# Patient Record
Sex: Female | Born: 1935 | Race: White | Hispanic: No | Smoking: Former smoker
Health system: Southern US, Community
[De-identification: ages and names within clinical notes are randomized; demographics above are authoritative.]

## PROBLEM LIST (undated history)

## (undated) ENCOUNTER — Ambulatory Visit: Discharge status: Absent without leave | Payer: BLUE CROSS/BLUE SHIELD

## (undated) DIAGNOSIS — C439 Malignant melanoma of skin, unspecified: Secondary | ICD-10-CM

## (undated) DIAGNOSIS — G4700 Insomnia, unspecified: Secondary | ICD-10-CM

## (undated) DIAGNOSIS — E119 Type 2 diabetes mellitus without complications: Secondary | ICD-10-CM

## (undated) DIAGNOSIS — D649 Anemia, unspecified: Secondary | ICD-10-CM

## (undated) DIAGNOSIS — C801 Malignant (primary) neoplasm, unspecified: Secondary | ICD-10-CM

## (undated) DIAGNOSIS — I340 Nonrheumatic mitral (valve) insufficiency: Secondary | ICD-10-CM

## (undated) DIAGNOSIS — I5030 Unspecified diastolic (congestive) heart failure: Secondary | ICD-10-CM

## (undated) DIAGNOSIS — E785 Hyperlipidemia, unspecified: Secondary | ICD-10-CM

## (undated) DIAGNOSIS — I809 Phlebitis and thrombophlebitis of unspecified site: Secondary | ICD-10-CM

## (undated) DIAGNOSIS — I639 Cerebral infarction, unspecified: Secondary | ICD-10-CM

## (undated) DIAGNOSIS — R011 Cardiac murmur, unspecified: Secondary | ICD-10-CM

## (undated) DIAGNOSIS — R112 Nausea with vomiting, unspecified: Secondary | ICD-10-CM

## (undated) DIAGNOSIS — Z9841 Cataract extraction status, right eye: Secondary | ICD-10-CM

## (undated) DIAGNOSIS — I700 Atherosclerosis of aorta: Secondary | ICD-10-CM

## (undated) DIAGNOSIS — N3281 Overactive bladder: Secondary | ICD-10-CM

## (undated) DIAGNOSIS — C44319 Basal cell carcinoma of skin of other parts of face: Secondary | ICD-10-CM

## (undated) DIAGNOSIS — I5189 Other ill-defined heart diseases: Secondary | ICD-10-CM

## (undated) DIAGNOSIS — I10 Essential (primary) hypertension: Secondary | ICD-10-CM

## (undated) DIAGNOSIS — I350 Nonrheumatic aortic (valve) stenosis: Secondary | ICD-10-CM

## (undated) DIAGNOSIS — C189 Malignant neoplasm of colon, unspecified: Secondary | ICD-10-CM

## (undated) DIAGNOSIS — I5032 Chronic diastolic (congestive) heart failure: Secondary | ICD-10-CM

## (undated) DIAGNOSIS — Z9889 Other specified postprocedural states: Secondary | ICD-10-CM

## (undated) DIAGNOSIS — I6529 Occlusion and stenosis of unspecified carotid artery: Secondary | ICD-10-CM

## (undated) DIAGNOSIS — C3490 Malignant neoplasm of unspecified part of unspecified bronchus or lung: Secondary | ICD-10-CM

## (undated) DIAGNOSIS — K921 Melena: Secondary | ICD-10-CM

## (undated) DIAGNOSIS — I779 Disorder of arteries and arterioles, unspecified: Secondary | ICD-10-CM

## (undated) DIAGNOSIS — K589 Irritable bowel syndrome without diarrhea: Secondary | ICD-10-CM

## (undated) DIAGNOSIS — D126 Benign neoplasm of colon, unspecified: Secondary | ICD-10-CM

## (undated) HISTORY — DX: Irritable bowel syndrome without diarrhea: K58.9

## (undated) HISTORY — PX: BREAST BIOPSY: SHX20

## (undated) HISTORY — PX: MELANOMA EXCISION: SHX5266

## (undated) HISTORY — DX: Hyperlipidemia, unspecified: E78.5

## (undated) HISTORY — DX: Malignant neoplasm of unspecified part of unspecified bronchus or lung: C34.90

## (undated) HISTORY — PX: SKIN SURGERY: SHX2413

## (undated) HISTORY — PX: BACK SURGERY: SHX140

## (undated) HISTORY — DX: Essential (primary) hypertension: I10

## (undated) HISTORY — DX: Malignant melanoma of skin, unspecified: C43.9

## (undated) HISTORY — PX: APPENDECTOMY: SHX54

## (undated) HISTORY — DX: Type 2 diabetes mellitus without complications: E11.9

## (undated) HISTORY — PX: BREAST SURGERY: SHX581

---
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
## 2004-12-29 ENCOUNTER — Ambulatory Visit: Payer: Self-pay | Admitting: Unknown Physician Specialty

## 2005-03-24 ENCOUNTER — Ambulatory Visit: Payer: Self-pay | Admitting: Family Medicine

## 2006-03-30 ENCOUNTER — Ambulatory Visit: Payer: Self-pay | Admitting: Family Medicine

## 2007-04-02 ENCOUNTER — Other Ambulatory Visit: Payer: Self-pay

## 2007-04-02 ENCOUNTER — Inpatient Hospital Stay: Payer: Self-pay | Admitting: Internal Medicine

## 2007-04-02 DIAGNOSIS — I6350 Cerebral infarction due to unspecified occlusion or stenosis of unspecified cerebral artery: Secondary | ICD-10-CM

## 2007-04-02 HISTORY — DX: Cerebral infarction due to unspecified occlusion or stenosis of unspecified cerebral artery: I63.50

## 2007-05-05 ENCOUNTER — Ambulatory Visit: Payer: Self-pay | Admitting: Family Medicine

## 2007-09-06 ENCOUNTER — Ambulatory Visit: Payer: Self-pay | Admitting: Family Medicine

## 2007-09-17 ENCOUNTER — Ambulatory Visit: Payer: Self-pay | Admitting: Family Medicine

## 2007-10-17 ENCOUNTER — Ambulatory Visit: Payer: Self-pay | Admitting: Family Medicine

## 2007-11-17 ENCOUNTER — Ambulatory Visit: Payer: Self-pay | Admitting: Family Medicine

## 2008-05-08 ENCOUNTER — Ambulatory Visit: Payer: Self-pay | Admitting: Family Medicine

## 2008-05-16 ENCOUNTER — Ambulatory Visit: Payer: Self-pay | Admitting: Internal Medicine

## 2008-06-08 ENCOUNTER — Ambulatory Visit: Payer: Self-pay | Admitting: Internal Medicine

## 2008-06-16 ENCOUNTER — Ambulatory Visit: Payer: Self-pay | Admitting: Internal Medicine

## 2008-06-21 ENCOUNTER — Ambulatory Visit: Payer: Self-pay | Admitting: Otolaryngology

## 2008-06-21 ENCOUNTER — Other Ambulatory Visit: Payer: Self-pay

## 2008-06-27 ENCOUNTER — Ambulatory Visit: Payer: Self-pay | Admitting: Otolaryngology

## 2008-07-05 ENCOUNTER — Ambulatory Visit: Payer: Self-pay | Admitting: Internal Medicine

## 2008-07-17 ENCOUNTER — Ambulatory Visit: Payer: Self-pay | Admitting: Internal Medicine

## 2008-08-16 ENCOUNTER — Ambulatory Visit: Payer: Self-pay | Admitting: Internal Medicine

## 2008-08-30 ENCOUNTER — Ambulatory Visit: Payer: Self-pay | Admitting: Internal Medicine

## 2008-09-16 ENCOUNTER — Ambulatory Visit: Payer: Self-pay | Admitting: Internal Medicine

## 2008-11-16 ENCOUNTER — Ambulatory Visit: Payer: Self-pay | Admitting: Internal Medicine

## 2008-11-29 ENCOUNTER — Ambulatory Visit: Payer: Self-pay | Admitting: Internal Medicine

## 2008-12-17 ENCOUNTER — Ambulatory Visit: Payer: Self-pay | Admitting: Internal Medicine

## 2008-12-25 ENCOUNTER — Ambulatory Visit: Payer: Self-pay | Admitting: Family Medicine

## 2009-05-07 ENCOUNTER — Ambulatory Visit: Payer: Self-pay | Admitting: Unknown Physician Specialty

## 2009-05-09 ENCOUNTER — Ambulatory Visit: Payer: Self-pay | Admitting: Family Medicine

## 2009-09-26 ENCOUNTER — Emergency Department: Payer: Self-pay | Admitting: Emergency Medicine

## 2009-10-07 ENCOUNTER — Ambulatory Visit: Payer: Self-pay | Admitting: Family Medicine

## 2010-03-03 ENCOUNTER — Ambulatory Visit: Payer: Self-pay | Admitting: Family Medicine

## 2010-05-13 ENCOUNTER — Ambulatory Visit: Payer: Self-pay | Admitting: Family Medicine

## 2010-11-16 HISTORY — PX: EYE SURGERY: SHX253

## 2010-11-16 HISTORY — PX: CATARACT EXTRACTION: SUR2

## 2011-05-25 ENCOUNTER — Ambulatory Visit: Payer: Self-pay | Admitting: Ophthalmology

## 2011-06-15 ENCOUNTER — Ambulatory Visit: Payer: Self-pay | Admitting: Family Medicine

## 2011-06-16 ENCOUNTER — Ambulatory Visit: Payer: Self-pay | Admitting: Ophthalmology

## 2011-09-29 ENCOUNTER — Ambulatory Visit: Payer: Self-pay | Admitting: Family Medicine

## 2011-10-21 ENCOUNTER — Ambulatory Visit: Payer: Self-pay | Admitting: Family Medicine

## 2011-10-22 ENCOUNTER — Ambulatory Visit: Payer: Self-pay | Admitting: Family Medicine

## 2011-11-30 DIAGNOSIS — R1031 Right lower quadrant pain: Secondary | ICD-10-CM | POA: Diagnosis not present

## 2012-01-11 DIAGNOSIS — I10 Essential (primary) hypertension: Secondary | ICD-10-CM | POA: Diagnosis not present

## 2012-01-11 DIAGNOSIS — I6350 Cerebral infarction due to unspecified occlusion or stenosis of unspecified cerebral artery: Secondary | ICD-10-CM | POA: Diagnosis not present

## 2012-01-11 DIAGNOSIS — G4700 Insomnia, unspecified: Secondary | ICD-10-CM | POA: Diagnosis not present

## 2012-01-28 DIAGNOSIS — H903 Sensorineural hearing loss, bilateral: Secondary | ICD-10-CM | POA: Diagnosis not present

## 2012-01-28 DIAGNOSIS — H6120 Impacted cerumen, unspecified ear: Secondary | ICD-10-CM | POA: Diagnosis not present

## 2012-02-29 DIAGNOSIS — Z0000 Encounter for general adult medical examination without abnormal findings: Secondary | ICD-10-CM | POA: Diagnosis not present

## 2012-02-29 DIAGNOSIS — E785 Hyperlipidemia, unspecified: Secondary | ICD-10-CM | POA: Diagnosis not present

## 2012-02-29 DIAGNOSIS — G4700 Insomnia, unspecified: Secondary | ICD-10-CM | POA: Diagnosis not present

## 2012-02-29 DIAGNOSIS — I10 Essential (primary) hypertension: Secondary | ICD-10-CM | POA: Diagnosis not present

## 2012-02-29 DIAGNOSIS — E119 Type 2 diabetes mellitus without complications: Secondary | ICD-10-CM | POA: Diagnosis not present

## 2012-03-15 ENCOUNTER — Ambulatory Visit: Payer: Self-pay | Admitting: Family Medicine

## 2012-03-15 DIAGNOSIS — M810 Age-related osteoporosis without current pathological fracture: Secondary | ICD-10-CM | POA: Diagnosis not present

## 2012-03-25 DIAGNOSIS — R748 Abnormal levels of other serum enzymes: Secondary | ICD-10-CM | POA: Diagnosis not present

## 2012-04-27 DIAGNOSIS — R110 Nausea: Secondary | ICD-10-CM | POA: Diagnosis not present

## 2012-04-27 DIAGNOSIS — R12 Heartburn: Secondary | ICD-10-CM | POA: Diagnosis not present

## 2012-04-27 DIAGNOSIS — R634 Abnormal weight loss: Secondary | ICD-10-CM | POA: Diagnosis not present

## 2012-04-29 DIAGNOSIS — E782 Mixed hyperlipidemia: Secondary | ICD-10-CM | POA: Diagnosis not present

## 2012-04-29 DIAGNOSIS — I6330 Cerebral infarction due to thrombosis of unspecified cerebral artery: Secondary | ICD-10-CM | POA: Diagnosis not present

## 2012-04-29 DIAGNOSIS — I359 Nonrheumatic aortic valve disorder, unspecified: Secondary | ICD-10-CM | POA: Diagnosis not present

## 2012-04-29 DIAGNOSIS — I10 Essential (primary) hypertension: Secondary | ICD-10-CM | POA: Diagnosis not present

## 2012-05-10 DIAGNOSIS — I359 Nonrheumatic aortic valve disorder, unspecified: Secondary | ICD-10-CM | POA: Diagnosis not present

## 2012-05-10 DIAGNOSIS — I209 Angina pectoris, unspecified: Secondary | ICD-10-CM | POA: Diagnosis not present

## 2012-05-12 ENCOUNTER — Ambulatory Visit: Payer: Self-pay | Admitting: Unknown Physician Specialty

## 2012-05-12 DIAGNOSIS — R112 Nausea with vomiting, unspecified: Secondary | ICD-10-CM | POA: Diagnosis not present

## 2012-05-12 DIAGNOSIS — K7689 Other specified diseases of liver: Secondary | ICD-10-CM | POA: Diagnosis not present

## 2012-05-12 DIAGNOSIS — R141 Gas pain: Secondary | ICD-10-CM | POA: Diagnosis not present

## 2012-05-12 DIAGNOSIS — R1011 Right upper quadrant pain: Secondary | ICD-10-CM | POA: Diagnosis not present

## 2012-05-12 DIAGNOSIS — R142 Eructation: Secondary | ICD-10-CM | POA: Diagnosis not present

## 2012-05-24 DIAGNOSIS — E785 Hyperlipidemia, unspecified: Secondary | ICD-10-CM | POA: Diagnosis not present

## 2012-05-24 DIAGNOSIS — R011 Cardiac murmur, unspecified: Secondary | ICD-10-CM | POA: Diagnosis not present

## 2012-05-24 DIAGNOSIS — I359 Nonrheumatic aortic valve disorder, unspecified: Secondary | ICD-10-CM | POA: Diagnosis not present

## 2012-05-24 DIAGNOSIS — I10 Essential (primary) hypertension: Secondary | ICD-10-CM | POA: Diagnosis not present

## 2012-05-30 ENCOUNTER — Ambulatory Visit: Payer: Self-pay | Admitting: Unknown Physician Specialty

## 2012-05-30 DIAGNOSIS — K219 Gastro-esophageal reflux disease without esophagitis: Secondary | ICD-10-CM | POA: Diagnosis not present

## 2012-05-30 DIAGNOSIS — Z79899 Other long term (current) drug therapy: Secondary | ICD-10-CM | POA: Diagnosis not present

## 2012-05-30 DIAGNOSIS — M810 Age-related osteoporosis without current pathological fracture: Secondary | ICD-10-CM | POA: Diagnosis not present

## 2012-05-30 DIAGNOSIS — K2940 Chronic atrophic gastritis without bleeding: Secondary | ICD-10-CM | POA: Diagnosis not present

## 2012-05-30 DIAGNOSIS — I079 Rheumatic tricuspid valve disease, unspecified: Secondary | ICD-10-CM | POA: Diagnosis not present

## 2012-05-30 DIAGNOSIS — Z8673 Personal history of transient ischemic attack (TIA), and cerebral infarction without residual deficits: Secondary | ICD-10-CM | POA: Diagnosis not present

## 2012-05-30 DIAGNOSIS — I059 Rheumatic mitral valve disease, unspecified: Secondary | ICD-10-CM | POA: Diagnosis not present

## 2012-05-30 DIAGNOSIS — Z8041 Family history of malignant neoplasm of ovary: Secondary | ICD-10-CM | POA: Diagnosis not present

## 2012-05-30 DIAGNOSIS — Z7982 Long term (current) use of aspirin: Secondary | ICD-10-CM | POA: Diagnosis not present

## 2012-05-30 DIAGNOSIS — Z86010 Personal history of colonic polyps: Secondary | ICD-10-CM | POA: Diagnosis not present

## 2012-05-30 DIAGNOSIS — R1011 Right upper quadrant pain: Secondary | ICD-10-CM | POA: Diagnosis not present

## 2012-05-30 DIAGNOSIS — I700 Atherosclerosis of aorta: Secondary | ICD-10-CM | POA: Diagnosis not present

## 2012-05-30 DIAGNOSIS — R110 Nausea: Secondary | ICD-10-CM | POA: Diagnosis not present

## 2012-05-30 DIAGNOSIS — R112 Nausea with vomiting, unspecified: Secondary | ICD-10-CM | POA: Diagnosis not present

## 2012-05-30 DIAGNOSIS — Z9109 Other allergy status, other than to drugs and biological substances: Secondary | ICD-10-CM | POA: Diagnosis not present

## 2012-05-30 DIAGNOSIS — E785 Hyperlipidemia, unspecified: Secondary | ICD-10-CM | POA: Diagnosis not present

## 2012-05-30 DIAGNOSIS — IMO0002 Reserved for concepts with insufficient information to code with codable children: Secondary | ICD-10-CM | POA: Diagnosis not present

## 2012-05-30 DIAGNOSIS — Z87891 Personal history of nicotine dependence: Secondary | ICD-10-CM | POA: Diagnosis not present

## 2012-05-30 DIAGNOSIS — I10 Essential (primary) hypertension: Secondary | ICD-10-CM | POA: Diagnosis not present

## 2012-05-30 DIAGNOSIS — K2980 Duodenitis without bleeding: Secondary | ICD-10-CM | POA: Diagnosis not present

## 2012-05-30 DIAGNOSIS — E119 Type 2 diabetes mellitus without complications: Secondary | ICD-10-CM | POA: Diagnosis not present

## 2012-05-30 DIAGNOSIS — E559 Vitamin D deficiency, unspecified: Secondary | ICD-10-CM | POA: Diagnosis not present

## 2012-05-30 DIAGNOSIS — Z8249 Family history of ischemic heart disease and other diseases of the circulatory system: Secondary | ICD-10-CM | POA: Diagnosis not present

## 2012-05-30 DIAGNOSIS — K2990 Gastroduodenitis, unspecified, without bleeding: Secondary | ICD-10-CM | POA: Diagnosis not present

## 2012-06-10 DIAGNOSIS — K8010 Calculus of gallbladder with chronic cholecystitis without obstruction: Secondary | ICD-10-CM | POA: Diagnosis not present

## 2012-06-22 ENCOUNTER — Ambulatory Visit: Payer: Self-pay | Admitting: Surgery

## 2012-06-22 DIAGNOSIS — K219 Gastro-esophageal reflux disease without esophagitis: Secondary | ICD-10-CM | POA: Diagnosis not present

## 2012-06-22 DIAGNOSIS — Z885 Allergy status to narcotic agent status: Secondary | ICD-10-CM | POA: Diagnosis not present

## 2012-06-22 DIAGNOSIS — I079 Rheumatic tricuspid valve disease, unspecified: Secondary | ICD-10-CM | POA: Diagnosis not present

## 2012-06-22 DIAGNOSIS — Z85828 Personal history of other malignant neoplasm of skin: Secondary | ICD-10-CM | POA: Diagnosis not present

## 2012-06-22 DIAGNOSIS — R634 Abnormal weight loss: Secondary | ICD-10-CM | POA: Diagnosis not present

## 2012-06-22 DIAGNOSIS — M810 Age-related osteoporosis without current pathological fracture: Secondary | ICD-10-CM | POA: Diagnosis not present

## 2012-06-22 DIAGNOSIS — Z86010 Personal history of colonic polyps: Secondary | ICD-10-CM | POA: Diagnosis not present

## 2012-06-22 DIAGNOSIS — Z880 Allergy status to penicillin: Secondary | ICD-10-CM | POA: Diagnosis not present

## 2012-06-22 DIAGNOSIS — Z882 Allergy status to sulfonamides status: Secondary | ICD-10-CM | POA: Diagnosis not present

## 2012-06-22 DIAGNOSIS — K8010 Calculus of gallbladder with chronic cholecystitis without obstruction: Secondary | ICD-10-CM | POA: Diagnosis not present

## 2012-06-22 DIAGNOSIS — Z881 Allergy status to other antibiotic agents status: Secondary | ICD-10-CM | POA: Diagnosis not present

## 2012-06-22 DIAGNOSIS — E559 Vitamin D deficiency, unspecified: Secondary | ICD-10-CM | POA: Diagnosis not present

## 2012-06-22 DIAGNOSIS — K8020 Calculus of gallbladder without cholecystitis without obstruction: Secondary | ICD-10-CM | POA: Diagnosis not present

## 2012-06-22 DIAGNOSIS — K811 Chronic cholecystitis: Secondary | ICD-10-CM | POA: Diagnosis not present

## 2012-06-22 DIAGNOSIS — I10 Essential (primary) hypertension: Secondary | ICD-10-CM | POA: Diagnosis not present

## 2012-06-22 DIAGNOSIS — J309 Allergic rhinitis, unspecified: Secondary | ICD-10-CM | POA: Diagnosis not present

## 2012-06-22 DIAGNOSIS — Z8673 Personal history of transient ischemic attack (TIA), and cerebral infarction without residual deficits: Secondary | ICD-10-CM | POA: Diagnosis not present

## 2012-06-22 DIAGNOSIS — G8918 Other acute postprocedural pain: Secondary | ICD-10-CM | POA: Diagnosis not present

## 2012-06-22 DIAGNOSIS — I080 Rheumatic disorders of both mitral and aortic valves: Secondary | ICD-10-CM | POA: Diagnosis not present

## 2012-06-22 DIAGNOSIS — E785 Hyperlipidemia, unspecified: Secondary | ICD-10-CM | POA: Diagnosis not present

## 2012-06-22 DIAGNOSIS — Z9889 Other specified postprocedural states: Secondary | ICD-10-CM | POA: Diagnosis not present

## 2012-06-22 DIAGNOSIS — R0902 Hypoxemia: Secondary | ICD-10-CM | POA: Diagnosis not present

## 2012-06-22 DIAGNOSIS — R011 Cardiac murmur, unspecified: Secondary | ICD-10-CM | POA: Diagnosis not present

## 2012-06-22 DIAGNOSIS — I739 Peripheral vascular disease, unspecified: Secondary | ICD-10-CM | POA: Diagnosis not present

## 2012-06-22 DIAGNOSIS — Z79899 Other long term (current) drug therapy: Secondary | ICD-10-CM | POA: Diagnosis not present

## 2012-06-23 DIAGNOSIS — K8010 Calculus of gallbladder with chronic cholecystitis without obstruction: Secondary | ICD-10-CM | POA: Diagnosis not present

## 2012-06-23 DIAGNOSIS — I739 Peripheral vascular disease, unspecified: Secondary | ICD-10-CM | POA: Diagnosis not present

## 2012-06-23 DIAGNOSIS — R0902 Hypoxemia: Secondary | ICD-10-CM | POA: Diagnosis not present

## 2012-06-23 DIAGNOSIS — Z9889 Other specified postprocedural states: Secondary | ICD-10-CM | POA: Diagnosis not present

## 2012-06-23 DIAGNOSIS — G8918 Other acute postprocedural pain: Secondary | ICD-10-CM | POA: Diagnosis not present

## 2012-06-23 DIAGNOSIS — R011 Cardiac murmur, unspecified: Secondary | ICD-10-CM | POA: Diagnosis not present

## 2012-06-27 HISTORY — PX: CHOLECYSTECTOMY: SHX55

## 2012-08-02 ENCOUNTER — Ambulatory Visit: Payer: Self-pay | Admitting: Family Medicine

## 2012-08-02 DIAGNOSIS — Z1231 Encounter for screening mammogram for malignant neoplasm of breast: Secondary | ICD-10-CM | POA: Diagnosis not present

## 2012-08-04 DIAGNOSIS — Z23 Encounter for immunization: Secondary | ICD-10-CM | POA: Diagnosis not present

## 2012-12-16 DIAGNOSIS — B356 Tinea cruris: Secondary | ICD-10-CM | POA: Diagnosis not present

## 2012-12-16 DIAGNOSIS — E119 Type 2 diabetes mellitus without complications: Secondary | ICD-10-CM | POA: Diagnosis not present

## 2012-12-16 DIAGNOSIS — E7800 Pure hypercholesterolemia, unspecified: Secondary | ICD-10-CM | POA: Diagnosis not present

## 2012-12-19 DIAGNOSIS — E785 Hyperlipidemia, unspecified: Secondary | ICD-10-CM | POA: Diagnosis not present

## 2012-12-19 DIAGNOSIS — E119 Type 2 diabetes mellitus without complications: Secondary | ICD-10-CM | POA: Diagnosis not present

## 2012-12-19 DIAGNOSIS — I10 Essential (primary) hypertension: Secondary | ICD-10-CM | POA: Diagnosis not present

## 2012-12-19 DIAGNOSIS — M810 Age-related osteoporosis without current pathological fracture: Secondary | ICD-10-CM | POA: Diagnosis not present

## 2013-01-11 DIAGNOSIS — E119 Type 2 diabetes mellitus without complications: Secondary | ICD-10-CM | POA: Diagnosis not present

## 2013-01-11 DIAGNOSIS — R112 Nausea with vomiting, unspecified: Secondary | ICD-10-CM | POA: Diagnosis not present

## 2013-01-11 DIAGNOSIS — K219 Gastro-esophageal reflux disease without esophagitis: Secondary | ICD-10-CM | POA: Diagnosis not present

## 2013-01-18 DIAGNOSIS — K219 Gastro-esophageal reflux disease without esophagitis: Secondary | ICD-10-CM | POA: Diagnosis not present

## 2013-01-18 DIAGNOSIS — R112 Nausea with vomiting, unspecified: Secondary | ICD-10-CM | POA: Diagnosis not present

## 2013-02-01 DIAGNOSIS — K5289 Other specified noninfective gastroenteritis and colitis: Secondary | ICD-10-CM | POA: Diagnosis not present

## 2013-02-01 DIAGNOSIS — R112 Nausea with vomiting, unspecified: Secondary | ICD-10-CM | POA: Diagnosis not present

## 2013-03-20 DIAGNOSIS — Z0000 Encounter for general adult medical examination without abnormal findings: Secondary | ICD-10-CM | POA: Diagnosis not present

## 2013-03-20 DIAGNOSIS — I10 Essential (primary) hypertension: Secondary | ICD-10-CM | POA: Diagnosis not present

## 2013-03-20 DIAGNOSIS — E119 Type 2 diabetes mellitus without complications: Secondary | ICD-10-CM | POA: Diagnosis not present

## 2013-03-20 DIAGNOSIS — E785 Hyperlipidemia, unspecified: Secondary | ICD-10-CM | POA: Diagnosis not present

## 2013-03-22 DIAGNOSIS — R748 Abnormal levels of other serum enzymes: Secondary | ICD-10-CM | POA: Diagnosis not present

## 2013-03-22 DIAGNOSIS — E119 Type 2 diabetes mellitus without complications: Secondary | ICD-10-CM | POA: Diagnosis not present

## 2013-03-22 DIAGNOSIS — E785 Hyperlipidemia, unspecified: Secondary | ICD-10-CM | POA: Diagnosis not present

## 2013-03-22 DIAGNOSIS — I10 Essential (primary) hypertension: Secondary | ICD-10-CM | POA: Diagnosis not present

## 2013-05-02 DIAGNOSIS — I10 Essential (primary) hypertension: Secondary | ICD-10-CM | POA: Diagnosis not present

## 2013-05-02 DIAGNOSIS — E785 Hyperlipidemia, unspecified: Secondary | ICD-10-CM | POA: Diagnosis not present

## 2013-05-02 DIAGNOSIS — Z0000 Encounter for general adult medical examination without abnormal findings: Secondary | ICD-10-CM | POA: Diagnosis not present

## 2013-05-02 DIAGNOSIS — E119 Type 2 diabetes mellitus without complications: Secondary | ICD-10-CM | POA: Diagnosis not present

## 2013-05-22 DIAGNOSIS — H43819 Vitreous degeneration, unspecified eye: Secondary | ICD-10-CM | POA: Diagnosis not present

## 2013-05-22 DIAGNOSIS — H2510 Age-related nuclear cataract, unspecified eye: Secondary | ICD-10-CM | POA: Diagnosis not present

## 2013-05-22 DIAGNOSIS — H524 Presbyopia: Secondary | ICD-10-CM | POA: Diagnosis not present

## 2013-06-01 DIAGNOSIS — C44319 Basal cell carcinoma of skin of other parts of face: Secondary | ICD-10-CM | POA: Diagnosis not present

## 2013-06-01 DIAGNOSIS — L570 Actinic keratosis: Secondary | ICD-10-CM | POA: Diagnosis not present

## 2013-06-19 DIAGNOSIS — R1012 Left upper quadrant pain: Secondary | ICD-10-CM | POA: Diagnosis not present

## 2013-06-19 DIAGNOSIS — E119 Type 2 diabetes mellitus without complications: Secondary | ICD-10-CM | POA: Diagnosis not present

## 2013-06-19 DIAGNOSIS — I10 Essential (primary) hypertension: Secondary | ICD-10-CM | POA: Diagnosis not present

## 2013-06-19 DIAGNOSIS — G4700 Insomnia, unspecified: Secondary | ICD-10-CM | POA: Diagnosis not present

## 2013-08-14 DIAGNOSIS — R1012 Left upper quadrant pain: Secondary | ICD-10-CM | POA: Diagnosis not present

## 2013-08-14 DIAGNOSIS — E785 Hyperlipidemia, unspecified: Secondary | ICD-10-CM | POA: Diagnosis not present

## 2013-08-14 DIAGNOSIS — I10 Essential (primary) hypertension: Secondary | ICD-10-CM | POA: Diagnosis not present

## 2013-08-14 DIAGNOSIS — E119 Type 2 diabetes mellitus without complications: Secondary | ICD-10-CM | POA: Diagnosis not present

## 2013-08-21 DIAGNOSIS — L905 Scar conditions and fibrosis of skin: Secondary | ICD-10-CM | POA: Diagnosis not present

## 2013-08-21 DIAGNOSIS — L570 Actinic keratosis: Secondary | ICD-10-CM | POA: Diagnosis not present

## 2013-08-21 DIAGNOSIS — L738 Other specified follicular disorders: Secondary | ICD-10-CM | POA: Diagnosis not present

## 2013-08-21 DIAGNOSIS — C44319 Basal cell carcinoma of skin of other parts of face: Secondary | ICD-10-CM | POA: Diagnosis not present

## 2013-08-22 ENCOUNTER — Ambulatory Visit: Payer: Self-pay | Admitting: Family Medicine

## 2013-08-22 DIAGNOSIS — Z1231 Encounter for screening mammogram for malignant neoplasm of breast: Secondary | ICD-10-CM | POA: Diagnosis not present

## 2013-09-16 DIAGNOSIS — K37 Unspecified appendicitis: Secondary | ICD-10-CM | POA: Diagnosis not present

## 2013-09-16 DIAGNOSIS — K3580 Unspecified acute appendicitis: Secondary | ICD-10-CM | POA: Diagnosis not present

## 2013-09-16 DIAGNOSIS — R1011 Right upper quadrant pain: Secondary | ICD-10-CM | POA: Diagnosis not present

## 2013-09-16 LAB — COMPREHENSIVE METABOLIC PANEL
Albumin: 4.5 g/dL (ref 3.4–5.0)
BUN: 13 mg/dL (ref 7–18)
Bilirubin,Total: 0.7 mg/dL (ref 0.2–1.0)
Calcium, Total: 10.2 mg/dL — ABNORMAL HIGH (ref 8.5–10.1)
Chloride: 102 mmol/L (ref 98–107)
Co2: 28 mmol/L (ref 21–32)
Glucose: 108 mg/dL — ABNORMAL HIGH (ref 65–99)
SGOT(AST): 31 U/L (ref 15–37)
Sodium: 135 mmol/L — ABNORMAL LOW (ref 136–145)

## 2013-09-16 LAB — CBC
HGB: 16.3 g/dL — ABNORMAL HIGH (ref 12.0–16.0)
MCH: 30.1 pg (ref 26.0–34.0)
MCHC: 34.3 g/dL (ref 32.0–36.0)
RBC: 5.4 10*6/uL — ABNORMAL HIGH (ref 3.80–5.20)
RDW: 12.8 % (ref 11.5–14.5)

## 2013-09-16 LAB — URINALYSIS, COMPLETE
Bilirubin,UR: NEGATIVE
Glucose,UR: NEGATIVE mg/dL (ref 0–75)
Leukocyte Esterase: NEGATIVE
Protein: NEGATIVE
RBC,UR: <1 /HPF (ref 0–5)
Squamous Epithelial: NONE SEEN

## 2013-09-17 ENCOUNTER — Inpatient Hospital Stay: Payer: Self-pay | Admitting: Surgery

## 2013-09-17 DIAGNOSIS — R109 Unspecified abdominal pain: Secondary | ICD-10-CM | POA: Diagnosis not present

## 2013-09-17 DIAGNOSIS — K219 Gastro-esophageal reflux disease without esophagitis: Secondary | ICD-10-CM | POA: Diagnosis not present

## 2013-09-17 DIAGNOSIS — E119 Type 2 diabetes mellitus without complications: Secondary | ICD-10-CM | POA: Diagnosis not present

## 2013-09-17 DIAGNOSIS — I739 Peripheral vascular disease, unspecified: Secondary | ICD-10-CM | POA: Diagnosis not present

## 2013-09-17 DIAGNOSIS — Z8673 Personal history of transient ischemic attack (TIA), and cerebral infarction without residual deficits: Secondary | ICD-10-CM | POA: Diagnosis not present

## 2013-09-17 DIAGNOSIS — Z79899 Other long term (current) drug therapy: Secondary | ICD-10-CM | POA: Diagnosis not present

## 2013-09-17 DIAGNOSIS — K3580 Unspecified acute appendicitis: Secondary | ICD-10-CM | POA: Diagnosis present

## 2013-09-17 DIAGNOSIS — M810 Age-related osteoporosis without current pathological fracture: Secondary | ICD-10-CM | POA: Diagnosis not present

## 2014-03-12 DIAGNOSIS — L570 Actinic keratosis: Secondary | ICD-10-CM | POA: Diagnosis not present

## 2014-03-12 DIAGNOSIS — L821 Other seborrheic keratosis: Secondary | ICD-10-CM | POA: Diagnosis not present

## 2014-03-12 DIAGNOSIS — Z85828 Personal history of other malignant neoplasm of skin: Secondary | ICD-10-CM | POA: Diagnosis not present

## 2014-03-13 DIAGNOSIS — R1012 Left upper quadrant pain: Secondary | ICD-10-CM | POA: Diagnosis not present

## 2014-03-13 DIAGNOSIS — G4440 Drug-induced headache, not elsewhere classified, not intractable: Secondary | ICD-10-CM | POA: Diagnosis not present

## 2014-03-13 DIAGNOSIS — E119 Type 2 diabetes mellitus without complications: Secondary | ICD-10-CM | POA: Diagnosis not present

## 2014-03-13 DIAGNOSIS — E785 Hyperlipidemia, unspecified: Secondary | ICD-10-CM | POA: Diagnosis not present

## 2014-03-15 DIAGNOSIS — R1012 Left upper quadrant pain: Secondary | ICD-10-CM | POA: Diagnosis not present

## 2014-03-15 DIAGNOSIS — G4700 Insomnia, unspecified: Secondary | ICD-10-CM | POA: Diagnosis not present

## 2014-03-15 DIAGNOSIS — G4452 New daily persistent headache (NDPH): Secondary | ICD-10-CM | POA: Diagnosis not present

## 2014-03-15 DIAGNOSIS — E119 Type 2 diabetes mellitus without complications: Secondary | ICD-10-CM | POA: Diagnosis not present

## 2014-03-15 DIAGNOSIS — N3090 Cystitis, unspecified without hematuria: Secondary | ICD-10-CM | POA: Diagnosis not present

## 2014-03-23 ENCOUNTER — Ambulatory Visit: Payer: Self-pay | Admitting: Family Medicine

## 2014-03-23 DIAGNOSIS — R51 Headache: Secondary | ICD-10-CM | POA: Diagnosis not present

## 2014-04-02 DIAGNOSIS — I10 Essential (primary) hypertension: Secondary | ICD-10-CM | POA: Diagnosis not present

## 2014-04-02 DIAGNOSIS — E7800 Pure hypercholesterolemia, unspecified: Secondary | ICD-10-CM | POA: Diagnosis not present

## 2014-04-02 DIAGNOSIS — R1012 Left upper quadrant pain: Secondary | ICD-10-CM | POA: Diagnosis not present

## 2014-04-02 DIAGNOSIS — Z0000 Encounter for general adult medical examination without abnormal findings: Secondary | ICD-10-CM | POA: Diagnosis not present

## 2014-04-02 DIAGNOSIS — E119 Type 2 diabetes mellitus without complications: Secondary | ICD-10-CM | POA: Diagnosis not present

## 2014-04-02 DIAGNOSIS — Z23 Encounter for immunization: Secondary | ICD-10-CM | POA: Diagnosis not present

## 2014-04-06 DIAGNOSIS — E785 Hyperlipidemia, unspecified: Secondary | ICD-10-CM | POA: Diagnosis not present

## 2014-04-06 DIAGNOSIS — E119 Type 2 diabetes mellitus without complications: Secondary | ICD-10-CM | POA: Diagnosis not present

## 2014-04-12 DIAGNOSIS — M719 Bursopathy, unspecified: Secondary | ICD-10-CM | POA: Diagnosis not present

## 2014-04-12 DIAGNOSIS — M67919 Unspecified disorder of synovium and tendon, unspecified shoulder: Secondary | ICD-10-CM | POA: Diagnosis not present

## 2014-04-23 DIAGNOSIS — I6529 Occlusion and stenosis of unspecified carotid artery: Secondary | ICD-10-CM | POA: Diagnosis not present

## 2014-04-23 DIAGNOSIS — E785 Hyperlipidemia, unspecified: Secondary | ICD-10-CM | POA: Diagnosis not present

## 2014-04-23 DIAGNOSIS — E669 Obesity, unspecified: Secondary | ICD-10-CM | POA: Diagnosis not present

## 2014-04-23 DIAGNOSIS — I10 Essential (primary) hypertension: Secondary | ICD-10-CM | POA: Diagnosis not present

## 2014-07-30 DIAGNOSIS — D485 Neoplasm of uncertain behavior of skin: Secondary | ICD-10-CM | POA: Diagnosis not present

## 2014-07-30 DIAGNOSIS — L820 Inflamed seborrheic keratosis: Secondary | ICD-10-CM | POA: Diagnosis not present

## 2014-07-30 DIAGNOSIS — L570 Actinic keratosis: Secondary | ICD-10-CM | POA: Diagnosis not present

## 2014-07-30 DIAGNOSIS — Z85828 Personal history of other malignant neoplasm of skin: Secondary | ICD-10-CM | POA: Diagnosis not present

## 2014-08-08 DIAGNOSIS — L570 Actinic keratosis: Secondary | ICD-10-CM | POA: Diagnosis not present

## 2014-09-04 ENCOUNTER — Ambulatory Visit: Payer: Self-pay | Admitting: Family Medicine

## 2014-09-04 DIAGNOSIS — Z1231 Encounter for screening mammogram for malignant neoplasm of breast: Secondary | ICD-10-CM | POA: Diagnosis not present

## 2014-09-11 DIAGNOSIS — M7551 Bursitis of right shoulder: Secondary | ICD-10-CM | POA: Diagnosis not present

## 2014-09-11 DIAGNOSIS — M533 Sacrococcygeal disorders, not elsewhere classified: Secondary | ICD-10-CM | POA: Diagnosis not present

## 2014-10-25 DIAGNOSIS — J069 Acute upper respiratory infection, unspecified: Secondary | ICD-10-CM | POA: Diagnosis not present

## 2014-12-03 DIAGNOSIS — E785 Hyperlipidemia, unspecified: Secondary | ICD-10-CM | POA: Diagnosis not present

## 2014-12-03 DIAGNOSIS — I10 Essential (primary) hypertension: Secondary | ICD-10-CM | POA: Diagnosis not present

## 2014-12-03 DIAGNOSIS — E669 Obesity, unspecified: Secondary | ICD-10-CM | POA: Diagnosis not present

## 2014-12-03 DIAGNOSIS — I6529 Occlusion and stenosis of unspecified carotid artery: Secondary | ICD-10-CM | POA: Diagnosis not present

## 2014-12-03 DIAGNOSIS — I639 Cerebral infarction, unspecified: Secondary | ICD-10-CM | POA: Diagnosis not present

## 2014-12-04 ENCOUNTER — Ambulatory Visit: Payer: Self-pay | Admitting: Vascular Surgery

## 2014-12-04 DIAGNOSIS — I6501 Occlusion and stenosis of right vertebral artery: Secondary | ICD-10-CM | POA: Diagnosis not present

## 2014-12-04 DIAGNOSIS — Z01818 Encounter for other preprocedural examination: Secondary | ICD-10-CM | POA: Diagnosis not present

## 2014-12-04 DIAGNOSIS — I6523 Occlusion and stenosis of bilateral carotid arteries: Secondary | ICD-10-CM | POA: Diagnosis not present

## 2014-12-13 DIAGNOSIS — E785 Hyperlipidemia, unspecified: Secondary | ICD-10-CM | POA: Diagnosis not present

## 2014-12-13 DIAGNOSIS — I639 Cerebral infarction, unspecified: Secondary | ICD-10-CM | POA: Diagnosis not present

## 2014-12-13 DIAGNOSIS — I6529 Occlusion and stenosis of unspecified carotid artery: Secondary | ICD-10-CM | POA: Diagnosis not present

## 2014-12-13 DIAGNOSIS — I10 Essential (primary) hypertension: Secondary | ICD-10-CM | POA: Diagnosis not present

## 2014-12-13 DIAGNOSIS — E669 Obesity, unspecified: Secondary | ICD-10-CM | POA: Diagnosis not present

## 2014-12-15 DIAGNOSIS — R262 Difficulty in walking, not elsewhere classified: Secondary | ICD-10-CM | POA: Diagnosis not present

## 2014-12-15 DIAGNOSIS — R42 Dizziness and giddiness: Secondary | ICD-10-CM | POA: Diagnosis not present

## 2014-12-15 DIAGNOSIS — G459 Transient cerebral ischemic attack, unspecified: Secondary | ICD-10-CM | POA: Diagnosis not present

## 2014-12-15 DIAGNOSIS — I10 Essential (primary) hypertension: Secondary | ICD-10-CM | POA: Diagnosis not present

## 2014-12-15 DIAGNOSIS — I639 Cerebral infarction, unspecified: Secondary | ICD-10-CM | POA: Diagnosis not present

## 2014-12-15 DIAGNOSIS — R51 Headache: Secondary | ICD-10-CM | POA: Diagnosis not present

## 2014-12-15 DIAGNOSIS — I214 Non-ST elevation (NSTEMI) myocardial infarction: Secondary | ICD-10-CM | POA: Diagnosis not present

## 2014-12-15 DIAGNOSIS — I6782 Cerebral ischemia: Secondary | ICD-10-CM | POA: Diagnosis not present

## 2014-12-15 DIAGNOSIS — E785 Hyperlipidemia, unspecified: Secondary | ICD-10-CM | POA: Diagnosis not present

## 2014-12-15 DIAGNOSIS — R7989 Other specified abnormal findings of blood chemistry: Secondary | ICD-10-CM | POA: Diagnosis not present

## 2014-12-15 LAB — COMPREHENSIVE METABOLIC PANEL
ANION GAP: 7 (ref 7–16)
Albumin: 4 g/dL (ref 3.4–5.0)
Alkaline Phosphatase: 67 U/L (ref 46–116)
BILIRUBIN TOTAL: 0.4 mg/dL (ref 0.2–1.0)
BUN: 15 mg/dL (ref 7–18)
CHLORIDE: 104 mmol/L (ref 98–107)
CO2: 31 mmol/L (ref 21–32)
Calcium, Total: 9.9 mg/dL (ref 8.5–10.1)
Creatinine: 0.94 mg/dL (ref 0.60–1.30)
Glucose: 167 mg/dL — ABNORMAL HIGH (ref 65–99)
Osmolality: 288 (ref 275–301)
POTASSIUM: 3.4 mmol/L — AB (ref 3.5–5.1)
SGOT(AST): 34 U/L (ref 15–37)
SGPT (ALT): 40 U/L (ref 14–63)
SODIUM: 142 mmol/L (ref 136–145)
Total Protein: 7.6 g/dL (ref 6.4–8.2)

## 2014-12-15 LAB — PROTIME-INR
INR: 1
PROTHROMBIN TIME: 13.6 s

## 2014-12-15 LAB — URINALYSIS, COMPLETE
BLOOD: NEGATIVE
Bilirubin,UR: NEGATIVE
GLUCOSE, UR: NEGATIVE mg/dL (ref 0–75)
Ketone: NEGATIVE
Leukocyte Esterase: NEGATIVE
Nitrite: NEGATIVE
PH: 8 (ref 4.5–8.0)
Protein: NEGATIVE
RBC,UR: NONE SEEN /HPF (ref 0–5)
SPECIFIC GRAVITY: 1.01 (ref 1.003–1.030)
WBC UR: <1 /HPF (ref 0–5)

## 2014-12-15 LAB — CBC
HCT: 45.4 % (ref 35.0–47.0)
HGB: 15.7 g/dL (ref 12.0–16.0)
MCH: 30.4 pg (ref 26.0–34.0)
MCHC: 34.5 g/dL (ref 32.0–36.0)
MCV: 88 fL (ref 80–100)
Platelet: 149 10*3/uL — ABNORMAL LOW (ref 150–440)
RBC: 5.15 10*6/uL (ref 3.80–5.20)
RDW: 13 % (ref 11.5–14.5)
WBC: 9 10*3/uL (ref 3.6–11.0)

## 2014-12-15 LAB — TROPONIN I: TROPONIN-I: 0.06 ng/mL — AB

## 2014-12-15 LAB — CK TOTAL AND CKMB (NOT AT ARMC)
CK, TOTAL: 144 U/L (ref 26–192)
CK-MB: 3.8 ng/mL — ABNORMAL HIGH (ref 0.5–3.6)

## 2014-12-15 LAB — APTT: Activated PTT: 27.4 secs (ref 23.6–35.9)

## 2014-12-16 ENCOUNTER — Inpatient Hospital Stay: Payer: Self-pay | Admitting: Internal Medicine

## 2014-12-16 DIAGNOSIS — I6529 Occlusion and stenosis of unspecified carotid artery: Secondary | ICD-10-CM | POA: Diagnosis not present

## 2014-12-16 DIAGNOSIS — R262 Difficulty in walking, not elsewhere classified: Secondary | ICD-10-CM | POA: Diagnosis not present

## 2014-12-16 DIAGNOSIS — I739 Peripheral vascular disease, unspecified: Secondary | ICD-10-CM | POA: Diagnosis not present

## 2014-12-16 DIAGNOSIS — I63131 Cerebral infarction due to embolism of right carotid artery: Secondary | ICD-10-CM | POA: Diagnosis present

## 2014-12-16 DIAGNOSIS — I743 Embolism and thrombosis of arteries of the lower extremities: Secondary | ICD-10-CM | POA: Diagnosis not present

## 2014-12-16 DIAGNOSIS — Z882 Allergy status to sulfonamides status: Secondary | ICD-10-CM | POA: Diagnosis not present

## 2014-12-16 DIAGNOSIS — R51 Headache: Secondary | ICD-10-CM | POA: Diagnosis not present

## 2014-12-16 DIAGNOSIS — I69393 Ataxia following cerebral infarction: Secondary | ICD-10-CM | POA: Diagnosis not present

## 2014-12-16 DIAGNOSIS — M6281 Muscle weakness (generalized): Secondary | ICD-10-CM | POA: Diagnosis not present

## 2014-12-16 DIAGNOSIS — I69398 Other sequelae of cerebral infarction: Secondary | ICD-10-CM | POA: Diagnosis not present

## 2014-12-16 DIAGNOSIS — I6502 Occlusion and stenosis of left vertebral artery: Secondary | ICD-10-CM | POA: Diagnosis not present

## 2014-12-16 DIAGNOSIS — R7989 Other specified abnormal findings of blood chemistry: Secondary | ICD-10-CM | POA: Diagnosis not present

## 2014-12-16 DIAGNOSIS — I651 Occlusion and stenosis of basilar artery: Secondary | ICD-10-CM | POA: Diagnosis not present

## 2014-12-16 DIAGNOSIS — I10 Essential (primary) hypertension: Secondary | ICD-10-CM | POA: Diagnosis not present

## 2014-12-16 DIAGNOSIS — I6782 Cerebral ischemia: Secondary | ICD-10-CM | POA: Diagnosis not present

## 2014-12-16 DIAGNOSIS — E785 Hyperlipidemia, unspecified: Secondary | ICD-10-CM | POA: Diagnosis not present

## 2014-12-16 DIAGNOSIS — I639 Cerebral infarction, unspecified: Secondary | ICD-10-CM | POA: Diagnosis not present

## 2014-12-16 DIAGNOSIS — I6789 Other cerebrovascular disease: Secondary | ICD-10-CM | POA: Diagnosis not present

## 2014-12-16 DIAGNOSIS — I089 Rheumatic multiple valve disease, unspecified: Secondary | ICD-10-CM | POA: Diagnosis not present

## 2014-12-16 DIAGNOSIS — K219 Gastro-esophageal reflux disease without esophagitis: Secondary | ICD-10-CM | POA: Diagnosis not present

## 2014-12-16 DIAGNOSIS — Z880 Allergy status to penicillin: Secondary | ICD-10-CM | POA: Diagnosis not present

## 2014-12-16 DIAGNOSIS — R260 Ataxic gait: Secondary | ICD-10-CM | POA: Diagnosis present

## 2014-12-16 DIAGNOSIS — Z87891 Personal history of nicotine dependence: Secondary | ICD-10-CM | POA: Diagnosis not present

## 2014-12-16 DIAGNOSIS — I6523 Occlusion and stenosis of bilateral carotid arteries: Secondary | ICD-10-CM | POA: Diagnosis not present

## 2014-12-16 DIAGNOSIS — Z7982 Long term (current) use of aspirin: Secondary | ICD-10-CM | POA: Diagnosis not present

## 2014-12-16 DIAGNOSIS — Z8249 Family history of ischemic heart disease and other diseases of the circulatory system: Secondary | ICD-10-CM | POA: Diagnosis not present

## 2014-12-16 DIAGNOSIS — Z8673 Personal history of transient ischemic attack (TIA), and cerebral infarction without residual deficits: Secondary | ICD-10-CM | POA: Diagnosis not present

## 2014-12-16 DIAGNOSIS — E780 Pure hypercholesterolemia: Secondary | ICD-10-CM | POA: Diagnosis present

## 2014-12-16 DIAGNOSIS — R42 Dizziness and giddiness: Secondary | ICD-10-CM | POA: Diagnosis not present

## 2014-12-16 DIAGNOSIS — I248 Other forms of acute ischemic heart disease: Secondary | ICD-10-CM | POA: Diagnosis present

## 2014-12-16 DIAGNOSIS — I6340 Cerebral infarction due to embolism of unspecified cerebral artery: Secondary | ICD-10-CM | POA: Diagnosis not present

## 2014-12-16 DIAGNOSIS — I63132 Cerebral infarction due to embolism of left carotid artery: Secondary | ICD-10-CM | POA: Diagnosis present

## 2014-12-16 DIAGNOSIS — I214 Non-ST elevation (NSTEMI) myocardial infarction: Secondary | ICD-10-CM | POA: Diagnosis not present

## 2014-12-16 HISTORY — DX: Cerebral infarction, unspecified: I63.9

## 2014-12-16 LAB — CBC WITH DIFFERENTIAL/PLATELET
Basophil #: 0 10*3/uL (ref 0.0–0.1)
Basophil %: 0.6 %
EOS PCT: 1.6 %
Eosinophil #: 0.1 10*3/uL (ref 0.0–0.7)
HCT: 42.1 % (ref 35.0–47.0)
HGB: 14.2 g/dL (ref 12.0–16.0)
Lymphocyte #: 3.3 10*3/uL (ref 1.0–3.6)
Lymphocyte %: 40.1 %
MCH: 30.1 pg (ref 26.0–34.0)
MCHC: 33.8 g/dL (ref 32.0–36.0)
MCV: 89 fL (ref 80–100)
Monocyte #: 0.7 x10 3/mm (ref 0.2–0.9)
Monocyte %: 8.5 %
Neutrophil #: 4 10*3/uL (ref 1.4–6.5)
Neutrophil %: 49.2 %
PLATELETS: 138 10*3/uL — AB (ref 150–440)
RBC: 4.71 10*6/uL (ref 3.80–5.20)
RDW: 13 % (ref 11.5–14.5)
WBC: 8.1 10*3/uL (ref 3.6–11.0)

## 2014-12-16 LAB — BASIC METABOLIC PANEL
Anion Gap: 7 (ref 7–16)
BUN: 15 mg/dL (ref 7–18)
CREATININE: 0.94 mg/dL (ref 0.60–1.30)
Calcium, Total: 9.1 mg/dL (ref 8.5–10.1)
Chloride: 107 mmol/L (ref 98–107)
Co2: 28 mmol/L (ref 21–32)
GLUCOSE: 121 mg/dL — AB (ref 65–99)
Osmolality: 285 (ref 275–301)
POTASSIUM: 3.4 mmol/L — AB (ref 3.5–5.1)
Sodium: 142 mmol/L (ref 136–145)

## 2014-12-16 LAB — LIPID PANEL
CHOLESTEROL: 177 mg/dL (ref 0–200)
HDL: 38 mg/dL — AB (ref 40–60)
LDL CHOLESTEROL, CALC: 92 mg/dL (ref 0–100)
TRIGLYCERIDES: 233 mg/dL — AB (ref 0–200)
VLDL CHOLESTEROL, CALC: 47 mg/dL — AB (ref 5–40)

## 2014-12-16 LAB — TROPONIN I
Troponin-I: 0.07 ng/mL — ABNORMAL HIGH
Troponin-I: 0.07 ng/mL — ABNORMAL HIGH

## 2014-12-17 LAB — POTASSIUM: POTASSIUM: 3.3 mmol/L — AB (ref 3.5–5.1)

## 2014-12-17 LAB — MAGNESIUM: Magnesium: 2.2 mg/dL

## 2014-12-19 ENCOUNTER — Encounter: Payer: Self-pay | Admitting: Internal Medicine

## 2014-12-19 DIAGNOSIS — E785 Hyperlipidemia, unspecified: Secondary | ICD-10-CM | POA: Diagnosis not present

## 2014-12-19 DIAGNOSIS — M6281 Muscle weakness (generalized): Secondary | ICD-10-CM | POA: Diagnosis not present

## 2014-12-19 DIAGNOSIS — I6529 Occlusion and stenosis of unspecified carotid artery: Secondary | ICD-10-CM | POA: Diagnosis not present

## 2014-12-19 DIAGNOSIS — I6340 Cerebral infarction due to embolism of unspecified cerebral artery: Secondary | ICD-10-CM | POA: Diagnosis not present

## 2014-12-19 DIAGNOSIS — Z01812 Encounter for preprocedural laboratory examination: Secondary | ICD-10-CM | POA: Diagnosis not present

## 2014-12-19 DIAGNOSIS — Z8673 Personal history of transient ischemic attack (TIA), and cerebral infarction without residual deficits: Secondary | ICD-10-CM | POA: Diagnosis not present

## 2014-12-19 DIAGNOSIS — I69398 Other sequelae of cerebral infarction: Secondary | ICD-10-CM | POA: Diagnosis not present

## 2014-12-19 DIAGNOSIS — I639 Cerebral infarction, unspecified: Secondary | ICD-10-CM | POA: Diagnosis not present

## 2014-12-19 DIAGNOSIS — Z7982 Long term (current) use of aspirin: Secondary | ICD-10-CM | POA: Diagnosis not present

## 2014-12-19 DIAGNOSIS — M542 Cervicalgia: Secondary | ICD-10-CM | POA: Diagnosis not present

## 2014-12-19 DIAGNOSIS — I739 Peripheral vascular disease, unspecified: Secondary | ICD-10-CM | POA: Diagnosis not present

## 2014-12-19 DIAGNOSIS — Z79899 Other long term (current) drug therapy: Secondary | ICD-10-CM | POA: Diagnosis not present

## 2014-12-19 DIAGNOSIS — R7989 Other specified abnormal findings of blood chemistry: Secondary | ICD-10-CM | POA: Diagnosis not present

## 2014-12-19 DIAGNOSIS — K219 Gastro-esophageal reflux disease without esophagitis: Secondary | ICD-10-CM | POA: Diagnosis not present

## 2014-12-19 DIAGNOSIS — I69393 Ataxia following cerebral infarction: Secondary | ICD-10-CM | POA: Diagnosis not present

## 2014-12-19 DIAGNOSIS — I10 Essential (primary) hypertension: Secondary | ICD-10-CM | POA: Diagnosis not present

## 2014-12-20 DIAGNOSIS — I10 Essential (primary) hypertension: Secondary | ICD-10-CM | POA: Diagnosis not present

## 2014-12-20 DIAGNOSIS — I639 Cerebral infarction, unspecified: Secondary | ICD-10-CM | POA: Diagnosis not present

## 2015-01-02 ENCOUNTER — Ambulatory Visit: Payer: Self-pay | Admitting: Vascular Surgery

## 2015-01-02 DIAGNOSIS — Z79899 Other long term (current) drug therapy: Secondary | ICD-10-CM | POA: Diagnosis not present

## 2015-01-02 DIAGNOSIS — I6529 Occlusion and stenosis of unspecified carotid artery: Secondary | ICD-10-CM | POA: Diagnosis not present

## 2015-01-02 DIAGNOSIS — Z01812 Encounter for preprocedural laboratory examination: Secondary | ICD-10-CM | POA: Diagnosis not present

## 2015-01-02 DIAGNOSIS — Z8673 Personal history of transient ischemic attack (TIA), and cerebral infarction without residual deficits: Secondary | ICD-10-CM | POA: Diagnosis not present

## 2015-01-02 DIAGNOSIS — I10 Essential (primary) hypertension: Secondary | ICD-10-CM | POA: Diagnosis not present

## 2015-01-02 DIAGNOSIS — E785 Hyperlipidemia, unspecified: Secondary | ICD-10-CM | POA: Diagnosis not present

## 2015-01-14 DIAGNOSIS — I6529 Occlusion and stenosis of unspecified carotid artery: Secondary | ICD-10-CM | POA: Diagnosis not present

## 2015-01-14 DIAGNOSIS — I639 Cerebral infarction, unspecified: Secondary | ICD-10-CM | POA: Diagnosis not present

## 2015-01-15 ENCOUNTER — Encounter
Admit: 2015-01-15 | Disposition: A | Discharge status: Home / Self Care | Payer: Self-pay | Attending: Internal Medicine | Admitting: Internal Medicine

## 2015-01-17 DIAGNOSIS — I639 Cerebral infarction, unspecified: Secondary | ICD-10-CM | POA: Diagnosis not present

## 2015-01-17 DIAGNOSIS — Z823 Family history of stroke: Secondary | ICD-10-CM | POA: Diagnosis not present

## 2015-01-18 ENCOUNTER — Inpatient Hospital Stay: Payer: Self-pay | Admitting: Vascular Surgery

## 2015-01-18 DIAGNOSIS — Z7982 Long term (current) use of aspirin: Secondary | ICD-10-CM | POA: Diagnosis not present

## 2015-01-18 DIAGNOSIS — E119 Type 2 diabetes mellitus without complications: Secondary | ICD-10-CM | POA: Diagnosis present

## 2015-01-18 DIAGNOSIS — I7090 Unspecified atherosclerosis: Secondary | ICD-10-CM | POA: Diagnosis not present

## 2015-01-18 DIAGNOSIS — Z882 Allergy status to sulfonamides status: Secondary | ICD-10-CM | POA: Diagnosis not present

## 2015-01-18 DIAGNOSIS — Z880 Allergy status to penicillin: Secondary | ICD-10-CM | POA: Diagnosis not present

## 2015-01-18 DIAGNOSIS — I10 Essential (primary) hypertension: Secondary | ICD-10-CM | POA: Diagnosis present

## 2015-01-18 DIAGNOSIS — I6522 Occlusion and stenosis of left carotid artery: Secondary | ICD-10-CM | POA: Diagnosis not present

## 2015-01-18 DIAGNOSIS — Z8673 Personal history of transient ischemic attack (TIA), and cerebral infarction without residual deficits: Secondary | ICD-10-CM | POA: Diagnosis not present

## 2015-01-18 DIAGNOSIS — I2510 Atherosclerotic heart disease of native coronary artery without angina pectoris: Secondary | ICD-10-CM | POA: Diagnosis present

## 2015-01-18 DIAGNOSIS — I739 Peripheral vascular disease, unspecified: Secondary | ICD-10-CM | POA: Diagnosis present

## 2015-01-18 DIAGNOSIS — I6529 Occlusion and stenosis of unspecified carotid artery: Secondary | ICD-10-CM | POA: Diagnosis not present

## 2015-01-18 DIAGNOSIS — Z85828 Personal history of other malignant neoplasm of skin: Secondary | ICD-10-CM | POA: Diagnosis not present

## 2015-01-18 DIAGNOSIS — R51 Headache: Secondary | ICD-10-CM | POA: Diagnosis not present

## 2015-01-18 DIAGNOSIS — I959 Hypotension, unspecified: Secondary | ICD-10-CM | POA: Diagnosis not present

## 2015-01-18 HISTORY — PX: CAROTID ENDARTERECTOMY: SUR193

## 2015-01-18 HISTORY — PX: CAROTID ARTERY ANGIOPLASTY: SHX1300

## 2015-02-05 DIAGNOSIS — G4700 Insomnia, unspecified: Secondary | ICD-10-CM | POA: Diagnosis not present

## 2015-02-05 DIAGNOSIS — J209 Acute bronchitis, unspecified: Secondary | ICD-10-CM | POA: Diagnosis not present

## 2015-02-13 DIAGNOSIS — E782 Mixed hyperlipidemia: Secondary | ICD-10-CM | POA: Diagnosis not present

## 2015-02-13 DIAGNOSIS — I10 Essential (primary) hypertension: Secondary | ICD-10-CM | POA: Diagnosis not present

## 2015-02-13 DIAGNOSIS — I6340 Cerebral infarction due to embolism of unspecified cerebral artery: Secondary | ICD-10-CM | POA: Diagnosis not present

## 2015-02-13 DIAGNOSIS — I6529 Occlusion and stenosis of unspecified carotid artery: Secondary | ICD-10-CM | POA: Diagnosis not present

## 2015-02-15 ENCOUNTER — Encounter
Admit: 2015-02-15 | Disposition: A | Discharge status: Home / Self Care | Payer: Self-pay | Attending: Internal Medicine | Admitting: Internal Medicine

## 2015-02-18 DIAGNOSIS — I480 Paroxysmal atrial fibrillation: Secondary | ICD-10-CM | POA: Diagnosis not present

## 2015-02-22 DIAGNOSIS — I471 Supraventricular tachycardia, unspecified: Secondary | ICD-10-CM

## 2015-02-22 HISTORY — DX: Supraventricular tachycardia, unspecified: I47.10

## 2015-03-05 NOTE — Op Note (Signed)
PATIENT NAME:  [name], [name] MR#:  [id number] DATE OF BIRTH:  08/18/1929  DATE OF PROCEDURE:  06/22/2012  PREOPERATIVE DIAGNOSIS: Chronic cholecystitis, cholelithiasis.   POSTOPERATIVE DIAGNOSIS: Chronic cholecystitis, cholelithiasis.  PROCEDURE: Laparoscopic cholecystectomy, cholangiogram.   SURGEON: Loreli Dollar, MD  ANESTHESIA: General.   INDICATION: This 81 year old female has a recent history of right upper quadrant abdominal pain which is moderately severe and eventually resolved. She had another pain two days prior to exam. She had ultrasound findings of fine particulate matter within the gallbladder and surgery was recommended for definitive treatment.   DESCRIPTION OF PROCEDURE: The patient was placed on the operating table in the supine position under general endotracheal anesthesia. The abdomen was prepared with ChloraPrep, draped in a sterile manner.   A short incision was made just below the umbilicus and dissection was carried down to the deep fascia which was grasped with a laryngeal hook and elevated. A Veress needle was inserted, aspirated, and irrigated with a saline solution. Next, the peritoneal cavity was inflated with carbon dioxide. The Veress needle was removed. The 10 mm cannula was inserted. The 10 mm, 0 degree laparoscope was inserted to view the peritoneal cavity. The liver appeared smooth. There was no gross abnormality seen in the omentum and what small bit of colon was identified. Another incision was made in the epigastrium to the right of the midline to introduce an 11 mm cannula. Two incisions were made in the lateral aspect of the right upper quadrant to introduce two 5 mm cannulas.   The gallbladder was retracted towards the right shoulder. The infundibulum was retracted inferiorly and laterally. The porta hepatis was demonstrated. Dissection was carried out mobilizing the neck of the gallbladder by incision of the visceral peritoneum. The cystic duct was  dissected free from surrounding structures. The cystic artery was dissected free from surrounding structures. A critical view of safety was demonstrated. An endoclip was placed across the cystic duct adjacent to the neck of the gallbladder. An incision was made in the cystic duct to introduce a Reddick catheter. Half-strength Conray-60 dye was injected as the cholangiogram was done with fluoroscopy demonstrating the biliary tree and flow of dye into the duodenum. The second film showed just the tiniest lucency at the distal common duct but a second film showed no lucency in this area. Three images were saved. The Reddick catheter was removed. The cystic duct was doubly ligated with endoclips and divided. The cystic artery was controlled with double endoclips and divided. The gallbladder was dissected free from the liver with hook and cautery. There is very scant bleeding. Hemostasis subsequently intact. The gallbladder was delivered up through the infraumbilical incision, opened and suctioned, removed and noted fine particulate matter within the gallbladder. It was submitted in formalin for routine pathology. The right upper quadrant was further inspected. Hemostasis was intact. The cannulas were removed. Carbon dioxide allowed to escape from the peritoneal cavity. The fascial defect at the umbilicus was closed with a 0 Vicryl figure-of-eight suture. The skin incisions were closed with interrupted 5-0 chromic subcuticular sutures, benzoin, and Steri-Strips. Dressings were applied with paper tape.   Patient did have some hypotension which was treated and subsequently resolved and was stable at the end of the procedure and was prepared for transfer to the recovery room.  ____________________________ Lenna Sciara. Rochel Brome, MD jws:cms D: 06/22/2012 08:48:51 ET T: 06/22/2012 10:57:19 ET JOB#: [id number]  cc: Loreli Dollar, MD, <Dictator> Loreli Dollar MD ELECTRONICALLY SIGNED 06/24/2012 19:45

## 2015-03-08 NOTE — H&P (Signed)
Subjective/Chief Complaint RLQ abdominal pain   History of Present Illness 81 y/o female with acute onset of pain at 6 am this morning which has progressed over course of day.  No very severe.  No  nausea, emesis, diarrhea, no sick contacts.  w/u in ER c/w acute appendicitis.   Past History TIA Cholelithiasis   Past Med/Surgical Hx:  Osteoporosis:   PVD - Peripheral Vascular Disease:   GERD - Esophageal Reflux:   allergies:   osteopenia:   TIA:   hyperlipidemia:   HTN:   Diverticulosis:   Cataract Extraction:   ALLERGIES:  Sulfa drugs: N/V  Macrobid: N/V  Codeine: N/V  PCN: Swelling  Cephalosporins: Rash  HOME MEDICATIONS: Medication Instructions Status  Fish Oil oral capsule 1 cap(s) orally 2 times a day Active  aspirin 81 mg oral tablet 1 tab(s) orally once a day  Active  amlodipine 5 mg oral tablet 1 tab(s) orally once a day (at bedtime) Active  pantoprazole 40 mg oral delayed release tablet 1 tab(s) orally 2 times a day Active  hydrochlorothiazide 12.5 mg oral tablet 1 tab(s) orally once a day Active  oxybutynin 5 mg oral tablet 1 tab(s) orally once a day Active  temazepam 15 mg oral capsule 1 cap(s) orally once a day (at bedtime) Active  MiraLax oral powder for reconstitution  orally every other day Active  magnesium 250 mg oral tablet 1 tab(s) orally once a day Active  Tylenol as needed  Active   Family and Social History:  Family History Non-Contributory   Social History negative tobacco, negative ETOH   Place of Living Home   Review of Systems:  Subjective/Chief Complaint see above   Abdominal Pain Yes   Dysuria No   Tolerating Diet No   Medications/Allergies Reviewed Medications/Allergies reviewed   Physical Exam:  GEN no acute distress, ill appearing,temp 98.2, p88, 135/78   HEENT pale conjunctivae, PERRL, moist oral mucosa, good dentition   NECK supple   RESP normal resp effort  clear BS   CARD regular rate   ABD positive tenderness   distended  focal RLQ peritonitis.   EXTR negative cyanosis/clubbing, negative edema   SKIN normal to palpation, No rashes   NEURO cranial nerves intact   PSYCH A+O to time, place, person   Lab Results:  Hepatic:  01-Nov-14 19:02   Bilirubin, Total 0.7  Alkaline Phosphatase 87  SGPT (ALT) 35  SGOT (AST) 31  Total Protein, Serum  8.4  Albumin, Serum 4.5  Routine Chem:  01-Nov-14 19:02   Lipase 91 (Result(s) reported on 16 Sep 2013 at 07:41PM.)  Glucose, Serum  108  BUN 13  Creatinine (comp) 0.82  Sodium, Serum  135  Potassium, Serum  3.3  Chloride, Serum 102  CO2, Serum 28  Calcium (Total), Serum  10.2  Osmolality (calc) 271  eGFR (African American) >60  eGFR (Non-African American) >60 (eGFR values <33m/min/1.73 m2 may be an indication of chronic kidney disease (CKD). Calculated eGFR is useful in patients with stable renal function. The eGFR calculation will not be reliable in acutely ill patients when serum creatinine is changing rapidly. It is not useful in  patients on dialysis. The eGFR calculation may not be applicable to patients at the low and high extremes of body sizes, pregnant women, and vegetarians.)  Anion Gap  5  Routine Hem:  01-Nov-14 19:02   WBC (CBC)  13.3  RBC (CBC)  5.40  Hemoglobin (CBC)  16.3  Hematocrit (CBC)  47.4  Platelet Count (CBC) 165 (Result(s) reported on 16 Sep 2013 at 07:32PM.)  MCV 88  MCH 30.1  MCHC 34.3  RDW 12.8   Radiology Results: LabUnknown:    01-Nov-14 19:18, CT Abdomen and Pelvis Without Contrast  PACS Image  CT:  CT Abdomen and Pelvis Without Contrast  REASON FOR EXAM:    (1) severe RLQ pain; (2) severe RLQ pain  COMMENTS:   May transport without cardiac monitor    PROCEDURE: CT  - CT ABDOMEN AND PELVIS W0  - Sep 16 2013  7:18PM     RESULT: CT of the abdomen and pelvis is performed without contrastand   reconstructed at 3 mm slice thickness in the axial plane. There is no   previous study for  comparison.    Images through the base the lungs demonstrate normal linear fibrosis or   atelectasis and dependent atelectasis. Noncontrast images of theliver,   spleen, adrenal glands and pancreas are unremarkable. The kidneys show no   hydronephrosis or nephrolithiasis. There is some minimal perinephric   stranding present. There is no adenopathy. Prominent atherosclerotic   calcification is seen in the aorto iliac system. There is calcification     within the mitral annulus. Diffuse diverticulosis is seen. Urinary   bladder and uterus appear to be unremarkable. There is no significant   free fluid. No adnexal mass is evident.    There is abnormal distention of the appendix to approximately 8.2 mm on   image 94 with significant periappendiceal inflammatory stranding near the   distal portion of the appendix which appears to be on image #102 and 103.   There is no evidence of perforation or abscess formation. The abdominal   wall appears intact. There is degenerative disc narrowing at L4-L5 and   L5-S1 with degenerative endplate changes at S1 and inferiorly on L5.    IMPRESSION:   1. Findings of acute appendicitis. Surgical consultation is recommended.   There is no abscess formation or perforation. There does not appear to be   significant ascites.  2. Significant diffuse colonic diverticulosis without evidence of acute   diverticulitis.  3. Atherosclerotic calcification area  4. Status post cholecystectomy.    Dictation Site: 6        Verified By: Sundra Aland, M.D., MD    Assessment/Admission Diagnosis 81 y/o with acute appendicitis   Plan npo, lap appendectomy.   Electronic Signatures: Sherri Rad (MD)  (Signed [phone] 20:41)  Authored: CHIEF COMPLAINT and HISTORY, PAST MEDICAL/SURGIAL HISTORY, ALLERGIES, HOME MEDICATIONS, FAMILY AND SOCIAL HISTORY, REVIEW OF SYSTEMS, PHYSICAL EXAM, LABS, Radiology, ASSESSMENT AND PLAN   Last Updated: 01-Nov-14 20:41 by Sherri Rad (MD)

## 2015-03-08 NOTE — Op Note (Signed)
PATIENT NAME:  [name], [name] MR#:  [id number] DATE OF BIRTH:  02/24/1929  DATE OF PROCEDURE:  09/16/2013  PREOPERATIVE DIAGNOSIS:  Acute appendicitis.   POSTOPERATIVE DIAGNOSIS:  Acute appendicitis.  PROCEDURE PERFORMED:  Laparoscopic appendectomy.   SURGEON:  Sherri Rad, M.D.   ASSISTANT:  None.   ANESTHESIA:  General endotracheal, Dr[facility].   FINDINGS:  Early acute appendicitis, nonperforated.   SPECIMENS:  Appendix to pathology.   ESTIMATED BLOOD LOSS:  25 mL.   DESCRIPTION OF PROCEDURE:  With informed consent, supine position, general oral endotracheal anesthesia was induced.  Foley catheter was placed and SCDs were applied.  The patient's abdomen was widely prepped and draped utilizing ChloraPrep solution.  Both arms are tucked at her side.  Timeout was observed.  A 12 mm blunt Hassan trocar was placed through an open technique through an infraumbilical transverse oriented skin incision was fashioned through previous scar.  Stay sutures were passed through the abdominal wall fascia.  Pneumoperitoneum was established.  Laparoscopic evaluation of the abdomen demonstrated an inflamed appendix in the right lower quadrant.  A 5 mm Bladeless trocar was placed in the right upper quadrant.  An additional 5 mm Bladeless trocar was placed in the left suprapubic midline.  The appendix was grasped and elevated towards the anterior abdominal wall.  A window was fashioned at the base of the appendix.  Utilizing the articulating 45 mm endoscopic GIA stapler with bowel load the appendix was transected with a lip of cecum.  The mesoappendix was then sequentially divided between three fires of the white load of the stapler.  The specimen was captured in an Endo Catch device and retrieved.  The right lower quadrant was then copiously irrigated with 1 liter of normal saline and aspirated dry and hemostasis appeared to be adequate on the operative field.  Ports were then removed under  direct visualization and a total of 30 mL of 0.25% plain Marcaine was infiltrated along all skin and fascial incisions prior to closure.   The infraumbilical fascial defect was closed with an additional figure-of-eight #0 Vicryl suture in vertical orientation.  4-0 Vicryl subcuticular was applied to all skin edges followed by benzoin, Steri-Strips, Telfa and Tegaderm.  The patient was then subsequently extubated and taken to the recovery room in stable and satisfactory condition by anesthesia services.     ____________________________ Jeannette How Marina Gravel, MD Ricke Hey D: 09/16/2013 23:49:08 ET T: 09/17/2013 01:13:43 ET JOB#: [id number]  cc: Elta Guadeloupe A. Marina Gravel, MD, <Dictator> [name] Belfast, MD Kiano Terrien Bettina Gavia MD ELECTRONICALLY SIGNED 09/17/2013 8:55

## 2015-03-08 NOTE — Discharge Summary (Signed)
PATIENT NAME:  [name], [name] MR#:  [id number] DATE OF BIRTH:  Apr 05, 1929  DATE OF ADMISSION:  09/17/2013 DATE OF DISCHARGE:  09/18/2013  FINAL DIAGNOSIS: Acute appendicitis.   HOSPITAL COURSE SUMMARY: The patient was admitted with acute suppurative appendicitis. She underwent a laparoscopic appendectomy on the evening of November 1st. She tolerated this well. Suppurative findings were encountered. No perforation was evident. On postoperative day #1, the patient was doing very well with adequate pain control and was deemed suitable for discharge on November 3rd.    DISCHARGE MEDICATIONS: Can be included in the reconciliation form.   DISCHARGE FOLLOWUP:  She will follow up in my office in 1 to 2 weeks.   ____________________________ Jeannette How Marina Gravel, MD mab:cs D: 10/01/2013 16:26:42 ET T: 10/01/2013 19:47:44 ET JOB#: [id number]  cc: Elta Guadeloupe A. Marina Gravel, MD, <Dictator> Shirley Decamp A Loyce Klasen MD ELECTRONICALLY SIGNED 10/02/2013 21:26

## 2015-03-11 DIAGNOSIS — I10 Essential (primary) hypertension: Secondary | ICD-10-CM | POA: Diagnosis not present

## 2015-03-11 DIAGNOSIS — E782 Mixed hyperlipidemia: Secondary | ICD-10-CM | POA: Diagnosis not present

## 2015-03-11 DIAGNOSIS — I6529 Occlusion and stenosis of unspecified carotid artery: Secondary | ICD-10-CM | POA: Diagnosis not present

## 2015-03-11 LAB — SURGICAL PATHOLOGY

## 2015-03-17 NOTE — H&P (Signed)
PATIENT NAME:  [name], [name] MR#:  [id number] DATE OF BIRTH:  1929/06/25  DATE OF ADMISSION:  12/15/2014  REFERRING PHYSICIAN: Brunilda Payor A. Edd Fabian, MD  PRIMARY CARE PHYSICIAN: [name] Belfast, MD    CHIEF COMPLAINT: Weakness.   HISTORY OF PRESENT ILLNESS: An 81 year old Caucasian female with a history of TIA; hyperlipidemia, unspecified; hypertension, essential with known carotid stenosis with 80% bilateral, presenting with acute weakness. She describes symptoms mainly as sudden onset of weakness as a "wobbly" gait with associated headache. Denies any frank weakness, ataxia, or further symptomatology. All symptoms have resolved by the time she presented to the hospital. These originally occurred around about 10 hours or so prior to arrival to the Emergency Department.   REVIEW OF SYSTEMS:  CONSTITUTIONAL: Denies fevers, chills, fatigue. Positive for weakness.  EYES: Denies blurry vision, double vision, eye pain.  EARS, NOSE, THROAT: Denies tinnitus, ear pain, hearing loss.  RESPIRATORY: Denies cough, wheeze, shortness of breath.  CARDIOVASCULAR: Denies chest pain, palpitations, edema. GASTROINTESTINAL: Denies nausea, vomiting, diarrhea, or abdominal pain.  GENITOURINARY: Denies dysuria or hematuria.  ENDOCRINE: Denies nocturia or thyroid problems. HEMATOLOGIC: Denies easy bruising or bleeding.  SKIN: Denies rash or lesion.  MUSCULOSKELETAL: Denies pain in neck, back, shoulder, knees, hips or arthritic symptoms.  NEUROLOGIC: Positive for "wobbly" gait; however, denies any numbness, dysarthria, ataxia, or further symptomatology. PSYCHIATRIC: Denies anxiety or depressive symptoms.  Otherwise, full review of systems performed by me is negative.   PAST MEDICAL HISTORY: Peripheral vascular disease, known high-grade bilateral internal carotid artery stenosis about 80% bilaterally; gastroesophageal reflux disease without esophagitis; history of TIA; hyperlipidemia, unspecified; hypertension,  essential.   SOCIAL HISTORY: No alcohol, tobacco, or drug usage. Fully independent for activities of daily living.   FAMILY HISTORY: Positive for coronary artery disease.   ALLERGIES: CARBAPENEM, CEPHALOSPORIN, CODEINE, MACROBID, PENICILLIN, AND SULFA DRUGS.   HOME MEDICATIONS: Include aspirin 81 mg p.o. q. daily, pravastatin 20 mg p.o. q. daily, temazepam 15 mg p.o. q. daily, Norvasc 5 mg p.o. q. daily, hydrochlorothiazide 12.5 mg p.o. q. daily, MiraLax as needed for constipation, fish oil 1 capsule p.o. 3 times daily, oxybutynin 5 mg p.o. b.i.d., vitamin D3, 1000 international units p.o. q. daily.   PHYSICAL EXAMINATION:  VITAL SIGNS: Temperature 98.3, heart rate 88, respirations 14, blood pressure 161/81, saturating 100% on room air. Weight 64 kg, BMI 25.8.  GENERAL: A frail-appearing Caucasian female currently in no acute distress.  HEAD: Normocephalic, atraumatic.  EYES: Pupils equal, round, reactive to light. Extraocular muscles intact. No scleral icterus.  MOUTH: Moist mucosal membranes. Dentition intact. No abscess noted. EARS, NOSE, THROAT: Clear, without exudates. No external lesions.  NECK: Supple. No thyromegaly. No nodules. No JVD.  PULMONARY: Clear to auscultation bilaterally without wheezes, rales, rhonchi. No use of accessory muscles. Good respiratory effort.  CHEST: Nontender to palpation.  CARDIOVASCULAR: S1, S2, regular rate and rhythm. No murmurs, rubs, or gallops. No edema. Pedal pulses 2+.  GASTROINTESTINAL: Soft, nontender, nondistended. No masses. Positive bowel sounds. No hepatosplenomegaly.  MUSCULOSKELETAL: No swelling, clubbing, or edema. Range of motion full in all extremities.  NEUROLOGIC: Cranial nerves II through XII intact. No gross focal neurological deficits. Sensation intact. Reflexes intact. Pronator drift within normal limits. Dysdiadochokinesis within normal limits. Finger-to-nose within normal limits. Heel-to-shin within normal limits. Strength 5/5 in  all extremities including proximal and distal flexion and extension.  SKIN: No ulceration, lesions, rashes, or cyanosis. Skin warm, dry. Turgor intact.  PSYCHIATRIC: Mood and affect within normal limits. Patient awake, alert,  oriented x 3. Insight and judgment intact.   LABORATORY DATA: EKG performed reveals no evidence of ST or T wave abnormalities. Sodium 142, potassium 3.4, chloride 104, bicarbonate of 31, BUN 15, creatinine 0.94, glucose 167. LFTs within normal limits. Troponin 0.06. WBC of 9, hemoglobin of 15.7, platelets of 149,000. Urinalysis with no evidence of infection. Chest x-ray performed which reveals no acute cardiopulmonary process. CT, head, performed which reveals generalized atrophy; however, no acute intracranial process.   ASSESSMENT AND PLAN: An 81 year old Caucasian female with a history of transient ischemic attack; hyperlipidemia, unspecified; hypertension, essential; and known carotid stenosis, high-grade bilateral internal carotid artery stenosis about 80% bilaterally; presenting with sudden onset of weakness described as "wobbly" gait as well as associated headache. 1.  Transient ischemic attack: Admit to telemetry. Initiate aspirin and statin therapy. Will check an MRI. She has known carotid stenosis. check echocardiogram. Neurological checks q. 4 hours.  2.  Elevated troponin: Place on telemetry. Trend cardiac enzymes x 3. Initiate aspirin and statin therapy. Would avoid anticoagulation given potential for stroke.  3.  Hyperlipidemia, unspecified: Continue with statin therapy.  4.  Hypertension, essential: Permissive hypertension now. Hold Norvasc and hydrochlorothiazide the first 24 hours after transient ischemic attack symptoms.  5.  Venous thromboembolism prophylaxis with sequential compression devices.  CODE STATUS: The patient is full code.   TIME SPENT: 45 minutes.    ____________________________ Aaron Mose. Rosaleigh Brazzel, MD dkh:ST D: 12/15/2014 22:50:31  ET T: 12/15/2014 23:42:53 ET JOB#: [id number]  cc: Aaron Mose. Korene Dula, MD, <Dictator> Joany Khatib Woodfin Ganja MD ELECTRONICALLY SIGNED 12/16/2014 1:11

## 2015-03-17 NOTE — Consult Note (Signed)
   Present Illness 81 yo female with bilateral carotid artery disease with 80% stenosis on the left. She was being evaluated for posible carotid endarterectomy and was scheduled for outpatient risk stratification for surgery. She was admitted with instability and weakness. Echo revealed normal lv function with no high grade valvular disease. She underwent a lexiscan sestimibi which was negative for ischemia. Preserved lv funciton. Brain MRI revealed bilateral focal ischemic sites suggestive of embolic events from ascending aorta vs cardiac souirce. Transthoracic echo did not reveal any cardiac source of emboli. TEE scheduled to better evaluate for cardiac source of emboli. Pt had mild serum troponin elevation but does not appear to have acs given symptoms and funcitonal study and echo.   Physical Exam:  GEN well nourished   HEENT hearing intact to voice, moist oral mucosa   NECK No masses   RESP clear BS  no use of accessory muscles   CARD Regular rate and rhythm  Normal, S1, S2  Murmur   Murmur Systolic   Systolic Murmur axilla   ABD denies tenderness  no Adominal Mass   LYMPH negative neck, negative axillae   EXTR negative cyanosis/clubbing, negative edema   SKIN normal to palpation   NEURO cranial nerves intact, motor/sensory function intact   PSYCH A+O to time, place, person   Review of Systems:  Subjective/Chief Complaint bilateral weakness and fatigue   General: Fatigue  Weakness   Skin: No Complaints   ENT: No Complaints   Eyes: No Complaints   Neck: No Complaints   Respiratory: No Complaints   Cardiovascular: No Complaints   Gastrointestinal: No Complaints   Genitourinary: No Complaints   Vascular: No Complaints   Musculoskeletal: No Complaints   Neurologic: bilateral weakness   Hematologic: No Complaints   Endocrine: No Complaints   Psychiatric: No Complaints   Review of Systems: All other systems were reviewed and found to be negative    Medications/Allergies Reviewed Medications/Allergies reviewed   EKG:  EKG NSR   Abnormal NSSTTW changes    Sulfa drugs: N/V  Macrobid: N/V  Codeine: N/V  PCN: Swelling  Cephalosporins: Rash  Carbapenems: Hives   Impression 81 yo female with history hypertension and carotid artery disease being worked up for possible left cea. Admitted with bilateral weakness . She had no obvious cardiopulmonary disease on cxr. Echo revealed normal lv funciton with no cardiac source of emboli. She underwent sestimibi to risk stratifiy for possible cea which showed no ischemia. Brain mri revealed diffuse probable embolic lesions suggestive of possible cardiac or arotic souirce. TEE scheudlued to evaluate for cardiac source of emboli. This is planned for today. Mildly elevated serum troponin does not appear to represent acs.   Plan 1. Continue withpravastatin, asa 2. Defer chronic anticoagulation and/or dual antiplatelet therapy until after tee 3. Further recs after tee   Electronic Signatures: Teodoro Spray (MD)  (Signed 02-Feb-16 08:06)  Authored: General Aspect/Present Illness, History and Physical Exam, Review of System, EKG , Allergies, Impression/Plan   Last Updated: 02-Feb-16 08:06 by Teodoro Spray (MD)

## 2015-03-17 NOTE — Consult Note (Signed)
PATIENT NAME:  [name], [name] MR#:  [id number] DATE OF BIRTH:  Jun 10, 1929  DATE OF CONSULTATION:  12/16/2014  REFERRING PHYSICIAN:   CONSULTING PHYSICIAN:  Leotis Pain, MD  REASON FOR CONSULTATION: Strokes and wobbly gait.  HISTORY OF PRESENT ILLNESS: An 81 year old Caucasian female with past medical history of TIAs, hyperlipidemia, hypertension, carotid stenosis, was being worked up for carotid endarterectomy on the left side as near total occlusion on the left side, at which the patient presented with 1 day history of ataxic gait and difficulty ambulating without assistance. The patient is status post imaging and the patient has multiple bilateral infarcts. At baseline, the patient is on aspirin 81 mg and states that she is compliant with it.  REVIEW OF SYSTEMS: Denies any fever, chills. Positive for generalized fatigue. Positive for ataxic gait. No heat or cold intolerance. Denies any diarrhea or constipation. No new weakness on one side of the body compared to the other. Denies anxiety and depression.   ALLERGIES: INCLUDE CARBAPENEM, CEPHALOSPORIN, CODEINE, MACROBID, PENICILLIN, SULFA DRUGS.   HOME MEDICATIONS: Have been reviewed.  IMAGING: MRI shows multiple embolic looking like infarcts, bilateral hemispheres anterior and posterior circulation.   NEUROLOGIC EVALUATION: The patient is alert, awake, oriented to time, place, location, and the reason why she is in the hospital. Facial sensation intact. Facial motor is intact. Tongue is midline. Uvula elevates symmetrically. Shoulder shrug intact. Motor 5/5 bilateral upper and lower extremities. Coordination: Finger-to-nose slightly diminished bilaterally, but the patient is able to do it slowly bilaterally. Gait is ataxic and difficult to ambulate without assistance.   IMPRESSION: An 81 year old Caucasian female with bilateral embolic looking like strokes. The patient was on aspirin prior to admission. I am not convinced that all these  strokes are from the carotid arteries, specifically because some of them are in the posterior circulation.   PLAN: Physical therapy and occupational therapy. We will obtain CTA angiogram on the patient of head and neck. Status post 2-D echocardiogram, waiting for those results. If that is negative, we will strongly consider TEE to look for possible PFO, as this is suspected to be cardioembolic source. If above all workup negative, would consider outpatient Holter monitor. This case was discussed with the patient's family at bedside.   Thank you, it was a pleasure seeing this patient.   ____________________________ Leotis Pain, MD yz:TT D: 12/16/2014 13:32:24 ET T: 12/16/2014 15:31:16 ET JOB#: [id number]  cc: Leotis Pain, MD, <Dictator> Leotis Pain MD ELECTRONICALLY SIGNED 12/25/2014 12:08

## 2015-03-17 NOTE — Consult Note (Signed)
General Aspect abnormality of gait   Present Illness The patietn is an 81 year old female with a history of TIA; hyperlipidemia, unspecified; hypertension, essential with known carotid stenosis with 80% of the left ICA, presenting with acute bilateral weakness. She describes symptoms mainly as sudden onset of weakness as a ???wobbly??? gait with associated headache. Denies any frank weakness, ataxia, or further symptomatology. All symptoms have resolved by the time she presented to the hospital. These originally occurred around about 10 hours or so prior to arrival to the Emergency Department. Over the past 24 hours the weakness has improved but not resolved.  Family is present and both the patient and family deny any focal motor changes.  No slurred speach.   Home Medications: Medication Instructions Status  Fish Oil oral capsule 1 cap(s) orally 3 times a day Active  aspirin 81 mg oral tablet 1 tab(s) orally 2 times a day Active  temazepam 15 mg oral capsule 1 cap(s) orally once a day (at bedtime) Active  Vitamin D3 1000 intl units oral capsule 1 cap(s) orally once a day Active  hydrochlorothiazide 12.5 mg oral tablet 1 tab(s) orally once a day Active  oxybutynin 5 mg oral tablet 1 tab(s) orally 2 times a day Active  Norvasc 5 mg oral tablet 1 tab(s) orally once a day Active  pravastatin 20 mg oral tablet 1 tab(s) orally once a day (at bedtime) Active  MiraLax - oral powder for reconstitution 1 cap(s) orally 1 to 4 times a day, As Needed Active    Sulfa drugs: N/V  Macrobid: N/V  Codeine: N/V  PCN: Swelling  Cephalosporins: Rash  Carbapenems: Hives  Case History:  Family History Non-Contributory   Social History negative tobacco, negative ETOH, negative Illicit drugs   Review of Systems:  ROS No TIA/stroke/seizure No heat or cold intolerance No dysuria/hematuria No blurry or double vision No tinnitus or ear pain No rashes or ulcer No suicidal ideation or psychosis No signs  of bleeding or easy bruising No SOB/DOE, orthopnea, or sputum No palpitations or chest pain No N/V/D or abdominal pain No joint pain or joint swelling No fever or chills No unintentional weight loss or gain   Physical Exam:  GEN well developed, well nourished, no acute distress   HEENT hearing intact to voice, moist oral mucosa   NECK supple  trachea midline   RESP normal resp effort  no use of accessory muscles   CARD regular rate  no JVD   ABD denies tenderness  soft   EXTR negative cyanosis/clubbing, negative edema   SKIN No rashes, No ulcers   NEURO cranial nerves intact, follows commands   PSYCH alert, A+O to time, place, person   Nursing/Ancillary Notes: **Vital Signs.:   31-Jan-16 05:23  Vital Signs Type Routine  Temperature Temperature (F) 98.8  Celsius 37.1  Temperature Source oral  Respirations Respirations 18  Systolic BP Systolic BP 917  Diastolic BP (mmHg) Diastolic BP (mmHg) 65  Mean BP 84  Pulse Ox % Pulse Ox % 94  Pulse Ox Activity Level  At rest  Oxygen Delivery Room Air/ 21 %    11:48  Vital Signs Type Routine  Temperature Temperature (F) 97.9  Celsius 36.6  Temperature Source oral  Pulse Pulse 71  Respirations Respirations 18  Systolic BP Systolic BP 915  Diastolic BP (mmHg) Diastolic BP (mmHg) 77  Mean BP 91  Pulse Ox % Pulse Ox % 95  Pulse Ox Activity Level  At rest  Oxygen Delivery  Room Air/ 21 %   LabObservation:  31-Jan-16 09:46   OBSERVATION Reason for Test  Routine Chem:  31-Jan-16 03:15   Result Comment TROPONIN - RESULTS VERIFIED BY REPEAT TESTING.  - ELEVATED TROPONIN PREVIOUSLY CALLED AT  - 2004 12/15/14.PMH  Result(s) reported on 16 Dec 2014 at 04:23AM.  Cholesterol, Serum 177  Triglycerides, Serum  233  HDL (INHOUSE)  38  VLDL Cholesterol Calculated  47  LDL Cholesterol Calculated 92 (Result(s) reported on 16 Dec 2014 at 03:55AM.)  Glucose, Serum  121  BUN 15  Creatinine (comp) 0.94  Sodium, Serum 142   Potassium, Serum  3.4  Chloride, Serum 107  CO2, Serum 28  Calcium (Total), Serum 9.1  Anion Gap 7  Osmolality (calc) 285  eGFR (African American) >60  eGFR (Non-African American) >60 (eGFR values <36m/min/1.73 m2 may be an indication of chronic kidney disease (CKD). Calculated eGFR, using the MRDR Study equation, is useful in  patients with stable renal function. The eGFR calculation will not be reliable in acutely ill patients when serum creatinine is changing rapidly. It is not useful in patients on dialysis. The eGFR calculation may not be applicable to patients at the low and high extremes of body sizes, pregnant women, and vegetarians.)  Cardiac:  31-Jan-16 03:15   Troponin I  0.07 (0.00-0.05 0.05 ng/mL or less: NEGATIVE  Repeat testing in 3-6 hrs  if clinically indicated. >0.05 ng/mL: POTENTIAL  MYOCARDIAL INJURY. Repeat  testing in 3-6 hrs if  clinically indicated. NOTE: An increase or decrease  of 30% or more on serial  testing suggests a  clinically important change)  Routine Hem:  31-Jan-16 03:15   WBC (CBC) 8.1  RBC (CBC) 4.71  Hemoglobin (CBC) 14.2  Hematocrit (CBC) 42.1  Platelet Count (CBC)  138  MCV 89  MCH 30.1  MCHC 33.8  RDW 13.0  Neutrophil % 49.2  Lymphocyte % 40.1  Monocyte % 8.5  Eosinophil % 1.6  Basophil % 0.6  Neutrophil # 4.0  Lymphocyte # 3.3  Monocyte # 0.7  Eosinophil # 0.1  Basophil # 0.0 (Result(s) reported on 16 Dec 2014 at 03:44AM.)   MRI:    31-Jan-16 11:11, MRI Brain Without Contrast  MRI Brain Without Contrast   REASON FOR EXAM:    CVA  COMMENTS:       PROCEDURE: MR  - MR BRAIN WO CONTRAST  - Dec 16 2014 11:11AM     CLINICAL DATA:  Headache. Dizziness. Balance disturbance. Symptoms  began yesterday.    EXAM:  MRI HEAD WITHOUT CONTRAST    TECHNIQUE:  Multiplanar, multiecho pulse sequences of the brain and surrounding  structures were obtained without intravenous contrast.  COMPARISON:  CT 12/15/2014.  MRI  04/02/2007.    FINDINGS:  Diffusion imaging shows 5 punctate foci of restricted diffusion  consistentwith micro embolic infarctions. There is a tiny focus in  the right lateral medulla. There is a 3 mm focus in the left  temporal lobe. There is a 2 mm focus in the right lateral thalamus.  There is a 2 mm focus in the inferior left frontal lobe/inferior  basal ganglia. There is a 2 mm focus in the superior medial right  thalamus. These suggest micro embolic events from the heart or  ascending aorta.    There chronic ischemic changes within the pons. There are old small  vessel cerebellar infarctions. There are mild chronic small-vessel  changes affecting the cerebral hemispheric white matter. No cortical  or large vessel  territory infarction. No mass lesion, hemorrhage,  hydrocephalus or extra-axial collection. No pituitary mass. No  inflammatory sinus disease. No skull or skullbase lesion.     IMPRESSION:  Five punctate foci of restricted diffusion consistent with micro  embolic infarctions affecting the right lateral medulla, left  temporal lobe, 2 foci in the right thalamus, and left inferior  frontal lobe/basal ganglia. This distribution would suggest cardiac  or ascending aortic source.    Chronic small vessel ischemic changes affecting the pons in the  cerebral hemispheric white matter.    Electronically Signed    By: Nelson Chimes M.D.    On: 12/16/2014 11:44         Verified By: Jules Schick, M.D.,    Impression 1.  Critical stenosis of the left ICA, It should be noted that the right ICA is not critical Presently she is in the process of cardiac clearance for left CEA . Initially I discussed with teh family that we should move forward with the CEA this wednesday.  However, now I have had the chance to review the MRI which shows multiple infarcts and given this finding I would delay surgery for about 3 weeks. 2.  CVA  MRI reviewed and it shows multile infarcts in  multiple locations most consistent with embolic origin.  I will defer surgery for now given this new finding and agree with moving forward with evaluation of a cardiac source. 3.  Elevated troponin: Place on telemetry. Trend cardiac enzymes x 3. Initiate aspirin and statin therapy. Would avoid anticoagulation given potential for stroke. 4.  Hypertension, essential: Permissive hypertension now. Hold Norvasc and hydrochlorothiazide the first 24 hours after transient ischemic attack symptoms.  5.  Hyperlipidemia, unspecified: Continue with statin therapy.   Plan level 3 cnsult   Electronic Signatures: Hortencia Pilar (MD)  (Signed 31-Jan-16 20:02)  Authored: General Aspect/Present Illness, Home Medications, Allergies, History and Physical Exam, Vital Signs, Labs, Radiology, Impression/Plan   Last Updated: 31-Jan-16 20:02 by Hortencia Pilar (MD)

## 2015-03-17 NOTE — Discharge Summary (Signed)
PATIENT NAME:  [name], [name] MR#:  [id number] DATE OF BIRTH:  January 19, 1929  DATE OF ADMISSION:  12/16/2014 DATE OF DISCHARGE:  12/19/2014  DISCHARGE DIAGNOSES:  1.  New cerebrovascular accident.  2.  Elevated troponin, likely due to supply/demand ischemia from cerebrovascular accident.  3.  Hyperlipidemia.  4.  Carotid artery stenosis.   SECONDARY DIAGNOSES:   Peripheral vascular disease, known high-grade bilateral internal carotid artery stenosis about 80% bilaterally; gastroesophageal reflux disease without esophagitis; history of TIA; hyperlipidemia, unspecified; hypertension, essential.   CONSULTATIONS:  1.  Neurology, Leotis Pain, MD. 2.  Cardiology, Javier Docker. Ubaldo Glassing, MD. 3.  Vascular surgery, Katha Cabal, MD.  PROCEDURES AND RADIOLOGY: Chest x-ray on January 30 showed no acute cardiopulmonary disease.   CT scan of the head without contrast showed chronic microvascular ischemic disease. No acute findings.   CT angiogram of the head and neck with and without contrast on January 31 showed no proximal branch occlusion with intracranial circulation, irregular atheromatous irregularity with associated stenosis of up to 50% within the proximal basilar artery. Distal atheromatous branch irregularity within the MCA and PCA branches bilaterally. Multifocal atheromatous plaque throughout the cavernous carotid arteries bilaterally with associated stenosis of up to 70%.   MRI of the brain without contrast on January 31 showed 5 punctate foci of restricted diffusion consistent with microembolic infarction effecting the right lateral medulla, left temporal lobe, 2 foci in the right thalamus and left inferior frontal lobe/basal ganglia suggestive of possible cardiac or ascending aortic source. Chronic small vessel ischemic changes effecting pons in the cerebral hemispheric white matter.   Myoview on February 1, no significant wall motion abnormality. No significant ischemia. EF of 75%.    A 2-D echocardiogram on January 31 showed EF of 70% to 75%. Impaired relaxation of LV diastolic filling. Mild to moderate aortic valve sclerosis/calcification. Mild tricuspid regurgitation.   TEE on February 2 showed no cardiac source of emboli. No right to left shunt.   LABORATORY PANEL: UA on admission was negative.   HISTORY AND SHORT HOSPITAL COURSE: The patient is an 81 year old female with the above-mentioned medical problems who was admitted for weakness with wobbly gait worrisome for TIA/CVA. Please see Dr. Ardith Dark dictated history and physical for further details. Neurology consultation was obtained with Dr. Leotis Pain who recommended full neurological workup. MRI was worrisome for embolic stroke. Carotid Dopplers also showed 80% stenosis of the left ICA for which vascular surgery consultation was obtained with Dr. Hortencia Pilar who recommended CT angiogram which also confirmed left ICA stenosis, but considering embolic phenomenon stroke, carotid endarterectomy was postponed. Cardiology consultation was obtained with Dr. Bartholome Bill for preoperative cardiac clearance and possible carotid endarterectomy, but the procedure subsequently was canceled due to this embolic phenomena of stroke. TEE and 2-D echocardiogram were performed by Dr. Bartholome Bill, which did not show any cardiac source of emboli. The patient was slowly improving. Physical therapy consultation was obtained who recommended short-term rehabilitation where she is being discharged in stable condition.   PHYSICAL EXAMINATION:  VITAL SIGNS: On the date of discharge, her vital signs are as follows: Temperature 97.8, heart rate 61 per minute, respirations 18 per minute, blood pressure 127/77. She is saturating 96% on room air.   PERTINENT PHYSICAL EXAMINATION THE DATE OF DISCHARGE:  CARDIOVASCULAR: S1, S2 normal. No murmurs, rubs or gallop.  LUNGS: Clear to auscultation bilaterally. No wheezing, rales, rhonchi,  crepitation.  ABDOMEN: Soft, benign.  NEUROLOGIC: Nonfocal examination.   All other physical examination  remained at baseline.   DISCHARGE MEDICATIONS:   Medication Instructions  hydrochlorothiazide 12.5 mg oral tablet  1 tab(s) orally once a day   temazepam 15 mg oral capsule  1 cap(s) orally once a day (at bedtime)   vitamin d3 1000 intl units oral capsule  1 cap(s) orally once a day   fish oil oral capsule  1 cap(s) orally 3 times a day   oxybutynin 5 mg oral tablet  1 tab(s) orally 2 times a day   norvasc 5 mg oral tablet  1 tab(s) orally once a day   miralax - oral powder for reconstitution  1 cap(s) orally 1 to 4 times a day, As Needed   ecotrin 325 mg oral delayed release tablet  1 tab(s) orally once a day   atorvastatin 40 mg oral tablet  1 tab(s) orally once a day      DISCHARGE DIET: Low sodium, low fat, low cholesterol.   DISCHARGE ACTIVITY: As tolerated.   DISCHARGE INSTRUCTIONS AND FOLLOWUP: The patient was instructed to follow up with her primary care physician, Dr. Margarita Rana, in 1-2 weeks. She will need followup with Dr. Hortencia Pilar from vascular surgery in 2-4 weeks. She will need followup with [facility] in 4-6 weeks. She will get physical therapy evaluation and management while at the facility.   TOTAL TIME DISCHARGING THIS PATIENT: Fifty-five minutes.   ____________________________ Lucina Mellow. Manuella Ghazi, MD vss:TT D: 12/19/2014 14:28:30 ET T: 12/19/2014 14:55:50 ET JOB#: [id number]  cc: Joan Avetisyan S. Manuella Ghazi, MD, <Dictator> [name] Belfast, MD Katha Cabal, MD Leotis Pain, MD Javier Docker. Ubaldo Glassing, MD Remer Macho MD ELECTRONICALLY SIGNED 12/19/2014 15:35

## 2015-03-17 NOTE — Op Note (Signed)
PATIENT NAME:  [name], [name] MR#:  [id number] DATE OF BIRTH:  November 23, 1928  DATE OF PROCEDURE:  01/18/2015  PREOPERATIVE DIAGNOSIS:  Critical stenosis of the left internal carotid artery.   POSTOPERATIVE DIAGNOSIS:  Critical stenosis of the left internal carotid artery.   PROCEDURE PERFORMED:  Left carotid endarterectomy with CorMatrix patch angioplasties.   SURGEON: Hortencia Pilar, MD.    ANESTHESIA: General by endotracheal intubation.   FLUIDS: Per anesthesia record.   ESTIMATED BLOOD LOSS: 100 mL.   SPECIMEN: Plaque to pathology for permanent section.   INDICATIONS: [name] is an 81 year old woman who presented with critical stenosis of her left internal carotid artery. The risks, benefits, as well as alternative therapies were reviewed. All questions have been answered. The patient has agreed to proceed.   DESCRIPTION OF PROCEDURE: The patient is taken to the operating room and placed in the supine position. After adequate general anesthesia is induced and appropriate invasive monitors are placed she is positioned supine with her neck extended and rotated to the right. The left neck and chest wall are prepped and draped in a sterile fashion.   After appropriate timeout is called a curvilinear incision is created along the anterior margin of the sternocleidomastoid muscle and carried down through the soft tissues. External jugular vein is ligated with 3-0 silk ties. The platysma is transected with Bovie cautery. Sternocleidomastoid muscle was then reflected posterolaterally and the omohyoid muscle is identified. Common carotid artery as well as the vagus nerve which is located in an anterior medial position is identified. The vagus nerve is then meticulously dissected along its lateral margin so that it could be reflected median away from the carotid. The common carotid bulb, external, and internal carotid arteries are all dissected. Vessel loops are passed around the external carotid  artery as well as superior thyroid.  7000 units of heparin are given.   The common followed by the external, followed by the internal is clamped. Arteriotomy is made, extended with Potts scissors, and an indwelling Sundt shunt is placed without difficulty. Flow is then re-established to the brain.   The endarterectomy is then performed under direct visualization for the distal common bulb and internal carotid artery. External carotid artery is treated with eversion technique. Interrupted 7-0 Prolene sutures are used to tack the distal intimal edge within the internal carotid artery. CorMatrix patch is rehydrated on the back table, beveled, and then applied to repair the arterial defect using 6-0 Prolene in a running fashion with a 4 quadrant technique. Flushing maneuvers are performed throughout the application of the patch and the distal suture line is completed after backbleeding from the internal and further irrigation. Flow is then re-established first to the external carotid artery and then the internal carotid artery to prevent distal embolization. Wound is then inspected for hemostasis. Evicel is placed in the posterior margin of the artery. Surgicel is placed anteriorly along the patch and the platysma is reapproximated with running 3-0 Vicryl. The skin is closed with 4-0 Monocryl subcuticular and Dermabond is applied. The patient tolerated the procedure well. She was awakened in the operating room, moving all extremities, and obeying simple commands. She is taken to the recovery room in stable condition.    ____________________________ Katha Cabal, MD ggs:bu D: 01/18/2015 13:30:55 ET T: 01/18/2015 15:12:14 ET JOB#: [id number]  cc: Katha Cabal, MD, <Dictator> [name] Belfast, MD Katha Cabal MD ELECTRONICALLY SIGNED 01/22/2015 14:28

## 2015-04-08 DIAGNOSIS — E118 Type 2 diabetes mellitus with unspecified complications: Secondary | ICD-10-CM | POA: Diagnosis not present

## 2015-04-08 DIAGNOSIS — Z0000 Encounter for general adult medical examination without abnormal findings: Secondary | ICD-10-CM | POA: Diagnosis not present

## 2015-04-08 DIAGNOSIS — I10 Essential (primary) hypertension: Secondary | ICD-10-CM | POA: Diagnosis not present

## 2015-04-08 DIAGNOSIS — I639 Cerebral infarction, unspecified: Secondary | ICD-10-CM | POA: Diagnosis not present

## 2015-04-16 DIAGNOSIS — E119 Type 2 diabetes mellitus without complications: Secondary | ICD-10-CM | POA: Diagnosis not present

## 2015-04-16 DIAGNOSIS — I10 Essential (primary) hypertension: Secondary | ICD-10-CM | POA: Diagnosis not present

## 2015-04-16 DIAGNOSIS — E785 Hyperlipidemia, unspecified: Secondary | ICD-10-CM | POA: Diagnosis not present

## 2015-04-16 LAB — LIPID PANEL
CHOLESTEROL: 146 mg/dL (ref 0–200)
HDL: 42 mg/dL (ref 35–70)
LDL Cholesterol: 70 mg/dL
TRIGLYCERIDES: 171 mg/dL — AB (ref 40–160)

## 2015-04-16 LAB — BASIC METABOLIC PANEL
BUN: 12 mg/dL (ref 4–21)
CREATININE: 0.7 mg/dL (ref 0.5–1.1)
Glucose: 133 mg/dL
POTASSIUM: 4.2 mmol/L (ref 3.4–5.3)
Sodium: 142 mmol/L (ref 137–147)

## 2015-04-16 LAB — CBC AND DIFFERENTIAL
HCT: 42 % (ref 36–46)
HEMOGLOBIN: 14.6 g/dL (ref 12.0–16.0)
PLATELETS: 146 10*3/uL — AB (ref 150–399)
WBC: 7.1 10*3/mL

## 2015-04-16 LAB — HEPATIC FUNCTION PANEL
ALT: 26 U/L (ref 7–35)
AST: 23 U/L (ref 13–35)

## 2015-04-16 LAB — HEMOGLOBIN A1C: Hgb A1c MFr Bld: 6.6 % — AB (ref 4.0–6.0)

## 2015-04-16 LAB — TSH: TSH: 2.05 u[IU]/mL (ref 0.41–5.90)

## 2015-05-02 DIAGNOSIS — E785 Hyperlipidemia, unspecified: Secondary | ICD-10-CM | POA: Diagnosis not present

## 2015-05-02 DIAGNOSIS — I10 Essential (primary) hypertension: Secondary | ICD-10-CM | POA: Diagnosis not present

## 2015-05-02 DIAGNOSIS — I6529 Occlusion and stenosis of unspecified carotid artery: Secondary | ICD-10-CM | POA: Diagnosis not present

## 2015-05-02 DIAGNOSIS — M1990 Unspecified osteoarthritis, unspecified site: Secondary | ICD-10-CM | POA: Diagnosis not present

## 2015-05-29 DIAGNOSIS — M7551 Bursitis of right shoulder: Secondary | ICD-10-CM | POA: Diagnosis not present

## 2015-06-04 ENCOUNTER — Other Ambulatory Visit: Payer: Self-pay | Admitting: Family Medicine

## 2015-06-04 DIAGNOSIS — Z1231 Encounter for screening mammogram for malignant neoplasm of breast: Secondary | ICD-10-CM

## 2015-07-08 DIAGNOSIS — H01009 Unspecified blepharitis unspecified eye, unspecified eyelid: Secondary | ICD-10-CM | POA: Diagnosis not present

## 2015-07-25 DIAGNOSIS — H04123 Dry eye syndrome of bilateral lacrimal glands: Secondary | ICD-10-CM | POA: Diagnosis not present

## 2015-08-05 ENCOUNTER — Ambulatory Visit (INDEPENDENT_AMBULATORY_CARE_PROVIDER_SITE_OTHER): Payer: Medicare Other | Admitting: Family Medicine

## 2015-08-05 ENCOUNTER — Encounter: Payer: Self-pay | Admitting: Family Medicine

## 2015-08-05 VITALS — BP 124/68 | HR 104 | Temp 99.1°F | Resp 20 | Ht 61.0 in | Wt 142.0 lb

## 2015-08-05 DIAGNOSIS — G4700 Insomnia, unspecified: Secondary | ICD-10-CM | POA: Insufficient documentation

## 2015-08-05 DIAGNOSIS — K635 Polyp of colon: Secondary | ICD-10-CM | POA: Insufficient documentation

## 2015-08-05 DIAGNOSIS — I639 Cerebral infarction, unspecified: Secondary | ICD-10-CM

## 2015-08-05 DIAGNOSIS — R748 Abnormal levels of other serum enzymes: Secondary | ICD-10-CM | POA: Insufficient documentation

## 2015-08-05 DIAGNOSIS — M545 Low back pain, unspecified: Secondary | ICD-10-CM | POA: Insufficient documentation

## 2015-08-05 DIAGNOSIS — K219 Gastro-esophageal reflux disease without esophagitis: Secondary | ICD-10-CM | POA: Insufficient documentation

## 2015-08-05 DIAGNOSIS — I809 Phlebitis and thrombophlebitis of unspecified site: Secondary | ICD-10-CM | POA: Insufficient documentation

## 2015-08-05 DIAGNOSIS — I6529 Occlusion and stenosis of unspecified carotid artery: Secondary | ICD-10-CM | POA: Insufficient documentation

## 2015-08-05 DIAGNOSIS — R000 Tachycardia, unspecified: Secondary | ICD-10-CM

## 2015-08-05 DIAGNOSIS — K5790 Diverticulosis of intestine, part unspecified, without perforation or abscess without bleeding: Secondary | ICD-10-CM

## 2015-08-05 DIAGNOSIS — E119 Type 2 diabetes mellitus without complications: Secondary | ICD-10-CM | POA: Diagnosis not present

## 2015-08-05 DIAGNOSIS — R5383 Other fatigue: Secondary | ICD-10-CM | POA: Insufficient documentation

## 2015-08-05 DIAGNOSIS — M810 Age-related osteoporosis without current pathological fracture: Secondary | ICD-10-CM | POA: Insufficient documentation

## 2015-08-05 DIAGNOSIS — R519 Headache, unspecified: Secondary | ICD-10-CM | POA: Insufficient documentation

## 2015-08-05 DIAGNOSIS — R350 Frequency of micturition: Secondary | ICD-10-CM | POA: Insufficient documentation

## 2015-08-05 DIAGNOSIS — J309 Allergic rhinitis, unspecified: Secondary | ICD-10-CM | POA: Insufficient documentation

## 2015-08-05 DIAGNOSIS — I10 Essential (primary) hypertension: Secondary | ICD-10-CM | POA: Insufficient documentation

## 2015-08-05 DIAGNOSIS — R87629 Unspecified abnormal cytological findings in specimens from vagina: Secondary | ICD-10-CM | POA: Insufficient documentation

## 2015-08-05 DIAGNOSIS — R51 Headache: Secondary | ICD-10-CM

## 2015-08-05 DIAGNOSIS — Z8673 Personal history of transient ischemic attack (TIA), and cerebral infarction without residual deficits: Secondary | ICD-10-CM | POA: Insufficient documentation

## 2015-08-05 DIAGNOSIS — E669 Obesity, unspecified: Secondary | ICD-10-CM | POA: Insufficient documentation

## 2015-08-05 DIAGNOSIS — R531 Weakness: Secondary | ICD-10-CM | POA: Insufficient documentation

## 2015-08-05 DIAGNOSIS — N6009 Solitary cyst of unspecified breast: Secondary | ICD-10-CM | POA: Insufficient documentation

## 2015-08-05 DIAGNOSIS — E785 Hyperlipidemia, unspecified: Secondary | ICD-10-CM | POA: Insufficient documentation

## 2015-08-05 DIAGNOSIS — N950 Postmenopausal bleeding: Secondary | ICD-10-CM | POA: Insufficient documentation

## 2015-08-05 DIAGNOSIS — I350 Nonrheumatic aortic (valve) stenosis: Secondary | ICD-10-CM | POA: Insufficient documentation

## 2015-08-05 HISTORY — DX: Polyp of colon: K63.5

## 2015-08-05 HISTORY — DX: Diverticulosis of intestine, part unspecified, without perforation or abscess without bleeding: K57.90

## 2015-08-05 HISTORY — DX: Gastro-esophageal reflux disease without esophagitis: K21.9

## 2015-08-05 HISTORY — DX: Low back pain, unspecified: M54.50

## 2015-08-05 NOTE — Progress Notes (Signed)
Subjective:    Patient ID: [name], female    DOB: [birth year], 81 y.o.   MRN: [id number]  HPI  Fatigue: Patient complains of fatigue. Symptoms began 3 days ago. Sentinal symptom the patient feels fatigue began with: none. Symptoms of her fatigue have been change in appetite, general malaise and headaches. Patient describes the following psychologic symptoms: none.  Patient denies fever, significant change in weight, symptoms of arthritis, unusual rashes, cold intolerance, constipation and change in hair texture., GI blood loss and witnessed or suspected sleep apnea. Symptoms have gradually improved. Severity has been mild. Previous visits for this problem: none.  Pt had sudden onset of malaise/fatigue on Friday am, and compared this to the CVA she experienced in 2008. No stroke symptoms. Pt recently had surgery (12/2014) secondary to CVA on 12/16/2014. Pt reports the surgeon advised her that she might experience fatigue after the surgery, but had been feeling well before.  Better today, but still not right.  Has been resting.  Just still with not much energy or appetite.      Review of Systems  Constitutional: Positive for appetite change and fatigue. Negative for fever, chills, diaphoresis, activity change and unexpected weight change.  Respiratory: Negative for cough, shortness of breath and wheezing.   Cardiovascular: Negative for chest pain, palpitations and leg swelling.  Musculoskeletal: Positive for myalgias (leg cramps).  Neurological: Positive for weakness and headaches. Negative for dizziness, syncope, speech difficulty, light-headedness and numbness.   BP 124/68 mmHg  Pulse 104  Temp(Src) 99.1 F (37.3 C) (Oral)  Resp 20  Ht 5\' 1"  (1.549 m)  Wt 142 lb (64.411 kg)  BMI 26.84 kg/m2   Patient Active Problem List   Diagnosis Date Noted  . Allergic rhinitis 08/05/2015  . Aortic heart valve narrowing 08/05/2015  . Breast cyst 08/05/2015  . Carotid artery narrowing  08/05/2015  . Colon polyp 08/05/2015  . Cerebral vascular accident 08/05/2015  . Diabetes mellitus, type 2 08/05/2015  . DD (diverticular disease) 08/05/2015  . Elevated CK 08/05/2015  . Acid reflux 08/05/2015  . Headache disorder 08/05/2015  . Calcium blood increased 08/05/2015  . HLD (hyperlipidemia) 08/05/2015  . BP (high blood pressure) 08/05/2015  . Cannot sleep 08/05/2015  . LBP (low back pain) 08/05/2015  . OP (osteoporosis) 08/05/2015  . Adiposity 08/05/2015  . Abnormal Pap smear of vagina 08/05/2015  . Hemorrhage, postmenopausal 08/05/2015  . Superficial thrombophlebitis 08/05/2015  . FOM (frequency of micturition) 08/05/2015   No past medical history on file. No current outpatient prescriptions on file prior to visit.   No current facility-administered medications on file prior to visit.   Allergies  Allergen Reactions  . Cephalosporins   . Codeine   . Macrolides And Ketolides   . Penicillins   . Tramadol Nausea Only  . Sulfa Antibiotics Rash   Past Surgical History  Procedure Laterality Date  . Carotid artery angioplasty Left 01/18/2015    Dr. Delana Meyer [facility]  . Cholecystectomy  06/27/2012  . Breast surgery      1980's  . Decompressive lumbar laminectomy level 1  1950  . Eye surgery Right 2012    cataract extraction   Social History   Social History  . Marital Status: Widowed    Spouse Name: N/A  . Number of Children: N/A  . Years of Education: N/A   Occupational History  . Not on file.   Social History Main Topics  . Smoking status: Former Smoker -- 0.50 packs/day  Quit date: 11/15/1976  . Smokeless tobacco: Never Used  . Alcohol Use: No  . Drug Use: No  . Sexual Activity: Not on file   Other Topics Concern  . Not on file   Social History Narrative   Family History  Problem Relation Age of Onset  . Ovarian cancer Mother   . Heart disease Brother   . Heart attack Brother   . Gout Brother   . Alzheimer's disease Brother   . Dementia  Brother   . Heart attack Brother        Objective:   Physical Exam  Constitutional: She is oriented to person, place, and time. She appears well-developed and well-nourished.  Cardiovascular: Normal rate and regular rhythm.   Murmur heard. Pulmonary/Chest: Effort normal and breath sounds normal.  Neurological: She is alert and oriented to person, place, and time.   BP 124/68 mmHg  Pulse 104  Temp(Src) 99.1 F (37.3 C) (Oral)  Resp 20  Ht 5\' 1"  (1.549 m)  Wt 142 lb (64.411 kg)  BMI 26.84 kg/m2     Assessment & Plan:  1. Other fatigue Some better, but not at baseline. Will check labs.  Further plan pending these results. No focal symptoms.   - CBC with Differential/Platelet - Comprehensive metabolic panel - TSH - EKG 12-Lead  2. Type 2 diabetes mellitus without complication Stable. Check labs.  - Hemoglobin A1c  3. Tachycardia EKG with some changes from previous on that I have on record. Sent copy to her cardiologist, Dr. Ubaldo Glassing, who reported that he did not need to see her before her scheduled follow up.  Patient with no specific cardiac symptoms.   - EKG 12-Lead  Margarita Rana, MD

## 2015-08-06 ENCOUNTER — Telehealth: Payer: Self-pay

## 2015-08-06 LAB — COMPREHENSIVE METABOLIC PANEL
ALK PHOS: 98 IU/L (ref 39–117)
ALT: 19 IU/L (ref 0–32)
AST: 17 IU/L (ref 0–40)
Albumin/Globulin Ratio: 1.4 (ref 1.1–2.5)
Albumin: 4.3 g/dL (ref 3.5–4.7)
BILIRUBIN TOTAL: 0.5 mg/dL (ref 0.0–1.2)
BUN / CREAT RATIO: 14 (ref 11–26)
BUN: 13 mg/dL (ref 8–27)
CHLORIDE: 97 mmol/L (ref 97–108)
CO2: 28 mmol/L (ref 18–29)
Calcium: 10.2 mg/dL (ref 8.7–10.3)
Creatinine, Ser: 0.95 mg/dL (ref 0.57–1.00)
GFR calc Af Amer: 64 mL/min/{1.73_m2} (ref >59)
GFR calc non Af Amer: 55 mL/min/{1.73_m2} — ABNORMAL LOW (ref >59)
GLUCOSE: 282 mg/dL — AB (ref 65–99)
Globulin, Total: 3.1 g/dL (ref 1.5–4.5)
Potassium: 4.6 mmol/L (ref 3.5–5.2)
Sodium: 142 mmol/L (ref 134–144)
Total Protein: 7.4 g/dL (ref 6.0–8.5)

## 2015-08-06 LAB — CBC WITH DIFFERENTIAL/PLATELET
BASOS ABS: 0 10*3/uL (ref 0.0–0.2)
Basos: 0 %
EOS (ABSOLUTE): 0.1 10*3/uL (ref 0.0–0.4)
Eos: 1 %
Hematocrit: 40.9 % (ref 34.0–46.6)
Hemoglobin: 14.2 g/dL (ref 11.1–15.9)
Immature Grans (Abs): 0 10*3/uL (ref 0.0–0.1)
Immature Granulocytes: 0 %
LYMPHS ABS: 2.8 10*3/uL (ref 0.7–3.1)
LYMPHS: 30 %
MCH: 30 pg (ref 26.6–33.0)
MCHC: 34.7 g/dL (ref 31.5–35.7)
MCV: 86 fL (ref 79–97)
Monocytes Absolute: 0.6 10*3/uL (ref 0.1–0.9)
Monocytes: 7 %
NEUTROS ABS: 5.8 10*3/uL (ref 1.4–7.0)
Neutrophils: 62 %
PLATELETS: 230 10*3/uL (ref 150–379)
RBC: 4.74 x10E6/uL (ref 3.77–5.28)
RDW: 13.8 % (ref 12.3–15.4)
WBC: 9.3 10*3/uL (ref 3.4–10.8)

## 2015-08-06 LAB — HEMOGLOBIN A1C
ESTIMATED AVERAGE GLUCOSE: 151 mg/dL
HEMOGLOBIN A1C: 6.9 % — AB (ref 4.8–5.6)

## 2015-08-06 LAB — TSH: TSH: 1 u[IU]/mL (ref 0.450–4.500)

## 2015-08-06 NOTE — Telephone Encounter (Signed)
Tried calling; no answer 08/06/2015  Thanks,   -Mickel Baas

## 2015-08-06 NOTE — Telephone Encounter (Signed)
-----   Message from Margarita Rana, MD sent at 08/06/2015  7:37 AM EDT ----- Labs stable. Please see how patient is feeling. Thanks.

## 2015-08-08 NOTE — Telephone Encounter (Signed)
Pt advised; she reports feeling a little better today.   Thanks,   -Mickel Baas

## 2015-08-16 DIAGNOSIS — H02103 Unspecified ectropion of right eye, unspecified eyelid: Secondary | ICD-10-CM | POA: Diagnosis not present

## 2015-08-29 DIAGNOSIS — L821 Other seborrheic keratosis: Secondary | ICD-10-CM | POA: Diagnosis not present

## 2015-08-29 DIAGNOSIS — X32XXXA Exposure to sunlight, initial encounter: Secondary | ICD-10-CM | POA: Diagnosis not present

## 2015-08-29 DIAGNOSIS — D0439 Carcinoma in situ of skin of other parts of face: Secondary | ICD-10-CM | POA: Diagnosis not present

## 2015-08-29 DIAGNOSIS — L570 Actinic keratosis: Secondary | ICD-10-CM | POA: Diagnosis not present

## 2015-08-29 DIAGNOSIS — D485 Neoplasm of uncertain behavior of skin: Secondary | ICD-10-CM | POA: Diagnosis not present

## 2015-09-05 DIAGNOSIS — Z23 Encounter for immunization: Secondary | ICD-10-CM | POA: Diagnosis not present

## 2015-09-09 DIAGNOSIS — I350 Nonrheumatic aortic (valve) stenosis: Secondary | ICD-10-CM | POA: Diagnosis not present

## 2015-09-09 DIAGNOSIS — E782 Mixed hyperlipidemia: Secondary | ICD-10-CM | POA: Diagnosis not present

## 2015-09-09 DIAGNOSIS — I10 Essential (primary) hypertension: Secondary | ICD-10-CM | POA: Diagnosis not present

## 2015-09-09 DIAGNOSIS — I6529 Occlusion and stenosis of unspecified carotid artery: Secondary | ICD-10-CM | POA: Diagnosis not present

## 2015-09-10 ENCOUNTER — Other Ambulatory Visit: Payer: Self-pay | Admitting: Family Medicine

## 2015-09-10 ENCOUNTER — Ambulatory Visit
Admission: RE | Admit: 2015-09-10 | Discharge: 2015-09-10 | Disposition: A | Discharge status: Home / Self Care | Payer: Medicare Other | Source: Ambulatory Visit | Attending: Family Medicine | Admitting: Family Medicine

## 2015-09-10 DIAGNOSIS — Z1231 Encounter for screening mammogram for malignant neoplasm of breast: Secondary | ICD-10-CM | POA: Insufficient documentation

## 2015-09-10 HISTORY — DX: Malignant (primary) neoplasm, unspecified: C80.1

## 2015-09-23 ENCOUNTER — Other Ambulatory Visit: Payer: Self-pay | Admitting: Family Medicine

## 2015-09-23 MED ORDER — TEMAZEPAM 15 MG PO CAPS: 15.0000 mg | ORAL_CAPSULE | Freq: Every day | ORAL | Status: DC

## 2015-09-23 NOTE — Telephone Encounter (Signed)
Patient needs a refill on temazepam (RESTORIL) 15 MG capsule.  She uses [facility] on [address].

## 2015-09-23 NOTE — Telephone Encounter (Signed)
Last prescribed as temazepam 15mg  take 1-2 tablets at bedtime #180 and 1 refill on 02/05/15. Last OV was 08/05/15.

## 2015-09-23 NOTE — Telephone Encounter (Signed)
Printed, please fax or call in to pharmacy. Thank you.   

## 2015-10-17 DIAGNOSIS — H00019 Hordeolum externum unspecified eye, unspecified eyelid: Secondary | ICD-10-CM | POA: Diagnosis not present

## 2015-11-04 DIAGNOSIS — I639 Cerebral infarction, unspecified: Secondary | ICD-10-CM | POA: Diagnosis not present

## 2015-11-04 DIAGNOSIS — I10 Essential (primary) hypertension: Secondary | ICD-10-CM | POA: Diagnosis not present

## 2015-11-04 DIAGNOSIS — M1990 Unspecified osteoarthritis, unspecified site: Secondary | ICD-10-CM | POA: Diagnosis not present

## 2015-11-04 DIAGNOSIS — I6523 Occlusion and stenosis of bilateral carotid arteries: Secondary | ICD-10-CM | POA: Diagnosis not present

## 2015-11-04 DIAGNOSIS — E785 Hyperlipidemia, unspecified: Secondary | ICD-10-CM | POA: Diagnosis not present

## 2015-11-22 ENCOUNTER — Other Ambulatory Visit: Payer: Self-pay | Admitting: Family Medicine

## 2015-11-22 DIAGNOSIS — G4700 Insomnia, unspecified: Secondary | ICD-10-CM

## 2015-11-22 MED ORDER — TEMAZEPAM 15 MG PO CAPS: 15.0000 mg | ORAL_CAPSULE | Freq: Every day | ORAL | Status: DC

## 2015-11-22 NOTE — Telephone Encounter (Signed)
Printed, please fax or call in to pharmacy. Thank you.   

## 2015-11-22 NOTE — Telephone Encounter (Signed)
Patient would like a refill of temazepam (RESTORIL) 15 MG capsule called into [facility].  She only has two left and would like it called in this morning if possible because of the snow and she is already in town.

## 2015-12-16 DIAGNOSIS — Z85828 Personal history of other malignant neoplasm of skin: Secondary | ICD-10-CM | POA: Diagnosis not present

## 2015-12-16 DIAGNOSIS — D0439 Carcinoma in situ of skin of other parts of face: Secondary | ICD-10-CM | POA: Diagnosis not present

## 2015-12-16 DIAGNOSIS — X32XXXA Exposure to sunlight, initial encounter: Secondary | ICD-10-CM | POA: Diagnosis not present

## 2015-12-16 DIAGNOSIS — L570 Actinic keratosis: Secondary | ICD-10-CM | POA: Diagnosis not present

## 2015-12-16 DIAGNOSIS — C4441 Basal cell carcinoma of skin of scalp and neck: Secondary | ICD-10-CM | POA: Diagnosis not present

## 2015-12-16 DIAGNOSIS — D485 Neoplasm of uncertain behavior of skin: Secondary | ICD-10-CM | POA: Diagnosis not present

## 2016-01-20 ENCOUNTER — Other Ambulatory Visit: Payer: Self-pay | Admitting: Family Medicine

## 2016-01-20 DIAGNOSIS — G4700 Insomnia, unspecified: Secondary | ICD-10-CM

## 2016-01-20 MED ORDER — TEMAZEPAM 15 MG PO CAPS: 15.0000 mg | ORAL_CAPSULE | Freq: Every day | ORAL | Status: DC

## 2016-01-20 NOTE — Telephone Encounter (Signed)
Called in to pharmacy. Sefora Tietje Drozdowski, CMA  

## 2016-01-20 NOTE — Telephone Encounter (Signed)
Patient needs a refill for temazepam (RESTORIL) 15 MG capsule.  Patient uses [facility].

## 2016-01-20 NOTE — Telephone Encounter (Signed)
Printed, please fax or call in to pharmacy. Thank you.   

## 2016-01-27 ENCOUNTER — Other Ambulatory Visit: Payer: Self-pay | Admitting: Family Medicine

## 2016-02-02 IMAGING — MR MRI HEAD WITHOUT CONTRAST
10 series · 48 of 48 positions shown · non-contrast
Comparison: CT 12/15/2014.  MRI 04/02/2007.

CLINICAL DATA: Headache. Dizziness. Balance disturbance. Symptoms
began yesterday.

EXAM:
MRI HEAD WITHOUT CONTRAST
TECHNIQUE: Multiplanar, multiecho pulse sequences of the brain and surrounding
structures were obtained without intravenous contrast.

[Series 2: T1 · sagittal · 5.0mm · 0.45mm/px · 4 of 28 slices shown (1 of 2)]
[im 1/28]
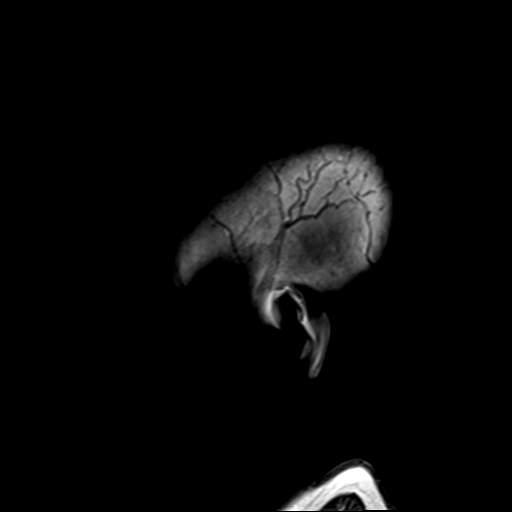
[im 10/28]
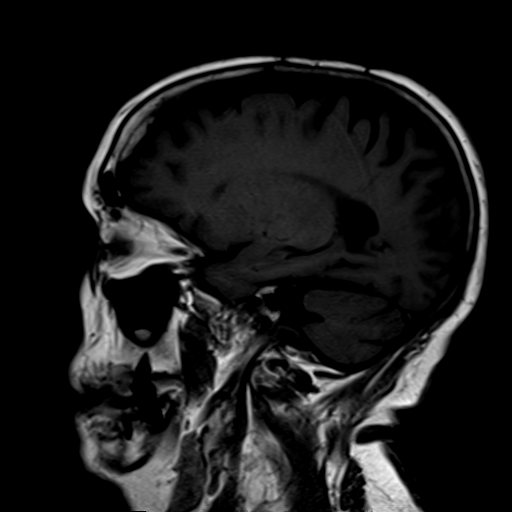
[im 19/28]
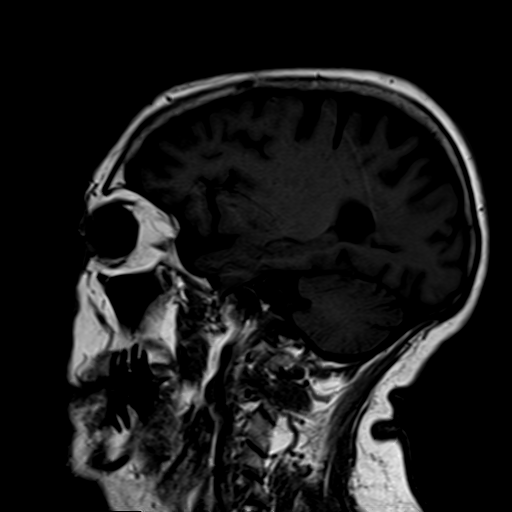
[im 28/28]
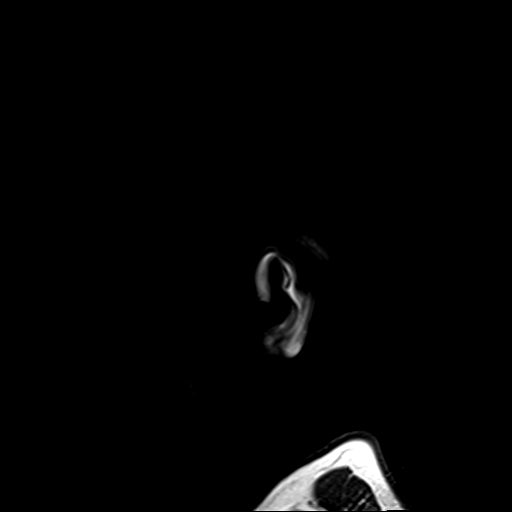

[Series 4: DWI · axial · 3.0mm · 1.80mm/px · z∈[-85,+73]mm · 6 of 50 slices shown (1 of 4)]
[im 1/50]
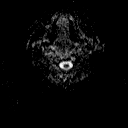
[im 10/50]
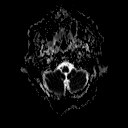
[im 20/50]
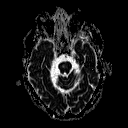
[im 30/50]
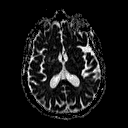
[im 40/50]
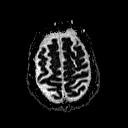
[im 50/50]
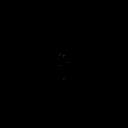

[Series 6: DWI · coronal · 3.0mm · 1.80mm/px · 6 of 49 slices shown (2 of 4)]
[im 1/49]
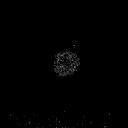
[im 10/49]
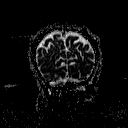
[im 20/49]
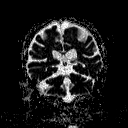
[im 29/49]
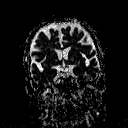
[im 39/49]
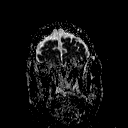
[im 49/49]
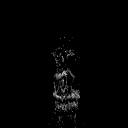

[Series 7: T2 · axial · 5.0mm · 0.60mm/px · z∈[-73,+82]mm · 3 of 25 slices shown (1 of 3)]
[im 1/25]
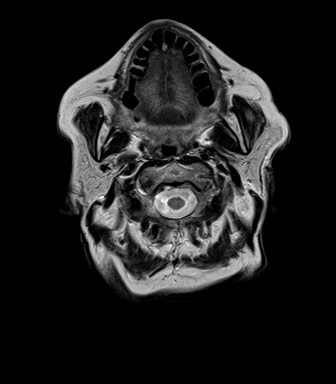
[im 13/25]
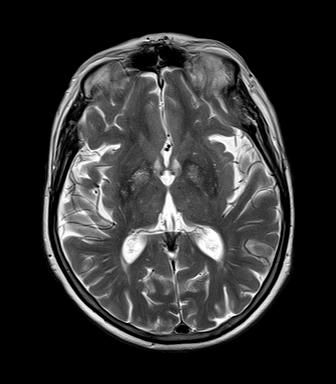
[im 25/25]
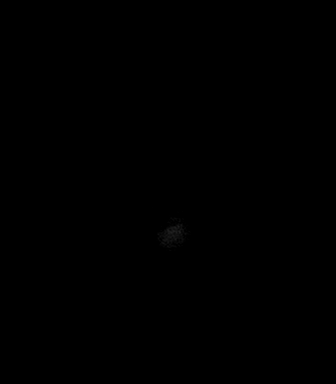

[Series 8: FLAIR · axial · 5.0mm · 0.45mm/px · z∈[-74,+81]mm · 3 of 25 slices shown]
[im 1/25]
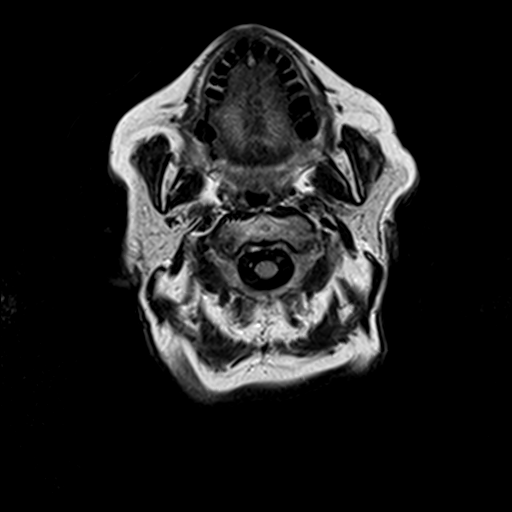
[im 13/25]
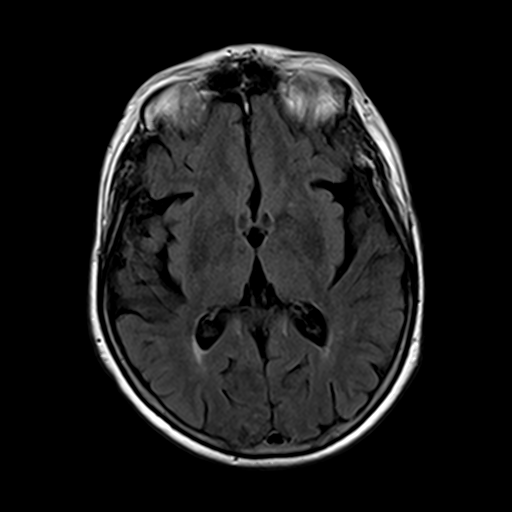
[im 25/25]
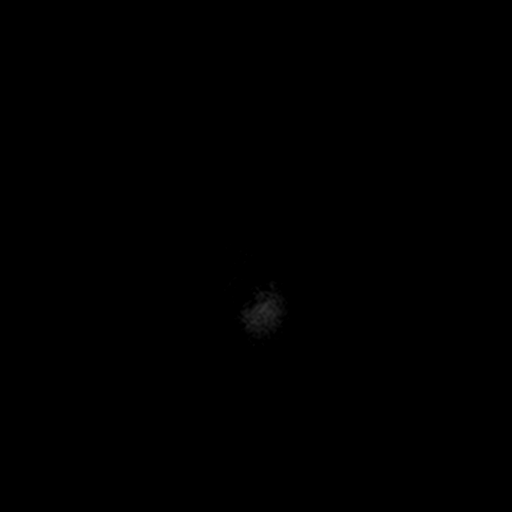

[Series 9: T2 · axial · 5.0mm · 0.45mm/px · z∈[-73,+82]mm · 3 of 25 slices shown (2 of 3)]
[im 1/25]
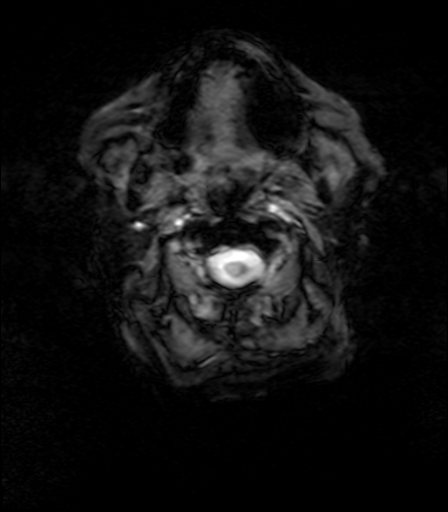
[im 13/25]
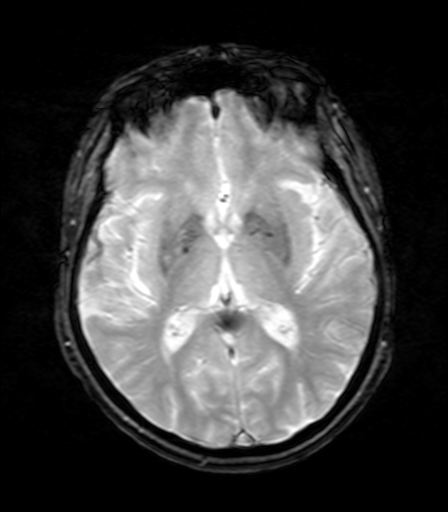
[im 25/25]
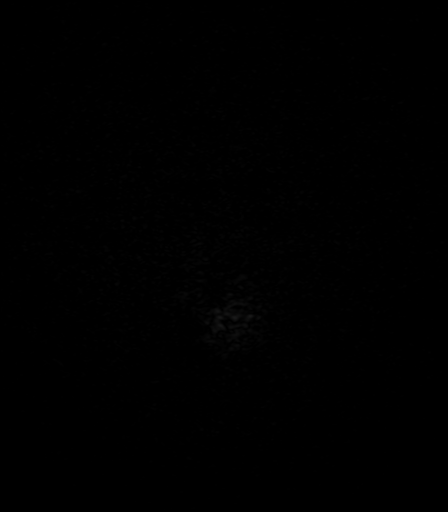

[Series 10: T1 · axial · 3.0mm · 1.00mm/px · z∈[-81,+94]mm · 7 of 60 slices shown (2 of 2)]
[im 1/60]
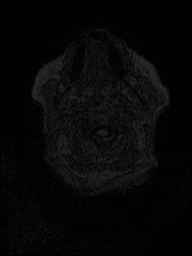
[im 10/60]
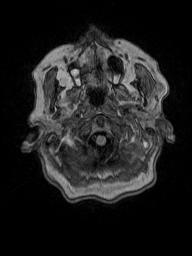
[im 20/60]
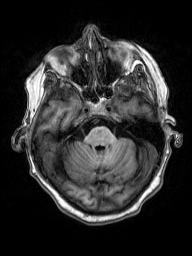
[im 30/60]
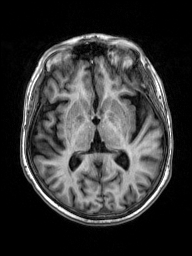
[im 40/60]
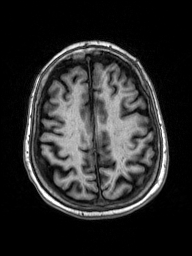
[im 50/60]
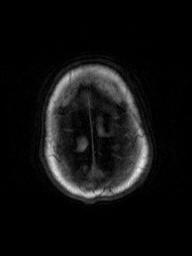
[im 60/60]
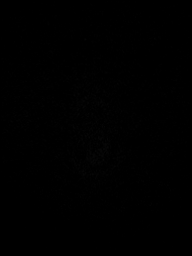

[Series 11: T2 · coronal · 5.0mm · 0.49mm/px · 4 of 30 slices shown (3 of 3)]
[im 1/30]
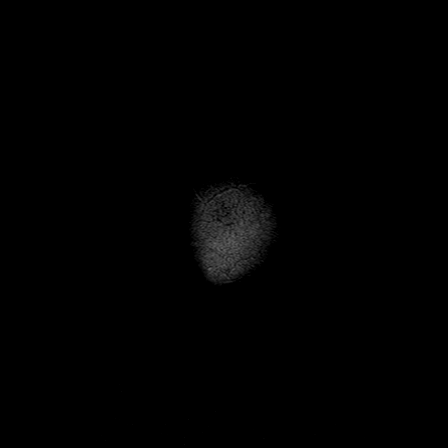
[im 10/30]
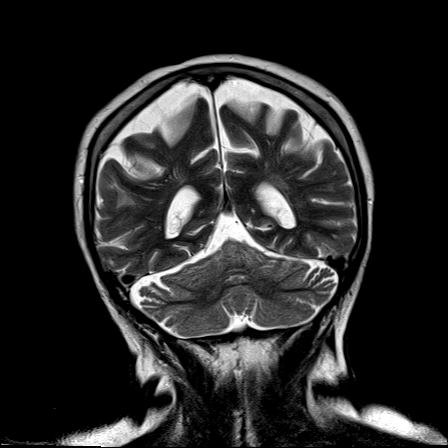
[im 20/30]
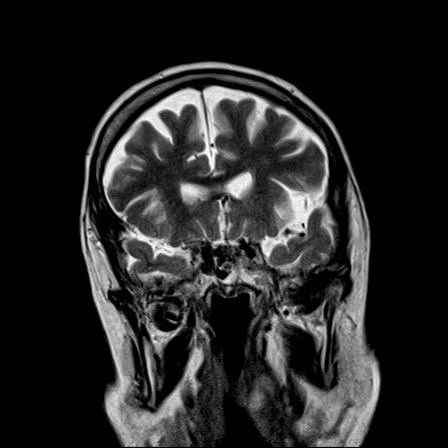
[im 30/30]
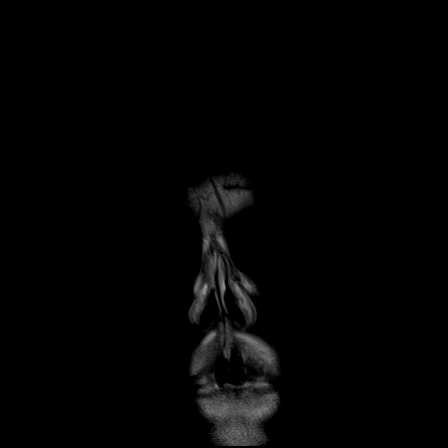

[Series 100: DWI · axial · 3.0mm · 1.80mm/px · z∈[-85,+73]mm · 6 of 51 slices shown (3 of 4)]
[im 1/51]
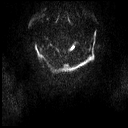
[im 11/51]
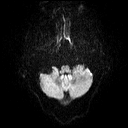
[im 21/51]
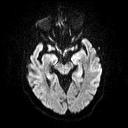
[im 31/51]
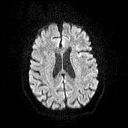
[im 41/51]
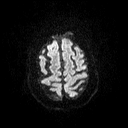
[im 51/51]
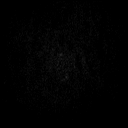

[Series 101: DWI · coronal · 3.0mm · 1.80mm/px · 6 of 49 slices shown (4 of 4)]
[im 1/49]
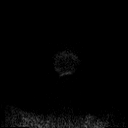
[im 10/49]
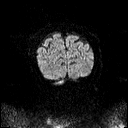
[im 20/49]
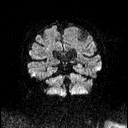
[im 29/49]
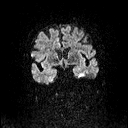
[im 39/49]
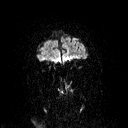
[im 49/49]
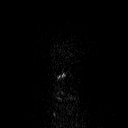

[48 of 48 positions shown; findings below may reference images not displayed]

FINDINGS: Diffusion imaging shows 5 punctate foci of restricted diffusion
consistent with micro embolic infarctions. There is a tiny focus in
the right lateral medulla. There is a 3 mm focus in the left
temporal lobe. There is a 2 mm focus in the right lateral thalamus.
There is a 2 mm focus in the inferior left frontal lobe/inferior
basal ganglia. There is a 2 mm focus in the superior medial right
thalamus. These suggest micro embolic events from the heart or
ascending aorta.

There chronic ischemic changes within the pons. There are old small
vessel cerebellar infarctions. There are mild chronic small-vessel
changes affecting the cerebral hemispheric white matter. No cortical
or large vessel territory infarction. No mass lesion, hemorrhage,
hydrocephalus or extra-axial collection. No pituitary mass. No
inflammatory sinus disease. No skull or skullbase lesion.
IMPRESSION: Five punctate foci of restricted diffusion consistent with micro
embolic infarctions affecting the right lateral medulla, left
temporal lobe, 2 foci in the right thalamus, and left inferior
frontal lobe/basal ganglia. This distribution would suggest cardiac
or ascending aortic source.

Chronic small vessel ischemic changes affecting the pons in the
cerebral hemispheric white matter.

## 2016-02-03 DIAGNOSIS — C4441 Basal cell carcinoma of skin of scalp and neck: Secondary | ICD-10-CM | POA: Diagnosis not present

## 2016-02-03 DIAGNOSIS — L905 Scar conditions and fibrosis of skin: Secondary | ICD-10-CM | POA: Diagnosis not present

## 2016-02-18 DIAGNOSIS — L905 Scar conditions and fibrosis of skin: Secondary | ICD-10-CM | POA: Diagnosis not present

## 2016-02-18 DIAGNOSIS — D0439 Carcinoma in situ of skin of other parts of face: Secondary | ICD-10-CM | POA: Diagnosis not present

## 2016-02-20 DIAGNOSIS — H00019 Hordeolum externum unspecified eye, unspecified eyelid: Secondary | ICD-10-CM | POA: Diagnosis not present

## 2016-03-23 ENCOUNTER — Other Ambulatory Visit: Payer: Self-pay | Admitting: Family Medicine

## 2016-03-23 DIAGNOSIS — G4700 Insomnia, unspecified: Secondary | ICD-10-CM

## 2016-03-23 MED ORDER — TEMAZEPAM 15 MG PO CAPS: 15.0000 mg | ORAL_CAPSULE | Freq: Every day | ORAL | Status: DC

## 2016-03-23 NOTE — Telephone Encounter (Signed)
Patient needs a refill on temazepam (RESTORIL) 15 MG capsule.  Please call patient when ready for pick up.

## 2016-03-25 ENCOUNTER — Ambulatory Visit
Admission: RE | Admit: 2016-03-25 | Discharge: 2016-03-25 | Disposition: A | Discharge status: Home / Self Care | Payer: Medicare Other | Source: Ambulatory Visit | Attending: Physician Assistant | Admitting: Physician Assistant

## 2016-03-25 ENCOUNTER — Ambulatory Visit (INDEPENDENT_AMBULATORY_CARE_PROVIDER_SITE_OTHER): Payer: Medicare Other | Admitting: Physician Assistant

## 2016-03-25 ENCOUNTER — Encounter: Payer: Self-pay | Admitting: Physician Assistant

## 2016-03-25 VITALS — BP 140/80 | HR 84 | Temp 97.2°F | Resp 16 | Wt 144.2 lb

## 2016-03-25 DIAGNOSIS — M7989 Other specified soft tissue disorders: Secondary | ICD-10-CM | POA: Diagnosis not present

## 2016-03-25 DIAGNOSIS — I739 Peripheral vascular disease, unspecified: Secondary | ICD-10-CM

## 2016-03-25 DIAGNOSIS — M79661 Pain in right lower leg: Secondary | ICD-10-CM

## 2016-03-25 NOTE — Progress Notes (Signed)
Patient: [name] Female    DOB: June 04, 1929   82 y.o.   MRN: [id number] Visit Date: 03/25/2016  Today's Provider: Mar Daring, PA-C   Chief Complaint  Patient presents with  . Joint Swelling   Subjective:    HPI Patient is here today with c/o of pain in the right calf for a week and notice swelling in right ankle last night. No injury. She states that the right calf pain only occurs when she walks, and has no pain at rest. It occurs instantly as soon as she stands up and starts to walk. She states she just walks through the pain. It does not make her stop. The right ankle swelling was new and she does not recall any injury and denies pain in it as well. She does have a strong vascular history with carotid stenosis, h/o CVA, HTN, diabetes, HLD and aortic stenosis. She is a former smoker.     Allergies  Allergen Reactions  . Cephalosporins   . Codeine   . Macrolides And Ketolides   . Penicillins   . Tramadol Nausea Only  . Sulfa Antibiotics Rash   Previous Medications   AMLODIPINE (NORVASC) 10 MG TABLET    Take by mouth.   ASPIRIN 325 MG EC TABLET    Take by mouth.   ATORVASTATIN (LIPITOR) 40 MG TABLET    Take by mouth.   BENZONATATE (TESSALON) 100 MG CAPSULE    Take by mouth. Reported on 03/25/2016   BIOTIN PO    Take by mouth.   CHOLECALCIFEROL (VITAMIN D) 1000 UNITS TABLET    Take by mouth.   ERYTHROMYCIN OPHTHALMIC OINTMENT    1 application at bedtime. Reported on 03/25/2016   HYDROCHLOROTHIAZIDE (HYDRODIURIL) 12.5 MG TABLET    Take by mouth.   OMEGA-3 FATTY ACIDS PO    Take by mouth.   OXYBUTYNIN (DITROPAN) 5 MG TABLET    Take by mouth.   POLYETHYLENE GLYCOL (MIRALAX / GLYCOLAX) PACKET    Take 17 g by mouth daily.   PREDNISOLONE ACETATE (PRED FORTE) 1 % OPHTHALMIC SUSPENSION    1 drop 4 (four) times daily. Reported on 03/25/2016   TEMAZEPAM (RESTORIL) 15 MG CAPSULE    Take 1 capsule (15 mg total) by mouth at bedtime.    Review of Systems  Constitutional:  Negative.   Respiratory: Negative for cough, chest tightness and shortness of breath.   Cardiovascular: Positive for leg swelling (swollen right ankle and pain in right calf). Negative for chest pain and palpitations.  Gastrointestinal: Negative for abdominal pain.  Musculoskeletal: Positive for joint swelling (right ankle) and gait problem (feels like charlie horse in R calf when walking). Negative for arthralgias.  Skin: Negative for color change and pallor.    Social History  Substance Use Topics  . Smoking status: Former Smoker -- 0.50 packs/day    Quit date: 11/15/1976  . Smokeless tobacco: Never Used  . Alcohol Use: No   Objective:   BP 140/80 mmHg  Pulse 84  Temp(Src) 97.2 F (36.2 C) (Oral)  Resp 16  Wt 144 lb 3.2 oz (65.409 kg)  Physical Exam  Constitutional: She appears well-developed and well-nourished. No distress.  Neck: Normal range of motion. Neck supple. No JVD present. No tracheal deviation present. No thyromegaly present.  Cardiovascular: Normal rate and regular rhythm.  Exam reveals no gallop and no friction rub.   Murmur heard.  Systolic murmur is present with a grade of 2/6  Pulses:      Dorsalis pedis pulses are 0 on the right side.       Posterior tibial pulses are 0 on the right side.  Could not palpate pedal pulses but foot was warm and cap refill was ~ 4 seconds in right foot   Pulmonary/Chest: Effort normal and breath sounds normal. No respiratory distress. She has no wheezes. She has no rales.  Musculoskeletal: She exhibits edema (trace edema R LE; mild redness at R ankle). She exhibits no tenderness.  Lymphadenopathy:    She has no cervical adenopathy.  Skin: She is not diaphoretic.  Vitals reviewed.       Assessment & Plan:     1. Claudication of calf muscles (HCC) Not classic claudication, but with history of vascular disease and unable to palpate pedal pulses will get LEAD of R LE to R/O and arterial disease as source. Will f/u pending  results.  - VAS [place] LOWER EXTREMITY ARTERIAL DUPLEX; Future  2. Right calf pain Ordered venous duplex to R/O DVT with acute ankle swelling and no pain and right calf pain.  *Venous duplex negative for DVT* Will proceed with arterial imaging. F/U pending results. She is to call if symptoms worsen. If not I will see her back in 1-2 weeks.  - US Venous Img Lower Unilateral Right; Future - VAS [place] LOWER EXTREMITY ARTERIAL DUPLEX; Future  3. Swelling of right lower extremity See above medical treatment plan. - US Venous Img Lower Unilateral Right; Future - VAS [place] LOWER EXTREMITY ARTERIAL DUPLEX; Future       Mar Daring, PA-C  [facility]

## 2016-03-25 NOTE — Patient Instructions (Signed)
Intermittent Claudication Intermittent claudication is pain in your leg that occurs when you walk or exercise and goes away when you rest. The pain can occur in one or both legs. CAUSES Intermittent claudication is caused by the buildup of plaque within the major arteries in the body (atherosclerosis). The plaque, which makes arteries stiff and narrow, prevents enough blood from reaching your leg muscles. The pain occurs when you walk or exercise because your muscles need more blood when you are moving and exercising. RISK FACTORS Risk factors include:  A family history of atherosclerosis.  A personal history of stroke or heart disease.  Older age.  Being inactive or overweight.  Smoking cigarettes.  Having another health condition such as:  Diabetes.  High blood pressure.  High cholesterol. SIGNS AND SYMPTOMS  Your hip or leg may:   Ache.  Cramp.  Feel tight.  Feel weak.  Feel heavy. Over time, you may feel pain in your calf, thigh, or hip. DIAGNOSIS  Your health care provider may diagnose intermittent claudication based on your symptoms and medical history. Your health care provider may also do tests to learn more about your condition. These may include:  Blood tests.  An ultrasound.  Imaging tests such as angiography, magnetic resonance angiography (MRA), and computed tomography angiography (CTA). TREATMENT You may be treated for problems such as:  High blood pressure.  High cholesterol.  Diabetes. Other treatments may include:  Lifestyle changes such as:  Starting an exercise program.  Losing weight.  Quitting smoking.  Medicines to help restore blood flow through your legs.  Blood vessel surgery (angioplasty) to restore blood flow if your intermittent claudication is caused by severe peripheral artery disease. HOME CARE INSTRUCTIONS  Manage any other health conditions you have.  Eat a diet low in saturated fats and calories to maintain a  healthy weight.  Quit smoking, if you smoke.  Take medicines only as directed by your health care provider.  If your health care provider recommended an exercise program for you, follow it as directed. Your exercise program may involve:  Walking three or more times a week.  Walking until you have certain symptoms of intermittent claudication.  Resting until symptoms go away.  Gradually increasing walking time to about 50 minutes a day. SEEK MEDICAL CARE IF: Your condition is not getting better or is getting worse. SEEK IMMEDIATE MEDICAL CARE IF:   You have chest pain.  You have difficulty breathing.  You develop arm weakness.  You have trouble speaking.  Your face begins to droop. MAKE SURE YOU:  Understand these instructions.  Will watch your condition.  Will get help if you are not doing well or get worse.   This information is not intended to replace advice given to you by your health care provider. Make sure you discuss any questions you have with your health care provider.   Document Released: 09/04/2004 Document Revised: 11/23/2014 Document Reviewed: 02/08/2014 Elsevier Interactive Patient Education 2016 [employer]  

## 2016-04-02 ENCOUNTER — Telehealth: Payer: Self-pay | Admitting: Physician Assistant

## 2016-04-02 DIAGNOSIS — E782 Mixed hyperlipidemia: Secondary | ICD-10-CM | POA: Diagnosis not present

## 2016-04-02 DIAGNOSIS — I350 Nonrheumatic aortic (valve) stenosis: Secondary | ICD-10-CM | POA: Diagnosis not present

## 2016-04-02 DIAGNOSIS — I10 Essential (primary) hypertension: Secondary | ICD-10-CM | POA: Diagnosis not present

## 2016-04-02 DIAGNOSIS — I6529 Occlusion and stenosis of unspecified carotid artery: Secondary | ICD-10-CM | POA: Diagnosis not present

## 2016-04-02 NOTE — Telephone Encounter (Signed)
I have already ordered a lower extremity arterial duplex with ABIs. I ordered that day.

## 2016-04-02 NOTE — Telephone Encounter (Signed)
Pt states that she still has pain in back of right calf,swelling in foot/ankle.She thought another test was supposed to be ordered

## 2016-04-03 ENCOUNTER — Telehealth: Payer: Self-pay | Admitting: Physician Assistant

## 2016-04-03 NOTE — Telephone Encounter (Signed)
Order faxed to [facility].She will contact pt with appointment

## 2016-04-03 NOTE — Telephone Encounter (Signed)
Can you wright order on prescription pad for me to send to [facility] states they do not do test that was ordered

## 2016-04-03 NOTE — Telephone Encounter (Signed)
Done

## 2016-04-03 NOTE — Addendum Note (Signed)
Addended by: Mar Daring on: 04/03/2016 09:58 AM   Modules accepted: Orders

## 2016-04-14 DIAGNOSIS — E785 Hyperlipidemia, unspecified: Secondary | ICD-10-CM | POA: Diagnosis not present

## 2016-04-14 DIAGNOSIS — M7989 Other specified soft tissue disorders: Secondary | ICD-10-CM | POA: Diagnosis not present

## 2016-04-14 DIAGNOSIS — M79661 Pain in right lower leg: Secondary | ICD-10-CM | POA: Diagnosis not present

## 2016-04-14 DIAGNOSIS — I639 Cerebral infarction, unspecified: Secondary | ICD-10-CM | POA: Diagnosis not present

## 2016-04-14 DIAGNOSIS — M1990 Unspecified osteoarthritis, unspecified site: Secondary | ICD-10-CM | POA: Diagnosis not present

## 2016-04-14 DIAGNOSIS — I6523 Occlusion and stenosis of bilateral carotid arteries: Secondary | ICD-10-CM | POA: Diagnosis not present

## 2016-04-14 DIAGNOSIS — I739 Peripheral vascular disease, unspecified: Secondary | ICD-10-CM | POA: Diagnosis not present

## 2016-04-14 DIAGNOSIS — I10 Essential (primary) hypertension: Secondary | ICD-10-CM | POA: Diagnosis not present

## 2016-04-15 ENCOUNTER — Encounter: Payer: Self-pay | Admitting: Family Medicine

## 2016-04-15 ENCOUNTER — Ambulatory Visit (INDEPENDENT_AMBULATORY_CARE_PROVIDER_SITE_OTHER): Payer: Medicare Other | Admitting: Family Medicine

## 2016-04-15 VITALS — BP 114/64 | HR 72 | Temp 98.2°F | Resp 20 | Ht 61.25 in | Wt 141.0 lb

## 2016-04-15 DIAGNOSIS — E119 Type 2 diabetes mellitus without complications: Secondary | ICD-10-CM | POA: Insufficient documentation

## 2016-04-15 DIAGNOSIS — R350 Frequency of micturition: Secondary | ICD-10-CM

## 2016-04-15 DIAGNOSIS — I10 Essential (primary) hypertension: Secondary | ICD-10-CM | POA: Diagnosis not present

## 2016-04-15 DIAGNOSIS — E785 Hyperlipidemia, unspecified: Secondary | ICD-10-CM

## 2016-04-15 DIAGNOSIS — Z0000 Encounter for general adult medical examination without abnormal findings: Secondary | ICD-10-CM | POA: Diagnosis not present

## 2016-04-15 DIAGNOSIS — G4700 Insomnia, unspecified: Secondary | ICD-10-CM

## 2016-04-15 MED ORDER — AMLODIPINE BESYLATE 10 MG PO TABS: 10.0000 mg | ORAL_TABLET | Freq: Every day | ORAL | Status: DC

## 2016-04-15 MED ORDER — TEMAZEPAM 15 MG PO CAPS: 15.0000 mg | ORAL_CAPSULE | Freq: Every day | ORAL | Status: DC

## 2016-04-15 MED ORDER — OXYBUTYNIN CHLORIDE 5 MG PO TABS: 5.0000 mg | ORAL_TABLET | Freq: Two times a day (BID) | ORAL | Status: DC

## 2016-04-15 MED ORDER — HYDROCHLOROTHIAZIDE 12.5 MG PO TABS: 12.5000 mg | ORAL_TABLET | Freq: Every day | ORAL | Status: DC

## 2016-04-15 MED ORDER — ATORVASTATIN CALCIUM 40 MG PO TABS: 40.0000 mg | ORAL_TABLET | Freq: Every day | ORAL | Status: DC

## 2016-04-15 NOTE — Progress Notes (Signed)
Patient ID: [name], female   DOB: 1928-12-12, 82 y.o.   MRN: [id number]       Patient: [name], Female    DOB: August 05, 1929, 82 y.o.   MRN: [id number] Visit Date: 04/15/2016  Today's Provider: Margarita Rana, MD   Chief Complaint  Patient presents with  . Medicare Wellness   Subjective:    Annual wellness visit [name] is a 82 y.o. female. She feels well. She reports exercising active with daily activites. She reports she is sleeping well.  04/08/15 AWE 12/23/06 Pap-neg 09/10/15 Mammogram-BI-RADS 1 03/15/12 BMD-osteopenia, recheck in 2-3 years 05/07/09 Colonoscopy-diverticulosis, polyps, Dr. Vira Agar -----------------------------------------------------------   Review of Systems  Constitutional: Negative.   HENT: Negative.   Eyes: Positive for discharge.  Respiratory: Negative.   Cardiovascular: Positive for leg swelling.  Gastrointestinal: Negative.   Endocrine: Positive for polyuria.  Genitourinary: Negative.   Musculoskeletal: Negative.   Skin: Negative.   Allergic/Immunologic: Negative.   Neurological: Negative.   Hematological: Negative.   Psychiatric/Behavioral: Negative.     Social History   Social History  . Marital Status: Widowed    Spouse Name: N/A  . Number of Children: N/A  . Years of Education: N/A   Occupational History  . Not on file.   Social History Main Topics  . Smoking status: Former Smoker -- 0.50 packs/day    Quit date: 11/15/1976  . Smokeless tobacco: Never Used  . Alcohol Use: No  . Drug Use: No  . Sexual Activity: Not on file   Other Topics Concern  . Not on file   Social History Narrative    Past Medical History  Diagnosis Date  . Cancer [facility])     skin     Patient Active Problem List   Diagnosis Date Noted  . Allergic rhinitis 08/05/2015  . Aortic heart valve narrowing 08/05/2015  . Breast cyst 08/05/2015  . Carotid artery narrowing 08/05/2015  . Colon polyp 08/05/2015  . Cerebral vascular  accident ([place]) 08/05/2015  . Diabetes mellitus, type 2 ([place]) 08/05/2015  . DD (diverticular disease) 08/05/2015  . Elevated CK 08/05/2015  . Acid reflux 08/05/2015  . Headache disorder 08/05/2015  . Calcium blood increased 08/05/2015  . HLD (hyperlipidemia) 08/05/2015  . BP (high blood pressure) 08/05/2015  . Cannot sleep 08/05/2015  . LBP (low back pain) 08/05/2015  . OP (osteoporosis) 08/05/2015  . Adiposity 08/05/2015  . Abnormal Pap smear of vagina 08/05/2015  . Hemorrhage, postmenopausal 08/05/2015  . Superficial thrombophlebitis 08/05/2015  . FOM (frequency of micturition) 08/05/2015  . Fatigue 08/05/2015    Past Surgical History  Procedure Laterality Date  . Carotid artery angioplasty Left 01/18/2015    Dr. Delana Meyer [facility]  . Cholecystectomy  06/27/2012  . Breast surgery      1980's  . Decompressive lumbar laminectomy level 1  1950  . Eye surgery Right 2012    cataract extraction  . Breast biopsy Right     neg    Her family history includes Alzheimer's disease in her brother; Dementia in her brother; Gout in her brother; Heart attack in her brother and brother; Heart disease in her brother; Ovarian cancer in her mother.    Previous Medications   AMLODIPINE (NORVASC) 10 MG TABLET    Take 10 mg by mouth daily.    ASPIRIN 325 MG EC TABLET    Take 325 mg by mouth daily.    ATORVASTATIN (LIPITOR) 40 MG TABLET    Take 40 mg by mouth  daily at 6 PM.    BIOTIN PO    Take 1 tablet by mouth daily.    CHOLECALCIFEROL (VITAMIN D) 1000 UNITS TABLET    Take 1,000 Units by mouth daily.    ERYTHROMYCIN OPHTHALMIC OINTMENT    1 application at bedtime. Reported on 03/25/2016   HYDROCHLOROTHIAZIDE (HYDRODIURIL) 12.5 MG TABLET    Take 12.5 mg by mouth daily.    OMEGA-3 FATTY ACIDS PO    Take 2 capsules by mouth daily.    OXYBUTYNIN (DITROPAN) 5 MG TABLET    Take 5 mg by mouth 2 (two) times daily.    POLYETHYLENE GLYCOL (MIRALAX / GLYCOLAX) PACKET    Take 17 g by mouth daily.   TEMAZEPAM  (RESTORIL) 15 MG CAPSULE    Take 1 capsule (15 mg total) by mouth at bedtime.    Patient Care Team: Margarita Rana, MD as PCP - General (Family Medicine)     Objective:   Vitals: BP 114/64 mmHg  Pulse 72  Temp(Src) 98.2 F (36.8 C) (Oral)  Resp 20  Ht 5' 1.25" (1.556 m)  Wt 141 lb (63.957 kg)  BMI 26.42 kg/m2  Physical Exam  Constitutional: She is oriented to person, place, and time. She appears well-developed and well-nourished.  HENT:  Head: Normocephalic and atraumatic.  Right Ear: Tympanic membrane, external ear and ear canal normal.  Left Ear: Tympanic membrane, external ear and ear canal normal.  Nose: Nose normal.  Mouth/Throat: Uvula is midline, oropharynx is clear and moist and mucous membranes are normal.  Eyes: Conjunctivae, EOM and lids are normal. Pupils are equal, round, and reactive to light.  Neck: Trachea normal and normal range of motion. Neck supple. Carotid bruit is not present. No thyroid mass and no thyromegaly present.  Cardiovascular: Normal rate and regular rhythm.   Murmur heard.  Systolic murmur is present with a grade of 3/6  Ejection   Pulmonary/Chest: Effort normal and breath sounds normal.  Abdominal: Soft. Normal appearance and bowel sounds are normal. There is no hepatosplenomegaly. There is no tenderness.  Genitourinary: No breast swelling, tenderness or discharge.  Musculoskeletal: Normal range of motion.  Lymphadenopathy:    She has no cervical adenopathy.    She has no axillary adenopathy.  Neurological: She is alert and oriented to person, place, and time. She has normal strength. No cranial nerve deficit.  Skin: Skin is warm, dry and intact.  Psychiatric: She has a normal mood and affect. Her speech is normal and behavior is normal. Judgment and thought content normal. Cognition and memory are normal.    Activities of Daily Living In your present state of health, do you have any difficulty performing the following activities:  04/15/2016  Hearing? N  Vision? N  Difficulty concentrating or making decisions? N  Walking or climbing stairs? N  Dressing or bathing? N  Doing errands, shopping? N    Fall Risk Assessment Fall Risk  04/15/2016  Falls in the past year? No     Depression Screen PHQ 2/9 Scores 04/15/2016  PHQ - 2 Score 0    Cognitive Testing - 6-CIT  Correct? Score   What year is it? yes 0 0 or 4  What month is it? yes 0 0 or 3  Memorize:    Pia Mau,  44,  [place],      What time is it? (within 1 hour) yes 0 0 or 3  Count backwards from 20 yes 0 0, 2, or 4  Name the months of the year yes 0 0, 2, or 4  Repeat name & address above no 4 0, 2, 4, 6, 8, or 10       TOTAL SCORE  4/28   Interpretation:  Normal  Normal (0-7) Abnormal (8-28)       Assessment & Plan:     Annual Wellness Visit  Reviewed patient's Family Medical History Reviewed and updated list of patient's medical providers Assessment of cognitive impairment was done Assessed patient's functional ability Established a written schedule for health screening [place] Completed and Reviewed  Exercise Activities and Dietary recommendations Goals    None      Immunization History  Administered Date(s) Administered  . Influenza, High Dose Seasonal PF 09/05/2015  . Pneumococcal Conjugate-13 04/02/2014  . Pneumococcal Polysaccharide-23 12/08/2004  . Zoster 03/03/2010      1. Medicare annual wellness visit, subsequent Stable. Patient advised to continue eating healthy and exercise daily.  2. Essential hypertension - CBC with Differential/Platelet - Comprehensive metabolic panel - hydrochlorothiazide (HYDRODIURIL) 12.5 MG tablet; Take 1 tablet (12.5 mg total) by mouth daily.  Dispense: 90 tablet; Refill: 3 - amLODipine (NORVASC) 10 MG tablet; Take 1 tablet (10 mg total) by mouth daily.  Dispense: 90 tablet; Refill: 3  3. Diabetes mellitus without complication ([place]) - Hemoglobin  A1c  4. HLD (hyperlipidemia) - Lipid Panel With LDL/HDL Ratio - TSH - atorvastatin (LIPITOR) 40 MG tablet; Take 1 tablet (40 mg total) by mouth daily at 6 PM.  Dispense: 90 tablet; Refill: 3  5. Cannot sleep - temazepam (RESTORIL) 15 MG capsule; Take 1 capsule (15 mg total) by mouth at bedtime.  Dispense: 90 capsule; Refill: 1  6. FOM (frequency of micturition) - oxybutynin (DITROPAN) 5 MG tablet; Take 1 tablet (5 mg total) by mouth 2 (two) times daily.  Dispense: 180 tablet; Refill: 3     Patient seen and examined by Dr. [name] Belfast, and note scribed by Philbert Riser. Dimas, CMA.  I have reviewed the document for accuracy and completeness and I agree with above. [name] Belfast, MD   Margarita Rana, MD   ------------------------------------------------------------------------------------------------------------

## 2016-04-16 ENCOUNTER — Telehealth: Payer: Self-pay

## 2016-04-16 LAB — CBC WITH DIFFERENTIAL/PLATELET
BASOS ABS: 0 10*3/uL (ref 0.0–0.2)
Basos: 0 %
EOS (ABSOLUTE): 0.1 10*3/uL (ref 0.0–0.4)
EOS: 1 %
HEMATOCRIT: 44 % (ref 34.0–46.6)
HEMOGLOBIN: 15.2 g/dL (ref 11.1–15.9)
IMMATURE GRANULOCYTES: 0 %
Immature Grans (Abs): 0 10*3/uL (ref 0.0–0.1)
Lymphocytes Absolute: 2.8 10*3/uL (ref 0.7–3.1)
Lymphs: 37 %
MCH: 29.8 pg (ref 26.6–33.0)
MCHC: 34.5 g/dL (ref 31.5–35.7)
MCV: 86 fL (ref 79–97)
MONOCYTES: 6 %
Monocytes Absolute: 0.5 10*3/uL (ref 0.1–0.9)
NEUTROS PCT: 56 %
Neutrophils Absolute: 4.2 10*3/uL (ref 1.4–7.0)
Platelets: 178 10*3/uL (ref 150–379)
RBC: 5.1 x10E6/uL (ref 3.77–5.28)
RDW: 13.3 % (ref 12.3–15.4)
WBC: 7.6 10*3/uL (ref 3.4–10.8)

## 2016-04-16 LAB — COMPREHENSIVE METABOLIC PANEL
ALT: 24 IU/L (ref 0–32)
AST: 20 IU/L (ref 0–40)
Albumin/Globulin Ratio: 1.7 (ref 1.2–2.2)
Albumin: 4.7 g/dL (ref 3.5–4.7)
Alkaline Phosphatase: 82 IU/L (ref 39–117)
BUN / CREAT RATIO: 14 (ref 12–28)
BUN: 11 mg/dL (ref 8–27)
Bilirubin Total: 0.7 mg/dL (ref 0.0–1.2)
CALCIUM: 10.2 mg/dL (ref 8.7–10.3)
CO2: 23 mmol/L (ref 18–29)
Chloride: 100 mmol/L (ref 96–106)
Creatinine, Ser: 0.76 mg/dL (ref 0.57–1.00)
GFR, EST AFRICAN AMERICAN: 83 mL/min/{1.73_m2} (ref >59)
GFR, EST NON AFRICAN AMERICAN: 72 mL/min/{1.73_m2} (ref >59)
GLOBULIN, TOTAL: 2.7 g/dL (ref 1.5–4.5)
GLUCOSE: 162 mg/dL — AB (ref 65–99)
POTASSIUM: 4.9 mmol/L (ref 3.5–5.2)
Sodium: 142 mmol/L (ref 134–144)
Total Protein: 7.4 g/dL (ref 6.0–8.5)

## 2016-04-16 LAB — LIPID PANEL WITH LDL/HDL RATIO
Cholesterol, Total: 167 mg/dL (ref 100–199)
HDL: 44 mg/dL (ref >39)
LDL Calculated: 82 mg/dL (ref 0–99)
LDL/HDL RATIO: 1.9 ratio (ref 0.0–3.2)
Triglycerides: 204 mg/dL — ABNORMAL HIGH (ref 0–149)
VLDL CHOLESTEROL CAL: 41 mg/dL — AB (ref 5–40)

## 2016-04-16 LAB — HEMOGLOBIN A1C
ESTIMATED AVERAGE GLUCOSE: 166 mg/dL
HEMOGLOBIN A1C: 7.4 % — AB (ref 4.8–5.6)

## 2016-04-16 LAB — TSH: TSH: 1.8 u[IU]/mL (ref 0.450–4.500)

## 2016-04-16 NOTE — Telephone Encounter (Signed)
-----   Message from Margarita Rana, MD sent at 04/16/2016  6:32 AM EDT ----- Labs stable. Blood sugar not as good as previous but not enough to change her medication. Recheck in 6 months. Thanks.

## 2016-04-16 NOTE — Telephone Encounter (Signed)
Left message to call back  

## 2016-04-20 ENCOUNTER — Encounter: Payer: Self-pay | Admitting: Family Medicine

## 2016-04-20 DIAGNOSIS — H01003 Unspecified blepharitis right eye, unspecified eyelid: Secondary | ICD-10-CM | POA: Diagnosis not present

## 2016-04-21 NOTE — Telephone Encounter (Signed)
Pt advised as directed below.   Thanks,   -Tambra Muller  

## 2016-04-21 NOTE — Telephone Encounter (Signed)
LMTCB 04/21/2016  Thanks,   -Mickel Baas

## 2016-04-22 ENCOUNTER — Telehealth: Payer: Self-pay | Admitting: Family Medicine

## 2016-04-22 NOTE — Telephone Encounter (Signed)
FYI

## 2016-04-22 NOTE — Telephone Encounter (Signed)
Scott with [facility] is calling in regards to a non-formulary exception request for Temazepam 15mg ,.  This has been approved for 1 year as of today.  [id number]

## 2016-05-22 DIAGNOSIS — H6121 Impacted cerumen, right ear: Secondary | ICD-10-CM | POA: Diagnosis not present

## 2016-05-22 DIAGNOSIS — H902 Conductive hearing loss, unspecified: Secondary | ICD-10-CM | POA: Diagnosis not present

## 2016-06-08 DIAGNOSIS — I872 Venous insufficiency (chronic) (peripheral): Secondary | ICD-10-CM | POA: Diagnosis not present

## 2016-06-08 DIAGNOSIS — Z85828 Personal history of other malignant neoplasm of skin: Secondary | ICD-10-CM | POA: Diagnosis not present

## 2016-06-08 DIAGNOSIS — Z08 Encounter for follow-up examination after completed treatment for malignant neoplasm: Secondary | ICD-10-CM | POA: Diagnosis not present

## 2016-06-08 DIAGNOSIS — L570 Actinic keratosis: Secondary | ICD-10-CM | POA: Diagnosis not present

## 2016-06-08 DIAGNOSIS — X32XXXA Exposure to sunlight, initial encounter: Secondary | ICD-10-CM | POA: Diagnosis not present

## 2016-06-08 DIAGNOSIS — R202 Paresthesia of skin: Secondary | ICD-10-CM | POA: Diagnosis not present

## 2016-08-17 ENCOUNTER — Telehealth: Payer: Self-pay | Admitting: Physician Assistant

## 2016-08-17 DIAGNOSIS — E119 Type 2 diabetes mellitus without complications: Secondary | ICD-10-CM

## 2016-08-17 MED ORDER — GLUCOSE BLOOD VI STRP: ORAL_STRIP | 12 refills | Status: DC

## 2016-08-17 NOTE — Telephone Encounter (Signed)
Order sent.

## 2016-08-17 NOTE — Telephone Encounter (Signed)
Patient requesting a Rx for One Touch UltraMini (Delica) blue test strips  [facility]

## 2016-08-21 DIAGNOSIS — Z23 Encounter for immunization: Secondary | ICD-10-CM | POA: Diagnosis not present

## 2016-09-07 ENCOUNTER — Encounter: Payer: Self-pay | Admitting: Physician Assistant

## 2016-09-07 ENCOUNTER — Ambulatory Visit (INDEPENDENT_AMBULATORY_CARE_PROVIDER_SITE_OTHER): Payer: Medicare Other | Admitting: Physician Assistant

## 2016-09-07 VITALS — BP 110/60 | HR 72 | Temp 98.1°F | Resp 16 | Ht 61.0 in | Wt 142.0 lb

## 2016-09-07 DIAGNOSIS — E119 Type 2 diabetes mellitus without complications: Secondary | ICD-10-CM | POA: Diagnosis not present

## 2016-09-07 DIAGNOSIS — E7800 Pure hypercholesterolemia, unspecified: Secondary | ICD-10-CM | POA: Diagnosis not present

## 2016-09-07 DIAGNOSIS — I10 Essential (primary) hypertension: Secondary | ICD-10-CM

## 2016-09-07 DIAGNOSIS — H8112 Benign paroxysmal vertigo, left ear: Secondary | ICD-10-CM

## 2016-09-07 LAB — POCT GLYCOSYLATED HEMOGLOBIN (HGB A1C)
ESTIMATED AVERAGE GLUCOSE: 151
HEMOGLOBIN A1C: 6.9

## 2016-09-07 NOTE — Progress Notes (Addendum)
Patient: [name] Female    DOB: 1929-09-19   82 y.o.   MRN: [id number] Visit Date: 09/07/2016  Today's Provider: Mar Daring, PA-C   Chief Complaint  Patient presents with  . Diabetes  . Hypertension  . Dizziness   Subjective:    HPI  Diabetes Mellitus Type II, Follow-up:   Lab Results  Component Value Date   HGBA1C 6.9 09/07/2016   HGBA1C 7.4 (H) 04/15/2016   HGBA1C 6.9 (H) 08/05/2015    Last seen for diabetes 5 months ago.  Management since then includes no changes. She reports excellent compliance with treatment. She is not having side effects.  Current symptoms include none and have been stable. Home blood sugar records: not being checked  Episodes of hypoglycemia? no   Current Insulin Regimen:  Most Recent Eye Exam:  Weight trend: stable Prior visit with dietician: no Current diet: in general, a "healthy" diet   Current exercise: housecleaning  Pertinent Labs:    Component Value Date/Time   CHOL 167 04/15/2016 1012   CHOL 177 12/16/2014 0315   TRIG 204 (H) 04/15/2016 1012   TRIG 233 (H) 12/16/2014 0315   HDL 44 04/15/2016 1012   HDL 38 (L) 12/16/2014 0315   LDLCALC 82 04/15/2016 1012   LDLCALC 92 12/16/2014 0315   CREATININE 0.76 04/15/2016 1012   CREATININE 0.94 12/16/2014 0315    Wt Readings from Last 3 Encounters:  09/07/16 142 lb (64.4 kg)  04/15/16 141 lb (64 kg)  03/25/16 144 lb 3.2 oz (65.4 kg)    ------------------------------------------------------------------------  Hypertension, follow-up:  BP Readings from Last 3 Encounters:  09/07/16 110/60  04/15/16 114/64  03/25/16 140/80    She was last seen for hypertension 5 months ago.  BP at that visit was 114/64. Management changes since that visit include no changes. She reports excellent compliance with treatment. She is not having side effects.  She is not exercising. She is adherent to low salt diet.   Outside blood pressures are stable. She is  experiencing dizziness.  Patient denies chest pain and lower extremity edema.   Cardiovascular risk factors include none.  Use of agents associated with hypertension: none.     Weight trend: stable Wt Readings from Last 3 Encounters:  09/07/16 142 lb (64.4 kg)  04/15/16 141 lb (64 kg)  03/25/16 144 lb 3.2 oz (65.4 kg)    Current diet: in general, a "healthy" diet    ------------------------------------------------------------------------ Vertigo - Dizziness: Patient presents with dizziness .  The dizziness has been present for 1 month. The patient describes the symptoms as disequalibirum and vertigo. Symptoms are exacerbated by rapid head movements, rolling over in bed and bending The patient also complains of none. Patient denies tinnitus.  She has been treated with nothing.  Previous work up has been none.     Allergies  Allergen Reactions  . Cephalosporins   . Codeine   . Macrolides And Ketolides   . Penicillins   . Tramadol Nausea Only  . Sulfa Antibiotics Rash     Current Outpatient Prescriptions:  .  amLODipine (NORVASC) 10 MG tablet, Take 1 tablet (10 mg total) by mouth daily., Disp: 90 tablet, Rfl: 3 .  aspirin 325 MG EC tablet, Take 325 mg by mouth daily. , Disp: , Rfl:  .  atorvastatin (LIPITOR) 40 MG tablet, Take 1 tablet (40 mg total) by mouth daily at 6 PM., Disp: 90 tablet, Rfl: 3 .  BIOTIN PO,  Take 1 tablet by mouth daily. , Disp: , Rfl:  .  cholecalciferol (VITAMIN D) 1000 UNITS tablet, Take 1,000 Units by mouth daily. , Disp: , Rfl:  .  erythromycin ophthalmic ointment, 1 application at bedtime. Reported on 03/25/2016, Disp: , Rfl:  .  glucose blood (ONE TOUCH ULTRA TEST) test strip, Check blood glucose once daily, Disp: 100 each, Rfl: 12 .  hydrochlorothiazide (HYDRODIURIL) 12.5 MG tablet, Take 1 tablet (12.5 mg total) by mouth daily., Disp: 90 tablet, Rfl: 3 .  OMEGA-3 FATTY ACIDS PO, Take 2 capsules by mouth daily. , Disp: , Rfl:  .  oxybutynin (DITROPAN) 5  MG tablet, Take 1 tablet (5 mg total) by mouth 2 (two) times daily., Disp: 180 tablet, Rfl: 3 .  polyethylene glycol (MIRALAX / GLYCOLAX) packet, Take 17 g by mouth daily., Disp: , Rfl:  .  temazepam (RESTORIL) 15 MG capsule, Take 1 capsule (15 mg total) by mouth at bedtime., Disp: 90 capsule, Rfl: 1  Review of Systems  Constitutional: Negative.   Respiratory: Negative.   Cardiovascular: Negative.   Gastrointestinal: Negative.   Neurological: Positive for dizziness. Negative for seizures, syncope, facial asymmetry, weakness, light-headedness, numbness and headaches.    Social History  Substance Use Topics  . Smoking status: Former Smoker    Packs/day: 0.50    Quit date: 11/15/1976  . Smokeless tobacco: Never Used  . Alcohol use No   Objective:   BP 110/60 (BP Location: Left Arm, Patient Position: Sitting, Cuff Size: Large)   Pulse 72   Temp 98.1 F (36.7 C) (Oral)   Resp 16   Ht 5\' 1"  (1.549 m)   Wt 142 lb (64.4 kg)   BMI 26.83 kg/m   Physical Exam  Constitutional: She is oriented to person, place, and time. She appears well-developed and well-nourished. No distress.  Neck: Normal range of motion. Neck supple.  Cardiovascular: Normal rate and regular rhythm.  Exam reveals no gallop and no friction rub.   Murmur heard.  Systolic murmur is present with a grade of 2/6  Pulmonary/Chest: Effort normal and breath sounds normal. No respiratory distress. She has no wheezes. She has no rales.  Abdominal: Soft. Bowel sounds are normal. She exhibits no distension and no mass. There is no tenderness.  Musculoskeletal: She exhibits no edema.  Neurological: She is alert and oriented to person, place, and time. No cranial nerve deficit. Coordination normal.  Skin: She is not diaphoretic.  Psychiatric: She has a normal mood and affect. Her behavior is normal. Judgment and thought content normal.  Vitals reviewed.      Assessment & Plan:     1. Diabetes mellitus without complication  (HCC) [id number] improved back to 6.9. Continue lifestyle modifications. Will see her back in May 2018 for AWV. She is to call if she needs anything in the meantime. - POCT glycosylated hemoglobin (Hb A1C)  2. Benign paroxysmal positional vertigo of left ear Occasional. Notices if she is on left side and rolls over to right side. Does not cause nausea. Advised to stay well hydrated and demonstrated to patient how to perform Brandt-Daroff exercises. She is to call if symptoms worsen.  3. Essential hypertension Stable. Will check labs in May.  4. Pure hypercholesterolemia Stable. Will check labs in May.       Mar Daring, PA-C  [facility]

## 2016-09-07 NOTE — Patient Instructions (Signed)
Benign Positional Vertigo Vertigo is the feeling that you or your surroundings are moving when they are not. Benign positional vertigo is the most common form of vertigo. The cause of this condition is not serious (is benign). This condition is triggered by certain movements and positions (is positional). This condition can be dangerous if it occurs while you are doing something that could endanger you or others, such as driving.  CAUSES In many cases, the cause of this condition is not known. It may be caused by a disturbance in an area of the inner ear that helps your brain to sense movement and balance. This disturbance can be caused by a viral infection (labyrinthitis), head injury, or repetitive motion. RISK FACTORS This condition is more likely to develop in:  Women.  People who are 50 years of age or older. SYMPTOMS Symptoms of this condition usually happen when you move your head or your eyes in different directions. Symptoms may start suddenly, and they usually last for less than a minute. Symptoms may include:  Loss of balance and falling.  Feeling like you are spinning or moving.  Feeling like your surroundings are spinning or moving.  Nausea and vomiting.  Blurred vision.  Dizziness.  Involuntary eye movement (nystagmus). Symptoms can be mild and cause only slight annoyance, or they can be severe and interfere with daily life. Episodes of benign positional vertigo may return (recur) over time, and they may be triggered by certain movements. Symptoms may improve over time. DIAGNOSIS This condition is usually diagnosed by medical history and a physical exam of the head, neck, and ears. You may be referred to a health care provider who specializes in ear, nose, and throat (ENT) problems (otolaryngologist) or a provider who specializes in disorders of the nervous system (neurologist). You may have additional testing, including:  MRI.  A CT scan.  Eye movement tests. Your  health care provider may ask you to change positions quickly while he or she watches you for symptoms of benign positional vertigo, such as nystagmus. Eye movement may be tested with an electronystagmogram (ENG), caloric stimulation, the Dix-Hallpike test, or the roll test.  An electroencephalogram (EEG). This records electrical activity in your brain.  Hearing tests. TREATMENT Usually, your health care provider will treat this by moving your head in specific positions to adjust your inner ear back to normal. Surgery may be needed in severe cases, but this is rare. In some cases, benign positional vertigo may resolve on its own in 2-4 weeks. HOME CARE INSTRUCTIONS Safety  Move slowly.Avoid sudden body or head movements.  Avoid driving.  Avoid operating heavy machinery.  Avoid doing any tasks that would be dangerous to you or others if a vertigo episode would occur.  If you have trouble walking or keeping your balance, try using a cane for stability. If you feel dizzy or unstable, sit down right away.  Return to your normal activities as told by your health care provider. Ask your health care provider what activities are safe for you. General Instructions  Take over-the-counter and prescription medicines only as told by your health care provider.  Avoid certain positions or movements as told by your health care provider.  Drink enough fluid to keep your urine clear or pale yellow.  Keep all follow-up visits as told by your health care provider. This is important. SEEK MEDICAL CARE IF:  You have a fever.  Your condition gets worse or you develop new symptoms.  Your family or friends   notice any behavioral changes.  Your nausea or vomiting gets worse.  You have numbness or a "pins and needles" sensation. SEEK IMMEDIATE MEDICAL CARE IF:  You have difficulty speaking or moving.  You are always dizzy.  You faint.  You develop severe headaches.  You have weakness in your  legs or arms.  You have changes in your hearing or vision.  You develop a stiff neck.  You develop sensitivity to light.   This information is not intended to replace advice given to you by your health care provider. Make sure you discuss any questions you have with your health care provider.   Document Released: 08/10/2006 Document Revised: 07/24/2015 Document Reviewed: 02/25/2015 Elsevier Interactive Patient Education 2016 [employer]  

## 2016-09-14 ENCOUNTER — Ambulatory Visit: Payer: Self-pay | Admitting: Physician Assistant

## 2016-09-29 ENCOUNTER — Other Ambulatory Visit: Payer: Self-pay | Admitting: Physician Assistant

## 2016-09-29 DIAGNOSIS — Z1231 Encounter for screening mammogram for malignant neoplasm of breast: Secondary | ICD-10-CM

## 2016-10-05 DIAGNOSIS — E782 Mixed hyperlipidemia: Secondary | ICD-10-CM | POA: Diagnosis not present

## 2016-10-05 DIAGNOSIS — I6529 Occlusion and stenosis of unspecified carotid artery: Secondary | ICD-10-CM | POA: Diagnosis not present

## 2016-10-05 DIAGNOSIS — I350 Nonrheumatic aortic (valve) stenosis: Secondary | ICD-10-CM | POA: Diagnosis not present

## 2016-10-19 ENCOUNTER — Other Ambulatory Visit: Payer: Self-pay | Admitting: Family Medicine

## 2016-10-19 DIAGNOSIS — G4700 Insomnia, unspecified: Secondary | ICD-10-CM

## 2016-10-19 DIAGNOSIS — H2512 Age-related nuclear cataract, left eye: Secondary | ICD-10-CM | POA: Diagnosis not present

## 2016-10-20 ENCOUNTER — Other Ambulatory Visit: Payer: Self-pay

## 2016-10-20 ENCOUNTER — Other Ambulatory Visit: Payer: Self-pay | Admitting: Family Medicine

## 2016-10-20 DIAGNOSIS — G4700 Insomnia, unspecified: Secondary | ICD-10-CM

## 2016-10-20 MED ORDER — TEMAZEPAM 15 MG PO CAPS: 15.0000 mg | ORAL_CAPSULE | Freq: Every day | ORAL | 1 refills | Status: DC

## 2016-10-26 DIAGNOSIS — H01003 Unspecified blepharitis right eye, unspecified eyelid: Secondary | ICD-10-CM | POA: Diagnosis not present

## 2016-11-02 ENCOUNTER — Ambulatory Visit (INDEPENDENT_AMBULATORY_CARE_PROVIDER_SITE_OTHER): Payer: Medicare Other | Admitting: Vascular Surgery

## 2016-11-02 ENCOUNTER — Encounter (INDEPENDENT_AMBULATORY_CARE_PROVIDER_SITE_OTHER): Payer: Self-pay | Admitting: Vascular Surgery

## 2016-11-02 ENCOUNTER — Other Ambulatory Visit (INDEPENDENT_AMBULATORY_CARE_PROVIDER_SITE_OTHER): Payer: Self-pay | Admitting: Vascular Surgery

## 2016-11-02 ENCOUNTER — Ambulatory Visit (INDEPENDENT_AMBULATORY_CARE_PROVIDER_SITE_OTHER): Payer: Medicare Other

## 2016-11-02 VITALS — BP 147/68 | HR 60 | Resp 16 | Wt 140.6 lb

## 2016-11-02 DIAGNOSIS — I63412 Cerebral infarction due to embolism of left middle cerebral artery: Secondary | ICD-10-CM

## 2016-11-02 DIAGNOSIS — I6523 Occlusion and stenosis of bilateral carotid arteries: Secondary | ICD-10-CM

## 2016-11-02 DIAGNOSIS — E119 Type 2 diabetes mellitus without complications: Secondary | ICD-10-CM

## 2016-11-02 DIAGNOSIS — I10 Essential (primary) hypertension: Secondary | ICD-10-CM

## 2016-11-02 DIAGNOSIS — E7800 Pure hypercholesterolemia, unspecified: Secondary | ICD-10-CM

## 2016-11-02 NOTE — Progress Notes (Signed)
MRN : [id number]  [name] is a 82 y.o. (29-Jun-1929) female who presents with chief complaint of  Chief Complaint  Patient presents with  . Follow-up  .  History of Present Illness: The patient is seen for follow up evaluation of carotid stenosis. The carotid stenosis followed by ultrasound.   The patient denies amaurosis fugax. There is no recent history of TIA symptoms or focal motor deficits. There is no prior documented CVA.  The patient is taking enteric-coated aspirin 81 mg daily.  There is no history of migraine headaches. There is no history of seizures.  The patient has a history of coronary artery disease, no recent episodes of angina or shortness of breath. The patient denies PAD or claudication symptoms. There is a history of hyperlipidemia which is being treated with a statin.    Carotid Duplex done today shows stable 50-69% RICA and widely patent left CEA.  No change compared to last study in 11/04/2015  Current Meds  Medication Sig  . amLODipine (NORVASC) 10 MG tablet Take 1 tablet (10 mg total) by mouth daily.  Marland Kitchen aspirin 325 MG EC tablet Take 325 mg by mouth daily.   Marland Kitchen atorvastatin (LIPITOR) 40 MG tablet Take 1 tablet (40 mg total) by mouth daily at 6 PM.  . BIOTIN PO Take 1 tablet by mouth daily.   . cholecalciferol (VITAMIN D) 1000 UNITS tablet Take 1,000 Units by mouth daily.   Marland Kitchen erythromycin ophthalmic ointment 1 application at bedtime. Reported on 03/25/2016  . glucose blood (ONE TOUCH ULTRA TEST) test strip Check blood glucose once daily  . hydrochlorothiazide (HYDRODIURIL) 12.5 MG tablet Take 1 tablet (12.5 mg total) by mouth daily.  . OMEGA-3 FATTY ACIDS PO Take 2 capsules by mouth daily.   Marland Kitchen oxybutynin (DITROPAN) 5 MG tablet Take 1 tablet (5 mg total) by mouth 2 (two) times daily.  . polyethylene glycol (MIRALAX / GLYCOLAX) packet Take 17 g by mouth daily.  . temazepam (RESTORIL) 15 MG capsule Take 1 capsule (15 mg total) by mouth at bedtime.     Past Medical History:  Diagnosis Date  . Cancer [facility])    skin    Past Surgical History:  Procedure Laterality Date  . BREAST BIOPSY Right    neg  . BREAST SURGERY     1980's  . CAROTID ARTERY ANGIOPLASTY Left 01/18/2015   Dr. Delana Meyer [facility]  . CHOLECYSTECTOMY  06/27/2012  . DECOMPRESSIVE LUMBAR LAMINECTOMY LEVEL 1  1950  . EYE SURGERY Right 2012   cataract extraction    Social History Social History  Substance Use Topics  . Smoking status: Former Smoker    Packs/day: 0.50    Quit date: 11/15/1976  . Smokeless tobacco: Never Used  . Alcohol use No    Family History Family History  Problem Relation Age of Onset  . Ovarian cancer Mother   . Heart disease Brother   . Heart attack Brother   . Gout Brother   . Alzheimer's disease Brother   . Dementia Brother   . Heart attack Brother   No family history of bleeding/clotting disorders, porphyria or autoimmune disease   Allergies  Allergen Reactions  . Cephalosporins   . Codeine   . Macrolides And Ketolides   . Penicillins   . Tramadol Nausea Only  . Sulfa Antibiotics Rash     REVIEW OF SYSTEMS (Negative unless checked)  Constitutional: [] Weight loss  [] Fever  [] Chills Cardiac: [] Chest pain   [] Chest pressure   []   Palpitations   [] Shortness of breath when laying flat   [] Shortness of breath with exertion. Vascular:  [] Pain in legs with walking   [] Pain in legs at rest  [] History of DVT   [] Phlebitis   [] Swelling in legs   [] Varicose veins   [] Non-healing ulcers Pulmonary:   [] Uses home oxygen   [] Productive cough   [] Hemoptysis   [] Wheeze  [] COPD   [] Asthma Neurologic:  [] Dizziness   [] Seizures   [] History of stroke   [] History of TIA  [] Aphasia   [] Vissual changes   [] Weakness or numbness in arm   [] Weakness or numbness in leg Musculoskeletal:   [] Joint swelling   [] Joint pain   [] Low back pain Hematologic:  [] Easy bruising  [] Easy bleeding   [] Hypercoagulable state   [] Anemic Gastrointestinal:  [] Diarrhea    [] Vomiting  [] Gastroesophageal reflux/heartburn   [] Difficulty swallowing. Genitourinary:  [] Chronic kidney disease   [] Difficult urination  [] Frequent urination   [] Blood in urine Skin:  [] Rashes   [] Ulcers  Psychological:  [] History of anxiety   []  History of major depression.  Physical Examination  Vitals:   11/02/16 1035 11/02/16 1036  BP: (!) 147/79 (!) 147/68  Pulse: 60   Resp: 16   Weight: 140 lb 9.6 oz (63.8 kg)    Body mass index is 26.57 kg/m. Gen: WD/WN, NAD Head: [place]/AT, No temporalis wasting.  Ear/Nose/Throat: Hearing grossly intact, nares w/o erythema or drainage, poor dentition Eyes: PER, EOMI, sclera nonicteric.  Neck: Supple, no masses.  No bruit or JVD.  Pulmonary:  Good air movement, clear to auscultation bilaterally, no use of accessory muscles.  Cardiac: RRR, normal S1, S2, no Murmurs. Vascular: right carotid bruit no left bruit, well healed left CEA scar Vessel Right Left  Radial Palpable Palpable  Ulnar Palpable Palpable  Brachial Palpable Palpable  Carotid Palpable Palpable  Femoral Palpable Palpable  Popliteal Palpable Palpable  PT Palpable Palpable  DP Palpable Palpable   Gastrointestinal: soft, non-distended. No guarding/no peritoneal signs.  Musculoskeletal: M/S 5/5 throughout.  No deformity or atrophy.  Neurologic: CN 2-12 intact. Pain and light touch intact in extremities.  Symmetrical.  Speech is fluent. Motor exam as listed above. Psychiatric: Judgment intact, Mood & affect appropriate for pt's clinical situation. Dermatologic: No rashes or ulcers noted.  No changes consistent with cellulitis. Lymph : No Cervical lymphadenopathy, no lichenification or skin changes of chronic lymphedema.  CBC Lab Results  Component Value Date   WBC 7.6 04/15/2016   HGB 14.6 04/16/2015   HCT 44.0 04/15/2016   MCV 86 04/15/2016   PLT 178 04/15/2016    BMET    Component Value Date/Time   NA 142 04/15/2016 1012   NA 142 12/16/2014 0315   K 4.9  04/15/2016 1012   K 3.3 (L) 12/17/2014 1700   CL 100 04/15/2016 1012   CL 107 12/16/2014 0315   CO2 23 04/15/2016 1012   CO2 28 12/16/2014 0315   GLUCOSE 162 (H) 04/15/2016 1012   GLUCOSE 121 (H) 12/16/2014 0315   BUN 11 04/15/2016 1012   BUN 15 12/16/2014 0315   CREATININE 0.76 04/15/2016 1012   CREATININE 0.94 12/16/2014 0315   CALCIUM 10.2 04/15/2016 1012   CALCIUM 9.1 12/16/2014 0315   GFRNONAA 72 04/15/2016 1012   GFRNONAA >60 12/16/2014 0315   GFRNONAA >60 09/16/2013 1902   GFRAA 83 04/15/2016 1012   GFRAA >60 12/16/2014 0315   GFRAA >60 09/16/2013 1902   CrCl cannot be calculated (Patient's most recent lab result  is older than the maximum 21 days allowed.).  COAG Lab Results  Component Value Date   INR 1.0 12/15/2014    Radiology No results found.   Assessment/Plan 1. Bilateral carotid artery stenosis Recommend:  Given the patient's asymptomatic subcritical stenosis no further invasive testing or surgery at this time.  Duplex ultrasound shows about 50% RICA and widely patent LICA.  Continue antiplatelet therapy as prescribed Continue management of CAD, HTN and Hyperlipidemia Healthy heart diet,  encouraged exercise at least 4 times per week Follow up in 74months with duplex ultrasound and physical exam based on the stable approximate 50% stenosis of the RICA carotid artery   - VAS US CAROTID; Future  2. Cerebrovascular accident (CVA) due to embolism of left middle cerebral artery (HCC) Continue antihypertensive medications as already ordered and reviewed, no changes at this time.  Continue statin as ordered and reviewed, no changes at this time  3. Essential hypertension Continue antihypertensive medications as already ordered and reviewed, no changes at this time.  4. Diabetes mellitus without complication ([place]) Continue hypoglycemic medications as already ordered and reviewed, no changes at this time.  5. Pure hypercholesterolemia Continue statin  as ordered and reviewed, no changes at this time    Hortencia Pilar, MD  11/02/2016 10:48 AM

## 2016-11-10 ENCOUNTER — Ambulatory Visit
Admission: RE | Admit: 2016-11-10 | Discharge: 2016-11-10 | Disposition: A | Discharge status: Home / Self Care | Payer: Medicare Other | Source: Ambulatory Visit | Attending: Physician Assistant | Admitting: Physician Assistant

## 2016-11-10 ENCOUNTER — Telehealth: Payer: Self-pay

## 2016-11-10 DIAGNOSIS — Z1231 Encounter for screening mammogram for malignant neoplasm of breast: Secondary | ICD-10-CM | POA: Diagnosis not present

## 2016-11-10 NOTE — Telephone Encounter (Signed)
-----   Message from Mar Daring, [place] sent at 11/10/2016 11:46 AM EST ----- Normal mammogram. Repeat screening in one year.

## 2016-11-10 NOTE — Telephone Encounter (Signed)
lmtcb Cullen Vanallen Drozdowski, CMA  

## 2016-11-10 NOTE — Telephone Encounter (Signed)
Pt advised. Tayven Renteria Drozdowski, CMA  

## 2016-11-17 DIAGNOSIS — H01003 Unspecified blepharitis right eye, unspecified eyelid: Secondary | ICD-10-CM | POA: Diagnosis not present

## 2016-12-05 ENCOUNTER — Encounter: Payer: Self-pay | Admitting: Emergency Medicine

## 2016-12-05 ENCOUNTER — Emergency Department
Admission: EM | Admit: 2016-12-05 | Discharge: 2016-12-05 | Disposition: A | Discharge status: Home / Self Care | Payer: Medicare Other | Attending: Student in an Organized Health Care Education/Training Program | Admitting: Student in an Organized Health Care Education/Training Program

## 2016-12-05 ENCOUNTER — Emergency Department: Payer: Medicare Other

## 2016-12-05 DIAGNOSIS — Z85828 Personal history of other malignant neoplasm of skin: Secondary | ICD-10-CM | POA: Insufficient documentation

## 2016-12-05 DIAGNOSIS — A09 Infectious gastroenteritis and colitis, unspecified: Secondary | ICD-10-CM | POA: Diagnosis not present

## 2016-12-05 DIAGNOSIS — R197 Diarrhea, unspecified: Secondary | ICD-10-CM | POA: Diagnosis not present

## 2016-12-05 DIAGNOSIS — E119 Type 2 diabetes mellitus without complications: Secondary | ICD-10-CM | POA: Insufficient documentation

## 2016-12-05 DIAGNOSIS — R112 Nausea with vomiting, unspecified: Secondary | ICD-10-CM | POA: Diagnosis not present

## 2016-12-05 DIAGNOSIS — Z7982 Long term (current) use of aspirin: Secondary | ICD-10-CM | POA: Insufficient documentation

## 2016-12-05 DIAGNOSIS — Z79899 Other long term (current) drug therapy: Secondary | ICD-10-CM | POA: Diagnosis not present

## 2016-12-05 DIAGNOSIS — Z87891 Personal history of nicotine dependence: Secondary | ICD-10-CM | POA: Insufficient documentation

## 2016-12-05 LAB — CBC WITH DIFFERENTIAL/PLATELET
BASOS PCT: 0 %
Basophils Absolute: 0 10*3/uL (ref 0–0.1)
Eosinophils Absolute: 0.3 10*3/uL (ref 0–0.7)
Eosinophils Relative: 3 %
HCT: 46.1 % (ref 35.0–47.0)
HEMOGLOBIN: 16.2 g/dL — AB (ref 12.0–16.0)
LYMPHS ABS: 1.5 10*3/uL (ref 1.0–3.6)
Lymphocytes Relative: 17 %
MCH: 30.5 pg (ref 26.0–34.0)
MCHC: 35.1 g/dL (ref 32.0–36.0)
MCV: 86.9 fL (ref 80.0–100.0)
Monocytes Absolute: 0.8 10*3/uL (ref 0.2–0.9)
Monocytes Relative: 8 %
NEUTROS PCT: 72 %
Neutro Abs: 6.6 10*3/uL — ABNORMAL HIGH (ref 1.4–6.5)
Platelets: 162 10*3/uL (ref 150–440)
RBC: 5.31 MIL/uL — AB (ref 3.80–5.20)
RDW: 13.4 % (ref 11.5–14.5)
WBC: 9.1 10*3/uL (ref 3.6–11.0)

## 2016-12-05 LAB — COMPREHENSIVE METABOLIC PANEL
ALBUMIN: 4.4 g/dL (ref 3.5–5.0)
ALK PHOS: 58 U/L (ref 38–126)
ALT: 179 U/L — AB (ref 14–54)
AST: 183 U/L — AB (ref 15–41)
Anion gap: 13 (ref 5–15)
BUN: 16 mg/dL (ref 6–20)
CALCIUM: 9.3 mg/dL (ref 8.9–10.3)
CO2: 18 mmol/L — AB (ref 22–32)
CREATININE: 0.79 mg/dL (ref 0.44–1.00)
Chloride: 104 mmol/L (ref 101–111)
GFR calc Af Amer: >60 mL/min (ref >60)
GFR calc non Af Amer: >60 mL/min (ref >60)
GLUCOSE: 159 mg/dL — AB (ref 65–99)
Potassium: 3.5 mmol/L (ref 3.5–5.1)
SODIUM: 135 mmol/L (ref 135–145)
Total Bilirubin: 1.6 mg/dL — ABNORMAL HIGH (ref 0.3–1.2)
Total Protein: 7.8 g/dL (ref 6.5–8.1)

## 2016-12-05 LAB — BASIC METABOLIC PANEL
Anion gap: 8 (ref 5–15)
BUN: 13 mg/dL (ref 6–20)
CO2: 22 mmol/L (ref 22–32)
CREATININE: 0.66 mg/dL (ref 0.44–1.00)
Calcium: 8.9 mg/dL (ref 8.9–10.3)
Chloride: 104 mmol/L (ref 101–111)
GFR calc Af Amer: >60 mL/min (ref >60)
GFR calc non Af Amer: >60 mL/min (ref >60)
Glucose, Bld: 130 mg/dL — ABNORMAL HIGH (ref 65–99)
Potassium: 3 mmol/L — ABNORMAL LOW (ref 3.5–5.1)
Sodium: 134 mmol/L — ABNORMAL LOW (ref 135–145)

## 2016-12-05 LAB — LACTIC ACID, PLASMA: Lactic Acid, Venous: 1.7 mmol/L (ref 0.5–1.9)

## 2016-12-05 LAB — INFLUENZA PANEL BY PCR (TYPE A & B)
INFLBPCR: NEGATIVE
Influenza A By PCR: NEGATIVE

## 2016-12-05 MED ORDER — SODIUM CHLORIDE 0.9 % IV SOLN: Freq: Once | INTRAVENOUS | Status: DC

## 2016-12-05 MED ORDER — SODIUM CHLORIDE 0.9 % IV BOLUS (SEPSIS)
500.0000 mL | Freq: Once | INTRAVENOUS | Status: AC
  Administered 2016-12-05: 500 mL via INTRAVENOUS

## 2016-12-05 MED ORDER — ONDANSETRON HCL 4 MG PO TABS: 4.0000 mg | ORAL_TABLET | Freq: Every day | ORAL | 0 refills | Status: DC | PRN

## 2016-12-05 NOTE — ED Triage Notes (Addendum)
Reports diarrhea x 3 days.  Reports vomiting earlier this week.  Skin w/d with good color.  Mask applied.

## 2016-12-05 NOTE — ED Notes (Signed)
NAD noted at time of D/C. Pt taken to the lobby via wheelchair by Romilda Joy, EDT-P. Pt denies any comments/concerns at this time.

## 2016-12-05 NOTE — ED Notes (Signed)
Pt given something to eat and drink by Dr. Quentin Cornwall. Pt noted to be sitting up in bed at this time. Respirations even and unlabored. NAD noted.

## 2016-12-05 NOTE — Discharge Instructions (Signed)
Please drink plenty of fluids.  REturn for worsening symptoms, dehydration, intractable nausea or vomiting.

## 2016-12-05 NOTE — ED Notes (Signed)
This RN to bedside at this time to introduce self to patient. Pt is alert and oriented at this time, pt's family at bedside. This RN apologized for and explained delay. Pt states understanding at this time.

## 2016-12-05 NOTE — ED Provider Notes (Signed)
[facility] Emergency Department Provider Note    None    (approximate)  I have reviewed the triage vital signs and the nursing notes.   HISTORY  Chief Complaint Diarrhea    HPI [name] is a 83 y.o. female presents with a chief complaint of several days of watery diarrhea. Symptoms started roughly 3-4 days ago initially with vomiting. States that she thinks that she contracted the neurovirus from her friend.  Symptoms have improved over the past 72 hours however the patient transitioned from more vomiting to watery diarrhea. States that she does feel weak but no measured fevers. No shortness of breath or chest pain. Denies any blood in her vomit had no blood in her stools. No melena. Denies any abdominal pain.   Past Medical History:  Diagnosis Date  . Cancer ([place])    skin   Family History  Problem Relation Age of Onset  . Ovarian cancer Mother   . Heart disease Brother   . Heart attack Brother   . Gout Brother   . Alzheimer's disease Brother   . Dementia Brother   . Heart attack Brother    Past Surgical History:  Procedure Laterality Date  . BREAST BIOPSY Right    neg  . BREAST SURGERY     1980's  . CAROTID ARTERY ANGIOPLASTY Left 01/18/2015   Dr. Delana Meyer [facility]  . CHOLECYSTECTOMY  06/27/2012  . DECOMPRESSIVE LUMBAR LAMINECTOMY LEVEL 1  1950  . EYE SURGERY Right 2012   cataract extraction   Patient Active Problem List   Diagnosis Date Noted  . Diabetes mellitus without complication ([place]) [id number]  . Allergic rhinitis 08/05/2015  . Aortic heart valve narrowing 08/05/2015  . Breast cyst 08/05/2015  . Carotid artery narrowing 08/05/2015  . Colon polyp 08/05/2015  . Cerebral vascular accident ([place]) 08/05/2015  . DD (diverticular disease) 08/05/2015  . Elevated CK 08/05/2015  . Acid reflux 08/05/2015  . Headache disorder 08/05/2015  . Calcium blood increased 08/05/2015  . HLD (hyperlipidemia) 08/05/2015  . BP (high blood  pressure) 08/05/2015  . Cannot sleep 08/05/2015  . LBP (low back pain) 08/05/2015  . OP (osteoporosis) 08/05/2015  . Adiposity 08/05/2015  . Abnormal Pap smear of vagina 08/05/2015  . Hemorrhage, postmenopausal 08/05/2015  . Superficial thrombophlebitis 08/05/2015  . FOM (frequency of micturition) 08/05/2015  . Fatigue 08/05/2015      Prior to Admission medications   Medication Sig Start Date End Date Taking? Authorizing Provider  amLODipine (NORVASC) 10 MG tablet Take 1 tablet (10 mg total) by mouth daily. 04/15/16   Margarita Rana, MD  aspirin 325 MG EC tablet Take 325 mg by mouth daily.  01/14/15   Historical Provider, MD  atorvastatin (LIPITOR) 40 MG tablet Take 1 tablet (40 mg total) by mouth daily at 6 PM. 04/15/16   Margarita Rana, MD  BIOTIN PO Take 1 tablet by mouth daily.     Historical Provider, MD  cholecalciferol (VITAMIN D) 1000 UNITS tablet Take 1,000 Units by mouth daily.     Historical Provider, MD  erythromycin ophthalmic ointment 1 application at bedtime. Reported on 03/25/2016    Historical Provider, MD  glucose blood (ONE TOUCH ULTRA TEST) test strip Check blood glucose once daily 08/17/16   Mar Daring, PA-C  hydrochlorothiazide (HYDRODIURIL) 12.5 MG tablet Take 1 tablet (12.5 mg total) by mouth daily. 04/15/16   Margarita Rana, MD  OMEGA-3 FATTY ACIDS PO Take 2 capsules by mouth daily.  Historical Provider, MD  oxybutynin (DITROPAN) 5 MG tablet Take 1 tablet (5 mg total) by mouth 2 (two) times daily. 04/15/16   Margarita Rana, MD  polyethylene glycol [facility] Floria Raveling) packet Take 17 g by mouth daily.    Historical Provider, MD  temazepam (RESTORIL) 15 MG capsule Take 1 capsule (15 mg total) by mouth at bedtime. 10/20/16   Birdie Sons, MD    Allergies Cephalosporins; Codeine; Macrolides and ketolides; Penicillins; Tramadol; and Sulfa antibiotics    Social History Social History  Substance Use Topics  . Smoking status: Former Smoker    Packs/day: 0.50     Quit date: 11/15/1976  . Smokeless tobacco: Never Used  . Alcohol use No    Review of Systems Patient denies headaches, rhinorrhea, blurry vision, numbness, shortness of breath, chest pain, edema, cough, abdominal pain, nausea, vomiting, diarrhea, dysuria, fevers, rashes or hallucinations unless otherwise stated above in HPI. ____________________________________________   PHYSICAL EXAM:  VITAL SIGNS: Vitals:   12/05/16 1114  BP: 137/81  Pulse: 94  Resp: 16  Temp: 98.3 F (36.8 C)    Constitutional: Alert and oriented. n no acute distress. Eyes: Conjunctivae are normal. PERRL. EOMI. Head: Atraumatic. Nose: No congestion/rhinnorhea. Mouth/Throat: Mucous membranes are moist.  Oropharynx non-erythematous. Neck: No stridor. Painless ROM. No cervical spine tenderness to palpation Hematological/Lymphatic/Immunilogical: No cervical lymphadenopathy. Cardiovascular: Normal rate, regular rhythm. Grossly normal heart sounds.  Good peripheral circulation. Respiratory: Normal respiratory effort.  No retractions. Lungs CTAB. Gastrointestinal: Soft and nontender. No distention. No abdominal bruits. No CVA tenderness. Musculoskeletal: No lower extremity tenderness nor edema.  No joint effusions. Neurologic:  Normal speech and language. No gross focal neurologic deficits are appreciated. No gait instability. Skin:  Skin is warm, dry and intact. No rash noted. Psychiatric: Mood and affect are normal. Speech and behavior are normal.  ____________________________________________   LABS (all labs ordered are listed, but only abnormal results are displayed)  Results for orders placed or performed during the hospital encounter of 12/05/16 (from the past 24 hour(s))  CBC with Differential     Status: Abnormal   Collection Time: 12/05/16 11:17 AM  Result Value Ref Range   WBC 9.1 3.6 - 11.0 K/uL   RBC 5.31 (H) 3.80 - 5.20 MIL/uL   Hemoglobin 16.2 (H) 12.0 - 16.0 g/dL   HCT 46.1 35.0 - 47.0  %   MCV 86.9 80.0 - 100.0 fL   MCH 30.5 26.0 - 34.0 pg   MCHC 35.1 32.0 - 36.0 g/dL   RDW 13.4 11.5 - 14.5 %   Platelets 162 150 - 440 K/uL   Neutrophils Relative % 72 %   Neutro Abs 6.6 (H) 1.4 - 6.5 K/uL   Lymphocytes Relative 17 %   Lymphs Abs 1.5 1.0 - 3.6 K/uL   Monocytes Relative 8 %   Monocytes Absolute 0.8 0.2 - 0.9 K/uL   Eosinophils Relative 3 %   Eosinophils Absolute 0.3 0 - 0.7 K/uL   Basophils Relative 0 %   Basophils Absolute 0.0 0 - 0.1 K/uL  Comprehensive metabolic panel     Status: Abnormal   Collection Time: 12/05/16 11:17 AM  Result Value Ref Range   Sodium 135 135 - 145 mmol/L   Potassium 3.5 3.5 - 5.1 mmol/L   Chloride 104 101 - 111 mmol/L   CO2 18 (L) 22 - 32 mmol/L   Glucose, Bld 159 (H) 65 - 99 mg/dL   BUN 16 6 - 20 mg/dL   Creatinine, Ser  0.79 0.44 - 1.00 mg/dL   Calcium 9.3 8.9 - 10.3 mg/dL   Total Protein 7.8 6.5 - 8.1 g/dL   Albumin 4.4 3.5 - 5.0 g/dL   AST 183 (H) 15 - 41 U/L   ALT 179 (H) 14 - 54 U/L   Alkaline Phosphatase 58 38 - 126 U/L   Total Bilirubin 1.6 (H) 0.3 - 1.2 mg/dL   GFR calc non Af Amer >60 >60 mL/min   GFR calc Af Amer >60 >60 mL/min   Anion gap 13 5 - 15   ____________________________________________  EKG____________________________________________  RADIOLOGY  I personally reviewed all radiographic images ordered to evaluate for the above acute complaints and reviewed radiology reports and findings.  These findings were personally discussed with the patient.  Please see medical record for radiology report.  ____________________________________________   PROCEDURES  Procedure(s) performed:  Procedures    Critical Care performed: no ____________________________________________   INITIAL IMPRESSION / ASSESSMENT AND PLAN / ED COURSE  Pertinent labs & imaging results that were available during my care of the patient were reviewed by me and considered in my medical decision making (see chart for details).  DDX:  enteritis, flu, c-dif, norovirus  HIYAB MALAVE is a 83 y.o. who presents to the ED with weakness following several days of nausea vomiting and diarrhea.  Patient is AFVSS in ED. Exam as above. Given current presentation have considered the above differential.  We'll check blood work to evaluate for any underlying or slight abnormality. We'll provide IV fluids for dehydration. Her abdominal exam is soft and benign and patient denies any pain at this time. We'll order chest x-ray to evaluate for any evidence of pneumonia or abnormal gas patterns have a low suspicion for any anatomic abnormality at this point. Based on her history with recent sick contacts is most likely the patient has some component of viral illness. She has no risk factors or recent antibiotics to suggest C. difficile infection.  The patient will be placed on continuous pulse oximetry and telemetry for monitoring.  Laboratory evaluation will be sent to evaluate for the above complaints.     Clinical Course as of Dec 06 752  Sat Dec 05, 2016  1741 Blood work is reassuring. She requesting discharge home. I have encouraged trial of oral hydration prior to discharge home. Fluids negative. She's not had any significant diarrhea since here in the ER. Her lactate is normal.  Will reassess.  [PR]    Clinical Course User Index [PR] Merlyn Lot, MD    Patient with improvement symptoms. Was provided IV fluids and then transition to oral hydration. I recommended admission to hospital for further hydration the patient is a symptomatically at this time and is requesting discharge back to home. Due to this is a reasonable option at this time as her blood work is reassuring.  Patient was able to tolerate PO and was able to ambulate with a steady gait.   Have discussed with the patient and available family all diagnostics and treatments performed thus far and all questions were answered to the best of my ability. The patient demonstrates  understanding and agreement with plan.  ____________________________________________   FINAL CLINICAL IMPRESSION(S) / ED DIAGNOSES  Final diagnoses:  Diarrhea of presumed infectious origin      NEW MEDICATIONS STARTED DURING THIS VISIT:  New Prescriptions   No medications on file     Note:  This document was prepared using Dragon voice recognition software and may include unintentional  dictation errors.    Merlyn Lot, MD 12/06/16 0800

## 2016-12-09 ENCOUNTER — Telehealth: Payer: Self-pay | Admitting: Physician Assistant

## 2016-12-09 NOTE — Telephone Encounter (Signed)
Pt was discharged from [facility]  ER on 12/05/16 for nausea and diarrhea.  I have schedule a hospital follow up appoburintment/MW

## 2016-12-09 NOTE — Telephone Encounter (Signed)
Patient went to the ED she is scheduled to see you 12/16/16.

## 2016-12-09 NOTE — Telephone Encounter (Signed)
Can we do transition of care call please

## 2016-12-16 ENCOUNTER — Ambulatory Visit (INDEPENDENT_AMBULATORY_CARE_PROVIDER_SITE_OTHER): Payer: Medicare Other | Admitting: Physician Assistant

## 2016-12-16 ENCOUNTER — Encounter: Payer: Self-pay | Admitting: Physician Assistant

## 2016-12-16 VITALS — BP 130/70 | HR 63 | Temp 97.5°F | Resp 16 | Wt 140.2 lb

## 2016-12-16 DIAGNOSIS — K529 Noninfective gastroenteritis and colitis, unspecified: Secondary | ICD-10-CM

## 2016-12-16 NOTE — Progress Notes (Addendum)
Patient: [name] Female    DOB: 05-01-1929   83 y.o.   MRN: [id number] Visit Date: 12/16/2016  Today's Provider: Mar Daring, PA-C   Chief Complaint  Patient presents with  . ER Follow-up   Subjective:    HPI  Follow Up ER Visit  Patient is here for ER follow up.  She was recently seen at [facility] for diarrhea on 12/05/2016. Treatment for this included IV fluids, Zofran prn for nausea. She reports good compliance with treatment. She reports this condition is Improved. ------------------------------------------------------------------------------------     Allergies  Allergen Reactions  . Cephalosporins   . Codeine   . Macrolides And Ketolides   . Penicillins   . Tramadol Nausea Only  . Sulfa Antibiotics Rash     Current Outpatient Prescriptions:  .  amLODipine (NORVASC) 10 MG tablet, Take 1 tablet (10 mg total) by mouth daily., Disp: 90 tablet, Rfl: 3 .  aspirin 325 MG EC tablet, Take 325 mg by mouth daily. , Disp: , Rfl:  .  atorvastatin (LIPITOR) 40 MG tablet, Take 1 tablet (40 mg total) by mouth daily at 6 PM., Disp: 90 tablet, Rfl: 3 .  BIOTIN PO, Take 1 tablet by mouth daily. , Disp: , Rfl:  .  cholecalciferol (VITAMIN D) 1000 UNITS tablet, Take 1,000 Units by mouth daily. , Disp: , Rfl:  .  erythromycin ophthalmic ointment, 1 application at bedtime. Reported on 03/25/2016, Disp: , Rfl:  .  glucose blood (ONE TOUCH ULTRA TEST) test strip, Check blood glucose once daily, Disp: 100 each, Rfl: 12 .  hydrochlorothiazide (HYDRODIURIL) 12.5 MG tablet, Take 1 tablet (12.5 mg total) by mouth daily., Disp: 90 tablet, Rfl: 3 .  OMEGA-3 FATTY ACIDS PO, Take 2 capsules by mouth daily. , Disp: , Rfl:  .  oxybutynin (DITROPAN) 5 MG tablet, Take 1 tablet (5 mg total) by mouth 2 (two) times daily., Disp: 180 tablet, Rfl: 3 .  polyethylene glycol (MIRALAX / GLYCOLAX) packet, Take 17 g by mouth daily., Disp: , Rfl:  .  temazepam (RESTORIL) 15 MG capsule, Take 1  capsule (15 mg total) by mouth at bedtime., Disp: 90 capsule, Rfl: 1 .  ondansetron (ZOFRAN) 4 MG tablet, Take 1 tablet (4 mg total) by mouth daily as needed for nausea or vomiting., Disp: 14 tablet, Rfl: 0  Review of Systems  Constitutional: Negative for fatigue and fever.  Respiratory: Negative for cough, chest tightness and shortness of breath.   Cardiovascular: Negative for chest pain, palpitations and leg swelling.  Gastrointestinal: Negative for abdominal pain, blood in stool, constipation, diarrhea, nausea and vomiting.  Neurological: Negative for dizziness, weakness, numbness and headaches.    Social History  Substance Use Topics  . Smoking status: Former Smoker    Packs/day: 0.50    Quit date: 11/15/1976  . Smokeless tobacco: Never Used  . Alcohol use No   Objective:   BP 130/70 (BP Location: Left Arm, Patient Position: Sitting, Cuff Size: Normal)   Pulse 63   Temp 97.5 F (36.4 C) (Oral)   Resp 16   Wt 140 lb 3.2 oz (63.6 kg)   BMI 25.64 kg/m   Physical Exam  Constitutional: She appears well-developed and well-nourished. No distress.  Neck: Normal range of motion. Neck supple. No JVD present. No tracheal deviation present. No thyromegaly present.  Cardiovascular: Normal rate and regular rhythm.  Exam reveals no gallop and no friction rub.   Murmur heard. Pulmonary/Chest: Effort normal  and breath sounds normal. No respiratory distress. She has no wheezes. She has no rales.  Lymphadenopathy:    She has no cervical adenopathy.  Skin: She is not diaphoretic.  Vitals reviewed.     Assessment & Plan:     1. Gastroenteritis Much improved. Patient has been having normal bowel movements for the last 2 days. Continue to push fluids. She is to call the office if she develops any acute issues in the meantime. If not I will see her back in June for her annual wellness visit.       Mar Daring, PA-C  [facility]

## 2017-02-01 DIAGNOSIS — L570 Actinic keratosis: Secondary | ICD-10-CM | POA: Diagnosis not present

## 2017-02-01 DIAGNOSIS — X32XXXA Exposure to sunlight, initial encounter: Secondary | ICD-10-CM | POA: Diagnosis not present

## 2017-03-22 DIAGNOSIS — D485 Neoplasm of uncertain behavior of skin: Secondary | ICD-10-CM | POA: Diagnosis not present

## 2017-03-22 DIAGNOSIS — C44329 Squamous cell carcinoma of skin of other parts of face: Secondary | ICD-10-CM | POA: Diagnosis not present

## 2017-04-19 DIAGNOSIS — I6529 Occlusion and stenosis of unspecified carotid artery: Secondary | ICD-10-CM | POA: Diagnosis not present

## 2017-04-19 DIAGNOSIS — I350 Nonrheumatic aortic (valve) stenosis: Secondary | ICD-10-CM | POA: Diagnosis not present

## 2017-04-19 DIAGNOSIS — I10 Essential (primary) hypertension: Secondary | ICD-10-CM | POA: Diagnosis not present

## 2017-04-19 DIAGNOSIS — E782 Mixed hyperlipidemia: Secondary | ICD-10-CM | POA: Diagnosis not present

## 2017-04-20 ENCOUNTER — Ambulatory Visit (INDEPENDENT_AMBULATORY_CARE_PROVIDER_SITE_OTHER): Payer: Medicare Other | Admitting: Physician Assistant

## 2017-04-20 ENCOUNTER — Ambulatory Visit (INDEPENDENT_AMBULATORY_CARE_PROVIDER_SITE_OTHER): Payer: Medicare Other

## 2017-04-20 ENCOUNTER — Ambulatory Visit: Payer: Self-pay

## 2017-04-20 ENCOUNTER — Other Ambulatory Visit: Payer: Self-pay | Admitting: Family Medicine

## 2017-04-20 VITALS — BP 146/68 | HR 72 | Temp 98.7°F | Ht 62.0 in | Wt 142.8 lb

## 2017-04-20 DIAGNOSIS — M810 Age-related osteoporosis without current pathological fracture: Secondary | ICD-10-CM | POA: Diagnosis not present

## 2017-04-20 DIAGNOSIS — E7800 Pure hypercholesterolemia, unspecified: Secondary | ICD-10-CM

## 2017-04-20 DIAGNOSIS — Z0000 Encounter for general adult medical examination without abnormal findings: Secondary | ICD-10-CM

## 2017-04-20 DIAGNOSIS — E119 Type 2 diabetes mellitus without complications: Secondary | ICD-10-CM | POA: Diagnosis not present

## 2017-04-20 DIAGNOSIS — G4700 Insomnia, unspecified: Secondary | ICD-10-CM | POA: Diagnosis not present

## 2017-04-20 DIAGNOSIS — R350 Frequency of micturition: Secondary | ICD-10-CM | POA: Diagnosis not present

## 2017-04-20 DIAGNOSIS — K580 Irritable bowel syndrome with diarrhea: Secondary | ICD-10-CM | POA: Diagnosis not present

## 2017-04-20 DIAGNOSIS — I6523 Occlusion and stenosis of bilateral carotid arteries: Secondary | ICD-10-CM | POA: Diagnosis not present

## 2017-04-20 DIAGNOSIS — I10 Essential (primary) hypertension: Secondary | ICD-10-CM

## 2017-04-20 HISTORY — DX: Irritable bowel syndrome with diarrhea: K58.0

## 2017-04-20 LAB — POCT UA - MICROALBUMIN: MICROALBUMIN (UR) POC: 100 mg/L

## 2017-04-20 MED ORDER — OXYBUTYNIN CHLORIDE 5 MG PO TABS: 5.0000 mg | ORAL_TABLET | Freq: Two times a day (BID) | ORAL | 3 refills | Status: DC

## 2017-04-20 MED ORDER — ATORVASTATIN CALCIUM 40 MG PO TABS: 40.0000 mg | ORAL_TABLET | Freq: Every day | ORAL | 3 refills | Status: DC

## 2017-04-20 MED ORDER — ALOSETRON HCL 0.5 MG PO TABS: 0.5000 mg | ORAL_TABLET | Freq: Two times a day (BID) | ORAL | 0 refills | Status: DC

## 2017-04-20 MED ORDER — HYDROCHLOROTHIAZIDE 12.5 MG PO TABS: 12.5000 mg | ORAL_TABLET | Freq: Every day | ORAL | 3 refills | Status: DC

## 2017-04-20 MED ORDER — TEMAZEPAM 15 MG PO CAPS: 15.0000 mg | ORAL_CAPSULE | Freq: Every day | ORAL | 1 refills | Status: DC

## 2017-04-20 MED ORDER — AMLODIPINE BESYLATE 10 MG PO TABS: 10.0000 mg | ORAL_TABLET | Freq: Every day | ORAL | 3 refills | Status: DC

## 2017-04-20 NOTE — Patient Instructions (Signed)
Health Maintenance for Postmenopausal Women Menopause is a normal process in which your reproductive ability comes to an end. This process happens gradually over a span of months to years, usually between the ages of 67 and 51. Menopause is complete when you have missed 12 consecutive menstrual periods. It is important to talk with your health care provider about some of the most common conditions that affect postmenopausal women, such as heart disease, cancer, and bone loss (osteoporosis). Adopting a healthy lifestyle and getting preventive care can help to promote your health and wellness. Those actions can also lower your chances of developing some of these common conditions. What should I know about menopause? During menopause, you may experience a number of symptoms, such as:  Moderate-to-severe hot flashes.  Night sweats.  Decrease in sex drive.  Mood swings.  Headaches.  Tiredness.  Irritability.  Memory problems.  Insomnia.  Choosing to treat or not to treat menopausal changes is an individual decision that you make with your health care provider. What should I know about hormone replacement therapy and supplements? Hormone therapy products are effective for treating symptoms that are associated with menopause, such as hot flashes and night sweats. Hormone replacement carries certain risks, especially as you become older. If you are thinking about using estrogen or estrogen with progestin treatments, discuss the benefits and risks with your health care provider. What should I know about heart disease and stroke? Heart disease, heart attack, and stroke become more likely as you age. This may be due, in part, to the hormonal changes that your body experiences during menopause. These can affect how your body processes dietary fats, triglycerides, and cholesterol. Heart attack and stroke are both medical emergencies. There are many things that you can do to help prevent heart disease  and stroke:  Have your blood pressure checked at least every 1-2 years. High blood pressure causes heart disease and increases the risk of stroke.  If you are 4-87 years old, ask your health care provider if you should take aspirin to prevent a heart attack or a stroke.  Do not use any tobacco products, including cigarettes, chewing tobacco, or electronic cigarettes. If you need help quitting, ask your health care provider.  It is important to eat a healthy diet and maintain a healthy weight. ? Be sure to include plenty of vegetables, fruits, low-fat dairy products, and lean protein. ? Avoid eating foods that are high in solid fats, added sugars, or salt (sodium).  Get regular exercise. This is one of the most important things that you can do for your health. ? Try to exercise for at least 150 minutes each week. The type of exercise that you do should increase your heart rate and make you sweat. This is known as moderate-intensity exercise. ? Try to do strengthening exercises at least twice each week. Do these in addition to the moderate-intensity exercise.  Know your numbers.Ask your health care provider to check your cholesterol and your blood glucose. Continue to have your blood tested as directed by your health care provider.  What should I know about cancer screening? There are several types of cancer. Take the following steps to reduce your risk and to catch any cancer development as early as possible. Breast Cancer  Practice breast self-awareness. ? This means understanding how your breasts normally appear and feel. ? It also means doing regular breast self-exams. Let your health care provider know about any changes, no matter how small.  If you are 40  or older, have a clinician do a breast exam (clinical breast exam or CBE) every year. Depending on your age, family history, and medical history, it may be recommended that you also have a yearly breast X-ray (mammogram).  If you  have a family history of breast cancer, talk with your health care provider about genetic screening.  If you are at high risk for breast cancer, talk with your health care provider about having an MRI and a mammogram every year.  Breast cancer (BRCA) gene test is recommended for women who have family members with BRCA-related cancers. Results of the assessment will determine the need for genetic counseling and BRCA1 and for BRCA2 testing. BRCA-related cancers include these types: ? Breast. This occurs in males or females. ? Ovarian. ? Tubal. This may also be called fallopian tube cancer. ? Cancer of the abdominal or pelvic lining (peritoneal cancer). ? Prostate. ? Pancreatic.  Cervical, Uterine, and Ovarian Cancer Your health care provider may recommend that you be screened regularly for cancer of the pelvic organs. These include your ovaries, uterus, and vagina. This screening involves a pelvic exam, which includes checking for microscopic changes to the surface of your cervix (Pap test).  For women ages 21-65, health care providers may recommend a pelvic exam and a Pap test every three years. For women ages 79-65, they may recommend the Pap test and pelvic exam, combined with testing for human papilloma virus (HPV), every five years. Some types of HPV increase your risk of cervical cancer. Testing for HPV may also be done on women of any age who have unclear Pap test results.  Other health care providers may not recommend any screening for nonpregnant women who are considered low risk for pelvic cancer and have no symptoms. Ask your health care provider if a screening pelvic exam is right for you.  If you have had past treatment for cervical cancer or a condition that could lead to cancer, you need Pap tests and screening for cancer for at least 20 years after your treatment. If Pap tests have been discontinued for you, your risk factors (such as having a new sexual partner) need to be  reassessed to determine if you should start having screenings again. Some women have medical problems that increase the chance of getting cervical cancer. In these cases, your health care provider may recommend that you have screening and Pap tests more often.  If you have a family history of uterine cancer or ovarian cancer, talk with your health care provider about genetic screening.  If you have vaginal bleeding after reaching menopause, tell your health care provider.  There are currently no reliable tests available to screen for ovarian cancer.  Lung Cancer Lung cancer screening is recommended for adults 69-62 years old who are at high risk for lung cancer because of a history of smoking. A yearly low-dose CT scan of the lungs is recommended if you:  Currently smoke.  Have a history of at least 30 pack-years of smoking and you currently smoke or have quit within the past 15 years. A pack-year is smoking an average of one pack of cigarettes per day for one year.  Yearly screening should:  Continue until it has been 15 years since you quit.  Stop if you develop a health problem that would prevent you from having lung cancer treatment.  Colorectal Cancer  This type of cancer can be detected and can often be prevented.  Routine colorectal cancer screening usually begins at  age 42 and continues through age 45.  If you have risk factors for colon cancer, your health care provider may recommend that you be screened at an earlier age.  If you have a family history of colorectal cancer, talk with your health care provider about genetic screening.  Your health care provider may also recommend using home test kits to check for hidden blood in your stool.  A small camera at the end of a tube can be used to examine your colon directly (sigmoidoscopy or colonoscopy). This is done to check for the earliest forms of colorectal cancer.  Direct examination of the colon should be repeated every  5-10 years until age 71. However, if early forms of precancerous polyps or small growths are found or if you have a family history or genetic risk for colorectal cancer, you may need to be screened more often.  Skin Cancer  Check your skin from head to toe regularly.  Monitor any moles. Be sure to tell your health care provider: ? About any new moles or changes in moles, especially if there is a change in a mole's shape or color. ? If you have a mole that is larger than the size of a pencil eraser.  If any of your family members has a history of skin cancer, especially at a young age, talk with your health care provider about genetic screening.  Always use sunscreen. Apply sunscreen liberally and repeatedly throughout the day.  Whenever you are outside, protect yourself by wearing long sleeves, pants, a wide-brimmed hat, and sunglasses.  What should I know about osteoporosis? Osteoporosis is a condition in which bone destruction happens more quickly than new bone creation. After menopause, you may be at an increased risk for osteoporosis. To help prevent osteoporosis or the bone fractures that can happen because of osteoporosis, the following is recommended:  If you are 46-71 years old, get at least 1,000 mg of calcium and at least 600 mg of vitamin D per day.  If you are older than age 55 but younger than age 65, get at least 1,200 mg of calcium and at least 600 mg of vitamin D per day.  If you are older than age 54, get at least 1,200 mg of calcium and at least 800 mg of vitamin D per day.  Smoking and excessive alcohol intake increase the risk of osteoporosis. Eat foods that are rich in calcium and vitamin D, and do weight-bearing exercises several times each week as directed by your health care provider. What should I know about how menopause affects my mental health? Depression may occur at any age, but it is more common as you become older. Common symptoms of depression  include:  Low or sad mood.  Changes in sleep patterns.  Changes in appetite or eating patterns.  Feeling an overall lack of motivation or enjoyment of activities that you previously enjoyed.  Frequent crying spells.  Talk with your health care provider if you think that you are experiencing depression. What should I know about immunizations? It is important that you get and maintain your immunizations. These include:  Tetanus, diphtheria, and pertussis (Tdap) booster vaccine.  Influenza every year before the flu season begins.  Pneumonia vaccine.  Shingles vaccine.  Your health care provider may also recommend other immunizations. This information is not intended to replace advice given to you by your health care provider. Make sure you discuss any questions you have with your health care provider. Document Released: 12/25/2005  Document Revised: 05/22/2016 Document Reviewed: 08/06/2015 Elsevier Interactive Patient Education  [employer].

## 2017-04-20 NOTE — Progress Notes (Signed)
Subjective:   [name] is a 83 y.o. female who presents for Medicare Annual (Subsequent) preventive examination.  Review of Systems:  N/A  Cardiac Risk Factors include: advanced age (>29men, >75 women);diabetes mellitus;dyslipidemia;hypertension     Objective:     Vitals: BP (!) 146/68 (BP Location: Right Arm)   Pulse 72   Temp 98.7 F (37.1 C) (Oral)   Ht 5\' 2"  (1.575 m)   Wt 142 lb 12.8 oz (64.8 kg)   BMI 26.12 kg/m   Body mass index is 26.12 kg/m.   Tobacco History  Smoking Status  . Former Smoker  . Packs/day: 0.50  . Quit date: 11/15/1976  Smokeless Tobacco  . Never Used     Counseling given: Not Answered   Past Medical History:  Diagnosis Date  . Cancer ([place])    skin  . Diabetes mellitus without complication ([place])    type 2  . Hyperlipidemia   . Hypertension   . IBS (irritable bowel syndrome)    Past Surgical History:  Procedure Laterality Date  . BREAST BIOPSY Right    neg  . BREAST SURGERY     1980's  . CAROTID ARTERY ANGIOPLASTY Left 01/18/2015   Dr. Delana [facility]  . CHOLECYSTECTOMY  06/27/2012  . DECOMPRESSIVE LUMBAR LAMINECTOMY LEVEL 1  1950  . EYE SURGERY Right 2012   cataract extraction   Family History  Problem Relation Age of Onset  . Ovarian cancer Mother   . Heart disease Brother   . Heart attack Brother   . Gout Brother   . Alzheimer's disease Brother   . Dementia Brother   . Heart attack Brother   . Diabetes Daughter    History  Sexual Activity  . Sexual activity: Not on file    Outpatient Encounter Prescriptions as of 04/20/2017  Medication Sig  . amLODipine (NORVASC) 10 MG tablet Take 1 tablet (10 mg total) by mouth daily.  Marland Kitchen aspirin 325 MG EC tablet Take 325 mg by mouth daily.   Marland Kitchen atorvastatin (LIPITOR) 40 MG tablet Take 1 tablet (40 mg total) by mouth daily at 6 PM.  . BIOTIN PO Take 1 tablet by mouth daily.   . cholecalciferol (VITAMIN D) 1000 UNITS tablet Take 1,000 Units by mouth daily.   Marland Kitchen erythromycin  ophthalmic ointment Place 1 application into both eyes once. Reported on 03/25/2016  . glucose blood (ONE TOUCH ULTRA TEST) test strip Check blood glucose once daily  . hydrochlorothiazide (HYDRODIURIL) 12.5 MG tablet Take 1 tablet (12.5 mg total) by mouth daily.  . OMEGA-3 FATTY ACIDS PO Take 2 capsules by mouth daily.   Marland Kitchen oxybutynin (DITROPAN) 5 MG tablet Take 1 tablet (5 mg total) by mouth 2 (two) times daily.  . polyethylene glycol (MIRALAX / GLYCOLAX) packet Take 17 g by mouth daily.  . temazepam (RESTORIL) 15 MG capsule Take 1 capsule (15 mg total) by mouth at bedtime.  . [DISCONTINUED] ondansetron (ZOFRAN) 4 MG tablet Take 1 tablet (4 mg total) by mouth daily as needed for nausea or vomiting. (Patient not taking: Reported on 04/20/2017)   No facility-administered encounter medications on file as of 04/20/2017.     Activities of Daily Living In your present state of health, do you have any difficulty performing the following activities: 04/20/2017  Hearing? N  Vision? N  Difficulty concentrating or making decisions? Y  Walking or climbing stairs? N  Dressing or bathing? N  Doing errands, shopping? N  Preparing Food and eating ?  N  Using the Toilet? N  In the past six months, have you accidently leaked urine? Y  Do you have problems with loss of bowel control? N  Managing your Medications? N  Managing your Finances? N  Housekeeping or managing your Housekeeping? N  Some recent data might be hidden    Patient Care Team: Rubye Beach as PCP - General (Family Medicine)    Assessment:     Exercise Activities and Dietary recommendations Current Exercise Habits: The patient does not participate in regular exercise at present, Exercise limited by: None identified  Goals    . Increase water intake          Recommend increasing water intake to 6 glasses a day.      Fall Risk Fall Risk  04/20/2017 04/15/2016  Falls in the past year? No No   Depression Screen PHQ 2/9  Scores 04/20/2017 04/15/2016  PHQ - 2 Score 0 0     Cognitive Function        Immunization History  Administered Date(s) Administered  . Influenza, High Dose Seasonal PF 09/05/2015  . Pneumococcal Conjugate-13 04/02/2014  . Pneumococcal Polysaccharide-23 12/08/2004  . Zoster 03/03/2010   Screening Tests Health Maintenance  Topic Date Due  . FOOT EXAM  01/20/1939  . OPHTHALMOLOGY EXAM  01[birth year]20/1941  . URINE MICROALBUMIN  01/20/1941  . TETANUS/TDAP  01/20/1950  . HEMOGLOBIN A1C  03/08/2017  . INFLUENZA VACCINE  09/06/2017 (Originally 06/16/2017)  . DEXA SCAN  Completed  . PNA vac Low Risk Adult  Completed      Plan:  I have personally reviewed and addressed the Medicare Annual Wellness questionnaire and have noted the following in the patient's chart:  A. Medical and social history B. Use of alcohol, tobacco or illicit drugs  C. Current medications and supplements D. Functional ability and status E.  Nutritional status F.  Physical activity G. Advance directives H. List of other physicians I.  Hospitalizations, surgeries, and ER visits in previous 12 months J.  [place] such as hearing and vision if needed, cognitive and depression L. Referrals and appointments - none  In addition, I have reviewed and discussed with patient certain preventive protocols, quality metrics, and best practice recommendations. A written personalized care plan for preventive services as well as general preventive health recommendations were provided to patient.  See attached scanned questionnaire for additional information.   Signed,  Fabio Neighbors, LPN Nurse Health Advisor   MD Recommendations: Pt needs a foot exam, urine microalbumin test and Hgb A1c checked at today's OV.

## 2017-04-20 NOTE — Progress Notes (Signed)
Patient: [name], Female    DOB: [birth year], 83 y.o.   MRN: [id number] Visit Date: 04/20/2017  Today's Provider: Mar Daring, PA-C   Chief Complaint  Patient presents with  . Follow-up    Diabetes,Hypertension   Subjective:  Patient is here to follow-up on Diabetes, Hypertension. Patient was seen today for her Medicare Wellness by the nurse advisor. Mammogram was done 11/10/16 and Colonoscopy: 05/07/09, patient reports that Dr. Vira Agar told her at 85 she would not need any more even though polyps had been found each time.   Hypertension, follow-up:  BP Readings from Last 3 Encounters:  04/20/17 (!) 146/68  12/16/16 130/70  12/05/16 125/72    She was last seen for hypertension 6 months ago.  BP at that visit was 110/60. Management since that visit includes none. She reports excellent compliance with treatment. She is not having side effects.  She is not exercising. She is adherent to low salt diet.   Outside blood pressures are stable. She is experiencing dizziness and visual disturbances, Reports that she has learn how to cope with the dizziness. Patient denies chest pain, dyspnea, exertional chest pressure/discomfort, fatigue, irregular heart beat, lower extremity edema, near-syncope and palpitations.   Cardiovascular risk factors include advanced age (older than 29 for men, 74 for women), diabetes mellitus, dyslipidemia and hypertension.     Weight trend: stable Wt Readings from Last 3 Encounters:  04/20/17 142 lb 12.8 oz (64.8 kg)  12/16/16 140 lb 3.2 oz (63.6 kg)  12/05/16 141 lb (64 kg)    Current diet: in general, a "healthy" diet    ------------------------------------------------------------------------  Diabetes Mellitus Type II, Follow-up:   Lab Results  Component Value Date   HGBA1C 6.9 09/07/2016   HGBA1C 7.4 (H) 04/15/2016   HGBA1C 6.9 (H) 08/05/2015    Last seen for diabetes 6 months ago.  Management since then includes  none. She reports excellent compliance with treatment. She is not having side effects.  Current symptoms include polyuria and visual disturbances and have been stable. Home blood sugar records: not being checked  Episodes of hypoglycemia? no    Pertinent Labs:    Component Value Date/Time   CHOL 167 04/15/2016 1012   CHOL 177 12/16/2014 0315   TRIG 204 (H) 04/15/2016 1012   TRIG 233 (H) 12/16/2014 0315   HDL 44 04/15/2016 1012   HDL 38 (L) 12/16/2014 0315   LDLCALC 82 04/15/2016 1012   LDLCALC 92 12/16/2014 0315   CREATININE 0.66 12/05/2016 1604   CREATININE 0.94 12/16/2014 0315    Wt Readings from Last 3 Encounters:  04/20/17 142 lb 12.8 oz (64.8 kg)  12/16/16 140 lb 3.2 oz (63.6 kg)  12/05/16 141 lb (64 kg)    ------------------------------------------------------------------------ Patient saw the Cardiologist yesterday for her follow-up visit. Per notes patient stable from a cardiac standpoint. Patient is followed by Vascular surgery for her carotid dopplers.   Review of Systems  Constitutional: Negative.   HENT: Negative.   Eyes: Positive for photophobia, redness, itching and visual disturbance.  Respiratory: Negative for cough, chest tightness and shortness of breath.   Cardiovascular: Negative for chest pain, palpitations and leg swelling.  Gastrointestinal: Positive for diarrhea. Negative for abdominal distention, abdominal pain, anal bleeding, blood in stool, constipation, nausea, rectal pain and vomiting.  Endocrine: Positive for polyuria.  Genitourinary: Negative.   Musculoskeletal: Negative.   Skin: Negative.   Allergic/Immunologic: Negative.   Neurological: Positive for dizziness.  Hematological: Negative.  Psychiatric/Behavioral: Negative.     Social History      She  reports that she quit smoking about 40 years ago. She smoked 0.50 packs per day. She has never used smokeless tobacco. She reports that she does not drink alcohol or use drugs.        Social History   Social History  . Marital status: Widowed    Spouse name: N/A  . Number of children: N/A  . Years of education: N/A   Social History Main Topics  . Smoking status: Former Smoker    Packs/day: 0.50    Quit date: 11/15/1976  . Smokeless tobacco: Never Used  . Alcohol use No  . Drug use: No  . Sexual activity: Not on file   Other Topics Concern  . Not on file   Social History Narrative  . No narrative on file    Past Medical History:  Diagnosis Date  . Cancer ([place])    skin  . Diabetes mellitus without complication ([place])    type 2  . Hyperlipidemia   . Hypertension   . IBS (irritable bowel syndrome)      Patient Active Problem List   Diagnosis Date Noted  . Diabetes mellitus without complication ([place]) 40/98/1191  . Allergic rhinitis 08/05/2015  . Aortic heart valve narrowing 08/05/2015  . Breast cyst 08/05/2015  . Carotid artery narrowing 08/05/2015  . Colon polyp 08/05/2015  . Cerebral vascular accident ([place]) 08/05/2015  . DD (diverticular disease) 08/05/2015  . Elevated CK 08/05/2015  . Acid reflux 08/05/2015  . Headache disorder 08/05/2015  . Calcium blood increased 08/05/2015  . HLD (hyperlipidemia) 08/05/2015  . BP (high blood pressure) 08/05/2015  . Cannot sleep 08/05/2015  . LBP (low back pain) 08/05/2015  . OP (osteoporosis) 08/05/2015  . Adiposity 08/05/2015  . Abnormal Pap smear of vagina 08/05/2015  . Hemorrhage, postmenopausal 08/05/2015  . Superficial thrombophlebitis 08/05/2015  . FOM (frequency of micturition) 08/05/2015  . Fatigue 08/05/2015    Past Surgical History:  Procedure Laterality Date  . BREAST BIOPSY Right    neg  . BREAST SURGERY     1980's  . CAROTID ARTERY ANGIOPLASTY Left 01/18/2015   Dr. Delana Meyer [facility]  . CHOLECYSTECTOMY  06/27/2012  . DECOMPRESSIVE LUMBAR LAMINECTOMY LEVEL 1  1950  . EYE SURGERY Right 2012   cataract extraction    Family History        Family Status  Relation Status  . Mother  Deceased at age 56  . Brother Alive  . Brother Deceased at age 28  . Brother Deceased  . Father Deceased at age 33       in fire  . Daughter Alive        Her family history includes Alzheimer's disease in her brother; Dementia in her brother; Diabetes in her daughter; Gout in her brother; Heart attack in her brother and brother; Heart disease in her brother; Ovarian cancer in her mother.     Allergies  Allergen Reactions  . Cephalosporins   . Codeine   . Macrolides And Ketolides   . Penicillins   . Tramadol Nausea Only  . Sulfa Antibiotics Rash     Current Outpatient Prescriptions:  .  amLODipine (NORVASC) 10 MG tablet, Take 1 tablet (10 mg total) by mouth daily., Disp: 90 tablet, Rfl: 3 .  aspirin 325 MG EC tablet, Take 325 mg by mouth daily. , Disp: , Rfl:  .  atorvastatin (LIPITOR) 40 MG tablet, Take 1 tablet (  40 mg total) by mouth daily at 6 PM., Disp: 90 tablet, Rfl: 3 .  BIOTIN PO, Take 1 tablet by mouth daily. , Disp: , Rfl:  .  cholecalciferol (VITAMIN D) 1000 UNITS tablet, Take 1,000 Units by mouth daily. , Disp: , Rfl:  .  erythromycin ophthalmic ointment, Place 1 application into both eyes once. Reported on 03/25/2016, Disp: , Rfl:  .  glucose blood (ONE TOUCH ULTRA TEST) test strip, Check blood glucose once daily, Disp: 100 each, Rfl: 12 .  hydrochlorothiazide (HYDRODIURIL) 12.5 MG tablet, Take 1 tablet (12.5 mg total) by mouth daily., Disp: 90 tablet, Rfl: 3 .  OMEGA-3 FATTY ACIDS PO, Take 2 capsules by mouth daily. , Disp: , Rfl:  .  oxybutynin (DITROPAN) 5 MG tablet, Take 1 tablet (5 mg total) by mouth 2 (two) times daily., Disp: 180 tablet, Rfl: 3 .  polyethylene glycol (MIRALAX / GLYCOLAX) packet, Take 17 g by mouth daily., Disp: , Rfl:  .  temazepam (RESTORIL) 15 MG capsule, Take 1 capsule (15 mg total) by mouth at bedtime., Disp: 90 capsule, Rfl: 1   Patient Care Team: Mar Daring, PA-C as PCP - General (Family Medicine) Eulogio Bear, MD as  Consulting Physician (Ophthalmology) Teodoro Spray, MD as Consulting Physician (Cardiology) Schnier, Dolores Lory, MD as Consulting Physician (Vascular Surgery)      Objective:   Vitals: BP (!) 146/68 (BP Location: Right Arm)   Pulse 72   Temp 98.7 F (37.1 C) (Oral)   Ht 5\' 2"  (1.575 m)   Wt 142 lb 12.8 oz (64.8 kg)   BMI 26.12 kg/m   Body mass index is 26.12 kg/m.   Physical Exam  Constitutional: She is oriented to person, place, and time. She appears well-developed and well-nourished. No distress.  HENT:  Head: Normocephalic and atraumatic.  Right Ear: Hearing, tympanic membrane, external ear and ear canal normal.  Left Ear: Hearing, tympanic membrane, external ear and ear canal normal.  Nose: Nose normal.  Mouth/Throat: Uvula is midline, oropharynx is clear and moist and mucous membranes are normal. No oropharyngeal exudate.  Eyes: Conjunctivae and EOM are normal. Pupils are equal, round, and reactive to light. Right eye exhibits no discharge. Left eye exhibits no discharge. No scleral icterus.  Neck: Normal range of motion. Neck supple. No JVD present. Carotid bruit is present (right bruit). No tracheal deviation present. No thyromegaly present.    Cardiovascular: Normal rate, regular rhythm and intact distal pulses.  Exam reveals no gallop and no friction rub.   Murmur heard.  Systolic murmur is present with a grade of 3/6  Pulmonary/Chest: Effort normal and breath sounds normal. No respiratory distress. She has no wheezes. She has no rales. She exhibits no tenderness.  Abdominal: Soft. Bowel sounds are normal. She exhibits no distension and no mass. There is no tenderness. There is no rebound and no guarding.  Musculoskeletal: Normal range of motion. She exhibits no edema or tenderness.  Lymphadenopathy:    She has no cervical adenopathy.  Neurological: She is alert and oriented to person, place, and time.  Skin: Skin is warm and dry. No rash noted. She is not  diaphoretic.  Psychiatric: She has a normal mood and affect. Her behavior is normal. Judgment and thought content normal.  Vitals reviewed.    Depression Screen PHQ 2/9 Scores 04/20/2017 04/20/2017 04/15/2016  PHQ - 2 Score 0 0 0  PHQ- 9 Score 3 - -      Assessment & Plan:  Routine Health Maintenance and Physical Exam  Exercise Activities and Dietary recommendations Goals    . Increase water intake          Recommend increasing water intake to 6 glasses a day.       Immunization History  Administered Date(s) Administered  . Influenza, High Dose Seasonal PF 09/05/2015  . Pneumococcal Conjugate-13 04/02/2014  . Pneumococcal Polysaccharide-23 12/08/2004  . Zoster 03/03/2010    Health Maintenance  Topic Date Due  . FOOT EXAM  01/20/1941  . URINE MICROALBUMIN  01/20/1941  . HEMOGLOBIN A1C  03/08/2017  . INFLUENZA VACCINE  09/06/2017 (Originally 06/16/2017)  . TETANUS/TDAP  11/16/2026 (Originally 01/20/1950)  . OPHTHALMOLOGY EXAM  09/16/2017  . DEXA SCAN  Completed  . PNA vac Low Risk Adult  Completed     Discussed health benefits of physical activity, and encouraged her to engage in regular exercise appropriate for her age and condition.    1. Irritable bowel syndrome with diarrhea New onset. Patient has been using IBGard OTC with some relief but the peppermint oil is causing her heartburn. Would like to try a Rx strength medication. Alosetron is what is approved through her insurance so will order that as below for her to try for one month to see if it helps her symptoms of cramping and diarrhea. If it does not may consider bentyl or low dose linzess.  - alosetron (LOTRONEX) 0.5 MG tablet; Take 1 tablet (0.5 mg total) by mouth 2 (two) times daily.  Dispense: 30 tablet; Refill: 0 - CBC w/Diff/Platelet  2. Essential hypertension Stable. Diagnosis pulled for medication refill. Continue current medical treatment plan. Will check labs as below and f/u pending results. -  amLODipine (NORVASC) 10 MG tablet; Take 1 tablet (10 mg total) by mouth daily.  Dispense: 90 tablet; Refill: 3 - hydrochlorothiazide (HYDRODIURIL) 12.5 MG tablet; Take 1 tablet (12.5 mg total) by mouth daily.  Dispense: 90 tablet; Refill: 3 - CBC w/Diff/Platelet - Comprehensive Metabolic Panel (CMET)  3. Insomnia, unspecified type Patient reports insomnia still only partially resolved with temazepam. Some nights she can sleep well, but others she will still awaken almost hourly. No known trigger for awakening. She reports she tried multiple medications with Dr. Venia Minks and temazepam was only medication that worked well enough.  - temazepam (RESTORIL) 15 MG capsule; Take 1 capsule (15 mg total) by mouth at bedtime.  Dispense: 90 capsule; Refill: 1  4. Pure hypercholesterolemia Stable. Diagnosis pulled for medication refill. Continue current medical treatment plan. Will check labs as below and f/u pending results. - atorvastatin (LIPITOR) 40 MG tablet; Take 1 tablet (40 mg total) by mouth daily at 6 PM.  Dispense: 90 tablet; Refill: 3 - Lipid panel  5. FOM (frequency of micturition) Stable. Diagnosis pulled for medication refill. Continue current medical treatment plan. - oxybutynin (DITROPAN) 5 MG tablet; Take 1 tablet (5 mg total) by mouth 2 (two) times daily.  Dispense: 180 tablet; Refill: 3  6. Age-related osteoporosis without current pathological fracture Patient does not get bone densities any longer due to patient preference. Last BMD showed T score -2.5, osteoporosis.   7. Bilateral carotid artery stenosis Followed by Dr. Delana Meyer. Will have repeat imaging in Dec 2018.  8. Diabetes mellitus without complication (HCC) Urine microalbumin 100 today. Patient denies wanting to start an ACE/ARB at this time. Will check labs as below and f/u pending results. - Comprehensive Metabolic Panel (CMET) - HgB A1c - POCT UA -  Microalbumin  --------------------------------------------------------------------  Mar Daring, PA-C  [facility]

## 2017-04-20 NOTE — Progress Notes (Deleted)
       Patient: [name] Female    DOB: 03-03-1929   83 y.o.   MRN: [id number] Visit Date: 04/20/2017  Today's Provider: Mar Daring, PA-C   No chief complaint on file.  Subjective:    HPI Patient here to follow-up Hypertension.      Allergies  Allergen Reactions  . Cephalosporins   . Codeine   . Macrolides And Ketolides   . Penicillins   . Tramadol Nausea Only  . Sulfa Antibiotics Rash     Current Outpatient Prescriptions:  .  amLODipine (NORVASC) 10 MG tablet, Take 1 tablet (10 mg total) by mouth daily., Disp: 90 tablet, Rfl: 3 .  aspirin 325 MG EC tablet, Take 325 mg by mouth daily. , Disp: , Rfl:  .  atorvastatin (LIPITOR) 40 MG tablet, Take 1 tablet (40 mg total) by mouth daily at 6 PM., Disp: 90 tablet, Rfl: 3 .  BIOTIN PO, Take 1 tablet by mouth daily. , Disp: , Rfl:  .  cholecalciferol (VITAMIN D) 1000 UNITS tablet, Take 1,000 Units by mouth daily. , Disp: , Rfl:  .  erythromycin ophthalmic ointment, 1 application at bedtime. Reported on 03/25/2016, Disp: , Rfl:  .  glucose blood (ONE TOUCH ULTRA TEST) test strip, Check blood glucose once daily, Disp: 100 each, Rfl: 12 .  hydrochlorothiazide (HYDRODIURIL) 12.5 MG tablet, Take 1 tablet (12.5 mg total) by mouth daily., Disp: 90 tablet, Rfl: 3 .  OMEGA-3 FATTY ACIDS PO, Take 2 capsules by mouth daily. , Disp: , Rfl:  .  ondansetron (ZOFRAN) 4 MG tablet, Take 1 tablet (4 mg total) by mouth daily as needed for nausea or vomiting., Disp: 14 tablet, Rfl: 0 .  oxybutynin (DITROPAN) 5 MG tablet, Take 1 tablet (5 mg total) by mouth 2 (two) times daily., Disp: 180 tablet, Rfl: 3 .  polyethylene glycol (MIRALAX / GLYCOLAX) packet, Take 17 g by mouth daily., Disp: , Rfl:  .  temazepam (RESTORIL) 15 MG capsule, Take 1 capsule (15 mg total) by mouth at bedtime., Disp: 90 capsule, Rfl: 1  Review of Systems  Social History  Substance Use Topics  . Smoking status: Former Smoker    Packs/day: 0.50    Quit date:  11/15/1976  . Smokeless tobacco: Never Used  . Alcohol use No   Objective:   There were no vitals taken for this visit.   Physical Exam      Assessment & Plan:           Mar Daring, PA-C  [facility]

## 2017-04-20 NOTE — Patient Instructions (Signed)
[name] , Thank you for taking time to come for your Medicare Wellness Visit. I appreciate your ongoing commitment to your health goals. Please review the following plan we discussed and let me know if I can assist you in the future.   Screening recommendations/referrals: Colonoscopy: completed 05/07/09  Mammogram: completed 11/10/16 Bone Density: completed 03/15/12 Recommended yearly ophthalmology/optometry visit for glaucoma screening and checkup Recommended yearly dental visit for hygiene and checkup  Vaccinations: Influenza vaccine: due 07/2017 Pneumococcal vaccine: completed series Tdap vaccine: declined today Shingles vaccine: completed 03/03/10  Advanced directives: Please bring a copy of your POA (Power of [place]) and/or Living Will to your next appointment.   Conditions/risks identified: Recommend increasing water intake to 6 glasses a day.  Next appointment: None, need to schedule 1 year AWV.   Preventive Care 81 Years and Older, Female Preventive care refers to lifestyle choices and visits with your health care provider that can promote health and wellness. What does preventive care include?  A yearly physical exam. This is also called an annual well check.  Dental exams once or twice a year.  Routine eye exams. Ask your health care provider how often you should have your eyes checked.  Personal lifestyle choices, including:  Daily care of your teeth and gums.  Regular physical activity.  Eating a healthy diet.  Avoiding tobacco and drug use.  Limiting alcohol use.  Practicing safe sex.  Taking low-dose aspirin every day.  Taking vitamin and mineral supplements as recommended by your health care provider. What happens during an annual well check? The services and screenings done by your health care provider during your annual well check will depend on your age, overall health, lifestyle risk factors, and family history of disease. Counseling  Your  health care provider may ask you questions about your:  Alcohol use.  Tobacco use.  Drug use.  Emotional well-being.  Home and relationship well-being.  Sexual activity.  Eating habits.  History of falls.  Memory and ability to understand (cognition).  Work and work Statistician.  Reproductive health. Screening  You may have the following tests or measurements:  Height, weight, and BMI.  Blood pressure.  Lipid and cholesterol levels. These may be checked every 5 years, or more frequently if you are over 17 years old.  Skin check.  Lung cancer screening. You may have this screening every year starting at age 4 if you have a 30-pack-year history of smoking and currently smoke or have quit within the past 15 years.  Fecal occult blood test (FOBT) of the stool. You may have this test every year starting at age 52.  Flexible sigmoidoscopy or colonoscopy. You may have a sigmoidoscopy every 5 years or a colonoscopy every 10 years starting at age 75.  Hepatitis C blood test.  Hepatitis B blood test.  Sexually transmitted disease (STD) testing.  Diabetes screening. This is done by checking your blood sugar (glucose) after you have not eaten for a while (fasting). You may have this done every 1-3 years.  Bone density scan. This is done to screen for osteoporosis. You may have this done starting at age 16.  Mammogram. This may be done every 1-2 years. Talk to your health care provider about how often you should have regular mammograms. Talk with your health care provider about your test results, treatment options, and if necessary, the need for more tests. Vaccines  Your health care provider may recommend certain vaccines, such as:  Influenza vaccine. This is recommended  every year.  Tetanus, diphtheria, and acellular pertussis (Tdap, Td) vaccine. You may need a Td booster every 10 years.  Zoster vaccine. You may need this after age 33.  Pneumococcal 13-valent  conjugate (PCV13) vaccine. One dose is recommended after age 73.  Pneumococcal polysaccharide (PPSV23) vaccine. One dose is recommended after age 70. Talk to your health care provider about which screenings and vaccines you need and how often you need them. This information is not intended to replace advice given to you by your health care provider. Make sure you discuss any questions you have with your health care provider. Document Released: 11/29/2015 Document Revised: 07/22/2016 Document Reviewed: 09/03/2015 Elsevier Interactive Patient Education  2017 [place] Prevention in the Home Falls can cause injuries. They can happen to people of all ages. There are many things you can do to make your home safe and to help prevent falls. What can I do on the outside of my home?  Regularly fix the edges of walkways and driveways and fix any cracks.  Remove anything that might make you trip as you walk through a door, such as a raised step or threshold.  Trim any bushes or trees on the path to your home.  Use bright outdoor lighting.  Clear any walking paths of anything that might make someone trip, such as rocks or tools.  Regularly check to see if handrails are loose or broken. Make sure that both sides of any steps have handrails.  Any raised decks and porches should have guardrails on the edges.  Have any leaves, snow, or ice cleared regularly.  Use sand or salt on walking paths during winter.  Clean up any spills in your garage right away. This includes oil or grease spills. What can I do in the bathroom?  Use night lights.  Install grab bars by the toilet and in the tub and shower. Do not use towel bars as grab bars.  Use non-skid mats or decals in the tub or shower.  If you need to sit down in the shower, use a plastic, non-slip stool.  Keep the floor dry. Clean up any water that spills on the floor as soon as it happens.  Remove soap buildup in the tub or  shower regularly.  Attach bath mats securely with double-sided non-slip rug tape.  Do not have throw rugs and other things on the floor that can make you trip. What can I do in the bedroom?  Use night lights.  Make sure that you have a light by your bed that is easy to reach.  Do not use any sheets or blankets that are too big for your bed. They should not hang down onto the floor.  Have a firm chair that has side arms. You can use this for support while you get dressed.  Do not have throw rugs and other things on the floor that can make you trip. What can I do in the kitchen?  Clean up any spills right away.  Avoid walking on wet floors.  Keep items that you use a lot in easy-to-reach places.  If you need to reach something above you, use a strong step stool that has a grab bar.  Keep electrical cords out of the way.  Do not use floor polish or wax that makes floors slippery. If you must use wax, use non-skid floor wax.  Do not have throw rugs and other things on the floor that can make you trip. What  can I do with my stairs?  Do not leave any items on the stairs.  Make sure that there are handrails on both sides of the stairs and use them. Fix handrails that are broken or loose. Make sure that handrails are as long as the stairways.  Check any carpeting to make sure that it is firmly attached to the stairs. Fix any carpet that is loose or worn.  Avoid having throw rugs at the top or bottom of the stairs. If you do have throw rugs, attach them to the floor with carpet tape.  Make sure that you have a light switch at the top of the stairs and the bottom of the stairs. If you do not have them, ask someone to add them for you. What else can I do to help prevent falls?  Wear shoes that:  Do not have high heels.  Have rubber bottoms.  Are comfortable and fit you well.  Are closed at the toe. Do not wear sandals.  If you use a stepladder:  Make sure that it is fully  opened. Do not climb a closed stepladder.  Make sure that both sides of the stepladder are locked into place.  Ask someone to hold it for you, if possible.  Clearly mark and make sure that you can see:  Any grab bars or handrails.  First and last steps.  Where the edge of each step is.  Use tools that help you move around (mobility aids) if they are needed. These include:  Canes.  Walkers.  Scooters.  Crutches.  Turn on the lights when you go into a dark area. Replace any light bulbs as soon as they burn out.  Set up your furniture so you have a clear path. Avoid moving your furniture around.  If any of your floors are uneven, fix them.  If there are any pets around you, be aware of where they are.  Review your medicines with your doctor. Some medicines can make you feel dizzy. This can increase your chance of falling. Ask your doctor what other things that you can do to help prevent falls. This information is not intended to replace advice given to you by your health care provider. Make sure you discuss any questions you have with your health care provider. Document Released: 08/29/2009 Document Revised: 04/09/2016 Document Reviewed: 12/07/2014 Elsevier Interactive Patient Education  2017 [employer].

## 2017-04-21 ENCOUNTER — Telehealth: Payer: Self-pay

## 2017-04-21 DIAGNOSIS — E119 Type 2 diabetes mellitus without complications: Secondary | ICD-10-CM

## 2017-04-21 LAB — COMPREHENSIVE METABOLIC PANEL
A/G RATIO: 1.7 (ref 1.2–2.2)
ALBUMIN: 4.5 g/dL (ref 3.5–4.7)
ALT: 28 IU/L (ref 0–32)
AST: 21 IU/L (ref 0–40)
Alkaline Phosphatase: 80 IU/L (ref 39–117)
BILIRUBIN TOTAL: 0.8 mg/dL (ref 0.0–1.2)
BUN / CREAT RATIO: 20 (ref 12–28)
BUN: 12 mg/dL (ref 8–27)
CHLORIDE: 101 mmol/L (ref 96–106)
CO2: 25 mmol/L (ref 18–29)
Calcium: 9.8 mg/dL (ref 8.7–10.3)
Creatinine, Ser: 0.61 mg/dL (ref 0.57–1.00)
GFR calc non Af Amer: 82 mL/min/{1.73_m2} (ref >59)
GFR, EST AFRICAN AMERICAN: 95 mL/min/{1.73_m2} (ref >59)
GLOBULIN, TOTAL: 2.7 g/dL (ref 1.5–4.5)
GLUCOSE: 158 mg/dL — AB (ref 65–99)
Potassium: 4.1 mmol/L (ref 3.5–5.2)
Sodium: 142 mmol/L (ref 134–144)
TOTAL PROTEIN: 7.2 g/dL (ref 6.0–8.5)

## 2017-04-21 LAB — CBC WITH DIFFERENTIAL/PLATELET
BASOS: 1 %
Basophils Absolute: 0 10*3/uL (ref 0.0–0.2)
EOS (ABSOLUTE): 0.2 10*3/uL (ref 0.0–0.4)
Eos: 2 %
HEMATOCRIT: 42.9 % (ref 34.0–46.6)
HEMOGLOBIN: 14.8 g/dL (ref 11.1–15.9)
IMMATURE GRANS (ABS): 0 10*3/uL (ref 0.0–0.1)
Immature Granulocytes: 0 %
LYMPHS: 34 %
Lymphocytes Absolute: 2.4 10*3/uL (ref 0.7–3.1)
MCH: 29.7 pg (ref 26.6–33.0)
MCHC: 34.5 g/dL (ref 31.5–35.7)
MCV: 86 fL (ref 79–97)
MONOCYTES: 6 %
Monocytes Absolute: 0.5 10*3/uL (ref 0.1–0.9)
NEUTROS ABS: 4 10*3/uL (ref 1.4–7.0)
Neutrophils: 57 %
Platelets: 161 10*3/uL (ref 150–379)
RBC: 4.98 x10E6/uL (ref 3.77–5.28)
RDW: 13.5 % (ref 12.3–15.4)
WBC: 7 10*3/uL (ref 3.4–10.8)

## 2017-04-21 LAB — LIPID PANEL
Chol/HDL Ratio: 3.7 ratio (ref 0.0–4.4)
Cholesterol, Total: 159 mg/dL (ref 100–199)
HDL: 43 mg/dL (ref >39)
LDL Calculated: 81 mg/dL (ref 0–99)
Triglycerides: 173 mg/dL — ABNORMAL HIGH (ref 0–149)
VLDL CHOLESTEROL CAL: 35 mg/dL (ref 5–40)

## 2017-04-21 LAB — HEMOGLOBIN A1C
Est. average glucose Bld gHb Est-mCnc: 160 mg/dL
HEMOGLOBIN A1C: 7.2 % — AB (ref 4.8–5.6)

## 2017-04-21 MED ORDER — METFORMIN HCL 500 MG PO TABS: 500.0000 mg | ORAL_TABLET | Freq: Two times a day (BID) | ORAL | 5 refills | Status: DC

## 2017-04-21 NOTE — Telephone Encounter (Signed)
Metformin 500mg  sent in for her to take twice daily with meals.

## 2017-04-21 NOTE — Telephone Encounter (Signed)
Patient advised as directed below. She wants Tawanna Sat to know that the Alosetron medication that was prescribed for IBS was $120 dollars under the Generic, so states she told the pharmacist"forget it". Thanks,  -Burnell Hurta

## 2017-04-21 NOTE — Telephone Encounter (Signed)
-----   Message from Mar Daring, [place] sent at 04/21/2017  9:15 AM EDT ----- Labs are stable with exception of sugar which is up from last year. A1c increased from 6.9 last year to 7.2 this year. Would recommend adding something to help lower blood sugar if agreeable. Continue to work on lifestyle modifications and cut back on carbohydrates and sugars.

## 2017-04-21 NOTE — Addendum Note (Signed)
Addended by: Mar Daring on: 04/21/2017 03:38 PM   Modules accepted: Orders

## 2017-04-21 NOTE — Telephone Encounter (Signed)
Patient advised as directed below. She agreed to start a generic medication please.  Thanks,  -Joseline

## 2017-05-03 DIAGNOSIS — C44329 Squamous cell carcinoma of skin of other parts of face: Secondary | ICD-10-CM | POA: Diagnosis not present

## 2017-05-03 DIAGNOSIS — L905 Scar conditions and fibrosis of skin: Secondary | ICD-10-CM | POA: Diagnosis not present

## 2017-05-17 DIAGNOSIS — I350 Nonrheumatic aortic (valve) stenosis: Secondary | ICD-10-CM | POA: Diagnosis not present

## 2017-07-16 ENCOUNTER — Telehealth: Payer: Self-pay | Admitting: Physician Assistant

## 2017-07-16 DIAGNOSIS — G4700 Insomnia, unspecified: Secondary | ICD-10-CM

## 2017-07-16 MED ORDER — TEMAZEPAM 15 MG PO CAPS: 15.0000 mg | ORAL_CAPSULE | Freq: Every day | ORAL | 1 refills | Status: DC

## 2017-07-16 NOTE — Telephone Encounter (Signed)
Rx printed and will have someone phone in for her. Thanks.

## 2017-07-16 NOTE — Telephone Encounter (Signed)
Patient requesting refill ontemazepam (RESTORIL) 15 MG capsule    [facility] [address]

## 2017-08-09 DIAGNOSIS — D485 Neoplasm of uncertain behavior of skin: Secondary | ICD-10-CM | POA: Diagnosis not present

## 2017-08-09 DIAGNOSIS — D2272 Melanocytic nevi of left lower limb, including hip: Secondary | ICD-10-CM | POA: Diagnosis not present

## 2017-08-09 DIAGNOSIS — D044 Carcinoma in situ of skin of scalp and neck: Secondary | ICD-10-CM | POA: Diagnosis not present

## 2017-08-09 DIAGNOSIS — L570 Actinic keratosis: Secondary | ICD-10-CM | POA: Diagnosis not present

## 2017-08-09 DIAGNOSIS — D2261 Melanocytic nevi of right upper limb, including shoulder: Secondary | ICD-10-CM | POA: Diagnosis not present

## 2017-08-09 DIAGNOSIS — Z85828 Personal history of other malignant neoplasm of skin: Secondary | ICD-10-CM | POA: Diagnosis not present

## 2017-08-09 DIAGNOSIS — D225 Melanocytic nevi of trunk: Secondary | ICD-10-CM | POA: Diagnosis not present

## 2017-08-31 ENCOUNTER — Other Ambulatory Visit: Payer: Self-pay | Admitting: Physician Assistant

## 2017-08-31 DIAGNOSIS — Z23 Encounter for immunization: Secondary | ICD-10-CM | POA: Diagnosis not present

## 2017-08-31 DIAGNOSIS — Z1231 Encounter for screening mammogram for malignant neoplasm of breast: Secondary | ICD-10-CM

## 2017-09-27 DIAGNOSIS — D044 Carcinoma in situ of skin of scalp and neck: Secondary | ICD-10-CM | POA: Diagnosis not present

## 2017-09-27 DIAGNOSIS — C4442 Squamous cell carcinoma of skin of scalp and neck: Secondary | ICD-10-CM | POA: Diagnosis not present

## 2017-09-27 DIAGNOSIS — L905 Scar conditions and fibrosis of skin: Secondary | ICD-10-CM | POA: Diagnosis not present

## 2017-09-30 ENCOUNTER — Encounter: Payer: Self-pay | Admitting: Physician Assistant

## 2017-09-30 ENCOUNTER — Ambulatory Visit (INDEPENDENT_AMBULATORY_CARE_PROVIDER_SITE_OTHER): Payer: Medicare Other | Admitting: Physician Assistant

## 2017-09-30 VITALS — BP 122/74 | HR 72 | Temp 97.9°F | Resp 16 | Wt 135.0 lb

## 2017-09-30 DIAGNOSIS — J029 Acute pharyngitis, unspecified: Secondary | ICD-10-CM

## 2017-09-30 MED ORDER — DOXYCYCLINE HYCLATE 100 MG PO TABS: 100.0000 mg | ORAL_TABLET | Freq: Two times a day (BID) | ORAL | 0 refills | Status: AC

## 2017-09-30 NOTE — Patient Instructions (Signed)

## 2017-09-30 NOTE — Progress Notes (Signed)
Patient: [name] Female    DOB: December 02, 1928   83 y.o.   MRN: [id number] Visit Date: 09/30/2017  Today's Provider: Trinna Post, PA-C   Chief Complaint  Patient presents with  . Sore Throat    Started Sunday   Subjective:    [name] is an 83 year old female with recent skin lesion removal by Dr. Evorn Gong presenting today with sore throat x 6 days.  Sore Throat   This is a new problem. The current episode started in the past 7 days. The problem has been gradually worsening. Neither side of throat is experiencing more pain than the other. There has been no fever. Associated symptoms include coughing. Pertinent negatives include no congestion, ear discharge, ear pain, headaches, shortness of breath, stridor or trouble swallowing. Treatments tried: Pt tried some cough drops. The treatment provided no relief.       Allergies  Allergen Reactions  . Cephalosporins   . Codeine   . Macrolides And Ketolides   . Penicillins   . Tramadol Nausea Only  . Sulfa Antibiotics Rash     Current Outpatient Medications:  .  amLODipine (NORVASC) 10 MG tablet, Take 1 tablet (10 mg total) by mouth daily., Disp: 90 tablet, Rfl: 3 .  aspirin 325 MG EC tablet, Take 325 mg by mouth daily. , Disp: , Rfl:  .  atorvastatin (LIPITOR) 40 MG tablet, Take 1 tablet (40 mg total) by mouth daily at 6 PM., Disp: 90 tablet, Rfl: 3 .  BIOTIN PO, Take 1 tablet by mouth daily. , Disp: , Rfl:  .  cholecalciferol (VITAMIN D) 1000 UNITS tablet, Take 1,000 Units by mouth daily. , Disp: , Rfl:  .  erythromycin ophthalmic ointment, Place 1 application into both eyes once. Reported on 03/25/2016, Disp: , Rfl:  .  glucose blood (ONE TOUCH ULTRA TEST) test strip, Check blood glucose once daily, Disp: 100 each, Rfl: 12 .  hydrochlorothiazide (HYDRODIURIL) 12.5 MG tablet, Take 1 tablet (12.5 mg total) by mouth daily., Disp: 90 tablet, Rfl: 3 .  metFORMIN (GLUCOPHAGE) 500 MG tablet, Take 1 tablet (500 mg total)  by mouth 2 (two) times daily with a meal., Disp: 60 tablet, Rfl: 5 .  OMEGA-3 FATTY ACIDS PO, Take 2 capsules by mouth daily. , Disp: , Rfl:  .  oxybutynin (DITROPAN) 5 MG tablet, Take 1 tablet (5 mg total) by mouth 2 (two) times daily., Disp: 180 tablet, Rfl: 3 .  polyethylene glycol (MIRALAX / GLYCOLAX) packet, Take 17 g by mouth daily., Disp: , Rfl:  .  temazepam (RESTORIL) 15 MG capsule, Take 1 capsule (15 mg total) by mouth at bedtime., Disp: 90 capsule, Rfl: 1 .  doxycycline (VIBRA-TABS) 100 MG tablet, Take 1 tablet (100 mg total) 2 (two) times daily for 7 days by mouth., Disp: 14 tablet, Rfl: 0  Review of Systems  Constitutional: Positive for fatigue and fever. Negative for activity change, appetite change, chills, diaphoresis and unexpected weight change.  HENT: Positive for sore throat. Negative for congestion, ear discharge, ear pain, hearing loss, mouth sores, nosebleeds, postnasal drip, rhinorrhea, sinus pressure, sinus pain, sneezing, tinnitus, trouble swallowing and voice change.   Respiratory: Positive for cough. Negative for apnea, choking, chest tightness, shortness of breath, wheezing and stridor.   Gastrointestinal: Negative.   Neurological: Negative for dizziness, light-headedness and headaches.    Social History   Tobacco Use  . Smoking status: Former Smoker    Packs/day: 0.50  Last attempt to quit: 11/15/1976    Years since quitting: 40.9  . Smokeless tobacco: Never Used  Substance Use Topics  . Alcohol use: No   Objective:   BP 122/74 (BP Location: Right Arm, Patient Position: Sitting, Cuff Size: Normal)   Pulse 72   Temp 97.9 F (36.6 C) (Oral)   Resp 16   Wt 135 lb (61.2 kg)   BMI 24.69 kg/m  Vitals:   09/30/17 1107  BP: 122/74  Pulse: 72  Resp: 16  Temp: 97.9 F (36.6 C)  TempSrc: Oral  Weight: 135 lb (61.2 kg)     Physical Exam  Constitutional: She appears well-developed and well-nourished.  HENT:  Right Ear: External ear normal.  Left  Ear: External ear normal.  Mouth/Throat: Posterior oropharyngeal erythema present.  Eyes: Right eye exhibits discharge. Left eye exhibits discharge.  Neck: Neck supple.  Cardiovascular: Normal rate and regular rhythm.  Murmur heard. Pulmonary/Chest: Effort normal and breath sounds normal. No respiratory distress. She has no rales.  Lymphadenopathy:    She has no cervical adenopathy.  Skin: Skin is warm and dry.  Psychiatric: She has a normal mood and affect. Her behavior is normal.        Assessment & Plan:     1. Pharyngitis, unspecified etiology  Think this is likely viral. Encouraged patient to wait for improvement. Hard script doxycycline given at request.   - doxycycline (VIBRA-TABS) 100 MG tablet; Take 1 tablet (100 mg total) 2 (two) times daily for 7 days by mouth.  Dispense: 14 tablet; Refill: 0  No Follow-up on file.  The entirety of the information documented in the History of Present Illness, Review of Systems and Physical Exam were personally obtained by me. Portions of this information were initially documented by Ashley Royalty, CMA and reviewed by me for thoroughness and accuracy.         Trinna Post, PA-C  [facility]

## 2017-10-15 ENCOUNTER — Telehealth: Payer: Self-pay | Admitting: Physician Assistant

## 2017-10-15 DIAGNOSIS — G4700 Insomnia, unspecified: Secondary | ICD-10-CM

## 2017-10-15 MED ORDER — TEMAZEPAM 15 MG PO CAPS: 15.0000 mg | ORAL_CAPSULE | Freq: Every day | ORAL | 1 refills | Status: DC

## 2017-10-15 NOTE — Telephone Encounter (Signed)
Prescription called in to [facility]. LM for the phamacy.  Thanks,  -Joseline

## 2017-10-15 NOTE — Telephone Encounter (Signed)
Ok to phone in Temazepam 15mg  Take 1 tab PO q hs prn sleep. #90 1 refill to [facility] [address]. [phone]

## 2017-10-15 NOTE — Telephone Encounter (Signed)
Patient requesting refill on her temazepam (RESTORIL) 15 MG capsule   [place]

## 2017-10-18 DIAGNOSIS — I6529 Occlusion and stenosis of unspecified carotid artery: Secondary | ICD-10-CM | POA: Diagnosis not present

## 2017-10-18 DIAGNOSIS — I10 Essential (primary) hypertension: Secondary | ICD-10-CM | POA: Diagnosis not present

## 2017-10-18 DIAGNOSIS — I350 Nonrheumatic aortic (valve) stenosis: Secondary | ICD-10-CM | POA: Diagnosis not present

## 2017-10-26 ENCOUNTER — Other Ambulatory Visit: Payer: Self-pay | Admitting: Physician Assistant

## 2017-10-26 DIAGNOSIS — E119 Type 2 diabetes mellitus without complications: Secondary | ICD-10-CM

## 2017-11-02 ENCOUNTER — Other Ambulatory Visit
Admission: RE | Admit: 2017-11-02 | Discharge: 2017-11-02 | Disposition: A | Discharge status: Home / Self Care | Payer: Medicare Other | Source: Ambulatory Visit | Attending: Nurse Practitioner | Admitting: Nurse Practitioner

## 2017-11-02 DIAGNOSIS — R197 Diarrhea, unspecified: Secondary | ICD-10-CM | POA: Insufficient documentation

## 2017-11-02 LAB — GASTROINTESTINAL PANEL BY PCR, STOOL (REPLACES STOOL CULTURE)

## 2017-11-02 LAB — C DIFFICILE QUICK SCREEN W PCR REFLEX
C Diff antigen: NEGATIVE
C Diff interpretation: NOT DETECTED
C Diff toxin: NEGATIVE

## 2017-11-04 ENCOUNTER — Ambulatory Visit (INDEPENDENT_AMBULATORY_CARE_PROVIDER_SITE_OTHER): Payer: Self-pay | Admitting: Vascular Surgery

## 2017-11-04 ENCOUNTER — Encounter (INDEPENDENT_AMBULATORY_CARE_PROVIDER_SITE_OTHER): Payer: Self-pay

## 2017-11-23 ENCOUNTER — Ambulatory Visit
Admission: RE | Admit: 2017-11-23 | Discharge: 2017-11-23 | Disposition: A | Discharge status: Home / Self Care | Payer: Medicare Other | Source: Ambulatory Visit | Attending: Physician Assistant | Admitting: Physician Assistant

## 2017-11-23 DIAGNOSIS — Z1231 Encounter for screening mammogram for malignant neoplasm of breast: Secondary | ICD-10-CM | POA: Diagnosis not present

## 2017-11-24 ENCOUNTER — Telehealth: Payer: Self-pay

## 2017-11-24 NOTE — Telephone Encounter (Signed)
Patient advised as below.  

## 2017-11-24 NOTE — Telephone Encounter (Signed)
-----   Message from Mar Daring, PA-C sent at 11/24/2017  9:34 AM EST ----- Normal mammogram. Repeat screening in one year.

## 2017-12-13 ENCOUNTER — Encounter (INDEPENDENT_AMBULATORY_CARE_PROVIDER_SITE_OTHER): Payer: Self-pay

## 2017-12-13 ENCOUNTER — Ambulatory Visit (INDEPENDENT_AMBULATORY_CARE_PROVIDER_SITE_OTHER): Payer: Self-pay | Admitting: Vascular Surgery

## 2018-01-10 ENCOUNTER — Other Ambulatory Visit: Payer: Self-pay | Admitting: Physician Assistant

## 2018-01-10 ENCOUNTER — Ambulatory Visit (INDEPENDENT_AMBULATORY_CARE_PROVIDER_SITE_OTHER): Payer: Medicare Other

## 2018-01-10 ENCOUNTER — Ambulatory Visit (INDEPENDENT_AMBULATORY_CARE_PROVIDER_SITE_OTHER): Payer: Medicare Other | Admitting: Vascular Surgery

## 2018-01-10 ENCOUNTER — Encounter (INDEPENDENT_AMBULATORY_CARE_PROVIDER_SITE_OTHER): Payer: Self-pay | Admitting: Vascular Surgery

## 2018-01-10 VITALS — BP 140/73 | HR 75 | Resp 16 | Wt 134.8 lb

## 2018-01-10 DIAGNOSIS — E119 Type 2 diabetes mellitus without complications: Secondary | ICD-10-CM

## 2018-01-10 DIAGNOSIS — G4700 Insomnia, unspecified: Secondary | ICD-10-CM

## 2018-01-10 DIAGNOSIS — I10 Essential (primary) hypertension: Secondary | ICD-10-CM | POA: Diagnosis not present

## 2018-01-10 DIAGNOSIS — I6523 Occlusion and stenosis of bilateral carotid arteries: Secondary | ICD-10-CM | POA: Diagnosis not present

## 2018-01-10 DIAGNOSIS — E7800 Pure hypercholesterolemia, unspecified: Secondary | ICD-10-CM

## 2018-01-10 MED ORDER — TEMAZEPAM 15 MG PO CAPS: 15.0000 mg | ORAL_CAPSULE | Freq: Every day | ORAL | 1 refills | Status: DC

## 2018-01-10 NOTE — Telephone Encounter (Signed)
Patient is requesting a refill on the following medication  temazepam (RESTORIL) 15 MG capsule   She uses [facility].

## 2018-01-10 NOTE — Telephone Encounter (Signed)
[facility]ed [facility]

## 2018-01-10 NOTE — Progress Notes (Signed)
MRN : [id number]  [name] is a 84 y.o. (1929-09-02) female who presents with chief complaint of No chief complaint on file. Marland Kitchen  History of Present Illness: The patient is seen for follow up evaluation of carotid stenosis. The carotid stenosis followed by ultrasound.   The patient denies amaurosis fugax. There is no recent history of TIA symptoms or focal motor deficits. There is no prior documented CVA.  The patient is taking enteric-coated aspirin 81 mg daily.  There is no history of migraine headaches. There is no history of seizures.  The patient has a history of coronary artery disease, no recent episodes of angina or shortness of breath. The patient denies PAD or claudication symptoms. There is a history of hyperlipidemia which is being treated with a statin.    Carotid Duplex done today shows <39% bilaterally widely patnet left CEA.  No change compared to last study   No outpatient medications have been marked as taking for the 01/10/18 encounter (Appointment) with Delana Meyer, Dolores Lory, MD.    Past Medical History:  Diagnosis Date  . Cancer ([place])    skin  . Diabetes mellitus without complication ([place])    type 2  . Hyperlipidemia   . Hypertension   . IBS (irritable bowel syndrome)     Past Surgical History:  Procedure Laterality Date  . BREAST BIOPSY Right    neg  . BREAST SURGERY     1980's  . CAROTID ARTERY ANGIOPLASTY Left 01/18/2015   Dr. Delana Meyer [facility]  . CHOLECYSTECTOMY  06/27/2012  . DECOMPRESSIVE LUMBAR LAMINECTOMY LEVEL 1  1950  . EYE SURGERY Right 2012   cataract extraction    Social History Social History   Tobacco Use  . Smoking status: Former Smoker    Packs/day: 0.50    Last attempt to quit: 11/15/1976    Years since quitting: 41.1  . Smokeless tobacco: Never Used  Substance Use Topics  . Alcohol use: No  . Drug use: No    Family History Family History  Problem Relation Age of Onset  . Ovarian cancer Mother   . Heart disease  Brother   . Heart attack Brother   . Gout Brother   . Alzheimer's disease Brother   . Dementia Brother   . Heart attack Brother   . Diabetes Daughter   . Breast cancer Neg Hx     Allergies  Allergen Reactions  . Cephalosporins   . Codeine   . Macrolides And Ketolides   . Penicillins   . Tramadol Nausea Only  . Sulfa Antibiotics Rash     REVIEW OF SYSTEMS (Negative unless checked)  Constitutional: [] Weight loss  [] Fever  [] Chills Cardiac: [] Chest pain   [] Chest pressure   [] Palpitations   [] Shortness of breath when laying flat   [] Shortness of breath with exertion. Vascular:  [] Pain in legs with walking   [] Pain in legs at rest  [] History of DVT   [] Phlebitis   [] Swelling in legs   [] Varicose veins   [] Non-healing ulcers Pulmonary:   [] Uses home oxygen   [] Productive cough   [] Hemoptysis   [] Wheeze  [] COPD   [] Asthma Neurologic:  [] Dizziness   [] Seizures   [] History of stroke   [] History of TIA  [] Aphasia   [] Vissual changes   [] Weakness or numbness in arm   [] Weakness or numbness in leg Musculoskeletal:   [] Joint swelling   [] Joint pain   [] Low back pain Hematologic:  [] Easy bruising  [] Easy bleeding   []   Hypercoagulable state   [] Anemic Gastrointestinal:  [] Diarrhea   [] Vomiting  [] Gastroesophageal reflux/heartburn   [] Difficulty swallowing. Genitourinary:  [] Chronic kidney disease   [] Difficult urination  [] Frequent urination   [] Blood in urine Skin:  [] Rashes   [] Ulcers  Psychological:  [] History of anxiety   []  History of major depression.  Physical Examination  There were no vitals filed for this visit. There is no height or weight on file to calculate BMI. Gen: WD/WN, NAD Head: /AT, No temporalis wasting.  Ear/Nose/Throat: Hearing grossly intact, nares w/o erythema or drainage Eyes: PER, EOMI, sclera nonicteric.  Neck: Supple, no large masses.   Pulmonary:  Good air movement, no audible wheezing bilaterally, no use of accessory muscles.  Cardiac: RRR, no  JVD Vascular:  Well healed left CEA incisional scar Vessel Right Left  Radial Palpable Palpable  Ulnar Palpable Palpable  Brachial Palpable Palpable  Carotid Palpable Palpable  Gastrointestinal: Non-distended. No guarding/no peritoneal signs.  Musculoskeletal: M/S 5/5 throughout.  No deformity or atrophy.  Neurologic: CN 2-12 intact. Symmetrical.  Speech is fluent. Motor exam as listed above. Psychiatric: Judgment intact, Mood & affect appropriate for pt's clinical situation. Dermatologic: No rashes or ulcers noted.  No changes consistent with cellulitis. Lymph : No lichenification or skin changes of chronic lymphedema.  CBC Lab Results  Component Value Date   WBC 7.0 04/20/2017   HGB 14.8 04/20/2017   HCT 42.9 04/20/2017   MCV 86 04/20/2017   PLT 161 04/20/2017    BMET    Component Value Date/Time   NA 142 04/20/2017 1110   NA 142 12/16/2014 0315   K 4.1 04/20/2017 1110   K 3.3 (L) 12/17/2014 1700   CL 101 04/20/2017 1110   CL 107 12/16/2014 0315   CO2 25 04/20/2017 1110   CO2 28 12/16/2014 0315   GLUCOSE 158 (H) 04/20/2017 1110   GLUCOSE 130 (H) 12/05/2016 1604   GLUCOSE 121 (H) 12/16/2014 0315   BUN 12 04/20/2017 1110   BUN 15 12/16/2014 0315   CREATININE 0.61 04/20/2017 1110   CREATININE 0.94 12/16/2014 0315   CALCIUM 9.8 04/20/2017 1110   CALCIUM 9.1 12/16/2014 0315   GFRNONAA 82 04/20/2017 1110   GFRNONAA >60 12/16/2014 0315   GFRNONAA >60 09/16/2013 1902   GFRAA 95 04/20/2017 1110   GFRAA >60 12/16/2014 0315   GFRAA >60 09/16/2013 1902   CrCl cannot be calculated (Patient's most recent lab result is older than the maximum 21 days allowed.).  COAG Lab Results  Component Value Date   INR 1.0 12/15/2014    Radiology No results found.   Assessment/Plan 1. Bilateral carotid artery stenosis Recommend:  Given the patient's asymptomatic subcritical stenosis no further invasive testing or surgery at this time.  Duplex ultrasound shows <39% stenosis  bilaterally.  Continue antiplatelet therapy as prescribed Continue management of CAD, HTN and Hyperlipidemia Healthy heart diet,  encouraged exercise at least 4 times per week  Follow up PRN  2. Essential hypertension Continue antihypertensive medications as already ordered, these medications have been reviewed and there are no changes at this time.   3. Diabetes mellitus without complication ([place]) Continue hypoglycemic medications as already ordered, these medications have been reviewed and there are no changes at this time.  Hgb A1C to be monitored as already arranged by primary service   4. Pure hypercholesterolemia Continue statin as ordered and reviewed, no changes at this time     Hortencia Pilar, MD  01/10/2018 8:26 AM

## 2018-01-24 DIAGNOSIS — L821 Other seborrheic keratosis: Secondary | ICD-10-CM | POA: Diagnosis not present

## 2018-01-24 DIAGNOSIS — Z08 Encounter for follow-up examination after completed treatment for malignant neoplasm: Secondary | ICD-10-CM | POA: Diagnosis not present

## 2018-01-24 DIAGNOSIS — L570 Actinic keratosis: Secondary | ICD-10-CM | POA: Diagnosis not present

## 2018-01-24 DIAGNOSIS — Z85828 Personal history of other malignant neoplasm of skin: Secondary | ICD-10-CM | POA: Diagnosis not present

## 2018-01-24 DIAGNOSIS — X32XXXA Exposure to sunlight, initial encounter: Secondary | ICD-10-CM | POA: Diagnosis not present

## 2018-04-12 ENCOUNTER — Telehealth: Payer: Self-pay | Admitting: Physician Assistant

## 2018-04-12 DIAGNOSIS — G4700 Insomnia, unspecified: Secondary | ICD-10-CM

## 2018-04-12 NOTE — Telephone Encounter (Signed)
Pt is requesting refill on temazepam (RESTORIL) 15 MG capsule.She uses [facility]

## 2018-04-13 MED ORDER — TEMAZEPAM 15 MG PO CAPS: 15.0000 mg | ORAL_CAPSULE | Freq: Every day | ORAL | 1 refills | Status: DC

## 2018-04-13 NOTE — Telephone Encounter (Signed)
Refilled

## 2018-04-18 ENCOUNTER — Other Ambulatory Visit: Payer: Self-pay | Admitting: Physician Assistant

## 2018-04-18 DIAGNOSIS — I10 Essential (primary) hypertension: Secondary | ICD-10-CM

## 2018-04-25 ENCOUNTER — Encounter: Payer: Self-pay | Admitting: Physician Assistant

## 2018-04-28 ENCOUNTER — Ambulatory Visit (INDEPENDENT_AMBULATORY_CARE_PROVIDER_SITE_OTHER): Payer: Medicare Other | Admitting: Physician Assistant

## 2018-04-28 ENCOUNTER — Encounter: Payer: Self-pay | Admitting: Physician Assistant

## 2018-04-28 ENCOUNTER — Other Ambulatory Visit: Payer: Self-pay

## 2018-04-28 ENCOUNTER — Ambulatory Visit (INDEPENDENT_AMBULATORY_CARE_PROVIDER_SITE_OTHER): Payer: Medicare Other

## 2018-04-28 VITALS — BP 122/66 | HR 91 | Temp 98.5°F | Resp 16 | Ht 62.0 in | Wt 136.0 lb

## 2018-04-28 VITALS — BP 122/66 | HR 91 | Temp 98.5°F | Ht 62.0 in | Wt 136.0 lb

## 2018-04-28 DIAGNOSIS — E7800 Pure hypercholesterolemia, unspecified: Secondary | ICD-10-CM | POA: Diagnosis not present

## 2018-04-28 DIAGNOSIS — L299 Pruritus, unspecified: Secondary | ICD-10-CM | POA: Diagnosis not present

## 2018-04-28 DIAGNOSIS — Z0000 Encounter for general adult medical examination without abnormal findings: Secondary | ICD-10-CM | POA: Diagnosis not present

## 2018-04-28 DIAGNOSIS — I6523 Occlusion and stenosis of bilateral carotid arteries: Secondary | ICD-10-CM

## 2018-04-28 DIAGNOSIS — R350 Frequency of micturition: Secondary | ICD-10-CM

## 2018-04-28 DIAGNOSIS — E119 Type 2 diabetes mellitus without complications: Secondary | ICD-10-CM

## 2018-04-28 DIAGNOSIS — I10 Essential (primary) hypertension: Secondary | ICD-10-CM

## 2018-04-28 MED ORDER — ATORVASTATIN CALCIUM 40 MG PO TABS: 40.0000 mg | ORAL_TABLET | Freq: Every day | ORAL | 3 refills | Status: DC

## 2018-04-28 MED ORDER — TRIAMCINOLONE ACETONIDE 0.1 % EX CREA: 1.0000 "application " | TOPICAL_CREAM | Freq: Two times a day (BID) | CUTANEOUS | 0 refills | Status: DC

## 2018-04-28 MED ORDER — AMLODIPINE BESYLATE 10 MG PO TABS: 10.0000 mg | ORAL_TABLET | Freq: Every day | ORAL | 3 refills | Status: DC

## 2018-04-28 MED ORDER — OXYBUTYNIN CHLORIDE 5 MG PO TABS: 5.0000 mg | ORAL_TABLET | Freq: Two times a day (BID) | ORAL | 3 refills | Status: DC

## 2018-04-28 MED ORDER — METFORMIN HCL ER 500 MG PO TB24: 1000.0000 mg | ORAL_TABLET | Freq: Every day | ORAL | 3 refills | Status: DC

## 2018-04-28 NOTE — Progress Notes (Signed)
Patient: [name], Female    DOB: 09-Oct-1929, 84 y.o.   MRN: [id number] Visit Date: 04/28/2018  Today's Provider: Mar Daring, PA-C   Chief Complaint  Patient presents with  . Medicare Wellness   Subjective:     Complete Physical [name] is a 84 y.o. female. She feels well. She reports exercising active with daily avtivities. She reports she is sleeping well.  04/20/17 CPE 11/23/17 Mammogram-BI-RADS-1; reports she wants to stop mammogram screenings at this time. -----------------------------------------------------------   Review of Systems  Constitutional: Negative.   HENT: Negative.   Eyes: Positive for photophobia, discharge and itching.       Patient has blepharitis  Respiratory: Negative.   Cardiovascular: Negative.   Gastrointestinal: Negative.   Endocrine: Negative.   Genitourinary: Negative.   Musculoskeletal: Negative.   Skin: Negative.   Allergic/Immunologic: Negative.   Neurological: Negative.   Hematological: Negative.   Psychiatric/Behavioral: Negative.     Social History   Socioeconomic History  . Marital status: Widowed    Spouse name: Not on file  . Number of children: 2  . Years of education: Not on file  . Highest education level: 12th grade  Occupational History  . Occupation: retired  Scientific laboratory technician  . Financial resource strain: Not hard at all  . Food insecurity:    Worry: Never true    Inability: Never true  . Transportation needs:    Medical: No    Non-medical: No  Tobacco Use  . Smoking status: Former Smoker    Packs/day: 0.50    Last attempt to quit: 11/15/1976    Years since quitting: 41.4  . Smokeless tobacco: Never Used  Substance and Sexual Activity  . Alcohol use: No  . Drug use: No  . Sexual activity: Not on file  Lifestyle  . Physical activity:    Days per week: Not on file    Minutes per session: Not on file  . Stress: Not at all  Relationships  . Social connections:    Talks on  phone: Not on file    Gets together: Not on file    Attends religious service: Not on file    Active member of club or organization: Not on file    Attends meetings of clubs or organizations: Not on file    Relationship status: Not on file  . Intimate partner violence:    Fear of current or ex partner: Not on file    Emotionally abused: Not on file    Physically abused: Not on file    Forced sexual activity: Not on file  Other Topics Concern  . Not on file  Social History Narrative  . Not on file    Past Medical History:  Diagnosis Date  . Cancer ([place])    skin  . Diabetes mellitus without complication ([place])    type 2  . Hyperlipidemia   . Hypertension   . IBS (irritable bowel syndrome)      Patient Active Problem List   Diagnosis Date Noted  . Irritable bowel syndrome with diarrhea 04/20/2017  . Diabetes mellitus without complication ([place]) 08/67/6195  . Allergic rhinitis 08/05/2015  . Aortic heart valve narrowing 08/05/2015  . Breast cyst 08/05/2015  . Carotid artery narrowing 08/05/2015  . Colon polyp 08/05/2015  . Cerebral vascular accident ([place]) 08/05/2015  . DD (diverticular disease) 08/05/2015  . Elevated CK 08/05/2015  . Acid reflux 08/05/2015  . Headache disorder 08/05/2015  .  Calcium blood increased 08/05/2015  . HLD (hyperlipidemia) 08/05/2015  . BP (high blood pressure) 08/05/2015  . Cannot sleep 08/05/2015  . LBP (low back pain) 08/05/2015  . OP (osteoporosis) 08/05/2015  . Adiposity 08/05/2015  . Abnormal Pap smear of vagina 08/05/2015  . Hemorrhage, postmenopausal 08/05/2015  . Superficial thrombophlebitis 08/05/2015  . FOM (frequency of micturition) 08/05/2015  . Fatigue 08/05/2015    Past Surgical History:  Procedure Laterality Date  . BREAST BIOPSY Right    neg  . BREAST SURGERY     1980's  . CAROTID ARTERY ANGIOPLASTY Left 01/18/2015   Dr. Delana Meyer [facility]  . CHOLECYSTECTOMY  06/27/2012  . DECOMPRESSIVE LUMBAR LAMINECTOMY LEVEL 1  1950  .  EYE SURGERY Right 2012   cataract extraction    Her family history includes Alzheimer's disease in her brother; Dementia in her brother; Diabetes in her daughter; Gout in her brother; Heart attack in her brother and brother; Heart disease in her brother; Ovarian cancer in her mother. There is no history of Breast cancer.      Current Outpatient Medications:  .  amLODipine (NORVASC) 10 MG tablet, Take 1 tablet (10 mg total) by mouth daily., Disp: 90 tablet, Rfl: 3 .  aspirin 325 MG EC tablet, Take 325 mg by mouth daily. , Disp: , Rfl:  .  aspirin EC 81 MG tablet, Take 81 mg by mouth daily. , Disp: , Rfl:  .  atorvastatin (LIPITOR) 40 MG tablet, Take 1 tablet (40 mg total) by mouth daily at 6 PM., Disp: 90 tablet, Rfl: 3 .  BIOTIN PO, Take 1 tablet by mouth daily. , Disp: , Rfl:  .  cholecalciferol (VITAMIN D) 1000 UNITS tablet, Take 1,000 Units by mouth daily. , Disp: , Rfl:  .  erythromycin ophthalmic ointment, Place 1 application into both eyes once. Reported on 03/25/2016, Disp: , Rfl:  .  glucose blood (ONE TOUCH ULTRA TEST) test strip, Check blood glucose once daily, Disp: 100 each, Rfl: 12 .  hydrochlorothiazide (HYDRODIURIL) 12.5 MG tablet, TAKE 1 TABLET BY MOUTH ONCE DAILY, Disp: 90 tablet, Rfl: 3 .  metFORMIN (GLUCOPHAGE) 500 MG tablet, TAKE 1 TABLET BY MOUTH TWICE DAILY WITH MEALS, Disp: 180 tablet, Rfl: 1 .  OMEGA-3 FATTY ACIDS PO, Take 1 capsule by mouth daily. , Disp: , Rfl:  .  oxybutynin (DITROPAN) 5 MG tablet, Take 1 tablet (5 mg total) by mouth 2 (two) times daily., Disp: 180 tablet, Rfl: 3 .  polyethylene glycol (MIRALAX / GLYCOLAX) packet, Take 17 g by mouth daily., Disp: , Rfl:  .  temazepam (RESTORIL) 15 MG capsule, Take 1 capsule (15 mg total) by mouth at bedtime., Disp: 90 capsule, Rfl: 1  Patient Care Team: Mar Daring, PA-C as PCP - General (Family Medicine) Eulogio Bear, MD as Consulting Physician (Ophthalmology) Ubaldo Glassing Javier Docker, MD as Consulting  Physician (Cardiology)     Objective:   Vitals: BP 122/66 (BP Location: Right Arm, Patient Position: Sitting, Cuff Size: Normal)   Pulse 91   Temp 98.5 F (36.9 C) (Oral)   Resp 16   Ht 5\' 2"  (1.575 m)   Wt 136 lb (61.7 kg)   BMI 24.87 kg/m   Physical Exam  Activities of Daily Living In your present state of health, do you have any difficulty performing the following activities: 04/28/2018  Hearing? N  Vision? N  Difficulty concentrating or making decisions? N  Walking or climbing stairs? N  Dressing or bathing? N  Doing  errands, shopping? N  Preparing Food and eating ? N  Using the Toilet? N  In the past six months, have you accidently leaked urine? N  Do you have problems with loss of bowel control? N  Managing your Medications? N  Managing your Finances? N  Housekeeping or managing your Housekeeping? N  Some recent data might be hidden    Fall Risk Assessment Fall Risk  04/28/2018 04/20/2017 04/15/2016  Falls in the past year? No No No     Depression Screen PHQ 2/9 Scores 04/28/2018 04/20/2017 04/20/2017 04/15/2016  PHQ - 2 Score 0 0 0 0  PHQ- 9 Score - 3 - -    Cognitive Testing - 6-CIT  Correct? Score   What year is it? yes 0 0 or 4  What month is it? yes 0 0 or 3  Memorize:    Pia Mau,  44,  [address]  [place],      What time is it? (within 1 hour) yes 0 0 or 3  Count backwards from 20 yes 0 0, 2, or 4  Name the months of the year yes 0 0, 2, or 4  Repeat name & address above no 2 0, 2, 4, 6, 8, or 10       TOTAL SCORE  2/28   Interpretation:  Normal  Normal (0-7) Abnormal (8-28)       Assessment & Plan:    Annual Physical Reviewed patient's Family Medical History Reviewed and updated list of patient's medical providers Assessment of cognitive impairment was done Assessed patient's functional ability Established a written schedule for health screening Mamm[place] Completed and Reviewed  Exercise Activities and Dietary  recommendations Goals    . DIET - EAT MORE FRUITS AND VEGETABLES     Recommend adding in 2 servings each of fruits and vegetables in daily diet.        Immunization History  Administered Date(s) Administered  . Influenza, High Dose Seasonal PF 09/05/2015  . Influenza,inj,Quad PF,6+ Mos 08/14/2013  . Influenza-Unspecified 08/31/2017  . Pneumococcal Conjugate-13 04/02/2014  . Pneumococcal Polysaccharide-23 12/08/2004  . Zoster 03/03/2010    Health Maintenance  Topic Date Due  . FOOT EXAM  01/20/1941  . OPHTHALMOLOGY EXAM  09/16/2017  . HEMOGLOBIN A1C  10/20/2017  . URINE MICROALBUMIN  04/20/2018  . TETANUS/TDAP  11/16/2026 (Originally 01/20/1950)  . INFLUENZA VACCINE  06/16/2018  . DEXA SCAN  Completed  . PNA vac Low Risk Adult  Completed     Discussed health benefits of physical activity, and encouraged her to engage in regular exercise appropriate for her age and condition.    1. Type 2 diabetes mellitus without complication, without long-term current use of insulin ([place]) Having diarrhea with metformin. Will change to XR metformin as below. She is to call or return if diarrhea continues despite change. Will check labs as below and f/u pending results. - CBC with Differential/Platelet - Comprehensive metabolic panel - Hemoglobin A1c - Lipid Panel With LDL/HDL Ratio - metFORMIN (GLUCOPHAGE-XR) 500 MG 24 hr tablet; Take 2 tablets (1,000 mg total) by mouth daily with breakfast.  Dispense: 180 tablet; Refill: 3  2. Essential hypertension Stable today. Continue amlodipine 10mg  and HCTZ 12.5mg . Will check labs as below and f/u pending results. - CBC with Differential/Platelet - Comprehensive metabolic panel - Hemoglobin A1c - Lipid Panel With LDL/HDL Ratio - amLODipine (NORVASC) 10 MG tablet; Take 1 tablet (10 mg total) by mouth daily.  Dispense: 90 tablet; Refill:  3  3. Pure hypercholesterolemia Stable. Continue atorvastatin 40mg . Will check labs as below and f/u pending  results. - CBC with Differential/Platelet - Comprehensive metabolic panel - Hemoglobin A1c - Lipid Panel With LDL/HDL Ratio - atorvastatin (LIPITOR) 40 MG tablet; Take 1 tablet (40 mg total) by mouth daily at 6 PM.  Dispense: 90 tablet; Refill: 3  4. FOM (frequency of micturition) Stable and unchanged. Diagnosis pulled for medication refill. Continue current medical treatment plan. - oxybutynin (DITROPAN) 5 MG tablet; Take 1 tablet (5 mg total) by mouth 2 (two) times daily.  Dispense: 180 tablet; Refill: 3  5. Itching Itching along neck without known cause. Will try triamcinolone cream as below to see if this will help. She is to call if no improvements.  - Comprehensive metabolic panel - triamcinolone cream (KENALOG) 0.1 %; Apply 1 application topically 2 (two) times daily.  Dispense: 30 g; Refill: 0  ------------------------------------------------------------------------------------------------------------    Mar Daring, PA-C  [facility]

## 2018-04-28 NOTE — Patient Instructions (Signed)
[name] , Thank you for taking time to come for your Medicare Wellness Visit. I appreciate your ongoing commitment to your health goals. Please review the following plan we discussed and let me know if I can assist you in the future.   Screening recommendations/referrals: Colonoscopy: Up to date Mammogram: Up to date Bone Density: Up to date Recommended yearly ophthalmology/optometry visit for glaucoma screening and checkup Recommended yearly dental visit for hygiene and checkup  Vaccinations: Influenza vaccine: Up to date Pneumococcal vaccine: Up to date Tdap vaccine: Pt declines today.  Shingles vaccine: Pt declines today.     Advanced directives: Advance directive discussed with you today. Even though you declined this today please call our office should you change your mind and we can give you the proper paperwork for you to fill out.  Conditions/risks identified: Obesity- recommend adding in 2 servings each of fruits and vegetables in daily diet.   Next appointment: 3:00 PM today with [name].    Preventive Care 25 Years and Older, Female Preventive care refers to lifestyle choices and visits with your health care provider that can promote health and wellness. What does preventive care include?  A yearly physical exam. This is also called an annual well check.  Dental exams once or twice a year.  Routine eye exams. Ask your health care provider how often you should have your eyes checked.  Personal lifestyle choices, including:  Daily care of your teeth and gums.  Regular physical activity.  Eating a healthy diet.  Avoiding tobacco and drug use.  Limiting alcohol use.  Practicing safe sex.  Taking low-dose aspirin every day.  Taking vitamin and mineral supplements as recommended by your health care provider. What happens during an annual well check? The services and screenings done by your health care provider during your annual well check will  depend on your age, overall health, lifestyle risk factors, and family history of disease. Counseling  Your health care provider may ask you questions about your:  Alcohol use.  Tobacco use.  Drug use.  Emotional well-being.  Home and relationship well-being.  Sexual activity.  Eating habits.  History of falls.  Memory and ability to understand (cognition).  Work and work Statistician.  Reproductive health. Screening  You may have the following tests or measurements:  Height, weight, and BMI.  Blood pressure.  Lipid and cholesterol levels. These may be checked every 5 years, or more frequently if you are over 52 years old.  Skin check.  Lung cancer screening. You may have this screening every year starting at age 81 if you have a 30-pack-year history of smoking and currently smoke or have quit within the past 15 years.  Fecal occult blood test (FOBT) of the stool. You may have this test every year starting at age 15.  Flexible sigmoidoscopy or colonoscopy. You may have a sigmoidoscopy every 5 years or a colonoscopy every 10 years starting at age 93.  Hepatitis C blood test.  Hepatitis B blood test.  Sexually transmitted disease (STD) testing.  Diabetes screening. This is done by checking your blood sugar (glucose) after you have not eaten for a while (fasting). You may have this done every 1-3 years.  Bone density scan. This is done to screen for osteoporosis. You may have this done starting at age 80.  Mammogram. This may be done every 1-2 years. Talk to your health care provider about how often you should have regular mammograms. Talk with your health care provider  about your test results, treatment options, and if necessary, the need for more tests. Vaccines  Your health care provider may recommend certain vaccines, such as:  Influenza vaccine. This is recommended every year.  Tetanus, diphtheria, and acellular pertussis (Tdap, Td) vaccine. You may need a  Td booster every 10 years.  Zoster vaccine. You may need this after age 3.  Pneumococcal 13-valent conjugate (PCV13) vaccine. One dose is recommended after age 41.  Pneumococcal polysaccharide (PPSV23) vaccine. One dose is recommended after age 63. Talk to your health care provider about which screenings and vaccines you need and how often you need them. This information is not intended to replace advice given to you by your health care provider. Make sure you discuss any questions you have with your health care provider. Document Released: 11/29/2015 Document Revised: 07/22/2016 Document Reviewed: 09/03/2015 Elsevier Interactive Patient Education  2017 [place] Prevention in the Home Falls can cause injuries. They can happen to people of all ages. There are many things you can do to make your home safe and to help prevent falls. What can I do on the outside of my home?  Regularly fix the edges of walkways and driveways and fix any cracks.  Remove anything that might make you trip as you walk through a door, such as a raised step or threshold.  Trim any bushes or trees on the path to your home.  Use bright outdoor lighting.  Clear any walking paths of anything that might make someone trip, such as rocks or tools.  Regularly check to see if handrails are loose or broken. Make sure that both sides of any steps have handrails.  Any raised decks and porches should have guardrails on the edges.  Have any leaves, snow, or ice cleared regularly.  Use sand or salt on walking paths during winter.  Clean up any spills in your garage right away. This includes oil or grease spills. What can I do in the bathroom?  Use night lights.  Install grab bars by the toilet and in the tub and shower. Do not use towel bars as grab bars.  Use non-skid mats or decals in the tub or shower.  If you need to sit down in the shower, use a plastic, non-slip stool.  Keep the floor dry. Clean  up any water that spills on the floor as soon as it happens.  Remove soap buildup in the tub or shower regularly.  Attach bath mats securely with double-sided non-slip rug tape.  Do not have throw rugs and other things on the floor that can make you trip. What can I do in the bedroom?  Use night lights.  Make sure that you have a light by your bed that is easy to reach.  Do not use any sheets or blankets that are too big for your bed. They should not hang down onto the floor.  Have a firm chair that has side arms. You can use this for support while you get dressed.  Do not have throw rugs and other things on the floor that can make you trip. What can I do in the kitchen?  Clean up any spills right away.  Avoid walking on wet floors.  Keep items that you use a lot in easy-to-reach places.  If you need to reach something above you, use a strong step stool that has a grab bar.  Keep electrical cords out of the way.  Do not use floor polish or  wax that makes floors slippery. If you must use wax, use non-skid floor wax.  Do not have throw rugs and other things on the floor that can make you trip. What can I do with my stairs?  Do not leave any items on the stairs.  Make sure that there are handrails on both sides of the stairs and use them. Fix handrails that are broken or loose. Make sure that handrails are as long as the stairways.  Check any carpeting to make sure that it is firmly attached to the stairs. Fix any carpet that is loose or worn.  Avoid having throw rugs at the top or bottom of the stairs. If you do have throw rugs, attach them to the floor with carpet tape.  Make sure that you have a light switch at the top of the stairs and the bottom of the stairs. If you do not have them, ask someone to add them for you. What else can I do to help prevent falls?  Wear shoes that:  Do not have high heels.  Have rubber bottoms.  Are comfortable and fit you well.  Are  closed at the toe. Do not wear sandals.  If you use a stepladder:  Make sure that it is fully opened. Do not climb a closed stepladder.  Make sure that both sides of the stepladder are locked into place.  Ask someone to hold it for you, if possible.  Clearly mark and make sure that you can see:  Any grab bars or handrails.  First and last steps.  Where the edge of each step is.  Use tools that help you move around (mobility aids) if they are needed. These include:  Canes.  Walkers.  Scooters.  Crutches.  Turn on the lights when you go into a dark area. Replace any light bulbs as soon as they burn out.  Set up your furniture so you have a clear path. Avoid moving your furniture around.  If any of your floors are uneven, fix them.  If there are any pets around you, be aware of where they are.  Review your medicines with your doctor. Some medicines can make you feel dizzy. This can increase your chance of falling. Ask your doctor what other things that you can do to help prevent falls. This information is not intended to replace advice given to you by your health care provider. Make sure you discuss any questions you have with your health care provider. Document Released: 08/29/2009 Document Revised: 04/09/2016 Document Reviewed: 12/07/2014 Elsevier Interactive Patient Education  2017 [employer].

## 2018-04-28 NOTE — Patient Instructions (Signed)
Health Maintenance for Postmenopausal Women Menopause is a normal process in which your reproductive ability comes to an end. This process happens gradually over a span of months to years, usually between the ages of 22 and 9. Menopause is complete when you have missed 12 consecutive menstrual periods. It is important to talk with your health care provider about some of the most common conditions that affect postmenopausal women, such as heart disease, cancer, and bone loss (osteoporosis). Adopting a healthy lifestyle and getting preventive care can help to promote your health and wellness. Those actions can also lower your chances of developing some of these common conditions. What should I know about menopause? During menopause, you may experience a number of symptoms, such as:  Moderate-to-severe hot flashes.  Night sweats.  Decrease in sex drive.  Mood swings.  Headaches.  Tiredness.  Irritability.  Memory problems.  Insomnia.  Choosing to treat or not to treat menopausal changes is an individual decision that you make with your health care provider. What should I know about hormone replacement therapy and supplements? Hormone therapy products are effective for treating symptoms that are associated with menopause, such as hot flashes and night sweats. Hormone replacement carries certain risks, especially as you become older. If you are thinking about using estrogen or estrogen with progestin treatments, discuss the benefits and risks with your health care provider. What should I know about heart disease and stroke? Heart disease, heart attack, and stroke become more likely as you age. This may be due, in part, to the hormonal changes that your body experiences during menopause. These can affect how your body processes dietary fats, triglycerides, and cholesterol. Heart attack and stroke are both medical emergencies. There are many things that you can do to help prevent heart disease  and stroke:  Have your blood pressure checked at least every 1-2 years. High blood pressure causes heart disease and increases the risk of stroke.  If you are 53-22 years old, ask your health care provider if you should take aspirin to prevent a heart attack or a stroke.  Do not use any tobacco products, including cigarettes, chewing tobacco, or electronic cigarettes. If you need help quitting, ask your health care provider.  It is important to eat a healthy diet and maintain a healthy weight. ? Be sure to include plenty of vegetables, fruits, low-fat dairy products, and lean protein. ? Avoid eating foods that are high in solid fats, added sugars, or salt (sodium).  Get regular exercise. This is one of the most important things that you can do for your health. ? Try to exercise for at least 150 minutes each week. The type of exercise that you do should increase your heart rate and make you sweat. This is known as moderate-intensity exercise. ? Try to do strengthening exercises at least twice each week. Do these in addition to the moderate-intensity exercise.  Know your numbers.Ask your health care provider to check your cholesterol and your blood glucose. Continue to have your blood tested as directed by your health care provider.  What should I know about cancer screening? There are several types of cancer. Take the following steps to reduce your risk and to catch any cancer development as early as possible. Breast Cancer  Practice breast self-awareness. ? This means understanding how your breasts normally appear and feel. ? It also means doing regular breast self-exams. Let your health care provider know about any changes, no matter how small.  If you are 40  or older, have a clinician do a breast exam (clinical breast exam or CBE) every year. Depending on your age, family history, and medical history, it may be recommended that you also have a yearly breast X-ray (mammogram).  If you  have a family history of breast cancer, talk with your health care provider about genetic screening.  If you are at high risk for breast cancer, talk with your health care provider about having an MRI and a mammogram every year.  Breast cancer (BRCA) gene test is recommended for women who have family members with BRCA-related cancers. Results of the assessment will determine the need for genetic counseling and BRCA1 and for BRCA2 testing. BRCA-related cancers include these types: ? Breast. This occurs in males or females. ? Ovarian. ? Tubal. This may also be called fallopian tube cancer. ? Cancer of the abdominal or pelvic lining (peritoneal cancer). ? Prostate. ? Pancreatic.  Cervical, Uterine, and Ovarian Cancer Your health care provider may recommend that you be screened regularly for cancer of the pelvic organs. These include your ovaries, uterus, and vagina. This screening involves a pelvic exam, which includes checking for microscopic changes to the surface of your cervix (Pap test).  For women ages 21-65, health care providers may recommend a pelvic exam and a Pap test every three years. For women ages 79-65, they may recommend the Pap test and pelvic exam, combined with testing for human papilloma virus (HPV), every five years. Some types of HPV increase your risk of cervical cancer. Testing for HPV may also be done on women of any age who have unclear Pap test results.  Other health care providers may not recommend any screening for nonpregnant women who are considered low risk for pelvic cancer and have no symptoms. Ask your health care provider if a screening pelvic exam is right for you.  If you have had past treatment for cervical cancer or a condition that could lead to cancer, you need Pap tests and screening for cancer for at least 20 years after your treatment. If Pap tests have been discontinued for you, your risk factors (such as having a new sexual partner) need to be  reassessed to determine if you should start having screenings again. Some women have medical problems that increase the chance of getting cervical cancer. In these cases, your health care provider may recommend that you have screening and Pap tests more often.  If you have a family history of uterine cancer or ovarian cancer, talk with your health care provider about genetic screening.  If you have vaginal bleeding after reaching menopause, tell your health care provider.  There are currently no reliable tests available to screen for ovarian cancer.  Lung Cancer Lung cancer screening is recommended for adults 69-62 years old who are at high risk for lung cancer because of a history of smoking. A yearly low-dose CT scan of the lungs is recommended if you:  Currently smoke.  Have a history of at least 30 pack-years of smoking and you currently smoke or have quit within the past 15 years. A pack-year is smoking an average of one pack of cigarettes per day for one year.  Yearly screening should:  Continue until it has been 15 years since you quit.  Stop if you develop a health problem that would prevent you from having lung cancer treatment.  Colorectal Cancer  This type of cancer can be detected and can often be prevented.  Routine colorectal cancer screening usually begins at  age 42 and continues through age 45.  If you have risk factors for colon cancer, your health care provider may recommend that you be screened at an earlier age.  If you have a family history of colorectal cancer, talk with your health care provider about genetic screening.  Your health care provider may also recommend using home test kits to check for hidden blood in your stool.  A small camera at the end of a tube can be used to examine your colon directly (sigmoidoscopy or colonoscopy). This is done to check for the earliest forms of colorectal cancer.  Direct examination of the colon should be repeated every  5-10 years until age 71. However, if early forms of precancerous polyps or small growths are found or if you have a family history or genetic risk for colorectal cancer, you may need to be screened more often.  Skin Cancer  Check your skin from head to toe regularly.  Monitor any moles. Be sure to tell your health care provider: ? About any new moles or changes in moles, especially if there is a change in a mole's shape or color. ? If you have a mole that is larger than the size of a pencil eraser.  If any of your family members has a history of skin cancer, especially at a young age, talk with your health care provider about genetic screening.  Always use sunscreen. Apply sunscreen liberally and repeatedly throughout the day.  Whenever you are outside, protect yourself by wearing long sleeves, pants, a wide-brimmed hat, and sunglasses.  What should I know about osteoporosis? Osteoporosis is a condition in which bone destruction happens more quickly than new bone creation. After menopause, you may be at an increased risk for osteoporosis. To help prevent osteoporosis or the bone fractures that can happen because of osteoporosis, the following is recommended:  If you are 46-71 years old, get at least 1,000 mg of calcium and at least 600 mg of vitamin D per day.  If you are older than age 55 but younger than age 65, get at least 1,200 mg of calcium and at least 600 mg of vitamin D per day.  If you are older than age 54, get at least 1,200 mg of calcium and at least 800 mg of vitamin D per day.  Smoking and excessive alcohol intake increase the risk of osteoporosis. Eat foods that are rich in calcium and vitamin D, and do weight-bearing exercises several times each week as directed by your health care provider. What should I know about how menopause affects my mental health? Depression may occur at any age, but it is more common as you become older. Common symptoms of depression  include:  Low or sad mood.  Changes in sleep patterns.  Changes in appetite or eating patterns.  Feeling an overall lack of motivation or enjoyment of activities that you previously enjoyed.  Frequent crying spells.  Talk with your health care provider if you think that you are experiencing depression. What should I know about immunizations? It is important that you get and maintain your immunizations. These include:  Tetanus, diphtheria, and pertussis (Tdap) booster vaccine.  Influenza every year before the flu season begins.  Pneumonia vaccine.  Shingles vaccine.  Your health care provider may also recommend other immunizations. This information is not intended to replace advice given to you by your health care provider. Make sure you discuss any questions you have with your health care provider. Document Released: 12/25/2005  Document Revised: 05/22/2016 Document Reviewed: 08/06/2015 Elsevier Interactive Patient Education  2018 Elsevier Inc.  

## 2018-04-28 NOTE — Progress Notes (Signed)
Subjective:   [name] is a 84 y.o. female who presents for Medicare Annual (Subsequent) preventive examination.  Review of Systems:  N/A  Cardiac Risk Factors include: advanced age (>55men, >31 women);diabetes mellitus;dyslipidemia;hypertension;obesity (BMI >30kg/m2)     Objective:     Vitals: BP 122/66 (BP Location: Right Arm)   Pulse 91   Temp 98.5 F (36.9 C) (Oral)   Ht 5\' 2"  (1.575 m)   Wt 136 lb (61.7 kg)   BMI 24.87 kg/m   Body mass index is 24.87 kg/m.  Advanced Directives 04/28/2018 04/20/2017 11/02/2016 04/15/2016 08/05/2015  Does Patient Have a Medical Advance Directive? No Yes Yes Yes Yes  Type of Advance Directive - Living will;Healthcare Power of [place];Living will [place];Living will Living will  Copy of [place] in Chart? - No - copy requested - - -  Would patient like information on creating a medical advance directive? No - Patient declined - - - -    Tobacco Social History   Tobacco Use  Smoking Status Former Smoker  . Packs/day: 0.50  . Last attempt to quit: 11/15/1976  . Years since quitting: 41.4  Smokeless Tobacco Never Used     Counseling given: Not Answered   Clinical Intake:  Pre-visit preparation completed: Yes  Pain : No/denies pain Pain Score: 0-No pain     Nutritional Status: BMI of 19-24  Normal Nutritional Risks: None Diabetes: Yes(type 2) CBG done?: No Did pt. bring in CBG monitor from home?: No  How often do you need to have someone help you when you read instructions, pamphlets, or other written materials from your doctor or pharmacy?: 1 - Never  Interpreter Needed?: No  Information entered by :: [facility], LPN  Past Medical History:  Diagnosis Date  . Cancer ([place])    skin  . Diabetes mellitus without complication ([place])    type 2  . Hyperlipidemia   . Hypertension   . IBS (irritable bowel syndrome)    Past Surgical History:    Procedure Laterality Date  . BREAST BIOPSY Right    neg  . BREAST SURGERY     1980's  . CAROTID ARTERY ANGIOPLASTY Left 01/18/2015   Dr. Delana Meyer [facility]  . CHOLECYSTECTOMY  06/27/2012  . DECOMPRESSIVE LUMBAR LAMINECTOMY LEVEL 1  1950  . EYE SURGERY Right 2012   cataract extraction   Family History  Problem Relation Age of Onset  . Ovarian cancer Mother   . Heart disease Brother   . Heart attack Brother   . Gout Brother   . Alzheimer's disease Brother   . Dementia Brother   . Heart attack Brother   . Diabetes Daughter   . Breast cancer Neg Hx    Social History   Socioeconomic History  . Marital status: Widowed    Spouse name: Not on file  . Number of children: 2  . Years of education: Not on file  . Highest education level: 12th grade  Occupational History  . Occupation: retired  Scientific laboratory technician  . Financial resource strain: Not hard at all  . Food insecurity:    Worry: Never true    Inability: Never true  . Transportation needs:    Medical: No    Non-medical: No  Tobacco Use  . Smoking status: Former Smoker    Packs/day: 0.50    Last attempt to quit: 11/15/1976    Years since quitting: 41.4  . Smokeless tobacco: Never Used  Substance and Sexual Activity  . Alcohol use: No  . Drug use: No  . Sexual activity: Not on file  Lifestyle  . Physical activity:    Days per week: Not on file    Minutes per session: Not on file  . Stress: Not at all  Relationships  . Social connections:    Talks on phone: Not on file    Gets together: Not on file    Attends religious service: Not on file    Active member of club or organization: Not on file    Attends meetings of clubs or organizations: Not on file    Relationship status: Not on file  Other Topics Concern  . Not on file  Social History Narrative  . Not on file    Outpatient Encounter Medications as of 04/28/2018  Medication Sig  . amLODipine (NORVASC) 10 MG tablet Take 1 tablet (10 mg total) by mouth daily.  Marland Kitchen  aspirin EC 81 MG tablet Take 81 mg by mouth daily.   Marland Kitchen atorvastatin (LIPITOR) 40 MG tablet Take 1 tablet (40 mg total) by mouth daily at 6 PM.  . cholecalciferol (VITAMIN D) 1000 UNITS tablet Take 1,000 Units by mouth daily.   Marland Kitchen erythromycin ophthalmic ointment Place 1 application into both eyes once. Reported on 03/25/2016  . glucose blood (ONE TOUCH ULTRA TEST) test strip Check blood glucose once daily  . hydrochlorothiazide (HYDRODIURIL) 12.5 MG tablet TAKE 1 TABLET BY MOUTH ONCE DAILY  . metFORMIN (GLUCOPHAGE) 500 MG tablet TAKE 1 TABLET BY MOUTH TWICE DAILY WITH MEALS  . OMEGA-3 FATTY ACIDS PO Take 1 capsule by mouth daily.   Marland Kitchen oxybutynin (DITROPAN) 5 MG tablet Take 1 tablet (5 mg total) by mouth 2 (two) times daily.  . temazepam (RESTORIL) 15 MG capsule Take 1 capsule (15 mg total) by mouth at bedtime.  Marland Kitchen aspirin 325 MG EC tablet Take 325 mg by mouth daily.   Marland Kitchen BIOTIN PO Take 1 tablet by mouth daily.   . polyethylene glycol (MIRALAX / GLYCOLAX) packet Take 17 g by mouth daily.   No facility-administered encounter medications on file as of 04/28/2018.     Activities of Daily Living In your present state of health, do you have any difficulty performing the following activities: 04/28/2018  Hearing? N  Vision? N  Difficulty concentrating or making decisions? N  Walking or climbing stairs? N  Dressing or bathing? N  Doing errands, shopping? N  Preparing Food and eating ? N  Using the Toilet? N  In the past six months, have you accidently leaked urine? N  Do you have problems with loss of bowel control? N  Managing your Medications? N  Managing your Finances? N  Housekeeping or managing your Housekeeping? N  Some recent data might be hidden    Patient Care Team: Rubye Beach as PCP - General (Family Medicine) Eulogio Bear, MD as Consulting Physician (Ophthalmology) Ubaldo Glassing Javier Docker, MD as Consulting Physician (Cardiology)    Assessment:   This is a routine  wellness examination for [name].  Exercise Activities and Dietary recommendations Current Exercise Habits: The patient does not participate in regular exercise at present, Exercise limited by: None identified  Goals    . DIET - EAT MORE FRUITS AND VEGETABLES     Recommend adding in 2 servings each of fruits and vegetables in daily diet.        Fall Risk Fall Risk  04/20/2017 04/15/2016  Falls in the  past year? No No   Is the patient's home free of loose throw rugs in walkways, pet beds, electrical cords, etc?   yes      Grab bars in the bathroom? yes      Handrails on the stairs?   no      Adequate lighting?   yes  Timed Get Up and Go performed: N/A  Depression Screen PHQ 2/9 Scores 04/28/2018 04/20/2017 04/20/2017 04/15/2016  PHQ - 2 Score 0 0 0 0  PHQ- 9 Score - 3 - -     Cognitive Function: Pt declined screening today.      6CIT Screen 04/20/2017  What Year? 0 points  What month? 0 points  What time? 0 points  Count back from 20 0 points  Months in reverse 0 points  Repeat phrase 2 points  Total Score 2    Immunization History  Administered Date(s) Administered  . Influenza, High Dose Seasonal PF 09/05/2015  . Influenza,inj,Quad PF,6+ Mos 08/14/2013  . Influenza-Unspecified 08/31/2017  . Pneumococcal Conjugate-13 04/02/2014  . Pneumococcal Polysaccharide-23 12/08/2004  . Zoster 03/03/2010    Qualifies for Shingles Vaccine? Due for Shingles vaccine. Declined my offer to administer today. Education has been provided regarding the importance of this vaccine. Pt has been advised to call her insurance company to determine her out of pocket expense. Advised she may also receive this vaccine at her local pharmacy or Health Dept. Verbalized acceptance and understanding.  Screening Tests Health Maintenance  Topic Date Due  . FOOT EXAM  01/20/1939  . OPHTHALMOLOGY EXAM  09/16/2017  . HEMOGLOBIN A1C  10/20/2017  . URINE MICROALBUMIN  04/20/2018  . TETANUS/TDAP  11/16/2026  (Originally 01/20/1948)  . INFLUENZA VACCINE  06/16/2018  . DEXA SCAN  Completed  . PNA vac Low Risk Adult  Completed    Cancer Screenings: Lung: Low Dose CT Chest recommended if Age 2-80 years, 30 pack-year currently smoking OR have quit w/in 15years. Patient does not qualify. Breast:  Up to date on Mammogram? Yes   Up to date of Bone Density/Dexa? Yes Colorectal: Up to date  Additional Screenings:  Hepatitis C Screening: N/A     Plan:  I have personally reviewed and addressed the Medicare Annual Wellness questionnaire and have noted the following in the patient's chart:  A. Medical and social history B. Use of alcohol, tobacco or illicit drugs  C. Current medications and supplements D. Functional ability and status E.  Nutritional status F.  Physical activity G. Advance directives H. List of other physicians I.  Hospitalizations, surgeries, and ER visits in previous 12 months J.  [place] such as hearing and vision if needed, cognitive and depression L. Referrals and appointments - none  In addition, I have reviewed and discussed with patient certain preventive protocols, quality metrics, and best practice recommendations. A written personalized care plan for preventive services as well as general preventive health recommendations were provided to patient.  See attached scanned questionnaire for additional information.   Signed,  Fabio Neighbors, LPN Nurse Health Advisor   Nurse Recommendations: Pt needs a diabetic foot exam and Hgb A1c checked today. Pt is currently due for an eye exam and plans on scheduling an apt this year.

## 2018-04-29 ENCOUNTER — Telehealth: Payer: Self-pay

## 2018-04-29 LAB — CBC WITH DIFFERENTIAL/PLATELET
BASOS ABS: 0 10*3/uL (ref 0.0–0.2)
Basos: 1 %
EOS (ABSOLUTE): 0.3 10*3/uL (ref 0.0–0.4)
Eos: 3 %
Hematocrit: 44.4 % (ref 34.0–46.6)
Hemoglobin: 15.4 g/dL (ref 11.1–15.9)
IMMATURE GRANS (ABS): 0 10*3/uL (ref 0.0–0.1)
IMMATURE GRANULOCYTES: 0 %
LYMPHS: 32 %
Lymphocytes Absolute: 2.7 10*3/uL (ref 0.7–3.1)
MCH: 29.8 pg (ref 26.6–33.0)
MCHC: 34.7 g/dL (ref 31.5–35.7)
MCV: 86 fL (ref 79–97)
MONOS ABS: 0.5 10*3/uL (ref 0.1–0.9)
Monocytes: 6 %
NEUTROS PCT: 58 %
Neutrophils Absolute: 4.9 10*3/uL (ref 1.4–7.0)
PLATELETS: 197 10*3/uL (ref 150–450)
RBC: 5.17 x10E6/uL (ref 3.77–5.28)
RDW: 13.1 % (ref 12.3–15.4)
WBC: 8.4 10*3/uL (ref 3.4–10.8)

## 2018-04-29 LAB — COMPREHENSIVE METABOLIC PANEL
ALT: 26 IU/L (ref 0–32)
AST: 24 IU/L (ref 0–40)
Albumin/Globulin Ratio: 1.9 (ref 1.2–2.2)
Albumin: 4.9 g/dL — ABNORMAL HIGH (ref 3.5–4.7)
Alkaline Phosphatase: 61 IU/L (ref 39–117)
BILIRUBIN TOTAL: 0.7 mg/dL (ref 0.0–1.2)
BUN/Creatinine Ratio: 18 (ref 12–28)
BUN: 14 mg/dL (ref 8–27)
CALCIUM: 10.6 mg/dL — AB (ref 8.7–10.3)
CHLORIDE: 101 mmol/L (ref 96–106)
CO2: 23 mmol/L (ref 20–29)
Creatinine, Ser: 0.8 mg/dL (ref 0.57–1.00)
GFR calc Af Amer: 77 mL/min/{1.73_m2} (ref >59)
GFR, EST NON AFRICAN AMERICAN: 67 mL/min/{1.73_m2} (ref >59)
GLUCOSE: 91 mg/dL (ref 65–99)
Globulin, Total: 2.6 g/dL (ref 1.5–4.5)
Potassium: 3.7 mmol/L (ref 3.5–5.2)
Sodium: 141 mmol/L (ref 134–144)
TOTAL PROTEIN: 7.5 g/dL (ref 6.0–8.5)

## 2018-04-29 LAB — LIPID PANEL WITH LDL/HDL RATIO
CHOLESTEROL TOTAL: 155 mg/dL (ref 100–199)
HDL: 45 mg/dL (ref >39)
LDL Calculated: 67 mg/dL (ref 0–99)
LDl/HDL Ratio: 1.5 ratio (ref 0.0–3.2)
TRIGLYCERIDES: 213 mg/dL — AB (ref 0–149)
VLDL Cholesterol Cal: 43 mg/dL — ABNORMAL HIGH (ref 5–40)

## 2018-04-29 LAB — HEMOGLOBIN A1C
ESTIMATED AVERAGE GLUCOSE: 134 mg/dL
Hgb A1c MFr Bld: 6.3 % — ABNORMAL HIGH (ref 4.8–5.6)

## 2018-04-29 NOTE — Telephone Encounter (Signed)
Called [facility] to add on test.sd

## 2018-04-29 NOTE — Telephone Encounter (Signed)
-----   Message from Mar Daring, [place] sent at 04/29/2018  2:04 PM EDT ----- Can we add PTH on to labs for elevated serum calcium please. Thanks.

## 2018-05-05 ENCOUNTER — Telehealth: Payer: Self-pay

## 2018-05-05 NOTE — Telephone Encounter (Signed)
Patient advised as below. Patient reports she will stop by lab tomorrow. sd

## 2018-05-05 NOTE — Telephone Encounter (Signed)
-----   Message from Mar Daring, PA-C sent at 05/04/2018 12:59 PM EDT ----- Blood count is normal. Kidney and liver function normal. Cholesterol stable. A1c improved from 7.2 to now 6.3. Calcium was elevated. I have requested an add on lab that I am still awaiting results for you for further evaluation. I will let you know once I receive the results. Continue all medications as prescribed.

## 2018-05-05 NOTE — Telephone Encounter (Signed)
Lab slip printed. 

## 2018-05-07 LAB — PTH, INTACT AND CALCIUM
Calcium: 10.4 mg/dL — ABNORMAL HIGH (ref 8.7–10.3)
PTH: 30 pg/mL (ref 15–65)

## 2018-05-09 ENCOUNTER — Telehealth: Payer: Self-pay

## 2018-05-09 DIAGNOSIS — I10 Essential (primary) hypertension: Secondary | ICD-10-CM

## 2018-05-09 MED ORDER — HYDROCHLOROTHIAZIDE 12.5 MG PO TABS: 12.5000 mg | ORAL_TABLET | ORAL | 3 refills | Status: DC

## 2018-05-09 NOTE — Telephone Encounter (Signed)
-----   Message from Mar Daring, [place] sent at 05/09/2018 10:47 AM EDT ----- Parathyroid hormone is normal so it is not source of borderline elevated calcium. If you are not taking a calcium supplement it may be secondary to your hydrochlorothiazide. It is very borderline high so not terribly high risk for complications at this level but could develop kidney stone. If interested may stop HCTZ and monitor, or just use every other day.

## 2018-05-09 NOTE — Telephone Encounter (Signed)
Patient advised as directed below.She reports she is going to do every other day.

## 2018-05-09 NOTE — Telephone Encounter (Signed)
Updated med list

## 2018-05-17 ENCOUNTER — Telehealth: Payer: Self-pay | Admitting: Physician Assistant

## 2018-05-17 NOTE — Telephone Encounter (Signed)
Patient came into office requesting to speak with a nurse about her BP. Patient states she was told to stop her BP medication, but this am she got to feeling dizzy and light headed,so she got a nurse to cheek it at the hospital where she [facility] and it was 170/73 Pulse was 76 O2 was 100.  Patient then went home and took one of BP med's and laid down and when she got up she states she felt better. Thanks CC

## 2018-05-17 NOTE — Telephone Encounter (Signed)
Spoke with patient. She was feeling much better since she took HCTZ today.. Patient states she did not understand the telephone message from 05/09/18. She thought she needed to stop taking medication completely. Re-read message to patient. She verbalized understanding. Patient states she will take HCTZ every other day and monitor BP readings at home.

## 2018-06-13 DIAGNOSIS — I10 Essential (primary) hypertension: Secondary | ICD-10-CM | POA: Diagnosis not present

## 2018-06-13 DIAGNOSIS — I6529 Occlusion and stenosis of unspecified carotid artery: Secondary | ICD-10-CM | POA: Diagnosis not present

## 2018-06-13 DIAGNOSIS — I350 Nonrheumatic aortic (valve) stenosis: Secondary | ICD-10-CM | POA: Diagnosis not present

## 2018-06-13 DIAGNOSIS — E782 Mixed hyperlipidemia: Secondary | ICD-10-CM | POA: Diagnosis not present

## 2018-06-27 DIAGNOSIS — I350 Nonrheumatic aortic (valve) stenosis: Secondary | ICD-10-CM | POA: Diagnosis not present

## 2018-07-12 ENCOUNTER — Other Ambulatory Visit: Payer: Self-pay | Admitting: Physician Assistant

## 2018-07-12 DIAGNOSIS — G4700 Insomnia, unspecified: Secondary | ICD-10-CM

## 2018-07-12 MED ORDER — TEMAZEPAM 15 MG PO CAPS: 15.0000 mg | ORAL_CAPSULE | Freq: Every day | ORAL | 1 refills | Status: DC

## 2018-07-12 NOTE — Telephone Encounter (Signed)
Pt needs a refill on her Temazepam 15 mg  [place]  [facility]

## 2018-08-08 DIAGNOSIS — Z23 Encounter for immunization: Secondary | ICD-10-CM | POA: Diagnosis not present

## 2018-10-05 ENCOUNTER — Ambulatory Visit (INDEPENDENT_AMBULATORY_CARE_PROVIDER_SITE_OTHER): Payer: Medicare Other | Admitting: Physician Assistant

## 2018-10-05 ENCOUNTER — Encounter: Payer: Self-pay | Admitting: Physician Assistant

## 2018-10-05 VITALS — BP 132/72 | HR 60 | Temp 97.7°F | Resp 16 | Wt 136.0 lb

## 2018-10-05 DIAGNOSIS — B379 Candidiasis, unspecified: Secondary | ICD-10-CM

## 2018-10-05 DIAGNOSIS — I6523 Occlusion and stenosis of bilateral carotid arteries: Secondary | ICD-10-CM

## 2018-10-05 DIAGNOSIS — E119 Type 2 diabetes mellitus without complications: Secondary | ICD-10-CM

## 2018-10-05 MED ORDER — GLIPIZIDE ER 10 MG PO TB24: 10.0000 mg | ORAL_TABLET | Freq: Every day | ORAL | 0 refills | Status: DC

## 2018-10-05 MED ORDER — FLUCONAZOLE 150 MG PO TABS: 150.0000 mg | ORAL_TABLET | Freq: Once | ORAL | 0 refills | Status: AC

## 2018-10-05 NOTE — Progress Notes (Signed)
Patient: [name] Female    DOB: 06-18-1929   84 y.o.   MRN: [id number] Visit Date: 10/05/2018  Today's Provider: Mar Daring, PA-C   Chief Complaint  Patient presents with  . Vaginal Itching   Subjective:    Vaginal Itching  The patient's primary symptoms include genital itching. The patient's pertinent negatives include no genital odor. This is a new problem. The current episode started 1 to 4 weeks ago. The problem occurs constantly. The problem has been unchanged. Associated symptoms include abdominal pain, diarrhea, frequency and urgency. Pertinent negatives include no back pain, dysuria, flank pain, nausea or vomiting. Nothing aggravates the symptoms. Treatments tried: Vagasil. The treatment provided no relief.   Patient also has lower abdominal pain in the morning with frequent BM (4 times per day usually). She feels this is due to the metformin.    Allergies  Allergen Reactions  . Cephalosporins   . Codeine   . Macrolides And Ketolides   . Penicillins   . Tramadol Nausea Only  . Doxycycline Rash  . Sulfa Antibiotics Rash     Current Outpatient Medications:  .  amLODipine (NORVASC) 10 MG tablet, Take 1 tablet (10 mg total) by mouth daily., Disp: 90 tablet, Rfl: 3 .  aspirin EC 81 MG tablet, Take 81 mg by mouth daily. , Disp: , Rfl:  .  atorvastatin (LIPITOR) 40 MG tablet, Take 1 tablet (40 mg total) by mouth daily at 6 PM., Disp: 90 tablet, Rfl: 3 .  cholecalciferol (VITAMIN D) 1000 UNITS tablet, Take 1,000 Units by mouth daily. , Disp: , Rfl:  .  erythromycin ophthalmic ointment, Place 1 application into both eyes once. Reported on 03/25/2016, Disp: , Rfl:  .  glucose blood (ONE TOUCH ULTRA TEST) test strip, Check blood glucose once daily, Disp: 100 each, Rfl: 12 .  hydrochlorothiazide (HYDRODIURIL) 12.5 MG tablet, Take 1 tablet (12.5 mg total) by mouth every other day., Disp: 90 tablet, Rfl: 3 .  metFORMIN (GLUCOPHAGE-XR) 500 MG 24 hr tablet, Take 2  tablets (1,000 mg total) by mouth daily with breakfast., Disp: 180 tablet, Rfl: 3 .  OMEGA-3 FATTY ACIDS PO, Take 1 capsule by mouth daily. , Disp: , Rfl:  .  temazepam (RESTORIL) 15 MG capsule, Take 1 capsule (15 mg total) by mouth at bedtime., Disp: 90 capsule, Rfl: 1 .  oxybutynin (DITROPAN) 5 MG tablet, Take 1 tablet (5 mg total) by mouth 2 (two) times daily. (Patient not taking: Reported on 10/05/2018), Disp: 180 tablet, Rfl: 3 .  polyethylene glycol (MIRALAX / GLYCOLAX) packet, Take 17 g by mouth daily., Disp: , Rfl:  .  triamcinolone cream (KENALOG) 0.1 %, Apply 1 application topically 2 (two) times daily. (Patient not taking: Reported on 10/05/2018), Disp: 30 g, Rfl: 0  Review of Systems  Constitutional: Negative.   Respiratory: Negative.   Cardiovascular: Negative.   Gastrointestinal: Positive for abdominal pain and diarrhea. Negative for anal bleeding, blood in stool, nausea and vomiting.  Genitourinary: Positive for enuresis, frequency and urgency. Negative for dysuria and flank pain.  Musculoskeletal: Negative for back pain.    Social History   Tobacco Use  . Smoking status: Former Smoker    Packs/day: 0.50    Last attempt to quit: 11/15/1976    Years since quitting: 41.9  . Smokeless tobacco: Never Used  Substance Use Topics  . Alcohol use: No   Objective:   BP 132/72 (BP Location: Left Arm, Patient Position: Sitting,  Cuff Size: Normal)   Pulse 60   Temp 97.7 F (36.5 C) (Oral)   Resp 16   Wt 136 lb (61.7 kg)   BMI 24.87 kg/m  Vitals:   10/05/18 0950  BP: 132/72  Pulse: 60  Resp: 16  Temp: 97.7 F (36.5 C)  TempSrc: Oral  Weight: 136 lb (61.7 kg)     Physical Exam  Constitutional: She appears well-developed and well-nourished.  HENT:  Head: Normocephalic and atraumatic.  Eyes: EOM are normal.  Neck: Normal range of motion. Neck supple.  Pulmonary/Chest: Effort normal. No respiratory distress.  Genitourinary: There is rash on the right labia. There  is no tenderness, lesion or injury on the right labia. There is rash on the left labia. There is no tenderness, lesion or injury on the left labia. There is erythema and tenderness in the vagina.  Psychiatric: She has a normal mood and affect. Her behavior is normal. Judgment and thought content normal.  Vitals reviewed.       Assessment & Plan:     1. Diabetes mellitus without complication (HCC) Stop metformin. Start glipizide. I will see her back in 3 months to recheck.  - glipiZIDE (GLUCOTROL XL) 10 MG 24 hr tablet; Take 1 tablet (10 mg total) by mouth daily with breakfast.  Dispense: 90 tablet; Refill: 0  2. Yeast infection Vaginal yeast infection most likely secondary to moisture (urine leakage and wearing pads). Will treat with diflucan as below. She is to call if no improvements.  - fluconazole (DIFLUCAN) 150 MG tablet; Take 1 tablet (150 mg total) by mouth once for 1 dose.  Dispense: 2 tablet; Refill: 0       Mar Daring, PA-C  [facility]

## 2018-10-05 NOTE — Patient Instructions (Signed)
Vaginal Yeast infection, Adult Vaginal yeast infection is a condition that causes soreness, swelling, and redness (inflammation) of the vagina. It also causes vaginal discharge. This is a common condition. Some women get this infection frequently. What are the causes? This condition is caused by a change in the normal balance of the yeast (candida) and bacteria that live in the vagina. This change causes an overgrowth of yeast, which causes the inflammation. What increases the risk? This condition is more likely to develop in:  Women who take antibiotic medicines.  Women who have diabetes.  Women who take birth control pills.  Women who are pregnant.  Women who douche often.  Women who have a weak defense (immune) system.  Women who have been taking steroid medicines for a long time.  Women who frequently wear tight clothing.  What are the signs or symptoms? Symptoms of this condition include:  White, thick vaginal discharge.  Swelling, itching, redness, and irritation of the vagina. The lips of the vagina (vulva) may be affected as well.  Pain or a burning feeling while urinating.  Pain during sex.  How is this diagnosed? This condition is diagnosed with a medical history and physical exam. This will include a pelvic exam. Your health care provider will examine a sample of your vaginal discharge under a microscope. Your health care provider may send this sample for testing to confirm the diagnosis. How is this treated? This condition is treated with medicine. Medicines may be over-the-counter or prescription. You may be told to use one or more of the following:  Medicine that is taken orally.  Medicine that is applied as a cream.  Medicine that is inserted directly into the vagina (suppository).  Follow these instructions at home:  Take or apply over-the-counter and prescription medicines only as told by your health care provider.  Do not have sex until your health  care provider has approved. Tell your sex partner that you have a yeast infection. That person should go to his or her health care provider if he or she develops symptoms.  Do not wear tight clothes, such as pantyhose or tight pants.  Avoid using tampons until your health care provider approves.  Eat more yogurt. This may help to keep your yeast infection from returning.  Try taking a sitz bath to help with discomfort. This is a warm water bath that is taken while you are sitting down. The water should only come up to your hips and should cover your buttocks. Do this 3-4 times per day or as told by your health care provider.  Do not douche.  Wear breathable, cotton underwear.  If you have diabetes, keep your blood sugar levels under control. Contact a health care provider if:  You have a fever.  Your symptoms go away and then return.  Your symptoms do not get better with treatment.  Your symptoms get worse.  You have new symptoms.  You develop blisters in or around your vagina.  You have blood coming from your vagina and it is not your menstrual period.  You develop pain in your abdomen. This information is not intended to replace advice given to you by your health care provider. Make sure you discuss any questions you have with your health care provider. Document Released: 08/12/2005 Document Revised: 04/15/2016 Document Reviewed: 05/06/2015 Elsevier Interactive Patient Education  2018 [employer].  Glipizide Extended-release tablets What is this medicine? GLIPIZIDE (GLIP i zide) helps to treat type 2 diabetes. It is combined  with diet and exercise. The medicine helps your body to use insulin better. This medicine may be used for other purposes; ask your health care provider or pharmacist if you have questions. COMMON BRAND NAME(S): Glucotrol XL What should I tell my health care provider before I take this medicine? They need to know if you have any of these  conditions: -diabetic ketoacidosis -glucose-6-phosphate dehydrogenase deficiency -heart disease -kidney disease -liver disease -porphyria -severe infection or injury -thyroid disease -an unusual or allergic reaction to glipizide, sulfa drugs, other medicines, foods, dyes, or preservatives -pregnant or trying to get pregnant -breast-feeding How should I use this medicine? Take this medicine by mouth. Follow the directions on the prescription label. Swallow the tablets with a drink of water and take with your breakfast. Take your medicine at the same time each day. Do not take more often than directed. Talk to your pediatrician regarding the use of this medicine in children. Special care may be needed. Elderly patients over 75 years old may have a stronger reaction and need a smaller dose. Overdosage: If you think you have taken too much of this medicine contact a poison control center or emergency room at once. NOTE: This medicine is only for you. Do not share this medicine with others. What if I miss a dose? If you miss a dose, take it as soon as you can. If it is almost time for your next dose, take only that dose. Do not take double or extra doses. What may interact with this medicine? -bosentan -chloramphenicol -cisapride -medicines for fungal or yeast infections -metoclopramide -probenecid -warfarin Many medications may cause an increase or decrease in blood sugar, these include: -alcohol containing beverages -aspirin and aspirin-like drugs -chloramphenicol -chromium -clarithromycin -female hormones, like estrogens or progestins and birth control pills -heart medicines -isoniazid -female hormones or anabolic steroids -medicines for weight loss -medicines for allergies, asthma, cold, or cough -medicines for mental problems -medicines called MAO Inhibitors like Nardil, Parnate, Marplan, Eldepryl -niacin -NSAIDs, medicines for pain and inflammation, like ibuprofen or  naproxen -pentamidine -phenytoin -probenecid -quinolone antibiotics like ciprofloxacin, levofloxacin, ofloxacin -some herbal dietary supplements -steroid medicines like prednisone or cortisone -thyroid medicine -water pills or diuretics This list may not describe all possible interactions. Give your health care provider a list of all the medicines, herbs, non-prescription drugs, or dietary supplements you use. Also tell them if you smoke, drink alcohol, or use illegal drugs. Some items may interact with your medicine. What should I watch for while using this medicine? Visit your doctor or health care professional for regular checks on your progress. A test called the HbA1C (A1C) will be monitored. This is a simple blood test. It measures your blood sugar control over the last 2 to 3 months. You will receive this test every 3 to 6 months. Learn how to check your blood sugar. Learn the symptoms of low and high blood sugar and how to manage them. Always carry a quick-source of sugar with you in case you have symptoms of low blood sugar. Examples include hard sugar candy or glucose tablets. Make sure others know that you can choke if you eat or drink when you develop serious symptoms of low blood sugar, such as seizures or unconsciousness. They must get medical help at once. Tell your doctor or health care professional if you have high blood sugar. You might need to change the dose of your medicine. If you are sick or exercising more than usual, you might need to change the  dose of your medicine. Do not skip meals. Ask your doctor or health care professional if you should avoid alcohol. Many nonprescription cough and cold products contain sugar or alcohol. These can affect blood sugar. This medicine can make you more sensitive to the sun. Keep out of the sun. If you cannot avoid being in the sun, wear protective clothing and use sunscreen. Do not use sun lamps or tanning beds/booths. Wear a medical ID  bracelet or chain, and carry a card that describes your disease and details of your medicine and dosage times. What side effects may I notice from receiving this medicine? Side effects that you should report to your doctor or health care professional as soon as possible: -allergic reactions like skin rash, itching or hives, swelling of the face, lips, or tongue -breathing problems -dark urine -fever, chills, sore throat -signs and symptoms of low blood sugar such as feeling anxious, confusion, dizziness, increased hunger, unusually weak or tired, sweating, shakiness, cold, irritable, headache, blurred vision, fast heartbeat, loss of consciousness -unusual bleeding or bruising -yellowing of the eyes or skin Side effects that usually do not require medical attention (report to your doctor or health care professional if they continue or are bothersome): -diarrhea -dizziness -headache -heartburn -nausea -stomach gas This list may not describe all possible side effects. Call your doctor for medical advice about side effects. You may report side effects to FDA at 1-800-FDA-1088. Where should I keep my medicine? Keep out of the reach of children. Store at room temperature between 15 to 30 degrees C (59 to 86 degrees F). Protect from moisture and humidity. Throw away any unused medicine after the expiration date. NOTE: This sheet is a summary. It may not cover all possible information. If you have questions about this medicine, talk to your doctor, pharmacist, or health care provider.  2018 Elsevier/Gold Standard (2013-02-15 14:32:16)

## 2018-10-10 ENCOUNTER — Telehealth: Payer: Self-pay | Admitting: Physician Assistant

## 2018-10-10 DIAGNOSIS — B379 Candidiasis, unspecified: Secondary | ICD-10-CM

## 2018-10-10 DIAGNOSIS — T3695XA Adverse effect of unspecified systemic antibiotic, initial encounter: Principal | ICD-10-CM

## 2018-10-10 MED ORDER — FLUCONAZOLE 150 MG PO TABS: 150.0000 mg | ORAL_TABLET | Freq: Once | ORAL | 0 refills | Status: AC

## 2018-10-10 NOTE — Telephone Encounter (Signed)
Sent in

## 2018-10-10 NOTE — Telephone Encounter (Signed)
Patient states that what you gave her for her yeast infection is not working.  She did state that it a little but is still itching.  She would like to know if you can give her something else.  She will be out of town at the end of the week and would like to resolve this before she goes.  She uses Paediatric nurse on [address].  She states that if she is not there to leave a message.

## 2018-10-10 NOTE — Telephone Encounter (Signed)
Patient advised. [name]

## 2018-11-14 DIAGNOSIS — L600 Ingrowing nail: Secondary | ICD-10-CM | POA: Diagnosis not present

## 2018-11-14 DIAGNOSIS — L608 Other nail disorders: Secondary | ICD-10-CM | POA: Diagnosis not present

## 2018-12-12 DIAGNOSIS — I10 Essential (primary) hypertension: Secondary | ICD-10-CM | POA: Diagnosis not present

## 2018-12-12 DIAGNOSIS — E119 Type 2 diabetes mellitus without complications: Secondary | ICD-10-CM | POA: Diagnosis not present

## 2018-12-12 DIAGNOSIS — I6529 Occlusion and stenosis of unspecified carotid artery: Secondary | ICD-10-CM | POA: Diagnosis not present

## 2018-12-12 DIAGNOSIS — I350 Nonrheumatic aortic (valve) stenosis: Secondary | ICD-10-CM | POA: Diagnosis not present

## 2018-12-12 DIAGNOSIS — Z8673 Personal history of transient ischemic attack (TIA), and cerebral infarction without residual deficits: Secondary | ICD-10-CM | POA: Diagnosis not present

## 2018-12-12 DIAGNOSIS — E782 Mixed hyperlipidemia: Secondary | ICD-10-CM | POA: Diagnosis not present

## 2019-01-02 DIAGNOSIS — Z08 Encounter for follow-up examination after completed treatment for malignant neoplasm: Secondary | ICD-10-CM | POA: Diagnosis not present

## 2019-01-02 DIAGNOSIS — L570 Actinic keratosis: Secondary | ICD-10-CM | POA: Diagnosis not present

## 2019-01-02 DIAGNOSIS — L218 Other seborrheic dermatitis: Secondary | ICD-10-CM | POA: Diagnosis not present

## 2019-01-02 DIAGNOSIS — Z85828 Personal history of other malignant neoplasm of skin: Secondary | ICD-10-CM | POA: Diagnosis not present

## 2019-01-02 DIAGNOSIS — L538 Other specified erythematous conditions: Secondary | ICD-10-CM | POA: Diagnosis not present

## 2019-01-02 DIAGNOSIS — X32XXXA Exposure to sunlight, initial encounter: Secondary | ICD-10-CM | POA: Diagnosis not present

## 2019-01-02 DIAGNOSIS — D0471 Carcinoma in situ of skin of right lower limb, including hip: Secondary | ICD-10-CM | POA: Diagnosis not present

## 2019-01-02 DIAGNOSIS — D044 Carcinoma in situ of skin of scalp and neck: Secondary | ICD-10-CM | POA: Diagnosis not present

## 2019-01-02 DIAGNOSIS — D485 Neoplasm of uncertain behavior of skin: Secondary | ICD-10-CM | POA: Diagnosis not present

## 2019-01-02 DIAGNOSIS — L820 Inflamed seborrheic keratosis: Secondary | ICD-10-CM | POA: Diagnosis not present

## 2019-01-03 NOTE — Progress Notes (Deleted)
Patient: [name] Female    DOB: 1929-10-06   85 y.o.   MRN: [id number] Visit Date: 01/03/2019  Today's Provider: Mar Daring, PA-C   No chief complaint on file.  Subjective:     HPI   Diabetes Mellitus Type II, Follow-up:   Lab Results  Component Value Date   HGBA1C 6.3 (H) 04/28/2018   HGBA1C 7.2 (H) 04/20/2017   HGBA1C 6.9 09/07/2016    Last seen for diabetes 3 months ago.  Management since then includes Stop metformin. Start glipizide. She reports {excellent/good/fair/poor:19665} compliance with treatment. She {ACTION; IS/IS [id number]} having side effects. *** Current symptoms include {Symptoms; diabetes:14075} and have been {Desc; course:15616}. Home blood sugar records: {diabetes glucometry results:16657}  Episodes of hypoglycemia? {yes***/no:17258}   Current Insulin Regimen: *** Most Recent Eye Exam: *** Weight trend: {trend:16658} Prior visit with dietician: {yes/no:17258} Current diet: {diet habits:16563} Current exercise: {exercise types:16438}  Pertinent Labs:    Component Value Date/Time   CHOL 155 04/28/2018 1610   CHOL 177 12/16/2014 0315   TRIG 213 (H) 04/28/2018 1610   TRIG 233 (H) 12/16/2014 0315   HDL 45 04/28/2018 1610   HDL 38 (L) 12/16/2014 0315   LDLCALC 67 04/28/2018 1610   LDLCALC 92 12/16/2014 0315   CREATININE 0.80 04/28/2018 1610   CREATININE 0.94 12/16/2014 0315    Wt Readings from Last 3 Encounters:  10/05/18 136 lb (61.7 kg)  04/28/18 136 lb (61.7 kg)  04/28/18 136 lb (61.7 kg)    ------------------------------------------------------------------------   Allergies  Allergen Reactions  . Cephalosporins   . Codeine   . Macrolides And Ketolides   . Penicillins   . Tramadol Nausea Only  . Doxycycline Rash  . Sulfa Antibiotics Rash     Current Outpatient Medications:  .  amLODipine (NORVASC) 10 MG tablet, Take 1 tablet (10 mg total) by mouth daily., Disp: 90 tablet, Rfl: 3 .  aspirin EC 81 MG  tablet, Take 81 mg by mouth daily. , Disp: , Rfl:  .  atorvastatin (LIPITOR) 40 MG tablet, Take 1 tablet (40 mg total) by mouth daily at 6 PM., Disp: 90 tablet, Rfl: 3 .  cholecalciferol (VITAMIN D) 1000 UNITS tablet, Take 1,000 Units by mouth daily. , Disp: , Rfl:  .  erythromycin ophthalmic ointment, Place 1 application into both eyes once. Reported on 03/25/2016, Disp: , Rfl:  .  glipiZIDE (GLUCOTROL XL) 10 MG 24 hr tablet, Take 1 tablet (10 mg total) by mouth daily with breakfast., Disp: 90 tablet, Rfl: 0 .  glucose blood (ONE TOUCH ULTRA TEST) test strip, Check blood glucose once daily, Disp: 100 each, Rfl: 12 .  hydrochlorothiazide (HYDRODIURIL) 12.5 MG tablet, Take 1 tablet (12.5 mg total) by mouth every other day., Disp: 90 tablet, Rfl: 3 .  OMEGA-3 FATTY ACIDS PO, Take 1 capsule by mouth daily. , Disp: , Rfl:  .  oxybutynin (DITROPAN) 5 MG tablet, Take 1 tablet (5 mg total) by mouth 2 (two) times daily. (Patient not taking: Reported on 10/05/2018), Disp: 180 tablet, Rfl: 3 .  polyethylene glycol (MIRALAX / GLYCOLAX) packet, Take 17 g by mouth daily., Disp: , Rfl:  .  temazepam (RESTORIL) 15 MG capsule, Take 1 capsule (15 mg total) by mouth at bedtime., Disp: 90 capsule, Rfl: 1 .  triamcinolone cream (KENALOG) 0.1 %, Apply 1 application topically 2 (two) times daily. (Patient not taking: Reported on 10/05/2018), Disp: 30 g, Rfl: 0  Review of Systems  Social  History   Tobacco Use  . Smoking status: Former Smoker    Packs/day: 0.50    Last attempt to quit: 11/15/1976    Years since quitting: 42.1  . Smokeless tobacco: Never Used  Substance Use Topics  . Alcohol use: No      Objective:   There were no vitals taken for this visit. There were no vitals filed for this visit.   Physical Exam      Assessment & [place], PA-C  [facility]

## 2019-01-04 ENCOUNTER — Ambulatory Visit: Payer: Medicare Other | Admitting: Physician Assistant

## 2019-01-06 NOTE — Progress Notes (Signed)
Patient: [name] Female    DOB: 1928-11-24   85 y.o.   MRN: [id number] Visit Date: 01/09/2019  Today's Provider: Mar Daring, PA-C   Chief Complaint  Patient presents with  . Follow-up    T2DM   Subjective:     HPI   Diabetes Mellitus Type II, Follow-up:   Lab Results  Component Value Date   HGBA1C 6.6 (A) 01/09/2019   HGBA1C 6.3 (H) 04/28/2018   HGBA1C 7.2 (H) 04/20/2017    Last seen for diabetes 3 months ago.  Management since then includes stop Metformin (had diarrhea). Start Glipizide. She reports excellent compliance with treatment. She is having side effects.Loose stool. She reports that she is taking the Glipizide now every other day and the loose stool have improved. Current symptoms include none and have been stable. Home blood sugar records: fasting range: 150's  Episodes of hypoglycemia? no   Most Recent Eye Exam: She is going today to [facility] to scheduled appt. Weight trend: stable Current diet: in general, an "unhealthy" diet Current exercise: keeps active.  Pertinent Labs:    Component Value Date/Time   CHOL 155 04/28/2018 1610   CHOL 177 12/16/2014 0315   TRIG 213 (H) 04/28/2018 1610   TRIG 233 (H) 12/16/2014 0315   HDL 45 04/28/2018 1610   HDL 38 (L) 12/16/2014 0315   LDLCALC 67 04/28/2018 1610   LDLCALC 92 12/16/2014 0315   CREATININE 0.80 04/28/2018 1610   CREATININE 0.94 12/16/2014 0315    Wt Readings from Last 3 Encounters:  01/09/19 140 lb 3.2 oz (63.6 kg)  10/05/18 136 lb (61.7 kg)  04/28/18 136 lb (61.7 kg)    ------------------------------------------------------------------------   Allergies  Allergen Reactions  . Cephalosporins   . Codeine   . Macrolides And Ketolides   . Penicillins   . Tramadol Nausea Only  . Doxycycline Rash  . Sulfa Antibiotics Rash     Current Outpatient Medications:  .  amLODipine (NORVASC) 10 MG tablet, Take 1 tablet (10 mg total) by mouth daily., Disp: 90 tablet, Rfl: 3 .   aspirin EC 81 MG tablet, Take 81 mg by mouth daily. , Disp: , Rfl:  .  atorvastatin (LIPITOR) 40 MG tablet, Take 1 tablet (40 mg total) by mouth daily at 6 PM., Disp: 90 tablet, Rfl: 3 .  cholecalciferol (VITAMIN D) 1000 UNITS tablet, Take 1,000 Units by mouth daily. , Disp: , Rfl:  .  erythromycin ophthalmic ointment, Place 1 application into both eyes once. Reported on 03/25/2016, Disp: , Rfl:  .  glipiZIDE (GLUCOTROL XL) 10 MG 24 hr tablet, Take 1 tablet (10 mg total) by mouth daily with breakfast., Disp: 90 tablet, Rfl: 0 .  glucose blood (ONE TOUCH ULTRA TEST) test strip, Check blood glucose once daily, Disp: 100 each, Rfl: 12 .  hydrochlorothiazide (HYDRODIURIL) 12.5 MG tablet, Take 1 tablet (12.5 mg total) by mouth every other day., Disp: 90 tablet, Rfl: 3 .  OMEGA-3 FATTY ACIDS PO, Take 1 capsule by mouth daily. , Disp: , Rfl:  .  oxybutynin (DITROPAN) 5 MG tablet, Take 1 tablet (5 mg total) by mouth 2 (two) times daily., Disp: 180 tablet, Rfl: 3 .  polyethylene glycol (MIRALAX / GLYCOLAX) packet, Take 17 g by mouth daily., Disp: , Rfl:  .  temazepam (RESTORIL) 15 MG capsule, Take 1 capsule (15 mg total) by mouth at bedtime., Disp: 90 capsule, Rfl: 1 .  triamcinolone cream (KENALOG) 0.1 %, Apply  1 application topically 2 (two) times daily. (Patient not taking: Reported on 10/05/2018), Disp: 30 g, Rfl: 0  Review of Systems  Constitutional: Negative.   Respiratory: Negative.   Cardiovascular: Negative.   Gastrointestinal: Positive for diarrhea.  Musculoskeletal: Negative.   Neurological: Negative.     Social History   Tobacco Use  . Smoking status: Former Smoker    Packs/day: 0.50    Last attempt to quit: 11/15/1976    Years since quitting: 42.1  . Smokeless tobacco: Never Used  Substance Use Topics  . Alcohol use: No      Objective:   BP (!) 148/77 (BP Location: Left Arm, Patient Position: Sitting, Cuff Size: Large)   Pulse 73   Temp 97.9 F (36.6 C) (Oral)   Wt 140 lb  3.2 oz (63.6 kg)   BMI 25.64 kg/m  Vitals:   01/09/19 0942  BP: (!) 148/77  Pulse: 73  Temp: 97.9 F (36.6 C)  TempSrc: Oral  Weight: 140 lb 3.2 oz (63.6 kg)     Physical Exam Vitals signs reviewed.  Constitutional:      General: She is not in acute distress.    Appearance: She is well-developed. She is not diaphoretic.  Neck:     Musculoskeletal: Normal range of motion and neck supple.     Thyroid: No thyromegaly.     Vascular: No JVD.     Trachea: No tracheal deviation.  Cardiovascular:     Rate and Rhythm: Normal rate and regular rhythm.     Pulses:          Dorsalis pedis pulses are 2+ on the right side and 2+ on the left side.       Posterior tibial pulses are 2+ on the right side and 2+ on the left side.     Heart sounds: Murmur present. No friction rub. No gallop.   Pulmonary:     Effort: Pulmonary effort is normal. No respiratory distress.     Breath sounds: Normal breath sounds. No wheezing or rales.  Musculoskeletal:     Right foot: Normal range of motion. No deformity.     Left foot: Normal range of motion. No deformity.  Feet:     Right foot:     Protective Sensation: 10 sites tested. 10 sites sensed.     Skin integrity: Skin integrity normal.     Left foot:     Protective Sensation: 10 sites tested. 10 sites sensed.     Skin integrity: Skin integrity normal.  Lymphadenopathy:     Cervical: No cervical adenopathy.    Diabetic Foot Exam - Simple   Simple Foot Form Diabetic Foot exam was performed with the following findings:  Yes 01/09/2019 10:24 AM  Visual Inspection No deformities, no ulcerations, no other skin breakdown bilaterally:  Yes Sensation Testing Intact to touch and monofilament testing bilaterally:  Yes Pulse Check Posterior Tibialis and Dorsalis pulse intact bilaterally:  Yes Comments         Assessment & Plan    1. Diabetes mellitus without complication (HCC) Q5Z up slightly to 6.6 from 6.3. Will continue glipizide every other  day at this time since BM are better at this dosing. I will see her back in 4 months to recheck her A1c. I will also get microalbumin at this time since she was unable to void today. This appt will also be her CPE.  - POCT glycosylated hemoglobin (Hb A1C) - glipiZIDE (GLUCOTROL XL) 10 MG 24 hr tablet;  Take 1 tablet (10 mg total) by mouth every other day.  Dispense: 45 tablet; Refill: 3  2. Insomnia, unspecified type Stable. Diagnosis pulled for medication refill. Continue current medical treatment plan. - temazepam (RESTORIL) 15 MG capsule; Take 1 capsule (15 mg total) by mouth at bedtime.  Dispense: 90 capsule; Refill: [place], PA-C  [facility]

## 2019-01-09 ENCOUNTER — Encounter: Payer: Self-pay | Admitting: Physician Assistant

## 2019-01-09 ENCOUNTER — Ambulatory Visit (INDEPENDENT_AMBULATORY_CARE_PROVIDER_SITE_OTHER): Payer: Medicare Other | Admitting: Physician Assistant

## 2019-01-09 VITALS — BP 148/77 | HR 73 | Temp 97.9°F | Wt 140.2 lb

## 2019-01-09 DIAGNOSIS — E119 Type 2 diabetes mellitus without complications: Secondary | ICD-10-CM | POA: Diagnosis not present

## 2019-01-09 DIAGNOSIS — G4700 Insomnia, unspecified: Secondary | ICD-10-CM

## 2019-01-09 LAB — POCT GLYCOSYLATED HEMOGLOBIN (HGB A1C)
ESTIMATED AVERAGE GLUCOSE: 143
HEMOGLOBIN A1C: 6.6 % — AB (ref 4.0–5.6)

## 2019-01-09 MED ORDER — GLIPIZIDE ER 10 MG PO TB24: 10.0000 mg | ORAL_TABLET | ORAL | 3 refills | Status: DC

## 2019-01-09 MED ORDER — TEMAZEPAM 15 MG PO CAPS: 15.0000 mg | ORAL_CAPSULE | Freq: Every day | ORAL | 1 refills | Status: DC

## 2019-01-09 NOTE — Patient Instructions (Signed)
Diabetes Mellitus and Exercise Exercising regularly is important for your overall health, especially when you have diabetes (diabetes mellitus). Exercising is not only about losing weight. It has many other health benefits, such as increasing muscle strength and bone density and reducing body fat and stress. This leads to improved fitness, flexibility, and endurance, all of which result in better overall health. Exercise has additional benefits for people with diabetes, including:  Reducing appetite.  Helping to lower and control blood glucose.  Lowering blood pressure.  Helping to control amounts of fatty substances (lipids) in the blood, such as cholesterol and triglycerides.  Helping the body to respond better to insulin (improving insulin sensitivity).  Reducing how much insulin the body needs.  Decreasing the risk for heart disease by: ? Lowering cholesterol and triglyceride levels. ? Increasing the levels of good cholesterol. ? Lowering blood glucose levels. What is my activity plan? Your health care provider or certified diabetes educator can help you make a plan for the type and frequency of exercise (activity plan) that works for you. Make sure that you:  Do at least 150 minutes of moderate-intensity or vigorous-intensity exercise each week. This could be brisk walking, biking, or water aerobics. ? Do stretching and strength exercises, such as yoga or weightlifting, at least 2 times a week. ? Spread out your activity over at least 3 days of the week.  Get some form of physical activity every day. ? Do not go more than 2 days in a row without some kind of physical activity. ? Avoid being inactive for more than 30 minutes at a time. Take frequent breaks to walk or stretch.  Choose a type of exercise or activity that you enjoy, and set realistic goals.  Start slowly, and gradually increase the intensity of your exercise over time. What do I need to know about managing my  diabetes?   Check your blood glucose before and after exercising. ? If your blood glucose is 240 mg/dL (13.3 mmol/L) or higher before you exercise, check your urine for ketones. If you have ketones in your urine, do not exercise until your blood glucose returns to normal. ? If your blood glucose is 100 mg/dL (5.6 mmol/L) or lower, eat a snack containing 15-20 grams of carbohydrate. Check your blood glucose 15 minutes after the snack to make sure that your level is above 100 mg/dL (5.6 mmol/L) before you start your exercise.  Know the symptoms of low blood glucose (hypoglycemia) and how to treat it. Your risk for hypoglycemia increases during and after exercise. Common symptoms of hypoglycemia can include: ? Hunger. ? Anxiety. ? Sweating and feeling clammy. ? Confusion. ? Dizziness or feeling light-headed. ? Increased heart rate or palpitations. ? Blurry vision. ? Tingling or numbness around the mouth, lips, or tongue. ? Tremors or shakes. ? Irritability.  Keep a rapid-acting carbohydrate snack available before, during, and after exercise to help prevent or treat hypoglycemia.  Avoid injecting insulin into areas of the body that are going to be exercised. For example, avoid injecting insulin into: ? The arms, when playing tennis. ? The legs, when jogging.  Keep records of your exercise habits. Doing this can help you and your health care provider adjust your diabetes management plan as needed. Write down: ? Food that you eat before and after you exercise. ? Blood glucose levels before and after you exercise. ? The type and amount of exercise you have done. ? When your insulin is expected to peak, if you use   insulin. Avoid exercising at times when your insulin is peaking.  When you start a new exercise or activity, work with your health care provider to make sure the activity is safe for you, and to adjust your insulin, medicines, or food intake as needed.  Drink plenty of water while  you exercise to prevent dehydration or heat stroke. Drink enough fluid to keep your urine clear or pale yellow. Summary  Exercising regularly is important for your overall health, especially when you have diabetes (diabetes mellitus).  Exercising has many health benefits, such as increasing muscle strength and bone density and reducing body fat and stress.  Your health care provider or certified diabetes educator can help you make a plan for the type and frequency of exercise (activity plan) that works for you.  When you start a new exercise or activity, work with your health care provider to make sure the activity is safe for you, and to adjust your insulin, medicines, or food intake as needed. This information is not intended to replace advice given to you by your health care provider. Make sure you discuss any questions you have with your health care provider. Document Released: 01/23/2004 Document Revised: 05/13/2017 Document Reviewed: 04/13/2016 Elsevier Interactive Patient Education  2019 [employer]  

## 2019-03-13 DIAGNOSIS — B351 Tinea unguium: Secondary | ICD-10-CM | POA: Diagnosis not present

## 2019-03-13 DIAGNOSIS — L608 Other nail disorders: Secondary | ICD-10-CM | POA: Diagnosis not present

## 2019-03-13 DIAGNOSIS — L600 Ingrowing nail: Secondary | ICD-10-CM | POA: Diagnosis not present

## 2019-04-07 DIAGNOSIS — E119 Type 2 diabetes mellitus without complications: Secondary | ICD-10-CM | POA: Diagnosis not present

## 2019-04-07 LAB — HM DIABETES EYE EXAM

## 2019-04-11 ENCOUNTER — Telehealth: Payer: Self-pay | Admitting: Physician Assistant

## 2019-04-11 DIAGNOSIS — G4700 Insomnia, unspecified: Secondary | ICD-10-CM

## 2019-04-11 MED ORDER — TEMAZEPAM 15 MG PO CAPS: 15.0000 mg | ORAL_CAPSULE | Freq: Every day | ORAL | 1 refills | Status: DC

## 2019-04-11 NOTE — Telephone Encounter (Signed)
refilled 

## 2019-04-11 NOTE — Telephone Encounter (Signed)
Pt came in office requesting refill of the following medications:  temazepam (RESTORIL) 15 MG capsule   [facility] [address]  Last Rx: 01/09/2019 with 1 refill LOV: 01/09/2019 Pt was advised that Tawanna Sat is out of the office until this afternoon. Please advise. Thanks TNP

## 2019-04-11 NOTE — Telephone Encounter (Signed)
Please review

## 2019-04-14 ENCOUNTER — Encounter: Payer: Self-pay | Admitting: Physician Assistant

## 2019-05-08 ENCOUNTER — Ambulatory Visit (INDEPENDENT_AMBULATORY_CARE_PROVIDER_SITE_OTHER): Payer: Medicare Other | Admitting: Physician Assistant

## 2019-05-08 ENCOUNTER — Ambulatory Visit (INDEPENDENT_AMBULATORY_CARE_PROVIDER_SITE_OTHER): Payer: Medicare Other

## 2019-05-08 ENCOUNTER — Other Ambulatory Visit: Payer: Self-pay

## 2019-05-08 ENCOUNTER — Encounter: Payer: Self-pay | Admitting: Physician Assistant

## 2019-05-08 VITALS — BP 137/74 | HR 85 | Temp 98.1°F | Resp 16 | Wt 137.6 lb

## 2019-05-08 DIAGNOSIS — E7800 Pure hypercholesterolemia, unspecified: Secondary | ICD-10-CM | POA: Diagnosis not present

## 2019-05-08 DIAGNOSIS — R350 Frequency of micturition: Secondary | ICD-10-CM | POA: Diagnosis not present

## 2019-05-08 DIAGNOSIS — E119 Type 2 diabetes mellitus without complications: Secondary | ICD-10-CM

## 2019-05-08 DIAGNOSIS — Z0000 Encounter for general adult medical examination without abnormal findings: Secondary | ICD-10-CM

## 2019-05-08 DIAGNOSIS — I10 Essential (primary) hypertension: Secondary | ICD-10-CM | POA: Diagnosis not present

## 2019-05-08 DIAGNOSIS — F5101 Primary insomnia: Secondary | ICD-10-CM | POA: Diagnosis not present

## 2019-05-08 LAB — POCT GLYCOSYLATED HEMOGLOBIN (HGB A1C)
Est. average glucose Bld gHb Est-mCnc: 134
Hemoglobin A1C: 6.3 % — AB (ref 4.0–5.6)

## 2019-05-08 MED ORDER — GLIPIZIDE ER 10 MG PO TB24: 10.0000 mg | ORAL_TABLET | ORAL | 3 refills | Status: DC

## 2019-05-08 MED ORDER — ATORVASTATIN CALCIUM 40 MG PO TABS: 40.0000 mg | ORAL_TABLET | Freq: Every day | ORAL | 3 refills | Status: DC

## 2019-05-08 MED ORDER — HYDROCHLOROTHIAZIDE 12.5 MG PO TABS: 12.5000 mg | ORAL_TABLET | ORAL | 3 refills | Status: DC

## 2019-05-08 MED ORDER — AMLODIPINE BESYLATE 10 MG PO TABS: 10.0000 mg | ORAL_TABLET | Freq: Every day | ORAL | 3 refills | Status: DC

## 2019-05-08 MED ORDER — DOXEPIN HCL 3 MG PO TABS: 3.0000 mg | ORAL_TABLET | Freq: Every evening | ORAL | 0 refills | Status: DC | PRN

## 2019-05-08 MED ORDER — OXYBUTYNIN CHLORIDE 5 MG PO TABS: 5.0000 mg | ORAL_TABLET | Freq: Every day | ORAL | 3 refills | Status: DC

## 2019-05-08 NOTE — Patient Instructions (Signed)
Health Maintenance After Age 83 After age 83, you are at a higher risk for certain long-term diseases and infections as well as injuries from falls. Falls are a major cause of broken bones and head injuries in people who are older than age 83. Getting regular preventive care can help to keep you healthy and well. Preventive care includes getting regular testing and making lifestyle changes as recommended by your health care provider. Talk with your health care provider about:  Which screenings and tests you should have. A screening is a test that checks for a disease when you have no symptoms.  A diet and exercise plan that is right for you. What should I know about screenings and tests to prevent falls? Screening and testing are the best ways to find a health problem early. Early diagnosis and treatment give you the best chance of managing medical conditions that are common after age 83. Certain conditions and lifestyle choices may make you more likely to have a fall. Your health care provider may recommend:  Regular vision checks. Poor vision and conditions such as cataracts can make you more likely to have a fall. If you wear glasses, make sure to get your prescription updated if your vision changes.  Medicine review. Work with your health care provider to regularly review all of the medicines you are taking, including over-the-counter medicines. Ask your health care provider about any side effects that may make you more likely to have a fall. Tell your health care provider if any medicines that you take make you feel dizzy or sleepy.  Osteoporosis screening. Osteoporosis is a condition that causes the bones to get weaker. This can make the bones weak and cause them to break more easily.  Blood pressure screening. Blood pressure changes and medicines to control blood pressure can make you feel dizzy.  Strength and balance checks. Your health care provider may recommend certain tests to check your  strength and balance while standing, walking, or changing positions.  Foot health exam. Foot pain and numbness, as well as not wearing proper footwear, can make you more likely to have a fall.  Depression screening. You may be more likely to have a fall if you have a fear of falling, feel emotionally low, or feel unable to do activities that you used to do.  Alcohol use screening. Using too much alcohol can affect your balance and may make you more likely to have a fall. What actions can I take to lower my risk of falls? General instructions  Talk with your health care provider about your risks for falling. Tell your health care provider if: ? You fall. Be sure to tell your health care provider about all falls, even ones that seem minor. ? You feel dizzy, sleepy, or off-balance.  Take over-the-counter and prescription medicines only as told by your health care provider. These include any supplements.  Eat a healthy diet and maintain a healthy weight. A healthy diet includes low-fat dairy products, low-fat (lean) meats, and fiber from whole grains, beans, and lots of fruits and vegetables. Home safety  Remove any tripping hazards, such as rugs, cords, and clutter.  Install safety equipment such as grab bars in bathrooms and safety rails on stairs.  Keep rooms and walkways well-lit. Activity   Follow a regular exercise program to stay fit. This will help you maintain your balance. Ask your health care provider what types of exercise are appropriate for you.  If you need a cane or   walker, use it as recommended by your health care provider.  Wear supportive shoes that have nonskid soles. Lifestyle  Do not drink alcohol if your health care provider tells you not to drink.  If you drink alcohol, limit how much you have: ? 0-1 drink a day for women. ? 0-2 drinks a day for men.  Be aware of how much alcohol is in your drink. In the [place] one drink equals one typical bottle of beer (12  oz), one-half glass of wine (5 oz), or one shot of hard liquor (1 oz).  Do not use any products that contain nicotine or tobacco, such as cigarettes and e-cigarettes. If you need help quitting, ask your health care provider. Summary  Having a healthy lifestyle and getting preventive care can help to protect your health and wellness after age 83.  Screening and testing are the best way to find a health problem early and help you avoid having a fall. Early diagnosis and treatment give you the best chance for managing medical conditions that are more common for people who are older than age 83.  Falls are a major cause of broken bones and head injuries in people who are older than age 83. Take precautions to prevent a fall at home.  Work with your health care provider to learn what changes you can make to improve your health and wellness and to prevent falls. This information is not intended to replace advice given to you by your health care provider. Make sure you discuss any questions you have with your health care provider. Document Released: 09/15/2017 Document Revised: 09/15/2017 Document Reviewed: 09/15/2017 Elsevier Interactive Patient Education  2019 [employer]  

## 2019-05-08 NOTE — Patient Instructions (Signed)
[name] , Thank you for taking time to come for your Medicare Wellness Visit. I appreciate your ongoing commitment to your health goals. Please review the following plan we discussed and let me know if I can assist you in the future.   Screening recommendations/referrals: Colonoscopy: No longer required.  Mammogram: No longer required.  Bone Density: Pt declines order today or in the future.  Recommended yearly ophthalmology/optometry visit for glaucoma screening and checkup Recommended yearly dental visit for hygiene and checkup  Vaccinations: Influenza vaccine: Up to date Pneumococcal vaccine: Completed series Tdap vaccine: Pt declines today.  Shingles vaccine: Pt declines today.     Advanced directives: Please bring a copy of your POA (Power of Attorney) and/or Living Will to your next appointment.   Conditions/risks identified: Continue to increase fruits and vegetables in daily diet to two serving of each.   Next appointment: 2:00 PM today with [name].   Preventive Care 40 Years and Older, Female Preventive care refers to lifestyle choices and visits with your health care provider that can promote health and wellness. What does preventive care include?  A yearly physical exam. This is also called an annual well check.  Dental exams once or twice a year.  Routine eye exams. Ask your health care provider how often you should have your eyes checked.  Personal lifestyle choices, including:  Daily care of your teeth and gums.  Regular physical activity.  Eating a healthy diet.  Avoiding tobacco and drug use.  Limiting alcohol use.  Practicing safe sex.  Taking low-dose aspirin every day.  Taking vitamin and mineral supplements as recommended by your health care provider. What happens during an annual well check? The services and screenings done by your health care provider during your annual well check will depend on your age, overall health, lifestyle  risk factors, and family history of disease. Counseling  Your health care provider may ask you questions about your:  Alcohol use.  Tobacco use.  Drug use.  Emotional well-being.  Home and relationship well-being.  Sexual activity.  Eating habits.  History of falls.  Memory and ability to understand (cognition).  Work and work Statistician.  Reproductive health. Screening  You may have the following tests or measurements:  Height, weight, and BMI.  Blood pressure.  Lipid and cholesterol levels. These may be checked every 5 years, or more frequently if you are over 57 years old.  Skin check.  Lung cancer screening. You may have this screening every year starting at age 52 if you have a 30-pack-year history of smoking and currently smoke or have quit within the past 15 years.  Fecal occult blood test (FOBT) of the stool. You may have this test every year starting at age 3.  Flexible sigmoidoscopy or colonoscopy. You may have a sigmoidoscopy every 5 years or a colonoscopy every 10 years starting at age 68.  Hepatitis C blood test.  Hepatitis B blood test.  Sexually transmitted disease (STD) testing.  Diabetes screening. This is done by checking your blood sugar (glucose) after you have not eaten for a while (fasting). You may have this done every 1-3 years.  Bone density scan. This is done to screen for osteoporosis. You may have this done starting at age 59.  Mammogram. This may be done every 1-2 years. Talk to your health care provider about how often you should have regular mammograms. Talk with your health care provider about your test results, treatment options, and if necessary, the  need for more tests. Vaccines  Your health care provider may recommend certain vaccines, such as:  Influenza vaccine. This is recommended every year.  Tetanus, diphtheria, and acellular pertussis (Tdap, Td) vaccine. You may need a Td booster every 10 years.  Zoster vaccine.  You may need this after age 9.  Pneumococcal 13-valent conjugate (PCV13) vaccine. One dose is recommended after age 21.  Pneumococcal polysaccharide (PPSV23) vaccine. One dose is recommended after age 67. Talk to your health care provider about which screenings and vaccines you need and how often you need them. This information is not intended to replace advice given to you by your health care provider. Make sure you discuss any questions you have with your health care provider. Document Released: 11/29/2015 Document Revised: 07/22/2016 Document Reviewed: 09/03/2015 Elsevier Interactive Patient Education  2017 [place] Prevention in the Home Falls can cause injuries. They can happen to people of all ages. There are many things you can do to make your home safe and to help prevent falls. What can I do on the outside of my home?  Regularly fix the edges of walkways and driveways and fix any cracks.  Remove anything that might make you trip as you walk through a door, such as a raised step or threshold.  Trim any bushes or trees on the path to your home.  Use bright outdoor lighting.  Clear any walking paths of anything that might make someone trip, such as rocks or tools.  Regularly check to see if handrails are loose or broken. Make sure that both sides of any steps have handrails.  Any raised decks and porches should have guardrails on the edges.  Have any leaves, snow, or ice cleared regularly.  Use sand or salt on walking paths during winter.  Clean up any spills in your garage right away. This includes oil or grease spills. What can I do in the bathroom?  Use night lights.  Install grab bars by the toilet and in the tub and shower. Do not use towel bars as grab bars.  Use non-skid mats or decals in the tub or shower.  If you need to sit down in the shower, use a plastic, non-slip stool.  Keep the floor dry. Clean up any water that spills on the floor as soon  as it happens.  Remove soap buildup in the tub or shower regularly.  Attach bath mats securely with double-sided non-slip rug tape.  Do not have throw rugs and other things on the floor that can make you trip. What can I do in the bedroom?  Use night lights.  Make sure that you have a light by your bed that is easy to reach.  Do not use any sheets or blankets that are too big for your bed. They should not hang down onto the floor.  Have a firm chair that has side arms. You can use this for support while you get dressed.  Do not have throw rugs and other things on the floor that can make you trip. What can I do in the kitchen?  Clean up any spills right away.  Avoid walking on wet floors.  Keep items that you use a lot in easy-to-reach places.  If you need to reach something above you, use a strong step stool that has a grab bar.  Keep electrical cords out of the way.  Do not use floor polish or wax that makes floors slippery. If you must use wax,  use non-skid floor wax.  Do not have throw rugs and other things on the floor that can make you trip. What can I do with my stairs?  Do not leave any items on the stairs.  Make sure that there are handrails on both sides of the stairs and use them. Fix handrails that are broken or loose. Make sure that handrails are as long as the stairways.  Check any carpeting to make sure that it is firmly attached to the stairs. Fix any carpet that is loose or worn.  Avoid having throw rugs at the top or bottom of the stairs. If you do have throw rugs, attach them to the floor with carpet tape.  Make sure that you have a light switch at the top of the stairs and the bottom of the stairs. If you do not have them, ask someone to add them for you. What else can I do to help prevent falls?  Wear shoes that:  Do not have high heels.  Have rubber bottoms.  Are comfortable and fit you well.  Are closed at the toe. Do not wear sandals.  If  you use a stepladder:  Make sure that it is fully opened. Do not climb a closed stepladder.  Make sure that both sides of the stepladder are locked into place.  Ask someone to hold it for you, if possible.  Clearly mark and make sure that you can see:  Any grab bars or handrails.  First and last steps.  Where the edge of each step is.  Use tools that help you move around (mobility aids) if they are needed. These include:  Canes.  Walkers.  Scooters.  Crutches.  Turn on the lights when you go into a dark area. Replace any light bulbs as soon as they burn out.  Set up your furniture so you have a clear path. Avoid moving your furniture around.  If any of your floors are uneven, fix them.  If there are any pets around you, be aware of where they are.  Review your medicines with your doctor. Some medicines can make you feel dizzy. This can increase your chance of falling. Ask your doctor what other things that you can do to help prevent falls. This information is not intended to replace advice given to you by your health care provider. Make sure you discuss any questions you have with your health care provider. Document Released: 08/29/2009 Document Revised: 04/09/2016 Document Reviewed: 12/07/2014 Elsevier Interactive Patient Education  2017 [employer].

## 2019-05-08 NOTE — Progress Notes (Signed)
Subjective:   TEKEISHA Schneider is a 85 y.o. female who presents for Medicare Annual (Subsequent) preventive examination.    This visit is being conducted through telemedicine due to the COVID-19 pandemic. This patient has given me verbal consent via doximity to conduct this visit, patient states they are participating from their home address. Some vital signs may be absent or patient reported.    Patient identification: identified by name, DOB, and current address  Review of Systems:  N/A  Cardiac Risk Factors include: advanced age (>37men, >72 women);dyslipidemia;hypertension;obesity (BMI >30kg/m2)     Objective:     Vitals: There were no vitals taken for this visit.  There is no height or weight on file to calculate BMI. Unable to obtain vitals due to visit being conducted via telephonically.   Advanced Directives 05/08/2019 04/28/2018 04/20/2017 11/02/2016 04/15/2016 08/05/2015  Does Patient Have a Medical Advance Directive? Yes No Yes Yes Yes Yes  Type of Paramedic of [place];Living will - Living will;Healthcare Power of [place];Living will [place];Living will Living will  Copy of [place] in Chart? No - copy requested - No - copy requested - - -  Would patient like information on creating a medical advance directive? - No - Patient declined - - - -    Tobacco Social History   Tobacco Use  Smoking Status Former Smoker  . Packs/day: 0.50  . Quit date: 11/15/1976  . Years since quitting: 42.5  Smokeless Tobacco Never Used     Counseling given: Not Answered   Clinical Intake:  Pre-visit preparation completed: Yes  Pain : No/denies pain Pain Score: 0-No pain     Nutritional Risks: None Diabetes: Yes  How often do you need to have someone help you when you read instructions, pamphlets, or other written materials from your doctor or pharmacy?: 1 - Never   Diabetes:  Is  the patient diabetic?  Yes type 2 If diabetic, was a CBG obtained today?  No  Did the patient bring in their glucometer from home?  No  How often do you monitor your CBG's? Once a week.   Financial Strains and Diabetes Management:  Are you having any financial strains with the device, your supplies or your medication? No .  Does the patient want to be seen by Chronic Care Management for management of their diabetes?  No  Would the patient like to be referred to a Nutritionist or for Diabetic Management?  No   Diabetic Exams:  Diabetic Eye Exam: Completed 04/07/19. Repeat yearly.  Diabetic Foot Exam: Completed 01/09/19. Repeat yearly.  Interpreter Needed?: No  Information entered by :: [facility], LPN  Past Medical History:  Diagnosis Date  . Cancer ([place])    skin  . Diabetes mellitus without complication ([place])    type 2  . Hyperlipidemia   . Hypertension   . IBS (irritable bowel syndrome)    Past Surgical History:  Procedure Laterality Date  . BREAST BIOPSY Right    neg  . BREAST SURGERY     1980's  . CAROTID ARTERY ANGIOPLASTY Left 01/18/2015   Dr. Delana Meyer [facility]  . CHOLECYSTECTOMY  06/27/2012  . DECOMPRESSIVE LUMBAR LAMINECTOMY LEVEL 1  1950  . EYE SURGERY Right 2012   cataract extraction   Family History  Problem Relation Age of Onset  . Ovarian cancer Mother   . Heart disease Brother   . Heart attack Brother   . Gout  Brother   . Alzheimer's disease Brother   . Dementia Brother   . Heart attack Brother   . Diabetes Daughter   . Breast cancer Neg Hx    Social History   Socioeconomic History  . Marital status: Widowed    Spouse name: Not on file  . Number of children: 2  . Years of education: Not on file  . Highest education level: 12th grade  Occupational History  . Occupation: retired  Scientific laboratory technician  . Financial resource strain: Not hard at all  . Food insecurity    Worry: Never true    Inability: Never true  . Transportation needs    Medical: No     Non-medical: No  Tobacco Use  . Smoking status: Former Smoker    Packs/day: 0.50    Quit date: 11/15/1976    Years since quitting: 42.5  . Smokeless tobacco: Never Used  Substance and Sexual Activity  . Alcohol use: No  . Drug use: No  . Sexual activity: Not on file  Lifestyle  . Physical activity    Days per week: 0 days    Minutes per session: 0 min  . Stress: Not at all  Relationships  . Social Herbalist on phone: Patient refused    Gets together: Patient refused    Attends religious service: Patient refused    Active member of club or organization: Patient refused    Attends meetings of clubs or organizations: Patient refused    Relationship status: Patient refused  Other Topics Concern  . Not on file  Social History Narrative  . Not on file    Outpatient Encounter Medications as of 05/08/2019  Medication Sig  . amLODipine (NORVASC) 10 MG tablet Take 1 tablet (10 mg total) by mouth daily.  Marland Kitchen aspirin EC 81 MG tablet Take 81 mg by mouth daily.   Marland Kitchen atorvastatin (LIPITOR) 40 MG tablet Take 1 tablet (40 mg total) by mouth daily at 6 PM.  . cholecalciferol (VITAMIN D) 1000 UNITS tablet Take 1,000 Units by mouth daily.   Marland Kitchen erythromycin ophthalmic ointment Place 1 application into both eyes once. Reported on 03/25/2016  . glipiZIDE (GLUCOTROL XL) 10 MG 24 hr tablet Take 1 tablet (10 mg total) by mouth every other day.  Marland Kitchen glucose blood (ONE TOUCH ULTRA TEST) test strip Check blood glucose once daily  . hydrochlorothiazide (HYDRODIURIL) 12.5 MG tablet Take 1 tablet (12.5 mg total) by mouth every other day.  . OMEGA-3 FATTY ACIDS PO Take 1 capsule by mouth daily.   Marland Kitchen oxybutynin (DITROPAN) 5 MG tablet Take 1 tablet (5 mg total) by mouth 2 (two) times daily. (Patient taking differently: Take 5 mg by mouth daily. )  . polyethylene glycol (MIRALAX / GLYCOLAX) packet Take 17 g by mouth daily.  . temazepam (RESTORIL) 15 MG capsule Take 1 capsule (15 mg total) by mouth at  bedtime.  . triamcinolone cream (KENALOG) 0.1 % Apply 1 application topically 2 (two) times daily.   No facility-administered encounter medications on file as of 05/08/2019.     Activities of Daily Living In your present state of health, do you have any difficulty performing the following activities: 05/08/2019  Hearing? N  Vision? N  Comment Wears eye glasses daily.  Difficulty concentrating or making decisions? N  Walking or climbing stairs? N  Dressing or bathing? N  Doing errands, shopping? N  Preparing Food and eating ? N  Using the Toilet? N  In  the past six months, have you accidently leaked urine? N  Do you have problems with loss of bowel control? N  Managing your Medications? N  Managing your Finances? N  Housekeeping or managing your Housekeeping? N  Some recent data might be hidden    Patient Care Team: Rubye Beach as PCP - General (Family Medicine) Eulogio Bear, MD as Consulting Physician (Ophthalmology) Ubaldo Glassing Javier Docker, MD as Consulting Physician (Cardiology) Elvina Mattes, Adele Schilder as Attending Physician (Podiatry) Dasher, Rayvon Char, MD (Dermatology)    Assessment:   This is a routine wellness examination for [place].  Exercise Activities and Dietary recommendations Current Exercise Habits: The patient does not participate in regular exercise at present, Exercise limited by: None identified  Goals    . DIET - EAT MORE FRUITS AND VEGETABLES     Recommend adding in 2 servings each of fruits and vegetables in daily diet.     . Increase water intake     Recommend increasing water intake to 6 glasses a day.       Fall Risk: Fall Risk  05/08/2019 01/09/2019 04/28/2018 04/20/2017 04/15/2016  Falls in the past year? 0 1 No No No  Comment - first of January - - -  Number falls in past yr: - 0 - - -  Injury with Fall? - 0 - - -    FALL RISK PREVENTION PERTAINING TO THE HOME:  Any stairs in or around the home? No  If so, are there any without  handrails? N/A  Home free of loose throw rugs in walkways, pet beds, electrical cords, etc? Yes  Adequate lighting in your home to reduce risk of falls? Yes   ASSISTIVE DEVICES UTILIZED TO PREVENT FALLS:  Life alert? No  Use of a cane, walker or w/c? No  Grab bars in the bathroom? No  Shower chair or bench in shower? No  Elevated toilet seat or a handicapped toilet? No   TIMED UP AND GO:  Was the test performed? No .    Depression Screen PHQ 2/9 Scores 05/08/2019 04/28/2018 04/20/2017 04/20/2017  PHQ - 2 Score 0 0 0 0  PHQ- 9 Score - - 3 -     Cognitive Function: Declined today.     6CIT Screen 04/20/2017  What Year? 0 points  What month? 0 points  What time? 0 points  Count back from 20 0 points  Months in reverse 0 points  Repeat phrase 2 points  Total Score 2    Immunization History  Administered Date(s) Administered  . Influenza, High Dose Seasonal PF 09/05/2015  . Influenza,inj,Quad PF,6+ Mos 08/14/2013  . Influenza-Unspecified 08/31/2017, 08/08/2018  . Pneumococcal Conjugate-13 04/02/2014  . Pneumococcal Polysaccharide-23 12/08/2004  . Zoster 03/03/2010    Qualifies for Shingles Vaccine? Yes  Zostavax completed 02/21/10. Due for Shingrix. Education has been provided regarding the importance of this vaccine. Pt has been advised to call insurance company to determine out of pocket expense. Advised may also receive vaccine at local pharmacy or Health Dept. Verbalized acceptance and understanding.  Tdap: Although this vaccine is not a covered service during a Wellness Exam, does the patient still wish to receive this vaccine today?  No .  Education has been provided regarding the importance of this vaccine. Advised may receive this vaccine at local pharmacy or Health Dept. Aware to provide a copy of the vaccination record if obtained from local pharmacy or Health Dept. Verbalized acceptance and understanding.  Flu Vaccine: Up to  date  Pneumococcal Vaccine: Up to date   Screening Tests Health Maintenance  Topic Date Due  . URINE MICROALBUMIN  04/20/2018  . TETANUS/TDAP  11/16/2026 (Originally 01/20/1948)  . DEXA SCAN  04/17/2031 (Originally 03/15/2014)  . INFLUENZA VACCINE  06/17/2019  . HEMOGLOBIN A1C  07/10/2019  . FOOT EXAM  01/10/2020  . OPHTHALMOLOGY EXAM  04/06/2020  . PNA vac Low Risk Adult  Completed    Cancer Screenings:  Colorectal Screening: No longer required.   Mammogram: No longer required.   Bone Density: Completed 03/15/12. Results reflect OSTEOPOROSIS. Repeat every 2 years. Declined receiving anymore DEXA scans.   Lung Cancer Screening: (Low Dose CT Chest recommended if Age 53-80 years, 30 pack-year currently smoking OR have quit w/in 15years.) does not qualify.   Additional Screening:  Dental Screening: Recommended annual dental exams for proper oral hygiene   Community Resource Referral:  CRR required this visit?  No       Plan:  I have personally reviewed and addressed the Medicare Annual Wellness questionnaire and have noted the following in the patient's chart:  A. Medical and social history B. Use of alcohol, tobacco or illicit drugs  C. Current medications and supplements D. Functional ability and status E.  Nutritional status F.  Physical activity G. Advance directives H. List of other physicians I.  Hospitalizations, surgeries, and ER visits in previous 12 months J.  [place] such as hearing and vision if needed, cognitive and depression L. Referrals and appointments   In addition, I have reviewed and discussed with patient certain preventive protocols, quality metrics, and best practice recommendations. A written personalized care plan for preventive services as well as general preventive health recommendations were provided to patient.   Glendora Score, [place]  [id number]/44/9675 Nurse Health Advisor   Nurse Notes: Pt needs a urine check at today's in office visit. Pt declined a future  order for a DEXA scan and tetanus vaccine.

## 2019-05-08 NOTE — Progress Notes (Signed)
Patient: [name] Female    DOB: [birth year]   85 y.o.   MRN: [id number] Visit Date: 05/08/2019  Today's Provider: Mar Daring, PA-C   Chief Complaint  Patient presents with  . Follow-up   Subjective:    I,Joseline E. Rosas,RMA am acting as a Education administrator for [employer], PA-C.  Patient had AWV with [facility] today  HPI  HTN: Stable.Continue amlodipine 10mg  and HCTZ 12.5mg   Diabetes: A1c up slightly to 6.6 from 6.3 3-4 months ago. Continue glipizide every other day at this time since BM are better at this dosing. Per patient she checks sugar levels off an on.   Lab Results  Component Value Date   HGBA1C 6.3 (A) 05/08/2019    Allergies  Allergen Reactions  . Cephalosporins   . Codeine   . Macrolides And Ketolides   . Penicillins   . Tramadol Nausea Only  . Doxycycline Rash  . Sulfa Antibiotics Rash     Current Outpatient Medications:  .  amLODipine (NORVASC) 10 MG tablet, Take 1 tablet (10 mg total) by mouth daily., Disp: 90 tablet, Rfl: 3 .  aspirin EC 81 MG tablet, Take 81 mg by mouth daily. , Disp: , Rfl:  .  atorvastatin (LIPITOR) 40 MG tablet, Take 1 tablet (40 mg total) by mouth daily at 6 PM., Disp: 90 tablet, Rfl: 3 .  cholecalciferol (VITAMIN D) 1000 UNITS tablet, Take 1,000 Units by mouth daily. , Disp: , Rfl:  .  erythromycin ophthalmic ointment, Place 1 application into both eyes once. Reported on 03/25/2016, Disp: , Rfl:  .  glipiZIDE (GLUCOTROL XL) 10 MG 24 hr tablet, Take 1 tablet (10 mg total) by mouth every other day., Disp: 45 tablet, Rfl: 3 .  glucose blood (ONE TOUCH ULTRA TEST) test strip, Check blood glucose once daily, Disp: 100 each, Rfl: 12 .  hydrochlorothiazide (HYDRODIURIL) 12.5 MG tablet, Take 1 tablet (12.5 mg total) by mouth every other day., Disp: 90 tablet, Rfl: 3 .  OMEGA-3 FATTY ACIDS PO, Take 1 capsule by mouth daily. , Disp: , Rfl:  .  oxybutynin (DITROPAN) 5 MG tablet, Take 1 tablet (5 mg total) by mouth 2 (two)  times daily. (Patient taking differently: Take 5 mg by mouth daily. ), Disp: 180 tablet, Rfl: 3 .  polyethylene glycol (MIRALAX / GLYCOLAX) packet, Take 17 g by mouth daily., Disp: , Rfl:  .  temazepam (RESTORIL) 15 MG capsule, Take 1 capsule (15 mg total) by mouth at bedtime., Disp: 90 capsule, Rfl: 1 .  triamcinolone cream (KENALOG) 0.1 %, Apply 1 application topically 2 (two) times daily., Disp: 30 g, Rfl: 0  Review of Systems  Constitutional: Negative.   Respiratory: Negative.   Cardiovascular: Negative.   Endocrine: Negative for polydipsia, polyphagia and polyuria.  Neurological: Negative.     Social History   Tobacco Use  . Smoking status: Former Smoker    Packs/day: 0.50    Quit date: 11/15/1976    Years since quitting: 42.5  . Smokeless tobacco: Never Used  Substance Use Topics  . Alcohol use: No      Objective:   BP 137/74 (BP Location: Left Arm, Patient Position: Sitting, Cuff Size: Normal)   Pulse 85   Temp 98.1 F (36.7 C) (Oral)   Resp 16   Wt 137 lb 9.6 oz (62.4 kg)   BMI 25.17 kg/m  Vitals:   05/08/19 1425  BP: 137/74  Pulse: 85  Resp: 16  Temp: 98.1 F (36.7 C)  TempSrc: Oral  Weight: 137 lb 9.6 oz (62.4 kg)     Physical Exam Vitals signs reviewed.  Constitutional:      General: She is not in acute distress.    Appearance: Normal appearance. She is well-developed. She is not ill-appearing or diaphoretic.  HENT:     Head: Normocephalic and atraumatic.     Right Ear: Tympanic membrane, ear canal and external ear normal.     Left Ear: Tympanic membrane, ear canal and external ear normal.     Nose: Nose normal.     Mouth/Throat:     Mouth: Mucous membranes are moist.     Pharynx: No oropharyngeal exudate.  Eyes:     General: No scleral icterus.       Right eye: No discharge.        Left eye: No discharge.     Conjunctiva/sclera: Conjunctivae normal.     Pupils: Pupils are equal, round, and reactive to light.  Neck:     Musculoskeletal:  Normal range of motion and neck supple.     Thyroid: No thyromegaly.     Vascular: No JVD.     Trachea: No tracheal deviation.  Cardiovascular:     Rate and Rhythm: Normal rate and regular rhythm.     Pulses: Normal pulses.     Heart sounds: Normal heart sounds. No murmur. No friction rub. No gallop.   Pulmonary:     Effort: Pulmonary effort is normal. No respiratory distress.     Breath sounds: Normal breath sounds. No wheezing or rales.  Chest:     Chest wall: No tenderness.  Abdominal:     General: Bowel sounds are normal. There is no distension.     Palpations: Abdomen is soft. There is no mass.     Tenderness: There is no abdominal tenderness. There is no guarding or rebound.  Musculoskeletal: Normal range of motion.        General: No tenderness.  Lymphadenopathy:     Cervical: No cervical adenopathy.  Skin:    General: Skin is warm and dry.     Capillary Refill: Capillary refill takes less than 2 seconds.     Findings: No rash.  Neurological:     General: No focal deficit present.     Mental Status: She is alert and oriented to person, place, and time. Mental status is at baseline.     Cranial Nerves: No cranial nerve deficit.     Motor: No weakness.     Gait: Gait normal.  Psychiatric:        Behavior: Behavior normal.        Thought Content: Thought content normal.        Judgment: Judgment normal.         Assessment & Plan    1. Diabetes mellitus without complication (HCC) W2B improved back to 6.3. Continue Glipizide XR 10mg  every other day. Will check labs as below and f/u pending results. - POCT glycosylated hemoglobin (Hb A1C) - glipiZIDE (GLUCOTROL XL) 10 MG 24 hr tablet; Take 1 tablet (10 mg total) by mouth every other day.  Dispense: 45 tablet; Refill: 3 - CBC w/Diff/Platelet - Comprehensive Metabolic Panel (CMET) - Lipid Profile  2. Primary insomnia Worsening despite Temazepam 15mg . Has been on for a long time. Will change to Silenor as below. She  is to call if worsening or not sleeping well.  - Doxepin HCl 3 MG TABS; Take 1 tablet (  3 mg total) by mouth at bedtime as needed.  Dispense: 30 tablet; Refill: 0 - CBC w/Diff/Platelet - Comprehensive Metabolic Panel (CMET) - Lipid Profile  3. Essential hypertension Stable. Diagnosis pulled for medication refill. Continue current medical treatment plan. Will check labs as below and f/u pending results. - amLODipine (NORVASC) 10 MG tablet; Take 1 tablet (10 mg total) by mouth daily.  Dispense: 90 tablet; Refill: 3 - hydrochlorothiazide (HYDRODIURIL) 12.5 MG tablet; Take 1 tablet (12.5 mg total) by mouth every other day.  Dispense: 90 tablet; Refill: 3 - CBC w/Diff/Platelet - Comprehensive Metabolic Panel (CMET) - Lipid Profile  4. Pure hypercholesterolemia Stable. Diagnosis pulled for medication refill. Continue current medical treatment plan. Will check labs as below and f/u pending results. - atorvastatin (LIPITOR) 40 MG tablet; Take 1 tablet (40 mg total) by mouth daily at 6 PM.  Dispense: 90 tablet; Refill: 3 - CBC w/Diff/Platelet - Comprehensive Metabolic Panel (CMET) - Lipid Profile  5. FOM (frequency of micturition) Stable. Diagnosis pulled for medication refill. Continue current medical treatment plan. - oxybutynin (DITROPAN) 5 MG tablet; Take 1 tablet (5 mg total) by mouth daily.  Dispense: 90 tablet; Refill: 3 - Comprehensive Metabolic Panel (CMET)     Mar Daring, PA-C  [facility]

## 2019-05-09 ENCOUNTER — Telehealth: Payer: Self-pay | Admitting: Physician Assistant

## 2019-05-09 LAB — CBC WITH DIFFERENTIAL/PLATELET
Basophils Absolute: 0 10*3/uL (ref 0.0–0.2)
Basos: 0 %
EOS (ABSOLUTE): 0.1 10*3/uL (ref 0.0–0.4)
Eos: 1 %
Hematocrit: 43.2 % (ref 34.0–46.6)
Hemoglobin: 14.9 g/dL (ref 11.1–15.9)
Immature Grans (Abs): 0 10*3/uL (ref 0.0–0.1)
Immature Granulocytes: 0 %
Lymphocytes Absolute: 2.2 10*3/uL (ref 0.7–3.1)
Lymphs: 32 %
MCH: 29.6 pg (ref 26.6–33.0)
MCHC: 34.5 g/dL (ref 31.5–35.7)
MCV: 86 fL (ref 79–97)
Monocytes Absolute: 0.4 10*3/uL (ref 0.1–0.9)
Monocytes: 6 %
Neutrophils Absolute: 4.2 10*3/uL (ref 1.4–7.0)
Neutrophils: 61 %
Platelets: 171 10*3/uL (ref 150–450)
RBC: 5.04 x10E6/uL (ref 3.77–5.28)
RDW: 12.7 % (ref 11.7–15.4)
WBC: 7 10*3/uL (ref 3.4–10.8)

## 2019-05-09 LAB — COMPREHENSIVE METABOLIC PANEL
ALT: 29 IU/L (ref 0–32)
AST: 28 IU/L (ref 0–40)
Albumin/Globulin Ratio: 1.8 (ref 1.2–2.2)
Albumin: 4.5 g/dL (ref 3.6–4.6)
Alkaline Phosphatase: 73 IU/L (ref 39–117)
BUN/Creatinine Ratio: 18 (ref 12–28)
BUN: 14 mg/dL (ref 8–27)
Bilirubin Total: 0.6 mg/dL (ref 0.0–1.2)
CO2: 23 mmol/L (ref 20–29)
Calcium: 10 mg/dL (ref 8.7–10.3)
Chloride: 103 mmol/L (ref 96–106)
Creatinine, Ser: 0.8 mg/dL (ref 0.57–1.00)
GFR calc Af Amer: 76 mL/min/{1.73_m2} (ref >59)
GFR calc non Af Amer: 66 mL/min/{1.73_m2} (ref >59)
Globulin, Total: 2.5 g/dL (ref 1.5–4.5)
Glucose: 190 mg/dL — ABNORMAL HIGH (ref 65–99)
Potassium: 3.5 mmol/L (ref 3.5–5.2)
Sodium: 144 mmol/L (ref 134–144)
Total Protein: 7 g/dL (ref 6.0–8.5)

## 2019-05-09 LAB — LIPID PANEL
Chol/HDL Ratio: 5 ratio — ABNORMAL HIGH (ref 0.0–4.4)
Cholesterol, Total: 223 mg/dL — ABNORMAL HIGH (ref 100–199)
HDL: 45 mg/dL (ref >39)
LDL Calculated: 123 mg/dL — ABNORMAL HIGH (ref 0–99)
Triglycerides: 277 mg/dL — ABNORMAL HIGH (ref 0–149)
VLDL Cholesterol Cal: 55 mg/dL — ABNORMAL HIGH (ref 5–40)

## 2019-05-09 NOTE — Telephone Encounter (Signed)
Spoke with pt to relay this message. Pt understood.  °TF °

## 2019-05-09 NOTE — Telephone Encounter (Signed)
-----   Message from Mar Daring, [place] sent at 05/09/2019  1:38 PM EDT ----- Blood count is normal. Kidney and liver function are normal. Cholesterol is up from last year. Make sure to be taking atorvastatin 40mg  daily as prescribed.

## 2019-05-30 DIAGNOSIS — D044 Carcinoma in situ of skin of scalp and neck: Secondary | ICD-10-CM | POA: Diagnosis not present

## 2019-05-30 DIAGNOSIS — D0471 Carcinoma in situ of skin of right lower limb, including hip: Secondary | ICD-10-CM | POA: Diagnosis not present

## 2019-07-03 ENCOUNTER — Other Ambulatory Visit: Payer: Self-pay | Admitting: Physician Assistant

## 2019-07-03 DIAGNOSIS — L299 Pruritus, unspecified: Secondary | ICD-10-CM

## 2019-07-03 DIAGNOSIS — F5101 Primary insomnia: Secondary | ICD-10-CM

## 2019-07-03 MED ORDER — TRIAMCINOLONE ACETONIDE 0.1 % EX CREA: 1.0000 "application " | TOPICAL_CREAM | Freq: Two times a day (BID) | CUTANEOUS | 0 refills | Status: DC

## 2019-07-03 MED ORDER — TEMAZEPAM 15 MG PO CAPS: 15.0000 mg | ORAL_CAPSULE | Freq: Every evening | ORAL | 5 refills | Status: DC | PRN

## 2019-07-03 NOTE — Telephone Encounter (Signed)
Refilled and changed back to temazepam

## 2019-07-03 NOTE — Telephone Encounter (Signed)
Patient is requesting a refill on the following medication  triamcinolone cream (KENALOG) 0.1 %   Patient also states that you put her on a new medication to help her sleep and it did not work so she would like for you to put her back on temazepam (RESTORIL) 15 MG capsule  She uses [facility] on [address].

## 2019-08-07 ENCOUNTER — Other Ambulatory Visit: Payer: Self-pay | Admitting: Physician Assistant

## 2019-08-07 DIAGNOSIS — F5101 Primary insomnia: Secondary | ICD-10-CM

## 2019-08-07 MED ORDER — TEMAZEPAM 15 MG PO CAPS: 15.0000 mg | ORAL_CAPSULE | Freq: Every evening | ORAL | 1 refills | Status: DC | PRN

## 2019-08-07 NOTE — Telephone Encounter (Signed)
Pt came in the office to request refill on the following medications:  temazepam (RESTORIL) 15 MG capsule  90 day supply  [facility] [address] Pt request a 90 day supply since her other medications were on a 90 day supply. Please advise. Thanks TNP

## 2019-09-07 NOTE — Progress Notes (Signed)
Patient: [name] Female    DOB: 17-May-1929   85 y.o.   MRN: [id number] Visit Date: 09/08/2019  Today's Provider: Mar Daring, PA-C   Chief Complaint  Patient presents with  . vaginal irritation   Subjective:     HPI  Patient with c/o vaginal irritation. Reports that it itches. She has tried Monistat and it helped a little. Denies vaginal discharge. States she cannot see her vaginal area to tell if it is red or irritated.   Influenza Vaccine: Pt reports that she got it at [place].  Allergies  Allergen Reactions  . Cephalosporins   . Codeine   . Macrolides And Ketolides   . Penicillins   . Tramadol Nausea Only  . Doxycycline Rash  . Sulfa Antibiotics Rash     Current Outpatient Medications:  .  amLODipine (NORVASC) 10 MG tablet, Take 1 tablet (10 mg total) by mouth daily., Disp: 90 tablet, Rfl: 3 .  aspirin EC 81 MG tablet, Take 81 mg by mouth daily. , Disp: , Rfl:  .  atorvastatin (LIPITOR) 40 MG tablet, Take 1 tablet (40 mg total) by mouth daily at 6 PM., Disp: 90 tablet, Rfl: 3 .  cholecalciferol (VITAMIN D) 1000 UNITS tablet, Take 1,000 Units by mouth daily. , Disp: , Rfl:  .  erythromycin ophthalmic ointment, Place 1 application into both eyes once. Reported on 03/25/2016, Disp: , Rfl:  .  glipiZIDE (GLUCOTROL XL) 10 MG 24 hr tablet, Take 1 tablet (10 mg total) by mouth every other day., Disp: 45 tablet, Rfl: 3 .  glucose blood (ONE TOUCH ULTRA TEST) test strip, Check blood glucose once daily, Disp: 100 each, Rfl: 12 .  hydrochlorothiazide (HYDRODIURIL) 12.5 MG tablet, Take 1 tablet (12.5 mg total) by mouth every other day., Disp: 90 tablet, Rfl: 3 .  OMEGA-3 FATTY ACIDS PO, Take 1 capsule by mouth daily. , Disp: , Rfl:  .  oxybutynin (DITROPAN) 5 MG tablet, Take 1 tablet (5 mg total) by mouth daily., Disp: 90 tablet, Rfl: 3 .  polyethylene glycol (MIRALAX / GLYCOLAX) packet, Take 17 g by mouth daily., Disp: , Rfl:  .  temazepam (RESTORIL)  15 MG capsule, Take 1 capsule (15 mg total) by mouth at bedtime as needed for sleep., Disp: 90 capsule, Rfl: 1 .  triamcinolone cream (KENALOG) 0.1 %, Apply 1 application topically 2 (two) times daily., Disp: 80 g, Rfl: 0  Review of Systems  Constitutional: Negative.   Respiratory: Negative.   Cardiovascular: Negative.   Gastrointestinal: Negative.   Genitourinary: Negative for dysuria, pelvic pain, vaginal bleeding, vaginal discharge and vaginal pain.       Vaginal itching  Musculoskeletal: Negative for back pain.    Social History   Tobacco Use  . Smoking status: Former Smoker    Packs/day: 0.50    Quit date: 11/15/1976    Years since quitting: 42.8  . Smokeless tobacco: Never Used  Substance Use Topics  . Alcohol use: No      Objective:   BP 135/79 (BP Location: Left Arm, Patient Position: Sitting, Cuff Size: Large)   Pulse 60   Temp (!) 96.7 F (35.9 C) (Other (Comment))   Resp 16   Wt 139 lb 6.4 oz (63.2 kg)   BMI 25.50 kg/m  Vitals:   09/08/19 0844  BP: 135/79  Pulse: 60  Resp: 16  Temp: (!) 96.7 F (35.9 C)  TempSrc: Other (Comment)  Weight: 139 lb 6.4  oz (63.2 kg)  Body mass index is 25.5 kg/m.   Physical Exam Vitals signs reviewed.  Constitutional:      General: She is not in acute distress.    Appearance: Normal appearance. She is well-developed. She is not ill-appearing.  HENT:     Head: Normocephalic and atraumatic.  Neck:     Musculoskeletal: Normal range of motion and neck supple.  Pulmonary:     Effort: Pulmonary effort is normal. No respiratory distress.  Genitourinary:    Comments: Patient declined Neurological:     Mental Status: She is alert.  Psychiatric:        Behavior: Behavior normal.        Thought Content: Thought content normal.        Judgment: Judgment normal.      No results found for any visits on 09/08/19.     Assessment & Plan    1. Yeast infection of the vagina Will give diflucan as below to see if this  helps symptoms. She is to call the office Monday to let me know if there were improvements. If not improving she is aware she will have to return and have a vaginal exam.  - fluconazole (DIFLUCAN) 150 MG tablet; Take 1 tablet (150 mg total) by mouth once for 1 dose.  Dispense: 1 tablet; Refill: 0     Mar Daring, PA-C  [facility]

## 2019-09-08 ENCOUNTER — Encounter: Payer: Self-pay | Admitting: Physician Assistant

## 2019-09-08 ENCOUNTER — Ambulatory Visit (INDEPENDENT_AMBULATORY_CARE_PROVIDER_SITE_OTHER): Payer: Medicare Other | Admitting: Physician Assistant

## 2019-09-08 ENCOUNTER — Other Ambulatory Visit: Payer: Self-pay

## 2019-09-08 VITALS — BP 135/79 | HR 60 | Temp 96.7°F | Resp 16 | Wt 139.4 lb

## 2019-09-08 DIAGNOSIS — B3731 Acute candidiasis of vulva and vagina: Secondary | ICD-10-CM

## 2019-09-08 DIAGNOSIS — B373 Candidiasis of vulva and vagina: Secondary | ICD-10-CM | POA: Diagnosis not present

## 2019-09-08 MED ORDER — FLUCONAZOLE 150 MG PO TABS: 150.0000 mg | ORAL_TABLET | Freq: Once | ORAL | 0 refills | Status: AC

## 2019-09-08 NOTE — Patient Instructions (Signed)

## 2019-09-11 ENCOUNTER — Telehealth: Payer: Self-pay | Admitting: Physician Assistant

## 2019-09-11 MED ORDER — FLUCONAZOLE 150 MG PO TABS: 150.0000 mg | ORAL_TABLET | Freq: Once | ORAL | 0 refills | Status: AC

## 2019-09-11 NOTE — Telephone Encounter (Signed)
She is not sure but she said it was one pill.  She uses [facility]  CB#  [phone]  [place]

## 2019-09-11 NOTE — Telephone Encounter (Signed)
Pt called saying she was told if her vaginal irratation was not better by the weekend that jenni would call in another rx

## 2019-09-11 NOTE — Telephone Encounter (Signed)
Yes if the diflucan did not work she is to come back to the office for a vaginal exam.   I sent the diflucan in Friday so that is what she should have taken.

## 2019-09-11 NOTE — Telephone Encounter (Signed)
Patient advised as below.  

## 2019-09-11 NOTE — Telephone Encounter (Signed)
Called and lvm on home phone for patient to call back regarding her message about her medication.

## 2019-09-11 NOTE — Telephone Encounter (Signed)
Patient advised as below. Patient requesting a second dose of diflucan to be sent to [place].

## 2019-09-11 NOTE — Telephone Encounter (Signed)
Sent in second dose

## 2019-09-18 ENCOUNTER — Other Ambulatory Visit: Payer: Self-pay

## 2019-09-18 ENCOUNTER — Encounter: Payer: Self-pay | Admitting: Physician Assistant

## 2019-09-18 ENCOUNTER — Other Ambulatory Visit (HOSPITAL_COMMUNITY)
Admission: RE | Admit: 2019-09-18 | Discharge: 2019-09-18 | Disposition: A | Discharge status: Home / Self Care | Payer: Medicare Other | Source: Ambulatory Visit | Attending: Physician Assistant | Admitting: Physician Assistant

## 2019-09-18 ENCOUNTER — Ambulatory Visit (INDEPENDENT_AMBULATORY_CARE_PROVIDER_SITE_OTHER): Payer: Medicare Other | Admitting: Physician Assistant

## 2019-09-18 VITALS — BP 145/79 | HR 78 | Temp 96.8°F | Wt 146.6 lb

## 2019-09-18 DIAGNOSIS — N898 Other specified noninflammatory disorders of vagina: Secondary | ICD-10-CM

## 2019-09-18 MED ORDER — VALACYCLOVIR HCL 1 G PO TABS: 1000.0000 mg | ORAL_TABLET | Freq: Two times a day (BID) | ORAL | 0 refills | Status: AC

## 2019-09-18 NOTE — Patient Instructions (Signed)
Vaginitis  Vaginitis is irritation and swelling (inflammation) of the vagina. It happens when normal bacteria and yeast in the vagina grow too much. There are many types of this condition. Treatment will depend on the type you have. Follow these instructions at home: Lifestyle  Keep your vagina area clean and dry. ? Avoid using soap. ? Rinse the area with water.  Do not do the following until your doctor says it is okay: ? Wash and clean out the vagina (douche). ? Use tampons. ? Have sex.  Wipe from front to back after going to the bathroom.  Let air reach your vagina. ? Wear cotton underwear. ? Do not wear: ? Underwear while you sleep. ? Tight pants. ? Thong underwear. ? Underwear or nylons without a cotton panel. ? Take off any wet clothing, such as bathing suits, as soon as possible.  Use gentle, non-scented products. Do not use things that can irritate the vagina, such as fabric softeners. Avoid the following products if they are scented: ? Feminine sprays. ? Detergents. ? Tampons. ? Feminine hygiene products. ? Soaps or bubble baths.  Practice safe sex and use condoms. General instructions  Take over-the-counter and prescription medicines only as told by your doctor.  If you were prescribed an antibiotic medicine, take or use it as told by your doctor. Do not stop taking or using the antibiotic even if you start to feel better.  Keep all follow-up visits as told by your doctor. This is important. Contact a doctor if:  You have pain in your belly.  You have a fever.  Your symptoms last for more than 2-3 days. Get help right away if:  You have a fever and your symptoms get worse all of a sudden. Summary  Vaginitis is irritation and swelling of the vagina. It can happen when the normal bacteria and yeast in the vagina grow too much. There are many types.  Treatment will depend on the type you have.  Do not douche, use tampons , or have sex until your health  care provider approves. When you can return to sex, practice safe sex and use condoms. This information is not intended to replace advice given to you by your health care provider. Make sure you discuss any questions you have with your health care provider. Document Released: 01/29/2009 Document Revised: 10/15/2017 Document Reviewed: 11/24/2016 Elsevier Patient Education  2020 Elsevier Inc.  

## 2019-09-18 NOTE — Progress Notes (Signed)
g      Patient: [name] Female    DOB: Sep 19, 1929   85 y.o.   MRN: [id number] Visit Date: 09/18/2019  Today's Provider: Trinna Post, PA-C   No chief complaint on file.  Subjective:     HPI Patient presents today for a possbile yeast infection. Patient states that she had 2 rounds of diflucan and it did not work. Patient states that Whitney Bushy PA-C wanted her to come in the office to get checked. She reports she has some burning irritation at the introitus and sometimes when she urinates. Denies vaginal discharge or bleeding. No new sexual partners in 37+ years. Denies fevers, chills, abdominal pain.    Allergies  Allergen Reactions  . Cephalosporins   . Codeine   . Macrolides And Ketolides   . Penicillins   . Tramadol Nausea Only  . Doxycycline Rash  . Sulfa Antibiotics Rash     Current Outpatient Medications:  .  amLODipine (NORVASC) 10 MG tablet, Take 1 tablet (10 mg total) by mouth daily., Disp: 90 tablet, Rfl: 3 .  aspirin EC 81 MG tablet, Take 81 mg by mouth daily. , Disp: , Rfl:  .  atorvastatin (LIPITOR) 40 MG tablet, Take 1 tablet (40 mg total) by mouth daily at 6 PM., Disp: 90 tablet, Rfl: 3 .  cholecalciferol (VITAMIN D) 1000 UNITS tablet, Take 1,000 Units by mouth daily. , Disp: , Rfl:  .  erythromycin ophthalmic ointment, Place 1 application into both eyes once. Reported on 03/25/2016, Disp: , Rfl:  .  glipiZIDE (GLUCOTROL XL) 10 MG 24 hr tablet, Take 1 tablet (10 mg total) by mouth every other day., Disp: 45 tablet, Rfl: 3 .  glucose blood (ONE TOUCH ULTRA TEST) test strip, Check blood glucose once daily, Disp: 100 each, Rfl: 12 .  hydrochlorothiazide (HYDRODIURIL) 12.5 MG tablet, Take 1 tablet (12.5 mg total) by mouth every other day., Disp: 90 tablet, Rfl: 3 .  OMEGA-3 FATTY ACIDS PO, Take 1 capsule by mouth daily. , Disp: , Rfl:  .  oxybutynin (DITROPAN) 5 MG tablet, Take 1 tablet (5 mg total) by mouth daily., Disp: 90 tablet, Rfl: 3 .   polyethylene glycol (MIRALAX / GLYCOLAX) packet, Take 17 g by mouth daily., Disp: , Rfl:  .  temazepam (RESTORIL) 15 MG capsule, Take 1 capsule (15 mg total) by mouth at bedtime as needed for sleep., Disp: 90 capsule, Rfl: 1 .  triamcinolone cream (KENALOG) 0.1 %, Apply 1 application topically 2 (two) times daily., Disp: 80 g, Rfl: 0 .  valACYclovir (VALTREX) 1000 MG tablet, Take 1 tablet (1,000 mg total) by mouth 2 (two) times daily for 7 days., Disp: 20 tablet, Rfl: 0  Review of Systems  Constitutional: Negative.   Respiratory: Negative.   Genitourinary: Negative.   Neurological: Negative.     Social History   Tobacco Use  . Smoking status: Former Smoker    Packs/day: 0.50    Quit date: 11/15/1976    Years since quitting: 42.8  . Smokeless tobacco: Never Used  Substance Use Topics  . Alcohol use: No      Objective:   BP (!) 145/79 (BP Location: Left Arm, Patient Position: Sitting, Cuff Size: Normal)   Pulse 78   Temp (!) 96.8 F (36 C) (Temporal)   Wt 146 lb 9.6 oz (66.5 kg)   BMI 26.81 kg/m  Vitals:   09/18/19 1328  BP: (!) 145/79  Pulse: 78  Temp: (!) 96.8 F (36  C)  TempSrc: Temporal  Weight: 146 lb 9.6 oz (66.5 kg)  Body mass index is 26.81 kg/m.   Physical Exam Constitutional:      Appearance: Normal appearance.  Genitourinary:   Skin:    General: Skin is warm and dry.  Neurological:     Mental Status: She is alert and oriented to person, place, and time. Mental status is at baseline.  Psychiatric:        Mood and Affect: Mood normal.        Behavior: Behavior normal.      No results found for any visits on 09/18/19.     Assessment & Plan    1. Vaginal lesion  Ulcerative appearing lesion at introitus. No discharge noted. Swabbed with both vaginitis swab and HSV culture. Will treat with valtrex as below. Should also consider syphilis though I find it unlikely due to lack of sexual partners in many decades. Return if not improving.   -  Cervicovaginal ancillary only - Herpes simplex virus culture - valACYclovir (VALTREX) 1000 MG tablet; Take 1 tablet (1,000 mg total) by mouth 2 (two) times daily for 7 days.  Dispense: 20 tablet; Refill: 0  The entirety of the information documented in the History of Present Illness, Review of Systems and Physical Exam were personally obtained by me. Portions of this information were initially documented by [facility], CMA and reviewed by me for thoroughness and accuracy.          Trinna Post, PA-C  [facility]

## 2019-09-20 LAB — CERVICOVAGINAL ANCILLARY ONLY
Bacterial Vaginitis (gardnerella): NEGATIVE
Candida Glabrata: NEGATIVE
Candida Vaginitis: NEGATIVE
Chlamydia: NEGATIVE
Comment: NEGATIVE
Comment: NEGATIVE
Comment: NEGATIVE
Comment: NEGATIVE
Comment: NEGATIVE
Comment: NORMAL
Neisseria Gonorrhea: NEGATIVE
Trichomonas: NEGATIVE

## 2019-09-21 LAB — HERPES SIMPLEX VIRUS CULTURE

## 2019-09-22 ENCOUNTER — Telehealth: Payer: Self-pay

## 2019-09-22 DIAGNOSIS — N898 Other specified noninflammatory disorders of vagina: Secondary | ICD-10-CM

## 2019-09-22 NOTE — Telephone Encounter (Signed)
Patient was advised. Patient states that she will wait to finish the medication she is currently taking before deciding if she want to see OBGYN or specialist. Juluis Rainier

## 2019-09-22 NOTE — Telephone Encounter (Signed)
-----   Message from Trinna Post, [place] sent at 09/22/2019  1:30 PM EST ----- Swab is negative for yeas and all other causes of vaginitis. HSV is negative. There was a lesion on exam however. Would she like to see an OBGYN or a specialist that could further evaluate?

## 2019-10-02 NOTE — Telephone Encounter (Signed)
Pt stated she would like to go ahead with referral to OBGYN and she would like to see a female. Please advise. Thanks TNP

## 2019-10-02 NOTE — Telephone Encounter (Signed)
Referral placed.

## 2019-10-05 ENCOUNTER — Telehealth: Payer: Self-pay | Admitting: Obstetrics & Gynecology

## 2019-10-05 NOTE — Telephone Encounter (Signed)
[facility] referring for Vaginal lesion. Attempt to reach patient to schedule appointment mobile number on file voicemail is not set up. Unable to leave message

## 2019-10-09 ENCOUNTER — Other Ambulatory Visit: Payer: Self-pay

## 2019-10-09 ENCOUNTER — Ambulatory Visit (INDEPENDENT_AMBULATORY_CARE_PROVIDER_SITE_OTHER): Payer: Medicare Other | Admitting: Family Medicine

## 2019-10-09 VITALS — BP 146/80 | HR 81 | Temp 96.9°F | Ht 60.5 in | Wt 138.0 lb

## 2019-10-09 DIAGNOSIS — L309 Dermatitis, unspecified: Secondary | ICD-10-CM | POA: Diagnosis not present

## 2019-10-09 NOTE — Progress Notes (Signed)
[name]  MRN: [id number] DOB: 12-06-1928  Subjective:  HPI   The patient is an 85 year old female who presents for evaluation of a rash.  The patient reports being out in the yard 5 days ago working on getting up leaves and mulch.  Later in the day she noticed her navel was very red and uncomfortable.   She states that night after bathing it was across her abdomen.  She reports today it is better.  No discoloration, but states that it is still "nagging".   It never itched, or blister, and she did not run any fever.  Patient Active Problem List   Diagnosis Date Noted   Irritable bowel syndrome with diarrhea 04/20/2017   Diabetes mellitus without complication ([place]) [id number]   Allergic rhinitis 08/05/2015   Aortic heart valve narrowing 08/05/2015   Breast cyst 08/05/2015   Carotid artery narrowing 08/05/2015   Colon polyp 08/05/2015   History of CVA (cerebrovascular accident) 08/05/2015   DD (diverticular disease) 08/05/2015   Elevated CK 08/05/2015   Acid reflux 08/05/2015   Headache disorder 08/05/2015   Calcium blood increased 08/05/2015   HLD (hyperlipidemia) 08/05/2015   BP (high blood pressure) 08/05/2015   Cannot sleep 08/05/2015   LBP (low back pain) 08/05/2015   OP (osteoporosis) 08/05/2015   Adiposity 08/05/2015   Abnormal Pap smear of vagina 08/05/2015   Hemorrhage, postmenopausal 08/05/2015   Superficial thrombophlebitis 08/05/2015   FOM (frequency of micturition) 08/05/2015   Fatigue 08/05/2015    Past Medical History:  Diagnosis Date   Cancer ([place])    skin   Diabetes mellitus without complication ([place])    type 2   Hyperlipidemia    Hypertension    IBS (irritable bowel syndrome)    Melanoma ([place])    Past Surgical History:  Procedure Laterality Date   BREAST BIOPSY Right    neg   BREAST SURGERY     1980's   CAROTID ARTERY ANGIOPLASTY Left 01/18/2015   Dr. Delana Meyer [facility]   CHOLECYSTECTOMY  06/27/2012   DECOMPRESSIVE LUMBAR LAMINECTOMY LEVEL 1   1950   EYE SURGERY Right 2012   cataract extraction   SKIN SURGERY     Family History  Problem Relation Age of Onset   Ovarian cancer Mother    Heart disease Brother    Heart attack Brother    Gout Brother    Alzheimer's disease Brother    Dementia Brother    Heart attack Brother    Diabetes Daughter    Melanoma Daughter    Stroke Son    Breast cancer Neg Hx    Social History   Socioeconomic History   Marital status: Widowed    Spouse name: Not on file   Number of children: 2   Years of education: Not on file   Highest education level: 12th grade  Occupational History   Occupation: retired  Tobacco Use   Smoking status: Former Smoker    Packs/day: 0.50    Quit date: 11/15/1976    Years since quitting: 44.2   Smokeless tobacco: Never Used  Vaping Use   Vaping Use: Never used  Substance and Sexual Activity   Alcohol use: No   Drug use: No   Sexual activity: Not Currently    Birth control/protection: Post-menopausal  Other Topics Concern   Not on file  Social History Narrative   Not on file   Social Determinants of Health   Financial Resource Strain: Low Risk  Difficulty of Paying Living Expenses: Not hard at all  Food Insecurity: No Food Insecurity   Worried About [place] in the Last Year: Never true   Ran Out of Food in the Last Year: Never true  Transportation Needs: No Transportation Needs   Lack of Transportation (Medical): No   Lack of Transportation (Non-Medical): No  Physical Activity: Inactive   Days of Exercise per Week: 0 days   Minutes of Exercise per Session: 0 min  Stress: No Stress Concern Present   Feeling of Stress : Not at all  Social Connections: Moderately Isolated   Frequency of Communication with Friends and Family: Twice a week   Frequency of Social Gatherings with Friends and Family: Twice a week   Attends Religious Services: More than 4 times per year   Active Member of [employer] or Organizations: No   Attends Theatre manager Meetings: Never   Marital Status: Widowed  Intimate Partner Violence: Not At Risk   Fear of Current or Ex-Partner: No   Emotionally Abused: No   Physically Abused: No   Sexually Abused: No    Outpatient Encounter Medications as of 10/09/2019  Medication Sig Note   aspirin EC 81 MG tablet Take 81 mg by mouth daily.     cholecalciferol (VITAMIN D) 1000 UNITS tablet Take 1,000 Units by mouth daily.  08/05/2015: Received from: [facility]   erythromycin ophthalmic ointment Place 1 application into both eyes once. Reported on 03/25/2016 05/09/2020: Uses per week   glucose blood (ONE TOUCH ULTRA TEST) test strip Check blood glucose once daily    OMEGA-3 FATTY ACIDS PO Take 1 capsule by mouth daily.  08/05/2015: Received from: [facility]   polyethylene glycol (MIRALAX / GLYCOLAX) packet Take 17 g by mouth daily.    triamcinolone cream (KENALOG) 0.1 % Apply 1 application topically 2 (two) times daily.    [DISCONTINUED] amLODipine (NORVASC) 10 MG tablet Take 1 tablet (10 mg total) by mouth daily.    [DISCONTINUED] atorvastatin (LIPITOR) 40 MG tablet Take 1 tablet (40 mg total) by mouth daily at 6 PM.    [DISCONTINUED] glipiZIDE (GLUCOTROL XL) 10 MG 24 hr tablet Take 1 tablet (10 mg total) by mouth every other day.    [DISCONTINUED] hydrochlorothiazide (HYDRODIURIL) 12.5 MG tablet Take 1 tablet (12.5 mg total) by mouth every other day.    [DISCONTINUED] oxybutynin (DITROPAN) 5 MG tablet Take 1 tablet (5 mg total) by mouth daily. (Patient not taking: Reported on 05/13/2020)    [DISCONTINUED] temazepam (RESTORIL) 15 MG capsule Take 1 capsule (15 mg total) by mouth at bedtime as needed for sleep.    No facility-administered encounter medications on file as of 10/09/2019.    Allergies  Allergen Reactions   Cephalosporins    Codeine    Macrolides And Ketolides    Penicillins    Tramadol Nausea Only   Doxycycline Rash   Sulfa Antibiotics Rash     Review of Systems  Constitutional: Negative for chills, diaphoresis, fever and malaise/fatigue.  HENT: Negative for congestion, ear pain, sinus pain and sore throat.   Respiratory: Negative for cough and shortness of breath.   Cardiovascular: Negative for chest pain.  Gastrointestinal: Negative for abdominal pain and diarrhea.  Skin: Positive for itching and rash.  Neurological: Negative for headaches.    Objective:  BP (!) 146/80 (BP Location: Right Arm, Patient Position: Sitting, Cuff Size: Normal)    Pulse 81    Temp (!) 96.9 F (  36.1 C) (Skin)    Ht 5' 0.5" (1.537 m)    Wt 138 lb (62.6 kg)    SpO2 98%    BMI 26.51 kg/m   Physical Exam HENT:     Head: Normocephalic.  Eyes:     Conjunctiva/sclera: Conjunctivae normal.  Pulmonary:     Effort: Pulmonary effort is normal.  Abdominal:     Palpations: Abdomen is soft.  Musculoskeletal:        General: Normal range of motion.     Cervical back: Neck supple.  Skin:    Findings: Rash present.  Neurological:     Mental Status: She is alert and oriented to person, place, and time.  Psychiatric:        Mood and Affect: Mood and affect normal.        Judgment: Judgment normal.    Assessment and Plan :  1. Dermatitis Onset 5 days ago. Clearing with faint remains across abdomen and near navel. Suspect contact dermatitis or insect bites from mulch exposure. May use Kenolog cream she has at home if pruritic. Recheck prn.

## 2019-11-02 ENCOUNTER — Ambulatory Visit (INDEPENDENT_AMBULATORY_CARE_PROVIDER_SITE_OTHER): Payer: Medicare Other | Admitting: Obstetrics and Gynecology

## 2019-11-02 ENCOUNTER — Other Ambulatory Visit: Payer: Self-pay

## 2019-11-02 ENCOUNTER — Encounter: Payer: Self-pay | Admitting: Obstetrics and Gynecology

## 2019-11-02 VITALS — BP 138/80 | Ht 62.0 in | Wt 138.0 lb

## 2019-11-02 DIAGNOSIS — L900 Lichen sclerosus et atrophicus: Secondary | ICD-10-CM | POA: Diagnosis not present

## 2019-11-02 MED ORDER — CLOBETASOL PROPIONATE 0.05 % EX OINT: TOPICAL_OINTMENT | CUTANEOUS | 5 refills | Status: DC

## 2019-11-02 NOTE — Patient Instructions (Signed)
Lichen  Lichen Sclerosus Lichen sclerosus is a skin problem. It can happen on any part of the body. It happens most often in the anal or genital areas. It can cause itching and discomfort. Treatment can help to control symptoms. It can also help prevent scarring that may lead to other problems. What are the causes? The cause of this condition is not known. It is not passed from one person to another (not contagious). What increases the risk? This condition is more likely to develop in women. It most often occurs after menopause. What are the signs or symptoms? Symptoms of this condition include:  Thin, wrinkled, white areas on the skin.  Thickened white areas on the skin.  Red and swollen patches (lesions) on the skin.  Tears or cracks in the skin.  Bruising.  Blood blisters.  Very bad itching.  Pain, itching, or burning when peeing (urinating).  Trouble pooping (constipation). How is this diagnosed? This condition may be diagnosed with a physical exam. A sample of your skin may also be removed to be looked at under a microscope (biopsy). How is this treated? This condition may be treated with:  Creams or ointments (topical steroids) that are put on the skin in the affected areas. This is the most common treatment.  Medicines that are taken by mouth.  Surgery. This is only needed if the condition is very bad and is causing problems such as scarring. Follow these instructions at home:  Take or use over-the-counter and prescription medicines only as told by your doctor.  Use creams or ointments as told by your doctor.  Do not scratch the affected areas of skin.  If you are a woman, keep the vagina as clean and dry as you can.  Clean the affected area of skin gently with water. Avoid using rough towels or toilet paper.  Keep all follow-up visits as told by your doctor. This is important. Contact a doctor if:  Your redness, swelling, or pain gets worse.  You have  fluid, blood, or pus coming from the area.  You have new red and swollen patches on your skin.  You have a fever.  You have pain during sex. Summary  Lichen sclerosus is a skin problem. It can cause itching and discomfort.  This condition is usually treated with creams or ointments that are put on the skin in the affected areas.  Use medicines only as told by your doctor.  Do not scratch the affected areas of skin.  Keep all follow-up visits as told by your doctor. This is important. This information is not intended to replace advice given to you by your health care provider. Make sure you discuss any questions you have with your health care provider. Document Released: 10/15/2008 Document Revised: 03/17/2018 Document Reviewed: 03/17/2018 Elsevier Patient Education  2020 [employer].

## 2019-11-02 NOTE — Progress Notes (Signed)
Patient ID: [name], female   DOB: 12/06/1928, 85 y.o.   MRN: [id number]  Reason for Consult: Referral (Vaginal lesion x 6 weeks, itching no irritation )   Referred by Mar Daring, P*  Subjective:     HPI:  [name] is a 85 y.o. female. She presents today with complaints of a vulvarlesion. It has been present for 6 weeks. She has been having vulvar itching. She has been treated for a yeast infection and HSV  which did not resolve her symptoms. Her lab testing for these condiitions were negative.   Past Medical History:  Diagnosis Date  . Cancer ([place])    skin  . Diabetes mellitus without complication ([place])    type 2  . Hyperlipidemia   . Hypertension   . IBS (irritable bowel syndrome)    Family History  Problem Relation Age of Onset  . Ovarian cancer Mother   . Heart disease Brother   . Heart attack Brother   . Gout Brother   . Alzheimer's disease Brother   . Dementia Brother   . Heart attack Brother   . Diabetes Daughter   . Breast cancer Neg Hx    Past Surgical History:  Procedure Laterality Date  . BREAST BIOPSY Right    neg  . BREAST SURGERY     1980's  . CAROTID ARTERY ANGIOPLASTY Left 01/18/2015   Dr. Delana Meyer [facility]  . CHOLECYSTECTOMY  06/27/2012  . DECOMPRESSIVE LUMBAR LAMINECTOMY LEVEL 1  1950  . EYE SURGERY Right 2012   cataract extraction    Short Social History:  Social History   Tobacco Use  . Smoking status: Former Smoker    Packs/day: 0.50    Quit date: 11/15/1976    Years since quitting: 43.0  . Smokeless tobacco: Never Used  Substance Use Topics  . Alcohol use: No    Allergies  Allergen Reactions  . Cephalosporins   . Codeine   . Macrolides And Ketolides   . Penicillins   . Tramadol Nausea Only  . Doxycycline Rash  . Sulfa Antibiotics Rash    Current Outpatient Medications  Medication Sig Dispense Refill  . amLODipine (NORVASC) 10 MG tablet Take 1 tablet (10 mg total) by mouth daily. 90 tablet 3  . aspirin  EC 81 MG tablet Take 81 mg by mouth daily.     Marland Kitchen atorvastatin (LIPITOR) 40 MG tablet Take 1 tablet (40 mg total) by mouth daily at 6 PM. 90 tablet 3  . cholecalciferol (VITAMIN D) 1000 UNITS tablet Take 1,000 Units by mouth daily.     Marland Kitchen erythromycin ophthalmic ointment Place 1 application into both eyes once. Reported on 03/25/2016    . glipiZIDE (GLUCOTROL XL) 10 MG 24 hr tablet Take 1 tablet (10 mg total) by mouth every other day. 45 tablet 3  . glucose blood (ONE TOUCH ULTRA TEST) test strip Check blood glucose once daily 100 each 12  . hydrochlorothiazide (HYDRODIURIL) 12.5 MG tablet Take 1 tablet (12.5 mg total) by mouth every other day. 90 tablet 3  . OMEGA-3 FATTY ACIDS PO Take 1 capsule by mouth daily.     Marland Kitchen oxybutynin (DITROPAN) 5 MG tablet Take 1 tablet (5 mg total) by mouth daily. 90 tablet 3  . polyethylene glycol (MIRALAX / GLYCOLAX) packet Take 17 g by mouth daily.    Marland Kitchen triamcinolone cream (KENALOG) 0.1 % Apply 1 application topically 2 (two) times daily. 80 g 0  . clobetasol ointment (TEMOVATE) 0.05 %  Apply to affected area every night for 4 weeks, then every other day for 4 weeks and then twice a week for 4 weeks or until resolution. 30 g 5  . temazepam (RESTORIL) 15 MG capsule Take 1 capsule (15 mg total) by mouth at bedtime as needed for sleep. 90 capsule 1   No current facility-administered medications for this visit.    Review of Systems  Constitutional: Negative for chills, fatigue, fever and unexpected weight change.  HENT: Negative for trouble swallowing.  Eyes: Negative for loss of vision.  Respiratory: Negative for cough, shortness of breath and wheezing.  Cardiovascular: Negative for chest pain, leg swelling, palpitations and syncope.  GI: Negative for abdominal pain, blood in stool, diarrhea, nausea and vomiting.  GU: Negative for difficulty urinating, dysuria, frequency and hematuria.  Musculoskeletal: Negative for back pain, leg pain and joint pain.  Skin:  Negative for rash.  Neurological: Negative for dizziness, headaches, light-headedness, numbness and seizures.  Psychiatric: Negative for behavioral problem, confusion, depressed mood and sleep disturbance.        Objective:  Objective   Vitals:   11/02/19 1422  BP: 138/80  Weight: 138 lb (62.6 kg)  Height: 5\' 2"  (1.575 m)   Body mass index is 25.24 kg/m.  Physical Exam Vitals and nursing note reviewed. Exam conducted with a chaperone present.  Constitutional:      Appearance: She is well-developed.  HENT:     Head: Normocephalic and atraumatic.  Eyes:     Pupils: Pupils are equal, round, and reactive to light.  Cardiovascular:     Rate and Rhythm: Normal rate and regular rhythm.  Pulmonary:     Effort: Pulmonary effort is normal. No respiratory distress.  Genitourinary:    Labia:        Right: Lesion present.      Comments: Thin skin, hypopigmentation, small ecchymosis present.  Skin:    General: Skin is warm and dry.  Neurological:     Mental Status: She is alert and oriented to person, place, and time.  Psychiatric:        Behavior: Behavior normal.        Thought Content: Thought content normal.        Judgment: Judgment normal.         Assessment/Plan:     85 yo with vulvar itching and pain.  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 Discussed soak and seal method. Advised to apply clobetasol at night after bathing and before bed.  Will plan close follow up.   More than 20 minutes were spent face to face with the patient in the room with more than 50% of the time spent providing counseling and discussing the plan of management.   Adrian Prows MD [facility], [facility] 11/02/2019 3:10 PM

## 2019-11-06 ENCOUNTER — Other Ambulatory Visit: Payer: Self-pay

## 2019-11-06 DIAGNOSIS — F5101 Primary insomnia: Secondary | ICD-10-CM

## 2019-11-06 MED ORDER — TEMAZEPAM 15 MG PO CAPS: 15.0000 mg | ORAL_CAPSULE | Freq: Every evening | ORAL | 1 refills | Status: DC | PRN

## 2019-11-06 NOTE — Telephone Encounter (Signed)
Patient stopped by the clinic to have [place] refill her medication.

## 2019-11-13 DIAGNOSIS — D0339 Melanoma in situ of other parts of face: Secondary | ICD-10-CM | POA: Diagnosis not present

## 2019-11-13 DIAGNOSIS — L820 Inflamed seborrheic keratosis: Secondary | ICD-10-CM | POA: Diagnosis not present

## 2019-11-27 DIAGNOSIS — E782 Mixed hyperlipidemia: Secondary | ICD-10-CM | POA: Diagnosis not present

## 2019-11-27 DIAGNOSIS — Z8673 Personal history of transient ischemic attack (TIA), and cerebral infarction without residual deficits: Secondary | ICD-10-CM | POA: Diagnosis not present

## 2019-11-27 DIAGNOSIS — I10 Essential (primary) hypertension: Secondary | ICD-10-CM | POA: Diagnosis not present

## 2019-11-27 DIAGNOSIS — I6529 Occlusion and stenosis of unspecified carotid artery: Secondary | ICD-10-CM | POA: Diagnosis not present

## 2019-11-27 DIAGNOSIS — E119 Type 2 diabetes mellitus without complications: Secondary | ICD-10-CM | POA: Diagnosis not present

## 2019-11-27 DIAGNOSIS — I350 Nonrheumatic aortic (valve) stenosis: Secondary | ICD-10-CM | POA: Diagnosis not present

## 2019-12-11 DIAGNOSIS — E119 Type 2 diabetes mellitus without complications: Secondary | ICD-10-CM | POA: Diagnosis not present

## 2019-12-11 DIAGNOSIS — E782 Mixed hyperlipidemia: Secondary | ICD-10-CM | POA: Diagnosis not present

## 2019-12-11 DIAGNOSIS — I350 Nonrheumatic aortic (valve) stenosis: Secondary | ICD-10-CM | POA: Diagnosis not present

## 2019-12-11 DIAGNOSIS — I10 Essential (primary) hypertension: Secondary | ICD-10-CM | POA: Diagnosis not present

## 2019-12-18 ENCOUNTER — Ambulatory Visit: Payer: Medicare Other | Admitting: Obstetrics and Gynecology

## 2019-12-18 DIAGNOSIS — H6123 Impacted cerumen, bilateral: Secondary | ICD-10-CM | POA: Diagnosis not present

## 2019-12-18 DIAGNOSIS — H902 Conductive hearing loss, unspecified: Secondary | ICD-10-CM | POA: Diagnosis not present

## 2019-12-27 ENCOUNTER — Ambulatory Visit: Payer: Medicare Other | Admitting: Obstetrics and Gynecology

## 2020-01-15 ENCOUNTER — Other Ambulatory Visit: Payer: Self-pay

## 2020-01-15 ENCOUNTER — Encounter: Payer: Self-pay | Admitting: Obstetrics and Gynecology

## 2020-01-15 ENCOUNTER — Ambulatory Visit (INDEPENDENT_AMBULATORY_CARE_PROVIDER_SITE_OTHER): Payer: Medicare Other | Admitting: Obstetrics and Gynecology

## 2020-01-15 VITALS — BP 124/70 | Temp 95.4°F | Ht 62.0 in | Wt 138.0 lb

## 2020-01-15 DIAGNOSIS — L900 Lichen sclerosus et atrophicus: Secondary | ICD-10-CM | POA: Diagnosis not present

## 2020-01-15 DIAGNOSIS — N9089 Other specified noninflammatory disorders of vulva and perineum: Secondary | ICD-10-CM

## 2020-01-15 NOTE — Progress Notes (Signed)
Patient ID: [name], female   DOB: February 26, 1929, 86 y.o.   MRN: [id number]  Reason for Consult: Follow-up (Feeling much better with cream on )   Referred by Mar Daring, P*  Subjective:     HPI:  [name] is a 86 y.o. female she presents today for follow-up of lichen sclerosis.  She reports that she has had a recurrence of her melanoma and is currently going for repeat excision.  Past Medical History:  Diagnosis Date  . Cancer ([place])    skin  . Diabetes mellitus without complication ([place])    type 2  . Hyperlipidemia   . Hypertension   . IBS (irritable bowel syndrome)    Family History  Problem Relation Age of Onset  . Ovarian cancer Mother   . Heart disease Brother   . Heart attack Brother   . Gout Brother   . Alzheimer's disease Brother   . Dementia Brother   . Heart attack Brother   . Diabetes Daughter   . Breast cancer Neg Hx    Past Surgical History:  Procedure Laterality Date  . BREAST BIOPSY Right    neg  . BREAST SURGERY     1980's  . CAROTID ARTERY ANGIOPLASTY Left 01/18/2015   Dr. Delana Meyer [facility]  . CHOLECYSTECTOMY  06/27/2012  . DECOMPRESSIVE LUMBAR LAMINECTOMY LEVEL 1  1950  . EYE SURGERY Right 2012   cataract extraction    Short Social History:  Social History   Tobacco Use  . Smoking status: Former Smoker    Packs/day: 0.50    Quit date: 11/15/1976    Years since quitting: 43.1  . Smokeless tobacco: Never Used  Substance Use Topics  . Alcohol use: No    Allergies  Allergen Reactions  . Cephalosporins   . Codeine   . Macrolides And Ketolides   . Penicillins   . Tramadol Nausea Only  . Doxycycline Rash  . Sulfa Antibiotics Rash    Current Outpatient Medications  Medication Sig Dispense Refill  . amLODipine (NORVASC) 10 MG tablet Take 1 tablet (10 mg total) by mouth daily. 90 tablet 3  . aspirin EC 81 MG tablet Take 81 mg by mouth daily.     Marland Kitchen atorvastatin (LIPITOR) 40 MG tablet Take 1 tablet (40 mg total) by mouth  daily at 6 PM. 90 tablet 3  . cholecalciferol (VITAMIN D) 1000 UNITS tablet Take 1,000 Units by mouth daily.     . clobetasol ointment (TEMOVATE) 0.05 % Apply to affected area every night for 4 weeks, then every other day for 4 weeks and then twice a week for 4 weeks or until resolution. 30 g 5  . erythromycin ophthalmic ointment Place 1 application into both eyes once. Reported on 03/25/2016    . glipiZIDE (GLUCOTROL XL) 10 MG 24 hr tablet Take 1 tablet (10 mg total) by mouth every other day. 45 tablet 3  . glucose blood (ONE TOUCH ULTRA TEST) test strip Check blood glucose once daily 100 each 12  . hydrochlorothiazide (HYDRODIURIL) 12.5 MG tablet Take 1 tablet (12.5 mg total) by mouth every other day. 90 tablet 3  . OMEGA-3 FATTY ACIDS PO Take 1 capsule by mouth daily.     Marland Kitchen oxybutynin (DITROPAN) 5 MG tablet Take 1 tablet (5 mg total) by mouth daily. 90 tablet 3  . polyethylene glycol (MIRALAX / GLYCOLAX) packet Take 17 g by mouth daily.    . temazepam (RESTORIL) 15 MG capsule Take 1  capsule (15 mg total) by mouth at bedtime as needed for sleep. 90 capsule 1  . triamcinolone cream (KENALOG) 0.1 % Apply 1 application topically 2 (two) times daily. 80 g 0   No current facility-administered medications for this visit.    Review of Systems  Constitutional: Negative for chills, fatigue, fever and unexpected weight change.  HENT: Negative for trouble swallowing.  Eyes: Negative for loss of vision.  Respiratory: Negative for cough, shortness of breath and wheezing.  Cardiovascular: Negative for chest pain, leg swelling, palpitations and syncope.  GI: Negative for abdominal pain, blood in stool, diarrhea, nausea and vomiting.  GU: Negative for difficulty urinating, dysuria, frequency and hematuria.  Musculoskeletal: Negative for back pain, leg pain and joint pain.  Skin: Negative for rash.  Neurological: Negative for dizziness, headaches, light-headedness, numbness and seizures.  Psychiatric:  Negative for behavioral problem, confusion, depressed mood and sleep disturbance.        Objective:  Objective   Vitals:   01/15/20 0949  BP: 124/70  Temp: (!) 95.4 F (35.2 C)  Weight: 138 lb (62.6 kg)  Height: 5\' 2"  (1.575 m)   Body mass index is 25.24 kg/m.  Physical Exam Vitals and nursing note reviewed.  Constitutional:      Appearance: She is well-developed.  HENT:     Head: Normocephalic and atraumatic.  Eyes:     Pupils: Pupils are equal, round, and reactive to light.  Cardiovascular:     Rate and Rhythm: Normal rate and regular rhythm.  Pulmonary:     Effort: Pulmonary effort is normal. No respiratory distress.  Genitourinary:   Skin:    General: Skin is warm and dry.  Neurological:     Mental Status: She is alert and oriented to person, place, and time.  Psychiatric:        Behavior: Behavior normal.        Thought Content: Thought content normal.        Judgment: Judgment normal.         Assessment/Plan:     86 year old with vulvar lesion and lichen sclerosis Thinning of skin and ecchymosis have greatly improved with clobetasol ointment.  Patient is currently using every other day.  Recommended continued usage and gradual taper to a once weekly regiment.  Recommended biopsy of unchanged right labial lesion and new firm left perineal nodule.  Patient declines at this time.  She is currently tending to a scab on her head and has another melanoma biopsy planned.  Discussed that she could have vulvar melanoma as well and encouraged patient to discuss this with her other providers.  She is uncomfortable doing so since they are female.  She reports that she will follow-up for biopsy when she is ready.   More than 15 minutes were spent face to face with the patient in the room with more than 50% of the time spent providing counseling and discussing the plan of management.    Adrian Prows MD [facility], [facility] 01/15/2020 10:38  AM

## 2020-02-06 DIAGNOSIS — D0339 Melanoma in situ of other parts of face: Secondary | ICD-10-CM | POA: Diagnosis not present

## 2020-02-06 DIAGNOSIS — L578 Other skin changes due to chronic exposure to nonionizing radiation: Secondary | ICD-10-CM | POA: Diagnosis not present

## 2020-02-06 DIAGNOSIS — Z85828 Personal history of other malignant neoplasm of skin: Secondary | ICD-10-CM | POA: Diagnosis not present

## 2020-02-06 HISTORY — PX: MELANOMA EXCISION: SHX5266

## 2020-02-07 ENCOUNTER — Other Ambulatory Visit: Payer: Self-pay | Admitting: Physician Assistant

## 2020-02-07 DIAGNOSIS — F5101 Primary insomnia: Secondary | ICD-10-CM

## 2020-02-07 MED ORDER — TEMAZEPAM 15 MG PO CAPS: 15.0000 mg | ORAL_CAPSULE | Freq: Every evening | ORAL | 1 refills | Status: DC | PRN

## 2020-02-07 NOTE — Telephone Encounter (Signed)
Medication Refill - Medication: temazepam (RESTORIL) 15 MG capsule  Has the patient contacted their pharmacy? No - only has two pills left.   (Agent: If no, request that the patient contact the pharmacy for the refill.) (Agent: If yes, when and what did the pharmacy advise?)  Preferred Pharmacy (with phone number or street name):  [place], [place] Phone:  [phone]  Fax:  [phone]     Agent: Please be advised that RX refills may take up to 3 business days. We ask that you follow-up with your pharmacy.

## 2020-02-07 NOTE — Telephone Encounter (Signed)
Requested medication (s) are due for refill today: Yes  Requested medication (s) are on the active medication list: Yes  Last refill:  11/06/19  Future visit scheduled: Yes  Notes to clinic:  See request.    Requested Prescriptions  Pending Prescriptions Disp Refills   temazepam (RESTORIL) 15 MG capsule 90 capsule 1    Sig: Take 1 capsule (15 mg total) by mouth at bedtime as needed for sleep.      Not Delegated - Psychiatry:  Anxiolytics/Hypnotics Failed - 02/07/2020 10:36 AM      Failed - This refill cannot be delegated      Failed - Urine Drug Screen completed in last 360 days.      Passed - Valid encounter within last 6 months    Recent Outpatient Visits           4 months ago    [place], [place]   4 months ago Vaginal lesion   [place], [place], PA-C   5 months ago Yeast infection of the vagina   [place], [place], [place]   9 months ago Diabetes mellitus without complication [facility])   [place], [place], PA-C   1 year ago Diabetes mellitus without complication [facility])   [place], Clearnce Sorrel, PA-C       Future Appointments             In 3 months  [facility], [place]   In 3 months [place], Clearnce Sorrel, PA-C [facility], [place]

## 2020-02-07 NOTE — Telephone Encounter (Signed)
LOV  10/09/19 Acute  LRF   11/06/19  # 90 x 1

## 2020-04-22 ENCOUNTER — Other Ambulatory Visit: Payer: Self-pay | Admitting: Physician Assistant

## 2020-04-22 DIAGNOSIS — I10 Essential (primary) hypertension: Secondary | ICD-10-CM

## 2020-04-22 NOTE — Telephone Encounter (Signed)
Requested Prescriptions  Pending Prescriptions Disp Refills  . amLODipine (NORVASC) 10 MG tablet [Pharmacy Med Name: amLODIPine Besylate 10 MG Oral Tablet] 30 tablet 0    Sig: Take 1 tablet by mouth once daily     Cardiovascular:  Calcium Channel Blockers Failed - 04/22/2020 10:13 AM      Failed - Valid encounter within last 6 months    Recent Outpatient Visits          6 months ago    [place], [place]   7 months ago Vaginal lesion   [place], [place], PA-C   7 months ago Yeast infection of the vagina   [facility] [name] M, [place]   11 months ago Diabetes mellitus without complication [facility])   [place], [place], [place]   1 year ago Diabetes mellitus without complication [facility])   [place], Clearnce Sorrel, PA-C      Future Appointments            In 3 weeks  [facility], [place]   In 3 weeks [place], Clearnce Sorrel, PA-C [facility], [facility]           Passed - Last BP in normal range    BP Readings from Last 1 Encounters:  01/15/20 124/70

## 2020-05-06 DIAGNOSIS — D044 Carcinoma in situ of skin of scalp and neck: Secondary | ICD-10-CM | POA: Diagnosis not present

## 2020-05-08 NOTE — Progress Notes (Signed)
Subjective:   [name] is a 86 y.o. female who presents for Medicare Annual (Subsequent) preventive examination.  I connected with [name] today by telephone and verified that I am speaking with the correct person using two identifiers. Location patient: home Location provider: work Persons participating in the virtual visit: patient, provider.   I discussed the limitations, risks, security and privacy concerns of performing an evaluation and management service by telephone and the availability of in person appointments. I also discussed with the patient that there may be a patient responsible charge related to this service. The patient expressed understanding and verbally consented to this telephonic visit.    Interactive audio and video telecommunications were attempted between this provider and patient, however failed, due to patient having technical difficulties OR patient did not have access to video capability.  We continued and completed visit with audio only.  Review of Systems    N/A  Cardiac Risk Factors include: advanced age (>51men, >72 women);hypertension;dyslipidemia;diabetes mellitus     Objective:    There were no vitals filed for this visit. There is no height or weight on file to calculate BMI.  Advanced Directives 05/09/2020 05/08/2019 04/28/2018 04/20/2017 11/02/2016 04/15/2016 08/05/2015  Does Patient Have a Medical Advance Directive? No Yes No Yes Yes Yes Yes  Type of Advance Directive - [place];Living will - Living will;Healthcare Power of [place];Living will [place];Living will Living will  Copy of [place] in Chart? - No - copy requested - No - copy requested - - -  Would patient like information on creating a medical advance directive? No - Patient declined - No - Patient declined - - - -    Current Medications (verified) Outpatient Encounter Medications as of  05/09/2020  Medication Sig  . amLODipine (NORVASC) 10 MG tablet Take 1 tablet by mouth once daily  . aspirin EC 81 MG tablet Take 81 mg by mouth daily.   Marland Kitchen atorvastatin (LIPITOR) 40 MG tablet Take 1 tablet (40 mg total) by mouth daily at 6 PM.  . cholecalciferol (VITAMIN D) 1000 UNITS tablet Take 1,000 Units by mouth daily.   . clobetasol ointment (TEMOVATE) 0.05 % Apply to affected area every night for 4 weeks, then every other day for 4 weeks and then twice a week for 4 weeks or until resolution.  Marland Kitchen erythromycin ophthalmic ointment Place 1 application into both eyes once. Reported on 03/25/2016  . glipiZIDE (GLUCOTROL XL) 10 MG 24 hr tablet Take 1 tablet (10 mg total) by mouth every other day.  Marland Kitchen glucose blood (ONE TOUCH ULTRA TEST) test strip Check blood glucose once daily  . hydrochlorothiazide (HYDRODIURIL) 12.5 MG tablet Take 1 tablet (12.5 mg total) by mouth every other day.  . OMEGA-3 FATTY ACIDS PO Take 1 capsule by mouth daily.   . polyethylene glycol (MIRALAX / GLYCOLAX) packet Take 17 g by mouth daily.  . temazepam (RESTORIL) 15 MG capsule Take 1 capsule (15 mg total) by mouth at bedtime as needed for sleep.  Marland Kitchen triamcinolone cream (KENALOG) 0.1 % Apply 1 application topically 2 (two) times daily.  Marland Kitchen oxybutynin (DITROPAN) 5 MG tablet Take 1 tablet (5 mg total) by mouth daily. (Patient not taking: Reported on 05/09/2020)   No facility-administered encounter medications on file as of 05/09/2020.    Allergies (verified) Cephalosporins, Codeine, Macrolides and ketolides, Penicillins, Tramadol, Doxycycline, and Sulfa antibiotics   History: Past Medical History:  Diagnosis  Date  . Cancer ([place])    skin  . Diabetes mellitus without complication ([place])    type 2  . Hyperlipidemia   . Hypertension   . IBS (irritable bowel syndrome)   . Melanoma [facility])    Past Surgical History:  Procedure Laterality Date  . BREAST BIOPSY Right    neg  . BREAST SURGERY     1980's  . CAROTID ARTERY  ANGIOPLASTY Left 01/18/2015   Dr. Delana Meyer [facility]  . CHOLECYSTECTOMY  06/27/2012  . DECOMPRESSIVE LUMBAR LAMINECTOMY LEVEL 1  1950  . EYE SURGERY Right 2012   cataract extraction  . SKIN SURGERY     Family History  Problem Relation Age of Onset  . Ovarian cancer Mother   . Heart disease Brother   . Heart attack Brother   . Gout Brother   . Alzheimer's disease Brother   . Dementia Brother   . Heart attack Brother   . Diabetes Daughter   . Melanoma Daughter   . Stroke Son   . Breast cancer Neg Hx    Social History   Socioeconomic History  . Marital status: Widowed    Spouse name: Not on file  . Number of children: 2  . Years of education: Not on file  . Highest education level: 12th grade  Occupational History  . Occupation: retired  Tobacco Use  . Smoking status: Former Smoker    Packs/day: 0.50    Quit date: 11/15/1976    Years since quitting: 43.5  . Smokeless tobacco: Never Used  Vaping Use  . Vaping Use: Never used  Substance and Sexual Activity  . Alcohol use: No  . Drug use: No  . Sexual activity: Not Currently    Birth control/protection: Post-menopausal  Other Topics Concern  . Not on file  Social History Narrative  . Not on file   Social Determinants of Health   Financial Resource Strain: Low Risk   . Difficulty of Paying Living Expenses: Not hard at all  Food Insecurity: No Food Insecurity  . Worried About Charity fundraiser in the Last Year: Never true  . Ran Out of Food in the Last Year: Never true  Transportation Needs: No Transportation Needs  . Lack of Transportation (Medical): No  . Lack of Transportation (Non-Medical): No  Physical Activity: Inactive  . Days of Exercise per Week: 0 days  . Minutes of Exercise per Session: 0 min  Stress: No Stress Concern Present  . Feeling of Stress : Not at all  Social Connections: Moderately Isolated  . Frequency of Communication with Friends and Family: Twice a week  . Frequency of Social Gatherings  with Friends and Family: Twice a week  . Attends Religious Services: More than 4 times per year  . Active Member of Clubs or Organizations: No  . Attends Archivist Meetings: Never  . Marital Status: Widowed    Tobacco Counseling Counseling given: Not Answered   Clinical Intake:  Pre-visit preparation completed: Yes  Pain : No/denies pain     Nutritional Risks: Nausea/ vomitting/ diarrhea (occasional diarrhea due to diet) Diabetes: Yes CBG done?: No Did pt. bring in CBG monitor from home?: No  How often do you need to have someone help you when you read instructions, pamphlets, or other written materials from your doctor or pharmacy?: 1 - Never  Diabetic? Yes  Nutrition Risk Assessment:  Has the patient had any N/V/D within the last 2 months?  No  Does the  patient have any non-healing wounds?  No  Has the patient had any unintentional weight loss or weight gain?  No   Diabetes:  Is the patient diabetic?  Yes  If diabetic, was a CBG obtained today?  No  Did the patient bring in their glucometer from home?  No  How often do you monitor your CBG's? Checks BS once a week.   Financial Strains and Diabetes Management:  Are you having any financial strains with the device, your supplies or your medication? No .  Does the patient want to be seen by Chronic Care Management for management of their diabetes?  No  Would the patient like to be referred to a Nutritionist or for Diabetic Management?  No   Diabetic Exams:  Diabetic Eye Exam: Overdue for diabetic eye exam. Pt has been advised about the importance in completing this exam. Patient advised to call and schedule an eye exam. goes to [facility] Diabetic Foot Exam: Overdue, Pt has been advised about the importance in completing this exam. Note made to follow up on this at next appointment.   Interpreter Needed?: No  Information entered by :: screws,LPN   Activities of Daily Living In your present state of  health, do you have any difficulty performing the following activities: 05/09/2020  Hearing? N  Vision? N  Difficulty concentrating or making decisions? N  Walking or climbing stairs? N  Dressing or bathing? N  Doing errands, shopping? N  Preparing Food and eating ? N  Using the Toilet? N  In the past six months, have you accidently leaked urine? Y  Comment Occassionally has bladder leakage.  Do you have problems with loss of bowel control? N  Managing your Medications? N  Managing your Finances? N  Housekeeping or managing your Housekeeping? N  Some recent data might be hidden    Patient Care Team: Rubye Beach as PCP - General (Family Medicine) Eulogio Bear, MD as Consulting Physician (Ophthalmology) Ubaldo Glassing Javier Docker, MD as Consulting Physician (Cardiology) Troxler, Adele Schilder as Attending Physician (Podiatry) Dasher, Rayvon Char, MD (Dermatology)  Indicate any recent Medical Services you may have received from other than [facility] providers in the past year (date may be approximate).     Assessment:   This is a routine wellness examination for Briteny.  Hearing/Vision screen No exam data present  Dietary issues and exercise activities discussed: Current Exercise Habits: The patient does not participate in regular exercise at present, Exercise limited by: Other - see comments (Hx of stroke, MD limited her walking in heat so no longer walks)  Goals    . DIET - EAT MORE FRUITS AND VEGETABLES     Recommend adding in 2 servings each of fruits and vegetables in daily diet.     . Increase water intake     Recommend increasing water intake to 6 glasses a day.      Depression Screen PHQ 2/9 Scores 05/09/2020 05/08/2019 04/28/2018 04/20/2017 04/20/2017 04/15/2016  PHQ - 2 Score 0 0 0 0 0 0  PHQ- 9 Score - - - 3 - -    Fall Risk Fall Risk  05/09/2020 09/08/2019 05/08/2019 01/09/2019 04/28/2018  Falls in the past year? 0 0 0 1 No  Comment - - - first of January -  Number  falls in past yr: 0 0 - 0 -  Injury with Fall? 0 0 - 0 -  Follow up - Falls evaluation completed - - -    Any  stairs in or around the home? No  If so, are there any without handrails? No  Home free of loose throw rugs in walkways, pet beds, electrical cords, etc? Yes  Adequate lighting in your home to reduce risk of falls? Yes   ASSISTIVE DEVICES UTILIZED TO PREVENT FALLS:  Life alert? Yes  Use of a cane, walker or w/c? No  Grab bars in the bathroom? Yes  Shower chair or bench in shower? Yes  Elevated toilet seat or a handicapped toilet? No    Cognitive Function:     6CIT Screen 05/09/2020 04/20/2017  What Year? 0 points 0 points  What month? 0 points 0 points  What time? 0 points 0 points  Count back from 20 0 points 0 points  Months in reverse 0 points 0 points  Repeat phrase 0 points 2 points  Total Score 0 2    Immunizations Immunization History  Administered Date(s) Administered  . Influenza, High Dose Seasonal PF 09/05/2015  . Influenza,inj,Quad PF,6+ Mos 08/14/2013  . Influenza-Unspecified 08/31/2017, 08/08/2018  . Pneumococcal Conjugate-13 04/02/2014  . Pneumococcal Polysaccharide-23 12/08/2004  . Zoster 03/03/2010    TDAP status: Due, Education has been provided regarding the importance of this vaccine. Advised may receive this vaccine at local pharmacy or Health Dept. Aware to provide a copy of the vaccination record if obtained from local pharmacy or Health Dept. Verbalized acceptance and understanding. Flu Vaccine status: Declined, Education has been provided regarding the importance of this vaccine but patient still declined. Advised may receive this vaccine at local pharmacy or Health Dept. Aware to provide a copy of the vaccination record if obtained from local pharmacy or Health Dept. Verbalized acceptance and understanding. Pneumococcal vaccine status: Up to date Covid-19 vaccine status: Completed vaccines  Qualifies for Shingles Vaccine? Yes   Zostavax  completed Yes   Shingrix Completed?: No.    Education has been provided regarding the importance of this vaccine. Patient has been advised to call insurance company to determine out of pocket expense if they have not yet received this vaccine. Advised may also receive vaccine at local pharmacy or Health Dept. Verbalized acceptance and understanding.  Screening Tests Health Maintenance  Topic Date Due  . COVID-19 Vaccine (1) Never done  . URINE MICROALBUMIN  04/20/2018  . HEMOGLOBIN A1C  11/07/2019  . FOOT EXAM  01/10/2020  . OPHTHALMOLOGY EXAM  04/06/2020  . TETANUS/TDAP  11/16/2026 (Originally 01/20/1948)  . DEXA SCAN  04/17/2031 (Originally 03/15/2014)  . INFLUENZA VACCINE  06/16/2020  . PNA vac Low Risk Adult  Completed    Health Maintenance  Health Maintenance Due  Topic Date Due  . COVID-19 Vaccine (1) Never done  . URINE MICROALBUMIN  04/20/2018  . HEMOGLOBIN A1C  11/07/2019  . FOOT EXAM  01/10/2020  . OPHTHALMOLOGY EXAM  04/06/2020    Colorectal cancer screening: No longer required.  Mammogram status: No longer required.  Bone Density status: Completed 03/15/12. Results reflect: Bone density results: OSTEOPOROSIS. Repeat every 2 years. Patient declined  Lung Cancer Screening: (Low Dose CT Chest recommended if Age 53-80 years, 30 pack-year currently smoking OR have quit w/in 15years.) does not qualify.   Additional Screening:  Vision Screening: Recommended annual ophthalmology exams for early detection of glaucoma and other disorders of the eye. Is the patient up to date with their annual eye exam?  Yes  Who is the provider or what is the name of the office in which the patient attends annual eye exams? [facility] If  pt is not established with a provider, would they like to be referred to a provider to establish care? No .   Dental Screening: Recommended annual dental exams for proper oral hygiene  Community Resource Referral / Chronic Care Management: CRR required this visit?   No   CCM required this visit?  No      Plan:     I have personally reviewed and noted the following in the patient's chart:   . Medical and social history . Use of alcohol, tobacco or illicit drugs  . Current medications and supplements . Functional ability and status . Nutritional status . Physical activity . Advanced directives . List of other physicians . Hospitalizations, surgeries, and ER visits in previous 12 months . Vitals . Screenings to include cognitive, depression, and falls . Referrals and appointments  In addition, I have reviewed and discussed with patient certain preventive protocols, quality metrics, and best practice recommendations. A written personalized care plan for preventive services as well as general preventive health recommendations were provided to patient.     Karsten Howry [place], [place]   [id number]   Nurse Notes: Pt due for Hemoglobin A1c, Urine Microalbumin, and foot exam at next appointment. Pt to have eye exam notes faxed over from exam of this year.

## 2020-05-09 ENCOUNTER — Other Ambulatory Visit: Payer: Self-pay

## 2020-05-09 ENCOUNTER — Ambulatory Visit (INDEPENDENT_AMBULATORY_CARE_PROVIDER_SITE_OTHER): Payer: Medicare Other

## 2020-05-09 DIAGNOSIS — Z0000 Encounter for general adult medical examination without abnormal findings: Secondary | ICD-10-CM

## 2020-05-09 NOTE — Patient Instructions (Signed)
[name] , Thank you for taking time to come for your Medicare Wellness Visit. I appreciate your ongoing commitment to your health goals. Please review the following plan we discussed and let me know if I can assist you in the future.   Screening recommendations/referrals: Colonoscopy: No longer required Mammogram: No longer required Bone Density: currently due, Recommended yearly ophthalmology/optometry visit for glaucoma screening and checkup Recommended yearly dental visit for hygiene and checkup  Vaccinations: Influenza vaccine: Done, due 07/2020 Pneumococcal vaccine: Completed series Tdap vaccine: Due, patient declined Shingles vaccine: currently due, declined today    Advanced directives: Advance directive discussed with you today. Even though you declined this today please call our office should you change your mind and we can give you the proper paperwork for you to fill out.   Conditions/risks identified: Increase water intake to 6 cups of plain water daily, increase servings of vegetables, fruits to at least 2 servings per day.  Next appointment: 05/13/2020 at 10:00 am with [name], PA-C;   Preventive Care 65 Years and Older, Female Preventive care refers to lifestyle choices and visits with your health care provider that can promote health and wellness. What does preventive care include?  A yearly physical exam. This is also called an annual well check.  Dental exams once or twice a year.  Routine eye exams. Ask your health care provider how often you should have your eyes checked.  Personal lifestyle choices, including:  Daily care of your teeth and gums.  Regular physical activity.  Eating a healthy diet.  Avoiding tobacco and drug use.  Limiting alcohol use.  Practicing safe sex.  Taking low-dose aspirin every day.  Taking vitamin and mineral supplements as recommended by your health care provider. What happens during an annual well  check? The services and screenings done by your health care provider during your annual well check will depend on your age, overall health, lifestyle risk factors, and family history of disease. Counseling  Your health care provider may ask you questions about your:  Alcohol use.  Tobacco use.  Drug use.  Emotional well-being.  Home and relationship well-being.  Sexual activity.  Eating habits.  History of falls.  Memory and ability to understand (cognition).  Work and work Statistician.  Reproductive health. Screening  You may have the following tests or measurements:  Height, weight, and BMI.  Blood pressure.  Lipid and cholesterol levels. These may be checked every 5 years, or more frequently if you are over 40 years old.  Skin check.  Lung cancer screening. You may have this screening every year starting at age 36 if you have a 30-pack-year history of smoking and currently smoke or have quit within the past 15 years.  Fecal occult blood test (FOBT) of the stool. You may have this test every year starting at age 15.  Flexible sigmoidoscopy or colonoscopy. You may have a sigmoidoscopy every 5 years or a colonoscopy every 10 years starting at age 55.  Hepatitis C blood test.  Hepatitis B blood test.  Sexually transmitted disease (STD) testing.  Diabetes screening. This is done by checking your blood sugar (glucose) after you have not eaten for a while (fasting). You may have this done every 1-3 years.  Bone density scan. This is done to screen for osteoporosis. You may have this done starting at age 18.  Mammogram. This may be done every 1-2 years. Talk to your health care provider about how often you should have regular mammograms.  Talk with your health care provider about your test results, treatment options, and if necessary, the need for more tests. Vaccines  Your health care provider may recommend certain vaccines, such as:  Influenza vaccine. This is  recommended every year.  Tetanus, diphtheria, and acellular pertussis (Tdap, Td) vaccine. You may need a Td booster every 10 years.  Zoster vaccine. You may need this after age 44.  Pneumococcal 13-valent conjugate (PCV13) vaccine. One dose is recommended after age 60.  Pneumococcal polysaccharide (PPSV23) vaccine. One dose is recommended after age 4. Talk to your health care provider about which screenings and vaccines you need and how often you need them. This information is not intended to replace advice given to you by your health care provider. Make sure you discuss any questions you have with your health care provider. Document Released: 11/29/2015 Document Revised: 07/22/2016 Document Reviewed: 09/03/2015 Elsevier Interactive Patient Education  2017 [employer] Prevention in the Home Falls can cause injuries. They can happen to people of all ages. There are many things you can do to make your home safe and to help prevent falls. What can I do on the outside of my home?  Regularly fix the edges of walkways and driveways and fix any cracks.  Remove anything that might make you trip as you walk through a door, such as a raised step or threshold.  Trim any bushes or trees on the path to your home.  Use bright outdoor lighting.  Clear any walking paths of anything that might make someone trip, such as rocks or tools.  Regularly check to see if handrails are loose or broken. Make sure that both sides of any steps have handrails.  Any raised decks and porches should have guardrails on the edges.  Have any leaves, snow, or ice cleared regularly.  Use sand or salt on walking paths during winter.  Clean up any spills in your garage right away. This includes oil or grease spills. What can I do in the bathroom?  Use night lights.  Install grab bars by the toilet and in the tub and shower. Do not use towel bars as grab bars.  Use non-skid mats or decals in the tub or  shower.  If you need to sit down in the shower, use a plastic, non-slip stool.  Keep the floor dry. Clean up any water that spills on the floor as soon as it happens.  Remove soap buildup in the tub or shower regularly.  Attach bath mats securely with double-sided non-slip rug tape.  Do not have throw rugs and other things on the floor that can make you trip. What can I do in the bedroom?  Use night lights.  Make sure that you have a light by your bed that is easy to reach.  Do not use any sheets or blankets that are too big for your bed. They should not hang down onto the floor.  Have a firm chair that has side arms. You can use this for support while you get dressed.  Do not have throw rugs and other things on the floor that can make you trip. What can I do in the kitchen?  Clean up any spills right away.  Avoid walking on wet floors.  Keep items that you use a lot in easy-to-reach places.  If you need to reach something above you, use a strong step stool that has a grab bar.  Keep electrical cords out of the way.  Do not use floor polish or wax that makes floors slippery. If you must use wax, use non-skid floor wax.  Do not have throw rugs and other things on the floor that can make you trip. What can I do with my stairs?  Do not leave any items on the stairs.  Make sure that there are handrails on both sides of the stairs and use them. Fix handrails that are broken or loose. Make sure that handrails are as long as the stairways.  Check any carpeting to make sure that it is firmly attached to the stairs. Fix any carpet that is loose or worn.  Avoid having throw rugs at the top or bottom of the stairs. If you do have throw rugs, attach them to the floor with carpet tape.  Make sure that you have a light switch at the top of the stairs and the bottom of the stairs. If you do not have them, ask someone to add them for you. What else can I do to help prevent  falls?  Wear shoes that:  Do not have high heels.  Have rubber bottoms.  Are comfortable and fit you well.  Are closed at the toe. Do not wear sandals.  If you use a stepladder:  Make sure that it is fully opened. Do not climb a closed stepladder.  Make sure that both sides of the stepladder are locked into place.  Ask someone to hold it for you, if possible.  Clearly mark and make sure that you can see:  Any grab bars or handrails.  First and last steps.  Where the edge of each step is.  Use tools that help you move around (mobility aids) if they are needed. These include:  Canes.  Walkers.  Scooters.  Crutches.  Turn on the lights when you go into a dark area. Replace any light bulbs as soon as they burn out.  Set up your furniture so you have a clear path. Avoid moving your furniture around.  If any of your floors are uneven, fix them.  If there are any pets around you, be aware of where they are.  Review your medicines with your doctor. Some medicines can make you feel dizzy. This can increase your chance of falling. Ask your doctor what other things that you can do to help prevent falls. This information is not intended to replace advice given to you by your health care provider. Make sure you discuss any questions you have with your health care provider. Document Released: 08/29/2009 Document Revised: 04/09/2016 Document Reviewed: 12/07/2014 Elsevier Interactive Patient Education  2017 [employer].

## 2020-05-13 ENCOUNTER — Ambulatory Visit: Payer: Medicare Other

## 2020-05-13 ENCOUNTER — Encounter: Payer: Self-pay | Admitting: Physician Assistant

## 2020-05-13 ENCOUNTER — Other Ambulatory Visit: Payer: Self-pay

## 2020-05-13 ENCOUNTER — Ambulatory Visit (INDEPENDENT_AMBULATORY_CARE_PROVIDER_SITE_OTHER): Payer: Medicare Other | Admitting: Physician Assistant

## 2020-05-13 VITALS — BP 123/72 | HR 72 | Temp 97.5°F | Resp 16 | Ht 60.5 in | Wt 137.0 lb

## 2020-05-13 DIAGNOSIS — L299 Pruritus, unspecified: Secondary | ICD-10-CM

## 2020-05-13 DIAGNOSIS — N3281 Overactive bladder: Secondary | ICD-10-CM

## 2020-05-13 DIAGNOSIS — E119 Type 2 diabetes mellitus without complications: Secondary | ICD-10-CM

## 2020-05-13 DIAGNOSIS — E7800 Pure hypercholesterolemia, unspecified: Secondary | ICD-10-CM | POA: Diagnosis not present

## 2020-05-13 DIAGNOSIS — I10 Essential (primary) hypertension: Secondary | ICD-10-CM | POA: Diagnosis not present

## 2020-05-13 DIAGNOSIS — F5101 Primary insomnia: Secondary | ICD-10-CM | POA: Diagnosis not present

## 2020-05-13 LAB — POCT UA - MICROALBUMIN: Microalbumin Ur, POC: 50 mg/L

## 2020-05-13 LAB — POCT GLYCOSYLATED HEMOGLOBIN (HGB A1C)
Est. average glucose Bld gHb Est-mCnc: 134
Hemoglobin A1C: 6.3 % — AB (ref 4.0–5.6)

## 2020-05-13 MED ORDER — TEMAZEPAM 15 MG PO CAPS: 15.0000 mg | ORAL_CAPSULE | Freq: Every evening | ORAL | 1 refills | Status: DC | PRN

## 2020-05-13 MED ORDER — HYDROCHLOROTHIAZIDE 12.5 MG PO TABS: 12.5000 mg | ORAL_TABLET | ORAL | 3 refills | Status: DC

## 2020-05-13 MED ORDER — ATORVASTATIN CALCIUM 40 MG PO TABS: 40.0000 mg | ORAL_TABLET | Freq: Every day | ORAL | 3 refills | Status: DC

## 2020-05-13 MED ORDER — AMLODIPINE BESYLATE 10 MG PO TABS: 10.0000 mg | ORAL_TABLET | Freq: Every day | ORAL | 3 refills | Status: DC

## 2020-05-13 MED ORDER — GLIPIZIDE ER 10 MG PO TB24: 10.0000 mg | ORAL_TABLET | ORAL | 3 refills | Status: DC

## 2020-05-13 MED ORDER — OXYBUTYNIN CHLORIDE ER 10 MG PO TB24: 10.0000 mg | ORAL_TABLET | Freq: Every day | ORAL | 1 refills | Status: DC

## 2020-05-13 NOTE — Progress Notes (Signed)
Established patient visit   Patient: [name]   DOB: Mar 30, 1929   86 y.o. Female  MRN: [id number] Visit Date: 05/13/2020  Today's healthcare provider: Mar Daring, PA-C   Chief Complaint  Patient presents with   Follow-up    T2DM, HTN, Insomnia, Hypercholesterolemia   Subjective    HPI Patient had AWV with [facility] Fabio Neighbors, LPN 32/67/12 Diabetes Mellitus Type II, Follow-up  Lab Results  Component Value Date   HGBA1C 6.3 (A) 05/13/2020   HGBA1C 6.3 (A) 05/08/2019   HGBA1C 6.6 (A) 01/09/2019   Wt Readings from Last 3 Encounters:  05/13/20 137 lb (62.1 kg)  01/15/20 138 lb (62.6 kg)  11/02/19 138 lb (62.6 kg)   Last seen for diabetes 1 years ago.  Management since then includes Continue Glipizide XR 10mg  every other day She reports excellent compliance with treatment. She is not having side effects.  Symptoms: No fatigue No foot ulcerations  No appetite changes No nausea  No paresthesia of the feet  No polydipsia  No polyuria No visual disturbances   No vomiting     Home blood sugar records: not being checked  Episodes of hypoglycemia? No not being checked   Current insulin regiment: none Most Recent Eye Exam: past year.  Current exercise: none Current diet habits: in general, an "unhealthy" diet  Pertinent Labs: Lab Results  Component Value Date   CHOL 223 (H) 05/08/2019   HDL 45 05/08/2019   LDLCALC 123 (H) 05/08/2019   TRIG 277 (H) 05/08/2019   CHOLHDL 5.0 (H) 05/08/2019   Lab Results  Component Value Date   NA 144 05/08/2019   K 3.5 05/08/2019   CREATININE 0.80 05/08/2019   GFRNONAA 66 05/08/2019   GFRAA 76 05/08/2019   GLUCOSE 190 (H) 05/08/2019     --------------------------------------------------------------------------------------------------- Hypertension, follow-up  BP Readings from Last 3 Encounters:  05/13/20 123/72  01/15/20 124/70  11/02/19 138/80   Wt Readings from Last 3 Encounters:  05/13/20 137 lb  (62.1 kg)  01/15/20 138 lb (62.6 kg)  11/02/19 138 lb (62.6 kg)     She was last seen for hypertension 1 years ago.  BP at that visit was 137/74. Management since that visit includes continue current medication plan. Amlodipine 10mg  and Hydrochlorothiazide 12.5mg   She reports excellent compliance with treatment. She is not having side effects.   Outside blood pressures are not being checked. Symptoms: No chest pain No chest pressure  No palpitations No syncope  No dyspnea No orthopnea  No paroxysmal nocturnal dyspnea No lower extremity edema   Pertinent labs: Lab Results  Component Value Date   CHOL 223 (H) 05/08/2019   HDL 45 05/08/2019   LDLCALC 123 (H) 05/08/2019   TRIG 277 (H) 05/08/2019   CHOLHDL 5.0 (H) 05/08/2019   Lab Results  Component Value Date   NA 144 05/08/2019   K 3.5 05/08/2019   CREATININE 0.80 05/08/2019   GFRNONAA 66 05/08/2019   GFRAA 76 05/08/2019   GLUCOSE 190 (H) 05/08/2019     The ASCVD Risk score (Goff DC Jr., et al., 2013) failed to calculate for the following reasons:   The 2013 ASCVD risk score is only valid for ages 9 to 48   --------------------------------------------------------------------------------------------------- Pure hypercholesterolemia: Stable. Management since last visit includes continue current medication Atorvastatin 40mg .  Primary insomnia: she was seen for this a year ago. Management since that visit includes change from Temazepam 15mg  to Doxepin HCI 3mg . Reports that with  this medicine and the Benadryl is helping with her sleep. Reports that the Doxepin was very expensive.She went back to the [facility] that with this medicine and the Benadryl together is helping with her sleep.  Patient Active Problem List   Diagnosis Date Noted   Irritable bowel syndrome with diarrhea 04/20/2017   Diabetes mellitus without complication ([place]) 53/29/9242   Allergic rhinitis 08/05/2015   Aortic heart valve narrowing  08/05/2015   Breast cyst 08/05/2015   Carotid artery narrowing 08/05/2015   Colon polyp 08/05/2015   History of CVA (cerebrovascular accident) 08/05/2015   DD (diverticular disease) 08/05/2015   Elevated CK 08/05/2015   Acid reflux 08/05/2015   Headache disorder 08/05/2015   Calcium blood increased 08/05/2015   HLD (hyperlipidemia) 08/05/2015   BP (high blood pressure) 08/05/2015   Cannot sleep 08/05/2015   LBP (low back pain) 08/05/2015   OP (osteoporosis) 08/05/2015   Adiposity 08/05/2015   Abnormal Pap smear of vagina 08/05/2015   Hemorrhage, postmenopausal 08/05/2015   Superficial thrombophlebitis 08/05/2015   FOM (frequency of micturition) 08/05/2015   Fatigue 08/05/2015   Past Medical History:  Diagnosis Date   Cancer ([place])    skin   Diabetes mellitus without complication ([place])    type 2   Hyperlipidemia    Hypertension    IBS (irritable bowel syndrome)    Melanoma ([place])    Past Surgical History:  Procedure Laterality Date   BREAST BIOPSY Right    neg   BREAST SURGERY     1980's   CAROTID ARTERY ANGIOPLASTY Left 01/18/2015   Dr. Delana Meyer [facility]   CHOLECYSTECTOMY  06/27/2012   DECOMPRESSIVE LUMBAR LAMINECTOMY LEVEL 1  1950   EYE SURGERY Right 2012   cataract extraction   SKIN SURGERY     Social History   Tobacco Use   Smoking status: Former Smoker    Packs/day: 0.50    Quit date: 11/15/1976    Years since quitting: 43.5   Smokeless tobacco: Never Used  Vaping Use   Vaping Use: Never used  Substance Use Topics   Alcohol use: No   Drug use: No       Medications: Outpatient Medications Prior to Visit  Medication Sig   aspirin EC 81 MG tablet Take 81 mg by mouth daily.    cholecalciferol (VITAMIN D) 1000 UNITS tablet Take 1,000 Units by mouth daily.    clobetasol ointment (TEMOVATE) 0.05 % Apply to affected area every night for 4 weeks, then every other day for 4 weeks and then twice a week for 4 weeks or until  resolution.   erythromycin ophthalmic ointment Place 1 application into both eyes once. Reported on 03/25/2016   glucose blood (ONE TOUCH ULTRA TEST) test strip Check blood glucose once daily   OMEGA-3 FATTY ACIDS PO Take 1 capsule by mouth daily.    polyethylene glycol (MIRALAX / GLYCOLAX) packet Take 17 g by mouth daily.   triamcinolone cream (KENALOG) 0.1 % Apply 1 application topically 2 (two) times daily.   [DISCONTINUED] amLODipine (NORVASC) 10 MG tablet Take 1 tablet by mouth once daily   [DISCONTINUED] atorvastatin (LIPITOR) 40 MG tablet Take 1 tablet (40 mg total) by mouth daily at 6 PM.   [DISCONTINUED] glipiZIDE (GLUCOTROL XL) 10 MG 24 hr tablet Take 1 tablet (10 mg total) by mouth every other day.   [DISCONTINUED] hydrochlorothiazide (HYDRODIURIL) 12.5 MG tablet Take 1 tablet (12.5 mg total) by mouth every other day.   [DISCONTINUED] temazepam (RESTORIL) 15 MG capsule  Take 1 capsule (15 mg total) by mouth at bedtime as needed for sleep.   oxybutynin (DITROPAN) 5 MG tablet Take 1 tablet (5 mg total) by mouth daily. (Patient not taking: Reported on 05/13/2020)   No facility-administered medications prior to visit.    Review of Systems  Constitutional: Negative.   HENT: Negative.   Eyes: Positive for photophobia, discharge and redness.  Respiratory: Negative.   Cardiovascular: Negative.   Gastrointestinal: Positive for diarrhea.  Endocrine: Positive for polyuria.  Genitourinary: Negative.   Musculoskeletal: Negative.   Skin: Negative.   Allergic/Immunologic: Negative.   Neurological: Negative.   Hematological: Negative.   Psychiatric/Behavioral: Positive for sleep disturbance.    Last CBC Lab Results  Component Value Date   WBC 7.0 05/08/2019   HGB 14.9 05/08/2019   HCT 43.2 05/08/2019   MCV 86 05/08/2019   MCH 29.6 05/08/2019   RDW 12.7 05/08/2019   PLT 171 37/62/8315   Last metabolic panel Lab Results  Component Value Date   GLUCOSE 190 (H)  05/08/2019   NA 144 05/08/2019   K 3.5 05/08/2019   CL 103 05/08/2019   CO2 23 05/08/2019   BUN 14 05/08/2019   CREATININE 0.80 05/08/2019   GFRNONAA 66 05/08/2019   GFRAA 76 05/08/2019   CALCIUM 10.0 05/08/2019   PROT 7.0 05/08/2019   ALBUMIN 4.5 05/08/2019   LABGLOB 2.5 05/08/2019   AGRATIO 1.8 05/08/2019   BILITOT 0.6 05/08/2019   ALKPHOS 73 05/08/2019   AST 28 05/08/2019   ALT 29 05/08/2019   ANIONGAP 8 12/05/2016   Last lipids Lab Results  Component Value Date   CHOL 223 (H) 05/08/2019   HDL 45 05/08/2019   LDLCALC 123 (H) 05/08/2019   TRIG 277 (H) 05/08/2019   CHOLHDL 5.0 (H) 05/08/2019      Objective    BP 123/72 (BP Location: Left Arm, Patient Position: Sitting, Cuff Size: Normal)    Pulse 72    Temp (!) 97.5 F (36.4 C) (Temporal)    Resp 16    Ht 5' 0.5" (1.537 m)    Wt 137 lb (62.1 kg)    BMI 26.32 kg/m  BP Readings from Last 3 Encounters:  05/13/20 123/72  01/15/20 124/70  11/02/19 138/80   Wt Readings from Last 3 Encounters:  05/13/20 137 lb (62.1 kg)  01/15/20 138 lb (62.6 kg)  11/02/19 138 lb (62.6 kg)      Physical Exam Vitals reviewed.  Constitutional:      General: She is not in acute distress.    Appearance: Normal appearance. She is well-developed, well-groomed and overweight. She is not ill-appearing or diaphoretic.  HENT:     Head: Normocephalic and atraumatic.     Right Ear: Tympanic membrane, ear canal and external ear normal.     Left Ear: Tympanic membrane, ear canal and external ear normal.  Eyes:     General: No scleral icterus.       Right eye: No discharge.        Left eye: No discharge.     Extraocular Movements: Extraocular movements intact.     Conjunctiva/sclera: Conjunctivae normal.     Pupils: Pupils are equal, round, and reactive to light.  Neck:     Thyroid: No thyromegaly.     Vascular: No carotid bruit (can hear murmur referred) or JVD.     Trachea: No tracheal deviation.  Cardiovascular:     Rate and Rhythm:  Normal rate and regular rhythm.  Pulses: Normal pulses.     Heart sounds: Murmur (3/6) heard.  No friction rub. No gallop.   Pulmonary:     Effort: Pulmonary effort is normal. No respiratory distress.     Breath sounds: Normal breath sounds. No wheezing or rales.  Chest:     Chest wall: No tenderness.  Abdominal:     General: Abdomen is flat. Bowel sounds are normal. There is no distension.     Palpations: Abdomen is soft. There is no mass.     Tenderness: There is no abdominal tenderness. There is no guarding or rebound.  Musculoskeletal:        General: No tenderness. Normal range of motion.     Cervical back: Normal range of motion and neck supple.     Right lower leg: No edema.     Left lower leg: No edema.  Lymphadenopathy:     Cervical: No cervical adenopathy.  Skin:    General: Skin is warm and dry.     Capillary Refill: Capillary refill takes less than 2 seconds.     Findings: No rash.  Neurological:     General: No focal deficit present.     Mental Status: She is alert and oriented to person, place, and time. Mental status is at baseline.  Psychiatric:        Mood and Affect: Mood normal.        Behavior: Behavior normal. Behavior is cooperative.        Thought Content: Thought content normal.        Judgment: Judgment normal.     Diabetic Foot Exam - Simple   Simple Foot Form Visual Inspection No deformities, no ulcerations, no other skin breakdown bilaterally: Yes Sensation Testing Intact to touch and monofilament testing bilaterally: Yes Pulse Check Posterior Tibialis and Dorsalis pulse intact bilaterally: Yes Comments     Results for orders placed or performed in visit on 05/13/20  POCT glycosylated hemoglobin (Hb A1C)  Result Value Ref Range   Hemoglobin A1C 6.3 (A) 4.0 - 5.6 %   Est. average glucose Bld gHb Est-mCnc 134   POCT UA - Microalbumin  Result Value Ref Range   Microalbumin Ur, POC 50 mg/L    Assessment & Plan    1. Diabetes mellitus  without complication (HCC) Stable.A1C today was 6.3.Continue Glipizide XR 10mg  every other day. Will check labs as below and f/u pending results. - glipiZIDE (GLUCOTROL XL) 10 MG 24 hr tablet; Take 1 tablet (10 mg total) by mouth every other day.  Dispense: 45 tablet; Refill: 3 - CBC with Differential/Platelet - Comprehensive metabolic panel  2. Essential hypertension Stable.Diagnosis pulled for medication refill. Continue current medical treatment plan. Will check labs as below and f/u pending results - hydrochlorothiazide (HYDRODIURIL) 12.5 MG tablet; Take 1 tablet (12.5 mg total) by mouth every other day.  Dispense: 90 tablet; Refill: 3 - amLODipine (NORVASC) 10 MG tablet; Take 1 tablet (10 mg total) by mouth daily.  Dispense: 90 tablet; Refill: 3 - CBC with Differential/Platelet - Comprehensive metabolic panel  3. Pure hypercholesterolemia Stable. Diagnosis pulled for medication refill. Continue current medical treatment plan. Will check labs as below and f/u pending results - atorvastatin (LIPITOR) 40 MG tablet; Take 1 tablet (40 mg total) by mouth daily at 6 PM.  Dispense: 90 tablet; Refill: 3 - Lipid panel  4. Primary insomnia Stable. Continue Temazepam 15mg  and Benadryl nightly. Patient aware of dementia risk associated but declines wanting to change medications at this  time.  - temazepam (RESTORIL) 15 MG capsule; Take 1 capsule (15 mg total) by mouth at bedtime as needed for sleep.  Dispense: 90 capsule; Refill: 1  5. Itching Dermatology has requested [place] to check TSH for chronic scalp itching. She has tried multiple treatments and none have been effective.  - T4 AND TSH  6. OAB (overactive bladder) Had been on oxybutynin 5mg  and became ineffective. Will increase to 10mg  as below. Patient advised of adverse effects and issues with long term use and dementia. She wishes to try. Will stop if no improvement in symptoms.  - oxybutynin (DITROPAN XL) 10 MG 24 hr tablet; Take 1 tablet  (10 mg total) by mouth at bedtime.  Dispense: 90 tablet; Refill: 1 No follow-ups on file.      Reynolds Bowl, PA-C, have reviewed all documentation for this visit. The documentation on 05/13/20 for the exam, diagnosis, procedures, and orders are all accurate and complete.   Rubye Beach  [facility] [phone] (phone) [phone] (fax)  [place]

## 2020-05-14 ENCOUNTER — Other Ambulatory Visit: Payer: Self-pay | Admitting: Physician Assistant

## 2020-05-15 LAB — CBC WITH DIFFERENTIAL/PLATELET
Basophils Absolute: 0.1 10*3/uL (ref 0.0–0.2)
Basos: 1 %
EOS (ABSOLUTE): 0.1 10*3/uL (ref 0.0–0.4)
Eos: 1 %
Hematocrit: 43.9 % (ref 34.0–46.6)
Hemoglobin: 14.8 g/dL (ref 11.1–15.9)
Immature Grans (Abs): 0 10*3/uL (ref 0.0–0.1)
Immature Granulocytes: 0 %
Lymphocytes Absolute: 2.4 10*3/uL (ref 0.7–3.1)
Lymphs: 30 %
MCH: 30 pg (ref 26.6–33.0)
MCHC: 33.7 g/dL (ref 31.5–35.7)
MCV: 89 fL (ref 79–97)
Monocytes Absolute: 0.6 10*3/uL (ref 0.1–0.9)
Monocytes: 7 %
Neutrophils Absolute: 5 10*3/uL (ref 1.4–7.0)
Neutrophils: 61 %
Platelets: 159 10*3/uL (ref 150–450)
RBC: 4.94 x10E6/uL (ref 3.77–5.28)
RDW: 11.9 % (ref 11.7–15.4)
WBC: 8.2 10*3/uL (ref 3.4–10.8)

## 2020-05-15 LAB — LIPID PANEL
Chol/HDL Ratio: 4.2 ratio (ref 0.0–4.4)
Cholesterol, Total: 210 mg/dL — ABNORMAL HIGH (ref 100–199)
HDL: 50 mg/dL (ref >39)
LDL Chol Calc (NIH): 126 mg/dL — ABNORMAL HIGH (ref 0–99)
Triglycerides: 194 mg/dL — ABNORMAL HIGH (ref 0–149)
VLDL Cholesterol Cal: 34 mg/dL (ref 5–40)

## 2020-05-15 LAB — COMPREHENSIVE METABOLIC PANEL
ALT: 27 IU/L (ref 0–32)
AST: 22 IU/L (ref 0–40)
Albumin/Globulin Ratio: 2 (ref 1.2–2.2)
Albumin: 4.7 g/dL — ABNORMAL HIGH (ref 3.6–4.6)
Alkaline Phosphatase: 76 IU/L (ref 48–121)
BUN/Creatinine Ratio: 19 (ref 12–28)
BUN: 14 mg/dL (ref 8–27)
Bilirubin Total: 0.8 mg/dL (ref 0.0–1.2)
CO2: 23 mmol/L (ref 20–29)
Calcium: 9.6 mg/dL (ref 8.7–10.3)
Chloride: 102 mmol/L (ref 96–106)
Creatinine, Ser: 0.75 mg/dL (ref 0.57–1.00)
GFR calc Af Amer: 82 mL/min/{1.73_m2} (ref >59)
GFR calc non Af Amer: 71 mL/min/{1.73_m2} (ref >59)
Globulin, Total: 2.3 g/dL (ref 1.5–4.5)
Glucose: 147 mg/dL — ABNORMAL HIGH (ref 65–99)
Potassium: 4.3 mmol/L (ref 3.5–5.2)
Sodium: 141 mmol/L (ref 134–144)
Total Protein: 7 g/dL (ref 6.0–8.5)

## 2020-05-15 LAB — T4 AND TSH
T4, Total: 6.4 ug/dL (ref 4.5–12.0)
TSH: 2.19 u[IU]/mL (ref 0.450–4.500)

## 2020-05-16 ENCOUNTER — Telehealth: Payer: Self-pay

## 2020-05-16 NOTE — Telephone Encounter (Signed)
Patient advised as below. Patient verbalizes understanding and is in agreement with treatment plan.  

## 2020-05-16 NOTE — Telephone Encounter (Signed)
-----   Message from Mar Daring, [place] sent at 05/15/2020 10:17 AM EDT ----- Blood count is normal. Kidney and liver function are normal. Sodium, potassium and calcium are normal. Cholesterol has improved compared to last year. Continue Atorvastatin 40mg . Thyroid is normal.

## 2020-06-10 DIAGNOSIS — M25511 Pain in right shoulder: Secondary | ICD-10-CM | POA: Diagnosis not present

## 2020-06-17 DIAGNOSIS — D049 Carcinoma in situ of skin, unspecified: Secondary | ICD-10-CM | POA: Diagnosis not present

## 2020-08-12 DIAGNOSIS — Z23 Encounter for immunization: Secondary | ICD-10-CM | POA: Diagnosis not present

## 2020-09-12 DIAGNOSIS — Z23 Encounter for immunization: Secondary | ICD-10-CM | POA: Diagnosis not present

## 2020-09-17 DIAGNOSIS — D0461 Carcinoma in situ of skin of right upper limb, including shoulder: Secondary | ICD-10-CM | POA: Diagnosis not present

## 2020-09-17 DIAGNOSIS — D485 Neoplasm of uncertain behavior of skin: Secondary | ICD-10-CM | POA: Diagnosis not present

## 2020-09-17 DIAGNOSIS — D0439 Carcinoma in situ of skin of other parts of face: Secondary | ICD-10-CM | POA: Diagnosis not present

## 2020-09-17 DIAGNOSIS — D2262 Melanocytic nevi of left upper limb, including shoulder: Secondary | ICD-10-CM | POA: Diagnosis not present

## 2020-09-17 DIAGNOSIS — D2272 Melanocytic nevi of left lower limb, including hip: Secondary | ICD-10-CM | POA: Diagnosis not present

## 2020-09-17 DIAGNOSIS — D2261 Melanocytic nevi of right upper limb, including shoulder: Secondary | ICD-10-CM | POA: Diagnosis not present

## 2020-09-17 DIAGNOSIS — X32XXXA Exposure to sunlight, initial encounter: Secondary | ICD-10-CM | POA: Diagnosis not present

## 2020-09-17 DIAGNOSIS — L218 Other seborrheic dermatitis: Secondary | ICD-10-CM | POA: Diagnosis not present

## 2020-09-17 DIAGNOSIS — D225 Melanocytic nevi of trunk: Secondary | ICD-10-CM | POA: Diagnosis not present

## 2020-09-17 DIAGNOSIS — L570 Actinic keratosis: Secondary | ICD-10-CM | POA: Diagnosis not present

## 2020-09-17 DIAGNOSIS — Z85828 Personal history of other malignant neoplasm of skin: Secondary | ICD-10-CM | POA: Diagnosis not present

## 2020-09-17 DIAGNOSIS — L821 Other seborrheic keratosis: Secondary | ICD-10-CM | POA: Diagnosis not present

## 2020-09-26 DIAGNOSIS — E119 Type 2 diabetes mellitus without complications: Secondary | ICD-10-CM | POA: Diagnosis not present

## 2020-09-26 DIAGNOSIS — R198 Other specified symptoms and signs involving the digestive system and abdomen: Secondary | ICD-10-CM | POA: Diagnosis not present

## 2020-09-26 DIAGNOSIS — Z8673 Personal history of transient ischemic attack (TIA), and cerebral infarction without residual deficits: Secondary | ICD-10-CM | POA: Diagnosis not present

## 2020-09-26 DIAGNOSIS — Z86010 Personal history of colonic polyps: Secondary | ICD-10-CM | POA: Diagnosis not present

## 2020-09-27 DIAGNOSIS — R198 Other specified symptoms and signs involving the digestive system and abdomen: Secondary | ICD-10-CM | POA: Diagnosis not present

## 2020-11-04 ENCOUNTER — Other Ambulatory Visit: Payer: Self-pay | Admitting: Gastroenterology

## 2020-11-04 DIAGNOSIS — R1032 Left lower quadrant pain: Secondary | ICD-10-CM | POA: Diagnosis not present

## 2020-11-04 DIAGNOSIS — R102 Pelvic and perineal pain: Secondary | ICD-10-CM | POA: Diagnosis not present

## 2020-11-04 DIAGNOSIS — R1031 Right lower quadrant pain: Secondary | ICD-10-CM | POA: Diagnosis not present

## 2020-11-04 DIAGNOSIS — R198 Other specified symptoms and signs involving the digestive system and abdomen: Secondary | ICD-10-CM | POA: Diagnosis not present

## 2020-11-04 DIAGNOSIS — Z86010 Personal history of colonic polyps: Secondary | ICD-10-CM | POA: Diagnosis not present

## 2020-11-14 ENCOUNTER — Ambulatory Visit: Payer: Medicare Other

## 2020-11-18 DIAGNOSIS — D2339 Other benign neoplasm of skin of other parts of face: Secondary | ICD-10-CM | POA: Diagnosis not present

## 2020-11-25 DIAGNOSIS — D0461 Carcinoma in situ of skin of right upper limb, including shoulder: Secondary | ICD-10-CM | POA: Diagnosis not present

## 2020-11-26 ENCOUNTER — Ambulatory Visit
Admission: RE | Admit: 2020-11-26 | Discharge: 2020-11-26 | Disposition: A | Discharge status: Home / Self Care | Payer: Medicare Other | Source: Ambulatory Visit | Attending: Gastroenterology | Admitting: Gastroenterology

## 2020-11-26 ENCOUNTER — Other Ambulatory Visit: Payer: Self-pay | Admitting: Gastroenterology

## 2020-11-26 ENCOUNTER — Other Ambulatory Visit: Payer: Self-pay

## 2020-11-26 DIAGNOSIS — R102 Pelvic and perineal pain: Secondary | ICD-10-CM

## 2020-11-26 DIAGNOSIS — R1031 Right lower quadrant pain: Secondary | ICD-10-CM

## 2020-11-26 DIAGNOSIS — Z780 Asymptomatic menopausal state: Secondary | ICD-10-CM | POA: Diagnosis not present

## 2020-11-26 DIAGNOSIS — R1032 Left lower quadrant pain: Secondary | ICD-10-CM | POA: Diagnosis not present

## 2020-12-16 DIAGNOSIS — I350 Nonrheumatic aortic (valve) stenosis: Secondary | ICD-10-CM | POA: Diagnosis not present

## 2020-12-16 DIAGNOSIS — I6529 Occlusion and stenosis of unspecified carotid artery: Secondary | ICD-10-CM | POA: Diagnosis not present

## 2020-12-16 DIAGNOSIS — I10 Essential (primary) hypertension: Secondary | ICD-10-CM | POA: Diagnosis not present

## 2020-12-16 DIAGNOSIS — E782 Mixed hyperlipidemia: Secondary | ICD-10-CM | POA: Diagnosis not present

## 2020-12-16 DIAGNOSIS — E119 Type 2 diabetes mellitus without complications: Secondary | ICD-10-CM | POA: Diagnosis not present

## 2020-12-16 DIAGNOSIS — Z8673 Personal history of transient ischemic attack (TIA), and cerebral infarction without residual deficits: Secondary | ICD-10-CM | POA: Diagnosis not present

## 2021-01-27 DIAGNOSIS — I350 Nonrheumatic aortic (valve) stenosis: Secondary | ICD-10-CM | POA: Diagnosis not present

## 2021-01-31 ENCOUNTER — Other Ambulatory Visit: Payer: Self-pay

## 2021-01-31 DIAGNOSIS — F5101 Primary insomnia: Secondary | ICD-10-CM

## 2021-01-31 MED ORDER — TEMAZEPAM 15 MG PO CAPS
15.0000 mg | ORAL_CAPSULE | Freq: Every evening | ORAL | 1 refills | Status: DC | PRN
Start: 2021-01-31 — End: 2021-06-10

## 2021-01-31 NOTE — Telephone Encounter (Signed)
[facility] faxed refill request for the following medications:  temazepam (RESTORIL) 15 MG capsule   Please advise. Thanks, [employer]

## 2021-02-19 ENCOUNTER — Other Ambulatory Visit: Payer: Self-pay

## 2021-02-19 ENCOUNTER — Ambulatory Visit (INDEPENDENT_AMBULATORY_CARE_PROVIDER_SITE_OTHER): Payer: Medicare Other

## 2021-02-19 ENCOUNTER — Ambulatory Visit
Admission: EM | Admit: 2021-02-19 | Discharge: 2021-02-19 | Disposition: A | Discharge status: Home / Self Care | Payer: Medicare Other | Attending: Family Medicine | Admitting: Family Medicine

## 2021-02-19 ENCOUNTER — Ambulatory Visit: Payer: Self-pay | Admitting: *Deleted

## 2021-02-19 ENCOUNTER — Encounter: Payer: Self-pay | Admitting: Emergency Medicine

## 2021-02-19 DIAGNOSIS — R059 Cough, unspecified: Secondary | ICD-10-CM | POA: Diagnosis not present

## 2021-02-19 DIAGNOSIS — R053 Chronic cough: Secondary | ICD-10-CM | POA: Diagnosis not present

## 2021-02-19 DIAGNOSIS — J22 Unspecified acute lower respiratory infection: Secondary | ICD-10-CM | POA: Diagnosis not present

## 2021-02-19 MED ORDER — LEVOFLOXACIN 250 MG PO TABS
250.0000 mg | ORAL_TABLET | Freq: Every day | ORAL | 0 refills | Status: DC
Start: 2021-02-19 — End: 2021-06-10

## 2021-02-19 MED ORDER — AZITHROMYCIN 250 MG PO TABS: ORAL_TABLET | ORAL | 0 refills | Status: DC

## 2021-02-19 MED ORDER — BENZONATATE 100 MG PO CAPS: 100.0000 mg | ORAL_CAPSULE | Freq: Three times a day (TID) | ORAL | 0 refills | Status: DC | PRN

## 2021-02-19 NOTE — ED Provider Notes (Signed)
[name]    CSN: [id number] Arrival date & time: 02/19/21  1553      History   Chief Complaint Chief Complaint  Patient presents with  . Cough    HPI [name] is a 87 y.o. female.   HPI Patient with a medical history significant for type 2 diabetes, hypertension,  presents with several days of cough which has worsened over the last few days to include chest congestion and is more persistent. Denies chest pain or shortness of breath.  Of note patient's oxygen level is less than baseline and she is mildly tachypneic with exertion. Denies any nasal symptoms. Attempted relief with cough drops without improvement. Past Medical History:  Diagnosis Date  . Cancer ([place])    skin  . Diabetes mellitus without complication ([place])    type 2  . Hyperlipidemia   . Hypertension   . IBS (irritable bowel syndrome)   . Melanoma [facility])     Patient Active Problem List   Diagnosis Date Noted  . Irritable bowel syndrome with diarrhea 04/20/2017  . Diabetes mellitus without complication ([place]) 29/47/6546  . Allergic rhinitis 08/05/2015  . Aortic heart valve narrowing 08/05/2015  . Breast cyst 08/05/2015  . Carotid artery narrowing 08/05/2015  . Colon polyp 08/05/2015  . History of CVA (cerebrovascular accident) 08/05/2015  . DD (diverticular disease) 08/05/2015  . Elevated CK 08/05/2015  . Acid reflux 08/05/2015  . Headache disorder 08/05/2015  . Calcium blood increased 08/05/2015  . HLD (hyperlipidemia) 08/05/2015  . BP (high blood pressure) 08/05/2015  . Cannot sleep 08/05/2015  . LBP (low back pain) 08/05/2015  . OP (osteoporosis) 08/05/2015  . Adiposity 08/05/2015  . Abnormal Pap smear of vagina 08/05/2015  . Hemorrhage, postmenopausal 08/05/2015  . Superficial thrombophlebitis 08/05/2015  . FOM (frequency of micturition) 08/05/2015  . Fatigue 08/05/2015    Past Surgical History:  Procedure Laterality Date  . BREAST BIOPSY Right    neg  . BREAST SURGERY      1980's  . CAROTID ARTERY ANGIOPLASTY Left 01/18/2015   Dr. Delana Meyer [facility]  . CHOLECYSTECTOMY  06/27/2012  . DECOMPRESSIVE LUMBAR LAMINECTOMY LEVEL 1  1950  . EYE SURGERY Right 2012   cataract extraction  . SKIN SURGERY      OB History   No obstetric history on file.      Home Medications    Prior to Admission medications   Medication Sig Start Date End Date Taking? Authorizing Provider  benzonatate (TESSALON) 100 MG capsule Take 1 capsule (100 mg total) by mouth 3 (three) times daily as needed for cough. 02/19/21  Yes Scot Jun, FNP  levofloxacin (LEVAQUIN) 250 MG tablet Take 1 tablet (250 mg total) by mouth daily. 02/19/21  Yes Scot Jun, FNP  amLODipine (NORVASC) 10 MG tablet Take 1 tablet (10 mg total) by mouth daily. 05/13/20   Mar Daring, PA-C  aspirin EC 81 MG tablet Take 81 mg by mouth daily.     [provider]  atorvastatin (LIPITOR) 40 MG tablet Take 1 tablet (40 mg total) by mouth daily at 6 PM. 05/13/20   Burnette, Clearnce Sorrel, PA-C  cholecalciferol (VITAMIN D) 1000 UNITS tablet Take 1,000 Units by mouth daily.     [provider]  clobetasol ointment (TEMOVATE) 0.05 % Apply to affected area every night for 4 weeks, then every other day for 4 weeks and then twice a week for 4 weeks or until resolution. 11/02/19   Schuman, Renold Don  R, MD  erythromycin ophthalmic ointment Place 1 application into both eyes once. Reported on 03/25/2016    [provider]  glipiZIDE (GLUCOTROL XL) 10 MG 24 hr tablet Take 1 tablet (10 mg total) by mouth every other day. 05/13/20   Mar Daring, PA-C  glucose blood (ONE TOUCH ULTRA TEST) test strip Check blood glucose once daily 08/17/16   [name] M, PA-C  hydrochlorothiazide (HYDRODIURIL) 12.5 MG tablet Take 1 tablet (12.5 mg total) by mouth every other day. 05/13/20   Mar Daring, PA-C  OMEGA-3 FATTY ACIDS PO Take 1 capsule by mouth daily.     [provider]   oxybutynin (DITROPAN XL) 10 MG 24 hr tablet Take 1 tablet (10 mg total) by mouth at bedtime. 05/13/20   Mar Daring, PA-C  polyethylene glycol (MIRALAX / GLYCOLAX) packet Take 17 g by mouth daily.    [provider]  temazepam (RESTORIL) 15 MG capsule Take 1 capsule (15 mg total) by mouth at bedtime as needed for sleep. 01/31/21   Mar Daring, PA-C  triamcinolone cream (KENALOG) 0.1 % Apply 1 application topically 2 (two) times daily. 07/03/19   Mar Daring, PA-C    Family History Family History  Problem Relation Age of Onset  . Ovarian cancer Mother   . Heart disease Brother   . Heart attack Brother   . Gout Brother   . Alzheimer's disease Brother   . Dementia Brother   . Heart attack Brother   . Diabetes Daughter   . Melanoma Daughter   . Stroke Son   . Breast cancer Neg Hx     Social History Social History   Tobacco Use  . Smoking status: Former Smoker    Packs/day: 0.50    Quit date: 11/15/1976    Years since quitting: 44.2  . Smokeless tobacco: Never Used  Vaping Use  . Vaping Use: Never used  Substance Use Topics  . Alcohol use: No  . Drug use: No     Allergies   Cephalosporins, Codeine, Macrolides and ketolides, Penicillins, Tramadol, Doxycycline, and Sulfa antibiotics   Review of Systems Review of Systems Pertinent negatives listed in HPI  Physical Exam Triage Vital Signs ED Triage Vitals  Enc Vitals Group     BP 02/19/21 1557 (!) 156/88     Pulse Rate 02/19/21 1557 91     Resp 02/19/21 1557 20     Temp 02/19/21 1557 98.2 F (36.8 C)     Temp Source 02/19/21 1557 Oral     SpO2 02/19/21 1557 94 %     Weight --      Height --      Head Circumference --      Peak Flow --      Pain Score 02/19/21 1559 0     Pain Loc --      Pain Edu? --      Excl. in [place]? --    No data found.  Updated Vital Signs BP (!) 156/88 (BP Location: Left Arm)   Pulse 91   Temp 98.2 F (36.8 C) (Oral)   Resp 20   SpO2 94%    Visual Acuity Right Eye Distance:   Left Eye Distance:   Bilateral Distance:    Right Eye Near:   Left Eye Near:    Bilateral Near:     Physical Exam Constitutional:      General: She is not in acute distress.    Appearance:  She is ill-appearing. She is not toxic-appearing.  Cardiovascular:     Rate and Rhythm: Normal rate and regular rhythm.  Pulmonary:     Effort: No respiratory distress.     Breath sounds: Rhonchi and rales present.     Comments: Rales bilateral mid lung fields  Chest:     Chest wall: No tenderness.  Abdominal:     General: Bowel sounds are normal.     Palpations: Abdomen is soft.  Musculoskeletal:     Cervical back: Normal range of motion and neck supple.  Skin:    General: Skin is warm and dry.     Capillary Refill: Capillary refill takes less than 2 seconds.  Neurological:     General: No focal deficit present.     Mental Status: She is alert and oriented to person, place, and time.  Psychiatric:        Mood and Affect: Mood normal.        Thought Content: Thought content normal.        Judgment: Judgment normal.      UC Treatments / Results  Labs (all labs ordered are listed, but only abnormal results are displayed) Labs Reviewed - No data to display  EKG   Radiology DG Chest 2 View  Result Date: 02/19/2021 CLINICAL DATA:  Persistent cough EXAM: CHEST - 2 VIEW COMPARISON:  None. FINDINGS: The heart size and mediastinal contours are within normal limits. Aortic knob calcifications are seen. Both lungs are clear. The visualized skeletal structures are unremarkable. IMPRESSION: No active cardiopulmonary disease. Electronically Signed   By: Prudencio Pair M.D.   On: 02/19/2021 16:22    Procedures Procedures (including critical care time)  Medications Ordered in UC Medications - No data to display  Initial Impression / Assessment and Plan / UC Course  I have reviewed the triage vital signs and the nursing notes.  Pertinent labs &  imaging results that were available during my care of the patient were reviewed by me and considered in my medical decision making (see chart for details).     Covering for a lower respiratory infection, concern for middle lobular pneumonia. Patient with multiple allergies to antibiotics, will cover with Levaquin 250 mg (GFR > 60 per most recent labs).  For cough.  Discussed with patient her symptoms worsen such as increased shortness of breath any chest pressure chest tightness or any sudden awakening immediately to the ER for evaluation. Imaging negative for any acute pneumonia however given exam findings concerning for early developing pneumonia, and patient's associated medical history with comorbid risk factors will continue therapy as prescribed. Final Clinical Impressions(s) / UC Diagnoses   Final diagnoses:  Lower respiratory infection (e.g., bronchitis, pneumonia, pneumonitis, pulmonitis)  Persistent cough     Discharge Instructions     I am treating you for a middle lobe pneumonia per my initial review of the x-ray.  If any other abnormal findings is noted on imaging after review by radiology I will notify you via MyChart.  You will take Levaquin 250 mg once daily for the next 7 days.  For cough I prescribed you benzonatate.  If anytime you develop any shortness of breath or chest tightness go immediately to the nearest emergency department.   ED Prescriptions    Medication Sig Dispense Auth. Provider   azithromycin (ZITHROMAX) 250 MG tablet  (Status: Discontinued) Take 2 tabs PO x 1 dose, then 1 tab PO QD x 4 days 6 tablet Scot Jun, FNP  benzonatate (TESSALON) 100 MG capsule Take 1 capsule (100 mg total) by mouth 3 (three) times daily as needed for cough. 40 capsule Scot Jun, FNP   levofloxacin (LEVAQUIN) 250 MG tablet Take 1 tablet (250 mg total) by mouth daily. 7 tablet Scot Jun, FNP     PDMP not reviewed this encounter.   Scot Jun,  FNP 02/20/21 [phone]

## 2021-02-19 NOTE — ED Triage Notes (Signed)
Patient c/o nonproductive cough and "chest congestion" x 1 day.   Patient denies fever, sinus congestion, ear pain, sore throat.   Patient denies chest pain or SOB.   Patient endorses a headache.   Patient denies any previous episode of these symptoms.   Patient denies tobacco use.   Patient used cough drops with no relief of symptoms.

## 2021-02-19 NOTE — Telephone Encounter (Signed)
Follow up to previous note; the pt says she is scheduled to be seen 02/20/21 at [facility]; spoke with pt and confirmed the appt was at [facility]; she says it was scheduled by a female but she does not remember his name; no appt noted;  Spoke with [facility] flow coordinator and she does not see appt scheduled other than 05/12/21; pt declined virtual appt with Kelsea Just on 02/19/21 at 14; she would like to be seen in the office; the pt can be contacted at [phone]; will route to office for final disposition.

## 2021-02-19 NOTE — Discharge Instructions (Addendum)
I am treating you for a middle lobe pneumonia per my initial review of the x-ray.  If any other abnormal findings is noted on imaging after review by radiology I will notify you via MyChart.  You will take Levaquin 250 mg once daily for the next 7 days.  For cough I prescribed you benzonatate.  If anytime you develop any shortness of breath or chest tightness go immediately to the nearest emergency department.

## 2021-02-19 NOTE — Telephone Encounter (Signed)
Pt called with complaints of a cough that started on 02/18/21; she says it is non-productive and she can feel it in her chest because she can not cough it up; she denies shortness of breath, fever, and swelling; recommendations made per nurse triage protocol; she verbalized understanding; the pt says she has an appt to be seen on 02/20/21 at 0840; she is seen a [employer]; will route to office for notification.    Reason for Disposition . SEVERE coughing spells (e.g., whooping sound after coughing, vomiting after coughing)  Answer Assessment - Initial Assessment Questions 1. ONSET: "When did the cough begin?"      02/18/21 2. SEVERITY: "How bad is the cough today?"      Moderate; kept waking up due to cough 3. SPUTUM: "Describe the color of your sputum" (none, dry cough; clear, white, yellow, green)     no 4. HEMOPTYSIS: "Are you coughing up any blood?" If so ask: "How much?" (flecks, streaks, tablespoons, etc.)     no 5. DIFFICULTY BREATHING: "Are you having difficulty breathing?" If Yes, ask: "How bad is it?" (e.g., mild, moderate, severe)    - MILD: No SOB at rest, mild SOB with walking, speaks normally in sentences, can lay down, no retractions, pulse < 100.    - MODERATE: SOB at rest, SOB with minimal exertion and prefers to sit, cannot lie down flat, speaks in phrases, mild retractions, audible wheezing, pulse 100-120.    - SEVERE: Very SOB at rest, speaks in single words, struggling to breathe, sitting hunched forward, retractions, pulse > 120      Denies shortness of breath 6. FEVER: "Do you have a fever?" If Yes, ask: "What is your temperature, how was it measured, and when did it start?"     no 7. CARDIAC HISTORY: "Do you have any history of heart disease?" (e.g., heart attack, congestive heart failure)      HTN 8. LUNG HISTORY: "Do you have any history of lung disease?"  (e.g., pulmonary embolus, asthma, emphysema)     no 9. PE RISK FACTORS: "Do you have a history of  blood clots?" (or: recent major surgery, recent prolonged travel, bedridden)     no 10. OTHER SYMPTOMS: "Do you have any other symptoms?" (e.g., runny nose, wheezing, chest pain)     Chest discomfort due to non productive cough; Runny nose with clear secretions 11. PREGNANCY: "Is there any chance you are pregnant?" "When was your last menstrual period?"      no 12. TRAVEL: "Have you traveled out of the country in the last month?" (e.g., travel history, exposures)       no  Protocols used: COUGH - ACUTE NON-PRODUCTIVE-A-AH

## 2021-02-19 NOTE — Telephone Encounter (Signed)
Sadly, I am not sure we have any availability in person due to being so short staffed.   May need a virtual or to go to [facility]

## 2021-02-20 NOTE — Telephone Encounter (Signed)
Patient reports that she was seen at the [facility] yesterday. Patient will call if not improving.

## 2021-04-17 DIAGNOSIS — M79674 Pain in right toe(s): Secondary | ICD-10-CM | POA: Diagnosis not present

## 2021-04-17 DIAGNOSIS — L600 Ingrowing nail: Secondary | ICD-10-CM | POA: Diagnosis not present

## 2021-04-17 DIAGNOSIS — M2012 Hallux valgus (acquired), left foot: Secondary | ICD-10-CM | POA: Diagnosis not present

## 2021-04-17 DIAGNOSIS — B351 Tinea unguium: Secondary | ICD-10-CM | POA: Diagnosis not present

## 2021-04-17 DIAGNOSIS — M2011 Hallux valgus (acquired), right foot: Secondary | ICD-10-CM | POA: Diagnosis not present

## 2021-04-17 DIAGNOSIS — M79675 Pain in left toe(s): Secondary | ICD-10-CM | POA: Diagnosis not present

## 2021-05-12 DIAGNOSIS — Z23 Encounter for immunization: Secondary | ICD-10-CM | POA: Diagnosis not present

## 2021-05-26 DIAGNOSIS — D2271 Melanocytic nevi of right lower limb, including hip: Secondary | ICD-10-CM | POA: Diagnosis not present

## 2021-05-26 DIAGNOSIS — D2262 Melanocytic nevi of left upper limb, including shoulder: Secondary | ICD-10-CM | POA: Diagnosis not present

## 2021-05-26 DIAGNOSIS — Z86006 Personal history of melanoma in-situ: Secondary | ICD-10-CM | POA: Diagnosis not present

## 2021-05-26 DIAGNOSIS — L570 Actinic keratosis: Secondary | ICD-10-CM | POA: Diagnosis not present

## 2021-05-26 DIAGNOSIS — X32XXXA Exposure to sunlight, initial encounter: Secondary | ICD-10-CM | POA: Diagnosis not present

## 2021-05-26 DIAGNOSIS — Z85828 Personal history of other malignant neoplasm of skin: Secondary | ICD-10-CM | POA: Diagnosis not present

## 2021-05-26 DIAGNOSIS — D225 Melanocytic nevi of trunk: Secondary | ICD-10-CM | POA: Diagnosis not present

## 2021-05-26 DIAGNOSIS — D2261 Melanocytic nevi of right upper limb, including shoulder: Secondary | ICD-10-CM | POA: Diagnosis not present

## 2021-06-10 ENCOUNTER — Ambulatory Visit (INDEPENDENT_AMBULATORY_CARE_PROVIDER_SITE_OTHER): Payer: Medicare Other | Admitting: Family Medicine

## 2021-06-10 ENCOUNTER — Other Ambulatory Visit: Payer: Self-pay

## 2021-06-10 ENCOUNTER — Encounter: Payer: Self-pay | Admitting: Family Medicine

## 2021-06-10 VITALS — BP 133/71 | HR 102 | Resp 16 | Ht 62.0 in | Wt 141.1 lb

## 2021-06-10 DIAGNOSIS — I10 Essential (primary) hypertension: Secondary | ICD-10-CM

## 2021-06-10 DIAGNOSIS — N898 Other specified noninflammatory disorders of vagina: Secondary | ICD-10-CM

## 2021-06-10 DIAGNOSIS — E119 Type 2 diabetes mellitus without complications: Secondary | ICD-10-CM

## 2021-06-10 DIAGNOSIS — F5101 Primary insomnia: Secondary | ICD-10-CM | POA: Diagnosis not present

## 2021-06-10 DIAGNOSIS — Z0000 Encounter for general adult medical examination without abnormal findings: Secondary | ICD-10-CM

## 2021-06-10 DIAGNOSIS — R011 Cardiac murmur, unspecified: Secondary | ICD-10-CM | POA: Diagnosis not present

## 2021-06-10 DIAGNOSIS — B3789 Other sites of candidiasis: Secondary | ICD-10-CM | POA: Diagnosis not present

## 2021-06-10 DIAGNOSIS — I350 Nonrheumatic aortic (valve) stenosis: Secondary | ICD-10-CM | POA: Diagnosis not present

## 2021-06-10 DIAGNOSIS — E7800 Pure hypercholesterolemia, unspecified: Secondary | ICD-10-CM

## 2021-06-10 DIAGNOSIS — L309 Dermatitis, unspecified: Secondary | ICD-10-CM

## 2021-06-10 HISTORY — DX: Dermatitis, unspecified: L30.9

## 2021-06-10 MED ORDER — NYSTATIN 100000 UNIT/GM EX POWD: 1.0000 "application " | Freq: Three times a day (TID) | CUTANEOUS | 3 refills | Status: DC

## 2021-06-10 MED ORDER — TRAZODONE HCL 50 MG PO TABS: 25.0000 mg | ORAL_TABLET | Freq: Every evening | ORAL | 3 refills | Status: DC | PRN

## 2021-06-10 MED ORDER — GLIPIZIDE ER 10 MG PO TB24: 10.0000 mg | ORAL_TABLET | ORAL | 3 refills | Status: DC

## 2021-06-10 MED ORDER — HYDROCHLOROTHIAZIDE 12.5 MG PO TABS: 12.5000 mg | ORAL_TABLET | ORAL | 3 refills | Status: DC

## 2021-06-10 MED ORDER — PREMARIN 0.625 MG/GM VA CREA: 1.0000 | TOPICAL_CREAM | VAGINAL | 3 refills | Status: DC

## 2021-06-10 MED ORDER — AMLODIPINE BESYLATE 10 MG PO TABS: 10.0000 mg | ORAL_TABLET | Freq: Every day | ORAL | 3 refills | Status: DC

## 2021-06-10 MED ORDER — ATORVASTATIN CALCIUM 40 MG PO TABS: 40.0000 mg | ORAL_TABLET | Freq: Every day | ORAL | 3 refills | Status: DC

## 2021-06-10 MED ORDER — NYSTATIN-TRIAMCINOLONE 100000-0.1 UNIT/GM-% EX OINT: 1.0000 "application " | TOPICAL_OINTMENT | Freq: Two times a day (BID) | CUTANEOUS | 1 refills | Status: DC

## 2021-06-10 MED ORDER — TEMAZEPAM 15 MG PO CAPS: 15.0000 mg | ORAL_CAPSULE | Freq: Every evening | ORAL | 1 refills | Status: DC | PRN

## 2021-06-10 MED ORDER — CLOBETASOL PROPIONATE 0.05 % EX OINT: TOPICAL_OINTMENT | CUTANEOUS | 5 refills | Status: DC

## 2021-06-10 NOTE — Progress Notes (Signed)
Complete physical exam   Patient: [name]   DOB: 06/29/1929   87 y.o. Female  MRN: [id number] Visit Date: 06/10/2021  Today's healthcare provider: Gwyneth Sprout, FNP   Chief Complaint  Patient presents with   Medicare Wellness   Subjective     HPI  [name] is a 87 y.o. female who presents today for a complete physical exam.  She reports consuming a poor diet, patient reports she eats one meal a day. The patient does not participate in regular exercise at present. She generally feels poorly. She reports sleeping poorly patient reports taking Temazepam and benadryl to help her sleep at night and states that her sleep schedule is still poor. She does not have additional problems to discuss today.  Last AWV- 05/09/20  Past Medical History:  Diagnosis Date   Acid reflux 08/05/2015   Cancer ([place])    skin   Colon polyp 08/05/2015   DD (diverticular disease) 08/05/2015   Diabetes mellitus without complication ([place])    type 2   Eczema of both upper extremities 06/10/2021   Hyperlipidemia    Hypertension    IBS (irritable bowel syndrome)    Irritable bowel syndrome with diarrhea 04/20/2017   LBP (low back pain) 08/05/2015   Melanoma ([place])    Past Surgical History:  Procedure Laterality Date   BREAST BIOPSY Right    neg   BREAST SURGERY     1980's   CAROTID ARTERY ANGIOPLASTY Left 01/18/2015   Dr. Delana Meyer [facility]   CHOLECYSTECTOMY  06/27/2012   DECOMPRESSIVE LUMBAR LAMINECTOMY LEVEL 1  1950   EYE SURGERY Right 2012   cataract extraction   SKIN SURGERY     Social History   Socioeconomic History   Marital status: Widowed    Spouse name: Not on file   Number of children: 2   Years of education: Not on file   Highest education level: 12th grade  Occupational History   Occupation: retired  Tobacco Use   Smoking status: Former    Packs/day: 0.50    Types: Cigarettes    Quit date: 11/15/1976    Years since quitting: 44.5   Smokeless tobacco: Never  Vaping Use    Vaping Use: Never used  Substance and Sexual Activity   Alcohol use: No   Drug use: No   Sexual activity: Not Currently    Birth control/protection: Post-menopausal  Other Topics Concern   Not on file  Social History Narrative   Not on file   Social Determinants of Health   Financial Resource Strain: Not on file  Food Insecurity: Not on file  Transportation Needs: Not on file  Physical Activity: Not on file  Stress: Not on file  Social Connections: Not on file  Intimate Partner Violence: Not on file   Family Status  Relation Name Status   Mother  Deceased at age 17   Brother  64   Brother  Deceased at age 20   Brother  Deceased   Father  Deceased at age 43       in fire   Daughter  Alive   Son  Alive   Neg Hx  (Not Specified)   Family History  Problem Relation Age of Onset   Ovarian cancer Mother    Heart disease Brother    Heart attack Brother    Gout Brother    Alzheimer's disease Brother    Dementia Brother    Heart attack Brother  Diabetes Daughter    Melanoma Daughter    Stroke Son    Breast cancer Neg Hx    Allergies  Allergen Reactions   Cephalosporins     "upset stomach"   Codeine    Macrolides And Ketolides     "upset stomach"   Penicillins    Tramadol Nausea Only   Doxycycline Rash   Sulfa Antibiotics Rash    Patient Care Team: Gwyneth Sprout, FNP as PCP - General (Family Medicine) Eulogio Bear, MD as Consulting Physician (Ophthalmology) Ubaldo Glassing Javier Docker, MD as Consulting Physician (Cardiology) Troxler, Adele Schilder as Attending Physician (Podiatry) Dasher, Rayvon Char, MD (Dermatology)   Medications: Outpatient Medications Prior to Visit  Medication Sig   aspirin EC 81 MG tablet Take 81 mg by mouth daily.    cholecalciferol (VITAMIN D) 1000 UNITS tablet Take 1,000 Units by mouth daily.    erythromycin ophthalmic ointment Place 1 application into both eyes once. Reported on 03/25/2016   glucose blood (ONE TOUCH ULTRA TEST) test  strip Check blood glucose once daily   OMEGA-3 FATTY ACIDS PO Take 1 capsule by mouth daily.    oxybutynin (DITROPAN XL) 10 MG 24 hr tablet Take 1 tablet (10 mg total) by mouth at bedtime.   [DISCONTINUED] amLODipine (NORVASC) 10 MG tablet Take 1 tablet (10 mg total) by mouth daily.   [DISCONTINUED] atorvastatin (LIPITOR) 40 MG tablet Take 1 tablet (40 mg total) by mouth daily at 6 PM.   [DISCONTINUED] clobetasol ointment (TEMOVATE) 0.05 % Apply to affected area every night for 4 weeks, then every other day for 4 weeks and then twice a week for 4 weeks or until resolution.   [DISCONTINUED] glipiZIDE (GLUCOTROL XL) 10 MG 24 hr tablet Take 1 tablet (10 mg total) by mouth every other day.   [DISCONTINUED] hydrochlorothiazide (HYDRODIURIL) 12.5 MG tablet Take 1 tablet (12.5 mg total) by mouth every other day.   [DISCONTINUED] levofloxacin (LEVAQUIN) 250 MG tablet Take 1 tablet (250 mg total) by mouth daily.   [DISCONTINUED] temazepam (RESTORIL) 15 MG capsule Take 1 capsule (15 mg total) by mouth at bedtime as needed for sleep.   [DISCONTINUED] triamcinolone cream (KENALOG) 0.1 % Apply 1 application topically 2 (two) times daily.   [DISCONTINUED] benzonatate (TESSALON) 100 MG capsule Take 1 capsule (100 mg total) by mouth 3 (three) times daily as needed for cough. (Patient not taking: Reported on 06/10/2021)   [DISCONTINUED] polyethylene glycol (MIRALAX / GLYCOLAX) packet Take 17 g by mouth daily. (Patient not taking: Reported on 06/10/2021)   No facility-administered medications prior to visit.    Review of Systems  Constitutional: Negative.   HENT: Negative.    Eyes: Negative.   Respiratory: Negative.    Cardiovascular: Negative.  Negative for chest pain, palpitations and leg swelling.  Gastrointestinal: Negative.   Endocrine: Negative.   Genitourinary: Negative.   Musculoskeletal:  Positive for arthralgias.  Skin: Negative.   Allergic/Immunologic: Negative.   Neurological: Negative.    Hematological: Negative.   Psychiatric/Behavioral: Negative.    All other systems reviewed and are negative.    Objective    BP 133/71   Pulse (!) 102   Resp 16   Ht '5\' 2"'$  (1.575 m)   Wt 141 lb 1.6 oz (64 kg)   SpO2 100%   BMI 25.81 kg/m    Physical Exam Constitutional:      General: She is not in acute distress.    Appearance: She is not toxic-appearing.  HENT:  Head: Normocephalic.     Right Ear: Tympanic membrane normal.     Left Ear: Tympanic membrane normal.     Nose: Nose normal.     Mouth/Throat:     Mouth: Mucous membranes are moist.  Eyes:     Conjunctiva/sclera: Conjunctivae normal.     Pupils: Pupils are equal, round, and reactive to light.  Cardiovascular:     Rate and Rhythm: Normal rate and regular rhythm.     Heart sounds: Murmur heard.    No friction rub. No gallop.  Pulmonary:     Effort: Pulmonary effort is normal. No respiratory distress.     Breath sounds: Normal breath sounds. No wheezing.  Abdominal:     General: Bowel sounds are normal. There is no distension.     Palpations: Abdomen is soft. There is no mass.     Tenderness: There is no abdominal tenderness. There is no guarding or rebound.     Hernia: No hernia is present.  Genitourinary:    General: Normal vulva.  Musculoskeletal:        General: No swelling. Normal range of motion.     Cervical back: Normal range of motion and neck supple.  Skin:    Capillary Refill: Capillary refill takes 2 to 3 seconds.     Findings: Rash present. Rash is macular.     Comments: Folds of pannus   Neurological:     General: No focal deficit present.     Mental Status: She is alert and oriented to person, place, and time.  Psychiatric:        Mood and Affect: Mood normal.        Behavior: Behavior normal.        Thought Content: Thought content normal.        Judgment: Judgment normal.      Last depression screening scores PHQ 2/9 Scores 05/09/2020 05/08/2019 04/28/2018  PHQ - 2 Score 0 0 0   PHQ- 9 Score - - -   Last fall risk screening Fall Risk  05/09/2020  Falls in the past year? 0  Comment -  Number falls in past yr: 0  Injury with Fall? 0  Follow up -   Last Audit-C alcohol use screening Alcohol Use Disorder Test (AUDIT) 05/09/2020  1. How often do you have a drink containing alcohol? 0  2. How many drinks containing alcohol do you have on a typical day when you are drinking? 0  3. How often do you have six or more drinks on one occasion? 0  AUDIT-C Score 0  Alcohol Brief Interventions/Follow-up AUDIT Score <7 follow-up not indicated   A score of 3 or more in women, and 4 or more in men indicates increased risk for alcohol abuse, EXCEPT if all of the points are from question 1   No results found for any visits on 06/10/21.  Assessment & Plan    Routine Health Maintenance and Physical Exam  Exercise Activities and Dietary recommendations  Goals      DIET - EAT MORE FRUITS AND VEGETABLES     Recommend adding in 2 servings each of fruits and vegetables in daily diet.       Increase water intake     Recommend increasing water intake to 6 glasses a day.         Immunization History  Administered Date(s) Administered   Influenza, High Dose Seasonal PF 09/05/2015   Influenza,inj,Quad PF,6+ Mos 08/14/2013   Influenza-Unspecified 08/31/2017, 08/08/2018  PFIZER(Purple Top)SARS-COV-2 Vaccination 12/26/2019, 01/16/2020   Pneumococcal Conjugate-13 04/02/2014   Pneumococcal Polysaccharide-23 12/08/2004   Zoster, Live 03/03/2010    Health Maintenance  Topic Date Due   Zoster Vaccines- Shingrix (1 of 2) Never done   FOOT EXAM  01/10/2020   COVID-19 Vaccine (3 - Pfizer risk series) 02/13/2020   OPHTHALMOLOGY EXAM  04/06/2020   HEMOGLOBIN A1C  11/12/2020   URINE MICROALBUMIN  05/13/2021   TETANUS/TDAP  11/16/2026 (Originally 01/20/1950)   DEXA SCAN  04/17/2031 (Originally 03/15/2014)   INFLUENZA VACCINE  06/16/2021   PNA vac Low Risk Adult  Completed   HPV  VACCINES  Aged Out    Discussed health benefits of physical activity, and encouraged her to engage in regular exercise appropriate for her age and condition.  Problem List Items Addressed This Visit       Cardiovascular and Mediastinum   Aortic heart valve narrowing    Followed by cards Next appt in November 2022 Denies new signs/symptoms       Relevant Medications   amLODipine (NORVASC) 10 MG tablet   atorvastatin (LIPITOR) 40 MG tablet   hydrochlorothiazide (HYDRODIURIL) 12.5 MG tablet   Essential hypertension    Blood pressure stable Taking medications Continue to monitor       Relevant Medications   amLODipine (NORVASC) 10 MG tablet   atorvastatin (LIPITOR) 40 MG tablet   hydrochlorothiazide (HYDRODIURIL) 12.5 MG tablet   Other Relevant Orders   Magnesium   Urinalysis     Endocrine   Diabetes mellitus without complication (HCC)    123456 to be drawn with lab work Not checking blood glucose levels at home Infrequent meals- often 1/day given poor sleeping schedule Ate at [place] today, no other meals Encouraged balanced diet, and adequate water consumption       Relevant Medications   atorvastatin (LIPITOR) 40 MG tablet   glipiZIDE (GLUCOTROL XL) 10 MG 24 hr tablet   Other Relevant Orders   Urinalysis   Hemoglobin A1c   CBC     Genitourinary   Vaginal dryness    Chronic per pt report No erythema noted on external examination Clobetasol ointment previously prescribed by GYN- reordered Estrogen cream also ordered to address vaginal atrophy and dryness       Relevant Medications   conjugated estrogens (PREMARIN) vaginal cream (Start on 06/11/2021)     Other   HLD (hyperlipidemia)   Relevant Medications   amLODipine (NORVASC) 10 MG tablet   atorvastatin (LIPITOR) 40 MG tablet   hydrochlorothiazide (HYDRODIURIL) 12.5 MG tablet   Other Relevant Orders   Lipid panel   Insomnia   Relevant Medications   traZODone (DESYREL) 50 MG tablet   temazepam  (RESTORIL) 15 MG capsule   Encounter for Medicare annual wellness exam - Primary    No acute changes Preventative care       Relevant Orders   CBC   Candida rash of groin    Ongoing problem, has improved with steroid cream Recommended keeping area dry Additional antifungals added in addition to steroid       Relevant Medications   nystatin (MYCOSTATIN/NYSTOP) powder   nystatin-triamcinolone ointment (MYCOLOG)   Murmur, cardiac    Known aortic valve problems, being followed by cards Denies palpitations, CP or SOB No edema noted All pulses intact         Return in about 6 months (around 12/11/2021) for chonic disease management.     Vonna Kotyk, FNP, have reviewed all documentation for  this visit. The documentation on 06/10/21 for the exam, diagnosis, procedures, and orders are all accurate and complete.  Gwyneth Sprout, [place] [phone] (phone) [phone] (fax)  [place]

## 2021-06-10 NOTE — Assessment & Plan Note (Signed)
No acute changes Preventative care

## 2021-06-10 NOTE — Assessment & Plan Note (Signed)
Chronic per pt report No erythema noted on external examination Clobetasol ointment previously prescribed by GYN- reordered Estrogen cream also ordered to address vaginal atrophy and dryness

## 2021-06-10 NOTE — Assessment & Plan Note (Signed)
A1c to be drawn with lab work Not checking blood glucose levels at home Infrequent meals- often 1/day given poor sleeping schedule Ate at [place] today, no other meals Encouraged balanced diet, and adequate water consumption

## 2021-06-10 NOTE — Assessment & Plan Note (Signed)
Known aortic valve problems, being followed by cards Denies palpitations, CP or SOB No edema noted All pulses intact

## 2021-06-10 NOTE — Assessment & Plan Note (Signed)
Ongoing problem, has improved with steroid cream Recommended keeping area dry Additional antifungals added in addition to steroid

## 2021-06-10 NOTE — Assessment & Plan Note (Signed)
Blood pressure stable Taking medications Continue to monitor

## 2021-06-10 NOTE — Assessment & Plan Note (Signed)
Followed by cards Next appt in November 2022 Denies new signs/symptoms

## 2021-06-13 DIAGNOSIS — E119 Type 2 diabetes mellitus without complications: Secondary | ICD-10-CM | POA: Diagnosis not present

## 2021-06-13 DIAGNOSIS — Z0000 Encounter for general adult medical examination without abnormal findings: Secondary | ICD-10-CM | POA: Diagnosis not present

## 2021-06-13 DIAGNOSIS — I10 Essential (primary) hypertension: Secondary | ICD-10-CM | POA: Diagnosis not present

## 2021-06-13 DIAGNOSIS — E7800 Pure hypercholesterolemia, unspecified: Secondary | ICD-10-CM | POA: Diagnosis not present

## 2021-06-14 LAB — COMPREHENSIVE METABOLIC PANEL
ALT: 31 IU/L (ref 0–32)
AST: 28 IU/L (ref 0–40)
Albumin/Globulin Ratio: 2 (ref 1.2–2.2)
Albumin: 4.7 g/dL — ABNORMAL HIGH (ref 3.5–4.6)
Alkaline Phosphatase: 67 IU/L (ref 44–121)
BUN/Creatinine Ratio: 15 (ref 12–28)
BUN: 12 mg/dL (ref 10–36)
Bilirubin Total: 0.7 mg/dL (ref 0.0–1.2)
CO2: 22 mmol/L (ref 20–29)
Calcium: 10.5 mg/dL — ABNORMAL HIGH (ref 8.7–10.3)
Chloride: 99 mmol/L (ref 96–106)
Creatinine, Ser: 0.78 mg/dL (ref 0.57–1.00)
Globulin, Total: 2.3 g/dL (ref 1.5–4.5)
Glucose: 173 mg/dL — ABNORMAL HIGH (ref 65–99)
Potassium: 4.2 mmol/L (ref 3.5–5.2)
Sodium: 141 mmol/L (ref 134–144)
Total Protein: 7 g/dL (ref 6.0–8.5)
eGFR: 72 mL/min/{1.73_m2} (ref >59)

## 2021-06-14 LAB — LIPID PANEL
Chol/HDL Ratio: 5 ratio — ABNORMAL HIGH (ref 0.0–4.4)
Cholesterol, Total: 238 mg/dL — ABNORMAL HIGH (ref 100–199)
HDL: 48 mg/dL (ref >39)
LDL Chol Calc (NIH): 138 mg/dL — ABNORMAL HIGH (ref 0–99)
Triglycerides: 287 mg/dL — ABNORMAL HIGH (ref 0–149)
VLDL Cholesterol Cal: 52 mg/dL — ABNORMAL HIGH (ref 5–40)

## 2021-06-14 LAB — CBC
Hematocrit: 46.7 % — ABNORMAL HIGH (ref 34.0–46.6)
Hemoglobin: 15.5 g/dL (ref 11.1–15.9)
MCH: 29.8 pg (ref 26.6–33.0)
MCHC: 33.2 g/dL (ref 31.5–35.7)
MCV: 90 fL (ref 79–97)
Platelets: 171 10*3/uL (ref 150–450)
RBC: 5.21 x10E6/uL (ref 3.77–5.28)
RDW: 12.7 % (ref 11.7–15.4)
WBC: 8 10*3/uL (ref 3.4–10.8)

## 2021-06-14 LAB — MAGNESIUM: Magnesium: 2.1 mg/dL (ref 1.6–2.3)

## 2021-06-14 LAB — HEMOGLOBIN A1C
Est. average glucose Bld gHb Est-mCnc: 169 mg/dL
Hgb A1c MFr Bld: 7.5 % — ABNORMAL HIGH (ref 4.8–5.6)

## 2021-06-14 LAB — URINALYSIS
Bilirubin, UA: NEGATIVE
Glucose, UA: NEGATIVE
Ketones, UA: NEGATIVE
Leukocytes,UA: NEGATIVE
Nitrite, UA: NEGATIVE
RBC, UA: NEGATIVE
Specific Gravity, UA: 1.018 (ref 1.005–1.030)
Urobilinogen, Ur: 0.2 mg/dL (ref 0.2–1.0)
pH, UA: 7 (ref 5.0–7.5)

## 2021-06-14 LAB — TSH: TSH: 2.46 u[IU]/mL (ref 0.450–4.500)

## 2021-06-19 ENCOUNTER — Encounter: Payer: Self-pay | Admitting: *Deleted

## 2021-09-15 DIAGNOSIS — Z23 Encounter for immunization: Secondary | ICD-10-CM | POA: Diagnosis not present

## 2021-09-30 ENCOUNTER — Encounter: Payer: Self-pay | Admitting: Family Medicine

## 2021-09-30 ENCOUNTER — Ambulatory Visit (INDEPENDENT_AMBULATORY_CARE_PROVIDER_SITE_OTHER): Payer: Medicare Other | Admitting: Family Medicine

## 2021-09-30 ENCOUNTER — Other Ambulatory Visit: Payer: Self-pay

## 2021-09-30 VITALS — BP 153/70 | HR 62 | Resp 16 | Wt 137.4 lb

## 2021-09-30 DIAGNOSIS — I10 Essential (primary) hypertension: Secondary | ICD-10-CM | POA: Diagnosis not present

## 2021-09-30 DIAGNOSIS — M545 Low back pain, unspecified: Secondary | ICD-10-CM

## 2021-09-30 DIAGNOSIS — K5904 Chronic idiopathic constipation: Secondary | ICD-10-CM | POA: Insufficient documentation

## 2021-09-30 DIAGNOSIS — G8929 Other chronic pain: Secondary | ICD-10-CM | POA: Diagnosis not present

## 2021-09-30 MED ORDER — LACTULOSE 10 GM/15ML PO SOLN: 20.0000 g | Freq: Two times a day (BID) | ORAL | 0 refills | Status: DC | PRN

## 2021-09-30 MED ORDER — CYCLOBENZAPRINE HCL 5 MG PO TABS: 5.0000 mg | ORAL_TABLET | Freq: Three times a day (TID) | ORAL | 1 refills | Status: DC | PRN

## 2021-09-30 NOTE — Progress Notes (Signed)
Established patient visit   Patient: [name]   DOB: 1929/04/12   87 y.o. Female  MRN: [id number] Visit Date: 09/30/2021  Today's healthcare provider: Gwyneth Sprout, FNP   Chief Complaint  Patient presents with   Back Pain   Subjective    HPI HPI     Back Pain   This is a new problem.  There was not an injury that may have caused the pain.  Recent episode started 1 to 4 weeks ago.  The problem has been unchanged since onset.  Pain is lumbar spine.  The quality of pain is described as soreness.  Pain occurs constantly.  Treatments: acetaminophen and NSAIDs.  Treatment provided mild relief.  Abdominal Pain: Present.  Bowel incontinence: Absent.  Chest pain: Absent.  Dysuria: Absent.  Fever: Absent.  Headaches: Absent.  Joint pains: Absent.  Weakness in leg: Absent.  Pelvic pain: Absent.  Tingling in lower extremities: Absent.  Urinary incontinence: Absent.  Weight loss: Absent.      Last edited by Minette Headland, CMA on 09/30/2021  9:16 AM.          Medications: Outpatient Medications Prior to Visit  Medication Sig   amLODipine (NORVASC) 10 MG tablet Take 1 tablet (10 mg total) by mouth daily.   aspirin EC 81 MG tablet Take 81 mg by mouth daily.    atorvastatin (LIPITOR) 40 MG tablet Take 1 tablet (40 mg total) by mouth daily at 6 PM.   cholecalciferol (VITAMIN D) 1000 UNITS tablet Take 1,000 Units by mouth daily.    clobetasol ointment (TEMOVATE) 0.05 % Apply to affected area every night for 4 weeks, then every other day for 4 weeks and then twice a week for 4 weeks or until resolution.   conjugated estrogens (PREMARIN) vaginal cream Place 1 Applicatorful vaginally 3 (three) times a week.   erythromycin ophthalmic ointment Place 1 application into both eyes once. Reported on 03/25/2016   glipiZIDE (GLUCOTROL XL) 10 MG 24 hr tablet Take 1 tablet (10 mg total) by mouth every other day.   glucose blood (ONE TOUCH ULTRA TEST) test strip Check blood glucose once  daily   hydrochlorothiazide (HYDRODIURIL) 12.5 MG tablet Take 1 tablet (12.5 mg total) by mouth every other day.   nystatin (MYCOSTATIN/NYSTOP) powder Apply 1 application topically 3 (three) times daily.   nystatin-triamcinolone ointment (MYCOLOG) Apply 1 application topically 2 (two) times daily.   OMEGA-3 FATTY ACIDS PO Take 1 capsule by mouth daily.    oxybutynin (DITROPAN XL) 10 MG 24 hr tablet Take 1 tablet (10 mg total) by mouth at bedtime.   temazepam (RESTORIL) 15 MG capsule Take 1 capsule (15 mg total) by mouth at bedtime as needed for sleep.   traZODone (DESYREL) 50 MG tablet Take 0.5-1 tablets (25-50 mg total) by mouth at bedtime as needed for sleep.   No facility-administered medications prior to visit.    Review of Systems     Objective    BP (!) 153/70   Pulse 62   Resp 16   Wt 137 lb 6.4 oz (62.3 kg)   SpO2 98%   BMI 25.13 kg/m    Physical Exam Vitals and nursing note reviewed.  Constitutional:      General: She is not in acute distress.    Appearance: Normal appearance. She is normal weight. She is not ill-appearing, toxic-appearing or diaphoretic.  HENT:     Head: Normocephalic and atraumatic.  Cardiovascular:  Rate and Rhythm: Normal rate and regular rhythm.     Pulses: Normal pulses.     Heart sounds: Normal heart sounds. No murmur heard.   No friction rub. No gallop.  Pulmonary:     Effort: Pulmonary effort is normal. No respiratory distress.     Breath sounds: Normal breath sounds. No stridor. No wheezing, rhonchi or rales.  Chest:     Chest wall: No tenderness.  Abdominal:     General: Abdomen is flat. Bowel sounds are normal.     Palpations: Abdomen is soft.     Tenderness: There is no abdominal tenderness. There is no right CVA tenderness, left CVA tenderness, guarding or rebound.     Hernia: No hernia is present.       Comments: Bandlike pressure around abdomen and back  Musculoskeletal:        General: No swelling, tenderness, deformity  or signs of injury. Normal range of motion.       Arms:     Right lower leg: No edema.     Left lower leg: No edema.     Comments: Band like pressure around back and abdomen   Skin:    General: Skin is warm and dry.     Capillary Refill: Capillary refill takes less than 2 seconds.     Coloration: Skin is not jaundiced or pale.     Findings: No bruising, erythema, lesion or rash.  Neurological:     General: No focal deficit present.     Mental Status: She is alert and oriented to person, place, and time. Mental status is at baseline.     Cranial Nerves: No cranial nerve deficit.     Sensory: No sensory deficit.     Motor: No weakness.     Coordination: Coordination normal.  Psychiatric:        Mood and Affect: Mood normal.        Behavior: Behavior normal.        Thought Content: Thought content normal.        Judgment: Judgment normal.     No results found for any visits on 09/30/21.  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     Vonna Kotyk, FNP, have reviewed all documentation for this visit. The documentation on 09/30/21 for the exam,  diagnosis, procedures, and orders are all accurate and complete.    Gwyneth Sprout, [place] [phone] (phone) [phone] (fax)  [place]

## 2021-09-30 NOTE — Assessment & Plan Note (Signed)
Reports of 1 small BM s/p cup of prune juice followed by pellets for the remainder of the day Has been taking miralax since last CVA Chronic incomplete evacuation/straining

## 2021-09-30 NOTE — Assessment & Plan Note (Signed)
Band like pressure -CVA tenderness -no exacerbating feature  -no mechanism of injury

## 2021-09-30 NOTE — Assessment & Plan Note (Signed)
Elevated Did not take meds this morning as did not eat Continue to monitor as previously controlled given acute pain

## 2021-10-07 ENCOUNTER — Other Ambulatory Visit: Payer: Self-pay | Admitting: Family Medicine

## 2021-10-07 ENCOUNTER — Telehealth: Payer: Self-pay

## 2021-10-07 DIAGNOSIS — G8929 Other chronic pain: Secondary | ICD-10-CM

## 2021-10-07 DIAGNOSIS — M545 Low back pain, unspecified: Secondary | ICD-10-CM

## 2021-10-07 NOTE — Telephone Encounter (Signed)
Please review request. KW 

## 2021-10-07 NOTE — Telephone Encounter (Signed)
Patient stopped by office to let you know her back pain is no better at all. She is requesting a back xray. Please let patient know if you will order this.

## 2021-10-07 NOTE — Telephone Encounter (Signed)
Attempted to reach patient with no response. Orders have been placed for xray patient can et that done at outpatient center directly across from our office walk in. [place]

## 2021-10-16 ENCOUNTER — Other Ambulatory Visit: Payer: Self-pay

## 2021-10-16 ENCOUNTER — Ambulatory Visit (INDEPENDENT_AMBULATORY_CARE_PROVIDER_SITE_OTHER): Payer: Medicare Other | Admitting: Family Medicine

## 2021-10-16 ENCOUNTER — Ambulatory Visit
Admission: RE | Admit: 2021-10-16 | Discharge: 2021-10-16 | Disposition: A | Discharge status: Home / Self Care | Payer: Medicare Other | Attending: Family Medicine | Admitting: Family Medicine

## 2021-10-16 ENCOUNTER — Ambulatory Visit
Admission: RE | Admit: 2021-10-16 | Discharge: 2021-10-16 | Disposition: A | Discharge status: Home / Self Care | Payer: Medicare Other | Source: Ambulatory Visit | Attending: Family Medicine | Admitting: Family Medicine

## 2021-10-16 ENCOUNTER — Encounter: Payer: Self-pay | Admitting: Family Medicine

## 2021-10-16 VITALS — BP 133/78 | HR 90 | Temp 97.9°F | Resp 18 | Wt 139.0 lb

## 2021-10-16 DIAGNOSIS — G8929 Other chronic pain: Secondary | ICD-10-CM

## 2021-10-16 DIAGNOSIS — K5904 Chronic idiopathic constipation: Secondary | ICD-10-CM | POA: Diagnosis not present

## 2021-10-16 DIAGNOSIS — M810 Age-related osteoporosis without current pathological fracture: Secondary | ICD-10-CM | POA: Diagnosis not present

## 2021-10-16 DIAGNOSIS — H0100B Unspecified blepharitis left eye, upper and lower eyelids: Secondary | ICD-10-CM | POA: Diagnosis not present

## 2021-10-16 DIAGNOSIS — M545 Low back pain, unspecified: Secondary | ICD-10-CM | POA: Insufficient documentation

## 2021-10-16 DIAGNOSIS — H0100A Unspecified blepharitis right eye, upper and lower eyelids: Secondary | ICD-10-CM | POA: Diagnosis not present

## 2021-10-16 NOTE — Progress Notes (Signed)
Established patient visit   Patient: [name]   DOB: 1929-07-18   87 y.o. Female  MRN: [id number] Visit Date: 10/16/2021  Today's healthcare provider: Gwyneth Sprout, FNP   Chief Complaint  Patient presents with   Back Pain   Subjective    Back Pain This is a chronic problem. The current episode started more than 1 month ago. The problem has been waxing and waning since onset. The pain is present in the lumbar spine. The quality of the pain is described as aching. Associated symptoms include abdominal pain. Pertinent negatives include no chest pain, fever or weakness. She has tried muscle relaxant for the symptoms.   Patient was seen in the office for back pain on 09/30/2021. Patient was given a prescription for Flexeril. Patient call back on 10/07/2021 reporting that her back pain was no better. Imaging orders for x ray was placed, but patient did not return our call to have x ray done.   Medications: Outpatient Medications Prior to Visit  Medication Sig   amLODipine (NORVASC) 10 MG tablet Take 1 tablet (10 mg total) by mouth daily.   aspirin EC 81 MG tablet Take 81 mg by mouth daily.    atorvastatin (LIPITOR) 40 MG tablet Take 1 tablet (40 mg total) by mouth daily at 6 PM.   cholecalciferol (VITAMIN D) 1000 UNITS tablet Take 1,000 Units by mouth daily.    clobetasol ointment (TEMOVATE) 0.05 % Apply to affected area every night for 4 weeks, then every other day for 4 weeks and then twice a week for 4 weeks or until resolution.   conjugated estrogens (PREMARIN) vaginal cream Place 1 Applicatorful vaginally 3 (three) times a week.   erythromycin ophthalmic ointment Place 1 application into both eyes once. Reported on 03/25/2016   glipiZIDE (GLUCOTROL XL) 10 MG 24 hr tablet Take 1 tablet (10 mg total) by mouth every other day.   glucose blood (ONE TOUCH ULTRA TEST) test strip Check blood glucose once daily   hydrochlorothiazide (HYDRODIURIL) 12.5 MG tablet Take 1 tablet (12.5 mg  total) by mouth every other day.   lactulose (CHRONULAC) 10 GM/15ML solution Take 30 mLs (20 g total) by mouth 2 (two) times daily as needed for mild constipation.   nystatin (MYCOSTATIN/NYSTOP) powder Apply 1 application topically 3 (three) times daily.   nystatin-triamcinolone ointment (MYCOLOG) Apply 1 application topically 2 (two) times daily.   OMEGA-3 FATTY ACIDS PO Take 1 capsule by mouth daily.    oxybutynin (DITROPAN XL) 10 MG 24 hr tablet Take 1 tablet (10 mg total) by mouth at bedtime.   temazepam (RESTORIL) 15 MG capsule Take 1 capsule (15 mg total) by mouth at bedtime as needed for sleep.   traZODone (DESYREL) 50 MG tablet Take 0.5-1 tablets (25-50 mg total) by mouth at bedtime as needed for sleep.   cyclobenzaprine (FLEXERIL) 5 MG tablet Take 1 tablet (5 mg total) by mouth 3 (three) times daily as needed for muscle spasms. (Patient not taking: Reported on 10/16/2021)   No facility-administered medications prior to visit.    Review of Systems  Constitutional:  Negative for appetite change, chills, fatigue and fever.  Respiratory:  Negative for chest tightness and shortness of breath.   Cardiovascular:  Negative for chest pain and palpitations.  Gastrointestinal:  Positive for abdominal pain. Negative for nausea and vomiting.  Musculoskeletal:  Positive for back pain.  Neurological:  Negative for dizziness and weakness.      Objective  BP 133/78 (BP Location: Left Arm, Patient Position: Sitting, Cuff Size: Normal)   Pulse 90   Temp 97.9 F (36.6 C) (Oral)   Resp 18   Wt 139 lb (63 kg)   SpO2 97% Comment: room air  BMI 25.42 kg/m    Physical Exam Vitals and nursing note reviewed.  Constitutional:      General: She is not in acute distress.    Appearance: Normal appearance. She is normal weight. She is not ill-appearing, toxic-appearing or diaphoretic.  HENT:     Head: Normocephalic and atraumatic.  Cardiovascular:     Rate and Rhythm: Normal rate and regular  rhythm.     Pulses: Normal pulses.     Heart sounds: Normal heart sounds. No murmur heard.   No friction rub. No gallop.  Pulmonary:     Effort: Pulmonary effort is normal. No respiratory distress.     Breath sounds: Normal breath sounds. No stridor. No wheezing, rhonchi or rales.  Chest:     Chest wall: No tenderness.  Abdominal:     General: Bowel sounds are normal.     Palpations: Abdomen is soft.     Comments: Nondescript lower GI complaints, band like across  Skin soft, no mass Denies recent constipation Has tried lactulose, and maintains daily BM with use of prune juice  Musculoskeletal:        General: Tenderness present. No swelling, deformity or signs of injury. Normal range of motion.       Arms:     Right lower leg: No edema.     Left lower leg: No edema.  Skin:    General: Skin is warm and dry.     Capillary Refill: Capillary refill takes less than 2 seconds.     Coloration: Skin is not jaundiced or pale.     Findings: No bruising, erythema, lesion or rash.  Neurological:     General: No focal deficit present.     Mental Status: She is alert and oriented to person, place, and time. Mental status is at baseline.     Cranial Nerves: No cranial nerve deficit.     Sensory: No sensory deficit.     Motor: No weakness.     Coordination: Coordination normal.  Psychiatric:        Mood and Affect: Mood normal.        Behavior: Behavior normal.        Thought Content: Thought content normal.        Judgment: Judgment normal.      No results found for any visits on 10/16/21.  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      Vonna Kotyk, FNP, have reviewed all documentation for this visit. The documentation on 10/16/21 for the exam, diagnosis, procedures, and orders are all accurate and complete.    Gwyneth Sprout, [place] [phone] (phone) [phone] (fax)  [place]

## 2021-10-16 NOTE — Assessment & Plan Note (Signed)
Known OP Takes APAP and Ibuprofen PRN for pain

## 2021-10-16 NOTE — Assessment & Plan Note (Signed)
Chronic Stable Resolved since last visit with use of PRN lactulose Daily BM maintained since with prune juice

## 2021-10-16 NOTE — Assessment & Plan Note (Signed)
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

## 2021-10-17 DIAGNOSIS — M545 Low back pain, unspecified: Secondary | ICD-10-CM | POA: Diagnosis not present

## 2021-10-20 NOTE — Progress Notes (Signed)
It was a pleasure to see you in the office the other day.  Normal xray, slight disc slipping 2 mm. However, everything stable for age. Recommend posture improvement and change in lifting patterns and increasing core strength.  Will refer to PT if pt is agreeable.

## 2021-11-19 DIAGNOSIS — H2512 Age-related nuclear cataract, left eye: Secondary | ICD-10-CM | POA: Diagnosis not present

## 2021-11-26 DIAGNOSIS — D225 Melanocytic nevi of trunk: Secondary | ICD-10-CM | POA: Diagnosis not present

## 2021-11-26 DIAGNOSIS — Z85828 Personal history of other malignant neoplasm of skin: Secondary | ICD-10-CM | POA: Diagnosis not present

## 2021-11-26 DIAGNOSIS — D0471 Carcinoma in situ of skin of right lower limb, including hip: Secondary | ICD-10-CM | POA: Diagnosis not present

## 2021-11-26 DIAGNOSIS — D044 Carcinoma in situ of skin of scalp and neck: Secondary | ICD-10-CM | POA: Diagnosis not present

## 2021-11-26 DIAGNOSIS — D2262 Melanocytic nevi of left upper limb, including shoulder: Secondary | ICD-10-CM | POA: Diagnosis not present

## 2021-11-26 DIAGNOSIS — D2261 Melanocytic nevi of right upper limb, including shoulder: Secondary | ICD-10-CM | POA: Diagnosis not present

## 2021-11-26 DIAGNOSIS — L439 Lichen planus, unspecified: Secondary | ICD-10-CM | POA: Diagnosis not present

## 2022-01-19 DIAGNOSIS — D0471 Carcinoma in situ of skin of right lower limb, including hip: Secondary | ICD-10-CM | POA: Diagnosis not present

## 2022-01-19 DIAGNOSIS — D044 Carcinoma in situ of skin of scalp and neck: Secondary | ICD-10-CM | POA: Diagnosis not present

## 2022-02-02 ENCOUNTER — Other Ambulatory Visit: Payer: Self-pay | Admitting: Family Medicine

## 2022-02-02 DIAGNOSIS — F5101 Primary insomnia: Secondary | ICD-10-CM

## 2022-06-15 DIAGNOSIS — C44622 Squamous cell carcinoma of skin of right upper limb, including shoulder: Secondary | ICD-10-CM | POA: Diagnosis not present

## 2022-06-15 DIAGNOSIS — D2261 Melanocytic nevi of right upper limb, including shoulder: Secondary | ICD-10-CM | POA: Diagnosis not present

## 2022-06-15 DIAGNOSIS — D225 Melanocytic nevi of trunk: Secondary | ICD-10-CM | POA: Diagnosis not present

## 2022-06-15 DIAGNOSIS — D2271 Melanocytic nevi of right lower limb, including hip: Secondary | ICD-10-CM | POA: Diagnosis not present

## 2022-06-15 DIAGNOSIS — D2262 Melanocytic nevi of left upper limb, including shoulder: Secondary | ICD-10-CM | POA: Diagnosis not present

## 2022-06-15 DIAGNOSIS — D0439 Carcinoma in situ of skin of other parts of face: Secondary | ICD-10-CM | POA: Diagnosis not present

## 2022-06-15 DIAGNOSIS — C44319 Basal cell carcinoma of skin of other parts of face: Secondary | ICD-10-CM | POA: Diagnosis not present

## 2022-07-02 NOTE — Progress Notes (Signed)
I,[name],acting as a Education administrator for Whitney Sprout, FNP.,have documented all relevant documentation on the behalf of Whitney Sprout, FNP,as directed by  Whitney Sprout, FNP while in the presence of Whitney Sprout, FNP.  Annual Wellness Visit   Patient: [name], Female    DOB: 02/11/1929, 88 y.o.   MRN: [id number] Visit Date: 07/06/2022  Today's Provider: Gwyneth Sprout, FNP  Re Introduced to nurse practitioner role and practice setting.  All questions answered.  Discussed provider/patient relationship and expectations.  I,[name],acting as a scribe for Whitney Sprout, FNP.,have documented all relevant documentation on the behalf of Whitney Sprout, FNP,as directed by  Whitney Sprout, FNP while in the presence of Whitney Sprout, FNP.   Chief Complaint  Patient presents with   Annual Exam   Subjective    [name] is a 88 y.o. female who presents today for her Annual Wellness Visit. She reports consuming a general diet. The patient does not participate in regular exercise at present. She generally feels well. She reports sleeping well. She does not have additional problems to discuss today.   HPI  Medications: Outpatient Medications Prior to Visit  Medication Sig   amLODipine (NORVASC) 10 MG tablet Take 1 tablet (10 mg total) by mouth daily.   aspirin EC 81 MG tablet Take 81 mg by mouth daily.    atorvastatin (LIPITOR) 40 MG tablet Take 1 tablet (40 mg total) by mouth daily at 6 PM.   cholecalciferol (VITAMIN D) 1000 UNITS tablet Take 1,000 Units by mouth daily.    clobetasol ointment (TEMOVATE) 0.05 % Apply to affected area every night for 4 weeks, then every other day for 4 weeks and then twice a week for 4 weeks or until resolution.   conjugated estrogens (PREMARIN) vaginal cream Place 1 Applicatorful vaginally 3 (three) times a week.   cyclobenzaprine (FLEXERIL) 5 MG tablet Take 1 tablet (5 mg total) by mouth 3 (three) times daily as needed for muscle spasms.    erythromycin ophthalmic ointment Place 1 application into both eyes once. Reported on 03/25/2016   glipiZIDE (GLUCOTROL XL) 10 MG 24 hr tablet Take 1 tablet (10 mg total) by mouth every other day.   glucose blood (ONE TOUCH ULTRA TEST) test strip Check blood glucose once daily   hydrochlorothiazide (HYDRODIURIL) 12.5 MG tablet Take 1 tablet (12.5 mg total) by mouth every other day.   lactulose (CHRONULAC) 10 GM/15ML solution Take 30 mLs (20 g total) by mouth 2 (two) times daily as needed for mild constipation.   nystatin (MYCOSTATIN/NYSTOP) powder Apply 1 application topically 3 (three) times daily.   nystatin-triamcinolone ointment (MYCOLOG) Apply 1 application topically 2 (two) times daily.   OMEGA-3 FATTY ACIDS PO Take 1 capsule by mouth daily.    oxybutynin (DITROPAN XL) 10 MG 24 hr tablet Take 1 tablet (10 mg total) by mouth at bedtime.   temazepam (RESTORIL) 15 MG capsule TAKE 1 CAPSULE BY MOUTH AT BEDTIME AS NEEDED FOR SLEEP   traZODone (DESYREL) 50 MG tablet Take 0.5-1 tablets (25-50 mg total) by mouth at bedtime as needed for sleep.   No facility-administered medications prior to visit.    Allergies  Allergen Reactions   Cephalosporins     "upset stomach"   Codeine    Macrolides And Ketolides     "upset stomach" Other reaction(s): Unknown "upset stomach"   Nitrofurantoin     Other reaction(s): Other (See Comments)  Penicillins    Tramadol Nausea Only   Doxycycline Rash   Sulfa Antibiotics Rash    Patient Care Team: Whitney Sprout, FNP as PCP - General (Family Medicine) Whitney Bear, Schneider as Consulting Physician (Ophthalmology) Whitney Glassing Javier Docker, Schneider as Consulting Physician (Cardiology) Troxler, [name] (Inactive) as Attending Physician (Podiatry) [name] (Dermatology)  Review of Systems  Eyes:  Positive for photophobia and discharge.  Genitourinary:  Positive for frequency.  All other systems reviewed and are negative.   Last CBC Lab Results   Component Value Date   WBC 8.0 06/13/2021   HGB 15.5 06/13/2021   HCT 46.7 (H) 06/13/2021   MCV 90 06/13/2021   MCH 29.8 06/13/2021   RDW 12.7 06/13/2021   PLT 171 40/98/1191   Last metabolic panel Lab Results  Component Value Date   GLUCOSE 173 (H) 06/13/2021   NA 141 06/13/2021   K 4.2 06/13/2021   CL 99 06/13/2021   CO2 22 06/13/2021   BUN 12 06/13/2021   CREATININE 0.78 06/13/2021   EGFR 72 06/13/2021   CALCIUM 10.5 (H) 06/13/2021   PROT 7.0 06/13/2021   ALBUMIN 4.7 (H) 06/13/2021   LABGLOB 2.3 06/13/2021   AGRATIO 2.0 06/13/2021   BILITOT 0.7 06/13/2021   ALKPHOS 67 06/13/2021   AST 28 06/13/2021   ALT 31 06/13/2021   ANIONGAP 8 12/05/2016   Last lipids Lab Results  Component Value Date   CHOL 238 (H) 06/13/2021   HDL 48 06/13/2021   LDLCALC 138 (H) 06/13/2021   TRIG 287 (H) 06/13/2021   CHOLHDL 5.0 (H) 06/13/2021   Last hemoglobin A1c Lab Results  Component Value Date   HGBA1C 7.5 (H) 06/13/2021   Last thyroid functions Lab Results  Component Value Date   TSH 2.460 06/13/2021   T4TOTAL 6.4 05/14/2020        Objective    Vitals: BP 138/76 (BP Location: Right Arm, Patient Position: Sitting, Cuff Size: Normal)   Pulse 70   Temp 97.9 F (36.6 C) (Oral)   Resp 16   Ht 5' 2"  (1.575 m)   Wt 137 lb (62.1 kg)   SpO2 98%   BMI 25.06 kg/m   BP Readings from Last 3 Encounters:  07/06/22 138/76  10/16/21 133/78  09/30/21 (!) 153/70   Wt Readings from Last 3 Encounters:  07/06/22 137 lb (62.1 kg)  10/16/21 139 lb (63 kg)  09/30/21 137 lb 6.4 oz (62.3 kg)   Physical Exam Vitals and nursing note reviewed.  Constitutional:      General: She is not in acute distress.    Appearance: Normal appearance. She is normal weight. She is not ill-appearing, toxic-appearing or diaphoretic.  HENT:     Head: Normocephalic and atraumatic.  Cardiovascular:     Rate and Rhythm: Normal rate and regular rhythm.     Pulses: Normal pulses.     Heart sounds:  Murmur heard.     No friction rub. No gallop.  Pulmonary:     Effort: Pulmonary effort is normal. No respiratory distress.     Breath sounds: Normal breath sounds. No stridor. No wheezing, rhonchi or rales.  Chest:     Chest wall: No tenderness.  Abdominal:     General: Bowel sounds are normal.     Palpations: Abdomen is soft.  Musculoskeletal:        General: No swelling, tenderness, deformity or signs of injury. Normal range of motion.     Cervical back:  Normal range of motion.     Right lower leg: No edema.     Left lower leg: No edema.  Skin:    General: Skin is warm and dry.     Capillary Refill: Capillary refill takes less than 2 seconds.     Coloration: Skin is not jaundiced or pale.     Findings: No bruising, erythema, lesion or rash.  Neurological:     General: No focal deficit present.     Mental Status: She is alert and oriented to person, place, and time. Mental status is at baseline.     Cranial Nerves: No cranial nerve deficit.     Sensory: No sensory deficit.     Motor: No weakness.     Coordination: Coordination normal.  Psychiatric:        Mood and Affect: Mood normal.        Behavior: Behavior normal.        Thought Content: Thought content normal.        Judgment: Judgment normal.    Most recent functional status assessment:    09/30/2021    9:22 AM  In your present state of health, do you have any difficulty performing the following activities:  Hearing? 0  Vision? 0  Difficulty concentrating or making decisions? 0  Walking or climbing stairs? 0  Dressing or bathing? 0  Doing errands, shopping? 0   Most recent fall risk assessment:    09/30/2021    9:18 AM  Fall Risk   Falls in the past year? 0  Number falls in past yr: 0  Injury with Fall? 0    Most recent depression screenings:    09/30/2021    9:18 AM 05/09/2020    2:17 PM  PHQ 2/9 Scores  PHQ - 2 Score 0 0  PHQ- 9 Score 1    Most recent cognitive screening:    05/09/2020    2:26  PM  6CIT Screen  What Year? 0 points  What month? 0 points  What time? 0 points  Count back from 20 0 points  Months in reverse 0 points  Repeat phrase 0 points  Total Score 0 points   Most recent Audit-C alcohol use screening    05/09/2020    2:19 PM  Alcohol Use Disorder Test (AUDIT)  1. How often do you have a drink containing alcohol? 0  2. How many drinks containing alcohol do you have on a typical day when you are drinking? 0  3. How often do you have six or more drinks on one occasion? 0  AUDIT-C Score 0  Alcohol Brief Interventions/Follow-up AUDIT Score <7 follow-up not indicated   A score of 3 or more in women, and 4 or more in men indicates increased risk for alcohol abuse, EXCEPT if all of the points are from question 1   No results found for any visits on 07/06/22.  Assessment & Plan     Annual wellness visit done today including the all of the following: Reviewed patient's Family Medical History Reviewed and updated list of patient's medical providers Assessment of cognitive impairment was done Assessed patient's functional ability Established a written schedule for health screening [place] Completed and Reviewed  Exercise Activities and Dietary recommendations  Goals      DIET - EAT MORE FRUITS AND VEGETABLES     Recommend adding in 2 servings each of fruits and vegetables in daily diet.      Increase water  intake     Recommend increasing water intake to 6 glasses a day.        Immunization History  Administered Date(s) Administered   Influenza, High Dose Seasonal PF 09/05/2015   Influenza,inj,Quad PF,6+ Mos 08/14/2013   Influenza-Unspecified 08/31/2017, 08/08/2018   PFIZER(Purple Top)SARS-COV-2 Vaccination 12/26/2019, 01/16/2020   Pneumococcal Conjugate-13 04/02/2014   Pneumococcal Polysaccharide-23 12/08/2004   Zoster, Live 03/03/2010    Health Maintenance  Topic Date Due   Zoster Vaccines- Shingrix (1 of 2) Never done    FOOT EXAM  01/10/2020   COVID-19 Vaccine (3 - Pfizer risk series) 02/13/2020   OPHTHALMOLOGY EXAM  04/06/2020   URINE MICROALBUMIN  05/13/2021   HEMOGLOBIN A1C  12/14/2021   INFLUENZA VACCINE  06/16/2022   TETANUS/TDAP  11/16/2026 (Originally 01/20/1948)   DEXA SCAN  04/17/2031 (Originally 03/15/2014)   Pneumonia Vaccine 42+ Years old  Completed   HPV VACCINES  Aged Out   Discussed health benefits of physical activity, and encouraged her to engage in regular exercise appropriate for her age and condition.    Problem List Items Addressed This Visit       Cardiovascular and Mediastinum   Essential hypertension    Chronic, borderline Goal of <130/<80 with associated dx of DM Currently on  -norvasc 10 mg -asa 81 mg -hctz 12.5 mg       Relevant Orders   Comprehensive metabolic panel     Endocrine   Diabetes mellitus without complication (HCC)    Chronic, previous above goal with A1c of 7.5% Continue to recommend balanced, lower carb meals. Smaller meal size, adding snacks. Choosing water as drink of choice and increasing purposeful exercise. Repeat A1c; report UTD on vision and dental Refused foot exam; denies complaints  Previously on glipizide 10 mg every other day      Relevant Orders   Hemoglobin A1c     Other   Allergic conjunctivitis of both eyes    Chronic, variable  Has upcoming appt with Dr Edison Pace Reports acute on chronic clear discharge from bilateral eyes Recommend trial of pataday to assist      Relevant Medications   Olopatadine HCl 0.2 % SOLN   Annual physical exam    Things to do to keep yourself healthy  - Exercise at least 30-45 minutes a day, 3-4 days a week.  - Eat a low-fat diet with lots of fruits and vegetables, up to 7-9 servings per day.  - Seatbelts can save your life. Wear them always.  - Smoke detectors on every level of your home, check batteries every year.  - Eye Doctor - have an eye exam every 1-2 years  - Safe sex - if you may be  exposed to STDs, use a condom.  - Alcohol -  If you drink, do it moderately, less than 2 drinks per day.  - [place]. Choose someone to speak for you if you are not able.  - Depression is common in our stressful world.If you're feeling down or losing interest in things you normally enjoy, please come in for a visit.  - Violence - If anyone is threatening or hurting you, please call immediately.        Encounter for Medicare annual wellness exam - Primary    Reports upcoming skin surgery coordinated by Dr Evorn Gong with Dr Manley Mason at [facility] for her L scalp Has follow up with derm in October after that time UTD on vision and dental Unable to provide urine today  for urine micro albumin due to recent void Plans to get flu vaccine in October Undecided about shingles vaccine      Relevant Orders   Comprehensive metabolic panel   CBC with Differential/Platelet   Lipid Panel With LDL/HDL Ratio   Hemoglobin A1c   TSH + free T4   HLD (hyperlipidemia)    Chronic, elevated LDL goal of 55-70 with DM Repeat FLP lipitor at 40 mg, previously compliant despite outside of ASCVD risk age calculations  Pt also taking Omega 3 1g/QD      Relevant Orders   Lipid Panel With LDL/HDL Ratio   Return in about 6 months (around 01/06/2023) for chonic disease management.    Vonna Kotyk, FNP, have reviewed all documentation for this visit. The documentation on 07/06/22 for the exam, diagnosis, procedures, and orders are all accurate and complete.  [name], [place] [phone] (phone) [phone] (fax)  [place]

## 2022-07-06 ENCOUNTER — Encounter: Payer: Self-pay | Admitting: Family Medicine

## 2022-07-06 ENCOUNTER — Ambulatory Visit (INDEPENDENT_AMBULATORY_CARE_PROVIDER_SITE_OTHER): Payer: Medicare Other | Admitting: Family Medicine

## 2022-07-06 VITALS — BP 138/76 | HR 70 | Temp 97.9°F | Resp 16 | Ht 62.0 in | Wt 137.0 lb

## 2022-07-06 DIAGNOSIS — I10 Essential (primary) hypertension: Secondary | ICD-10-CM

## 2022-07-06 DIAGNOSIS — E7800 Pure hypercholesterolemia, unspecified: Secondary | ICD-10-CM | POA: Diagnosis not present

## 2022-07-06 DIAGNOSIS — H1013 Acute atopic conjunctivitis, bilateral: Secondary | ICD-10-CM

## 2022-07-06 DIAGNOSIS — E119 Type 2 diabetes mellitus without complications: Secondary | ICD-10-CM

## 2022-07-06 DIAGNOSIS — Z0000 Encounter for general adult medical examination without abnormal findings: Secondary | ICD-10-CM

## 2022-07-06 MED ORDER — OLOPATADINE HCL 0.2 % OP SOLN: 1.0000 [drp] | Freq: Four times a day (QID) | OPHTHALMIC | 11 refills | Status: DC | PRN

## 2022-07-06 NOTE — Assessment & Plan Note (Signed)
Chronic, variable  Has upcoming appt with Dr Edison Pace Reports acute on chronic clear discharge from bilateral eyes Recommend trial of pataday to assist

## 2022-07-06 NOTE — Assessment & Plan Note (Signed)
Chronic, previous above goal with A1c of 7.5% Continue to recommend balanced, lower carb meals. Smaller meal size, adding snacks. Choosing water as drink of choice and increasing purposeful exercise. Repeat A1c; report UTD on vision and dental Refused foot exam; denies complaints  Previously on glipizide 10 mg every other day

## 2022-07-06 NOTE — Assessment & Plan Note (Signed)
Reports upcoming skin surgery coordinated by Dr Evorn Gong with Dr Manley Mason at [facility] for her L scalp Has follow up with derm in October after that time UTD on vision and dental Unable to provide urine today for urine micro albumin due to recent void Plans to get flu vaccine in October Undecided about shingles vaccine

## 2022-07-06 NOTE — Assessment & Plan Note (Signed)
Chronic, elevated LDL goal of 55-70 with DM Repeat FLP lipitor at 40 mg, previously compliant despite outside of ASCVD risk age calculations  Pt also taking Omega 3 1g/QD

## 2022-07-06 NOTE — Assessment & Plan Note (Signed)
Chronic, borderline Goal of <130/<80 with associated dx of DM Currently on  -norvasc 10 mg -asa 81 mg -hctz 12.5 mg

## 2022-07-06 NOTE — Assessment & Plan Note (Signed)

## 2022-07-07 LAB — COMPREHENSIVE METABOLIC PANEL
ALT: 30 IU/L (ref 0–32)
AST: 27 IU/L (ref 0–40)
Albumin/Globulin Ratio: 1.6 (ref 1.2–2.2)
Albumin: 4.3 g/dL (ref 3.6–4.6)
Alkaline Phosphatase: 63 IU/L (ref 44–121)
BUN/Creatinine Ratio: 12 (ref 12–28)
BUN: 9 mg/dL — ABNORMAL LOW (ref 10–36)
Bilirubin Total: 0.6 mg/dL (ref 0.0–1.2)
CO2: 25 mmol/L (ref 20–29)
Calcium: 10 mg/dL (ref 8.7–10.3)
Chloride: 102 mmol/L (ref 96–106)
Creatinine, Ser: 0.78 mg/dL (ref 0.57–1.00)
Globulin, Total: 2.7 g/dL (ref 1.5–4.5)
Glucose: 172 mg/dL — ABNORMAL HIGH (ref 70–99)
Potassium: 4.2 mmol/L (ref 3.5–5.2)
Sodium: 141 mmol/L (ref 134–144)
Total Protein: 7 g/dL (ref 6.0–8.5)
eGFR: 72 mL/min/{1.73_m2} (ref >59)

## 2022-07-07 LAB — CBC WITH DIFFERENTIAL/PLATELET
Basophils Absolute: 0 10*3/uL (ref 0.0–0.2)
Basos: 1 %
EOS (ABSOLUTE): 0.1 10*3/uL (ref 0.0–0.4)
Eos: 1 %
Hematocrit: 44.5 % (ref 34.0–46.6)
Hemoglobin: 15.1 g/dL (ref 11.1–15.9)
Immature Grans (Abs): 0 10*3/uL (ref 0.0–0.1)
Immature Granulocytes: 0 %
Lymphocytes Absolute: 2.7 10*3/uL (ref 0.7–3.1)
Lymphs: 37 %
MCH: 30.4 pg (ref 26.6–33.0)
MCHC: 33.9 g/dL (ref 31.5–35.7)
MCV: 90 fL (ref 79–97)
Monocytes Absolute: 0.5 10*3/uL (ref 0.1–0.9)
Monocytes: 6 %
Neutrophils Absolute: 4.1 10*3/uL (ref 1.4–7.0)
Neutrophils: 55 %
Platelets: 158 10*3/uL (ref 150–450)
RBC: 4.97 x10E6/uL (ref 3.77–5.28)
RDW: 12.2 % (ref 11.7–15.4)
WBC: 7.5 10*3/uL (ref 3.4–10.8)

## 2022-07-07 LAB — LIPID PANEL WITH LDL/HDL RATIO
Cholesterol, Total: 242 mg/dL — ABNORMAL HIGH (ref 100–199)
HDL: 46 mg/dL (ref >39)
LDL Chol Calc (NIH): 147 mg/dL — ABNORMAL HIGH (ref 0–99)
LDL/HDL Ratio: 3.2 ratio (ref 0.0–3.2)
Triglycerides: 267 mg/dL — ABNORMAL HIGH (ref 0–149)
VLDL Cholesterol Cal: 49 mg/dL — ABNORMAL HIGH (ref 5–40)

## 2022-07-07 LAB — TSH+FREE T4
Free T4: 1.06 ng/dL (ref 0.82–1.77)
TSH: 2.04 u[IU]/mL (ref 0.450–4.500)

## 2022-07-07 LAB — HEMOGLOBIN A1C
Est. average glucose Bld gHb Est-mCnc: 183 mg/dL
Hgb A1c MFr Bld: 8 % — ABNORMAL HIGH (ref 4.8–5.6)

## 2022-07-07 NOTE — Progress Notes (Signed)
Stable chemistry; slight increase in cholesterol. I recommend diet low in saturated fat and regular exercise - 30 min at least 5 times per week  A1c has increased to 8%; recommend use of additional day of Glipizide 10 mg medication to assist. Continue to recommend balanced, lower carb meals. Smaller meal size, adding snacks. Choosing water as drink of choice and increasing purposeful exercise.  All other labs normal and stable.  Gwyneth Sprout, [address] [place], [place] [address] [phone] (phone) [phone] (fax) [place]

## 2022-07-13 DIAGNOSIS — L578 Other skin changes due to chronic exposure to nonionizing radiation: Secondary | ICD-10-CM | POA: Diagnosis not present

## 2022-07-13 DIAGNOSIS — L988 Other specified disorders of the skin and subcutaneous tissue: Secondary | ICD-10-CM | POA: Diagnosis not present

## 2022-07-13 DIAGNOSIS — C44319 Basal cell carcinoma of skin of other parts of face: Secondary | ICD-10-CM | POA: Diagnosis not present

## 2022-07-13 DIAGNOSIS — L814 Other melanin hyperpigmentation: Secondary | ICD-10-CM | POA: Diagnosis not present

## 2022-07-13 HISTORY — PX: MOHS SURGERY: SHX181

## 2022-07-14 ENCOUNTER — Other Ambulatory Visit: Payer: Self-pay | Admitting: Family Medicine

## 2022-07-14 DIAGNOSIS — E7800 Pure hypercholesterolemia, unspecified: Secondary | ICD-10-CM

## 2022-07-20 ENCOUNTER — Other Ambulatory Visit: Payer: Self-pay | Admitting: Family Medicine

## 2022-07-20 DIAGNOSIS — I10 Essential (primary) hypertension: Secondary | ICD-10-CM

## 2022-07-21 ENCOUNTER — Telehealth: Payer: Self-pay | Admitting: Family Medicine

## 2022-07-21 NOTE — Telephone Encounter (Signed)
Pt called reporting that her PCP did not call all of her prescriptions into her pharmacy on file per her last appt.   Please advise

## 2022-07-22 ENCOUNTER — Other Ambulatory Visit: Payer: Self-pay | Admitting: Family Medicine

## 2022-07-22 DIAGNOSIS — E119 Type 2 diabetes mellitus without complications: Secondary | ICD-10-CM

## 2022-07-22 DIAGNOSIS — I10 Essential (primary) hypertension: Secondary | ICD-10-CM

## 2022-07-22 DIAGNOSIS — E7800 Pure hypercholesterolemia, unspecified: Secondary | ICD-10-CM

## 2022-07-22 MED ORDER — ATORVASTATIN CALCIUM 40 MG PO TABS: 40.0000 mg | ORAL_TABLET | Freq: Every day | ORAL | 3 refills | Status: DC

## 2022-07-22 MED ORDER — AMLODIPINE BESYLATE 10 MG PO TABS: 10.0000 mg | ORAL_TABLET | Freq: Every day | ORAL | 3 refills | Status: DC

## 2022-07-22 MED ORDER — HYDROCHLOROTHIAZIDE 12.5 MG PO TABS: 12.5000 mg | ORAL_TABLET | ORAL | 3 refills | Status: DC

## 2022-07-22 MED ORDER — GLIPIZIDE ER 10 MG PO TB24: 10.0000 mg | ORAL_TABLET | ORAL | 3 refills | Status: DC

## 2022-07-31 ENCOUNTER — Telehealth: Payer: Self-pay | Admitting: Family Medicine

## 2022-07-31 DIAGNOSIS — F5101 Primary insomnia: Secondary | ICD-10-CM

## 2022-07-31 NOTE — Telephone Encounter (Signed)
Patient want to know why her Temazepam was discontinue? Please advise.

## 2022-08-03 ENCOUNTER — Other Ambulatory Visit: Payer: Self-pay | Admitting: Family Medicine

## 2022-08-03 MED ORDER — TEMAZEPAM 15 MG PO CAPS: 15.0000 mg | ORAL_CAPSULE | Freq: Every evening | ORAL | 3 refills | Status: DC | PRN

## 2022-08-03 MED ORDER — TRAZODONE HCL 50 MG PO TABS: 25.0000 mg | ORAL_TABLET | Freq: Every evening | ORAL | 3 refills | Status: DC | PRN

## 2022-08-03 NOTE — Telephone Encounter (Addendum)
This pt came in the office to see the status of this medication refill.  She states that she does not remember you asking about this but she was not having trouble sleeping because she was on this medication to help her sleep.  She states that she did not have it over the weekend and could not sleep.  She would like someone to call her and let her know when this has been called in at [phone].  If no answer call her home number that is on file.  Send to [facility] on [address].  Please advise.

## 2022-08-12 DIAGNOSIS — H903 Sensorineural hearing loss, bilateral: Secondary | ICD-10-CM | POA: Diagnosis not present

## 2022-08-12 DIAGNOSIS — H6123 Impacted cerumen, bilateral: Secondary | ICD-10-CM | POA: Diagnosis not present

## 2022-08-19 DIAGNOSIS — Z961 Presence of intraocular lens: Secondary | ICD-10-CM | POA: Diagnosis not present

## 2022-08-19 DIAGNOSIS — H2512 Age-related nuclear cataract, left eye: Secondary | ICD-10-CM | POA: Diagnosis not present

## 2022-08-31 DIAGNOSIS — C4491 Basal cell carcinoma of skin, unspecified: Secondary | ICD-10-CM | POA: Diagnosis not present

## 2022-08-31 DIAGNOSIS — L905 Scar conditions and fibrosis of skin: Secondary | ICD-10-CM | POA: Diagnosis not present

## 2022-09-01 DIAGNOSIS — C44622 Squamous cell carcinoma of skin of right upper limb, including shoulder: Secondary | ICD-10-CM | POA: Diagnosis not present

## 2022-09-01 DIAGNOSIS — D0461 Carcinoma in situ of skin of right upper limb, including shoulder: Secondary | ICD-10-CM | POA: Diagnosis not present

## 2022-09-02 DIAGNOSIS — H524 Presbyopia: Secondary | ICD-10-CM | POA: Diagnosis not present

## 2022-09-15 DIAGNOSIS — D0462 Carcinoma in situ of skin of left upper limb, including shoulder: Secondary | ICD-10-CM | POA: Diagnosis not present

## 2022-09-15 DIAGNOSIS — D0439 Carcinoma in situ of skin of other parts of face: Secondary | ICD-10-CM | POA: Diagnosis not present

## 2022-10-26 ENCOUNTER — Telehealth: Payer: Self-pay | Admitting: Family Medicine

## 2022-10-26 NOTE — Telephone Encounter (Signed)
Patient is requesting a refill on  oxybutynin (DITROPAN XL) 10 MG 24 hr tablet.  She would like it sent to [facility] on [address]

## 2022-10-26 NOTE — Telephone Encounter (Signed)
Medication not on current list  

## 2022-11-13 ENCOUNTER — Ambulatory Visit
Admission: EM | Admit: 2022-11-13 | Discharge: 2022-11-13 | Disposition: A | Discharge status: Home / Self Care | Payer: Medicare Other | Attending: Emergency Medicine | Admitting: Emergency Medicine

## 2022-11-13 DIAGNOSIS — U071 COVID-19: Secondary | ICD-10-CM | POA: Diagnosis not present

## 2022-11-13 MED ORDER — MOLNUPIRAVIR EUA 200MG CAPSULE: 4.0000 | ORAL_CAPSULE | Freq: Two times a day (BID) | ORAL | 0 refills | Status: AC

## 2022-11-13 NOTE — ED Triage Notes (Signed)
Pt reports scratchy throat, HA, cough, fever, fatigue starting last night. Had positive COVID test at home this morning.

## 2022-11-13 NOTE — Discharge Instructions (Addendum)
Take the molnurpiravir as directed for COVID.  Follow up with your primary care provider.    Go to the emergency department if you have shortness of breath or other concerning symptoms.

## 2022-11-13 NOTE — ED Provider Notes (Signed)
[name]    CSN: [id number] Arrival date & time: 11/13/22  1511      History   Chief Complaint Chief Complaint  Patient presents with   Covid Positive    HPI [name] is a 88 y.o. female.  Patient presents with 1 day history of fever, fatigue, body aches, headache, scratchy throat, cough.  Positive COVID test at home this morning.  She denies chest pain, shortness of breath, vomiting, diarrhea, or other symptoms.  Her medical history includes hypertension and diabetes.  The history is provided by the patient and medical records.    Past Medical History:  Diagnosis Date   Acid reflux 08/05/2015   Cancer ([place])    skin   Colon polyp 08/05/2015   DD (diverticular disease) 08/05/2015   Diabetes mellitus without complication ([place])    type 2   Eczema of both upper extremities 06/10/2021   Hyperlipidemia    Hypertension    IBS (irritable bowel syndrome)    Irritable bowel syndrome with diarrhea 04/20/2017   LBP (low back pain) 08/05/2015   Melanoma ([place])     Patient Active Problem List   Diagnosis Date Noted   Annual physical exam 07/06/2022   Allergic conjunctivitis of both eyes 07/06/2022   Chronic idiopathic constipation 09/30/2021   Chronic bilateral low back pain without sciatica 09/30/2021   Encounter for Medicare annual wellness exam 06/10/2021   [place] rash of groin 06/10/2021   Vaginal dryness 06/10/2021   Murmur, cardiac 06/10/2021   Diabetes mellitus without complication ([place]) 79/12/4095   Aortic heart valve narrowing 08/05/2015   Breast cyst 08/05/2015   Carotid artery narrowing 08/05/2015   History of CVA (cerebrovascular accident) 08/05/2015   Elevated CK 08/05/2015   Headache disorder 08/05/2015   Calcium blood increased 08/05/2015   HLD (hyperlipidemia) 08/05/2015   Essential hypertension 08/05/2015   Insomnia 08/05/2015   OP (osteoporosis) 08/05/2015   Adiposity 08/05/2015   Abnormal Pap smear of vagina 08/05/2015   Hemorrhage,  postmenopausal 08/05/2015   Superficial thrombophlebitis 08/05/2015   FOM (frequency of micturition) 08/05/2015   Fatigue 08/05/2015    Past Surgical History:  Procedure Laterality Date   BREAST BIOPSY Right    neg   BREAST SURGERY     1980's   CAROTID ARTERY ANGIOPLASTY Left 01/18/2015   Dr. Delana Meyer [facility]   CHOLECYSTECTOMY  06/27/2012   DECOMPRESSIVE LUMBAR LAMINECTOMY LEVEL 1  1950   EYE SURGERY Right 2012   cataract extraction   SKIN SURGERY      OB History   No obstetric history on file.      Home Medications    Prior to Admission medications   Medication Sig Start Date End Date Taking? Authorizing Provider  amLODipine (NORVASC) 10 MG tablet Take 1 tablet (10 mg total) by mouth daily. 07/22/22  Yes Gwyneth Sprout, FNP  aspirin EC 81 MG tablet Take 81 mg by mouth daily.    Yes [provider]  atorvastatin (LIPITOR) 40 MG tablet Take 1 tablet (40 mg total) by mouth daily. 07/22/22  Yes Tally Joe T, FNP  cholecalciferol (VITAMIN D) 1000 UNITS tablet Take 1,000 Units by mouth daily.    Yes [provider]  glipiZIDE (GLUCOTROL XL) 10 MG 24 hr tablet Take 1 tablet (10 mg total) by mouth every other day. 07/22/22  Yes Gwyneth Sprout, FNP  hydrochlorothiazide (HYDRODIURIL) 12.5 MG tablet Take 1 tablet (12.5 mg total) by mouth every other day. 07/22/22  Yes Rollene Rotunda,  Jaci Standard, FNP  molnupiravir EUA (LAGEVRIO) 200 mg CAPS capsule Take 4 capsules (800 mg total) by mouth 2 (two) times daily for 5 days. 11/13/22 11/18/22 Yes Sharion Balloon, NP  OMEGA-3 FATTY ACIDS PO Take 1 capsule by mouth daily.    Yes [provider]  temazepam (RESTORIL) 15 MG capsule Take 1 capsule (15 mg total) by mouth at bedtime as needed for sleep. 08/03/22  Yes Gwyneth Sprout, FNP  traZODone (DESYREL) 50 MG tablet Take 0.5-1 tablets (25-50 mg total) by mouth at bedtime as needed for sleep. 08/03/22  Yes Gwyneth Sprout, FNP    Family History Family History  Problem Relation Age of Onset    Ovarian cancer Mother    Heart disease Brother    Heart attack Brother    Gout Brother    Alzheimer's disease Brother    Dementia Brother    Heart attack Brother    Diabetes Daughter    Melanoma Daughter    Stroke Son    Breast cancer Neg Hx     Social History Social History   Tobacco Use   Smoking status: Former    Packs/day: 0.50    Types: Cigarettes    Quit date: 11/15/1976    Years since quitting: 46.0   Smokeless tobacco: Never  Vaping Use   Vaping Use: Never used  Substance Use Topics   Alcohol use: No   Drug use: No     Allergies   Cephalosporins, Codeine, Macrolides and ketolides, Nitrofurantoin, Penicillins, Tramadol, Doxycycline, and Sulfa antibiotics   Review of Systems Review of Systems  Constitutional:  Positive for fatigue and fever. Negative for chills.  HENT:  Positive for sore throat. Negative for ear pain.   Respiratory:  Positive for cough. Negative for shortness of breath.   Cardiovascular:  Negative for chest pain and palpitations.  Gastrointestinal:  Negative for diarrhea and vomiting.  Skin:  Negative for color change and rash.  Neurological:  Positive for headaches.  All other systems reviewed and are negative.    Physical Exam Triage Vital Signs ED Triage Vitals  Enc Vitals Group     BP 11/13/22 1917 (!) 150/82     Pulse Rate 11/13/22 1917 80     Resp 11/13/22 1917 18     Temp 11/13/22 1917 100.3 F (37.9 C)     Temp Source 11/13/22 1917 Oral     SpO2 11/13/22 1917 97 %     Weight --      Height --      Head Circumference --      Peak Flow --      Pain Score 11/13/22 1915 0     Pain Loc --      Pain Edu? --      Excl. in [place]? --    No data found.  Updated Vital Signs BP (!) 150/82 (BP Location: Left Wrist)   Pulse 80   Temp 100.3 F (37.9 C) (Oral)   Resp 18   SpO2 97%   Visual Acuity Right Eye Distance:   Left Eye Distance:   Bilateral Distance:    Right Eye Near:   Left Eye Near:    Bilateral Near:      Physical Exam Vitals and nursing note reviewed.  Constitutional:      General: She is not in acute distress.    Appearance: Normal appearance. She is well-developed. She is not ill-appearing.  HENT:     Right Ear: Tympanic  membrane normal.     Left Ear: Tympanic membrane normal.     Nose: Nose normal.     Mouth/Throat:     Mouth: Mucous membranes are moist.     Pharynx: Oropharynx is clear.  Eyes:     Conjunctiva/sclera: Conjunctivae normal.  Cardiovascular:     Rate and Rhythm: Normal rate and regular rhythm.     Heart sounds: Normal heart sounds.  Pulmonary:     Effort: Pulmonary effort is normal. No respiratory distress.     Breath sounds: Normal breath sounds.  Musculoskeletal:     Cervical back: Neck supple.  Skin:    General: Skin is warm and dry.  Neurological:     Mental Status: She is alert.  Psychiatric:        Mood and Affect: Mood normal.        Behavior: Behavior normal.      UC Treatments / Results  Labs (all labs ordered are listed, but only abnormal results are displayed) Labs Reviewed - No data to display  EKG   Radiology No results found.  Procedures Procedures (including critical care time)  Medications Ordered in UC Medications - No data to display  Initial Impression / Assessment and Plan / UC Course  I have reviewed the triage vital signs and the nursing notes.  Pertinent labs & imaging results that were available during my care of the patient were reviewed by me and considered in my medical decision making (see chart for details).   COVID-19.  Discussed treatment options.  Treating with molnupiravir.  Discussed that this is an emergency authorized medication for treatment of COVID.  Discussed side effects including nausea, diarrhea, dizziness.  Discussed other symptomatic treatment including Tylenol, rest, hydration.  Instructed patient to follow-up with her PCP if symptoms are not improving.  ED precautions discussed.  Patient agrees  to plan of care.    Final Clinical Impressions(s) / UC Diagnoses   Final diagnoses:  [id number]     Discharge Instructions      Take the molnurpiravir as directed for COVID.  Follow up with your primary care provider.    Go to the emergency department if you have shortness of breath or other concerning symptoms.     ED Prescriptions     Medication Sig Dispense Auth. Provider   molnupiravir EUA (LAGEVRIO) 200 mg CAPS capsule Take 4 capsules (800 mg total) by mouth 2 (two) times daily for 5 days. 40 capsule Sharion Balloon, NP      PDMP not reviewed this encounter.   Sharion Balloon, NP 11/13/22 1944

## 2022-12-21 DIAGNOSIS — D2261 Melanocytic nevi of right upper limb, including shoulder: Secondary | ICD-10-CM | POA: Diagnosis not present

## 2022-12-21 DIAGNOSIS — L570 Actinic keratosis: Secondary | ICD-10-CM | POA: Diagnosis not present

## 2022-12-21 DIAGNOSIS — X32XXXA Exposure to sunlight, initial encounter: Secondary | ICD-10-CM | POA: Diagnosis not present

## 2022-12-21 DIAGNOSIS — C44222 Squamous cell carcinoma of skin of right ear and external auricular canal: Secondary | ICD-10-CM | POA: Diagnosis not present

## 2022-12-21 DIAGNOSIS — R208 Other disturbances of skin sensation: Secondary | ICD-10-CM | POA: Diagnosis not present

## 2022-12-21 DIAGNOSIS — D225 Melanocytic nevi of trunk: Secondary | ICD-10-CM | POA: Diagnosis not present

## 2022-12-21 DIAGNOSIS — D485 Neoplasm of uncertain behavior of skin: Secondary | ICD-10-CM | POA: Diagnosis not present

## 2022-12-21 DIAGNOSIS — Z85828 Personal history of other malignant neoplasm of skin: Secondary | ICD-10-CM | POA: Diagnosis not present

## 2022-12-21 DIAGNOSIS — D2262 Melanocytic nevi of left upper limb, including shoulder: Secondary | ICD-10-CM | POA: Diagnosis not present

## 2023-01-11 DIAGNOSIS — D0421 Carcinoma in situ of skin of right ear and external auricular canal: Secondary | ICD-10-CM | POA: Diagnosis not present

## 2023-01-11 DIAGNOSIS — C44222 Squamous cell carcinoma of skin of right ear and external auricular canal: Secondary | ICD-10-CM | POA: Diagnosis not present

## 2023-02-08 DIAGNOSIS — H6123 Impacted cerumen, bilateral: Secondary | ICD-10-CM | POA: Diagnosis not present

## 2023-02-08 DIAGNOSIS — H903 Sensorineural hearing loss, bilateral: Secondary | ICD-10-CM | POA: Diagnosis not present

## 2023-02-15 ENCOUNTER — Other Ambulatory Visit: Payer: Self-pay

## 2023-02-15 ENCOUNTER — Observation Stay: Payer: Medicare Other

## 2023-02-15 ENCOUNTER — Encounter: Payer: Self-pay | Admitting: Intensive Care

## 2023-02-15 ENCOUNTER — Inpatient Hospital Stay
Admission: EM | Admit: 2023-02-15 | Discharge: 2023-02-18 | DRG: 378 | Disposition: A | Discharge status: Home / Self Care | Payer: Medicare Other | Attending: Internal Medicine | Admitting: Internal Medicine

## 2023-02-15 DIAGNOSIS — Z7984 Long term (current) use of oral hypoglycemic drugs: Secondary | ICD-10-CM | POA: Diagnosis not present

## 2023-02-15 DIAGNOSIS — Z8673 Personal history of transient ischemic attack (TIA), and cerebral infarction without residual deficits: Secondary | ICD-10-CM

## 2023-02-15 DIAGNOSIS — I350 Nonrheumatic aortic (valve) stenosis: Secondary | ICD-10-CM

## 2023-02-15 DIAGNOSIS — E785 Hyperlipidemia, unspecified: Secondary | ICD-10-CM | POA: Diagnosis not present

## 2023-02-15 DIAGNOSIS — Z79899 Other long term (current) drug therapy: Secondary | ICD-10-CM

## 2023-02-15 DIAGNOSIS — D649 Anemia, unspecified: Principal | ICD-10-CM

## 2023-02-15 DIAGNOSIS — K921 Melena: Principal | ICD-10-CM | POA: Diagnosis present

## 2023-02-15 DIAGNOSIS — Z820 Family history of epilepsy and other diseases of the nervous system: Secondary | ICD-10-CM | POA: Diagnosis not present

## 2023-02-15 DIAGNOSIS — Z7982 Long term (current) use of aspirin: Secondary | ICD-10-CM

## 2023-02-15 DIAGNOSIS — K3189 Other diseases of stomach and duodenum: Secondary | ICD-10-CM | POA: Diagnosis not present

## 2023-02-15 DIAGNOSIS — E1165 Type 2 diabetes mellitus with hyperglycemia: Secondary | ICD-10-CM | POA: Diagnosis present

## 2023-02-15 DIAGNOSIS — Z8249 Family history of ischemic heart disease and other diseases of the circulatory system: Secondary | ICD-10-CM

## 2023-02-15 DIAGNOSIS — K5730 Diverticulosis of large intestine without perforation or abscess without bleeding: Secondary | ICD-10-CM | POA: Diagnosis present

## 2023-02-15 DIAGNOSIS — Z8041 Family history of malignant neoplasm of ovary: Secondary | ICD-10-CM

## 2023-02-15 DIAGNOSIS — D62 Acute posthemorrhagic anemia: Secondary | ICD-10-CM | POA: Diagnosis present

## 2023-02-15 DIAGNOSIS — I5032 Chronic diastolic (congestive) heart failure: Secondary | ICD-10-CM | POA: Diagnosis present

## 2023-02-15 DIAGNOSIS — Z85828 Personal history of other malignant neoplasm of skin: Secondary | ICD-10-CM

## 2023-02-15 DIAGNOSIS — E1169 Type 2 diabetes mellitus with other specified complication: Secondary | ICD-10-CM | POA: Diagnosis not present

## 2023-02-15 DIAGNOSIS — M545 Low back pain, unspecified: Secondary | ICD-10-CM | POA: Diagnosis present

## 2023-02-15 DIAGNOSIS — I110 Hypertensive heart disease with heart failure: Secondary | ICD-10-CM | POA: Diagnosis present

## 2023-02-15 DIAGNOSIS — Z823 Family history of stroke: Secondary | ICD-10-CM | POA: Diagnosis not present

## 2023-02-15 DIAGNOSIS — E119 Type 2 diabetes mellitus without complications: Secondary | ICD-10-CM

## 2023-02-15 DIAGNOSIS — Z85820 Personal history of malignant melanoma of skin: Secondary | ICD-10-CM

## 2023-02-15 DIAGNOSIS — K635 Polyp of colon: Secondary | ICD-10-CM | POA: Diagnosis not present

## 2023-02-15 DIAGNOSIS — I10 Essential (primary) hypertension: Secondary | ICD-10-CM | POA: Diagnosis not present

## 2023-02-15 DIAGNOSIS — Z833 Family history of diabetes mellitus: Secondary | ICD-10-CM

## 2023-02-15 DIAGNOSIS — K640 First degree hemorrhoids: Secondary | ICD-10-CM | POA: Diagnosis not present

## 2023-02-15 DIAGNOSIS — Z87891 Personal history of nicotine dependence: Secondary | ICD-10-CM

## 2023-02-15 DIAGNOSIS — D124 Benign neoplasm of descending colon: Secondary | ICD-10-CM | POA: Diagnosis present

## 2023-02-15 DIAGNOSIS — C180 Malignant neoplasm of cecum: Secondary | ICD-10-CM | POA: Diagnosis not present

## 2023-02-15 DIAGNOSIS — E876 Hypokalemia: Secondary | ICD-10-CM | POA: Diagnosis not present

## 2023-02-15 DIAGNOSIS — K6389 Other specified diseases of intestine: Secondary | ICD-10-CM | POA: Diagnosis not present

## 2023-02-15 DIAGNOSIS — Z808 Family history of malignant neoplasm of other organs or systems: Secondary | ICD-10-CM

## 2023-02-15 DIAGNOSIS — I509 Heart failure, unspecified: Secondary | ICD-10-CM | POA: Diagnosis not present

## 2023-02-15 DIAGNOSIS — K2970 Gastritis, unspecified, without bleeding: Secondary | ICD-10-CM | POA: Diagnosis present

## 2023-02-15 DIAGNOSIS — Z818 Family history of other mental and behavioral disorders: Secondary | ICD-10-CM

## 2023-02-15 DIAGNOSIS — K2960 Other gastritis without bleeding: Secondary | ICD-10-CM | POA: Diagnosis not present

## 2023-02-15 LAB — COMPREHENSIVE METABOLIC PANEL
ALT: 28 U/L (ref 0–44)
AST: 40 U/L (ref 15–41)
Albumin: 4 g/dL (ref 3.5–5.0)
Alkaline Phosphatase: 66 U/L (ref 38–126)
Anion gap: 11 (ref 5–15)
BUN: 16 mg/dL (ref 8–23)
CO2: 24 mmol/L (ref 22–32)
Calcium: 9.3 mg/dL (ref 8.9–10.3)
Chloride: 101 mmol/L (ref 98–111)
Creatinine, Ser: 0.87 mg/dL (ref 0.44–1.00)
GFR, Estimated: >60 mL/min (ref >60)
Glucose, Bld: 259 mg/dL — ABNORMAL HIGH (ref 70–99)
Potassium: 3.4 mmol/L — ABNORMAL LOW (ref 3.5–5.1)
Sodium: 136 mmol/L (ref 135–145)
Total Bilirubin: 0.8 mg/dL (ref 0.3–1.2)
Total Protein: 7.3 g/dL (ref 6.5–8.1)

## 2023-02-15 LAB — CBC
HCT: 33.2 % — ABNORMAL LOW (ref 36.0–46.0)
Hemoglobin: 10.7 g/dL — ABNORMAL LOW (ref 12.0–15.0)
MCH: 29.9 pg (ref 26.0–34.0)
MCHC: 32.2 g/dL (ref 30.0–36.0)
MCV: 92.7 fL (ref 80.0–100.0)
Platelets: 183 10*3/uL (ref 150–400)
RBC: 3.58 MIL/uL — ABNORMAL LOW (ref 3.87–5.11)
RDW: 14.1 % (ref 11.5–15.5)
WBC: 8.9 10*3/uL (ref 4.0–10.5)
nRBC: 0 % (ref 0.0–0.2)

## 2023-02-15 LAB — TYPE AND SCREEN
ABO/RH(D): A NEG
Antibody Screen: NEGATIVE

## 2023-02-15 LAB — POC OCCULT BLOOD, ED

## 2023-02-15 LAB — HEMOGLOBIN AND HEMATOCRIT, BLOOD
HCT: 32.9 % — ABNORMAL LOW (ref 36.0–46.0)
Hemoglobin: 10.4 g/dL — ABNORMAL LOW (ref 12.0–15.0)

## 2023-02-15 LAB — URINALYSIS, ROUTINE W REFLEX MICROSCOPIC
Bilirubin Urine: NEGATIVE
Glucose, UA: NEGATIVE mg/dL
Hgb urine dipstick: NEGATIVE
Ketones, ur: NEGATIVE mg/dL
Leukocytes,Ua: NEGATIVE
Nitrite: NEGATIVE
Protein, ur: NEGATIVE mg/dL
Specific Gravity, Urine: >1.046 — ABNORMAL HIGH (ref 1.005–1.030)
pH: 7 (ref 5.0–8.0)

## 2023-02-15 LAB — IRON AND TIBC
Iron: 33 ug/dL (ref 28–170)
Saturation Ratios: 7 % — ABNORMAL LOW (ref 10.4–31.8)
TIBC: 455 ug/dL — ABNORMAL HIGH (ref 250–450)
UIBC: 422 ug/dL

## 2023-02-15 LAB — FERRITIN: Ferritin: 15 ng/mL (ref 11–307)

## 2023-02-15 LAB — VITAMIN B12: Vitamin B-12: 259 pg/mL (ref 180–914)

## 2023-02-15 LAB — TROPONIN I (HIGH SENSITIVITY): Troponin I (High Sensitivity): 14 ng/L (ref <18)

## 2023-02-15 MED ORDER — ONDANSETRON HCL 4 MG PO TABS: 4.0000 mg | ORAL_TABLET | Freq: Four times a day (QID) | ORAL | Status: DC | PRN

## 2023-02-15 MED ORDER — ACETAMINOPHEN 650 MG RE SUPP: 650.0000 mg | Freq: Four times a day (QID) | RECTAL | Status: DC | PRN

## 2023-02-15 MED ORDER — TRAZODONE HCL 50 MG PO TABS
25.0000 mg | ORAL_TABLET | Freq: Once | ORAL | Status: AC
  Administered 2023-02-16: 25 mg via ORAL
  Filled 2023-02-15: qty 1

## 2023-02-15 MED ORDER — SENNOSIDES-DOCUSATE SODIUM 8.6-50 MG PO TABS
1.0000 | ORAL_TABLET | Freq: Every evening | ORAL | Status: DC | PRN
  Administered 2023-02-15: 1 via ORAL
  Filled 2023-02-15: qty 1

## 2023-02-15 MED ORDER — PANTOPRAZOLE SODIUM 40 MG IV SOLR
40.0000 mg | Freq: Two times a day (BID) | INTRAVENOUS | Status: DC
  Administered 2023-02-16 – 2023-02-18 (×5): 40 mg via INTRAVENOUS
  Filled 2023-02-15 (×5): qty 10

## 2023-02-15 MED ORDER — LACTATED RINGERS IV SOLN: INTRAVENOUS | Status: DC

## 2023-02-15 MED ORDER — IOHEXOL 350 MG/ML SOLN
100.0000 mL | Freq: Once | INTRAVENOUS | Status: AC | PRN
  Administered 2023-02-15: 100 mL via INTRAVENOUS

## 2023-02-15 MED ORDER — AMLODIPINE BESYLATE 10 MG PO TABS
10.0000 mg | ORAL_TABLET | Freq: Every day | ORAL | Status: DC
  Administered 2023-02-16 – 2023-02-18 (×2): 10 mg via ORAL
  Filled 2023-02-15 (×2): qty 1

## 2023-02-15 MED ORDER — SODIUM CHLORIDE 0.9 % IV SOLN
200.0000 mg | Freq: Once | INTRAVENOUS | Status: AC
  Administered 2023-02-15: 200 mg via INTRAVENOUS
  Filled 2023-02-15: qty 10

## 2023-02-15 MED ORDER — PANTOPRAZOLE SODIUM 40 MG IV SOLR
40.0000 mg | Freq: Once | INTRAVENOUS | Status: AC
  Administered 2023-02-15: 40 mg via INTRAVENOUS
  Filled 2023-02-15: qty 10

## 2023-02-15 MED ORDER — ACETAMINOPHEN 325 MG PO TABS
650.0000 mg | ORAL_TABLET | Freq: Four times a day (QID) | ORAL | Status: DC | PRN
  Administered 2023-02-15 – 2023-02-16 (×2): 650 mg via ORAL
  Filled 2023-02-15 (×2): qty 2

## 2023-02-15 MED ORDER — ONDANSETRON HCL 4 MG/2ML IJ SOLN
4.0000 mg | Freq: Four times a day (QID) | INTRAMUSCULAR | Status: DC | PRN
  Administered 2023-02-16: 4 mg via INTRAVENOUS
  Filled 2023-02-15: qty 2

## 2023-02-15 MED ORDER — POTASSIUM CHLORIDE CRYS ER 20 MEQ PO TBCR
40.0000 meq | EXTENDED_RELEASE_TABLET | Freq: Once | ORAL | Status: AC
  Administered 2023-02-15: 40 meq via ORAL
  Filled 2023-02-15: qty 2

## 2023-02-15 NOTE — H&P (Addendum)
History and Physical:    [name]   [id number] DOB: 1929/07/01 DOA: 02/15/2023  Referring MD/provider: Marjean Donna, MD PCP: Gwyneth Sprout, FNP   Patient coming from: Home  Chief Complaint: Black stools  History of Present Illness:   [name] is a 89 y.o. female with medical history significant for moderate aortic stenosis, stroke on low-dose aspirin, type 2 diabetes mellitus, diverticulosis, colonic polyps, hyperlipidemia, hypertension, IBS, chronic low back pain, GERD, melanoma, who presented to the hospital with 2 weeks duration of black stools (melena).  She states she has been having intermittent black stools for 2 weeks.  She also complained of lower abdominal pain of about 2 weeks duration.  She has had back pain for a few weeks.  She does not think she has chronic low back pain but chart review shows that she has a medical history of low back pain.  She has presents dizziness and easy fatigability in the last 2 days preceding admission.  No fever, chills, vomiting, hematemesis, upper abdominal pain, urinary symptoms, chest pain, shortness of breath or palpitations.  She has been taking low-dose aspirin for years. ED Course:  The patient   ROS:   ROS all other systems reviewed were negative  Past Medical History:   Past Medical History:  Diagnosis Date   Acid reflux 08/05/2015   Cancer    skin   Colon polyp 08/05/2015   DD (diverticular disease) 08/05/2015   Diabetes mellitus without complication    type 2   Eczema of both upper extremities 06/10/2021   Hyperlipidemia    Hypertension    IBS (irritable bowel syndrome)    Irritable bowel syndrome with diarrhea 04/20/2017   LBP (low back pain) 08/05/2015   Melanoma     Past Surgical History:   Past Surgical History:  Procedure Laterality Date   BREAST BIOPSY Right    neg   BREAST SURGERY     1980's   CAROTID ARTERY ANGIOPLASTY Left 01/18/2015   Dr. Delana Meyer [facility]   CHOLECYSTECTOMY  06/27/2012    DECOMPRESSIVE LUMBAR LAMINECTOMY LEVEL 1  1950   EYE SURGERY Right 2012   cataract extraction   SKIN SURGERY      Social History:   Social History   Socioeconomic History   Marital status: Widowed    Spouse name: Not on file   Number of children: 2   Years of education: Not on file   Highest education level: 12th grade  Occupational History   Occupation: retired  Tobacco Use   Smoking status: Former    Packs/day: .5    Types: Cigarettes    Quit date: 11/15/1976    Years since quitting: 46.2   Smokeless tobacco: Never  Vaping Use   Vaping Use: Never used  Substance and Sexual Activity   Alcohol use: No   Drug use: No   Sexual activity: Not Currently    Birth control/protection: Post-menopausal  Other Topics Concern   Not on file  Social History Narrative   Not on file   Social Determinants of Health   Financial Resource Strain: Low Risk  (05/09/2020)   Overall Financial Resource Strain (CARDIA)    Difficulty of Paying Living Expenses: Not hard at all  Food Insecurity: No [place] (05/09/2020)   Hunger Vital Sign    Worried About Running Out of Food in the Last Year: Never true    [place] in the Last Year: Never true  Transportation Needs:  No Transportation Needs (05/09/2020)   PRAPARE - Hydrologist (Medical): No    Lack of Transportation (Non-Medical): No  Physical Activity: Inactive (05/09/2020)   Exercise Vital Sign    Days of Exercise per Week: 0 days    Minutes of Exercise per Session: 0 min  Stress: No Stress Concern Present (05/09/2020)   Angels    Feeling of Stress : Not at all  Social Connections: Moderately Isolated (05/09/2020)   Social Connection and Isolation Panel [NHANES]    Frequency of Communication with Friends and Family: Twice a week    Frequency of Social Gatherings with Friends and Family: Twice a week    Attends Religious  Services: More than 4 times per year    Active Member of [employer] or Organizations: No    Attends Archivist Meetings: Never    Marital Status: Widowed  Intimate Partner Violence: Not At Risk (05/09/2020)   Humiliation, Afraid, Rape, and Kick questionnaire    Fear of Current or Ex-Partner: No    Emotionally Abused: No    Physically Abused: No    Sexually Abused: No    Allergies   Cephalosporins, Codeine, Macrolides and ketolides, Nitrofurantoin, Penicillins, Tramadol, Doxycycline, and Sulfa antibiotics  Family history:   Family History  Problem Relation Age of Onset   Ovarian cancer Mother    Heart disease Brother    Heart attack Brother    Gout Brother    Alzheimer's disease Brother    Dementia Brother    Heart attack Brother    Diabetes Daughter    Melanoma Daughter    Stroke Son    Breast cancer Neg Hx     Current Medications:   Prior to Admission medications   Medication Sig Start Date End Date Taking? Authorizing Provider  amLODipine (NORVASC) 10 MG tablet Take 1 tablet (10 mg total) by mouth daily. 07/22/22  Yes Gwyneth Sprout, FNP  aspirin EC 81 MG tablet Take 81 mg by mouth daily.    Yes [provider]  atorvastatin (LIPITOR) 40 MG tablet Take 1 tablet (40 mg total) by mouth daily. 07/22/22  Yes Tally Joe T, FNP  cholecalciferol (VITAMIN D) 1000 UNITS tablet Take 1,000 Units by mouth daily.    Yes [provider]  glipiZIDE (GLUCOTROL XL) 10 MG 24 hr tablet Take 1 tablet (10 mg total) by mouth every other day. 07/22/22  Yes Gwyneth Sprout, FNP  hydrochlorothiazide (HYDRODIURIL) 12.5 MG tablet Take 1 tablet (12.5 mg total) by mouth every other day. 07/22/22  Yes Gwyneth Sprout, FNP  OMEGA-3 FATTY ACIDS PO Take 1 capsule by mouth daily.    Yes [provider]  temazepam (RESTORIL) 15 MG capsule Take 1 capsule (15 mg total) by mouth at bedtime as needed for sleep. 08/03/22  Yes Gwyneth Sprout, FNP  traZODone (DESYREL) 50 MG tablet Take  0.5-1 tablets (25-50 mg total) by mouth at bedtime as needed for sleep. 08/03/22  Yes Gwyneth Sprout, FNP    Physical Exam:   Vitals:   02/15/23 1232 02/15/23 1241 02/15/23 1453 02/15/23 1500  BP: (!) 129/118 (!) 148/79 128/67 (!) 143/69  Pulse: 100  83 93  Resp: 15  20 13   Temp: 98.6 F (37 C)     TempSrc: Oral     SpO2: 98%  98% 99%  Weight: 62.1 kg     Height: 5\' 2"  (1.575 m)  Physical Exam: Blood pressure (!) 143/69, pulse 93, temperature 98.6 F (37 C), temperature source Oral, resp. rate 13, height 5\' 2"  (1.575 m), weight 62.1 kg, SpO2 99 %. Gen: No acute distress. Head: Normocephalic, atraumatic. Eyes: Pupils equal, round and reactive to light. Extraocular movements intact.  Sclerae nonicteric.  Mouth: Moist mucous membranes Neck: Supple, no thyromegaly, no lymphadenopathy, no jugular venous distention. Chest: Lungs are clear to auscultation with good air movement. No rales, rhonchi or wheezes.  CV: Heart sounds are regular with an S1, S2.  Systolic murmur loudest in the aortic area, rubs or gallops.  Abdomen: Soft, mild left lower quadrant tenderness, nondistended with normal active bowel sounds. No palpable masses. Extremities: Extremities are without clubbing, or cyanosis. No edema. Pedal pulses 2+.  Skin: Warm and dry. No rashes, lesions or wounds Neuro: Alert and oriented times 3; grossly nonfocal.  Psych: Insight is good and judgment is appropriate. Mood and affect normal.   Data Review:    Labs: Basic Metabolic Panel: Recent Labs  Lab 02/15/23 1244  NA 136  K 3.4*  CL 101  CO2 24  GLUCOSE 259*  BUN 16  CREATININE 0.87  CALCIUM 9.3   Liver Function Tests: Recent Labs  Lab 02/15/23 1244  AST 40  ALT 28  ALKPHOS 66  BILITOT 0.8  PROT 7.3  ALBUMIN 4.0   No results for input(s): "LIPASE", "AMYLASE" in the last 168 hours. No results for input(s): "AMMONIA" in the last 168 hours. CBC: Recent Labs  Lab 02/15/23 1244  WBC 8.9  HGB 10.7*   HCT 33.2*  MCV 92.7  PLT 183   Cardiac Enzymes: No results for input(s): "CKTOTAL", "CKMB", "CKMBINDEX", "TROPONINI" in the last 168 hours.  BNP (last 3 results) No results for input(s): "PROBNP" in the last 8760 hours. CBG: No results for input(s): "GLUCAP" in the last 168 hours.  Urinalysis    Component Value Date/Time   COLORURINE YELLOW 12/15/2014 1926   APPEARANCEUR Clear 06/13/2021 1019   LABSPEC 1.010 12/15/2014 1926   PHURINE 8.0 12/15/2014 1926   GLUCOSEU Negative 06/13/2021 1019   GLUCOSEU NEGATIVE 12/15/2014 1926   HGBUR NEGATIVE 12/15/2014 1926   BILIRUBINUR Negative 06/13/2021 1019   BILIRUBINUR NEGATIVE 12/15/2014 1926   KETONESUR NEGATIVE 12/15/2014 1926   PROTEINUR Trace 06/13/2021 1019   PROTEINUR NEGATIVE 12/15/2014 1926   NITRITE Negative 06/13/2021 1019   NITRITE NEGATIVE 12/15/2014 1926   LEUKOCYTESUR Negative 06/13/2021 1019   LEUKOCYTESUR NEGATIVE 12/15/2014 1926      Radiographic Studies: No results found.  EKG: Independently reviewed by me showed normal sinus rhythm, PACs.    Assessment/Plan:   Principal Problem:   Melena Active Problems:   Moderate aortic stenosis   Acute blood loss anemia   Chronic diastolic CHF (congestive heart failure)   Body mass index is 25.06 kg/m.   Melena, lower abdominal pain the patient with history of colonic polyps and diverticulosis: Admit to MedSurg and monitor on telemetry.  Vital signs are stable.  Obtain CT angiogram of the abdomen and pelvis for further evaluation.  Explained risks and benefits of CT abdomen with contrast.  Keep NPO for now.  IV fluids for hydration.  Treat with IV Protonix.  Consulted Dr. Haig Prophet, gastroenterologist, to assist with management.   Acute anemia, probably from blood loss: Hemoglobin is 10.7.  Hemoglobin was 15.17 months ago.  Check iron studies vitamin B12.  Monitor H&H and transfuse as needed.  No indication for blood transfusion at this time.  Type II DM  with hyperglycemia: Hydrate with IV fluids.  Will hold off on insulin for now because of n.p.o. status.   Mild hypokalemia: Replete with potassium.   Carotid artery stenosis, history of stroke: Hold low-dose aspirin   Chronic diastolic CHF, moderate aortic stenosis: Stable.  2D echo in March 2022 showed moderate AS, EF greater than 55%, moderate LVH, grade 1 diastolic dysfunction   Addendum: Iron studies showed iron level of 33, TIBC 455, iron saturation ratio 7 and ferritin of 15.  This is consistent with iron deficiency anemia.  She will be given IV iron sucrose infusion.  She is agreeable with the plan.    Other information:   DVT prophylaxis: SCD's   Code Status: Full code. Family Communication: Plan discussed with Juliann Pulse, daughter, at the bedside Disposition Plan: Plan to discharge home in 1 to 2 days Consults called: Gastroenterologist Admission status: Observation  The medical decision making on this patient was of high complexity and the patient is at high risk for clinical deterioration, therefore this is a level 3 visit.     Anastasia Tompson [facility] Pager: Please check [email or web address]   How to contact the [facility] Attending or Consulting provider [place] or covering provider during after hours [place], for this patient?   Check the care team in [facility] and look for a) attending/consulting [facility] provider listed and b) the [facility] team listed Log into [email or web address] and use [place]'s universal password to access. If you do not have the password, please contact the hospital operator. Locate the [facility] provider you are looking for under [facility] and page to a number that you can be directly reached. If you still have difficulty reaching the provider, please page the [facility] (Director on Call) for the Hospitalists listed on amion for assistance.  02/15/2023, 3:43 PM

## 2023-02-15 NOTE — ED Provider Notes (Signed)
[facility] Provider Note    Event Date/Time   First MD Initiated Contact with Patient 02/15/23 1413     (approximate)   History   Blood In Stools and Abdominal Pain   HPI  [name] is a 89 y.o. female who comes in with dark stools, blood in her stools for the past 2 weeks.  She reports of abdominal pain intermittently with it but denies any currently.  She reports feeling more fatigue.  Denies any falls hitting her head or any other concerns.  She denies being on any blood thinners.  She denies this happening previously.  She denies any urinary symptoms.  Denies any abdominal pain     Triage Vital Signs: ED Triage Vitals  Enc Vitals Group     BP 02/15/23 1232 (!) 129/118     Pulse Rate 02/15/23 1232 100     Resp 02/15/23 1232 15     Temp 02/15/23 1232 98.6 F (37 C)     Temp Source 02/15/23 1232 Oral     SpO2 02/15/23 1232 98 %     Weight 02/15/23 1232 137 lb (62.1 kg)     Height 02/15/23 1232 5\' 2"  (1.575 m)     Head Circumference --      Peak Flow --      Pain Score 02/15/23 1241 1     Pain Loc --      Pain Edu? --      Excl. in [place]? --     Most recent vital signs: Vitals:   02/15/23 1232 02/15/23 1241  BP: (!) 129/118 (!) 148/79  Pulse: 100   Resp: 15   Temp: 98.6 F (37 C)   SpO2: 98%      General: Awake, no distress.  CV:  Good peripheral perfusion.  Resp:  Normal effort.  Abd:  No distention.  Soft nontender Other:  Brown stool, Hemoccult negative   ED Results / Procedures / Treatments   Labs (all labs ordered are listed, but only abnormal results are displayed) Labs Reviewed  COMPREHENSIVE METABOLIC PANEL - Abnormal; Notable for the following components:      Result Value   Potassium 3.4 (*)    Glucose, Bld 259 (*)    All other components within normal limits  CBC - Abnormal; Notable for the following components:   RBC 3.58 (*)    Hemoglobin 10.7 (*)    HCT 33.2 (*)    All other components within normal  limits  POC OCCULT BLOOD, ED  TYPE AND SCREEN     EKG  My interpretation of EKG:  Normal sinus rate of 69 without any ST elevation or T wave inversions, normal intervals   PROCEDURES:  Critical Care performed: No  .1-3 Lead EKG Interpretation  Performed by: Vanessa Gordon, MD Authorized by: Vanessa Knollwood, MD     Interpretation: normal     ECG rate:  90   ECG rate assessment: normal     Rhythm: sinus rhythm     Ectopy: none     Conduction: normal      MEDICATIONS ORDERED IN ED: Medications  pantoprazole (PROTONIX) injection 40 mg (has no administration in time range)     IMPRESSION / MDM / ASSESSMENT AND PLAN / ED COURSE  I reviewed the triage vital signs and the nursing notes.   Patient's presentation is most consistent with acute presentation with potential threat to life or bodily function.   Patient  comes in with report of black melanotic stools.  I discussed with patient that her stool currently is brown she is adamant that it has been black for her in the past couple weeks.  She is not on any blood thinners but feels more weak and tired.  Her hemoglobin is downtrending from her prior.  Will give a dose of Protonix.  Currently her abdomen is soft and nontender we discussed CT imaging but at this time she denies any bright red blood, she has no tenderness and so we have opted to hold off on CT imaging.  I will give her a dose of Protonix, patient is been typed and screened and will discuss to the hospital team for admission for monitoring for potential GI bleed  CBC shows hemoglobin of 10.7.  White count is normal at 8.9. CMP shows potassium of 3.4 with sugars of 259  I reviewed patient's PCP visit on 09/30/2021 where she seen for chronic back pain, chronic idiopathic constipation, hypertension  The patient is on the cardiac monitor to evaluate for evidence of arrhythmia and/or significant heart rate changes.      FINAL CLINICAL IMPRESSION(S) / ED DIAGNOSES    Final diagnoses:  Anemia, unspecified type     Rx / DC Orders   ED Discharge Orders     None        Note:  This document was prepared using Dragon voice recognition software and may include unintentional dictation errors.   Vanessa Lee, MD 02/15/23 [phone]

## 2023-02-15 NOTE — ED Triage Notes (Signed)
Patient c/o blood in stools X2 weeks. Reports stool has been runny and black 2-3 times a day. C/o abdominal pain and feeling drained

## 2023-02-15 NOTE — Consult Note (Signed)
Consultation  Referring Provider:     Hospitalist Admit date: 02/15/2023 Consult date: 02/15/2023         Reason for Consultation:  Melena            HPI:   [name] is a 89 y.o. lady with history of hypertension, IBS, and HLD here for melenic appearing stools for two weeks. Patient describes loose, tarry bowel movements twice daily for two weeks. She takes aspirin 81 mg daily and over the past week, an unknown NSAID for back pain. No other blood thinners. Last colonoscopy in 2010 with three small Ta's. History of cholecystectomy. Some chronic lower abdominal pain. States she feels more fatigued. Iron studies consistent with iron deficiency anemia. She doesn't take pepto-bismol or iron supplementation.   Past Medical History:  Diagnosis Date   Acid reflux 08/05/2015   Cancer    skin   Colon polyp 08/05/2015   DD (diverticular disease) 08/05/2015   Diabetes mellitus without complication    type 2   Eczema of both upper extremities 06/10/2021   Hyperlipidemia    Hypertension    IBS (irritable bowel syndrome)    Irritable bowel syndrome with diarrhea 04/20/2017   LBP (low back pain) 08/05/2015   Melanoma     Past Surgical History:  Procedure Laterality Date   BREAST BIOPSY Right    neg   BREAST SURGERY     1980's   CAROTID ARTERY ANGIOPLASTY Left 01/18/2015   Dr. Delana Meyer [facility]   CHOLECYSTECTOMY  06/27/2012   DECOMPRESSIVE LUMBAR LAMINECTOMY LEVEL 1  1950   EYE SURGERY Right 2012   cataract extraction   SKIN SURGERY      Family History  Problem Relation Age of Onset   Ovarian cancer Mother    Heart disease Brother    Heart attack Brother    Gout Brother    Alzheimer's disease Brother    Dementia Brother    Heart attack Brother    Diabetes Daughter    Melanoma Daughter    Stroke Son    Breast cancer Neg Hx     Social History   Tobacco Use   Smoking status: Former    Packs/day: .5    Types: Cigarettes    Quit date: 11/15/1976    Years since quitting: 46.2    Smokeless tobacco: Never  Vaping Use   Vaping Use: Never used  Substance Use Topics   Alcohol use: No   Drug use: No    Prior to Admission medications   Medication Sig Start Date End Date Taking? Authorizing Provider  amLODipine (NORVASC) 10 MG tablet Take 1 tablet (10 mg total) by mouth daily. 07/22/22  Yes Gwyneth Sprout, FNP  aspirin EC 81 MG tablet Take 81 mg by mouth daily.    Yes [provider]  atorvastatin (LIPITOR) 40 MG tablet Take 1 tablet (40 mg total) by mouth daily. 07/22/22  Yes Tally Joe T, FNP  cholecalciferol (VITAMIN D) 1000 UNITS tablet Take 1,000 Units by mouth daily.    Yes [provider]  glipiZIDE (GLUCOTROL XL) 10 MG 24 hr tablet Take 1 tablet (10 mg total) by mouth every other day. 07/22/22  Yes Gwyneth Sprout, FNP  hydrochlorothiazide (HYDRODIURIL) 12.5 MG tablet Take 1 tablet (12.5 mg total) by mouth every other day. 07/22/22  Yes Gwyneth Sprout, FNP  OMEGA-3 FATTY ACIDS PO Take 1 capsule by mouth daily.    Yes [provider]  temazepam (RESTORIL) 15 MG  capsule Take 1 capsule (15 mg total) by mouth at bedtime as needed for sleep. 08/03/22  Yes Gwyneth Sprout, FNP  traZODone (DESYREL) 50 MG tablet Take 0.5-1 tablets (25-50 mg total) by mouth at bedtime as needed for sleep. 08/03/22  Yes Gwyneth Sprout, FNP    Current Facility-Administered Medications  Medication Dose Route Frequency Provider Last Rate Last Admin   lactated ringers infusion   Intravenous Continuous Jennye Boroughs, MD       Derrill Memo ON 02/16/2023] pantoprazole (PROTONIX) injection 40 mg  40 mg Intravenous Q12H Jennye Boroughs, MD       potassium chloride SA (KLOR-CON M) CR tablet 40 mEq  40 mEq Oral Once Jennye Boroughs, MD       Current Outpatient Medications  Medication Sig Dispense Refill   amLODipine (NORVASC) 10 MG tablet Take 1 tablet (10 mg total) by mouth daily. 90 tablet 3   aspirin EC 81 MG tablet Take 81 mg by mouth daily.      atorvastatin (LIPITOR) 40 MG tablet  Take 1 tablet (40 mg total) by mouth daily. 90 tablet 3   cholecalciferol (VITAMIN D) 1000 UNITS tablet Take 1,000 Units by mouth daily.      glipiZIDE (GLUCOTROL XL) 10 MG 24 hr tablet Take 1 tablet (10 mg total) by mouth every other day. 45 tablet 3   hydrochlorothiazide (HYDRODIURIL) 12.5 MG tablet Take 1 tablet (12.5 mg total) by mouth every other day. 90 tablet 3   OMEGA-3 FATTY ACIDS PO Take 1 capsule by mouth daily.      temazepam (RESTORIL) 15 MG capsule Take 1 capsule (15 mg total) by mouth at bedtime as needed for sleep. 90 capsule 3   traZODone (DESYREL) 50 MG tablet Take 0.5-1 tablets (25-50 mg total) by mouth at bedtime as needed for sleep. 90 tablet 3    Allergies as of 02/15/2023 - Review Complete 02/15/2023  Allergen Reaction Noted   Cephalosporins  08/05/2015   Codeine  08/05/2015   Macrolides and ketolides  08/05/2015   Nitrofurantoin  02/13/2020   Penicillins  08/05/2015   Tramadol Nausea Only 08/05/2015   Doxycycline Rash 10/18/2017   Sulfa antibiotics Rash 08/05/2015     Review of Systems:    All systems reviewed and negative except where noted in HPI.  Review of Systems  Constitutional:  Negative for chills and fever.  Respiratory:  Negative for shortness of breath.   Cardiovascular:  Negative for chest pain.  Gastrointestinal:  Positive for diarrhea and melena. Negative for abdominal pain, nausea and vomiting.  Skin:  Negative for rash.  Neurological:  Negative for focal weakness.  Psychiatric/Behavioral:  Negative for substance abuse.   All other systems reviewed and are negative.      Physical Exam:  Vital signs in last 24 hours: Temp:  [98.6 F (37 C)] 98.6 F (37 C) (04/01 1232) Pulse Rate:  [83-100] 93 (04/01 1500) Resp:  [13-20] 13 (04/01 1500) BP: (128-148)/(67-118) 143/69 (04/01 1500) SpO2:  [98 %-99 %] 99 % (04/01 1500) Weight:  [62.1 kg] 62.1 kg (04/01 1232)   General:   Pleasant in NAD Head:  Normocephalic and atraumatic. Eyes:   No  icterus.   Conjunctiva pink. Mouth: Mucosa pink moist, no lesions. Neck:  Supple; no masses felt Lungs:  No respiratory distress Abdomen:   Flat, soft, nondistended, nontender Rectal:  Brown stool on rectal Msk:  No clubbing or cyanosis. Neurologic:  Alert and  oriented x4 Skin:  Warm, dry, pink without  significant lesions or rashes. Psych:  Alert and cooperative. Normal affect.  LAB RESULTS: Recent Labs    02/15/23 1244  WBC 8.9  HGB 10.7*  HCT 33.2*  PLT 183   BMET Recent Labs    02/15/23 1244  NA 136  K 3.4*  CL 101  CO2 24  GLUCOSE 259*  BUN 16  CREATININE 0.87  CALCIUM 9.3   LFT Recent Labs    02/15/23 1244  PROT 7.3  ALBUMIN 4.0  AST 40  ALT 28  ALKPHOS 66  BILITOT 0.8   PT/INR No results for input(s): "LABPROT", "INR" in the last 72 hours.  STUDIES: No results found.     Impression / Plan:   89 y/o lady with history of hypertension, IBS, and HLD here for melenic appearing stools for two weeks. New IDA.  - will plan on EGD tomorrow - clear liquids today, NPO at midnight - PPI IV BID - maintain active type and screen - transfuse for hemoglobin < 7 - two large bore IV's - avoid heparin products - further recs after procedure tomorrow  Raylene Miyamoto MD, MPH [place]

## 2023-02-15 NOTE — ED Notes (Signed)
Patient transported to CT 

## 2023-02-15 NOTE — ED Notes (Signed)
First Nurse Note: Pt to ED via [facility] for dark bloody stool for the last 2 weeks. Pt reports increased weakness.

## 2023-02-15 NOTE — Progress Notes (Signed)
       CROSS COVER NOTE  NAME: CHAMPAYNE LEI MRN: [id number] DOB : Apr 10, 1929 ATTENDING PHYSICIAN: Jennye Boroughs, MD    Date of Service   02/15/2023   HPI/Events of Note   Report/Request Patient is her for a GI bleed, she is requesting her sleep aid. Medical records state she take 25-50mg  of trazadone at home. can I get an order?   Interventions   Assessment/Plan:  Home Trazodone      To reach the provider On-Call:   7AM- 7PM see care teams to locate the attending and reach out to them via [email or web address] Password: [email or web address] 7PM-7AM contact night-coverage If you still have difficulty reaching the appropriate provider, please page the [facility] (Director on Call) for [facility] on amion for assistance  This document was prepared using Systems analyst and may include unintentional dictation errors.  Neomia Glass DNP, MBA, FNP-BC, PMHNP-BC Nurse Practitioner [facility] [place] Pager [phone]

## 2023-02-16 ENCOUNTER — Encounter: Payer: Self-pay | Admitting: Internal Medicine

## 2023-02-16 ENCOUNTER — Observation Stay: Payer: Medicare Other | Admitting: Anesthesiology

## 2023-02-16 ENCOUNTER — Other Ambulatory Visit: Payer: Self-pay

## 2023-02-16 ENCOUNTER — Encounter
Admission: EM | Disposition: A | Discharge status: Home / Self Care | Payer: Self-pay | Source: Home / Self Care | Attending: Internal Medicine

## 2023-02-16 DIAGNOSIS — K921 Melena: Secondary | ICD-10-CM | POA: Diagnosis not present

## 2023-02-16 DIAGNOSIS — K2970 Gastritis, unspecified, without bleeding: Secondary | ICD-10-CM | POA: Diagnosis not present

## 2023-02-16 DIAGNOSIS — I110 Hypertensive heart disease with heart failure: Secondary | ICD-10-CM | POA: Diagnosis not present

## 2023-02-16 DIAGNOSIS — I509 Heart failure, unspecified: Secondary | ICD-10-CM | POA: Diagnosis not present

## 2023-02-16 DIAGNOSIS — D62 Acute posthemorrhagic anemia: Secondary | ICD-10-CM | POA: Diagnosis not present

## 2023-02-16 HISTORY — PX: ESOPHAGOGASTRODUODENOSCOPY (EGD) WITH PROPOFOL: SHX5813

## 2023-02-16 LAB — BASIC METABOLIC PANEL
Anion gap: 8 (ref 5–15)
BUN: 11 mg/dL (ref 8–23)
CO2: 24 mmol/L (ref 22–32)
Calcium: 9 mg/dL (ref 8.9–10.3)
Chloride: 106 mmol/L (ref 98–111)
Creatinine, Ser: 0.69 mg/dL (ref 0.44–1.00)
GFR, Estimated: >60 mL/min (ref >60)
Glucose, Bld: 162 mg/dL — ABNORMAL HIGH (ref 70–99)
Potassium: 3.5 mmol/L (ref 3.5–5.1)
Sodium: 138 mmol/L (ref 135–145)

## 2023-02-16 LAB — CBC WITH DIFFERENTIAL/PLATELET
Abs Immature Granulocytes: 0.01 10*3/uL (ref 0.00–0.07)
Basophils Absolute: 0 10*3/uL (ref 0.0–0.1)
Basophils Relative: 1 %
Eosinophils Absolute: 0.1 10*3/uL (ref 0.0–0.5)
Eosinophils Relative: 2 %
HCT: 29.7 % — ABNORMAL LOW (ref 36.0–46.0)
Hemoglobin: 9.8 g/dL — ABNORMAL LOW (ref 12.0–15.0)
Immature Granulocytes: 0 %
Lymphocytes Relative: 36 %
Lymphs Abs: 2.3 10*3/uL (ref 0.7–4.0)
MCH: 30.5 pg (ref 26.0–34.0)
MCHC: 33 g/dL (ref 30.0–36.0)
MCV: 92.5 fL (ref 80.0–100.0)
Monocytes Absolute: 0.5 10*3/uL (ref 0.1–1.0)
Monocytes Relative: 7 %
Neutro Abs: 3.5 10*3/uL (ref 1.7–7.7)
Neutrophils Relative %: 54 %
Platelets: 157 10*3/uL (ref 150–400)
RBC: 3.21 MIL/uL — ABNORMAL LOW (ref 3.87–5.11)
RDW: 14.2 % (ref 11.5–15.5)
WBC: 6.4 10*3/uL (ref 4.0–10.5)
nRBC: 0 % (ref 0.0–0.2)

## 2023-02-16 SURGERY — ESOPHAGOGASTRODUODENOSCOPY (EGD) WITH PROPOFOL
Anesthesia: General

## 2023-02-16 MED ORDER — LIDOCAINE HCL (CARDIAC) PF 100 MG/5ML IV SOSY
PREFILLED_SYRINGE | INTRAVENOUS | Status: DC | PRN
  Administered 2023-02-16: 30 mg via INTRAVENOUS

## 2023-02-16 MED ORDER — PROPOFOL 10 MG/ML IV BOLUS
INTRAVENOUS | Status: AC
  Filled 2023-02-16: qty 20

## 2023-02-16 MED ORDER — KCL-LACTATED RINGERS 20 MEQ/L IV SOLN
INTRAVENOUS | Status: DC
  Filled 2023-02-16: qty 1000

## 2023-02-16 MED ORDER — TRAZODONE HCL 50 MG PO TABS
25.0000 mg | ORAL_TABLET | Freq: Once | ORAL | Status: AC
  Administered 2023-02-16: 25 mg via ORAL
  Filled 2023-02-16: qty 1

## 2023-02-16 MED ORDER — LIDOCAINE HCL (PF) 2 % IJ SOLN
INTRAMUSCULAR | Status: AC
  Filled 2023-02-16: qty 5

## 2023-02-16 MED ORDER — ORAL CARE MOUTH RINSE: 15.0000 mL | OROMUCOSAL | Status: DC | PRN

## 2023-02-16 MED ORDER — PROPOFOL 10 MG/ML IV BOLUS
INTRAVENOUS | Status: DC | PRN
  Administered 2023-02-16: 30 mg via INTRAVENOUS
  Administered 2023-02-16: 20 mg via INTRAVENOUS
  Administered 2023-02-16: 30 mg via INTRAVENOUS

## 2023-02-16 MED ORDER — PEG 3350-KCL-NA BICARB-NACL 420 G PO SOLR
4000.0000 mL | Freq: Once | ORAL | Status: AC
  Administered 2023-02-16: 4000 mL via ORAL
  Filled 2023-02-16: qty 4000

## 2023-02-16 MED ORDER — POTASSIUM CHLORIDE 2 MEQ/ML IV SOLN
INTRAVENOUS | Status: AC
  Filled 2023-02-16: qty 1000

## 2023-02-16 MED ORDER — STERILE WATER FOR INJECTION IJ SOLN
INTRAMUSCULAR | Status: AC
  Administered 2023-02-16: 10 mL via INTRAVENOUS
  Filled 2023-02-16: qty 10

## 2023-02-16 MED ORDER — SODIUM CHLORIDE 0.9 % IV SOLN
200.0000 mg | Freq: Once | INTRAVENOUS | Status: AC
  Administered 2023-02-16: 200 mg via INTRAVENOUS
  Filled 2023-02-16: qty 200

## 2023-02-16 MED ORDER — SODIUM CHLORIDE 0.9 % IV SOLN: INTRAVENOUS | Status: DC

## 2023-02-16 NOTE — Op Note (Signed)
[facility]astroenterology Patient Name: [name] Procedure Date: 02/16/2023 11:45 AM MRN: [id number] Account #: [id number] Date of Birth: 1929-10-27 Admit Type: Inpatient Age: 89 Room: [facility] ENDO ROOM 1 Gender: Female Note Status: Finalized Instrument Name: Upper Endoscope [id number] Procedure:             Upper GI endoscopy Indications:           Melena Providers:             Andrey Farmer MD, MD Referring MD:          Jaci Standard. Rollene Rotunda (Referring MD) Medicines:             Monitored Anesthesia Care Complications:         No immediate complications. Estimated blood loss:                         Minimal. Procedure:             Pre-Anesthesia Assessment:                        - Prior to the procedure, a History and Physical was                         performed, and patient medications and allergies were                         reviewed. The patient is competent. The risks and                         benefits of the procedure and the sedation options and                         risks were discussed with the patient. All questions                         were answered and informed consent was obtained.                         Patient identification and proposed procedure were                         verified by the physician, the nurse, the                         anesthesiologist, the anesthetist and the technician                         in the endoscopy suite. Mental Status Examination:                         alert and oriented. Airway Examination: normal                         oropharyngeal airway and neck mobility. Respiratory                         Examination: clear to auscultation. CV Examination:  normal. Prophylactic Antibiotics: The patient does not                         require prophylactic antibiotics. Prior                         Anticoagulants: The patient has taken no anticoagulant                         or  antiplatelet agents except for aspirin. ASA Grade                         Assessment: III - A patient with severe systemic                         disease. After reviewing the risks and benefits, the                         patient was deemed in satisfactory condition to                         undergo the procedure. The anesthesia plan was to use                         monitored anesthesia care (MAC). Immediately prior to                         administration of medications, the patient was                         re-assessed for adequacy to receive sedatives. The                         heart rate, respiratory rate, oxygen saturations,                         blood pressure, adequacy of pulmonary ventilation, and                         response to care were monitored throughout the                         procedure. The physical status of the patient was                         re-assessed after the procedure.                        After obtaining informed consent, the endoscope was                         passed under direct vision. Throughout the procedure,                         the patient's blood pressure, pulse, and oxygen                         saturations were monitored continuously. The Endoscope  was introduced through the mouth, and advanced to the                         second part of duodenum. The upper GI endoscopy was                         accomplished without difficulty. The patient tolerated                         the procedure well. Findings:      The examined esophagus was normal.      Diffuse moderate inflammation characterized by erythema was found in the       gastric antrum. Biopsies were taken with a cold forceps for Helicobacter       pylori testing. Estimated blood loss was minimal.      The examined duodenum was normal. Impression:            - Normal esophagus.                        - Gastritis. Biopsied.                         - Normal examined duodenum. Recommendation:        - Return patient to hospital ward for ongoing care.                        - Clear liquid diet.                        - Continue present medications.                        - Await pathology results.                        - Will discuss with patient and family if agreeable to                         colonoscopy. Alternatives include using PO iron. Procedure Code(s):     --- Professional ---                        [phone], Esophagogastroduodenoscopy, flexible,                         transoral; with biopsy, single or multiple Diagnosis Code(s):     --- Professional ---                        K29.70, Gastritis, unspecified, without bleeding                        K92.1, Melena (includes Hematochezia) CPT copyright 2022 American Medical Association. All rights reserved. The codes documented in this report are preliminary and upon coder review may  be revised to meet current compliance requirements. Andrey Farmer MD, MD 02/16/2023 12:04:32 PM Number of Addenda: 0 Note Initiated On: 02/16/2023 11:45 AM Estimated Blood Loss:  Estimated blood loss was minimal.      [facility]

## 2023-02-16 NOTE — Progress Notes (Signed)
Progress Note    [name]  [id number] DOB: 23-Dec-1928  DOA: 02/15/2023 PCP: Gwyneth Sprout, FNP      Brief Narrative:    Medical records reviewed and are as summarized below:  [name] is a 89 y.o. female [name] is a 89 y.o. female with medical history significant for moderate aortic stenosis, stroke on low-dose aspirin, type 2 diabetes mellitus, diverticulosis, colonic polyps, hyperlipidemia, hypertension, IBS, chronic low back pain, GERD, melanoma, who presented to the hospital with 2 weeks duration of black stools (melena).  She states she has been having intermittent black stools for 2 weeks.  She also complained of lower abdominal pain of about 2 weeks duration.  She has had back pain for a few weeks.  She does not think she has chronic low back pain but chart review shows that she has a medical history of low back pain.  She has presents dizziness and easy fatigability in the last 2 days preceding admission.  No fever, chills, vomiting, hematemesis, upper abdominal pain, urinary symptoms, chest pain, shortness of breath or palpitations.  She has been taking low-dose aspirin for years.       Assessment/Plan:   Principal Problem:   Melena Active Problems:   Moderate aortic stenosis   Acute blood loss anemia   Chronic diastolic CHF (congestive heart failure)    Body mass index is 25.06 kg/m.    Melena, lower abdominal pain the patient with history of colonic polyps and diverticulosis: CT angiogram of the abdomen pelvis did not show any evidence of active GI bleeding without evidence of diffuse diverticulosis.  S/p EGD on 02/16/2023 which showed gastritis.  Plan for colonoscopy tomorrow.  Follow-up with gastroenterologist. Continue IV Protonix.  Continue IV fluids but decrease rate from 75 to 50 mL/h.  She will require bowel prep for colonoscopy.     Acute blood loss anemia: Hemoglobin was 9.8 today, down from 10.7 on admission..  Hemoglobin was 15.1   7 months ago.   Iron studies showed iron level of 33, TIBC 455, iron saturation ratio 7 and ferritin of 15.  This is consistent with iron deficiency anemia. S/p IV iron sucrose on 02/15/2023.  Repeat IV iron sucrose 200 mg x 1 dose today.  Repeat CBC tomorrow.     Type II DM with hyperglycemia: No sliding scale for now since patient is NPO.    Mild hypokalemia: Improved.  Continue potassium repletion.     Carotid artery stenosis, history of stroke: Low-dose aspirin is still on hold.     Chronic diastolic CHF, moderate aortic stenosis: Stable.  2D echo in March 2022 showed moderate AS, EF greater than 55%, moderate LVH, grade 1 diastolic dysfunction     Diet Order             Diet NPO time specified  Diet effective now                            Consultants: Gastroenterologist  Procedures: EGD    Medications:    amLODipine  10 mg Oral Daily   pantoprazole (PROTONIX) IV  40 mg Intravenous Q12H   Continuous Infusions:  iron sucrose     lactated ringers with KCl 20 mEq/L       Anti-infectives (From admission, onward)    None              Family Communication/Anticipated D/C date and  plan/Code Status   DVT prophylaxis: SCDs Start: 02/15/23 1715 Place and maintain sequential compression device Start: 02/15/23 1544     Code Status: Full Code  Family Communication: Plan discussed with her daughter at the bedside Disposition Plan: Plan to discharge home tomorrow   Status is: Observation The patient will require care spanning > 2 midnights and should be moved to inpatient because: Melena s/p EGD and request colonoscopy tomorrow       Subjective:   Events noted.  She is still having black stools.  No abdominal pain or hematemesis, shortness of breath, chest pain or dizziness.  Fatigue and general weakness are a little better today.   Objective:    Vitals:   02/16/23 1137 02/16/23 1202 02/16/23 1212 02/16/23 1222  BP: (!) 141/75  (!) 100/55 114/68 123/70  Pulse: 96 90 88 82  Resp: 18 18 17 15   Temp: (!) 97.3 F (36.3 C)  (!) 96.7 F (35.9 C)   TempSrc:   Temporal   SpO2: 97% 97% 97% 100%  Weight:      Height:       No data found.   Intake/Output Summary (Last 24 hours) at 02/16/2023 1414 Last data filed at 02/16/2023 0700 Gross per 24 hour  Intake 1137.89 ml  Output --  Net 1137.89 ml   Filed Weights   02/15/23 1232  Weight: 62.1 kg    Exam:  GEN: NAD SKIN: No rash EYES: EOMI ENT: MMM CV: RRR PULM: CTA B ABD: soft, obese, NT, +BS CNS: AAO x 3, non focal EXT: No edema or tenderness        Data Reviewed:   I have personally reviewed following labs and imaging studies:  Labs: Labs show the following:   Basic Metabolic Panel: Recent Labs  Lab 02/15/23 1244 02/16/23 0542  NA 136 138  K 3.4* 3.5  CL 101 106  CO2 24 24  GLUCOSE 259* 162*  BUN 16 11  CREATININE 0.87 0.69  CALCIUM 9.3 9.0   GFR Estimated Creatinine Clearance: 38.9 mL/min (by C-G formula based on SCr of 0.69 mg/dL). Liver Function Tests: Recent Labs  Lab 02/15/23 1244  AST 40  ALT 28  ALKPHOS 66  BILITOT 0.8  PROT 7.3  ALBUMIN 4.0   No results for input(s): "LIPASE", "AMYLASE" in the last 168 hours. No results for input(s): "AMMONIA" in the last 168 hours. Coagulation profile No results for input(s): "INR", "PROTIME" in the last 168 hours.  CBC: Recent Labs  Lab 02/15/23 1244 02/15/23 1626 02/16/23 0542  WBC 8.9  --  6.4  NEUTROABS  --   --  3.5  HGB 10.7* 10.4* 9.8*  HCT 33.2* 32.9* 29.7*  MCV 92.7  --  92.5  PLT 183  --  157   Cardiac Enzymes: No results for input(s): "CKTOTAL", "CKMB", "CKMBINDEX", "TROPONINI" in the last 168 hours. BNP (last 3 results) No results for input(s): "PROBNP" in the last 8760 hours. CBG: No results for input(s): "GLUCAP" in the last 168 hours. D-Dimer: No results for input(s): "DDIMER" in the last 72 hours. Hgb A1c: No results for input(s): "HGBA1C" in the  last 72 hours. Lipid Profile: No results for input(s): "CHOL", "HDL", "LDLCALC", "TRIG", "CHOLHDL", "LDLDIRECT" in the last 72 hours. Thyroid function studies: No results for input(s): "TSH", "T4TOTAL", "T3FREE", "THYROIDAB" in the last 72 hours.  Invalid input(s): "FREET3" Anemia work up: Recent Labs    02/15/23 1244 02/15/23 1600  VITAMINB12  --  259  FERRITIN 15  --  TIBC 455*  --   IRON 33  --    Sepsis Labs: Recent Labs  Lab 02/15/23 1244 02/16/23 0542  WBC 8.9 6.4    Microbiology No results found for this or any previous visit (from the past 240 hour(s)).  Procedures and diagnostic studies:  CT ANGIO GI BLEED  Result Date: 02/15/2023 CLINICAL DATA:  Melena EXAM: CTA ABDOMEN AND PELVIS WITHOUT AND WITH CONTRAST TECHNIQUE: Multidetector CT imaging of the abdomen and pelvis was performed using the standard protocol during bolus administration of intravenous contrast. Multiplanar reconstructed images and MIPs were obtained and reviewed to evaluate the vascular anatomy. RADIATION DOSE REDUCTION: This exam was performed according to the departmental dose-optimization program which includes automated exposure control, adjustment of the mA and/or kV according to patient size and/or use of iterative reconstruction technique. CONTRAST:  100 mL OMNIPAQUE IOHEXOL 350 MG/ML SOLN COMPARISON:  09/16/2013 FINDINGS: VASCULAR Aorta: No significant aneurysm. Dense atheromatous calcifications throughout the aorta and its branches. Celiac: Patent without evidence of aneurysm, dissection, vasculitis or significant stenosis. SMA: Patent without evidence of aneurysm, dissection, vasculitis or significant stenosis. Renals: Both renal arteries are patent without evidence of aneurysm, dissection, vasculitis, fibromuscular dysplasia or significant stenosis. IMA: Patent without evidence of aneurysm, dissection, vasculitis or significant stenosis. Inflow: Patent without evidence of aneurysm, dissection,  vasculitis or significant stenosis. Proximal Outflow: Bilateral common femoral and visualized portions of the superficial and profunda femoral arteries are patent without evidence of aneurysm, dissection, vasculitis or significant stenosis. Veins: No obvious venous abnormality within the limitations of this arterial phase study. Review of the MIP images confirms the above findings. NON-VASCULAR Lower chest: No acute abnormality. Aortic and mitral calcifications. Hepatobiliary: No focal liver abnormality is seen. Status post cholecystectomy. No biliary dilatation. Pancreas: Unremarkable. No pancreatic ductal dilatation or surrounding inflammatory changes. Spleen: Normal in size without focal abnormality. Adrenals/Urinary Tract: Upper pole cyst 1.7 cm. No adrenal lesions. No hydronephrosis or stones identified. Unremarkable urinary bladder. Stomach/Bowel: Unremarkable appearance of the stomach. No bowel dilatation to suggest obstruction. No evidence of mucosal inflammatory changes. There is diffuse colonic diverticulosis. There was no evidence of extravasated IV contrast on the delayed images to suggest active GI hemorrhage at the time of contrast administration. Right lower quadrant bowel anastomosis noted. Lymphatic: Extensive atheromatous changes. Mid abdominal aorta is mildly ectatic, 2.4 cm. No enlarged abdominal or pelvic lymph nodes. Reproductive: Uterus and bilateral adnexa are unremarkable. Other: No abdominal wall hernia or abnormality. No abdominopelvic ascites. Musculoskeletal: No acute or significant osseous findings. IMPRESSION: VASCULAR Extensive atheromatous calcifications mildly ectatic mid abdominal aorta with no evidence of aneurysm, occlusion or dissection. NON-VASCULAR Diffuse diverticulosis. No extravasated contrast was identified to suggest active GI bleed during contrast administration. Electronically Signed   By: Sammie Bench M.D.   On: 02/15/2023 16:47               LOS: 0  days   Janese Radabaugh  [facility]   Pager on [email or web address]. If 7PM-7AM, please contact night-coverage at [email or web address]     02/16/2023, 2:14 PM

## 2023-02-16 NOTE — Anesthesia Postprocedure Evaluation (Signed)
Anesthesia Post Note  Patient: [name]  Procedure(s) Performed: ESOPHAGOGASTRODUODENOSCOPY (EGD) WITH PROPOFOL  Patient location during evaluation: Endoscopy Anesthesia Type: General Level of consciousness: awake and alert Pain management: pain level controlled Vital Signs Assessment: post-procedure vital signs reviewed and stable Respiratory status: spontaneous breathing, nonlabored ventilation, respiratory function stable and patient connected to nasal cannula oxygen Cardiovascular status: blood pressure returned to baseline and stable Postop Assessment: no apparent nausea or vomiting Anesthetic complications: no   There were no known notable events for this encounter.   Last Vitals:  Vitals:   02/16/23 1202 02/16/23 1212  BP: (!) 100/55 114/68  Pulse: 90 88  Resp: 18 17  Temp:    SpO2: 97% 97%    Last Pain:  Vitals:   02/16/23 1137  TempSrc:   PainSc: 0-No pain                 Ilene Qua

## 2023-02-16 NOTE — Care Management Obs Status (Signed)
[place] NOTIFICATION   Patient Details  Name: [name] MRN: [id number] Date of Birth: Aug 28, 1929   Medicare Observation Status Notification Given:  Yes    Candie Chroman, LCSW 02/16/2023, 2:53 PM

## 2023-02-16 NOTE — Anesthesia Preprocedure Evaluation (Signed)
Anesthesia Evaluation  Patient identified by MRN, date of birth, ID band Patient awake    Reviewed: Allergy & Precautions, NPO status , Patient's Chart, lab work & pertinent test results  History of Anesthesia Complications Negative for: history of anesthetic complications  Airway Mallampati: II  TM Distance: >3 FB Neck ROM: full    Dental no notable dental hx.    Pulmonary neg pulmonary ROS, former smoker   Pulmonary exam normal        Cardiovascular hypertension, + Peripheral Vascular Disease and +CHF  Normal cardiovascular exam+ Valvular Problems/Murmurs AS   ECHO  NORMAL LEFT VENTRICULAR SYSTOLIC FUNCTION   WITH MODERATE LVH  NORMAL RIGHT VENTRICULAR SYSTOLIC FUNCTION  MILD VALVULAR REGURGITATION (See above)  MODERATE VALVULAR STENOSIS (See above)  AVA(VTI)= .99cm^2  MODERATE AS  TRIVIAL TR  MILD AR, MR  EF >55%  Closest EF: >55% (Estimated)  Aortic: MILD AR  Mitral: MILD MR  Tricuspid: TRIVIAL TR  LVH: MODERATE LVH  Dias.FxClass: (Grade 1) relaxation abnormal, E/A reversal  AVS: MODERATE AS     Neuro/Psych CVA  negative psych ROS   GI/Hepatic Neg liver ROS,GERD  Medicated,,Diverticulosis, colon polyps, melena      Endo/Other  negative endocrine ROSdiabetes    Renal/GU negative Renal ROS  negative genitourinary   Musculoskeletal   Abdominal   Peds  Hematology  (+) Blood dyscrasia, anemia   Anesthesia Other Findings Past Medical History: 08/05/2015: Acid reflux No date: Cancer     Comment:  skin 08/05/2015: Colon polyp 08/05/2015: DD (diverticular disease) No date: Diabetes mellitus without complication     Comment:  type 2 06/10/2021: Eczema of both upper extremities No date: Hyperlipidemia No date: Hypertension No date: IBS (irritable bowel syndrome) 04/20/2017: Irritable bowel syndrome with diarrhea 08/05/2015: LBP (low back pain) No date: Melanoma  Past Surgical History: No date: BREAST  BIOPSY; Right     Comment:  neg No date: BREAST SURGERY     Comment:  1980's 01/18/2015: CAROTID ARTERY ANGIOPLASTY; Left     Comment:  Dr. Delana Meyer [facility] 06/27/2012: CHOLECYSTECTOMY 1950: [place] LEVEL 1 2012: EYE SURGERY; Right     Comment:  cataract extraction No date: SKIN SURGERY  BMI    Body Mass Index: 25.06 kg/m      Reproductive/Obstetrics negative OB ROS                              Anesthesia Physical Anesthesia Plan  ASA: 3  Anesthesia Plan: General   Post-op Pain Management:    Induction: Intravenous  PONV Risk Score and Plan: Propofol infusion and TIVA  Airway Management Planned: Natural Airway and Nasal Cannula  Additional Equipment:   Intra-op Plan:   Post-operative Plan:   Informed Consent: I have reviewed the patients History and Physical, chart, labs and discussed the procedure including the risks, benefits and alternatives for the proposed anesthesia with the patient or authorized representative who has indicated his/her understanding and acceptance.     Dental Advisory Given  Plan Discussed with: Anesthesiologist, CRNA and Surgeon  Anesthesia Plan Comments: (Patient consented for risks of anesthesia including but not limited to:  - adverse reactions to medications - risk of airway placement if required - damage to eyes, teeth, lips or other oral mucosa - nerve damage due to positioning  - sore throat or hoarseness - Damage to heart, brain, nerves, lungs, other parts of body or loss of life  Patient voiced  understanding.)         Anesthesia Quick Evaluation

## 2023-02-16 NOTE — Care Plan (Signed)
Patient EGD was unremarkable. Patient extremely concerned for a malignancy. Will pursue colonoscopy tomorrow.  - clear liquids - NPO at 8 am tomorrow - will order golytely prep - further recs after procedure tomorrow  Raylene Miyamoto MD, MPH [place]

## 2023-02-16 NOTE — Transfer of Care (Signed)
Immediate Anesthesia Transfer of Care Note  Patient: [name]  Procedure(s) Performed: ESOPHAGOGASTRODUODENOSCOPY (EGD) WITH PROPOFOL  Patient Location: PACU  Anesthesia Type:General  Level of Consciousness: awake and sedated  Airway & Oxygen Therapy: Patient Spontanous Breathing and Patient connected to nasal cannula oxygen  Post-op Assessment: Report given to RN and Post -op Vital signs reviewed and stable  Post vital signs: Reviewed and stable  Last Vitals:  Vitals Value Taken Time  BP 100/55 02/16/23 1202  Temp    Pulse 90 02/16/23 1202  Resp 18 02/16/23 1202  SpO2 97 % 02/16/23 1202    Last Pain:  Vitals:   02/16/23 1137  TempSrc:   PainSc: 0-No pain         Complications: There were no known notable events for this encounter.

## 2023-02-17 ENCOUNTER — Inpatient Hospital Stay: Payer: Medicare Other | Admitting: Anesthesiology

## 2023-02-17 ENCOUNTER — Encounter
Admission: EM | Disposition: A | Discharge status: Home / Self Care | Payer: Self-pay | Source: Home / Self Care | Attending: Internal Medicine

## 2023-02-17 ENCOUNTER — Encounter: Payer: Self-pay | Admitting: Gastroenterology

## 2023-02-17 DIAGNOSIS — K921 Melena: Secondary | ICD-10-CM | POA: Diagnosis present

## 2023-02-17 DIAGNOSIS — Z8673 Personal history of transient ischemic attack (TIA), and cerebral infarction without residual deficits: Secondary | ICD-10-CM | POA: Diagnosis not present

## 2023-02-17 DIAGNOSIS — I350 Nonrheumatic aortic (valve) stenosis: Secondary | ICD-10-CM | POA: Diagnosis present

## 2023-02-17 DIAGNOSIS — Z8041 Family history of malignant neoplasm of ovary: Secondary | ICD-10-CM | POA: Diagnosis not present

## 2023-02-17 DIAGNOSIS — E1169 Type 2 diabetes mellitus with other specified complication: Secondary | ICD-10-CM

## 2023-02-17 DIAGNOSIS — Z7982 Long term (current) use of aspirin: Secondary | ICD-10-CM | POA: Diagnosis not present

## 2023-02-17 DIAGNOSIS — Z85828 Personal history of other malignant neoplasm of skin: Secondary | ICD-10-CM | POA: Diagnosis not present

## 2023-02-17 DIAGNOSIS — E876 Hypokalemia: Secondary | ICD-10-CM | POA: Diagnosis present

## 2023-02-17 DIAGNOSIS — E785 Hyperlipidemia, unspecified: Secondary | ICD-10-CM | POA: Diagnosis present

## 2023-02-17 DIAGNOSIS — K5730 Diverticulosis of large intestine without perforation or abscess without bleeding: Secondary | ICD-10-CM | POA: Diagnosis not present

## 2023-02-17 DIAGNOSIS — Z7984 Long term (current) use of oral hypoglycemic drugs: Secondary | ICD-10-CM | POA: Diagnosis not present

## 2023-02-17 DIAGNOSIS — K6389 Other specified diseases of intestine: Secondary | ICD-10-CM | POA: Diagnosis not present

## 2023-02-17 DIAGNOSIS — Z85820 Personal history of malignant melanoma of skin: Secondary | ICD-10-CM | POA: Diagnosis not present

## 2023-02-17 DIAGNOSIS — D124 Benign neoplasm of descending colon: Secondary | ICD-10-CM | POA: Diagnosis present

## 2023-02-17 DIAGNOSIS — K640 First degree hemorrhoids: Secondary | ICD-10-CM | POA: Diagnosis present

## 2023-02-17 DIAGNOSIS — M545 Low back pain, unspecified: Secondary | ICD-10-CM | POA: Diagnosis present

## 2023-02-17 DIAGNOSIS — K2970 Gastritis, unspecified, without bleeding: Secondary | ICD-10-CM | POA: Diagnosis present

## 2023-02-17 DIAGNOSIS — C180 Malignant neoplasm of cecum: Secondary | ICD-10-CM

## 2023-02-17 DIAGNOSIS — I5032 Chronic diastolic (congestive) heart failure: Secondary | ICD-10-CM | POA: Diagnosis present

## 2023-02-17 DIAGNOSIS — E1165 Type 2 diabetes mellitus with hyperglycemia: Secondary | ICD-10-CM | POA: Diagnosis present

## 2023-02-17 DIAGNOSIS — Z823 Family history of stroke: Secondary | ICD-10-CM | POA: Diagnosis not present

## 2023-02-17 DIAGNOSIS — D62 Acute posthemorrhagic anemia: Secondary | ICD-10-CM | POA: Diagnosis present

## 2023-02-17 DIAGNOSIS — K635 Polyp of colon: Secondary | ICD-10-CM | POA: Diagnosis not present

## 2023-02-17 DIAGNOSIS — I110 Hypertensive heart disease with heart failure: Secondary | ICD-10-CM | POA: Diagnosis not present

## 2023-02-17 DIAGNOSIS — Z820 Family history of epilepsy and other diseases of the nervous system: Secondary | ICD-10-CM | POA: Diagnosis not present

## 2023-02-17 DIAGNOSIS — Z8249 Family history of ischemic heart disease and other diseases of the circulatory system: Secondary | ICD-10-CM | POA: Diagnosis not present

## 2023-02-17 DIAGNOSIS — Z833 Family history of diabetes mellitus: Secondary | ICD-10-CM | POA: Diagnosis not present

## 2023-02-17 DIAGNOSIS — Z87891 Personal history of nicotine dependence: Secondary | ICD-10-CM | POA: Diagnosis not present

## 2023-02-17 HISTORY — PX: COLONOSCOPY WITH PROPOFOL: SHX5780

## 2023-02-17 HISTORY — DX: Malignant neoplasm of cecum: C18.0

## 2023-02-17 LAB — MAGNESIUM: Magnesium: 1.9 mg/dL (ref 1.7–2.4)

## 2023-02-17 LAB — CBC WITH DIFFERENTIAL/PLATELET
Abs Immature Granulocytes: 0.02 10*3/uL (ref 0.00–0.07)
Basophils Absolute: 0 10*3/uL (ref 0.0–0.1)
Basophils Relative: 0 %
Eosinophils Absolute: 0.1 10*3/uL (ref 0.0–0.5)
Eosinophils Relative: 2 %
HCT: 29 % — ABNORMAL LOW (ref 36.0–46.0)
Hemoglobin: 9.3 g/dL — ABNORMAL LOW (ref 12.0–15.0)
Immature Granulocytes: 0 %
Lymphocytes Relative: 32 %
Lymphs Abs: 1.8 10*3/uL (ref 0.7–4.0)
MCH: 29.9 pg (ref 26.0–34.0)
MCHC: 32.1 g/dL (ref 30.0–36.0)
MCV: 93.2 fL (ref 80.0–100.0)
Monocytes Absolute: 0.5 10*3/uL (ref 0.1–1.0)
Monocytes Relative: 9 %
Neutro Abs: 3.2 10*3/uL (ref 1.7–7.7)
Neutrophils Relative %: 57 %
Platelets: 150 10*3/uL (ref 150–400)
RBC: 3.11 MIL/uL — ABNORMAL LOW (ref 3.87–5.11)
RDW: 14.3 % (ref 11.5–15.5)
WBC: 5.6 10*3/uL (ref 4.0–10.5)
nRBC: 0 % (ref 0.0–0.2)

## 2023-02-17 LAB — BASIC METABOLIC PANEL
Anion gap: 5 (ref 5–15)
BUN: 5 mg/dL — ABNORMAL LOW (ref 8–23)
CO2: 25 mmol/L (ref 22–32)
Calcium: 8.9 mg/dL (ref 8.9–10.3)
Chloride: 110 mmol/L (ref 98–111)
Creatinine, Ser: 0.68 mg/dL (ref 0.44–1.00)
GFR, Estimated: >60 mL/min (ref >60)
Glucose, Bld: 150 mg/dL — ABNORMAL HIGH (ref 70–99)
Potassium: 3.5 mmol/L (ref 3.5–5.1)
Sodium: 140 mmol/L (ref 135–145)

## 2023-02-17 LAB — GLUCOSE, CAPILLARY
Glucose-Capillary: 129 mg/dL — ABNORMAL HIGH (ref 70–99)
Glucose-Capillary: 148 mg/dL — ABNORMAL HIGH (ref 70–99)
Glucose-Capillary: 150 mg/dL — ABNORMAL HIGH (ref 70–99)
Glucose-Capillary: 156 mg/dL — ABNORMAL HIGH (ref 70–99)
Glucose-Capillary: 182 mg/dL — ABNORMAL HIGH (ref 70–99)
Glucose-Capillary: 210 mg/dL — ABNORMAL HIGH (ref 70–99)

## 2023-02-17 LAB — SURGICAL PATHOLOGY

## 2023-02-17 SURGERY — COLONOSCOPY WITH PROPOFOL
Anesthesia: General

## 2023-02-17 MED ORDER — PROPOFOL 10 MG/ML IV BOLUS
INTRAVENOUS | Status: DC | PRN
  Administered 2023-02-17: 40 mg via INTRAVENOUS

## 2023-02-17 MED ORDER — INSULIN ASPART 100 UNIT/ML IJ SOLN
0.0000 [IU] | INTRAMUSCULAR | Status: DC
  Administered 2023-02-17: 1 [IU] via SUBCUTANEOUS
  Administered 2023-02-17: 2 [IU] via SUBCUTANEOUS
  Administered 2023-02-17: 1 [IU] via SUBCUTANEOUS
  Filled 2023-02-17 (×4): qty 1

## 2023-02-17 MED ORDER — SODIUM CHLORIDE 0.9 % IV SOLN
INTRAVENOUS | Status: DC
  Administered 2023-02-17: 1000 mL via INTRAVENOUS

## 2023-02-17 MED ORDER — TRAZODONE HCL 50 MG PO TABS
25.0000 mg | ORAL_TABLET | Freq: Once | ORAL | Status: AC
  Administered 2023-02-17: 25 mg via ORAL
  Filled 2023-02-17: qty 1

## 2023-02-17 MED ORDER — PROPOFOL 500 MG/50ML IV EMUL
INTRAVENOUS | Status: DC | PRN
  Administered 2023-02-17: 100 ug/kg/min via INTRAVENOUS

## 2023-02-17 NOTE — Op Note (Signed)
[facility] Gastroenterology Patient Name: [name] Procedure Date: 02/17/2023 1:19 PM MRN: [id number] Account #: [id number] Date of Birth: [birth year] Admit Type: Inpatient Age: 89 Room: [facility] ENDO ROOM 3 Gender: Female Note Status: Finalized Instrument Name: Peds Colonoscope [id number] Procedure:             Colonoscopy Indications:           Melena Providers:             Andrey Farmer MD, MD Referring MD:          Jaci Standard. Rollene Rotunda (Referring MD) Medicines:             Monitored Anesthesia Care Complications:         No immediate complications. Estimated blood loss:                         Minimal. Procedure:             Pre-Anesthesia Assessment:                        - Prior to the procedure, a History and Physical was                         performed, and patient medications and allergies were                         reviewed. The patient is competent. The risks and                         benefits of the procedure and the sedation options and                         risks were discussed with the patient. All questions                         were answered and informed consent was obtained.                         Patient identification and proposed procedure were                         verified by the physician, the nurse, the                         anesthesiologist, the anesthetist and the technician                         in the endoscopy suite. Mental Status Examination:                         alert and oriented. Airway Examination: normal                         oropharyngeal airway and neck mobility. Respiratory                         Examination: clear to auscultation. CV Examination:  normal. Prophylactic Antibiotics: The patient does not                         require prophylactic antibiotics. Prior                         Anticoagulants: The patient has taken no anticoagulant                         or antiplatelet  agents. ASA Grade Assessment: III - A                         patient with severe systemic disease. After reviewing                         the risks and benefits, the patient was deemed in                         satisfactory condition to undergo the procedure. The                         anesthesia plan was to use monitored anesthesia care                         (MAC). Immediately prior to administration of                         medications, the patient was re-assessed for adequacy                         to receive sedatives. The heart rate, respiratory                         rate, oxygen saturations, blood pressure, adequacy of                         pulmonary ventilation, and response to care were                         monitored throughout the procedure. The physical                         status of the patient was re-assessed after the                         procedure.                        After obtaining informed consent, the colonoscope was                         passed under direct vision. Throughout the procedure,                         the patient's blood pressure, pulse, and oxygen                         saturations were monitored continuously. The  Colonoscope was introduced through the anus and                         advanced to the the cecum, identified by appendiceal                         orifice and ileocecal valve. The colonoscopy was                         performed without difficulty. The patient tolerated                         the procedure well. The quality of the bowel                         preparation was good. The ileocecal valve, appendiceal                         orifice, and rectum were photographed. Findings:      The perianal and digital rectal examinations were normal.      A 35 mm polypoid lesion was found at the ileocecal valve. The lesion was       ulcerated. No bleeding was present. Mucosa was biopsied with a  cold       forceps for histology. Estimated blood loss was minimal.      A 7 mm polyp was found in the descending colon. The polyp was sessile.       The polyp was removed with a cold snare. Resection and retrieval were       complete. To prevent bleeding post-intervention, one hemostatic clip was       successfully placed. There was no bleeding at the end of the procedure.      Many large-mouthed and small-mouthed diverticula were found in the       sigmoid colon, descending colon, transverse colon, hepatic flexure and       ascending colon.      Internal hemorrhoids were found during retroflexion. The hemorrhoids       were Grade I (internal hemorrhoids that do not prolapse).      The exam was otherwise without abnormality on direct and retroflexion       views. Impression:            - Polypoid lesion at the ileocecal valve. Biopsied.                        - One 7 mm polyp in the descending colon, removed with                         a cold snare. Resected and retrieved. Clip was placed.                        - Diverticulosis in the sigmoid colon, in the                         descending colon, in the transverse colon, at the                         hepatic flexure and in the ascending colon.                        -  Internal hemorrhoids.                        - The examination was otherwise normal on direct and                         retroflexion views. Recommendation:        - Return patient to hospital ward for ongoing care.                        - Resume previous diet.                        - Continue present medications.                        - Await pathology results.                        - Advance diet as tolerated. Procedure Code(s):     --- Professional ---                        [phone], Colonoscopy, flexible; with removal of                         tumor(s), polyp(s), or other lesion(s) by snare                         technique Diagnosis Code(s):     ---  Professional ---                        D49.0, Neoplasm of unspecified behavior of digestive                         system                        D12.4, Benign neoplasm of descending colon                        K64.0, First degree hemorrhoids                        K92.1, Melena (includes Hematochezia)                        K57.30, Diverticulosis of large intestine without                         perforation or abscess without bleeding CPT copyright 2022 American Medical Association. All rights reserved. The codes documented in this report are preliminary and upon coder review may  be revised to meet current compliance requirements. Andrey Farmer MD, MD 02/17/2023 2:12:20 PM Number of Addenda: 0 Note Initiated On: 02/17/2023 1:19 PM Scope Withdrawal Time: 0 hours 12 minutes 2 seconds  Total Procedure Duration: 0 hours 19 minutes 19 seconds  Estimated Blood Loss:  Estimated blood loss was minimal.      [facility]

## 2023-02-17 NOTE — Progress Notes (Signed)
PROGRESS NOTE    [name]  [id number] DOB: 06-18-1929 DOA: 02/15/2023 PCP: Gwyneth Sprout, FNP   Assessment & Plan:   Principal Problem:   Melena Active Problems:   Moderate aortic stenosis   Acute blood loss anemia   Chronic diastolic CHF (congestive heart failure)  Assessment and Plan:  Melena: etiology unclear. Hx of colonic polyps & diverticulosis. CTA of abd/pelvis diffuse diverticulosis but no active bleed was identified. S/p EGD on 02/16/23 which showed gastritis otherwise unremarkable. Will go for colonoscopy today. Continue on PPI. Continue on IVFs. Will monitor H&H   Acute blood loss anemia: w/ IDA. S/p IV iron x2. Will continue to monitor H&H  DM2: likely well controlled. Continue on SSI w/ accuchecks   Hypokalemia: WNL today  Hx of carotid artery stenosis: holding home dose of aspirin. Hx of CVA  Chronic diastolic CHF: w/ moderate aortic stenosis. Appears compensated. Echo in 01/2021 showed EF > 55%, moderate AS, grade I diastolic dysfunction     DVT prophylaxis: SCDs Code Status: full  Family Communication:  Disposition Plan:  likely d/c back home  Level of care: Med-Surg  Status is: Inpatient Remains inpatient appropriate because: severity of illness, going for colonoscopy today     Consultants:  GI   Procedures:   Antimicrobials:   Subjective: Pt c/o black stools last night but none today so far.   Objective: Vitals:   02/16/23 1202 02/16/23 1212 02/16/23 1222 02/16/23 2347  BP: (!) 100/55 114/68 123/70 (!) 143/62  Pulse: 90 88 82 72  Resp: 18 17 15 20   Temp:  (!) 96.7 F (35.9 C)  98.6 F (37 C)  TempSrc:  Temporal    SpO2: 97% 97% 100% 98%  Weight:      Height:        Intake/Output Summary (Last 24 hours) at 02/17/2023 0834 Last data filed at 02/17/2023 0701 Gross per 24 hour  Intake 4649.75 ml  Output --  Net 4649.75 ml   Filed Weights   02/15/23 1232  Weight: 62.1 kg    Examination:  General exam: Appears calm and  comfortable  Respiratory system: Clear to auscultation. Respiratory effort normal. Cardiovascular system: S1 & S2 +. No rubs, gallops or clicks. Systolic murmur 3/6 Gastrointestinal system: Abdomen is nondistended, soft and nontender.  Normal bowel sounds heard. Central nervous system: Alert and oriented. Moves all extremities  Psychiatry: Judgement and insight appear normal. Mood & affect appropriate.     Data Reviewed: I have personally reviewed following labs and imaging studies  CBC: Recent Labs  Lab 02/15/23 1244 02/15/23 1626 02/16/23 0542 02/17/23 0508  WBC 8.9  --  6.4 5.6  NEUTROABS  --   --  3.5 3.2  HGB 10.7* 10.4* 9.8* 9.3*  HCT 33.2* 32.9* 29.7* 29.0*  MCV 92.7  --  92.5 93.2  PLT 183  --  157 [id number]   Basic Metabolic Panel: Recent Labs  Lab 02/15/23 1244 02/16/23 0542 02/17/23 0508  NA 136 138 140  K 3.4* 3.5 3.5  CL 101 106 110  CO2 24 24 25   GLUCOSE 259* 162* 150*  BUN 16 11 5*  CREATININE 0.87 0.69 0.68  CALCIUM 9.3 9.0 8.9  MG  --   --  1.9   GFR: Estimated Creatinine Clearance: 38.9 mL/min (by C-G formula based on SCr of 0.68 mg/dL). Liver Function Tests: Recent Labs  Lab 02/15/23 1244  AST 40  ALT 28  ALKPHOS 66  BILITOT 0.8  PROT 7.3  ALBUMIN 4.0   No results for input(s): "LIPASE", "AMYLASE" in the last 168 hours. No results for input(s): "AMMONIA" in the last 168 hours. Coagulation Profile: No results for input(s): "INR", "PROTIME" in the last 168 hours. Cardiac Enzymes: No results for input(s): "CKTOTAL", "CKMB", "CKMBINDEX", "TROPONINI" in the last 168 hours. BNP (last 3 results) No results for input(s): "PROBNP" in the last 8760 hours. HbA1C: No results for input(s): "HGBA1C" in the last 72 hours. CBG: No results for input(s): "GLUCAP" in the last 168 hours. Lipid Profile: No results for input(s): "CHOL", "HDL", "LDLCALC", "TRIG", "CHOLHDL", "LDLDIRECT" in the last 72 hours. Thyroid Function Tests: No results for input(s):  "TSH", "T4TOTAL", "FREET4", "T3FREE", "THYROIDAB" in the last 72 hours. Anemia Panel: Recent Labs    02/15/23 1244 02/15/23 1600  VITAMINB12  --  259  FERRITIN 15  --   TIBC 455*  --   IRON 33  --    Sepsis Labs: No results for input(s): "PROCALCITON", "LATICACIDVEN" in the last 168 hours.  No results found for this or any previous visit (from the past 240 hour(s)).       Radiology Studies: CT ANGIO GI BLEED  Result Date: 02/15/2023 CLINICAL DATA:  Melena EXAM: CTA ABDOMEN AND PELVIS WITHOUT AND WITH CONTRAST TECHNIQUE: Multidetector CT imaging of the abdomen and pelvis was performed using the standard protocol during bolus administration of intravenous contrast. Multiplanar reconstructed images and MIPs were obtained and reviewed to evaluate the vascular anatomy. RADIATION DOSE REDUCTION: This exam was performed according to the departmental dose-optimization program which includes automated exposure control, adjustment of the mA and/or kV according to patient size and/or use of iterative reconstruction technique. CONTRAST:  100 mL OMNIPAQUE IOHEXOL 350 MG/ML SOLN COMPARISON:  09/16/2013 FINDINGS: VASCULAR Aorta: No significant aneurysm. Dense atheromatous calcifications throughout the aorta and its branches. Celiac: Patent without evidence of aneurysm, dissection, vasculitis or significant stenosis. SMA: Patent without evidence of aneurysm, dissection, vasculitis or significant stenosis. Renals: Both renal arteries are patent without evidence of aneurysm, dissection, vasculitis, fibromuscular dysplasia or significant stenosis. IMA: Patent without evidence of aneurysm, dissection, vasculitis or significant stenosis. Inflow: Patent without evidence of aneurysm, dissection, vasculitis or significant stenosis. Proximal Outflow: Bilateral common femoral and visualized portions of the superficial and profunda femoral arteries are patent without evidence of aneurysm, dissection, vasculitis or  significant stenosis. Veins: No obvious venous abnormality within the limitations of this arterial phase study. Review of the MIP images confirms the above findings. NON-VASCULAR Lower chest: No acute abnormality. Aortic and mitral calcifications. Hepatobiliary: No focal liver abnormality is seen. Status post cholecystectomy. No biliary dilatation. Pancreas: Unremarkable. No pancreatic ductal dilatation or surrounding inflammatory changes. Spleen: Normal in size without focal abnormality. Adrenals/Urinary Tract: Upper pole cyst 1.7 cm. No adrenal lesions. No hydronephrosis or stones identified. Unremarkable urinary bladder. Stomach/Bowel: Unremarkable appearance of the stomach. No bowel dilatation to suggest obstruction. No evidence of mucosal inflammatory changes. There is diffuse colonic diverticulosis. There was no evidence of extravasated IV contrast on the delayed images to suggest active GI hemorrhage at the time of contrast administration. Right lower quadrant bowel anastomosis noted. Lymphatic: Extensive atheromatous changes. Mid abdominal aorta is mildly ectatic, 2.4 cm. No enlarged abdominal or pelvic lymph nodes. Reproductive: Uterus and bilateral adnexa are unremarkable. Other: No abdominal wall hernia or abnormality. No abdominopelvic ascites. Musculoskeletal: No acute or significant osseous findings. IMPRESSION: VASCULAR Extensive atheromatous calcifications mildly ectatic mid abdominal aorta with no evidence of aneurysm, occlusion or dissection. NON-VASCULAR Diffuse diverticulosis. No extravasated  contrast was identified to suggest active GI bleed during contrast administration. Electronically Signed   By: Sammie Bench M.D.   On: 02/15/2023 16:47        Scheduled Meds:  amLODipine  10 mg Oral Daily   pantoprazole (PROTONIX) IV  40 mg Intravenous Q12H   Continuous Infusions:  lactated ringers 1,000 mL with potassium chloride 20 mEq infusion 50 mL/hr at 02/17/23 0701     LOS: 0 days     Time spent: 35 mins     Wyvonnia Dusky, MD [facility] Pager [phone]  If 7PM-7AM, please contact night-coverage [email or web address] 02/17/2023, 8:34 AM

## 2023-02-17 NOTE — Transfer of Care (Signed)
Immediate Anesthesia Transfer of Care Note  Patient: [name]  Procedure(s) Performed: COLONOSCOPY WITH PROPOFOL  Patient Location: PACU  Anesthesia Type:General  Level of Consciousness: awake and drowsy  Airway & Oxygen Therapy: Patient Spontanous Breathing  Post-op Assessment: Report given to RN and Post -op Vital signs reviewed and stable  Post vital signs: Reviewed and stable  Last Vitals:  Vitals Value Taken Time  BP 89/50 02/17/23 1404  Temp 35.9 C 02/17/23 1404  Pulse 66 02/17/23 1404  Resp 17 02/17/23 1404  SpO2 97 % 02/17/23 1404    Last Pain:  Vitals:   02/17/23 1404  TempSrc: Temporal  PainSc: Asleep         Complications: No notable events documented.

## 2023-02-17 NOTE — Progress Notes (Signed)
Post procedural report  called in to shift nurse Loyal Buba, RN

## 2023-02-17 NOTE — Anesthesia Preprocedure Evaluation (Signed)
Anesthesia Evaluation  Patient identified by MRN, date of birth, ID band Patient awake    Reviewed: Allergy & Precautions, NPO status , Patient's Chart, lab work & pertinent test results  History of Anesthesia Complications Negative for: history of anesthetic complications  Airway Mallampati: III  TM Distance: <3 FB Neck ROM: full    Dental  (+) Chipped   Pulmonary neg shortness of breath, former smoker   Pulmonary exam normal        Cardiovascular hypertension, (-) angina +CHF  Normal cardiovascular exam     Neuro/Psych  Headaches  negative psych ROS   GI/Hepatic Neg liver ROS,GERD  Controlled,,  Endo/Other  diabetes, Type 2    Renal/GU negative Renal ROS  negative genitourinary   Musculoskeletal   Abdominal   Peds  Hematology  (+) Blood dyscrasia, anemia   Anesthesia Other Findings Past Medical History: 08/05/2015: Acid reflux No date: Cancer     Comment:  skin 08/05/2015: Colon polyp 08/05/2015: DD (diverticular disease) No date: Diabetes mellitus without complication     Comment:  type 2 06/10/2021: Eczema of both upper extremities No date: Hyperlipidemia No date: Hypertension No date: IBS (irritable bowel syndrome) 04/20/2017: Irritable bowel syndrome with diarrhea 08/05/2015: LBP (low back pain) No date: Melanoma  Past Surgical History: No date: APPENDECTOMY No date: BACK SURGERY No date: BREAST BIOPSY; Right     Comment:  neg No date: BREAST SURGERY     Comment:  1980's 01/18/2015: CAROTID ARTERY ANGIOPLASTY; Left     Comment:  Dr. Delana Meyer [facility] 06/27/2012: CHOLECYSTECTOMY 1950: DECOMPRESSIVE LUMBAR LAMINECTOMY LEVEL 1 02/16/2023: ESOPHAGOGASTRODUODENOSCOPY (EGD) WITH PROPOFOL; N/A     Comment:  Procedure: ESOPHAGOGASTRODUODENOSCOPY (EGD) WITH               PROPOFOL;  Surgeon: Lesly Rubenstein, MD;  Location:               [facility] ENDOSCOPY;  Service: Endoscopy;  Laterality: N/A; 2012: EYE SURGERY;  Right     Comment:  cataract extraction No date: SKIN SURGERY  BMI    Body Mass Index: 25.06 kg/m      Reproductive/Obstetrics negative OB ROS                             Anesthesia Physical Anesthesia Plan  ASA: 3  Anesthesia Plan: General   Post-op Pain Management:    Induction: Intravenous  PONV Risk Score and Plan: Propofol infusion and TIVA  Airway Management Planned: Natural Airway and Nasal Cannula  Additional Equipment:   Intra-op Plan:   Post-operative Plan:   Informed Consent: I have reviewed the patients History and Physical, chart, labs and discussed the procedure including the risks, benefits and alternatives for the proposed anesthesia with the patient or authorized representative who has indicated his/her understanding and acceptance.     Dental Advisory Given  Plan Discussed with: Anesthesiologist, CRNA and Surgeon  Anesthesia Plan Comments: (Patient consented for risks of anesthesia including but not limited to:  - adverse reactions to medications - risk of airway placement if required - damage to eyes, teeth, lips or other oral mucosa - nerve damage due to positioning  - sore throat or hoarseness - Damage to heart, brain, nerves, lungs, other parts of body or loss of life  Patient voiced understanding.)       Anesthesia Quick Evaluation

## 2023-02-17 NOTE — Anesthesia Postprocedure Evaluation (Signed)
Anesthesia Post Note  Patient: [name]  Procedure(s) Performed: COLONOSCOPY WITH PROPOFOL  Patient location during evaluation: Endoscopy Anesthesia Type: General Level of consciousness: awake and alert Pain management: pain level controlled Vital Signs Assessment: post-procedure vital signs reviewed and stable Respiratory status: spontaneous breathing, nonlabored ventilation, respiratory function stable and patient connected to nasal cannula oxygen Cardiovascular status: blood pressure returned to baseline and stable Postop Assessment: no apparent nausea or vomiting Anesthetic complications: no   No notable events documented.   Last Vitals:  Vitals:   02/17/23 1424 02/17/23 1434  BP: (!) 117/44 (!) 112/41  Pulse: 66 66  Resp: 14 13  Temp:    SpO2: 97% 97%    Last Pain:  Vitals:   02/17/23 1434  TempSrc:   PainSc: 0-No pain                 [name]

## 2023-02-18 ENCOUNTER — Encounter: Payer: Self-pay | Admitting: Gastroenterology

## 2023-02-18 DIAGNOSIS — E876 Hypokalemia: Secondary | ICD-10-CM | POA: Diagnosis not present

## 2023-02-18 DIAGNOSIS — D62 Acute posthemorrhagic anemia: Secondary | ICD-10-CM | POA: Diagnosis not present

## 2023-02-18 DIAGNOSIS — K921 Melena: Secondary | ICD-10-CM | POA: Diagnosis not present

## 2023-02-18 LAB — BASIC METABOLIC PANEL
Anion gap: 8 (ref 5–15)
BUN: 7 mg/dL — ABNORMAL LOW (ref 8–23)
CO2: 24 mmol/L (ref 22–32)
Calcium: 8.7 mg/dL — ABNORMAL LOW (ref 8.9–10.3)
Chloride: 107 mmol/L (ref 98–111)
Creatinine, Ser: 0.79 mg/dL (ref 0.44–1.00)
GFR, Estimated: >60 mL/min (ref >60)
Glucose, Bld: 158 mg/dL — ABNORMAL HIGH (ref 70–99)
Potassium: 3.4 mmol/L — ABNORMAL LOW (ref 3.5–5.1)
Sodium: 139 mmol/L (ref 135–145)

## 2023-02-18 LAB — CBC
HCT: 30.7 % — ABNORMAL LOW (ref 36.0–46.0)
Hemoglobin: 9.7 g/dL — ABNORMAL LOW (ref 12.0–15.0)
MCH: 30.1 pg (ref 26.0–34.0)
MCHC: 31.6 g/dL (ref 30.0–36.0)
MCV: 95.3 fL (ref 80.0–100.0)
Platelets: 143 10*3/uL — ABNORMAL LOW (ref 150–400)
RBC: 3.22 MIL/uL — ABNORMAL LOW (ref 3.87–5.11)
RDW: 14.2 % (ref 11.5–15.5)
WBC: 6.3 10*3/uL (ref 4.0–10.5)
nRBC: 0 % (ref 0.0–0.2)

## 2023-02-18 LAB — GLUCOSE, CAPILLARY
Glucose-Capillary: 150 mg/dL — ABNORMAL HIGH (ref 70–99)
Glucose-Capillary: 149 mg/dL — ABNORMAL HIGH (ref 70–99)
Glucose-Capillary: 294 mg/dL — ABNORMAL HIGH (ref 70–99)

## 2023-02-18 MED ORDER — ASPIRIN EC 81 MG PO TBEC: 81.0000 mg | DELAYED_RELEASE_TABLET | Freq: Every day | ORAL | 11 refills | Status: DC

## 2023-02-18 MED ORDER — POTASSIUM CHLORIDE CRYS ER 20 MEQ PO TBCR
20.0000 meq | EXTENDED_RELEASE_TABLET | Freq: Once | ORAL | Status: AC
  Administered 2023-02-18: 20 meq via ORAL
  Filled 2023-02-18: qty 1

## 2023-02-18 NOTE — Discharge Summary (Signed)
Physician Discharge Summary  [name] DOB: 12/13/1928 DOA: 02/15/2023  PCP: Gwyneth Sprout, FNP  Admit date: 02/15/2023 Discharge date: 02/18/2023  Admitted From: home  Disposition:  home   Recommendations for Outpatient Follow-up:  Follow up with PCP in 1-2 weeks F/u w/ GI, Dr. Haig Prophet, for pathology results  Home Health: no  Equipment/Devices:  Discharge Condition: stable  CODE STATUS: full  Diet recommendation: Carb Modified   Brief/Interim Summary: HPI was taken from Dr. Mal Misty: [name] is a 89 y.o. female with medical history significant for moderate aortic stenosis, stroke on low-dose aspirin, type 2 diabetes mellitus, diverticulosis, colonic polyps, hyperlipidemia, hypertension, IBS, chronic low back pain, GERD, melanoma, who presented to the hospital with 2 weeks duration of black stools (melena).  She states she has been having intermittent black stools for 2 weeks.  She also complained of lower abdominal pain of about 2 weeks duration.  She has had back pain for a few weeks.  She does not think she has chronic low back pain but chart review shows that she has a medical history of low back pain.  She has presents dizziness and easy fatigability in the last 2 days preceding admission.  No fever, chills, vomiting, hematemesis, upper abdominal pain, urinary symptoms, chest pain, shortness of breath or palpitations.  She has been taking low-dose aspirin for years.   As per Dr. Jimmye Norman 4/3-02/18/23: Pt presented w/ melena and GI was consulted. Pt is s/p EGD which showed gastritis otherwise unremarkable. Pt is s/p colonoscopy which showed polypoid lesion at the ileocecal valve that was biopsied, one 7 mm polyp in descending colon that was removed w/ a cold snare, diverticulosis & internal hemorrhoids. Path was pending at time of d/c. Pt will f/u w/ GI, Dr. Haig Prophet for pathology results and pt verbalized her understanding. Pt's H&H were trending up on the day of d/c.  For more information, please see previous progress/consult notes.   Discharge Diagnoses:  Principal Problem:   Melena Active Problems:   Moderate aortic stenosis   Acute blood loss anemia   Chronic diastolic CHF (congestive heart failure)  Melena: etiology unclear. Hx of colonic polyps & diverticulosis. CTA of abd/pelvis diffuse diverticulosis but no active bleed was identified. S/p EGD on 02/16/23 which showed gastritis otherwise unremarkable. Will go for colonoscopy today. Continue on PPI. Will monitor H&H   Acute blood loss anemia: w/ IDA. S/p IV iron x2. Will continue to monitor H&H  DM2: likely well controlled. Continue on SSI w/ accuchecks   Hypokalemia: potassium given   Hx of carotid artery stenosis: holding home dose of aspirin. Hx of CVA  Chronic diastolic CHF: w/ moderate aortic stenosis. Appears compensated. Echo in 01/2021 showed EF > 55%, moderate AS, grade I diastolic dysfunction  Discharge Instructions  Discharge Instructions     Diet - low sodium heart healthy   Complete by: As directed    Diet Carb Modified   Complete by: As directed    Discharge instructions   Complete by: As directed    F/u pathology results w/ GI, Dr. Haig Prophet, in 1-2 weeks. F/u w/ PCP in 1-2 weeks. Hold home dose of aspirin until you see your PCP and/or GI   Increase activity slowly   Complete by: As directed       Allergies as of 02/18/2023       Reactions   Cephalosporins    "upset stomach"   Codeine    Macrolides And Ketolides    "  upset stomach" Other reaction(s): Unknown "upset stomach"   Nitrofurantoin    Other reaction(s): Other (See Comments)   Penicillins    Tramadol Nausea Only   Doxycycline Rash   Sulfa Antibiotics Rash        Medication List     TAKE these medications    amLODipine 10 MG tablet Commonly known as: NORVASC Take 1 tablet (10 mg total) by mouth daily.   aspirin EC 81 MG tablet Take 1 tablet (81 mg total) by mouth daily. Hold this medication  until you see your PCP What changed: additional instructions   atorvastatin 40 MG tablet Commonly known as: LIPITOR Take 1 tablet (40 mg total) by mouth daily.   cholecalciferol 25 MCG (1000 UT) tablet Generic drug: Cholecalciferol Take 1,000 Units by mouth daily.   glipiZIDE 10 MG 24 hr tablet Commonly known as: GLUCOTROL XL Take 1 tablet (10 mg total) by mouth every other day.   hydrochlorothiazide 12.5 MG tablet Commonly known as: HYDRODIURIL Take 1 tablet (12.5 mg total) by mouth every other day.   OMEGA-3 FATTY ACIDS PO Take 1 capsule by mouth daily.   temazepam 15 MG capsule Commonly known as: RESTORIL Take 1 capsule (15 mg total) by mouth at bedtime as needed for sleep.   traZODone 50 MG tablet Commonly known as: DESYREL Take 0.5-1 tablets (25-50 mg total) by mouth at bedtime as needed for sleep.        Follow-up Information     Locklear, Hilton Cork, MD Follow up.   Specialty: Gastroenterology Why: Call GI, Dr. Haig Prophet, to f/u on pathology results Contact information: [place] [address] [phone]         Gwyneth Sprout, FNP Follow up.   Specialty: Family Medicine Why: F/u in 1-2 weeks Contact information: [address] [place] [address] [phone]                Allergies  Allergen Reactions   Cephalosporins     "upset stomach"   Codeine    Macrolides And Ketolides     "upset stomach" Other reaction(s): Unknown "upset stomach"   Nitrofurantoin     Other reaction(s): Other (See Comments)   Penicillins    Tramadol Nausea Only   Doxycycline Rash   Sulfa Antibiotics Rash    Consultations: GI    Procedures/Studies: CT ANGIO GI BLEED  Result Date: 02/15/2023 CLINICAL DATA:  Melena EXAM: CTA ABDOMEN AND PELVIS WITHOUT AND WITH CONTRAST TECHNIQUE: Multidetector CT imaging of the abdomen and pelvis was performed using the standard protocol during bolus administration of intravenous contrast.  Multiplanar reconstructed images and MIPs were obtained and reviewed to evaluate the vascular anatomy. RADIATION DOSE REDUCTION: This exam was performed according to the departmental dose-optimization program which includes automated exposure control, adjustment of the mA and/or kV according to patient size and/or use of iterative reconstruction technique. CONTRAST:  100 mL OMNIPAQUE IOHEXOL 350 MG/ML SOLN COMPARISON:  09/16/2013 FINDINGS: VASCULAR Aorta: No significant aneurysm. Dense atheromatous calcifications throughout the aorta and its branches. Celiac: Patent without evidence of aneurysm, dissection, vasculitis or significant stenosis. SMA: Patent without evidence of aneurysm, dissection, vasculitis or significant stenosis. Renals: Both renal arteries are patent without evidence of aneurysm, dissection, vasculitis, fibromuscular dysplasia or significant stenosis. IMA: Patent without evidence of aneurysm, dissection, vasculitis or significant stenosis. Inflow: Patent without evidence of aneurysm, dissection, vasculitis or significant stenosis. Proximal Outflow: Bilateral common femoral and visualized portions of the superficial and profunda femoral arteries are  patent without evidence of aneurysm, dissection, vasculitis or significant stenosis. Veins: No obvious venous abnormality within the limitations of this arterial phase study. Review of the MIP images confirms the above findings. NON-VASCULAR Lower chest: No acute abnormality. Aortic and mitral calcifications. Hepatobiliary: No focal liver abnormality is seen. Status post cholecystectomy. No biliary dilatation. Pancreas: Unremarkable. No pancreatic ductal dilatation or surrounding inflammatory changes. Spleen: Normal in size without focal abnormality. Adrenals/Urinary Tract: Upper pole cyst 1.7 cm. No adrenal lesions. No hydronephrosis or stones identified. Unremarkable urinary bladder. Stomach/Bowel: Unremarkable appearance of the stomach. No bowel  dilatation to suggest obstruction. No evidence of mucosal inflammatory changes. There is diffuse colonic diverticulosis. There was no evidence of extravasated IV contrast on the delayed images to suggest active GI hemorrhage at the time of contrast administration. Right lower quadrant bowel anastomosis noted. Lymphatic: Extensive atheromatous changes. Mid abdominal aorta is mildly ectatic, 2.4 cm. No enlarged abdominal or pelvic lymph nodes. Reproductive: Uterus and bilateral adnexa are unremarkable. Other: No abdominal wall hernia or abnormality. No abdominopelvic ascites. Musculoskeletal: No acute or significant osseous findings. IMPRESSION: VASCULAR Extensive atheromatous calcifications mildly ectatic mid abdominal aorta with no evidence of aneurysm, occlusion or dissection. NON-VASCULAR Diffuse diverticulosis. No extravasated contrast was identified to suggest active GI bleed during contrast administration. Electronically Signed   By: Sammie Bench M.D.   On: 02/15/2023 16:47   (Echo, Carotid, EGD, Colonoscopy, ERCP)    Subjective: Pt denies any black stools today   Discharge Exam: Vitals:   02/17/23 2354 02/18/23 0830  BP: 127/63 (!) 123/54  Pulse: 75 76  Resp: 18 17  Temp: 97.7 F (36.5 C) 98.3 F (36.8 C)  SpO2: 97% 96%   Vitals:   02/17/23 1434 02/17/23 1607 02/17/23 2354 02/18/23 0830  BP: (!) 112/41 (!) 124/56 127/63 (!) 123/54  Pulse: 66 67 75 76  Resp: 13 16 18 17   Temp:  98.2 F (36.8 C) 97.7 F (36.5 C) 98.3 F (36.8 C)  TempSrc:      SpO2: 97% 99% 97% 96%  Weight:      Height:        General: Pt is alert, awake, not in acute distress Cardiovascular: S1/S2 +, no rubs, no gallops Respiratory: CTA bilaterally, no wheezing, no rhonchi Abdominal: Soft, NT, ND, bowel sounds + Extremities: no edema, no cyanosis    The results of significant diagnostics from this hospitalization (including imaging, microbiology, ancillary and laboratory) are listed below for  reference.     Microbiology: No results found for this or any previous visit (from the past 240 hour(s)).   Labs: BNP (last 3 results) No results for input(s): "BNP" in the last 8760 hours. Basic Metabolic Panel: Recent Labs  Lab 02/15/23 1244 02/16/23 0542 02/17/23 0508 02/18/23 0526  NA 136 138 140 139  K 3.4* 3.5 3.5 3.4*  CL 101 106 110 107  CO2 24 24 25 24   GLUCOSE 259* 162* 150* 158*  BUN 16 11 5* 7*  CREATININE 0.87 0.69 0.68 0.79  CALCIUM 9.3 9.0 8.9 8.7*  MG  --   --  1.9  --    Liver Function Tests: Recent Labs  Lab 02/15/23 1244  AST 40  ALT 28  ALKPHOS 66  BILITOT 0.8  PROT 7.3  ALBUMIN 4.0   No results for input(s): "LIPASE", "AMYLASE" in the last 168 hours. No results for input(s): "AMMONIA" in the last 168 hours. CBC: Recent Labs  Lab 02/15/23 1244 02/15/23 1626 02/16/23 0542 02/17/23 0508 02/18/23  0526  WBC 8.9  --  6.4 5.6 6.3  NEUTROABS  --   --  3.5 3.2  --   HGB 10.7* 10.4* 9.8* 9.3* 9.7*  HCT 33.2* 32.9* 29.7* 29.0* 30.7*  MCV 92.7  --  92.5 93.2 95.3  PLT 183  --  157 150 143*   Cardiac Enzymes: No results for input(s): "CKTOTAL", "CKMB", "CKMBINDEX", "TROPONINI" in the last 168 hours. BNP: Invalid input(s): "POCBNP" CBG: Recent Labs  Lab 02/17/23 1946 02/17/23 2352 02/18/23 0416 02/18/23 0912 02/18/23 1208  GLUCAP 210* 129* 150* 149* 294*   D-Dimer No results for input(s): "DDIMER" in the last 72 hours. Hgb A1c No results for input(s): "HGBA1C" in the last 72 hours. Lipid Profile No results for input(s): "CHOL", "HDL", "LDLCALC", "TRIG", "CHOLHDL", "LDLDIRECT" in the last 72 hours. Thyroid function studies No results for input(s): "TSH", "T4TOTAL", "T3FREE", "THYROIDAB" in the last 72 hours.  Invalid input(s): "FREET3" Anemia work up Recent Labs    02/15/23 1600  VITAMINB12 259   Urinalysis    Component Value Date/Time   COLORURINE STRAW (A) 02/15/2023 1626   APPEARANCEUR CLEAR (A) 02/15/2023 1626    APPEARANCEUR Clear 06/13/2021 1019   LABSPEC >1.046 (H) 02/15/2023 1626   LABSPEC 1.010 12/15/2014 1926   PHURINE 7.0 02/15/2023 1626   GLUCOSEU NEGATIVE 02/15/2023 [place] 12/15/2014 1926   HGBUR NEGATIVE 02/15/2023 1626   BILIRUBINUR NEGATIVE 02/15/2023 1626   BILIRUBINUR Negative 06/13/2021 [place] 12/15/2014 1926   KETONESUR NEGATIVE 02/15/2023 1626   PROTEINUR NEGATIVE 02/15/2023 1626   NITRITE NEGATIVE 02/15/2023 1626   LEUKOCYTESUR NEGATIVE 02/15/2023 1626   LEUKOCYTESUR NEGATIVE 12/15/2014 1926   Sepsis Labs Recent Labs  Lab 02/15/23 1244 02/16/23 0542 02/17/23 0508 02/18/23 0526  WBC 8.9 6.4 5.6 6.3   Microbiology No results found for this or any previous visit (from the past 240 hour(s)).   Time coordinating discharge: Over 30 minutes  SIGNED:   Wyvonnia Dusky, MD  [facility] 02/18/2023, 1:11 PM Pager   If 7PM-7AM, please contact night-coverage [email or web address]

## 2023-02-18 NOTE — Plan of Care (Signed)
  Problem: Education: Goal: Knowledge of General Education information will improve Description: Including pain rating scale, medication(s)/side effects and non-pharmacologic comfort measures Outcome: Progressing   Problem: Health Behavior/Discharge Planning: Goal: Ability to manage health-related needs will improve Outcome: Progressing   Problem: Clinical Measurements: Goal: Ability to maintain clinical measurements within normal limits will improve Outcome: Progressing Goal: Diagnostic test results will improve Outcome: Progressing   Problem: Activity: Goal: Risk for activity intolerance will decrease Outcome: Progressing   Problem: Nutrition: Goal: Adequate nutrition will be maintained Outcome: Progressing   Problem: Elimination: Goal: Will not experience complications related to bowel motility Outcome: Progressing Goal: Will not experience complications related to urinary retention Outcome: Progressing   Problem: Pain Managment: Goal: General experience of comfort will improve Outcome: Progressing   

## 2023-02-18 NOTE — TOC CM/SW Note (Signed)
Patient has orders to discharge home today. Chart reviewed. No TOC needs identified. CSW signing off.  Infant Zink, CSW [phone]  

## 2023-02-19 ENCOUNTER — Telehealth: Payer: Self-pay | Admitting: *Deleted

## 2023-02-19 LAB — SURGICAL PATHOLOGY

## 2023-02-19 NOTE — Transitions of Care (Post Inpatient/ED Visit) (Signed)
   02/19/2023  Name: [name] MRN: [id number] DOB: 08/04/1929  Today's TOC FU Call Status: Today's TOC FU Call Status:: Successful TOC FU Call Competed TOC FU Call Complete Date: 02/19/23  Transition Care Management Follow-up Telephone Call Date of Discharge: 02/18/23 Discharge Facility: [facility]) Type of Discharge: Inpatient Admission Primary Inpatient Discharge Diagnosis:: Melena How have you been since you were released from the hospital?: Same (Patient received call from gastroenterologist and was told she has cancer and they would referr her to a surgeon) Any questions or concerns?: Yes Patient Questions/Concerns:: Why did she need to go to PCP. RN explained that the Hospitalist had held her aspirin and she needs to go to be evaluated to see if her PCP want to restart it. Patient Questions/Concerns Addressed: Other:  Items Reviewed: Did you receive and understand the discharge instructions provided?: Yes Medications obtained and verified?: Yes (Medications Reviewed) Any new allergies since your discharge?: No Dietary orders reviewed?: No Do you have support at home?: Yes People in Home: alone Name of Support/Comfort Primary Source: [facility] and Equipment/Supplies: Were Home Health Services Ordered?: No Any new equipment or medical supplies ordered?: No  Functional Questionnaire: Do you need assistance with bathing/showering or dressing?: No Do you need assistance with meal preparation?: No Do you need assistance with eating?: No Do you have difficulty maintaining continence: No Do you need assistance with getting out of bed/getting out of a chair/moving?: No Do you have difficulty managing or taking your medications?: No  Follow up appointments reviewed: PCP Follow-up appointment confirmed?: Yes Date of PCP follow-up appointment?: 02/24/23 Follow-up Provider: Merita Norton 10:20 Specialist [facility] Follow-up appointment  confirmed?: No Reason Specialist Follow-Up Not Confirmed: Patient has Specialist Provider Number and will Call for Appointment (General surgeon is being contacted by the gastroenterologist) Do you need transportation to your follow-up appointment?: No Do you understand care options if your condition(s) worsen?: Yes-patient verbalized understanding  SDOH Interventions Today    Flowsheet Row Most Recent Value  SDOH Interventions   Food Insecurity Interventions Intervention Not Indicated  Housing Interventions Intervention Not Indicated  Transportation Interventions Intervention Not Indicated      Interventions Today    Flowsheet Row Most Recent Value  General Interventions   General Interventions Discussed/Reviewed General Interventions Discussed, General Interventions Reviewed, Doctor Visits  Doctor Visits Discussed/Reviewed Doctor Visits Discussed, Doctor Visits Reviewed, Specialist      TOC Interventions Today    Flowsheet Row Most Recent Value  TOC Interventions   TOC Interventions Discussed/Reviewed TOC Interventions Discussed, TOC Interventions Reviewed, Arranged PCP follow up within 7 days/Care Guide scheduled        Gean Maidens BSN RN [facility] [phone]

## 2023-02-23 ENCOUNTER — Telehealth: Payer: Self-pay

## 2023-02-23 ENCOUNTER — Ambulatory Visit: Payer: Self-pay | Admitting: Surgery

## 2023-02-23 DIAGNOSIS — C182 Malignant neoplasm of ascending colon: Secondary | ICD-10-CM

## 2023-02-23 NOTE — Progress Notes (Signed)
I,J'ya E Hunter,acting as a scribe for Jacky KindleElise T Gracieann Stannard, FNP.,have documented all relevant documentation on the behalf of Jacky Kindlelise T Americus Scheurich, FNP,as directed by  Jacky KindleElise T Keyairra Kolinski, FNP while in the presence of Jacky KindleElise T Coletta Lockner, FNP.  Established patient visit  Patient: [name]   DOB: 04/27/1929   89 y.o. Female  MRN: Schneider Visit Date: 02/24/2023  Today's healthcare provider: Jacky KindleElise T Jentry Mcqueary, FNP  Re Introduced to nurse practitioner role and practice setting.  All questions answered.  Discussed provider/patient relationship and expectations.  Chief Complaint  Patient presents with   Follow-up    Hospital F/U   Subjective    Leg Pain  The incident occurred more than 1 week ago. The pain is present in the right leg and left leg. The quality of the pain is described as cramping. The pain is at a severity of 8/10. The pain is moderate. The pain has been Fluctuating since onset. Pertinent negatives include no inability to bear weight or tingling. Treatments tried: walking out. The treatment provided moderate (takes awhile for relief to set in) relief.   HPI     Follow-up    Additional comments: Hospital F/U      Last edited by Tama HeadingsHunter, J'Ya E, CMA on 02/24/2023 10:16 AM.      Follow up Hospitalization  Patient was admitted to [facility] on 02/15/2023 and discharged on 02/18/2023. She was treated for Melena. Treatment for this included esophagogastroduodenoscopy. Telephone follow up was done on 02/23/2023. She reports excellent compliance with treatment. She reports this condition is resolved.  ----------------------------------------------------------------------------------------- Patient is having a surgery on 03/16/2023 to have colon reseccted/  Medications: Outpatient Medications Prior to Visit  Medication Sig   amLODipine (NORVASC) 10 MG tablet Take 1 tablet (10 mg total) by mouth daily.   aspirin EC 81 MG tablet Take 1 tablet (81 mg total) by mouth daily. Hold this medication  until you see your PCP   atorvastatin (LIPITOR) 40 MG tablet Take 1 tablet (40 mg total) by mouth daily.   cholecalciferol (VITAMIN D) 1000 UNITS tablet Take 1,000 Units by mouth daily.    glipiZIDE (GLUCOTROL XL) 10 MG 24 hr tablet Take 1 tablet (10 mg total) by mouth every other day.   hydrochlorothiazide (HYDRODIURIL) 12.5 MG tablet Take 1 tablet (12.5 mg total) by mouth every other day.   neomycin (MYCIFRADIN) 500 MG tablet Take 500 mg by mouth 3 (three) times daily.   OMEGA-3 FATTY ACIDS PO Take 1 capsule by mouth daily.    temazepam (RESTORIL) 15 MG capsule Take 1 capsule (15 mg total) by mouth at bedtime as needed for sleep.   traZODone (DESYREL) 50 MG tablet Take 0.5-1 tablets (25-50 mg total) by mouth at bedtime as needed for sleep.   triamcinolone cream (KENALOG) 0.1 % Apply 1 Application topically 2 (two) times daily.   No facility-administered medications prior to visit.    Review of Systems  Neurological:  Negative for tingling.     Objective    BP 118/75 (BP Location: Left Arm, Patient Position: Sitting, Cuff Size: Normal)   Pulse 84   Temp 97.8 F (36.6 C) (Oral)   Ht 5\' 1"  (1.549 m)   Wt 131 lb (59.4 kg)   SpO2 97%   BMI 24.75 kg/m   Physical Exam Vitals and nursing note reviewed.  Constitutional:      General: She is not in acute distress.    Appearance: Normal appearance. She is normal weight. She is  not ill-appearing, toxic-appearing or diaphoretic.  HENT:     Head: Normocephalic and atraumatic.  Cardiovascular:     Rate and Rhythm: Normal rate and regular rhythm.     Pulses: Normal pulses.     Heart sounds: Normal heart sounds. No murmur heard.    No friction rub. No gallop.  Pulmonary:     Effort: Pulmonary effort is normal. No respiratory distress.     Breath sounds: Normal breath sounds. No stridor. No wheezing, rhonchi or rales.  Chest:     Chest wall: No tenderness.  Abdominal:     General: Bowel sounds are normal.     Palpations: Abdomen is  soft.  Musculoskeletal:        General: No swelling, tenderness, deformity or signs of injury. Normal range of motion.     Right lower leg: No edema.     Left lower leg: No edema.  Skin:    General: Skin is warm and dry.     Capillary Refill: Capillary refill takes less than 2 seconds.     Coloration: Skin is not jaundiced or pale.     Findings: No bruising, erythema, lesion or rash.  Neurological:     General: No focal deficit present.     Mental Status: She is alert and oriented to person, place, and time. Mental status is at baseline.     Cranial Nerves: No cranial nerve deficit.     Sensory: No sensory deficit.     Motor: No weakness.     Coordination: Coordination normal.  Psychiatric:        Mood and Affect: Mood normal.        Behavior: Behavior normal.        Thought Content: Thought content normal.        Judgment: Judgment normal.     No results found for any visits on 02/24/23.  Assessment & Plan     Problem List Items Addressed This Visit       Digestive   Malignant neoplasm of colon    Acute on chronic, >3cm lesion. Plan for surgical intervention following CT scan.        Relevant Medications   neomycin (MYCIFRADIN) 500 MG tablet     Other   Hospital discharge follow-up - Primary    4/1-4/4 admission with finding of colon cancer; symptoms have stabilized. Had labs completed by surgery; defers repeat today      Return if symptoms worsen or fail to improve.     Leilani Merl, FNP, have reviewed all documentation for this visit. The documentation on 02/26/23 for the exam, diagnosis, procedures, and orders are all accurate and complete.  Jacky Kindle, FNP  [facility] [phone] (phone) [phone] (fax)  [facility]

## 2023-02-23 NOTE — Transitions of Care (Post Inpatient/ED Visit) (Signed)
   02/23/2023  Name: [name] MRN: [id number] DOB: 22-Apr-1929 Today's TOC FU Call Status:  Attempted to reach the patient regarding the most recent Inpatient/ED visit and EMMI call response. Follow Up Plan: Additional outreach attempts will be made to reach the patient to complete the Transitions of Care (Post Inpatient/ED visit) call.   Jodelle Gross, RN, BSN, CCM Care Management Coordinator [facility] Phone: [phone]/Fax: [phone]

## 2023-02-23 NOTE — H&P (Unsigned)
Subjective:   CC: Cancer of ascending colon (CMS/HHS-HCC) [C18.2]  HPI:  [name] is a 89 y.o. female who was referred by [name]* for evaluation of above. Noted last colonoscopy.  Blood noted in stool along with weakness,   Past Medical History:  has a past medical history of Aortic valvular stenoses, Carotid artery stenosis, Diverticulosis, Hyperlipidemia, Hypertension, and Stroke (CMS/HHS-HCC).  Past Surgical History:  has a past surgical history that includes laminectomy lumbar spine; Appendectomy; Cholecystectomy; and melanoma removal.  Family History: family history includes Alzheimer's disease in her brother; Cancer in her mother.  Social History:  reports that she quit smoking about 52 years ago. Her smoking use included cigarettes. She has never used smokeless tobacco. She reports that she does not drink alcohol and does not use drugs.  Current Medications: has a current medication list which includes the following prescription(s): amlodipine, aspirin, atorvastatin, benzonatate, cholecalciferol, cyclobenzaprine, erythromycin, gabapentin, glipizide, hydrochlorothiazide, levofloxacin, omega-3 fatty acids-fish oil, oxybutynin, temazepam, tramadol, trazodone, triamcinolone, metronidazole, and neomycin.  Allergies:  Allergies  Allergen Reactions   Cephalosporins Unknown   Codeine Unknown   Doxycycline Rash   Macrolide Antibiotics Unknown    "upset stomach"   Nitrofurantoin Monohyd/M-Cryst Other (See Comments)   Penicillins Unknown   Sulfa (Sulfonamide Antibiotics) Unknown   Tramadol Nausea    ROS:  A 15 point review of systems was performed and pertinent positives and negatives noted in HPI   Objective:     BP 129/78   Pulse (!) 113   Ht 157.5 cm (5' 2.01")   Wt 60.8 kg (134 lb 0.6 oz)   LMP  (LMP Unknown)   BMI 24.51 kg/m   Constitutional :  No distress, cooperative, alert  Lymphatics/Throat:  Supple with no lymphadenopathy  Respiratory:   Clear to auscultation bilaterally  Cardiovascular:  Regular rate and rhythm  Gastrointestinal: Soft, non-tender, non-distended, no organomegaly.  Musculoskeletal: Steady gait and movement  Skin: Cool and moist  Psychiatric: Normal affect, non-agitated, not confused       LABS:   CEA- pending  RADS: Component 6 d ago  SURGICAL PATHOLOGY SURGICAL PATHOLOGY CASE: [id number] PATIENT: [name] Surgical Pathology Report     Specimen Submitted: A. Colon polypoid, ileocecal valve; cbx B. Colon polyp, descending; cold snare  Clinical History: IDA.  Diverticulosis, ileocecal valve lesion, colon polyp    DIAGNOSIS: A. COLON, ILEOCECAL VALVE, POLYPOID LESION; COLD BIOPSY: - INVASIVE MODERATELY DIFFERENTIATED ADENOCARCINOMA.  Comment: DNA mismatch repair testing will be deferred to the final resection, but may be performed on this specimen at request.  B. COLON POLYP, DESCENDING; COLD SNARE: - TUBULAR ADENOMA. - NEGATIVE FOR HIGH-GRADE DYSPLASIA AND MALIGNANCY.  Comment: A single fragment of mucosal tissue in part B displays changes compatible with at least high grade dysplasia, and appears similar to that seen in source A. Similar changes are not seen elsewhere in sampling of the descending colon polyp. Benign tissue carryover is favored.  GROSS DESCRIPTION: A. Labeled: cbx polypoid lesion ileocecal valve colon Received: Formalin Collection time: 1:46 PM on 02/17/2023 Placed into formalin time: 1:46 PM on 02/17/2023 Tissue fragment(s): Multiple Size: Aggregate, 1.7 x 0.5 x 0.2 cm Description: Received are fragments of tan-pink soft tissue admixed with intestinal debris.  The ratio of soft tissue to intestinal debris is 95: 5. Entirely submitted in 1 cassette.  B. Labeled: Cold snare polyp descending colon Received: Formalin Collection time: 1:55 PM on 02/17/2023 Placed into formalin time: 1:55 PM on 02/17/2023 Tissue fragment(s): 4 Size:  Aggregate, 1 x 0.7 x 0.4  cm Description: Received are fragments of tan soft tissue.  The 2 largest fragments have resection margins which are differentially inked.  These fragments are sectioned. Entirely submitted in cassettes 1-3 with 1 bisected fragment in cassette 1, 1 serially sectioned fragment in cassette 2, and the remaining fragments in cassette 3.  RB 02/18/2023  Final Diagnosis performed by Whitney Mantle, MD.   Electronically signed 02/19/2023 9:22:28AM The electronic signature indicates that the named Attending Pathologist has evaluated the specimen Technical component performed at [facility], [address] [place], [place] [address] Lab: [phone] Dir: Jolene Schimke, MD, MMM  Professional component performed at [facility], [facility], [address] [place], [place], [place] [address] Lab: [phone] Dir: Beryle Quant, MD    Assessment:      Cancer of ascending colon (CMS/HHS-HCC) [C18.2]  Plan:     Discussed pathophisiology of colon CA in depth.  The risk of laparoscopic colon resection surgery includes, but not limited to, recurrence, bleeding, chronic pain, post-op infxn, post-op SBO or ileus, hernias, resection of bowel, re-anastamosis, possible ostomy placement and need for re-operation to address said risks. The risks of general anesthetic, if used, includes MI, CVA, sudden death or even reaction to anesthetic medications also discussed. Alternatives include continued observation.  Benefits include possible symptom relief, preventing further decline in health and possible death.   Typical post-op recovery time of additional days in hospital for observation afterwards also discussed.   Prep ordered.  Will proceed with ERAS protocol as well.  Pending medical clearance from PCP.  CT chest and CEA ordered.  CT abd/pelvis performed on last admission, no concerns   The patient and family members verbalized understanding and all questions were answered to the patient's  satisfaction.  labs/images/medications/previous chart entries reviewed personally and relevant changes/updates noted above.

## 2023-02-24 ENCOUNTER — Encounter: Payer: Self-pay | Admitting: Family Medicine

## 2023-02-24 ENCOUNTER — Ambulatory Visit (INDEPENDENT_AMBULATORY_CARE_PROVIDER_SITE_OTHER): Payer: Medicare Other | Admitting: Family Medicine

## 2023-02-24 VITALS — BP 118/75 | HR 84 | Temp 97.8°F | Ht 61.0 in | Wt 131.0 lb

## 2023-02-24 DIAGNOSIS — C189 Malignant neoplasm of colon, unspecified: Secondary | ICD-10-CM | POA: Insufficient documentation

## 2023-02-24 DIAGNOSIS — Z09 Encounter for follow-up examination after completed treatment for conditions other than malignant neoplasm: Secondary | ICD-10-CM | POA: Diagnosis not present

## 2023-02-24 MED ORDER — IRON (FERROUS SULFATE) 325 (65 FE) MG PO TABS: 325.0000 mg | ORAL_TABLET | Freq: Every day | ORAL | 11 refills | Status: DC

## 2023-02-24 NOTE — Assessment & Plan Note (Signed)
Acute on chronic, >3cm lesion. Plan for surgical intervention following CT scan.

## 2023-02-25 ENCOUNTER — Encounter: Payer: Self-pay | Admitting: Gastroenterology

## 2023-02-26 NOTE — Assessment & Plan Note (Signed)
4/1-4/4 admission with finding of colon cancer; symptoms have stabilized. Had labs completed by surgery; defers repeat today

## 2023-03-05 ENCOUNTER — Other Ambulatory Visit: Payer: Self-pay | Admitting: Surgery

## 2023-03-05 DIAGNOSIS — C182 Malignant neoplasm of ascending colon: Secondary | ICD-10-CM

## 2023-03-07 ENCOUNTER — Encounter: Payer: Self-pay | Admitting: Urgent Care

## 2023-03-08 ENCOUNTER — Encounter
Admission: RE | Admit: 2023-03-08 | Discharge: 2023-03-08 | Disposition: A | Discharge status: Home / Self Care | Payer: Medicare Other | Source: Ambulatory Visit | Attending: Surgery | Admitting: Surgery

## 2023-03-08 ENCOUNTER — Ambulatory Visit
Admission: RE | Admit: 2023-03-08 | Discharge: 2023-03-08 | Disposition: A | Discharge status: Home / Self Care | Payer: Medicare Other | Source: Ambulatory Visit | Attending: Surgery | Admitting: Surgery

## 2023-03-08 DIAGNOSIS — E119 Type 2 diabetes mellitus without complications: Secondary | ICD-10-CM | POA: Insufficient documentation

## 2023-03-08 DIAGNOSIS — Z01818 Encounter for other preprocedural examination: Secondary | ICD-10-CM

## 2023-03-08 DIAGNOSIS — C182 Malignant neoplasm of ascending colon: Secondary | ICD-10-CM | POA: Diagnosis not present

## 2023-03-08 DIAGNOSIS — Z01812 Encounter for preprocedural laboratory examination: Secondary | ICD-10-CM

## 2023-03-08 HISTORY — DX: Other specified postprocedural states: R11.2

## 2023-03-08 HISTORY — DX: Melena: K92.1

## 2023-03-08 HISTORY — DX: Other specified postprocedural states: Z98.890

## 2023-03-08 HISTORY — DX: Occlusion and stenosis of unspecified carotid artery: I65.29

## 2023-03-08 HISTORY — DX: Chronic diastolic (congestive) heart failure: I50.32

## 2023-03-08 HISTORY — DX: Nonrheumatic aortic (valve) stenosis: I35.0

## 2023-03-08 HISTORY — DX: Malignant neoplasm of colon, unspecified: C18.9

## 2023-03-08 HISTORY — DX: Cerebral infarction, unspecified: I63.9

## 2023-03-08 HISTORY — DX: Cardiac murmur, unspecified: R01.1

## 2023-03-08 HISTORY — DX: Anemia, unspecified: D64.9

## 2023-03-08 HISTORY — DX: Phlebitis and thrombophlebitis of unspecified site: I80.9

## 2023-03-08 LAB — HEMOGLOBIN A1C
Hgb A1c MFr Bld: 6.7 % — ABNORMAL HIGH (ref 4.8–5.6)
Mean Plasma Glucose: 145.59 mg/dL

## 2023-03-08 LAB — TYPE AND SCREEN
ABO/RH(D): A NEG
Antibody Screen: NEGATIVE

## 2023-03-08 NOTE — Patient Instructions (Addendum)
Your procedure is scheduled on:03-16-23 Tuesday Report to the Registration Desk on the 1st floor of the [facility].Then proceed to the 2nd floor Surgery Desk To find out your arrival time, please call [phone] between 1PM - 3PM on:03-15-23 Monday If your arrival time is 6:00 am, do not arrive before that time as the [facility] entrance doors do not open until 6:00 am.  REMEMBER: Instructions that are not followed completely may result in serious medical risk, up to and including death; or upon the discretion of your surgeon and anesthesiologist your surgery may need to be rescheduled.  Do not eat food after midnight the night before surgery.  No gum chewing or hard candies.  You may however, drink Water up to 2 hours before you are scheduled to arrive for your surgery. Do not drink anything within 2 hours of your scheduled arrival time  One week prior to surgery: Stop Anti-inflammatories (NSAIDS) such as Advil, Aleve, Ibuprofen, Motrin, Naproxen, Naprosyn and Aspirin based products such as Excedrin, Goody's Powder, BC Powder.You may however, continue to take Tylenol if needed for pain up until the day of surgery. Stop ANY OVER THE COUNTER supplements/vitamins NOW (03-08-23) until after surgery (Vitamin D, Zinc and Omega 3)  TAKE ONLY THESE MEDICATIONS THE MORNING OF SURGERY WITH A SIP OF WATER: -amLODipine (NORVASC)  -atorvastatin (LIPITOR)   Follow Dr Geoffery Lyons office regarding your Bowel Prep  No Alcohol for 24 hours before or after surgery.  No Smoking including e-cigarettes for 24 hours before surgery.  No chewable tobacco products for at least 6 hours before surgery.  No nicotine patches on the day of surgery.  Do not use any "recreational" drugs for at least a week (preferably 2 weeks) before your surgery.  Please be advised that the combination of cocaine and anesthesia may have negative outcomes, up to and including death. If you test positive for cocaine, your surgery  will be cancelled.  On the morning of surgery brush your teeth with toothpaste and water, you may rinse your mouth with mouthwash if you wish. Do not swallow any toothpaste or mouthwash.  Use CHG Soap as directed on instruction sheet.  Do not wear jewelry, make-up, hairpins, clips or nail polish.  Do not wear lotions, powders, or perfumes.   Do not shave body hair from the neck down 48 hours before surgery.  Contact lenses, hearing aids and dentures may not be worn into surgery.  Do not bring valuables to the hospital. [facility] is not responsible for any missing/lost belongings or valuables.   Notify your doctor if there is any change in your medical condition (cold, fever, infection).  Wear comfortable clothing (specific to your surgery type) to the hospital.  After surgery, you can help prevent lung complications by doing breathing exercises.  Take deep breaths and cough every 1-2 hours. Your doctor may order a device called an Incentive Spirometer to help you take deep breaths. When coughing or sneezing, hold a pillow firmly against your incision with both hands. This is called "splinting." Doing this helps protect your incision. It also decreases belly discomfort.  If you are being admitted to the hospital overnight, leave your suitcase in the car. After surgery it may be brought to your room.  In case of increased patient census, it may be necessary for you, the patient, to continue your postoperative care in the Same Day Surgery department.  If you are being discharged the day of surgery, you will not be allowed to  drive home. You will need a responsible individual to drive you home and stay with you for 24 hours after surgery.   If you are taking public transportation, you will need to have a responsible individual with you.  Please call the [facility]. at [phone] if you have any questions about these instructions.  Surgery Visitation  Policy:  Patients having surgery or a procedure may have two visitors.  Children under the age of 66 must have an adult with them who is not the patient.  Inpatient Visitation:    Visiting hours are 7 a.m. to 8 p.m. Up to four visitors are allowed at one time in a patient room. The visitors may rotate out with other people during the day.  One visitor age 64 or older may stay with the patient overnight and must be in the room by 8 p.m.     Preparing for Surgery with CHLORHEXIDINE GLUCONATE (CHG) Soap  Chlorhexidine Gluconate (CHG) Soap  o An antiseptic cleaner that kills germs and bonds with the skin to continue killing germs even after washing  o Used for showering the night before surgery and morning of surgery  Before surgery, you can play an important role by reducing the number of germs on your skin.  CHG (Chlorhexidine gluconate) soap is an antiseptic cleanser which kills germs and bonds with the skin to continue killing germs even after washing.  Please do not use if you have an allergy to CHG or antibacterial soaps. If your skin becomes reddened/irritated stop using the CHG.  1. Shower the NIGHT BEFORE SURGERY and the MORNING OF SURGERY with CHG soap.  2. If you choose to wash your hair, wash your hair first as usual with your normal shampoo.  3. After shampooing, rinse your hair and body thoroughly to remove the shampoo.  4. Use CHG as you would any other liquid soap. You can apply CHG directly to the skin and wash gently with a scrungie or a clean washcloth.  5. Apply the CHG soap to your body only from the neck down. Do not use on open wounds or open sores. Avoid contact with your eyes, ears, mouth, and genitals (private parts). Wash face and genitals (private parts) with your normal soap.  6. Wash thoroughly, paying special attention to the area where your surgery will be performed.  7. Thoroughly rinse your body with warm water.  8. Do not shower/wash with your  normal soap after using and rinsing off the CHG soap.  9. Pat yourself dry with a clean towel.  10. Wear clean pajamas to bed the night before surgery.  12. Place clean sheets on your bed the night of your first shower and do not sleep with pets.  13. Shower again with the CHG soap on the day of surgery prior to arriving at the hospital.  14. Do not apply any deodorants/lotions/powders.  15. Please wear clean clothes to the hospital.

## 2023-03-09 ENCOUNTER — Ambulatory Visit
Admission: RE | Admit: 2023-03-09 | Discharge: 2023-03-09 | Disposition: A | Discharge status: Home / Self Care | Payer: Medicare Other | Source: Ambulatory Visit | Attending: Surgery | Admitting: Surgery

## 2023-03-09 DIAGNOSIS — R911 Solitary pulmonary nodule: Secondary | ICD-10-CM

## 2023-03-09 DIAGNOSIS — C3490 Malignant neoplasm of unspecified part of unspecified bronchus or lung: Secondary | ICD-10-CM

## 2023-03-09 DIAGNOSIS — R918 Other nonspecific abnormal finding of lung field: Secondary | ICD-10-CM | POA: Diagnosis not present

## 2023-03-09 DIAGNOSIS — C189 Malignant neoplasm of colon, unspecified: Secondary | ICD-10-CM | POA: Diagnosis not present

## 2023-03-09 DIAGNOSIS — C182 Malignant neoplasm of ascending colon: Secondary | ICD-10-CM | POA: Diagnosis not present

## 2023-03-09 DIAGNOSIS — I77810 Thoracic aortic ectasia: Secondary | ICD-10-CM

## 2023-03-09 HISTORY — DX: Solitary pulmonary nodule: R91.1

## 2023-03-09 HISTORY — DX: Thoracic aortic ectasia: I77.810

## 2023-03-09 HISTORY — DX: Malignant neoplasm of unspecified part of unspecified bronchus or lung: C34.90

## 2023-03-11 DIAGNOSIS — I6529 Occlusion and stenosis of unspecified carotid artery: Secondary | ICD-10-CM | POA: Diagnosis not present

## 2023-03-11 DIAGNOSIS — I350 Nonrheumatic aortic (valve) stenosis: Secondary | ICD-10-CM | POA: Diagnosis not present

## 2023-03-11 DIAGNOSIS — Z01818 Encounter for other preprocedural examination: Secondary | ICD-10-CM | POA: Diagnosis not present

## 2023-03-11 DIAGNOSIS — I10 Essential (primary) hypertension: Secondary | ICD-10-CM | POA: Diagnosis not present

## 2023-03-12 ENCOUNTER — Encounter: Payer: Self-pay | Admitting: Surgery

## 2023-03-12 NOTE — Progress Notes (Signed)
Perioperative / Anesthesia Services  Pre-Admission Testing Clinical Review / Preoperative Anesthesia Consult  Date: 03/14/23  Patient Demographics:  Name: [name] DOB:   05/23/1929 MRN:   [id number]  Planned Surgical Procedure(s):    Case: [id number] Date/Time: 03/16/23 0715   Procedure: XI ROBOT ASSISTED RIGHT COLECTOMY (Right: Abdomen)   Anesthesia type: General   Pre-op diagnosis: cancer of ascending colon C18.2   Location: [facility] OR ROOM 04 / [facility]   Surgeons: Sung Amabile, DO     NOTE: Available PAT nursing documentation and vital signs have been reviewed. Clinical nursing staff has updated patient's PMH/PSHx, current medication list, and drug allergies/intolerances to ensure comprehensive history available to assist in medical decision making as it pertains to the aforementioned surgical procedure and anticipated anesthetic course. Extensive review of available clinical information personally performed. [place] PMH and PSHx updated with any diagnoses/procedures that  may have been inadvertently omitted during her intake with the pre-admission testing department's nursing staff.  Clinical Discussion:  [name] is a 89 y.o. female who is submitted for pre-surgical anesthesia review and clearance prior to her undergoing the above procedure. Patient is a Former Smoker (quit 10/1976). Pertinent PMH includes: HFpEF, aortic stenosis, RIGHT pontine CVA, microembolic cerebral infarction (affected multiple areas), PSVT, BILATERAL carotid artery disease, cardiac murmur, ascending aortic dilation, aortic atherosclerosis, HTN, HLD, T2DM, LLL pulmonary nodule, GERD (no daily Tx), anemia, remote melanoma (s/p excision), adenocarcinoma of the ascending colon, IBS, OAB, chronic lower back pain, insomnia.  Patient is followed by cardiology Juliann Pares, MD). She was last seen in the cardiology clinic on 03/11/2023; notes reviewed. At the time of her clinic visit, patient  doing well overall from a cardiovascular perspective. Patient denied any chest pain, shortness of breath, PND, orthopnea, palpitations, significant peripheral edema, weakness, fatigue, vertiginous symptoms, or presyncope/syncope. Patient with a past medical history significant for cardiovascular diagnoses. Documented physical exam was grossly benign, providing no evidence of acute exacerbation and/or decompensation of the patient's known cardiovascular conditions.  Patient suffered CVA in 03/2007. MRI imaging of the brain revealed a subacute RIGHT pontine stroke.   MRI imaging of the brain performed on 12/16/2014 demonstrated multiple foci of restricted perfusion consisted with microembolic infarctions affected with RIGHT lateral medulla, LEFT temporal lobe, RIGHT thalamus, and LEFT inferior frontal lobe/basal ganglia. Radiologist noted that microembolic distribution pattern was suggestive of cardiac or ascending aortic source.   Patient with a history of BILATERAL carotid artery stenosis. She is status post LEFT carotid endarterectomy with CorMatrix patch angioplasty on 01/18/2015. Most recent doppler on 01/10/2018 revealed a 1-39% stenosis of the RIGHT internal carotid artery.   Long term cardiac event monitor study performed on 02/22/2015 demonstrated a predominant underlying sinus rhythm. Multiple (6) runs of PSVT were captured, with the longest run lasting 10 beats.   Patient diagnosed with aortic stenosis in 05/2017, at which time she had a mean trans-aortic value gradient of 21.3 mmHg. Since diagnosis, degree of stenosis has been serially monitored. Most recent TTE performed on 01/27/2021 revealed moderate aortic stenosis with a mean pressure gradient of 21.9 mmHg; AVA (VTI) = 0.99 cm2.  Patient has a HFpEF diagnosis. Most recent TTE was performed on 01/27/2021 revealing a normal left ventricular systolic function with an EF of >55%. Moderate LVH observed. Left ventricular diastolic Doppler  parameters consistent with abnormal relaxation (G1DD). Trivial tricuspid, in addition to mild aortic and mitral valve, regurgitation was noted on exam. All transvalvular gradients were noted to be normal  providing no evidence suggestive of valvular stenosis.   CT imaging of the chest performed of 03/09/2023 revealed a 2.1 x 1.7 x 2.1 cm nodule in the LEFT lower pulmonary lobe. Nodule was mixed ground-glass and subpleural with extension into the pleura and noted to have irregular margins. Finding concerning for a primary bronchogenic carcinoma (adenocarcinoma) rather than metastatic disease. Additionally, there was fusiform dilatation of the ascending (thoracic) aorta measuring up to 4.3 cm.   Blood pressure reasonably controlled at 142/60 mmHg on currently prescribed CCB (amlodipine) and diuretic (HCTZ) therapies.  Patient is on atorvastatin + omega-3 fatty acid for her HLD diagnosis and ASCVD prevention. T2DM well controlled on currently prescribed regimen; last HgbA1c was 6.7% when checked on 03/08/2023. She does not have an OSAH diagnosis. Functional capacity limited by age related debility and multiple medical comorbidities. Patient felt to be questionably able to achieve at least 4 METS of physical activity without experiencing, at least to some degree, angina/anginal equivalent symptoms. No changes were made to her medication regimen during her visit with cardiology.  Plans are to update TTE to assess overall function and known valvular stenosis. Study does not need to take place prior to upcoming surgery per Dr. Juliann Pares. Patient scheduled to follow-up with outpatient cardiology in 6 months or sooner if needed.  [name] underwent recent biopsy on 02/17/2023 of a polypoid lesion near the ileocecal valve. Pathology returned (+) for invasive moderately differentiated adenocarcinoma. Patient was referred to general surgery for consult to discuss definitive treatment options, those of which include  surgical resection. Following surgical consult, patient has been scheduled for a XI ROBOT ASSISTED RIGHT COLECTOMY on 03/16/2023 with Sung Amabile, DO. Given patient's past medical history significant for cardiovascular diagnoses, presurgical cardiac clearance has been sought by the PAT team. Per cardiology, "this patient is optimized for surgery and may proceed with the planned procedural course with a MODERATE risk of significant perioperative cardiovascular complications".  In review of her medication reconciliation, it is noted that patient is currently on prescribed daily antithrombotic therapy. She has been instructed on recommendations for holding her daily low-dose ASA by Dr. Tonna Boehringer. Plans are to restart as soon as postoperative bleeding risk felt to be minimized by attending surgeon.   Patient denies previous perioperative complications with anesthesia in the past. Patient has a PMH (+) for PONV. Symptoms and history of PONV will be discussed with patient by anesthesia team on the day of her procedure. Interventions will be ordered as deemed necessary based on patient's individual care needs as determined by anesthesiologist. In review of the available records, it is noted that patient underwent a general anesthetic course here at [facility] ([facility] in 02/2023 without documented complications.      02/24/2023   10:20 AM 02/18/2023    8:30 AM 02/17/2023   11:54 PM  Vitals with BMI  Height 5\' 1"     Weight 131 lbs    BMI 24.76    Systolic 118 123 161  Diastolic 75 54 63  Pulse 84 76 75    Providers/Specialists:   NOTE: Primary physician provider listed below. Patient may have been seen by APP or partner within same practice.   PROVIDER ROLE / SPECIALTY LAST Michelle Nasuti, DO General Surgery (Surgeon) 02/23/2023  Jacky Kindle, FNP Primary Care Provider 02/24/2023  Rudean Hitt, MD Cardiology 03/11/2023   Allergies:  Cephalosporins, Codeine,  Macrolides and ketolides, Nitrofurantoin, Penicillins, Tramadol, Doxycycline, and Sulfa antibiotics  Current  Home Medications:   No current facility-administered medications for this encounter.    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   a.) CT chest 03/09/2023: fusiform dilatation measuring 4.3 cm   Basal cell carcinoma (BCC) of left medial cheek    a.) s/p Mohs surgery 07/13/2022 at [facility]   Bilateral carotid artery disease [facility])    a.) doppler 04/03/2007: 50-75% RICA; b.) s/p LEFT CEA with CorMatrix patch angioplasty 01/18/2015; c.) doppler 11/02/2016: 40-59% RICA; d.) doppler 01/10/2018: 1-39% RICA   Diverticulosis 08/05/2015   Eczema of both upper extremities 06/10/2021   Heart failure with preserved left ventricular function (HFpEF) (HCC)    a.) TTE 05/17/2017: EF >55%, mild LVH, mild LAE, mild panval regurg; b.) TTE 06/27/2018 : EF >55%, mod LVH, mild LAE, mild panval regurg'; G1dd; c.) TTE 12/07/2019: EF  >55%, mild LVH, mild MAC, mild panval regurg; d.) TTE 01/27/2021: EF >55%, mod LVH, triv TR, mild AR/MR, G1DD   Heart murmur    History of right cataract extraction    Hyperlipidemia    Hypertension    Insomnia    a.) on BZO (temazepam) PRN   Irritable bowel syndrome with diarrhea 04/20/2017   LBP (low back pain) 08/05/2015   Left lower lobe pulmonary nodule 03/09/2023   a.) CT chest 03/09/2023 --> superior segment LLL --> 2.1 x 1.7 x 2.1 cm mixed ground-glass and subpleural nodule extending to pleura with irreg margins --> concerning for primary bronchogenic carcinoma rather than metastatic disease   Melanoma (HCC)    a.) s/p Mohs surgery 02/06/2020 - LEFT medial brow --> V to Y advancement flap   Melena    OAB (overactive bladder)    PONV (postoperative nausea and vomiting)    a.) single episode following cataract surgery   PSVT (paroxysmal supraventricular tachycardia) 02/22/2015   a.) holter 02/22/2015: 6 runs with longest lasting 10 beats   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   a.) MRI brain 04/02/2007 - subacute RIGHT pontine CVA   Stroke (HCC) 12/16/2014   a.) MRI brain 12/16/2014: punctate foci of restricted diffusion x 5 consistent with microembolic infarctions affecting the RIGHT lateral medulla, LEFT temporal lobe, 2 foci in the RIGHT thalamus, and LEFT inferior  frontal lobe/basal ganglia --> distribution would suggest cardiac or ascending aortic source   T2DM (type 2 diabetes mellitus) (HCC)    Tubular adenoma of colon  Past Surgical History:  Procedure Laterality Date   APPENDECTOMY     BREAST BIOPSY Right    neg   BREAST SURGERY     1980's   CAROTID ENDARTERECTOMY Left 01/18/2015   Procedure: CAROTID ENDARTERECTOMY; Location: [facility]; Surgeon: [name], MD   CATARACT EXTRACTION Right 2012   CHOLECYSTECTOMY  06/27/2012   COLONOSCOPY WITH PROPOFOL N/A 02/17/2023   Procedure: COLONOSCOPY WITH PROPOFOL;  Surgeon: Regis Bill, MD;  Location: [facility]  ENDOSCOPY;  Service: Endoscopy;  Laterality: N/A;   DECOMPRESSIVE LUMBAR LAMINECTOMY LEVEL 1  1950   ESOPHAGOGASTRODUODENOSCOPY (EGD) WITH PROPOFOL N/A 02/16/2023   Procedure: ESOPHAGOGASTRODUODENOSCOPY (EGD) WITH PROPOFOL;  Surgeon: Regis Bill, MD;  Location: [facility] ENDOSCOPY;  Service: Endoscopy;  Laterality: N/A;   MELANOMA EXCISION Left 02/06/2020   Procedure: MOHS SURGERY WITH V TO Y ADVANCEMENT FLAP (LEFT MEDIAL EYEBROW); Location: [facility]   MOHS SURGERY Left 07/13/2022   Procedure: MOHS SURGERY (BCC LEFT MEDIAL CHEEK); Location: [facility]   Family History  Problem Relation Age of Onset   Ovarian cancer Mother    Heart disease Brother    Heart attack Brother    Gout Brother    Alzheimer's disease Brother    Dementia Brother    Heart attack Brother    Diabetes Daughter    Melanoma Daughter    Stroke Son    Breast cancer Neg Hx    Social History   Tobacco Use   Smoking status: Former    Packs/day: .5    Types: Cigarettes    Quit date: 11/15/1976    Years since quitting: 46.3   Smokeless tobacco: Never  Vaping Use   Vaping Use: Never used  Substance Use Topics   Alcohol use: No   Drug use: No    Pertinent Clinical Results:  LABS:   Lab Results  Component Value Date   WBC 6.3 02/18/2023   HGB 9.7 (L) 02/18/2023   HCT 30.7 (L) 02/18/2023   MCV 95.3 02/18/2023   PLT 143 (L) 02/18/2023   Lab Results  Component Value Date   NA 139 02/18/2023   K 3.4 (L) 02/18/2023   CO2 24 02/18/2023   GLUCOSE 158 (H) 02/18/2023   BUN 7 (L) 02/18/2023   CREATININE 0.79 02/18/2023   CALCIUM 8.7 (L) 02/18/2023   EGFR 72 07/06/2022   GFRNONAA >60 02/18/2023   Lab Results  Component Value Date   HGBA1C 6.7 (H) 03/08/2023    ECG: Date: 02/15/2023 Time ECG obtained: 1446 PM Rate: 69 bpm Rhythm: Sinus rhythm  Axis (leads I and aVF): Normal Intervals: PR 145 ms. QRS 95 ms. QTc 419 ms. ST segment and T wave changes: Evidence of an age undetermined inferior infarct present.   Baseline wander in leads II, III, aVL, aVF, and V2 Comparison: Similar to previous tracing obtained on 12/12/2018   IMAGING / PROCEDURES: CT CHEST WO CONTRAST performed on 03/09/2023 Mixed ground-glass and subpleural nodule in the superior segment of the left lower lobe measuring 2.1 x 1.7 x 2.1 cm with irregular margins and 11 mm solid component. This is more concerning for primary bronchogenic malignancy such as adenocarcinoma rather than metastatic disease. Infectious or inflammatory etiologies are also considered. Consider pulmonary consultation, given solid component, PET/CT characterization could be considered. Additional scattered tiny solid and small ground-glass nodules throughout both lungs, nonspecific. Exophytic lesion from the upper pole of the right kidney is only partially included in the field of view, Hounsfield units of 28. This is new from 2014  abdominal CT. Favor complex cyst, however recommend renal protocol MRI for characterization. Fusiform dilatation of the ascending aorta, maximal dimension 4.3 cm. Consensus guidelines recommend annual imaging follow-up by CT or MRA, giving consideration to patient's advanced age. 2010; 121:[id number]. Aortic atherosclerosis   TRANSTHORACIC ECHOCARDIOGRAM performed on 01/27/2021 Normal left ventricular systolic function with an EF of >55% Moderate LVH Left ventricular diastolic Doppler parameters consistent with abnormal relaxation (G1DD). Trivial TR Mild AR and MR Moderate aortic valve stenosis with a mean pressure gradient of 21.9 mmHg; AVA (VTI) = 0.99 cm No pericardial effusion  VAS US CAROTID performed on 01/10/2018 1-39% stenosis of the right internal carotid artery. ICA/CCA ratio = 1.3.  Left Carotid: ICA/CCA ratio = 0.7.  Both vertebral arteries were patent with antegrade flow.  Normal flow hemodynamics were seen in bilateral subclavian arteries.   Impression and Plan:  [name] has been referred for pre-anesthesia  review and clearance prior to her undergoing the planned anesthetic and procedural courses. Available labs, pertinent testing, and imaging results were personally reviewed by me in preparation for upcoming operative/procedural course. [facility] medical record has been updated following extensive record review and patient interview with PAT staff.   This patient has been appropriately cleared by cardiology with an overall MODERATE risk of significant perioperative cardiovascular complications. Based on clinical review performed today (03/14/23), barring any significant acute changes in the patient's overall condition, it is anticipated that she will be able to proceed with the planned surgical intervention. Any acute changes in clinical condition may necessitate her procedure being postponed and/or cancelled. Patient will meet with anesthesia team (MD and/or CRNA) on the day of her procedure for preoperative evaluation/assessment. Questions regarding anesthetic course will be fielded at that time.   Pre-surgical instructions were reviewed with the patient during her PAT appointment, and questions were fielded to satisfaction by PAT clinical staff. She has been instructed on which medications that she will need to hold prior to surgery, as well as the ones that have been deemed safe/appropriate to take of the day of her procedure. As part of the general education provided by PAT, patient made aware both verbally and in writing, that she would need to abstain from the use of any illegal substances during her perioperative course.  She was advised that failure to follow the provided instructions could necessitate case cancellation or result serious perioperative complications up to and including death. Patient encouraged to contact PAT and/or her surgeon's office to discuss any questions or concerns that may arise prior to surgery; verbalized understanding.   Quentin Mulling, MSN, APRN, FNP-C, CEN [facility]  Peri-operative Services Nurse Practitioner Phone: [phone] Fax: [phone] 03/14/23 4:22 PM  NOTE: This note has been prepared using Dragon dictation software. Despite my best ability to proofread, there is always the potential that unintentional transcriptional errors may still occur from this process.

## 2023-03-12 NOTE — Progress Notes (Signed)
Perioperative Services Pre-Admission/Anesthesia Testing   Date: 03/12/23 Name: [name] MRN:   [id number]  Re: Consideration of preoperative prophylactic antibiotic change   Request sent to: Sung Amabile, DO (routed and/or faxed via [facility])  Planned Surgical Procedure(s):    Case: [id number] Date/Time: 03/16/23 0715   Procedure: XI ROBOT ASSISTED RIGHT COLECTOMY (Right: Abdomen)   Anesthesia type: General   Pre-op diagnosis: cancer of ascending colon C18.2   Location: [facility] OR ROOM 04 / [facility]   Surgeons: Sung Amabile, DO      Clinical Notes:  Patient has a documented allergy/intolerance to PCN  The actual reaction to PCN is unknown. Advising that cephalosporins has caused her to experience GI upset in the past.   Screened as appropriate for cephalosporin use during medication reconciliation No immediate angioedema, dysphagia, SOB, anaphylaxis symptoms. No severe rash involving mucous membranes or skin necrosis. No hospital admissions related to side effects of PCN/cephalosporin use.  No documented reaction to PCN or cephalosporin in the last 10 years.  Request:  As an evidence based approach to reducing the rate of incidence for post-operative SSI and the development of MDROs, could an agent that allows for narrower antimicrobial coverage for preoperative prophylaxis in this patient's upcoming surgical course be considered?   Currently ordered preoperative prophylactic ABX:  ciprofloxacin + metronidazole .   Specifically requesting change to CEFOTETAN.  Please communicate decision with me and I will change the orders in Epic as per your direction.   Things to consider: Many patients report that they were "allergic" to PCN earlier in life, however this does not translate into a true lifelong allergy. Patients can lose sensitivity to specific IgE antibodies over time if PCN is avoided (Kleris & Lugar, 2019).  Up to 10% of the adult population and 15%  of hospitalized patients report an allergy to PCN, however clinical studies suggest that 90% of those reporting an allergy can tolerate PCN antibiotics (Kleris & Lugar, 2019).  Cross-sensitivity between PCN and cephalosporins has been documented as being as high as 10%, however this estimation included data believed to have been collected in a setting where there was contamination. Newer data suggests that the prevalence of cross-sensitivity between PCN and cephalosporins is actually estimated to be closer to 1% (Hermanides et al., 2018).   Patients labeled as PCN allergic, whether they are truly allergic or not, have been found to have inferior outcomes in terms of rates of serious infection, and these patients tend to have longer hospital stays [facility] & Lugar, 2019).  Treatment related secondary infections, such as Clostridioides difficile, have been linked to the improper use of broad spectrum antibiotics in patients improperly labeled as PCN allergic (Kleris & Lugar, 2019).  Anaphylaxis from cephalosporins is rare and the evidence suggests that there is no increased risk of an anaphylactic type reaction when cephalosporins are used in a PCN allergic patient (Pichichero, 2006).  Citations: Hermanides J, Lemkes BA, Prins Gwenyth Bender MW, Terreehorst I. Presumed ?-Lactam Allergy and Cross-reactivity in the Operating Theater: A Practical Approach. Anesthesiology. 2018 Aug;129(2):335-342. doi: 10.1097/ALN.0000000000002252. PMID: 14782956.  Kleris, R. S., & Lugar, P. L. (2019). Things We Do For No Reason: Failing to Question a Penicillin Allergy History. Journal of hospital medicine, 14(10), (564) [phone]. Advance online publication. airportbarriers.com  Pichichero, M. E. (2006). Cephalosporins can be prescribed safely for penicillin-allergic patients. Journal of family medicine, 55(2), 106-112. Accessed: https://cdn.mdedge.com/files/s59fs-public/Document/September-2017/5502JFP_AppliedEvidence1.pdf    Quentin Mulling, MSN, APRN, FNP-C, CEN [facility]  Peri-operative Services  Nurse Practitioner FAX: [phone] 03/12/23 8:53 AM

## 2023-03-12 NOTE — Progress Notes (Signed)
  Perioperative Services Pre-Admission/Anesthesia Testing     Date: 03/12/23  Name: [name] MRN:   [id number]  Re: Change in ABX for upcoming surgery   Case: [id number] Date/Time: 03/16/23 0715   Procedure: XI ROBOT ASSISTED RIGHT COLECTOMY (Right: Abdomen)   Anesthesia type: General   Pre-op diagnosis: cancer of ascending colon C18.2   Location: [facility] OR ROOM 04 / [facility]   Surgeons: Sung Amabile, DO     Primary attending surgeon was consulted regarding consideration of therapeutic change in antimicrobial agent being used for preoperative prophylaxis in this patient's upcoming surgical case. Following analysis of the risk versus benefits, the patient's primary attending surgeon advised that it would be acceptable to discontinue the ordered  ciprofloxacin + metronidazole  and place an order for cefotetan 2 gm IV to be given on call to the OR. Orders for this patient were amended by me following collaborative conversation with attending surgeon taking into consideration of risk versus benefits associated with the change in therapy.  Quentin Mulling, MSN, APRN, FNP-C, CEN [facility]  Peri-operative Services Nurse Practitioner Phone: [phone] 03/12/23 3:46 PM

## 2023-03-13 ENCOUNTER — Encounter: Payer: Self-pay | Admitting: Surgery

## 2023-03-16 ENCOUNTER — Encounter: Admission: RE | Payer: Self-pay | Source: Home / Self Care

## 2023-03-16 ENCOUNTER — Inpatient Hospital Stay: Admission: RE | Admit: 2023-03-16 | Payer: Medicare Other | Source: Home / Self Care | Admitting: Surgery

## 2023-03-16 HISTORY — DX: Insomnia, unspecified: G47.00

## 2023-03-16 HISTORY — DX: Type 2 diabetes mellitus without complications: E11.9

## 2023-03-16 HISTORY — DX: Cataract extraction status, right eye: Z98.41

## 2023-03-16 HISTORY — DX: Overactive bladder: N32.81

## 2023-03-16 HISTORY — DX: Unspecified diastolic (congestive) heart failure: I50.30

## 2023-03-16 HISTORY — DX: Benign neoplasm of colon, unspecified: D12.6

## 2023-03-16 HISTORY — DX: Basal cell carcinoma of skin of other parts of face: C44.319

## 2023-03-16 HISTORY — DX: Nonrheumatic aortic (valve) stenosis: I35.0

## 2023-03-16 HISTORY — DX: Disorder of arteries and arterioles, unspecified: I77.9

## 2023-03-16 HISTORY — DX: Atherosclerosis of aorta: I70.0

## 2023-03-16 SURGERY — COLECTOMY, RIGHT, ROBOT-ASSISTED
Anesthesia: General | Site: Abdomen | Laterality: Right

## 2023-03-24 DIAGNOSIS — R0602 Shortness of breath: Secondary | ICD-10-CM | POA: Diagnosis not present

## 2023-03-24 DIAGNOSIS — R9389 Abnormal findings on diagnostic imaging of other specified body structures: Secondary | ICD-10-CM | POA: Diagnosis not present

## 2023-03-24 DIAGNOSIS — R911 Solitary pulmonary nodule: Secondary | ICD-10-CM | POA: Diagnosis not present

## 2023-03-29 ENCOUNTER — Other Ambulatory Visit: Payer: Self-pay | Admitting: Pulmonary Disease

## 2023-03-29 DIAGNOSIS — R9389 Abnormal findings on diagnostic imaging of other specified body structures: Secondary | ICD-10-CM

## 2023-03-29 DIAGNOSIS — R911 Solitary pulmonary nodule: Secondary | ICD-10-CM

## 2023-03-29 DIAGNOSIS — R0602 Shortness of breath: Secondary | ICD-10-CM

## 2023-04-01 ENCOUNTER — Ambulatory Visit
Admission: RE | Admit: 2023-04-01 | Discharge: 2023-04-01 | Disposition: A | Discharge status: Home / Self Care | Payer: Medicare Other | Source: Ambulatory Visit | Attending: Pulmonary Disease | Admitting: Pulmonary Disease

## 2023-04-01 DIAGNOSIS — I2510 Atherosclerotic heart disease of native coronary artery without angina pectoris: Secondary | ICD-10-CM | POA: Insufficient documentation

## 2023-04-01 DIAGNOSIS — I700 Atherosclerosis of aorta: Secondary | ICD-10-CM | POA: Diagnosis not present

## 2023-04-01 DIAGNOSIS — R0602 Shortness of breath: Secondary | ICD-10-CM

## 2023-04-01 DIAGNOSIS — R918 Other nonspecific abnormal finding of lung field: Secondary | ICD-10-CM | POA: Diagnosis not present

## 2023-04-01 DIAGNOSIS — K5730 Diverticulosis of large intestine without perforation or abscess without bleeding: Secondary | ICD-10-CM | POA: Insufficient documentation

## 2023-04-01 DIAGNOSIS — R9389 Abnormal findings on diagnostic imaging of other specified body structures: Secondary | ICD-10-CM

## 2023-04-01 DIAGNOSIS — R911 Solitary pulmonary nodule: Secondary | ICD-10-CM | POA: Diagnosis not present

## 2023-04-01 DIAGNOSIS — C180 Malignant neoplasm of cecum: Secondary | ICD-10-CM | POA: Diagnosis not present

## 2023-04-01 LAB — GLUCOSE, CAPILLARY: Glucose-Capillary: 191 mg/dL — ABNORMAL HIGH (ref 70–99)

## 2023-04-01 MED ORDER — FLUDEOXYGLUCOSE F - 18 (FDG) INJECTION
6.8000 | Freq: Once | INTRAVENOUS | Status: AC
  Administered 2023-04-01: 7.18 via INTRAVENOUS

## 2023-04-06 ENCOUNTER — Other Ambulatory Visit: Payer: Medicare Other

## 2023-04-08 DIAGNOSIS — R911 Solitary pulmonary nodule: Secondary | ICD-10-CM | POA: Diagnosis not present

## 2023-04-20 ENCOUNTER — Encounter: Payer: Self-pay | Admitting: Radiation Oncology

## 2023-04-20 ENCOUNTER — Ambulatory Visit
Admission: RE | Admit: 2023-04-20 | Discharge: 2023-04-20 | Disposition: A | Discharge status: Home / Self Care | Payer: Medicare Other | Source: Ambulatory Visit | Attending: Radiation Oncology | Admitting: Radiation Oncology

## 2023-04-20 VITALS — BP 144/78 | HR 92 | Temp 98.0°F | Resp 14 | Ht 62.0 in | Wt 133.3 lb

## 2023-04-20 DIAGNOSIS — Z87891 Personal history of nicotine dependence: Secondary | ICD-10-CM | POA: Diagnosis not present

## 2023-04-20 DIAGNOSIS — C3432 Malignant neoplasm of lower lobe, left bronchus or lung: Secondary | ICD-10-CM | POA: Diagnosis not present

## 2023-04-20 DIAGNOSIS — R911 Solitary pulmonary nodule: Secondary | ICD-10-CM

## 2023-04-20 DIAGNOSIS — Z510 Encounter for antineoplastic radiation therapy: Secondary | ICD-10-CM | POA: Insufficient documentation

## 2023-04-20 NOTE — Consult Note (Signed)
NEW PATIENT EVALUATION  Name: [name]  MRN: [id number]  Date:   04/20/2023     DOB: Dec 28, 1928   This 89 y.o. 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  REFERRING PHYSICIAN: Jacky Kindle, FNP  CHIEF COMPLAINT:  Chief Complaint  Patient presents with   Lung Cancer    Consult    DIAGNOSIS: The encounter diagnosis was Nodule of left lung.   PREVIOUS INVESTIGATIONS:  CT scans PET CT scans reviewed Clinical notes reviewed Pathology report reviewed  HPI: Patient is a 89 year old female recently diagnosed with a mass in her right ileocecal valve region which was positive for invasive moderately differentiated adenocarcinoma.  This area is hypermetabolic on PET scan and is scheduled for surgery.  She also presented with a slowly growing mass in the left lower lobe superior segment measuring 2.1 x 1.7 cm with irregular margins and a 1 cm solid component.  This was concerning for primary bronchogenic carcinoma.  She underwent a PET CT scan which showed intense hypermetabolic activity in the right colonic mass at the level of the ileocecal valve.  There was mild hypermetabolic activity in the superior segment left lower lobe corresponding to the probable primary bronchogenic adenocarcinoma.  From a pulmonary standpoint she is asymptomatic specifically denies cough hemoptysis or chest tightness.  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), Anemia, Aortic atherosclerosis (HCC), Aortic stenosis, Ascending aorta dilatation (HCC) (03/09/2023), Basal cell carcinoma (BCC) of left medial cheek, Bilateral carotid artery disease (HCC), Diverticulosis (08/05/2015), Eczema of  both upper extremities (06/10/2021), Heart failure with preserved left ventricular function (HFpEF) (HCC), Heart murmur, History of right cataract extraction, Hyperlipidemia, Hypertension, Insomnia, Irritable bowel syndrome with diarrhea (04/20/2017), LBP (low back pain) (08/05/2015), Left lower lobe pulmonary nodule (03/09/2023), Melanoma (HCC), Melena, OAB (overactive bladder), PONV (postoperative nausea and vomiting), PSVT (paroxysmal supraventricular tachycardia) (02/22/2015), 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    PAST SURGICAL HISTORY:  Past Surgical History:  Procedure Laterality Date   APPENDECTOMY     BREAST BIOPSY Right    neg   BREAST SURGERY     1980's   CAROTID ENDARTERECTOMY Left 01/18/2015   Procedure: CAROTID ENDARTERECTOMY; Location: [facility]; Surgeon: [name], MD   CATARACT EXTRACTION Right 2012   CHOLECYSTECTOMY  06/27/2012   COLONOSCOPY WITH PROPOFOL N/A 02/17/2023   Procedure: COLONOSCOPY WITH PROPOFOL;  Surgeon: Regis Bill, MD;  Location: [facility] ENDOSCOPY;  Service: Endoscopy;  Laterality: N/A;   DECOMPRESSIVE LUMBAR LAMINECTOMY LEVEL 1  1950   ESOPHAGOGASTRODUODENOSCOPY (EGD) WITH PROPOFOL N/A 02/16/2023   Procedure: ESOPHAGOGASTRODUODENOSCOPY (EGD) WITH PROPOFOL;  Surgeon: Regis Bill, MD;  Location: [facility] ENDOSCOPY;  Service: Endoscopy;  Laterality: N/A;   MELANOMA EXCISION Left 02/06/2020   Procedure: MOHS SURGERY WITH V TO Y ADVANCEMENT FLAP (LEFT MEDIAL EYEBROW); Location: [facility]   MOHS SURGERY Left 07/13/2022   Procedure: MOHS SURGERY (BCC LEFT MEDIAL CHEEK); Location: [facility]    FAMILY HISTORY: family history includes Alzheimer's disease in her brother; Dementia in her brother; Diabetes in her daughter; Gout in her brother; Heart attack in her brother and brother; Heart disease in her brother;  Melanoma in her daughter; Ovarian cancer in her mother;  Stroke in her son.  SOCIAL HISTORY:  reports that she quit smoking about 46 years ago. Her smoking use included cigarettes. She smoked an average of .5 packs per day. She has never used smokeless tobacco. She reports that she does not drink alcohol and does not use drugs.  ALLERGIES: Cephalosporins, Codeine, Macrolides and ketolides, Nitrofurantoin, Penicillins, Tramadol, Doxycycline, and Sulfa antibiotics  MEDICATIONS:  Current Outpatient Medications  Medication Sig Dispense Refill   amLODipine (NORVASC) 10 MG tablet Take 1 tablet (10 mg total) by mouth daily. (Patient taking differently: Take 10 mg by mouth every morning.) 90 tablet 3   aspirin EC 81 MG tablet Take 1 tablet (81 mg total) by mouth daily. Hold this medication until you see your PCP (Patient not taking: Reported on 03/08/2023) 30 tablet 11   atorvastatin (LIPITOR) 40 MG tablet Take 1 tablet (40 mg total) by mouth daily. (Patient taking differently: Take 40 mg by mouth every morning.) 90 tablet 3   cholecalciferol (VITAMIN D) 1000 UNITS tablet Take 1,000 Units by mouth daily.      glipiZIDE (GLUCOTROL XL) 10 MG 24 hr tablet Take 1 tablet (10 mg total) by mouth every other day. 45 tablet 3   hydrochlorothiazide (HYDRODIURIL) 12.5 MG tablet Take 1 tablet (12.5 mg total) by mouth every other day. 90 tablet 3   Iron, Ferrous Sulfate, 325 (65 Fe) MG TABS Take 325 mg by mouth daily. 60 tablet 11   neomycin (MYCIFRADIN) 500 MG tablet Take 500 mg by mouth 3 (three) times daily. (Patient not taking: Reported on 03/08/2023)     OMEGA-3 FATTY ACIDS PO Take 2 capsules by mouth daily.     oxybutynin (DITROPAN-XL) 10 MG 24 hr tablet Take 10 mg by mouth at bedtime.     polyethylene glycol (MIRALAX MIX-IN PAX) 17 g packet Take 17 g by mouth daily as needed.     temazepam (RESTORIL) 15 MG capsule Take 1 capsule (15 mg total) by mouth at bedtime as needed for sleep. (Patient taking differently: Take 15 mg by mouth at bedtime.) 90 capsule 3   Zinc 50  MG TABS Take 1 tablet by mouth daily at 6 (six) AM.     No current facility-administered medications for this encounter.    ECOG PERFORMANCE STATUS:  0 - Asymptomatic  REVIEW OF SYSTEMS: Patient denies any weight loss, fatigue, weakness, fever, chills or night sweats. Patient denies any loss of vision, blurred vision. Patient denies any ringing  of the ears or hearing loss. No irregular heartbeat. Patient denies heart murmur or history of fainting. Patient denies any chest pain or pain radiating to her upper extremities. Patient denies any shortness of breath, difficulty breathing at night, cough or hemoptysis. Patient denies any swelling in the lower legs. Patient denies any nausea vomiting, vomiting of blood, or coffee ground material in the vomitus. Patient denies any stomach pain. Patient states has had normal bowel movements no significant constipation or diarrhea. Patient denies any dysuria, hematuria or significant nocturia. Patient denies any problems walking, swelling in the joints or loss of balance. Patient denies any skin changes, loss of hair or loss of weight. Patient denies any excessive worrying or anxiety or significant depression. Patient denies any problems with insomnia. Patient denies excessive thirst, polyuria, polydipsia. Patient denies any swollen glands, patient denies easy bruising or easy bleeding. Patient denies any recent infections, allergies or URI. Patient "s visual fields have not changed significantly in  recent time.   PHYSICAL EXAM: BP (!) 144/78   Pulse 92   Temp 98 F (36.7 C)   Resp 14   Ht 5\' 2"  (1.575 m)   Wt 133 lb 4.8 oz (60.5 kg)   BMI 24.38 kg/m  Well-developed well-nourished patient in NAD. HEENT reveals PERLA, EOMI, discs not visualized.  Oral cavity is clear. No oral mucosal lesions are identified. Neck is clear without evidence of cervical or supraclavicular adenopathy. Lungs are clear to A&P. Cardiac examination is essentially unremarkable with  regular rate and rhythm without murmur rub or thrill. Abdomen is benign with no organomegaly or masses noted. Motor sensory and DTR levels are equal and symmetric in the upper and lower extremities. Cranial nerves II through XII are grossly intact. Proprioception is intact. No peripheral adenopathy or edema is identified. No motor or sensory levels are noted. Crude visual fields are within normal range.  LABORATORY DATA: Pathology reports and labs reviewed    RADIOLOGY RESULTS: CT scans and PET CT scans reviewed compatible with above-stated findings   IMPRESSION: Stage I adenocarcinoma of the left lower lobe in 89 year old female with also synchronous adenocarcinoma of the ileocecal valve  PLAN: At this time like to go with SBRT to her left lower lobe lesion.  Would plan on delivering 60 Gy in 6 fractions using SBRT.  Low risk profile including possible development of cough fatigue skin reaction all were described in detail to the patient.  I personally set up and ordered CT simulation with PET fusion for next week.  Patient comprehends my recommendations well.  I would like to take this opportunity to thank you for allowing me to participate in the care of your patient.Carmina Miller, MD

## 2023-04-21 DIAGNOSIS — Z87891 Personal history of nicotine dependence: Secondary | ICD-10-CM | POA: Diagnosis not present

## 2023-04-21 DIAGNOSIS — C3432 Malignant neoplasm of lower lobe, left bronchus or lung: Secondary | ICD-10-CM | POA: Diagnosis not present

## 2023-04-27 ENCOUNTER — Ambulatory Visit
Admission: RE | Admit: 2023-04-27 | Discharge: 2023-04-27 | Disposition: A | Discharge status: Home / Self Care | Payer: Medicare Other | Source: Ambulatory Visit | Attending: Radiation Oncology | Admitting: Radiation Oncology

## 2023-04-27 DIAGNOSIS — Z87891 Personal history of nicotine dependence: Secondary | ICD-10-CM | POA: Diagnosis not present

## 2023-04-27 DIAGNOSIS — Z510 Encounter for antineoplastic radiation therapy: Secondary | ICD-10-CM | POA: Diagnosis not present

## 2023-04-27 DIAGNOSIS — C3432 Malignant neoplasm of lower lobe, left bronchus or lung: Secondary | ICD-10-CM | POA: Diagnosis not present

## 2023-05-02 ENCOUNTER — Other Ambulatory Visit: Payer: Self-pay | Admitting: Family Medicine

## 2023-05-03 ENCOUNTER — Other Ambulatory Visit: Payer: Self-pay

## 2023-05-03 ENCOUNTER — Other Ambulatory Visit: Payer: Self-pay | Admitting: Family Medicine

## 2023-05-04 ENCOUNTER — Other Ambulatory Visit: Payer: Self-pay | Admitting: Family Medicine

## 2023-05-04 DIAGNOSIS — C3432 Malignant neoplasm of lower lobe, left bronchus or lung: Secondary | ICD-10-CM | POA: Diagnosis not present

## 2023-05-04 DIAGNOSIS — Z510 Encounter for antineoplastic radiation therapy: Secondary | ICD-10-CM | POA: Diagnosis not present

## 2023-05-04 DIAGNOSIS — Z87891 Personal history of nicotine dependence: Secondary | ICD-10-CM | POA: Diagnosis not present

## 2023-05-04 MED ORDER — TEMAZEPAM 15 MG PO CAPS: ORAL_CAPSULE | ORAL | 3 refills | Status: DC

## 2023-05-04 NOTE — Telephone Encounter (Signed)
Pt reports that her pharmacy has not received the refill request and it is showing it as print.  Medication Refill - Medication: temazepam (RESTORIL) 15 MG capsule   Has the patient contacted their pharmacy? Yes.    Preferred Pharmacy (with phone number or street name):  [facility] [address] [place], [place] - [address] Phone: [phone]  Fax: [phone]     Has the patient been seen for an appointment in the last year OR does the patient have an upcoming appointment? Yes.    Agent: Please be advised that RX refills may take up to 3 business days. We ask that you follow-up with your pharmacy.

## 2023-05-04 NOTE — Telephone Encounter (Signed)
Requested medication (s) are due for refill today: No  Requested medication (s) are on the active medication list: yes    Last refill: 05/03/23  #90  0 refills  Future visit scheduled no  Notes to clinic:Pt states pharmacy has not received and showing as 'Print.' Please review as med not delegated. Thank you.  Requested Prescriptions  Pending Prescriptions Disp Refills   temazepam (RESTORIL) 15 MG capsule 90 capsule 0     Not Delegated - Psychiatry: Anxiolytics/Hypnotics 2 Failed - 05/04/2023  4:16 PM      Failed - This refill cannot be delegated      Failed - Urine Drug Screen completed in last 360 days      Passed - Patient is not pregnant      Passed - Valid encounter within last 6 months    Recent Outpatient Visits           2 months ago Hospital discharge follow-up   [facility] Merita Norton T, FNP   1 year ago Chronic bilateral low back pain without sciatica   [facility] Merita Norton T, FNP   1 year ago Chronic bilateral low back pain without sciatica   [facility] Merita Norton T, FNP   2 years ago Diabetes mellitus without complication [facility])   [facility] [place], Alessandra Bevels, [place]   3 years ago Dermatitis   [facility] [place], Jodell C[place], [place]

## 2023-05-05 DIAGNOSIS — Z87891 Personal history of nicotine dependence: Secondary | ICD-10-CM | POA: Diagnosis not present

## 2023-05-05 DIAGNOSIS — K080 Exfoliation of teeth due to systemic causes: Secondary | ICD-10-CM | POA: Diagnosis not present

## 2023-05-05 DIAGNOSIS — C3432 Malignant neoplasm of lower lobe, left bronchus or lung: Secondary | ICD-10-CM | POA: Diagnosis not present

## 2023-05-11 ENCOUNTER — Ambulatory Visit
Admission: RE | Admit: 2023-05-11 | Discharge: 2023-05-11 | Disposition: A | Discharge status: Home / Self Care | Payer: Medicare Other | Source: Ambulatory Visit | Attending: Radiation Oncology | Admitting: Radiation Oncology

## 2023-05-11 ENCOUNTER — Other Ambulatory Visit: Payer: Self-pay

## 2023-05-11 DIAGNOSIS — Z510 Encounter for antineoplastic radiation therapy: Secondary | ICD-10-CM | POA: Diagnosis not present

## 2023-05-11 DIAGNOSIS — C3432 Malignant neoplasm of lower lobe, left bronchus or lung: Secondary | ICD-10-CM | POA: Diagnosis not present

## 2023-05-11 LAB — RAD ONC ARIA SESSION SUMMARY
Course Elapsed Days: 0
Plan Fractions Treated to Date: 1
Plan Prescribed Dose Per Fraction: 12 Gy
Plan Total Fractions Prescribed: 5
Plan Total Prescribed Dose: 60 Gy
Reference Point Dosage Given to Date: 12 Gy
Reference Point Session Dosage Given: 12 Gy
Session Number: 1

## 2023-05-13 ENCOUNTER — Ambulatory Visit
Admission: RE | Admit: 2023-05-13 | Discharge: 2023-05-13 | Disposition: A | Discharge status: Home / Self Care | Payer: Medicare Other | Source: Ambulatory Visit | Attending: Radiation Oncology | Admitting: Radiation Oncology

## 2023-05-13 ENCOUNTER — Other Ambulatory Visit: Payer: Self-pay

## 2023-05-13 DIAGNOSIS — C3432 Malignant neoplasm of lower lobe, left bronchus or lung: Secondary | ICD-10-CM | POA: Diagnosis not present

## 2023-05-13 DIAGNOSIS — Z510 Encounter for antineoplastic radiation therapy: Secondary | ICD-10-CM | POA: Diagnosis not present

## 2023-05-13 LAB — RAD ONC ARIA SESSION SUMMARY
Course Elapsed Days: 2
Plan Fractions Treated to Date: 2
Plan Prescribed Dose Per Fraction: 12 Gy
Plan Total Fractions Prescribed: 5
Plan Total Prescribed Dose: 60 Gy
Reference Point Dosage Given to Date: 24 Gy
Reference Point Session Dosage Given: 12 Gy
Session Number: 2

## 2023-05-17 ENCOUNTER — Ambulatory Visit
Admission: RE | Admit: 2023-05-17 | Discharge: 2023-05-17 | Disposition: A | Discharge status: Home / Self Care | Payer: Medicare Other | Source: Ambulatory Visit | Attending: Radiation Oncology | Admitting: Radiation Oncology

## 2023-05-17 ENCOUNTER — Other Ambulatory Visit: Payer: Self-pay

## 2023-05-17 DIAGNOSIS — C3432 Malignant neoplasm of lower lobe, left bronchus or lung: Secondary | ICD-10-CM | POA: Insufficient documentation

## 2023-05-17 DIAGNOSIS — Z510 Encounter for antineoplastic radiation therapy: Secondary | ICD-10-CM | POA: Insufficient documentation

## 2023-05-17 LAB — RAD ONC ARIA SESSION SUMMARY
Course Elapsed Days: 6
Plan Fractions Treated to Date: 3
Plan Prescribed Dose Per Fraction: 12 Gy
Plan Total Fractions Prescribed: 5
Plan Total Prescribed Dose: 60 Gy
Reference Point Dosage Given to Date: 36 Gy
Reference Point Session Dosage Given: 12 Gy
Session Number: 3

## 2023-05-19 ENCOUNTER — Ambulatory Visit
Admission: RE | Admit: 2023-05-19 | Discharge: 2023-05-19 | Disposition: A | Discharge status: Home / Self Care | Payer: Medicare Other | Source: Ambulatory Visit | Attending: Radiation Oncology | Admitting: Radiation Oncology

## 2023-05-19 ENCOUNTER — Other Ambulatory Visit: Payer: Self-pay

## 2023-05-19 DIAGNOSIS — Z510 Encounter for antineoplastic radiation therapy: Secondary | ICD-10-CM | POA: Diagnosis not present

## 2023-05-19 DIAGNOSIS — C3432 Malignant neoplasm of lower lobe, left bronchus or lung: Secondary | ICD-10-CM | POA: Diagnosis not present

## 2023-05-19 LAB — RAD ONC ARIA SESSION SUMMARY
Course Elapsed Days: 8
Plan Fractions Treated to Date: 4
Plan Prescribed Dose Per Fraction: 12 Gy
Plan Total Fractions Prescribed: 5
Plan Total Prescribed Dose: 60 Gy
Reference Point Dosage Given to Date: 48 Gy
Reference Point Session Dosage Given: 12 Gy
Session Number: 4

## 2023-05-24 ENCOUNTER — Other Ambulatory Visit: Payer: Self-pay

## 2023-05-24 ENCOUNTER — Ambulatory Visit
Admission: RE | Admit: 2023-05-24 | Discharge: 2023-05-24 | Disposition: A | Discharge status: Home / Self Care | Payer: Medicare Other | Source: Ambulatory Visit | Attending: Radiation Oncology | Admitting: Radiation Oncology

## 2023-05-24 DIAGNOSIS — Z510 Encounter for antineoplastic radiation therapy: Secondary | ICD-10-CM | POA: Diagnosis not present

## 2023-05-24 DIAGNOSIS — C3432 Malignant neoplasm of lower lobe, left bronchus or lung: Secondary | ICD-10-CM | POA: Diagnosis not present

## 2023-05-24 DIAGNOSIS — Z87891 Personal history of nicotine dependence: Secondary | ICD-10-CM | POA: Diagnosis not present

## 2023-05-24 LAB — RAD ONC ARIA SESSION SUMMARY
Course Elapsed Days: 13
Plan Fractions Treated to Date: 5
Plan Prescribed Dose Per Fraction: 12 Gy
Plan Total Fractions Prescribed: 5
Plan Total Prescribed Dose: 60 Gy
Reference Point Dosage Given to Date: 60 Gy
Reference Point Session Dosage Given: 12 Gy
Session Number: 5

## 2023-06-01 ENCOUNTER — Ambulatory Visit: Payer: Self-pay | Admitting: Surgery

## 2023-06-01 DIAGNOSIS — C182 Malignant neoplasm of ascending colon: Secondary | ICD-10-CM | POA: Diagnosis not present

## 2023-06-01 NOTE — H&P (Signed)
Subjective:    CC: Cancer of ascending colon (CMS/HHS-HCC) [C18.2]   HPI:   Subjective [name] is a 89 y.o. female who returns for evaluation of above. CT for mets workup noted lung nodule, bx proven primary lung CA. She is s/p radiation with report of good response and prognosis, ok to proceed with resection of colon cancer.  No new concerns today.   Previous report of Blood noted in stool along with weakness.   Past Medical History:  has a past medical history of Aortic valvular stenoses, Carotid artery stenosis, Diverticulosis, Hyperlipidemia, Hypertension, and Stroke (CMS/HHS-HCC).   Past Surgical History:  has a past surgical history that includes laminectomy lumbar spine; Appendectomy; Cholecystectomy; and melanoma removal.   Family History: family history includes Alzheimer's disease in her brother; Cancer in her mother.   Social History:  reports that she quit smoking about 52 years ago. Her smoking use included cigarettes. She has never used smokeless tobacco. She reports that she does not drink alcohol and does not use drugs.   Current Medications: has a current medication list which includes the following prescription(s): amlodipine, atorvastatin, cholecalciferol, glipizide, hydrochlorothiazide, omega-3 fatty acids-fish oil, oxybutynin, temazepam, aspirin, benzonatate, cyclobenzaprine, erythromycin, ferrous sulfate, gabapentin, levofloxacin, metronidazole, neomycin, tramadol, trazodone, and triamcinolone.   Allergies:  Allergies       Allergies  Allergen Reactions   Cephalosporins Unknown   Codeine Unknown   Doxycycline Rash   Macrolide Antibiotics Unknown      "upset stomach"   Nitrofurantoin Monohyd/M-Cryst Other (See Comments)   Penicillins Unknown   Sulfa (Sulfonamide Antibiotics) Unknown   Tramadol Nausea        ROS:  A 15 point review of systems was performed and pertinent positives and negatives noted in HPI   Objective:      Objective BP 133/77    Pulse 101   Ht 157.5 cm (5' 2.01")   Wt 60.1 kg (132 lb 7.9 oz)   LMP  (LMP Unknown)   BMI 24.23 kg/m    Constitutional :  No distress, cooperative, alert  Lymphatics/Throat:  Supple with no lymphadenopathy  Respiratory:  Clear to auscultation bilaterally  Cardiovascular:  Regular rate and rhythm  Gastrointestinal: Soft, non-tender, non-distended, no organomegaly.  Musculoskeletal: Steady gait and movement  Skin: Cool and moist  Psychiatric: Normal affect, non-agitated, not confused         LABS:    CEA- pending   RADS: Component 6 d ago  SURGICAL PATHOLOGY SURGICAL PATHOLOGY CASE: [id number] PATIENT: [name] Surgical Pathology Report     Specimen Submitted: A. Colon polypoid, ileocecal valve; cbx B. Colon polyp, descending; cold snare  Clinical History: IDA.  Diverticulosis, ileocecal valve lesion, colon polyp    DIAGNOSIS: A. COLON, ILEOCECAL VALVE, POLYPOID LESION; COLD BIOPSY: - INVASIVE MODERATELY DIFFERENTIATED ADENOCARCINOMA.  Comment: DNA mismatch repair testing will be deferred to the final resection, but may be performed on this specimen at request.  B. COLON POLYP, DESCENDING; COLD SNARE: - TUBULAR ADENOMA. - NEGATIVE FOR HIGH-GRADE DYSPLASIA AND MALIGNANCY.  Comment: A single fragment of mucosal tissue in part B displays changes compatible with at least high grade dysplasia, and appears similar to that seen in source A. Similar changes are not seen elsewhere in sampling of the descending colon polyp. Benign tissue carryover is favored.  GROSS DESCRIPTION: A. Labeled: cbx polypoid lesion ileocecal valve colon Received: Formalin Collection time: 1:46 PM on 02/17/2023 Placed into formalin time: 1:46 PM on 02/17/2023 Tissue fragment(s): Multiple Size: Aggregate, 1.7  x 0.5 x 0.2 cm Description: Received are fragments of tan-pink soft tissue admixed with intestinal debris.  The ratio of soft tissue to intestinal debris is  95: 5. Entirely submitted in 1 cassette.  B. Labeled: Cold snare polyp descending colon Received: Formalin Collection time: 1:55 PM on 02/17/2023 Placed into formalin time: 1:55 PM on 02/17/2023 Tissue fragment(s): 4 Size: Aggregate, 1 x 0.7 x 0.4 cm Description: Received are fragments of tan soft tissue.  The 2 largest fragments have resection margins which are differentially inked.  These fragments are sectioned. Entirely submitted in cassettes 1-3 with 1 bisected fragment in cassette 1, 1 serially sectioned fragment in cassette 2, and the remaining fragments in cassette 3.  RB 02/18/2023  Final Diagnosis performed by Katherine Mantle, MD.   Electronically signed 02/19/2023 9:22:28AM The electronic signature indicates that the named Attending Pathologist has evaluated the specimen Technical component performed at [facility], [address], [place], [place] [address] Lab: [phone] Dir: Jolene Schimke, MD, MMM  Professional component performed at [facility], [address] [place], [place], [place] [address] Lab: [phone] Dir: Beryle Quant, MD      Assessment:        Assessment Cancer of ascending colon (CMS/HHS-HCC) [C18.2] Lung CA- s/p radiation treatment.   Plan:        Plan Discussed pathophisiology of colon CA in depth.  The risk of laparoscopic colon resection surgery includes, but not limited to, recurrence, bleeding, chronic pain, post-op infxn, post-op SBO or ileus, hernias, resection of bowel, re-anastamosis, possible ostomy placement and need for re-operation to address said risks. The risks of general anesthetic, if used, includes MI, CVA, sudden death or even reaction to anesthetic medications also discussed. Alternatives include continued observation.  Benefits include possible symptom relief, preventing further decline in health and possible death.   Typical post-op recovery time of additional days in hospital for observation afterwards also  discussed.   Prep ordered.  Will proceed with ERAS protocol as well.    The patient and family members verbalized understanding and all questions were answered to the patient's satisfaction.  Pt and family understands she continues to be high risk of perioperative complications.   labs/images/medications/previous chart entries reviewed personally and relevant changes/updates noted above.

## 2023-06-01 NOTE — H&P (View-Only) (Signed)
Subjective:    CC: Cancer of ascending colon (CMS/HHS-HCC) [C18.2]   HPI:   Subjective [name] is a 89 y.o. female who returns for evaluation of above. CT for mets workup noted lung nodule, bx proven primary lung CA. She is s/p radiation with report of good response and prognosis, ok to proceed with resection of colon cancer.  No new concerns today.   Previous report of Blood noted in stool along with weakness.   Past Medical History:  has a past medical history of Aortic valvular stenoses, Carotid artery stenosis, Diverticulosis, Hyperlipidemia, Hypertension, and Stroke (CMS/HHS-HCC).   Past Surgical History:  has a past surgical history that includes laminectomy lumbar spine; Appendectomy; Cholecystectomy; and melanoma removal.   Family History: family history includes Alzheimer's disease in her brother; Cancer in her mother.   Social History:  reports that she quit smoking about 52 years ago. Her smoking use included cigarettes. She has never used smokeless tobacco. She reports that she does not drink alcohol and does not use drugs.   Current Medications: has a current medication list which includes the following prescription(s): amlodipine, atorvastatin, cholecalciferol, glipizide, hydrochlorothiazide, omega-3 fatty acids-fish oil, oxybutynin, temazepam, aspirin, benzonatate, cyclobenzaprine, erythromycin, ferrous sulfate, gabapentin, levofloxacin, metronidazole, neomycin, tramadol, trazodone, and triamcinolone.   Allergies:  Allergies       Allergies  Allergen Reactions   Cephalosporins Unknown   Codeine Unknown   Doxycycline Rash   Macrolide Antibiotics Unknown      "upset stomach"   Nitrofurantoin Monohyd/M-Cryst Other (See Comments)   Penicillins Unknown   Sulfa (Sulfonamide Antibiotics) Unknown   Tramadol Nausea        ROS:  A 15 point review of systems was performed and pertinent positives and negatives noted in HPI   Objective:      Objective BP 133/77    Pulse 101   Ht 157.5 cm (5' 2.01")   Wt 60.1 kg (132 lb 7.9 oz)   LMP  (LMP Unknown)   BMI 24.23 kg/m    Constitutional :  No distress, cooperative, alert  Lymphatics/Throat:  Supple with no lymphadenopathy  Respiratory:  Clear to auscultation bilaterally  Cardiovascular:  Regular rate and rhythm  Gastrointestinal: Soft, non-tender, non-distended, no organomegaly.  Musculoskeletal: Steady gait and movement  Skin: Cool and moist  Psychiatric: Normal affect, non-agitated, not confused         LABS:    CEA- pending   RADS: Component 6 d ago  SURGICAL PATHOLOGY SURGICAL PATHOLOGY CASE: [id number] PATIENT: [name] Surgical Pathology Report     Specimen Submitted: A. Colon polypoid, ileocecal valve; cbx B. Colon polyp, descending; cold snare  Clinical History: IDA.  Diverticulosis, ileocecal valve lesion, colon polyp    DIAGNOSIS: A. COLON, ILEOCECAL VALVE, POLYPOID LESION; COLD BIOPSY: - INVASIVE MODERATELY DIFFERENTIATED ADENOCARCINOMA.  Comment: DNA mismatch repair testing will be deferred to the final resection, but may be performed on this specimen at request.  B. COLON POLYP, DESCENDING; COLD SNARE: - TUBULAR ADENOMA. - NEGATIVE FOR HIGH-GRADE DYSPLASIA AND MALIGNANCY.  Comment: A single fragment of mucosal tissue in part B displays changes compatible with at least high grade dysplasia, and appears similar to that seen in source A. Similar changes are not seen elsewhere in sampling of the descending colon polyp. Benign tissue carryover is favored.  GROSS DESCRIPTION: A. Labeled: cbx polypoid lesion ileocecal valve colon Received: Formalin Collection time: 1:46 PM on 02/17/2023 Placed into formalin time: 1:46 PM on 02/17/2023 Tissue fragment(s): Multiple Size: Aggregate, 1.7  x 0.5 x 0.2 cm Description: Received are fragments of tan-pink soft tissue admixed with intestinal debris.  The ratio of soft tissue to intestinal debris is  95: 5. Entirely submitted in 1 cassette.  B. Labeled: Cold snare polyp descending colon Received: Formalin Collection time: 1:55 PM on 02/17/2023 Placed into formalin time: 1:55 PM on 02/17/2023 Tissue fragment(s): 4 Size: Aggregate, 1 x 0.7 x 0.4 cm Description: Received are fragments of tan soft tissue.  The 2 largest fragments have resection margins which are differentially inked.  These fragments are sectioned. Entirely submitted in cassettes 1-3 with 1 bisected fragment in cassette 1, 1 serially sectioned fragment in cassette 2, and the remaining fragments in cassette 3.  RB 02/18/2023  Final Diagnosis performed by Katherine Mantle, MD.   Electronically signed 02/19/2023 9:22:28AM The electronic signature indicates that the named Attending Pathologist has evaluated the specimen Technical component performed at [facility], [address], [place], [place] [address] Lab: [phone] Dir: Jolene Schimke, MD, MMM  Professional component performed at [facility], [address] [place], [place], [place] [address] Lab: [phone] Dir: Beryle Quant, MD      Assessment:        Assessment Cancer of ascending colon (CMS/HHS-HCC) [C18.2] Lung CA- s/p radiation treatment.   Plan:        Plan Discussed pathophisiology of colon CA in depth.  The risk of laparoscopic colon resection surgery includes, but not limited to, recurrence, bleeding, chronic pain, post-op infxn, post-op SBO or ileus, hernias, resection of bowel, re-anastamosis, possible ostomy placement and need for re-operation to address said risks. The risks of general anesthetic, if used, includes MI, CVA, sudden death or even reaction to anesthetic medications also discussed. Alternatives include continued observation.  Benefits include possible symptom relief, preventing further decline in health and possible death.   Typical post-op recovery time of additional days in hospital for observation afterwards also  discussed.   Prep ordered.  Will proceed with ERAS protocol as well.    The patient and family members verbalized understanding and all questions were answered to the patient's satisfaction.  Pt and family understands she continues to be high risk of perioperative complications.   labs/images/medications/previous chart entries reviewed personally and relevant changes/updates noted above.

## 2023-06-07 DIAGNOSIS — C182 Malignant neoplasm of ascending colon: Secondary | ICD-10-CM | POA: Diagnosis not present

## 2023-06-07 DIAGNOSIS — J984 Other disorders of lung: Secondary | ICD-10-CM | POA: Diagnosis not present

## 2023-06-14 ENCOUNTER — Encounter
Admission: RE | Admit: 2023-06-14 | Discharge: 2023-06-14 | Disposition: A | Discharge status: Home / Self Care | Payer: Medicare Other | Source: Ambulatory Visit | Attending: Surgery | Admitting: Surgery

## 2023-06-14 VITALS — Ht 62.0 in | Wt 132.5 lb

## 2023-06-14 DIAGNOSIS — E119 Type 2 diabetes mellitus without complications: Secondary | ICD-10-CM

## 2023-06-14 DIAGNOSIS — Z8673 Personal history of transient ischemic attack (TIA), and cerebral infarction without residual deficits: Secondary | ICD-10-CM

## 2023-06-14 DIAGNOSIS — I10 Essential (primary) hypertension: Secondary | ICD-10-CM

## 2023-06-14 DIAGNOSIS — Z01818 Encounter for other preprocedural examination: Secondary | ICD-10-CM

## 2023-06-14 DIAGNOSIS — Z01812 Encounter for preprocedural laboratory examination: Secondary | ICD-10-CM

## 2023-06-14 DIAGNOSIS — I350 Nonrheumatic aortic (valve) stenosis: Secondary | ICD-10-CM

## 2023-06-14 NOTE — Patient Instructions (Addendum)
Your procedure is scheduled on:06-21-23 Monday Report to the Registration Desk on the 1st floor of the [facility].Then proceed to the 2nd floor Surgery Desk To find out your arrival time, please call [phone] between 1PM - 3PM on:06-18-23 Friday If your arrival time is 6:00 am, do not arrive before that time as the [facility] entrance doors do not open until 6:00 am.  REMEMBER: Instructions that are not followed completely may result in serious medical risk, up to and including death; or upon the discretion of your surgeon and anesthesiologist your surgery may need to be rescheduled.  Do not eat food after midnight the night before surgery.  No gum chewing or hard candies.  You may however, drink Water up to 2 hours before you are scheduled to arrive for your surgery. Do not drink anything within 2 hours of your scheduled arrival time..  One week prior to surgery:Last dose 06-14-23 Stop Anti-inflammatories (NSAIDS) such as Advil, Aleve, Ibuprofen, Motrin, Naproxen, Naprosyn and Aspirin based products such as Excedrin, Goody's Powder, BC Powder.You may however, take Tylenol if needed for pain up until the day of surgery. Stop ANY OVER THE COUNTER supplements/vitamins NOW (06-14-23) until after surgery (Vitamin D)   Continue taking all prescribed medications  TAKE ONLY THESE MEDICATIONS THE MORNING OF SURGERY WITH A SIP OF WATER: -amLODipine (NORVASC)  -atorvastatin (LIPITOR)   Follow Bowel Prep as directed from Dr. Geoffery Lyons office   No Alcohol for 24 hours before or after surgery.  No Smoking including e-cigarettes for 24 hours before surgery.  No chewable tobacco products for at least 6 hours before surgery.  No nicotine patches on the day of surgery.  Do not use any "recreational" drugs for at least a week (preferably 2 weeks) before your surgery.  Please be advised that the combination of cocaine and anesthesia may have negative outcomes, up to and including death. If you test  positive for cocaine, your surgery will be cancelled.  On the morning of surgery brush your teeth with toothpaste and water, you may rinse your mouth with mouthwash if you wish. Do not swallow any toothpaste or mouthwash.  Use CHG Soap as directed on instruction sheet.  Do not wear jewelry, make-up, hairpins, clips or nail polish.  Do not wear lotions, powders, or perfumes.   Do not shave body hair from the neck down 48 hours before surgery.  Contact lenses, hearing aids and dentures may not be worn into surgery.  Do not bring valuables to the hospital. [facility] is not responsible for any missing/lost belongings or valuables.   Notify your doctor if there is any change in your medical condition (cold, fever, infection).  Wear comfortable clothing (specific to your surgery type) to the hospital.  After surgery, you can help prevent lung complications by doing breathing exercises.  Take deep breaths and cough every 1-2 hours. Your doctor may order a device called an Incentive Spirometer to help you take deep breaths. When coughing or sneezing, hold a pillow firmly against your incision with both hands. This is called "splinting." Doing this helps protect your incision. It also decreases belly discomfort. If you are being admitted to the hospital overnight, leave your suitcase in the car. After surgery it may be brought to your room.  In case of increased patient census, it may be necessary for you, the patient, to continue your postoperative care in the Same Day Surgery department.  If you are being discharged the day of surgery, you will  not be allowed to drive home. You will need a responsible individual to drive you home and stay with you for 24 hours after surgery.   If you are taking public transportation, you will need to have a responsible individual with you.  Please call the Pre-admissions Testing Dept. at [phone] if you have any questions about these  instructions.  Surgery Visitation Policy:  Patients having surgery or a procedure may have two visitors.  Children under the age of 87 must have an adult with them who is not the patient.  Inpatient Visitation:    Visiting hours are 7 a.m. to 8 p.m. Up to four visitors are allowed at one time in a patient room. The visitors may rotate out with other people during the day.  One visitor age 87 or older may stay with the patient overnight and must be in the room by 8 p.m.      Preparing for Surgery with CHLORHEXIDINE GLUCONATE (CHG) Soap  Chlorhexidine Gluconate (CHG) Soap  o An antiseptic cleaner that kills germs and bonds with the skin to continue killing germs even after washing  o Used for showering the night before surgery and morning of surgery  Before surgery, you can play an important role by reducing the number of germs on your skin.  CHG (Chlorhexidine gluconate) soap is an antiseptic cleanser which kills germs and bonds with the skin to continue killing germs even after washing.  Please do not use if you have an allergy to CHG or antibacterial soaps. If your skin becomes reddened/irritated stop using the CHG.  1. Shower the NIGHT BEFORE SURGERY and the MORNING OF SURGERY with CHG soap.  2. If you choose to wash your hair, wash your hair first as usual with your normal shampoo.  3. After shampooing, rinse your hair and body thoroughly to remove the shampoo.  4. Use CHG as you would any other liquid soap. You can apply CHG directly to the skin and wash gently with a scrungie or a clean washcloth.  5. Apply the CHG soap to your body only from the neck down. Do not use on open wounds or open sores. Avoid contact with your eyes, ears, mouth, and genitals (private parts). Wash face and genitals (private parts) with your normal soap.  6. Wash thoroughly, paying special attention to the area where your surgery will be performed.  7. Thoroughly rinse your body with warm  water.  8. Do not shower/wash with your normal soap after using and rinsing off the CHG soap.  9. Pat yourself dry with a clean towel.  10. Wear clean pajamas to bed the night before surgery.  12. Place clean sheets on your bed the night of your first shower and do not sleep with pets.  13. Shower again with the CHG soap on the day of surgery prior to arriving at the hospital.  14. Do not apply any deodorants/lotions/powders.  15. Please wear clean clothes to the hospital.

## 2023-06-15 ENCOUNTER — Encounter: Payer: Self-pay | Admitting: Surgery

## 2023-06-16 ENCOUNTER — Encounter
Admission: RE | Admit: 2023-06-16 | Discharge: 2023-06-16 | Disposition: A | Discharge status: Home / Self Care | Payer: Medicare Other | Source: Ambulatory Visit | Attending: Surgery | Admitting: Surgery

## 2023-06-16 DIAGNOSIS — Z8673 Personal history of transient ischemic attack (TIA), and cerebral infarction without residual deficits: Secondary | ICD-10-CM | POA: Diagnosis not present

## 2023-06-16 DIAGNOSIS — I350 Nonrheumatic aortic (valve) stenosis: Secondary | ICD-10-CM | POA: Insufficient documentation

## 2023-06-16 DIAGNOSIS — Z01812 Encounter for preprocedural laboratory examination: Secondary | ICD-10-CM | POA: Diagnosis not present

## 2023-06-16 DIAGNOSIS — I10 Essential (primary) hypertension: Secondary | ICD-10-CM | POA: Insufficient documentation

## 2023-06-16 DIAGNOSIS — E119 Type 2 diabetes mellitus without complications: Secondary | ICD-10-CM | POA: Diagnosis not present

## 2023-06-16 LAB — BASIC METABOLIC PANEL WITH GFR
Anion gap: 8 (ref 5–15)
BUN: 12 mg/dL (ref 8–23)
CO2: 27 mmol/L (ref 22–32)
Calcium: 9.2 mg/dL (ref 8.9–10.3)
Chloride: 99 mmol/L (ref 98–111)
Creatinine, Ser: 0.77 mg/dL (ref 0.44–1.00)
GFR, Estimated: >60 mL/min
Glucose, Bld: 347 mg/dL — ABNORMAL HIGH (ref 70–99)
Potassium: 3.6 mmol/L (ref 3.5–5.1)
Sodium: 134 mmol/L — ABNORMAL LOW (ref 135–145)

## 2023-06-16 LAB — CBC
HCT: 41 % (ref 36.0–46.0)
Hemoglobin: 13.9 g/dL (ref 12.0–15.0)
MCH: 29.8 pg (ref 26.0–34.0)
MCHC: 33.9 g/dL (ref 30.0–36.0)
MCV: 88 fL (ref 80.0–100.0)
Platelets: 150 K/uL (ref 150–400)
RBC: 4.66 MIL/uL (ref 3.87–5.11)
RDW: 13.3 % (ref 11.5–15.5)
WBC: 6.3 K/uL (ref 4.0–10.5)
nRBC: 0 % (ref 0.0–0.2)

## 2023-06-16 LAB — TYPE AND SCREEN
ABO/RH(D): A NEG
Antibody Screen: NEGATIVE

## 2023-06-20 MED ORDER — SODIUM CHLORIDE 0.9 % IV SOLN: INTRAVENOUS | Status: DC

## 2023-06-20 MED ORDER — GABAPENTIN 300 MG PO CAPS
300.0000 mg | ORAL_CAPSULE | ORAL | Status: AC
  Administered 2023-06-21: 300 mg via ORAL

## 2023-06-20 MED ORDER — ACETAMINOPHEN 500 MG PO TABS
1000.0000 mg | ORAL_TABLET | ORAL | Status: AC
  Administered 2023-06-21: 1000 mg via ORAL

## 2023-06-20 MED ORDER — SODIUM CHLORIDE 0.9 % IV SOLN
2.0000 g | INTRAVENOUS | Status: AC
  Administered 2023-06-21: 2 g via INTRAVENOUS

## 2023-06-20 MED ORDER — SODIUM CHLORIDE 0.9 % IV SOLN: 2.0000 g | Freq: Once | INTRAVENOUS | Status: DC

## 2023-06-20 MED ORDER — ORAL CARE MOUTH RINSE: 15.0000 mL | Freq: Once | OROMUCOSAL | Status: AC

## 2023-06-20 MED ORDER — CHLORHEXIDINE GLUCONATE CLOTH 2 % EX PADS: 6.0000 | MEDICATED_PAD | Freq: Once | CUTANEOUS | Status: DC

## 2023-06-20 MED ORDER — CHLORHEXIDINE GLUCONATE 0.12 % MT SOLN
15.0000 mL | Freq: Once | OROMUCOSAL | Status: AC
  Administered 2023-06-21: 15 mL via OROMUCOSAL

## 2023-06-21 ENCOUNTER — Encounter
Admission: RE | Disposition: A | Discharge status: Home Health | Payer: Self-pay | Source: Home / Self Care | Attending: Surgery

## 2023-06-21 ENCOUNTER — Inpatient Hospital Stay
Admission: RE | Admit: 2023-06-21 | Discharge: 2023-06-26 | DRG: 330 | Disposition: A | Discharge status: Home Health | Payer: Medicare Other | Attending: Surgery | Admitting: Surgery

## 2023-06-21 ENCOUNTER — Encounter: Payer: Self-pay | Admitting: Surgery

## 2023-06-21 ENCOUNTER — Other Ambulatory Visit: Payer: Self-pay

## 2023-06-21 ENCOUNTER — Inpatient Hospital Stay: Payer: Medicare Other | Admitting: Urgent Care

## 2023-06-21 DIAGNOSIS — Z79899 Other long term (current) drug therapy: Secondary | ICD-10-CM | POA: Diagnosis not present

## 2023-06-21 DIAGNOSIS — C3432 Malignant neoplasm of lower lobe, left bronchus or lung: Secondary | ICD-10-CM | POA: Diagnosis present

## 2023-06-21 DIAGNOSIS — Z885 Allergy status to narcotic agent status: Secondary | ICD-10-CM

## 2023-06-21 DIAGNOSIS — Z85820 Personal history of malignant melanoma of skin: Secondary | ICD-10-CM

## 2023-06-21 DIAGNOSIS — Z7984 Long term (current) use of oral hypoglycemic drugs: Secondary | ICD-10-CM | POA: Diagnosis not present

## 2023-06-21 DIAGNOSIS — Z8673 Personal history of transient ischemic attack (TIA), and cerebral infarction without residual deficits: Secondary | ICD-10-CM | POA: Diagnosis not present

## 2023-06-21 DIAGNOSIS — Z923 Personal history of irradiation: Secondary | ICD-10-CM | POA: Diagnosis not present

## 2023-06-21 DIAGNOSIS — Z881 Allergy status to other antibiotic agents status: Secondary | ICD-10-CM | POA: Diagnosis not present

## 2023-06-21 DIAGNOSIS — E119 Type 2 diabetes mellitus without complications: Secondary | ICD-10-CM | POA: Diagnosis present

## 2023-06-21 DIAGNOSIS — E785 Hyperlipidemia, unspecified: Secondary | ICD-10-CM | POA: Diagnosis present

## 2023-06-21 DIAGNOSIS — Z882 Allergy status to sulfonamides status: Secondary | ICD-10-CM

## 2023-06-21 DIAGNOSIS — Z880 Allergy status to penicillin: Secondary | ICD-10-CM | POA: Diagnosis not present

## 2023-06-21 DIAGNOSIS — Z820 Family history of epilepsy and other diseases of the nervous system: Secondary | ICD-10-CM | POA: Diagnosis not present

## 2023-06-21 DIAGNOSIS — I739 Peripheral vascular disease, unspecified: Secondary | ICD-10-CM | POA: Diagnosis not present

## 2023-06-21 DIAGNOSIS — I110 Hypertensive heart disease with heart failure: Secondary | ICD-10-CM | POA: Diagnosis not present

## 2023-06-21 DIAGNOSIS — I5032 Chronic diastolic (congestive) heart failure: Secondary | ICD-10-CM | POA: Diagnosis present

## 2023-06-21 DIAGNOSIS — Z809 Family history of malignant neoplasm, unspecified: Secondary | ICD-10-CM

## 2023-06-21 DIAGNOSIS — C189 Malignant neoplasm of colon, unspecified: Secondary | ICD-10-CM | POA: Diagnosis not present

## 2023-06-21 DIAGNOSIS — R140 Abdominal distension (gaseous): Secondary | ICD-10-CM | POA: Diagnosis not present

## 2023-06-21 DIAGNOSIS — Z888 Allergy status to other drugs, medicaments and biological substances status: Secondary | ICD-10-CM | POA: Diagnosis not present

## 2023-06-21 DIAGNOSIS — D126 Benign neoplasm of colon, unspecified: Secondary | ICD-10-CM | POA: Diagnosis present

## 2023-06-21 DIAGNOSIS — C182 Malignant neoplasm of ascending colon: Secondary | ICD-10-CM | POA: Diagnosis not present

## 2023-06-21 DIAGNOSIS — Z9049 Acquired absence of other specified parts of digestive tract: Secondary | ICD-10-CM | POA: Diagnosis not present

## 2023-06-21 DIAGNOSIS — Z85038 Personal history of other malignant neoplasm of large intestine: Secondary | ICD-10-CM | POA: Diagnosis not present

## 2023-06-21 DIAGNOSIS — C180 Malignant neoplasm of cecum: Secondary | ICD-10-CM | POA: Diagnosis not present

## 2023-06-21 DIAGNOSIS — Z87891 Personal history of nicotine dependence: Secondary | ICD-10-CM | POA: Diagnosis not present

## 2023-06-21 DIAGNOSIS — Z01818 Encounter for other preprocedural examination: Secondary | ICD-10-CM

## 2023-06-21 LAB — CBC
HCT: 39.5 % (ref 36.0–46.0)
Hemoglobin: 13.1 g/dL (ref 12.0–15.0)
MCH: 29.3 pg (ref 26.0–34.0)
MCHC: 33.2 g/dL (ref 30.0–36.0)
MCV: 88.4 fL (ref 80.0–100.0)
Platelets: 149 10*3/uL — ABNORMAL LOW (ref 150–400)
RBC: 4.47 MIL/uL (ref 3.87–5.11)
RDW: 13.2 % (ref 11.5–15.5)
WBC: 9.8 10*3/uL (ref 4.0–10.5)
nRBC: 0 % (ref 0.0–0.2)

## 2023-06-21 LAB — GLUCOSE, CAPILLARY
Glucose-Capillary: 211 mg/dL — ABNORMAL HIGH (ref 70–99)
Glucose-Capillary: 334 mg/dL — ABNORMAL HIGH (ref 70–99)
Glucose-Capillary: 295 mg/dL — ABNORMAL HIGH (ref 70–99)
Glucose-Capillary: 255 mg/dL — ABNORMAL HIGH (ref 70–99)
Glucose-Capillary: 206 mg/dL — ABNORMAL HIGH (ref 70–99)

## 2023-06-21 LAB — CREATININE, SERUM
Creatinine, Ser: 0.93 mg/dL (ref 0.44–1.00)
GFR, Estimated: 58 mL/min — ABNORMAL LOW (ref >60)

## 2023-06-21 SURGERY — COLECTOMY, RIGHT, ROBOT-ASSISTED
Anesthesia: General | Site: Abdomen | Laterality: Right

## 2023-06-21 MED ORDER — SUGAMMADEX SODIUM 500 MG/5ML IV SOLN
INTRAVENOUS | Status: DC | PRN
  Administered 2023-06-21: 200 mg via INTRAVENOUS

## 2023-06-21 MED ORDER — ONDANSETRON HCL 4 MG/2ML IJ SOLN
4.0000 mg | Freq: Four times a day (QID) | INTRAMUSCULAR | Status: DC | PRN
  Administered 2023-06-21 – 2023-06-22 (×2): 4 mg via INTRAVENOUS
  Filled 2023-06-21 (×2): qty 2

## 2023-06-21 MED ORDER — LIDOCAINE HCL (CARDIAC) PF 100 MG/5ML IV SOSY
PREFILLED_SYRINGE | INTRAVENOUS | Status: DC | PRN
  Administered 2023-06-21: 50 mg via INTRAVENOUS

## 2023-06-21 MED ORDER — INDOCYANINE GREEN 25 MG IV SOLR
INTRAVENOUS | Status: DC | PRN
  Administered 2023-06-21: 5 mg via INTRAVENOUS

## 2023-06-21 MED ORDER — OXYCODONE HCL 5 MG/5ML PO SOLN: 5.0000 mg | Freq: Once | ORAL | Status: AC | PRN

## 2023-06-21 MED ORDER — DEXAMETHASONE SODIUM PHOSPHATE 10 MG/ML IJ SOLN
INTRAMUSCULAR | Status: AC
  Filled 2023-06-21: qty 1

## 2023-06-21 MED ORDER — FENTANYL CITRATE (PF) 100 MCG/2ML IJ SOLN
INTRAMUSCULAR | Status: AC
  Filled 2023-06-21: qty 2

## 2023-06-21 MED ORDER — DEXAMETHASONE SODIUM PHOSPHATE 10 MG/ML IJ SOLN
INTRAMUSCULAR | Status: DC | PRN
  Administered 2023-06-21: 5 mg via INTRAVENOUS

## 2023-06-21 MED ORDER — CHLORHEXIDINE GLUCONATE 0.12 % MT SOLN
OROMUCOSAL | Status: AC
  Filled 2023-06-21: qty 15

## 2023-06-21 MED ORDER — BUPIVACAINE HCL (PF) 0.5 % IJ SOLN
INTRAMUSCULAR | Status: DC | PRN
  Administered 2023-06-21: 30 mL

## 2023-06-21 MED ORDER — KETAMINE HCL 10 MG/ML IJ SOLN
INTRAMUSCULAR | Status: DC | PRN
Start: 2023-06-21 — End: 2023-06-21
  Administered 2023-06-21 (×2): 25 mg via INTRAVENOUS

## 2023-06-21 MED ORDER — FENTANYL CITRATE (PF) 100 MCG/2ML IJ SOLN
INTRAMUSCULAR | Status: DC | PRN
  Administered 2023-06-21: 12.5 ug via INTRAVENOUS
  Administered 2023-06-21: 25 ug via INTRAVENOUS
  Administered 2023-06-21: 12.5 ug via INTRAVENOUS
  Administered 2023-06-21: 25 ug via INTRAVENOUS

## 2023-06-21 MED ORDER — DEXMEDETOMIDINE HCL IN NACL 80 MCG/20ML IV SOLN
INTRAVENOUS | Status: DC | PRN
  Administered 2023-06-21: 12 ug via INTRAVENOUS

## 2023-06-21 MED ORDER — ONDANSETRON HCL 4 MG/2ML IJ SOLN
INTRAMUSCULAR | Status: AC
  Filled 2023-06-21: qty 2

## 2023-06-21 MED ORDER — BUPIVACAINE HCL (PF) 0.5 % IJ SOLN
INTRAMUSCULAR | Status: AC
  Filled 2023-06-21: qty 30

## 2023-06-21 MED ORDER — INSULIN ASPART 100 UNIT/ML IJ SOLN
0.0000 [IU] | Freq: Three times a day (TID) | INTRAMUSCULAR | Status: DC
  Administered 2023-06-21: 8 [IU] via SUBCUTANEOUS
  Administered 2023-06-22 – 2023-06-23 (×4): 3 [IU] via SUBCUTANEOUS
  Administered 2023-06-23: 2 [IU] via SUBCUTANEOUS
  Administered 2023-06-23: 3 [IU] via SUBCUTANEOUS
  Administered 2023-06-24: 5 [IU] via SUBCUTANEOUS
  Administered 2023-06-24: 3 [IU] via SUBCUTANEOUS
  Administered 2023-06-24: 2 [IU] via SUBCUTANEOUS
  Administered 2023-06-25: 3 [IU] via SUBCUTANEOUS
  Administered 2023-06-25: 5 [IU] via SUBCUTANEOUS
  Administered 2023-06-25: 3 [IU] via SUBCUTANEOUS
  Administered 2023-06-26: 8 [IU] via SUBCUTANEOUS
  Administered 2023-06-26: 3 [IU] via SUBCUTANEOUS
  Filled 2023-06-21 (×16): qty 1

## 2023-06-21 MED ORDER — KETAMINE HCL 50 MG/5ML IJ SOSY
PREFILLED_SYRINGE | INTRAMUSCULAR | Status: AC
  Filled 2023-06-21: qty 5

## 2023-06-21 MED ORDER — EPHEDRINE SULFATE (PRESSORS) 50 MG/ML IJ SOLN
INTRAMUSCULAR | Status: DC | PRN
  Administered 2023-06-21: 5 mg via INTRAVENOUS
  Administered 2023-06-21: 10 mg via INTRAVENOUS
  Administered 2023-06-21: 5 mg via INTRAVENOUS

## 2023-06-21 MED ORDER — ONDANSETRON HCL 4 MG/2ML IJ SOLN
INTRAMUSCULAR | Status: DC | PRN
  Administered 2023-06-21: 4 mg via INTRAVENOUS

## 2023-06-21 MED ORDER — SODIUM CHLORIDE 0.9 % IV SOLN
INTRAVENOUS | Status: AC
  Filled 2023-06-21: qty 2

## 2023-06-21 MED ORDER — FENTANYL CITRATE (PF) 100 MCG/2ML IJ SOLN
25.0000 ug | INTRAMUSCULAR | Status: DC | PRN
  Administered 2023-06-21 (×4): 25 ug via INTRAVENOUS

## 2023-06-21 MED ORDER — ROCURONIUM BROMIDE 100 MG/10ML IV SOLN
INTRAVENOUS | Status: DC | PRN
  Administered 2023-06-21: 10 mg via INTRAVENOUS
  Administered 2023-06-21: 40 mg via INTRAVENOUS
  Administered 2023-06-21: 10 mg via INTRAVENOUS

## 2023-06-21 MED ORDER — ACETAMINOPHEN 500 MG PO TABS
ORAL_TABLET | ORAL | Status: AC
  Filled 2023-06-21: qty 2

## 2023-06-21 MED ORDER — INSULIN ASPART 100 UNIT/ML IJ SOLN
5.0000 [IU] | Freq: Once | INTRAMUSCULAR | Status: AC
  Administered 2023-06-21: 5 [IU] via SUBCUTANEOUS

## 2023-06-21 MED ORDER — ENOXAPARIN SODIUM 40 MG/0.4ML IJ SOSY
40.0000 mg | PREFILLED_SYRINGE | INTRAMUSCULAR | Status: DC
  Administered 2023-06-22 – 2023-06-26 (×5): 40 mg via SUBCUTANEOUS
  Filled 2023-06-21 (×5): qty 0.4

## 2023-06-21 MED ORDER — PHENYLEPHRINE HCL-NACL 20-0.9 MG/250ML-% IV SOLN
INTRAVENOUS | Status: DC | PRN
  Administered 2023-06-21: 10 ug/min via INTRAVENOUS

## 2023-06-21 MED ORDER — ACETAMINOPHEN 325 MG PO TABS
650.0000 mg | ORAL_TABLET | Freq: Four times a day (QID) | ORAL | Status: DC
  Administered 2023-06-21: 650 mg via ORAL
  Administered 2023-06-21: 325 mg via ORAL
  Administered 2023-06-22 – 2023-06-24 (×9): 650 mg via ORAL
  Filled 2023-06-21 (×11): qty 2

## 2023-06-21 MED ORDER — OXYCODONE HCL 5 MG PO TABS
5.0000 mg | ORAL_TABLET | Freq: Once | ORAL | Status: AC | PRN
  Administered 2023-06-21: 5 mg via ORAL

## 2023-06-21 MED ORDER — SODIUM CHLORIDE 0.9 % IV SOLN: INTRAVENOUS | Status: DC

## 2023-06-21 MED ORDER — PHENYLEPHRINE HCL-NACL 20-0.9 MG/250ML-% IV SOLN
INTRAVENOUS | Status: AC
  Filled 2023-06-21: qty 250

## 2023-06-21 MED ORDER — HYDROMORPHONE HCL 1 MG/ML IJ SOLN
0.5000 mg | INTRAMUSCULAR | Status: DC | PRN
  Administered 2023-06-21 – 2023-06-22 (×5): 0.5 mg via INTRAVENOUS
  Filled 2023-06-21 (×5): qty 0.5

## 2023-06-21 MED ORDER — ONDANSETRON 4 MG PO TBDP: 4.0000 mg | ORAL_TABLET | Freq: Four times a day (QID) | ORAL | Status: DC | PRN

## 2023-06-21 MED ORDER — ORAL CARE MOUTH RINSE: 15.0000 mL | OROMUCOSAL | Status: DC | PRN

## 2023-06-21 MED ORDER — LIDOCAINE HCL (PF) 2 % IJ SOLN
INTRAMUSCULAR | Status: AC
  Filled 2023-06-21: qty 5

## 2023-06-21 MED ORDER — FAMOTIDINE 20 MG PO TABS
20.0000 mg | ORAL_TABLET | Freq: Once | ORAL | Status: AC
  Administered 2023-06-21: 20 mg via ORAL

## 2023-06-21 MED ORDER — SODIUM CHLORIDE (PF) 0.9 % IJ SOLN
INTRAMUSCULAR | Status: AC
  Filled 2023-06-21: qty 50

## 2023-06-21 MED ORDER — PROPOFOL 10 MG/ML IV BOLUS
INTRAVENOUS | Status: AC
  Filled 2023-06-21: qty 20

## 2023-06-21 MED ORDER — KETOROLAC TROMETHAMINE 15 MG/ML IJ SOLN
15.0000 mg | Freq: Four times a day (QID) | INTRAMUSCULAR | Status: DC | PRN
  Administered 2023-06-21: 15 mg via INTRAVENOUS
  Filled 2023-06-21: qty 1

## 2023-06-21 MED ORDER — PROPOFOL 10 MG/ML IV BOLUS
INTRAVENOUS | Status: DC | PRN
Start: 2023-06-21 — End: 2023-06-21
  Administered 2023-06-21: 15 mg via INTRAVENOUS
  Administered 2023-06-21: 50 mg via INTRAVENOUS

## 2023-06-21 MED ORDER — METOPROLOL TARTRATE 5 MG/5ML IV SOLN
INTRAVENOUS | Status: AC
  Filled 2023-06-21: qty 5

## 2023-06-21 MED ORDER — FAMOTIDINE 20 MG PO TABS
ORAL_TABLET | ORAL | Status: AC
  Filled 2023-06-21: qty 1

## 2023-06-21 MED ORDER — METOPROLOL TARTRATE 5 MG/5ML IV SOLN
INTRAVENOUS | Status: DC | PRN
  Administered 2023-06-21: 1 mg via INTRAVENOUS

## 2023-06-21 MED ORDER — TEMAZEPAM 7.5 MG PO CAPS
15.0000 mg | ORAL_CAPSULE | Freq: Every evening | ORAL | Status: DC | PRN
  Administered 2023-06-21 – 2023-06-25 (×5): 15 mg via ORAL
  Filled 2023-06-21 (×5): qty 2

## 2023-06-21 MED ORDER — INSULIN ASPART 100 UNIT/ML IJ SOLN
INTRAMUSCULAR | Status: AC
  Filled 2023-06-21: qty 1

## 2023-06-21 MED ORDER — EPHEDRINE 5 MG/ML INJ
INTRAVENOUS | Status: AC
  Filled 2023-06-21: qty 5

## 2023-06-21 MED ORDER — BUPIVACAINE LIPOSOME 1.3 % IJ SUSP
INTRAMUSCULAR | Status: AC
  Filled 2023-06-21: qty 20

## 2023-06-21 MED ORDER — LIDOCAINE HCL (PF) 2 % IJ SOLN
INTRAMUSCULAR | Status: AC
  Filled 2023-06-21: qty 10

## 2023-06-21 MED ORDER — HYDROCHLOROTHIAZIDE 12.5 MG PO TABS
12.5000 mg | ORAL_TABLET | Freq: Every day | ORAL | Status: DC
  Administered 2023-06-22 – 2023-06-26 (×5): 12.5 mg via ORAL
  Filled 2023-06-21 (×5): qty 1

## 2023-06-21 MED ORDER — GABAPENTIN 300 MG PO CAPS
ORAL_CAPSULE | ORAL | Status: AC
  Filled 2023-06-21: qty 1

## 2023-06-21 MED ORDER — ATORVASTATIN CALCIUM 20 MG PO TABS
40.0000 mg | ORAL_TABLET | Freq: Every evening | ORAL | Status: DC
  Administered 2023-06-21 – 2023-06-25 (×5): 40 mg via ORAL
  Filled 2023-06-21 (×5): qty 2

## 2023-06-21 MED ORDER — ROCURONIUM BROMIDE 10 MG/ML (PF) SYRINGE
PREFILLED_SYRINGE | INTRAVENOUS | Status: AC
  Filled 2023-06-21: qty 10

## 2023-06-21 MED ORDER — SODIUM CHLORIDE 0.9 % IV SOLN
INTRAVENOUS | Status: DC | PRN
  Administered 2023-06-21: 70 mL

## 2023-06-21 MED ORDER — AMLODIPINE BESYLATE 10 MG PO TABS
10.0000 mg | ORAL_TABLET | Freq: Every day | ORAL | Status: DC
  Administered 2023-06-22 – 2023-06-26 (×4): 10 mg via ORAL
  Filled 2023-06-21 (×5): qty 1

## 2023-06-21 MED ORDER — OXYCODONE HCL 5 MG PO TABS
ORAL_TABLET | ORAL | Status: AC
  Filled 2023-06-21: qty 1

## 2023-06-21 MED ORDER — PANTOPRAZOLE SODIUM 40 MG IV SOLR
40.0000 mg | Freq: Every day | INTRAVENOUS | Status: DC
  Administered 2023-06-21 – 2023-06-23 (×3): 40 mg via INTRAVENOUS
  Filled 2023-06-21 (×3): qty 10

## 2023-06-21 MED ORDER — GLYCOPYRROLATE 0.2 MG/ML IJ SOLN
INTRAMUSCULAR | Status: AC
  Filled 2023-06-21: qty 1

## 2023-06-21 SURGICAL SUPPLY — 92 items
ADH SKN CLS APL DERMABOND .7 (GAUZE/BANDAGES/DRESSINGS)
BASIN KIT SINGLE STR (MISCELLANEOUS) ×1 IMPLANT
BLADE CLIPPER SURG (BLADE) ×1 IMPLANT
BLADE SURG SZ11 CARB STEEL (BLADE) ×1 IMPLANT
BULB RESERV EVAC DRAIN JP 100C (MISCELLANEOUS) IMPLANT
COVER TIP SHEARS 8 DVNC (MISCELLANEOUS) ×1 IMPLANT
DERMABOND ADVANCED .7 DNX12 (GAUZE/BANDAGES/DRESSINGS) IMPLANT
DRAIN CHANNEL JP 15F RND 16 (MISCELLANEOUS) IMPLANT
DRAPE ARM DVNC X/XI (DISPOSABLE) ×4 IMPLANT
DRAPE COLUMN DVNC XI (DISPOSABLE) ×1 IMPLANT
DRAPE LEGGINS SURG 28X43 STRL (DRAPES) ×1 IMPLANT
DRSG OPSITE POSTOP 4X10 (GAUZE/BANDAGES/DRESSINGS) IMPLANT
DRSG OPSITE POSTOP 4X8 (GAUZE/BANDAGES/DRESSINGS) IMPLANT
ELECT BLADE 6.5 EXT (BLADE) IMPLANT
ELECT CAUTERY BLADE 6.4 (BLADE) IMPLANT
ELECT REM PT RETURN 9FT ADLT (ELECTROSURGICAL) ×1
ELECTRODE REM PT RTRN 9FT ADLT (ELECTROSURGICAL) ×1 IMPLANT
FORCEPS BPLR 8 MD DVNC XI (FORCEP) ×1 IMPLANT
FORCEPS BPLR FENES DVNC XI (FORCEP) ×1 IMPLANT
GLOVE BIOGEL PI IND STRL 7.0 (GLOVE) ×3 IMPLANT
GLOVE SURG SYN 6.5 ES PF (GLOVE) ×6 IMPLANT
GLOVE SURG SYN 6.5 PF PI (GLOVE) ×3 IMPLANT
GOWN STRL REUS W/ TWL LRG LVL3 (GOWN DISPOSABLE) ×6 IMPLANT
GOWN STRL REUS W/TWL LRG LVL3 (GOWN DISPOSABLE) ×6
GRASPER SUT TROCAR 14GX15 (MISCELLANEOUS) IMPLANT
GRASPER TIP-UP FEN DVNC XI (INSTRUMENTS) ×1 IMPLANT
HANDLE YANKAUER SUCT BULB TIP (MISCELLANEOUS) ×1 IMPLANT
IRRIGATION STRYKERFLOW (MISCELLANEOUS) IMPLANT
IRRIGATOR STRYKERFLOW (MISCELLANEOUS)
IRRIGATOR SUCT 8 DISP DVNC XI (IRRIGATION / IRRIGATOR) IMPLANT
IV NS 1000ML (IV SOLUTION)
IV NS 1000ML BAXH (IV SOLUTION) IMPLANT
KIT PINK PAD W/HEAD ARE REST (MISCELLANEOUS) ×1
KIT PINK PAD W/HEAD ARM REST (MISCELLANEOUS) ×1 IMPLANT
LABEL OR SOLS (LABEL) IMPLANT
MANIFOLD NEPTUNE II (INSTRUMENTS) ×1 IMPLANT
NDL DRIVE SUT CUT DVNC (INSTRUMENTS) ×1 IMPLANT
NDL HYPO 22X1.5 SAFETY MO (MISCELLANEOUS) ×1 IMPLANT
NDL INSUFFLATION 14GA 120MM (NEEDLE) ×1 IMPLANT
NEEDLE DRIVE SUT CUT DVNC (INSTRUMENTS) ×1 IMPLANT
NEEDLE HYPO 22X1.5 SAFETY MO (MISCELLANEOUS) ×1 IMPLANT
NEEDLE INSUFFLATION 14GA 120MM (NEEDLE) ×1 IMPLANT
OBTURATOR OPTICAL STND 8 DVNC (TROCAR) ×1
OBTURATOR OPTICALSTD 8 DVNC (TROCAR) ×1 IMPLANT
PACK COLON CLEAN CLOSURE (MISCELLANEOUS) ×1 IMPLANT
PACK LAP CHOLECYSTECTOMY (MISCELLANEOUS) ×1 IMPLANT
PENCIL SMOKE EVACUATOR (MISCELLANEOUS) ×1 IMPLANT
PORT ACCESS TROCAR AIRSEAL 5 (TROCAR) ×1 IMPLANT
RELOAD STAPLE 45 3.5 BLU DVNC (STAPLE) IMPLANT
RELOAD STAPLE 60 3.5 BLU DVNC (STAPLE) IMPLANT
RELOAD STAPLER 3.5X45 BLU DVNC (STAPLE) IMPLANT
RELOAD STAPLER 3.5X60 BLU DVNC (STAPLE) ×3 IMPLANT
RETRACTOR RING XSMALL (MISCELLANEOUS) ×1 IMPLANT
RETRACTOR WOUND ALXS 18CM MED (MISCELLANEOUS) ×1 IMPLANT
RTRCTR WOUND ALEXIS 13CM XS SH (MISCELLANEOUS) ×1
RTRCTR WOUND ALEXIS O 18CM MED (MISCELLANEOUS)
SCISSORS MNPLR CVD DVNC XI (INSTRUMENTS) ×1 IMPLANT
SEAL UNIV 5-12 XI (MISCELLANEOUS) ×3 IMPLANT
SEALER VESSEL EXT DVNC XI (MISCELLANEOUS) IMPLANT
SET BI-LUMEN FLTR TB AIRSEAL (TUBING) ×1 IMPLANT
SET TRI-LUMEN FLTR TB AIRSEAL (TUBING) ×1 IMPLANT
SLEEVE Z-THREAD 5X100MM (TROCAR) ×1 IMPLANT
SOL ELECTROSURG ANTI STICK (MISCELLANEOUS) ×1
SOLUTION ELECTROSURG ANTI STCK (MISCELLANEOUS) ×1 IMPLANT
SPONGE T-LAP 18X18 ~~LOC~~+RFID (SPONGE) ×1 IMPLANT
STAPLER 45 SUREFORM DVNC (STAPLE) IMPLANT
STAPLER 60 SUREFORM DVNC (STAPLE) IMPLANT
STAPLER RELOAD 3.5X45 BLU DVNC (STAPLE)
STAPLER RELOAD 3.5X60 BLU DVNC (STAPLE) ×3
STAPLER SKIN PROX 35W (STAPLE) IMPLANT
SUT ETHILON 3-0 FS-10 30 BLK (SUTURE) ×1
SUT MNCRL 4-0 (SUTURE) ×2
SUT MNCRL 4-0 27XMFL (SUTURE) ×2
SUT MNCRL AB 4-0 PS2 18 (SUTURE) ×1 IMPLANT
SUT PDS AB 1 CT1 36 (SUTURE) ×3 IMPLANT
SUT SILK 3 0 (SUTURE) ×1
SUT SILK 3 0 SH 30 (SUTURE) IMPLANT
SUT SILK 3-0 18XBRD TIE 12 (SUTURE) IMPLANT
SUT VIC AB 3-0 SH 27 (SUTURE) ×5
SUT VIC AB 3-0 SH 27X BRD (SUTURE) ×4 IMPLANT
SUT VICRYL 0 UR6 27IN ABS (SUTURE) ×1 IMPLANT
SUT VLOC 90 0 VL 30X27 GL22 (SUTURE) IMPLANT
SUT VLOC 90 6 CV-15 VIOLET (SUTURE) ×1 IMPLANT
SUTURE EHLN 3-0 FS-10 30 BLK (SUTURE) IMPLANT
SUTURE MNCRL 4-0 27XMF (SUTURE) ×2 IMPLANT
SYR 30ML LL (SYRINGE) ×2 IMPLANT
SYS LAPSCP GELPORT 120MM (MISCELLANEOUS)
SYSTEM LAPSCP GELPORT 120MM (MISCELLANEOUS) ×1 IMPLANT
SYSTEM WECK SHIELD CLOSURE (TROCAR) IMPLANT
TRAP FLUID SMOKE EVACUATOR (MISCELLANEOUS) ×1 IMPLANT
TRAY FOLEY MTR SLVR 16FR STAT (SET/KITS/TRAYS/PACK) ×1 IMPLANT
WATER STERILE IRR 500ML POUR (IV SOLUTION) ×1 IMPLANT

## 2023-06-21 NOTE — Interval H&P Note (Signed)
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  The various methods of treatment have been discussed with the patient and family. After consideration of risks, benefits and other options for treatment, the patient has consented to  Procedure(s): XI ROBOT ASSISTED RIGHT COLECTOMY (Right) as a surgical intervention.  The patient's history has been reviewed, patient examined, no change in status, stable for surgery.  I have reviewed the patient's chart and labs.  Questions were answered to the patient's satisfaction.      Tonna Boehringer

## 2023-06-21 NOTE — Anesthesia Preprocedure Evaluation (Addendum)
Anesthesia Evaluation  Patient identified by MRN, date of birth, ID band Patient awake    Reviewed: Allergy & Precautions, NPO status , Patient's Chart, lab work & pertinent test results  History of Anesthesia Complications (+) PONV and history of anesthetic complications  Airway Mallampati: III  TM Distance: >3 FB Neck ROM: full    Dental no notable dental hx.    Pulmonary former smoker LLL pulmonary nodule    Pulmonary exam normal        Cardiovascular hypertension, On Medications + Peripheral Vascular Disease (s/p L CEA) and +CHF  + dysrhythmias Supra Ventricular Tachycardia + Valvular Problems/Murmurs AS   EKG 02/15/24 Sinus rhythm Multiple premature complexes, vent & supraven Inferior infarct, old Baseline wander in lead(s) II III aVL aVF V2  Echo 2022  INTERPRETATION  NORMAL LEFT VENTRICULAR SYSTOLIC FUNCTION   WITH MODERATE LVH  NORMAL RIGHT VENTRICULAR SYSTOLIC FUNCTION  MILD VALVULAR REGURGITATION (See above)  MODERATE VALVULAR STENOSIS (See above)  AVA(VTI)= .99cm^2  MODERATE AS  TRIVIAL TR  MILD AR, MR  EF >55%  Closest EF: >55% (Estimated)  Aortic: MILD AR  Mitral: MILD MR  Tricuspid: TRIVIAL TR  LVH: MODERATE LVH  Dias.FxClass: (Grade 1) relaxation abnormal, E/A reversal  AVS: MODERATE AS   Per Cardiology 02/2024 Plan   Preoperative clearance for upcoming colectomy on 03/16/2023 with Dr. Tonna Boehringer. Patient is cleared from a cardiac standpoint, is at moderate risk.  EKG today revealed normal sinus rhythm, left anterior fascicular block, inferior infarct with a ventricular rate of 96 bpm. Non-rheumatic aortic valve stenosis, continue conservative management. Recommend echo for further evaluation. Carotid artery stenosis, stable, continue atorvastatin, recommend carotid ultrasound.  Hypertension, well controlled, continue current medical therapy with HCTZ and amlodipine  Hyperlipidemia, continue atorvastatin  for lipid management Type 2 DM, on glipizide, management per PCP H/o TIA, management per neurology Have patient follow up in 6 months  Return in about 6 months (around 09/10/2023).      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)     Comment:  a.) Bx (+) for invasive moderately invasive               adenocarcinoma No date: Anemia No date: Aortic atherosclerosis (HCC) No date: Aortic stenosis     Comment:  a.) TTE 05/17/2017: mild-mod AS (MPG 21.3); b.) TTE               06/17/2018: mild-mod AS (MPG 25.6/ AVA = 1.3 cm^2); c.)               TTE 12/11/2019: mod AS (MPG 19.3/ AVA = 0.90 cm^2); d.)               TTE 01/27/2021: mod AS (MPG 21.9/ AVA = 0.99 cm^2) 03/09/2023: Ascending aorta dilatation (HCC)     Comment:  a.) CT chest 03/09/2023: fusiform dilatation measuring               4.3 cm No date: Basal cell carcinoma (BCC) of left medial cheek     Comment:  a.) s/p Mohs surgery 07/13/2022 at [facility] No date: Bilateral carotid artery disease (HCC)     Comment:  a.) doppler 04/03/2007: 50-75%  RICA; b.) s/p LEFT CEA               with CorMatrix patch angioplasty 01/18/2015; c.) doppler               11/02/2016: 40-59% RICA; d.) doppler 01/10/2018: 1-39%               RICA 08/05/2015: Diverticulosis 06/10/2021: Eczema of both upper extremities No date: Heart failure with preserved left ventricular function  (HFpEF) (HCC)     Comment:  a.) TTE 05/17/2017: EF >55%, mild LVH, mild LAE, mild               panval regurg; b.) TTE 06/27/2018 : EF >55%, mod LVH,               mild LAE, mild panval regurg'; G1dd; c.) TTE 12/07/2019:               EF >55%, mild LVH, mild MAC, mild panval regurg; d.)  TTE               01/27/2021: EF >55%, mod LVH, triv TR, mild AR/MR, G1DD No date: Heart murmur No date: History of right cataract extraction No date: Hyperlipidemia No date: Hypertension No date: Insomnia     Comment:  a.) on BZO (temazepam) PRN 04/20/2017: Irritable bowel syndrome with diarrhea 08/05/2015: LBP (low back pain) No date: Melanoma (HCC)     Comment:  a.) s/p Mohs surgery 02/06/2020 - LEFT medial brow --> V              to Y advancement flap No date: Melena 03/09/2023: Non-small cell lung cancer (NSCLC) (HCC)     Comment:  a.) CT chest 03/09/2023 --> superior segment LLL --> 2.1              x 1.7 x 2.1 cm mixed ground-glass and subpleural nodule               extending to pleura with irreg margins --> concerning for              primary bronchogenic carcinoma rather than metastatic               disease; b.) 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               (60 Gy over 6 fractions) No date: OAB (overactive bladder) No date: PONV (postoperative nausea and vomiting)     Comment:  a.) single episode following cataract surgery 02/22/2015: PSVT (paroxysmal supraventricular tachycardia)     Comment:  a.) holter 02/22/2015: 6 runs with longest lasting 10               beats 04/02/2007: Right pontine stroke [facility])     Comment:  a.) MRI brain 04/02/2007 - subacute RIGHT pontine CVA 12/16/2014: Stroke [facility])     Comment:  a.) MRI brain 12/16/2014: punctate foci of restricted               diffusion x 5 consistent with microembolic infarctions               affecting the RIGHT lateral medulla, LEFT temporal lobe,               2 foci in the RIGHT thalamus, and LEFT inferior  frontal               lobe/basal ganglia --> distribution would suggest cardiac  or ascending aortic source No date: T2DM (type 2 diabetes mellitus) (HCC) No date: Tubular adenoma of colon  Past Surgical History: No date: APPENDECTOMY No date: BREAST BIOPSY; Right     Comment:   neg No date: BREAST SURGERY     Comment:  1980's 01/18/2015: CAROTID ENDARTERECTOMY; Left     Comment:  Procedure: CAROTID ENDARTERECTOMY; Location: [facility];               Surgeon: [name], MD 2012: CATARACT EXTRACTION; Right 06/27/2012: CHOLECYSTECTOMY 02/17/2023: COLONOSCOPY WITH PROPOFOL; N/A     Comment:  Procedure: COLONOSCOPY WITH PROPOFOL;  Surgeon:               Regis Bill, MD;  Location: [facility] ENDOSCOPY;                Service: Endoscopy;  Laterality: N/A; 1950: DECOMPRESSIVE LUMBAR LAMINECTOMY LEVEL 1 02/16/2023: ESOPHAGOGASTRODUODENOSCOPY (EGD) WITH PROPOFOL; N/A     Comment:  Procedure: ESOPHAGOGASTRODUODENOSCOPY (EGD) WITH               PROPOFOL;  Surgeon: Regis Bill, MD;  Location:               [facility] ENDOSCOPY;  Service: Endoscopy;  Laterality: N/A; 02/06/2020: MELANOMA EXCISION; Left     Comment:  Procedure: MOHS SURGERY WITH V TO Y ADVANCEMENT FLAP               (LEFT MEDIAL EYEBROW); Location: [facility] 07/13/2022: MOHS SURGERY; Left     Comment:  Procedure: MOHS SURGERY (BCC LEFT MEDIAL CHEEK);               Location: [facility]  BMI    Body Mass Index: 24.14 kg/m      Reproductive/Obstetrics negative OB ROS                              Anesthesia Physical Anesthesia Plan  ASA: 3  Anesthesia Plan: General ETT   Post-op Pain Management: Tylenol PO (pre-op)*, Toradol IV (intra-op)* and Ketamine IV*   Induction: Intravenous  PONV Risk Score and Plan: 4 or greater and Ondansetron, Dexamethasone, Midazolam and Treatment may vary due to age or medical condition  Airway Management Planned: Oral ETT  Additional Equipment:   Intra-op Plan:   Post-operative Plan: Extubation in OR  Informed Consent: I have reviewed the patients History and Physical, chart, labs and discussed the procedure including the risks, benefits and alternatives for the proposed anesthesia with the patient or authorized representative who has  indicated his/her understanding and acceptance.     Dental Advisory Given  Plan Discussed with: Anesthesiologist, CRNA and Surgeon  Anesthesia Plan Comments: (Patient consented for risks of anesthesia including but not limited to:  - adverse reactions to medications - damage to eyes, teeth, lips or other oral mucosa - nerve damage due to positioning  - sore throat or hoarseness - Damage to heart, brain, nerves, lungs, other parts of body or loss of life  Patient voiced understanding.)         Anesthesia Quick Evaluation

## 2023-06-21 NOTE — Anesthesia Postprocedure Evaluation (Signed)
Anesthesia Post Note  Patient: [name]  Procedure(s) Performed: XI ROBOT ASSISTED RIGHT COLECTOMY (Right: Abdomen)  Patient location during evaluation: PACU Anesthesia Type: General Level of consciousness: awake and alert Pain management: pain level controlled Vital Signs Assessment: post-procedure vital signs reviewed and stable Respiratory status: spontaneous breathing, nonlabored ventilation, respiratory function stable and patient connected to nasal cannula oxygen Cardiovascular status: blood pressure returned to baseline and stable Postop Assessment: no apparent nausea or vomiting Anesthetic complications: no   No notable events documented.   Last Vitals:  Vitals:   06/21/23 1235 06/21/23 1240  BP:    Pulse: 85 87  Resp: 15 17  Temp:    SpO2: 93% 94%    Last Pain:  Vitals:   06/21/23 1235  TempSrc:   PainSc: 4                  Louie Boston

## 2023-06-21 NOTE — Anesthesia Procedure Notes (Signed)
Procedure Name: Intubation Date/Time: 06/21/2023 8:33 AM  Performed by: Jeannene Patella, CRNAPre-anesthesia Checklist: Patient identified, Timeout performed, Emergency Drugs available, Suction available and Patient being monitored Patient Re-evaluated:Patient Re-evaluated prior to induction Oxygen Delivery Method: Circle system utilized Preoxygenation: Pre-oxygenation with 100% oxygen Induction Type: IV induction Ventilation: Mask ventilation without difficulty and Oral airway inserted - appropriate to patient size Laryngoscope Size: McGraph and 4 Grade View: Grade II Tube type: Oral Tube size: 7.0 mm Number of attempts: 1 Airway Equipment and Method: Stylet, LTA kit utilized and Video-laryngoscopy Placement Confirmation: ETT inserted through vocal cords under direct vision, positive ETCO2 and breath sounds checked- equal and bilateral Secured at: 21 (lip) cm Tube secured with: Tape Dental Injury: Teeth and Oropharynx as per pre-operative assessment  Comments: Left front upper tooth large chip, right front upper tooth small chips patient & family aware preop Remain unchanged s/p intubation

## 2023-06-21 NOTE — Plan of Care (Signed)
  Problem: Education: Goal: Ability to describe self-care measures that may prevent or decrease complications (Diabetes Survival Skills Education) will improve Outcome: Progressing Goal: Individualized Educational Video(s) Outcome: Progressing   

## 2023-06-21 NOTE — Op Note (Signed)
Preoperative diagnosis: Right colon cancer  Postoperative diagnosis: Same  Procedure: Robotic assisted laparoscopic right colectomy.   Anesthesia: GETA   Surgeon: Sung Amabile   Wound Classification: clean contaminated   Specimen: Right colon   Complications: None   Estimated Blood Loss: 80 mL  Indications:Please see H&P for further details.     FIndings: 1.  Right colon mass within cecum area as previously noted 2.  Adequate hemostasis.  3.  No gross metastasis noted  Operation performed with curative intent:Yes  Tumor Location:Cecum  Extent of colon and vascular resection:Right hemicolectomy - ileocolic, right colic present   Description of procedure: The patient was placed on the operating table in the supine position, both arms tucked. General anesthesia was induced. A time-out was completed verifying correct patient, procedure, site, positioning, and implant(s) and/or special equipment prior to beginning this procedure. The abdomen was prepped and draped in the usual sterile fashion.    Palmer's point located and Veress needle was inserted.  After confirming 2 clicks and a positive saline drop test, gas insufflation was initiated until the abdominal pressure was measured at 10 mmHg due to patient having extensively elastic and thin abdominal wall.  Periumbilical incision made and 8 millimeter port placed via Optiview technique.  Inspection of the abdominal cavity afterwards noted no injury to organs around the area as well as around the Veress needle site.  3 additional incision was made 8 cm apart along the left side of the abdominal wall from the initial incision and two 8mm and one 12mm port placed under direct visualization. No injuries from trocar placements were noted.  Tap block with Exparel performed under direct visualization.  The table was placed in the reverse Trendelenburg position with the right side elevated.  Xi robotic platform was then brought to the operative  field and docked at an angle from the left lower quadrant.  Tip up grasper and hook cautery was placed in right arm ports.  Fenestrated bipolar in left arm port.   Examination of the abdominal cavity noted no signs of gross metastasis.  Adhesions noted along the cecum but no obvious tumor invasion outside the serosa.  Dissection was started by removing the lateral attachments of the right colon along the white line of Toldt, ensuring the right ureter was not involved.  The exteremly fatty right colon was grasped and elevated to visualize mesentary. Point was chosen on the transverse colon for staple transection, measuring at least 5 cm from the mass.  60mm blue load stapler was then used to transect the colon at this point.  Blunt dissection carried from the medial to lateral aspect in the avascular area towards the white line of Toldt.  Duodenum visualized and swept posterior to avoid injury.  Vessel sealer was then used to transect the right colon mesentery towards a previously determined point on the terminal ileum, care taken to ensure as much mesentery was taken for lymph node evaluation, and also visualizing the duodenum and placing it away from area of transection during this portion.  Once the terminal ileum was reached, 60mm blue load stapler was used to transect the terminal ileum.    The transected specimen was placed atop the liver.   ICG was then infused and the staple lines were confirmed to have adequate blood flow. The terminal ileum was then brought towards the transverse colon.  Excess omentum and epiploic fat on the transverse colon side transected in preparation for anastomosis creation and a isoperistaltic anastomosis was created.  Enterotomies were made in the small bowel and the transverse colon, small bowel enterotomy created 2 cm from the staple line and transverse colon enterotomy made 8 cm from the staple line.  60 mm blue load stapler placed through these enterotomies and new  anastomosis created.  Serosal tear noted on the colon side afterwards.  3-0 silk used to approximate the serosal tear and portion of this proximal small bowel wall sutured on top in a Lembert fashion for extra reinforcement using the same silk.  The enterotomy was then closed by placing 2 anchor sutures at the 2 apexes using 2-0 Vicryl, then running 3-0 V-Lock in a 2 layer fashion.  15 French round drain then placed through the right lower quadrant port site and secured to skin using 3-0 nylon.   No bleeding or additional pathology was noted.  Robot was then undocked.  The 12 mm port removed and the site extended until the colon specimen was able to be removed from the abdominal cavity intact.  The trimmed epiploic appendage and omental content was also removed at the same time.  Small arterial bleed in the mesentery was noted in the remaining transverse colon after removal of the specimen.  This was suture-ligated with 3-0 silk prior to reducing it back into the abdominal cavity.  No additional bleeding was noted upon visual inspection.    The remaining port sites were removed, the abdomen was allowed to collapse. 12mm port site was then closed primarily using 0 Vicryl x2. Deep dermal then closed with 3-0 vicryl interrupted fashion.  Remaining port site and skin closed with staples, dressed with honeycomb dressing.  Drain site covered with drain sponge and secured with paper tape.   The patient tolerated the procedure well, awakened from anesthesia and was taken to the postanesthesia care unit in satisfactory condition.  Foley still in place.  Sponge count and instrument count correct at the end of the procedure.

## 2023-06-21 NOTE — Transfer of Care (Signed)
Immediate Anesthesia Transfer of Care Note  Patient: [name]  Procedure(s) Performed: XI ROBOT ASSISTED RIGHT COLECTOMY (Right: Abdomen)  Patient Location: PACU  Anesthesia Type:General  Level of Consciousness: drowsy, patient cooperative, and responds to stimulation  Airway & Oxygen Therapy: Patient Spontanous Breathing and Patient connected to face mask oxygen  Post-op Assessment: Report given to RN and Post -op Vital signs reviewed and stable  Post vital signs: Reviewed and stable  Last Vitals:  Vitals Value Taken Time  BP 118/64 06/21/23 1201  Temp    Pulse 80 06/21/23 1207  Resp 17 06/21/23 1207  SpO2 100 % 06/21/23 1207  Vitals shown include unfiled device data.  Last Pain:  Vitals:   06/21/23 0706  TempSrc: Temporal  PainSc: 0-No pain         Complications: No notable events documented.

## 2023-06-22 LAB — GLUCOSE, CAPILLARY
Glucose-Capillary: 159 mg/dL — ABNORMAL HIGH (ref 70–99)
Glucose-Capillary: 166 mg/dL — ABNORMAL HIGH (ref 70–99)
Glucose-Capillary: 170 mg/dL — ABNORMAL HIGH (ref 70–99)
Glucose-Capillary: 145 mg/dL — ABNORMAL HIGH (ref 70–99)

## 2023-06-22 MED ORDER — CHLORHEXIDINE GLUCONATE CLOTH 2 % EX PADS: 6.0000 | MEDICATED_PAD | Freq: Every day | CUTANEOUS | Status: DC

## 2023-06-22 MED ORDER — KETOROLAC TROMETHAMINE 15 MG/ML IJ SOLN
15.0000 mg | Freq: Three times a day (TID) | INTRAMUSCULAR | Status: DC
  Administered 2023-06-22 – 2023-06-24 (×7): 15 mg via INTRAVENOUS
  Filled 2023-06-22 (×7): qty 1

## 2023-06-22 NOTE — Inpatient Diabetes Management (Signed)
Inpatient Diabetes Program Recommendations  AACE/ADA: New Consensus Statement on Inpatient Glycemic Control   Target Ranges:  Prepandial:   less than 140 mg/dL      Peak postprandial:   less than 180 mg/dL (1-2 hours)      Critically ill patients:  140 - 180 mg/dL    Latest Reference Range & Units 06/22/23 04:38  Glucose 70 - 99 mg/dL 366 (H)    Latest Reference Range & Units 06/21/23 07:10 06/21/23 12:05 06/21/23 12:50 06/21/23 16:27 06/21/23 20:56  Glucose-Capillary 70 - 99 mg/dL 440 (H) 347 (H) 425 (H) 255 (H) 206 (H)   Review of Glycemic Control  Diabetes history: DM2 Outpatient Diabetes medications: Glipizide XL 10 mg every other day Current orders for Inpatient glycemic control: Novolog 0-15 units TID with meals  Inpatient Diabetes Program Recommendations:    Insulin: Patient is currently NPO. If patient will remain NPO, please consider changing frequency of CBGs and Novolog correction to 0-15 units Q4H.  Thanks, Orlando Penner, RN, MSN, CDCES Diabetes Coordinator Inpatient Diabetes Program [phone] (Team Pager from 8am to 5pm)

## 2023-06-22 NOTE — TOC Initial Note (Signed)
Transition of Care [facility]) - Initial/Assessment Note    Patient Details  Name: [name] MRN: [id number] Date of Birth: 1929-03-27  Transition of Care [facility]) CM/SW Contact:    Margarito Liner, LCSW Phone Number: 06/22/2023, 3:03 PM  Clinical Narrative:  CSW met with patient. Daughter, [name], at bedside. CSW introduced role and explained that PT recommendations would be discussed. Patient is agreeable t home health. No agency preference. Set up with [facility]. Patient is agreeable to DME recommendation for RW. Will order closer to discharge. She stated she may not discharge until Saturday. No further concerns. CSW encouraged patient and daughter to contact CSW as needed. CSW will continue to follow patient and her daughter for support and facilitate return home once stable. [name] will transport her home at discharge.                Expected Discharge Plan: Home w Home Health Services Barriers to Discharge: Continued Medical Work up   Patient Goals and CMS Choice   CMS Medicare.gov Compare Post Acute Care list provided to:: Patient Choice offered to / list presented to : Patient, Adult Children      Expected Discharge Plan and Services     Post Acute Care Choice: Durable Medical Equipment, Home Health Living arrangements for the past 2 months: Single Family Home                           HH Arranged: PT HH Agency: [facility] Date HH Agency Contacted: 06/22/23   Representative spoke with at [facility] Agency: Feliberto Gottron  Prior Living Arrangements/Services Living arrangements for the past 2 months: Single Family Home Lives with:: Self Patient language and need for interpreter reviewed:: Yes Do you feel safe going back to the place where you live?: Yes      Need for Family Participation in Patient Care: Yes (Comment) Care giver support system in place?: Yes (comment)   Criminal Activity/Legal Involvement Pertinent to Current Situation/Hospitalization: No -  Comment as needed  Activities of Daily Living Home Assistive Devices/Equipment: Eyeglasses ADL Screening (condition at time of admission) Patient's cognitive ability adequate to safely complete daily activities?: Yes Is the patient deaf or have difficulty hearing?: No Does the patient have difficulty seeing, even when wearing glasses/contacts?: No Does the patient have difficulty concentrating, remembering, or making decisions?: No Patient able to express need for assistance with ADLs?: Yes Does the patient have difficulty dressing or bathing?: No Independently performs ADLs?: Yes (appropriate for developmental age) Does the patient have difficulty walking or climbing stairs?: No Weakness of Legs: None Weakness of Arms/Hands: None  Permission Sought/Granted Permission sought to share information with : Facility Medical sales representative, Family Supports Permission granted to share information with : Yes, Verbal Permission Granted  Share Information with NAME: Vernese Vanderpoel  Permission granted to share info w AGENCY: Home Health Agencies  Permission granted to share info w Relationship: Daughter  Permission granted to share info w Contact Information: [phone]  Emotional Assessment Appearance:: Appears stated age Attitude/Demeanor/Rapport: Engaged, Gracious Affect (typically observed): Accepting, Appropriate, Calm, Pleasant Orientation: : Oriented to Self, Oriented to Place, Oriented to  Time, Oriented to Situation Alcohol / Substance Use: Not Applicable Psych Involvement: No (comment)  Admission diagnosis:  Colon cancer [facility]) [C18.9] Patient Active Problem List   Diagnosis Date Noted   Colon cancer (HCC) 06/21/2023   Hospital discharge follow-up 02/24/2023   Malignant neoplasm of colon (HCC) 02/24/2023  Melena 02/15/2023   Acute blood loss anemia 02/15/2023   Chronic diastolic CHF (congestive heart failure) (HCC) 02/15/2023   Annual physical exam 07/06/2022   Allergic  conjunctivitis of both eyes 07/06/2022   Chronic idiopathic constipation 09/30/2021   Chronic bilateral low back pain without sciatica 09/30/2021   Encounter for Medicare annual wellness exam 06/10/2021   Candida rash of groin 06/10/2021   Vaginal dryness 06/10/2021   Murmur, cardiac 06/10/2021   Diabetes mellitus without complication (HCC) 04/15/2016   Moderate aortic stenosis 08/05/2015   Breast cyst 08/05/2015   Carotid artery narrowing 08/05/2015   History of CVA (cerebrovascular accident) 08/05/2015   Elevated CK 08/05/2015   Headache disorder 08/05/2015   Calcium blood increased 08/05/2015   HLD (hyperlipidemia) 08/05/2015   Essential hypertension 08/05/2015   Insomnia 08/05/2015   OP (osteoporosis) 08/05/2015   Adiposity 08/05/2015   Abnormal Pap smear of vagina 08/05/2015   Hemorrhage, postmenopausal 08/05/2015   Superficial thrombophlebitis 08/05/2015   FOM (frequency of micturition) 08/05/2015   Fatigue 08/05/2015   PCP:  Jacky Kindle, FNP Pharmacy:   [facility] [address] [place] - [address] [place] [address] Phone: [phone] Fax: [phone]     Social Determinants of Health (SDOH) Social History: SDOH Screenings   Food Insecurity: No Food Insecurity (06/21/2023)  Housing: Low Risk  (06/21/2023)  Transportation Needs: No Transportation Needs (06/21/2023)  Utilities: Not At Risk (06/21/2023)  Alcohol Screen: Low Risk  (05/09/2020)  Depression (PHQ2-9): Low Risk  (02/24/2023)  Financial Resource Strain: Low Risk  (05/09/2020)  Physical Activity: Inactive (05/09/2020)  Social Connections: Moderately Isolated (05/09/2020)  Stress: No Stress Concern Present (05/09/2020)  Tobacco Use: Medium Risk (06/21/2023)   SDOH Interventions:     Readmission Risk Interventions     No data to display

## 2023-06-22 NOTE — Progress Notes (Signed)
Physical Therapy Evaluation Patient Details Name: [name] MRN: [id number] DOB: 24-Aug-1929 Today's Date: 06/22/2023  History of Present Illness  [name] is a 89 y.o. 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   Clinical Impression  Patient supine in bed upon arrival. Patient verbalizing she had just laid down but agreeable to PT evaluation with encouragement. Prior to admission, patient reports she was IND with mobility and ADLs, and driving. Today, patient required Min Guard to [place] A with bed mobility, with verbal cues for technique to promote improved technique to avoid pain/discomfort in abdominal region. Patient able to stand and ambulate approx 8 ft in room with RW with CGA, limited due to fatigue this date. Patient will benefit from skilled PT services to address impairments (see below for additional details), and maximize functional mobility to return to PLOF. Patient left supine in bed with all needs in reach and family at bedside. Will continue to follow acutely.       If plan is discharge home, recommend the following: A little help with walking and/or transfers;A little help with bathing/dressing/bathroom;Help with stairs or ramp for entrance;Assist for transportation;Assistance with cooking/housework   Can travel by private Tax inspector (2 wheels)  Recommendations for Other Services       Functional Status Assessment Patient has had a recent decline in their functional status and demonstrates the ability to make significant improvements in function in a reasonable and predictable amount of time.     Precautions / Restrictions Precautions Precautions: None Restrictions Weight Bearing Restrictions: No      Mobility  Bed Mobility Overal bed mobility: Needs Assistance Bed Mobility: Sidelying to Sit, Sit to Sidelying   Sidelying to sit: Min guard     Sit to sidelying: Min  assist (assist required with BLE to get back into bed) General bed mobility comments: Educated on bed mobility technique to reduce strain to abdomen.    Transfers Overall transfer level: Needs assistance Equipment used: Rolling walker (2 wheels) Transfers: Sit to/from Stand Sit to Stand: Min guard           General transfer comment: CGA to stand from EOB, mild unsteadiness. Unsafe to trial without AD this date.    Ambulation/Gait Ambulation/Gait assistance: Min guard Gait Distance (Feet): 8 Feet Assistive device: Rolling walker (2 wheels) Gait Pattern/deviations: Step-through pattern Gait velocity: Decreased     General Gait Details: mild unsteadiness, reliant on AD and CGA for ambulation. Limited due to lethargy/fatigue this date.  Stairs            Wheelchair Mobility     Tilt Bed    Modified Rankin (Stroke Patients Only)       Balance Overall balance assessment: Needs assistance Sitting-balance support: Feet supported, No upper extremity supported Sitting balance-Leahy Scale: Good     Standing balance support: Bilateral upper extremity supported, During functional activity, Reliant on assistive device for balance Standing balance-Leahy Scale: Fair Standing balance comment: mild unsteadiness, reliant on AD on eval for stability                             Pertinent Vitals/Pain Pain Assessment Pain Assessment: 0-10 Pain Score: 2  Pain Location: Abdomen Pain Descriptors / Indicators: Sore Pain Intervention(s): Limited activity within patient's tolerance, Monitored during session    Home Living Family/patient expects to be discharged to:: Private  residence Living Arrangements: Alone Available Help at Discharge: Family (family members live next door to provide assist PRN) Type of Home: House Home Access: Level entry       Home Layout: Other (Comment) (split level; family/patient reports 2-3 steps to enter/exit den. No rails present but  able to grab onto door frame for support. Family member reports they are potentially looking to install rail for support) Home Equipment: None      Prior Function Prior Level of Function : Independent/Modified Independent;Driving;History of Falls (last six months)             Mobility Comments: Patient reports IND without use of AD. ADLs Comments: IND with ADLs per patient reports; was still driving.     Hand Dominance   Dominant Hand: Right    Extremity/Trunk Assessment        Lower Extremity Assessment Lower Extremity Assessment: Overall WFL for tasks assessed       Communication   Communication: No difficulties  Cognition Arousal/Alertness: Awake/alert Behavior During Therapy: WFL for tasks assessed/performed Overall Cognitive Status: Within Functional Limits for tasks assessed                                          General Comments      Exercises     Assessment/Plan    PT Assessment Patient needs continued PT services  PT Problem List Decreased activity tolerance;Decreased balance;Decreased mobility;Decreased knowledge of use of DME       PT Treatment Interventions DME instruction;Gait training;Stair training;Functional mobility training;Therapeutic activities;Therapeutic exercise;Balance training;Neuromuscular re-education    PT Goals (Current goals can be found in the Care Plan section)  Acute Rehab PT Goals Patient Stated Goal: Get back my independence PT Goal Formulation: With patient Time For Goal Achievement: 07/06/23 Potential to Achieve Goals: Good    Frequency Min 1X/week     Co-evaluation               AM-PAC PT "6 Clicks" Mobility  Outcome Measure Help needed turning from your back to your side while in a flat bed without using bedrails?: A Little Help needed moving from lying on your back to sitting on the side of a flat bed without using bedrails?: A Little Help needed moving to and from a bed to a chair  (including a wheelchair)?: A Little Help needed standing up from a chair using your arms (e.g., wheelchair or bedside chair)?: A Little Help needed to walk in hospital room?: A Little Help needed climbing 3-5 steps with a railing? : A Little 6 Click Score: 18    End of Session Equipment Utilized During Treatment: Gait belt Activity Tolerance: Patient tolerated treatment well Patient left: in bed;with call bell/phone within reach;with family/visitor present Nurse Communication: Mobility status PT Visit Diagnosis: Unsteadiness on feet (R26.81);Other abnormalities of gait and mobility (R26.89)    Time: 1300-1320 PT Time Calculation (min) (ACUTE ONLY): 20 min   Charges:   PT Evaluation $PT Eval Low Complexity: 1 Low   PT General Charges $$ ACUTE PT VISIT: 1 Visit         Creed Copper Fairly, PT, DPT 06/22/23 1:36 PM

## 2023-06-22 NOTE — Progress Notes (Signed)
Subjective:  CC: [name] is a 89 y.o. female  Hospital stay day 1, 1 Day Post-Op robotic assisted right hemicolectomy for cecal colon cancer  HPI: Complaints of pain and mostly in the right lower quadrant this a.m.  Small episode of emesis after taking some Tylenol last night.  No complaints of nausea since and has been tolerating a clear liquid diet so far.  No flatus or BM reported.  Foley removed this a.m.  Family reports she seems to be moving better this a.m.  ROS:  General: Denies weight loss, weight gain, fatigue, fevers, chills, and night sweats. Heart: Denies chest pain, palpitations, racing heart, irregular heartbeat, leg pain or swelling, and decreased activity tolerance. Respiratory: Denies breathing difficulty, shortness of breath, wheezing, cough, and sputum. GI: Denies change in appetite, heartburn, nausea, vomiting, constipation, diarrhea, and blood in stool. GU: Denies difficulty urinating, pain with urinating, urgency, frequency, blood in urine.   Objective:   Temp:  [97.4 F (36.3 C)-98.1 F (36.7 C)] 97.7 F (36.5 C) (08/06 0742) Pulse Rate:  [70-89] 70 (08/06 0742) Resp:  [7-20] 18 (08/06 0742) BP: (103-123)/(53-81) 121/58 (08/06 0742) SpO2:  [87 %-99 %] 97 % (08/06 0742)     Height: 5\' 2"  (157.5 cm) Weight: 59.9 kg BMI (Calculated): 24.14   Intake/Output this shift:   Intake/Output Summary (Last 24 hours) at 06/22/2023 1111 Last data filed at 06/22/2023 1051 Gross per 24 hour  Intake 2967.13 ml  Output 545 ml  Net 2422.13 ml    Constitutional :  alert, cooperative, appears stated age, and no distress  Respiratory:  clear to auscultation bilaterally  Cardiovascular:  regular rate and rhythm  Gastrointestinal: Soft, no guarding, focal tenderness to palpation right lower quadrant.  JP with serosanguineous drainage.  Incision sites clean dry and intact.  Minimal tenderness around incision sites. .   Skin: Cool and moist.   Psychiatric: Normal affect,  non-agitated, not confused       LABS:     Latest Ref Rng & Units 06/22/2023    4:38 AM 06/21/2023    2:35 PM 06/16/2023   10:27 AM  CMP  Glucose 70 - 99 mg/dL 540   086   BUN 8 - 23 mg/dL 11   12   Creatinine 7.61 - 1.00 mg/dL 9.50  9.32  6.71   Sodium 135 - 145 mmol/L 136   134   Potassium 3.5 - 5.1 mmol/L 3.7   3.6   Chloride 98 - 111 mmol/L 106   99   CO2 22 - 32 mmol/L 23   27   Calcium 8.9 - 10.3 mg/dL 8.3   9.2       Latest Ref Rng & Units 06/22/2023    4:38 AM 06/21/2023    2:35 PM 06/16/2023   10:27 AM  CBC  WBC 4.0 - 10.5 K/uL 8.5  9.8  6.3   Hemoglobin 12.0 - 15.0 g/dL 24.5  80.9  98.3   Hematocrit 36.0 - 46.0 % 36.4  39.5  41.0   Platelets 150 - 400 K/uL 133  149  150     RADS: N/a Assessment:   S/p 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  Labs and vital signs reassuring.  Will continue to monitor area for pain.  Continue clear liquid diet for now.  Monitor JP drain output as well.  PT eval.  labs/images/medications/previous chart entries reviewed personally and relevant changes/updates noted above.

## 2023-06-22 NOTE — Consult Note (Signed)
[facility] Customer service manager [facility]) Accountable Care Organization (ACO) [facility] Liaison Note  06/22/2023  [name] [birth year] [id number]  Location: [facility] RN Hospital Liaison screened the patient remotely at [facility].  Insurance: [facility] Medicare Advantage   [name] is a 89 y.o. female who is a Primary Care Patient of Jacky Kindle, FNP. The patient was screened for  readmission hospitalization with noted low risk score for unplanned readmission risk with 2 IP in 6 months.  The patient was assessed for potential [facility]) Care Management service needs for post hospital transition for care coordination. Review of patient's electronic medical record reveals patient was admitted for testing related to colon cancer. Pt followed heavily by oncology team.    [facility] Care Management/Population Health does not replace or interfere with any arrangements made by the Inpatient Transition of Care team.   For questions contact:   Elliot Cousin, RN, [facility] Liaison [place]   Population Health Office Hours MTWF  8:00 am-6:00 pm Off on Thursday [phone] mobile [phone] [Office toll free line] Office Hours are M-F 8:30 - 5 pm 24 hour nurse advise line [phone] [email or web address]  [email or web address]

## 2023-06-23 ENCOUNTER — Inpatient Hospital Stay: Payer: Medicare Other

## 2023-06-23 LAB — CREATININE, FLUID (PLEURAL, PERITONEAL, JP DRAINAGE): Creat, Fluid: 0.7 mg/dL

## 2023-06-23 LAB — GLUCOSE, CAPILLARY
Glucose-Capillary: 124 mg/dL — ABNORMAL HIGH (ref 70–99)
Glucose-Capillary: 162 mg/dL — ABNORMAL HIGH (ref 70–99)
Glucose-Capillary: 156 mg/dL — ABNORMAL HIGH (ref 70–99)
Glucose-Capillary: 168 mg/dL — ABNORMAL HIGH (ref 70–99)

## 2023-06-23 MED ORDER — POTASSIUM CHLORIDE 10 MEQ/100ML IV SOLN
10.0000 meq | INTRAVENOUS | Status: AC
  Administered 2023-06-23 (×4): 10 meq via INTRAVENOUS
  Filled 2023-06-23 (×4): qty 100

## 2023-06-23 MED ORDER — POTASSIUM CHLORIDE CRYS ER 20 MEQ PO TBCR
40.0000 meq | EXTENDED_RELEASE_TABLET | Freq: Once | ORAL | Status: AC
  Administered 2023-06-23: 40 meq via ORAL
  Filled 2023-06-23: qty 2

## 2023-06-23 NOTE — TOC Progression Note (Signed)
Transition of Care [facility]) - Progression Note    Patient Details  Name: [name] MRN: [id number] Date of Birth: [birth year]  Transition of Care (TOC) CM/SW Contact  Chapman Fitch, RN Phone Number: 06/23/2023, 11:22 AM  Clinical Narrative:     Clear liquid diet ordered Documented now on RA Will need RW for home ordered and delivered prior to dc  Expected Discharge Plan: Home w Home Health Services Barriers to Discharge: Continued Medical Work up  Expected Discharge Plan and Services     Post Acute Care Choice: Durable Medical Equipment, Home Health Living arrangements for the past 2 months: Single Family Home                           HH Arranged: PT [facility] Agency: [facility] Date HH Agency Contacted: 06/22/23   Representative spoke with at [facility] Agency: Feliberto Gottron   Social Determinants of Health (SDOH) Interventions SDOH Screenings   Food Insecurity: No Food Insecurity (06/21/2023)  Housing: Low Risk  (06/21/2023)  Transportation Needs: No Transportation Needs (06/21/2023)  Utilities: Not At Risk (06/21/2023)  Alcohol Screen: Low Risk  (05/09/2020)  Depression (PHQ2-9): Low Risk  (02/24/2023)  Financial Resource Strain: Low Risk  (05/09/2020)  Physical Activity: Inactive (05/09/2020)  Social Connections: Moderately Isolated (05/09/2020)  Stress: No Stress Concern Present (05/09/2020)  Tobacco Use: Medium Risk (06/21/2023)    Readmission Risk Interventions     No data to display

## 2023-06-23 NOTE — Progress Notes (Signed)
Subjective:  CC: [name] is a 89 y.o. 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  episode of nausea after dilaudid.  Not needing it anymore. Otherwise tolerating clears, BM recorded overnight.  ROS:  General: Denies weight loss, weight gain, fatigue, fevers, chills, and night sweats. Heart: Denies chest pain, palpitations, racing heart, irregular heartbeat, leg pain or swelling, and decreased activity tolerance. Respiratory: Denies breathing difficulty, shortness of breath, wheezing, cough, and sputum. GI: Denies change in appetite, heartburn, nausea, vomiting, constipation, diarrhea, and blood in stool. GU: Denies difficulty urinating, pain with urinating, urgency, frequency, blood in urine.   Objective:   Temp:  [97.7 F (36.5 C)-98.2 F (36.8 C)] 97.7 F (36.5 C) (08/07 0823) Pulse Rate:  [60-72] 60 (08/07 0823) Resp:  [18] 18 (08/07 0823) BP: (110-136)/(49-71) 133/66 (08/07 0823) SpO2:  [95 %-98 %] 98 % (08/07 0823)     Height: 5\' 2"  (157.5 cm) Weight: 59.9 kg BMI (Calculated): 24.14   Intake/Output this shift:   Intake/Output Summary (Last 24 hours) at 06/23/2023 1436 Last data filed at 06/23/2023 1408 Gross per 24 hour  Intake 1375.1 ml  Output 2230 ml  Net -854.9 ml    Constitutional :  alert, cooperative, appears stated age, and no distress  Respiratory:  clear to auscultation bilaterally  Cardiovascular:  regular rate and rhythm  Gastrointestinal: Soft, no guarding, focal tenderness to palpation right lower quadrant.  JP with serous drainage.  Incision sites clean dry and intact.  Minimal tenderness around incision sites. .   Skin: Cool and moist.   Psychiatric: Normal affect, non-agitated, not confused       LABS:     Latest Ref Rng & Units 06/23/2023    5:18 AM 06/22/2023    4:38 AM 06/21/2023    2:35 PM  CMP  Glucose  70 - 99 mg/dL 865  784    BUN 8 - 23 mg/dL 6  11    Creatinine 6.96 - 1.00 mg/dL 2.95  2.84  1.32   Sodium 135 - 145 mmol/L 137  136    Potassium 3.5 - 5.1 mmol/L 2.7  3.7    Chloride 98 - 111 mmol/L 108  106    CO2 22 - 32 mmol/L 25  23    Calcium 8.9 - 10.3 mg/dL 8.1  8.3        Latest Ref Rng & Units 06/23/2023    5:18 AM 06/22/2023    4:38 AM 06/21/2023    2:35 PM  CBC  WBC 4.0 - 10.5 K/uL 6.7  8.5  9.8   Hemoglobin 12.0 - 15.0 g/dL 44.0  10.2  72.5   Hematocrit 36.0 - 46.0 % 34.2  36.4  39.5   Platelets 150 - 400 K/uL 122  133  149     RADS: CLINICAL DATA:  Abdominal distention   EXAM: ABDOMEN - 1 VIEW   COMPARISON:  None Available.   FINDINGS: Surgical clips in the right upper quadrant. There is a drain in the right lateral hemiabdomen with the adjacent bowel staple line. Skin staples along the left mid hemiabdomen. Gas seen in nondilated loops of small and large bowel. No frank obstruction. No obvious free air on this portable supine radiograph. The left hemi abdominal edge is clipped off the edge of the film. Vascular calcifications are seen.   IMPRESSION:  Postoperative changes identified. Drain in place along the lateral right hemiabdomen. Nonspecific bowel gas pattern     Electronically Signed   By: Karen Kays M.D.   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  Labs and vital signs reassuring.  Will continue to monitor area for pain.  Continue clear liquid diet for now, due to increase JP drainage, will send for creat to ensure no ureteral injury as cause of very large output.  KUB above reassuring despite some distention noted on exam.  If Cr analysis, negative, will advance to full liquid diet.  labs/images/medications/previous chart entries reviewed personally and relevant changes/updates noted above.

## 2023-06-23 NOTE — Progress Notes (Signed)
Physical Therapy Treatment Patient Details Name: [name] MRN: [id number] DOB: [birth year] Today's Date: 06/23/2023   History of Present Illness [name] is a 89 y.o. 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    PT Comments  Patient received in bed, daughter at bedside asking to help re-position patient in bed. Patient is agreeable to PT session. She requires min A for bed mobility with cues for technique. Cga for sit to stand and Cga for ambulation with RW 25 feet. She requires cues for safe use of AD. Patient will continue to benefit from skilled PT to improve strength, endurance, safety and independence.        If plan is discharge home, recommend the following: A little help with walking and/or transfers;A little help with bathing/dressing/bathroom;Help with stairs or ramp for entrance;Assist for transportation;Assistance with cooking/housework   Can travel by private Automotive engineer (2 wheels)    Recommendations for Other Services       Precautions / Restrictions Precautions Precautions: Fall Restrictions Weight Bearing Restrictions: No     Mobility  Bed Mobility Overal bed mobility: Needs Assistance Bed Mobility: Supine to Sit, Sit to Supine     Supine to sit: Min assist Sit to supine: Min assist   General bed mobility comments: Educated on bed mobility technique to reduce strain to abdomen.    Transfers Overall transfer level: Needs assistance Equipment used: Rolling walker (2 wheels) Transfers: Sit to/from Stand Sit to Stand: Contact guard assist           General transfer comment: CGA to stand from EOB, mild unsteadiness. Unsafe to trial without AD this date.    Ambulation/Gait Ambulation/Gait assistance: Contact guard assist Gait Distance (Feet): 25 Feet Assistive device: Rolling walker (2 wheels) Gait Pattern/deviations: Step-through pattern, Decreased step  length - right, Decreased step length - left, Decreased stride length, Trunk flexed Gait velocity: Decreased     General Gait Details: mild unsteadiness, cues for positioning inside RW. Limited by fatigue.   Stairs             Wheelchair Mobility     Tilt Bed    Modified Rankin (Stroke Patients Only)       Balance Overall balance assessment: Needs assistance Sitting-balance support: Feet supported Sitting balance-Leahy Scale: Good     Standing balance support: Bilateral upper extremity supported, During functional activity, Reliant on assistive device for balance Standing balance-Leahy Scale: Fair Standing balance comment: mild unsteadiness, reliant on AD on eval for stability                            Cognition Arousal: Alert Behavior During Therapy: WFL for tasks assessed/performed Overall Cognitive Status: Within Functional Limits for tasks assessed                                          Exercises      General Comments        Pertinent Vitals/Pain Pain Assessment Pain Assessment: Faces Faces Pain Scale: Hurts a little bit Pain Location: Abdomen Pain Descriptors / Indicators: Discomfort, Sore, Tightness Pain Intervention(s): Monitored during session, Repositioned    Home Living  Prior Function            PT Goals (current goals can now be found in the care plan section) Acute Rehab PT Goals Patient Stated Goal: Get back my independence PT Goal Formulation: With patient Time For Goal Achievement: 07/06/23 Potential to Achieve Goals: Good Progress towards PT goals: Progressing toward goals    Frequency    Min 1X/week      PT Plan      Co-evaluation              AM-PAC PT "6 Clicks" Mobility   Outcome Measure  Help needed turning from your back to your side while in a flat bed without using bedrails?: A Little Help needed moving from lying on your back to  sitting on the side of a flat bed without using bedrails?: A Little Help needed moving to and from a bed to a chair (including a wheelchair)?: A Little Help needed standing up from a chair using your arms (e.g., wheelchair or bedside chair)?: A Little Help needed to walk in hospital room?: A Little Help needed climbing 3-5 steps with a railing? : A Lot 6 Click Score: 17    End of Session Equipment Utilized During Treatment: Gait belt Activity Tolerance: Patient limited by fatigue Patient left: in bed;with call bell/phone within reach;with family/visitor present Nurse Communication: Mobility status PT Visit Diagnosis: Unsteadiness on feet (R26.81);Other abnormalities of gait and mobility (R26.89);Muscle weakness (generalized) (M62.81);Difficulty in walking, not elsewhere classified (R26.2);Pain Pain - part of body:  (abdomen)     Time: 1610-9604 PT Time Calculation (min) (ACUTE ONLY): 8 min  Charges:    $Gait Training: 8-22 mins PT General Charges $$ ACUTE PT VISIT: 1 Visit                      , PT, [facility] 06/23/23,3:10 PM

## 2023-06-24 LAB — GLUCOSE, CAPILLARY
Glucose-Capillary: 135 mg/dL — ABNORMAL HIGH (ref 70–99)
Glucose-Capillary: 203 mg/dL — ABNORMAL HIGH (ref 70–99)
Glucose-Capillary: 188 mg/dL — ABNORMAL HIGH (ref 70–99)
Glucose-Capillary: 221 mg/dL — ABNORMAL HIGH (ref 70–99)

## 2023-06-24 MED ORDER — ACETAMINOPHEN 325 MG PO TABS
650.0000 mg | ORAL_TABLET | Freq: Four times a day (QID) | ORAL | Status: DC | PRN
  Administered 2023-06-25 – 2023-06-26 (×2): 650 mg via ORAL
  Filled 2023-06-24 (×2): qty 2

## 2023-06-24 MED ORDER — ENSURE ENLIVE PO LIQD
237.0000 mL | Freq: Two times a day (BID) | ORAL | Status: DC
  Administered 2023-06-25 – 2023-06-26 (×2): 237 mL via ORAL

## 2023-06-24 MED ORDER — POTASSIUM CHLORIDE CRYS ER 20 MEQ PO TBCR
20.0000 meq | EXTENDED_RELEASE_TABLET | ORAL | Status: AC
  Administered 2023-06-24 (×2): 20 meq via ORAL
  Filled 2023-06-24 (×2): qty 1

## 2023-06-24 MED ORDER — KETOROLAC TROMETHAMINE 15 MG/ML IJ SOLN
15.0000 mg | Freq: Three times a day (TID) | INTRAMUSCULAR | Status: DC | PRN
  Administered 2023-06-24 – 2023-06-25 (×3): 15 mg via INTRAVENOUS
  Filled 2023-06-24 (×3): qty 1

## 2023-06-24 NOTE — Plan of Care (Signed)
  Problem: Education: Goal: Ability to describe self-care measures that may prevent or decrease complications (Diabetes Survival Skills Education) will improve Outcome: Progressing Goal: Individualized Educational Video(s) Outcome: Progressing   Problem: Coping: Goal: Ability to adjust to condition or change in health will improve Outcome: Progressing   

## 2023-06-24 NOTE — TOC Progression Note (Signed)
Transition of Care [facility]) - Progression Note    Patient Details  Name: [name] MRN: [id number] Date of Birth: 1929-02-03  Transition of [facility]) CM/SW Contact  Chapman Fitch, RN Phone Number: 06/24/2023, 11:42 AM  Clinical Narrative:      RW referral made to Jon with [facility] Per MD anticipated DC Saturday Saturday.  [name] with [facility] notified.    Expected Discharge Plan: Home w Home Health Services Barriers to Discharge: Continued Medical Work up  Expected Discharge Plan and Services     Post Acute Care Choice: Durable Medical Equipment, Home Health Living arrangements for the past 2 months: Single Family Home                           HH Arranged: PT HH Agency: [facility] Date HH Agency Contacted: 06/22/23   Representative spoke with at [facility] Agency: Feliberto Gottron   Social Determinants of Health (SDOH) Interventions SDOH Screenings   Food Insecurity: No Food Insecurity (06/21/2023)  Housing: Low Risk  (06/21/2023)  Transportation Needs: No Transportation Needs (06/21/2023)  Utilities: Not At Risk (06/21/2023)  Alcohol Screen: Low Risk  (05/09/2020)  Depression (PHQ2-9): Low Risk  (02/24/2023)  Financial Resource Strain: Low Risk  (05/09/2020)  Physical Activity: Inactive (05/09/2020)  Social Connections: Moderately Isolated (05/09/2020)  Stress: No Stress Concern Present (05/09/2020)  Tobacco Use: Medium Risk (06/21/2023)    Readmission Risk Interventions     No data to display

## 2023-06-24 NOTE — Care Management Important Message (Signed)
Important Message  Patient Details  Name: [name] MRN: [id number] Date of Birth: 05-17-1929   Medicare Important Message Given:  Yes     Johnell Comings 06/24/2023, 10:52 AM

## 2023-06-24 NOTE — Progress Notes (Signed)
Physical Therapy Treatment Patient Details Name: TRUMAN WAGLE MRN: [id number] DOB: [birth year] Today's Date: 06/24/2023   History of Present Illness ALITZEL SIEVER is a 89 y.o. 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    PT Comments  Pt resting in bed upon PT arrival; agreeable to therapy.  No c/o pain during session.  Currently pt is SBA semi-supine to sitting edge of bed; CGA with transfers; and CGA to ambulate 150 feet with RW use.  Improved ambulation distance and tolerance to activity noted today.  Will continue to focus on strengthening and progressive functional mobility during hospitalization.    If plan is discharge home, recommend the following: A little help with walking and/or transfers;A little help with bathing/dressing/bathroom;Help with stairs or ramp for entrance;Assist for transportation;Assistance with cooking/housework   Can travel by private Scientist, research (medical) walker (2 wheels)    Recommendations for Other Services       Precautions / Restrictions Precautions Precautions: Fall Precaution Comments: LLQ drain Restrictions Weight Bearing Restrictions: No     Mobility  Bed Mobility Overal bed mobility: Needs Assistance Bed Mobility: Supine to Sit     Supine to sit: Supervision, HOB elevated     General bed mobility comments: mild increased effort to perform on own    Transfers Overall transfer level: Needs assistance Equipment used: Rolling walker (2 wheels) Transfers: Sit to/from Stand Sit to Stand: Contact guard assist           General transfer comment: mild increased effort to stand from bed; steady with RW use    Ambulation/Gait Ambulation/Gait assistance: Contact guard assist Gait Distance (Feet): 150 Feet Assistive device: Rolling walker (2 wheels) Gait Pattern/deviations: Step-through pattern, Decreased step length - right, Decreased step length - left Gait  velocity: decreased     General Gait Details: steady with RW use   Stairs             Wheelchair Mobility     Tilt Bed    Modified Rankin (Stroke Patients Only)       Balance Overall balance assessment: Needs assistance Sitting-balance support: No upper extremity supported, Feet supported Sitting balance-Leahy Scale: Good Sitting balance - Comments: steady reaching within BOS   Standing balance support: Bilateral upper extremity supported, During functional activity, Reliant on assistive device for balance Standing balance-Leahy Scale: Good Standing balance comment: steady ambulating with RW use                            Cognition Arousal: Alert Behavior During Therapy: WFL for tasks assessed/performed Overall Cognitive Status: Within Functional Limits for tasks assessed                                          Exercises      General Comments  Nursing cleared pt for participation in physical therapy.  Pt agreeable to PT session.      Pertinent Vitals/Pain Pain Assessment Pain Assessment: No/denies pain Pain Location: RLQ Pain Intervention(s): Limited activity within patient's tolerance, Monitored during session, Repositioned Vitals (HR and SpO2 on room air) stable and WFL throughout treatment session.    Home Living  Prior Function            PT Goals (current goals can now be found in the care plan section) Acute Rehab PT Goals Patient Stated Goal: Get back my independence PT Goal Formulation: With patient Time For Goal Achievement: 07/06/23 Potential to Achieve Goals: Good Progress towards PT goals: Progressing toward goals    Frequency    Min 1X/week      PT Plan      Co-evaluation              AM-PAC PT "6 Clicks" Mobility   Outcome Measure  Help needed turning from your back to your side while in a flat bed without using bedrails?: A Little Help needed  moving from lying on your back to sitting on the side of a flat bed without using bedrails?: A Little Help needed moving to and from a bed to a chair (including a wheelchair)?: A Little Help needed standing up from a chair using your arms (e.g., wheelchair or bedside chair)?: A Little Help needed to walk in hospital room?: A Little Help needed climbing 3-5 steps with a railing? : A Little 6 Click Score: 18    End of Session Equipment Utilized During Treatment: Gait belt Activity Tolerance: Patient tolerated treatment well Patient left: in chair;with call bell/phone within reach;with chair alarm set Nurse Communication: Mobility status;Precautions PT Visit Diagnosis: Unsteadiness on feet (R26.81);Other abnormalities of gait and mobility (R26.89);Muscle weakness (generalized) (M62.81);Difficulty in walking, not elsewhere classified (R26.2);Pain Pain - part of body:  (abdomen)     Time: 8657-8469 PT Time Calculation (min) (ACUTE ONLY): 13 min  Charges:    $Therapeutic Activity: 8-22 mins PT General Charges $$ ACUTE PT VISIT: 1 Visit                     Hendricks Limes, PT 06/24/23, 5:19 PM

## 2023-06-24 NOTE — Plan of Care (Signed)

## 2023-06-24 NOTE — Progress Notes (Signed)
Subjective:  CC: [name] is a 89 y.o. female  Hospital stay day 3, 3 Days Post-Op robotic assisted right hemicolectomy for cecal colon cancer  HPI: No acute issues overnight.  Tolerating full liquid diet.  ROS:  General: Denies weight loss, weight gain, fatigue, fevers, chills, and night sweats. Heart: Denies chest pain, palpitations, racing heart, irregular heartbeat, leg pain or swelling, and decreased activity tolerance. Respiratory: Denies breathing difficulty, shortness of breath, wheezing, cough, and sputum. GI: Denies change in appetite, heartburn, nausea, vomiting, constipation, diarrhea, and blood in stool. GU: Denies difficulty urinating, pain with urinating, urgency, frequency, blood in urine.   Objective:   Temp:  [97.8 F (36.6 C)-98.2 F (36.8 C)] 98.2 F (36.8 C) (08/08 0743) Pulse Rate:  [61-73] 61 (08/08 0743) Resp:  [16-18] 16 (08/08 0743) BP: (116-123)/(58-89) 118/58 (08/08 0743) SpO2:  [94 %-98 %] 95 % (08/08 0743)     Height: 5\' 2"  (157.5 cm) Weight: 59.9 kg BMI (Calculated): 24.14   Intake/Output this shift:   Intake/Output Summary (Last 24 hours) at 06/24/2023 1547 Last data filed at 06/24/2023 1503 Gross per 24 hour  Intake 240 ml  Output 575 ml  Net -335 ml    Constitutional :  alert, cooperative, appears stated age, and no distress  Respiratory:  clear to auscultation bilaterally  Cardiovascular:  regular rate and rhythm  Gastrointestinal: Soft, no guarding, focal tenderness to palpation right lower quadrant, improving.  JP with serous drainage.  Incision sites clean dry and intact.  Minimal tenderness around incision sites. .   Skin: Cool and moist.   Psychiatric: Normal affect, non-agitated, not confused       LABS:     Latest Ref Rng & Units 06/24/2023    4:49 AM 06/23/2023    5:18 AM 06/22/2023    4:38 AM  CMP  Glucose 70 - 99 mg/dL 098  119  147   BUN 8 - 23 mg/dL 5  6  11    Creatinine 0.44 - 1.00 mg/dL 8.29  5.62  1.30   Sodium 135 -  145 mmol/L 138  137  136   Potassium 3.5 - 5.1 mmol/L 3.1  2.7  3.7   Chloride 98 - 111 mmol/L 105  108  106   CO2 22 - 32 mmol/L 25  25  23    Calcium 8.9 - 10.3 mg/dL 8.1  8.1  8.3       Latest Ref Rng & Units 06/24/2023    4:49 AM 06/23/2023    5:18 AM 06/22/2023    4:38 AM  CBC  WBC 4.0 - 10.5 K/uL 6.7  6.7  8.5   Hemoglobin 12.0 - 15.0 g/dL 86.5  78.4  69.6   Hematocrit 36.0 - 46.0 % 35.0  34.2  36.4   Platelets 150 - 400 K/uL 151  122  133     RADS: CLINICAL DATA:  Abdominal distention   EXAM: ABDOMEN - 1 VIEW   COMPARISON:  None Available.   FINDINGS: Surgical clips in the right upper quadrant. There is a drain in the right lateral hemiabdomen with the adjacent bowel staple line. Skin staples along the left mid hemiabdomen. Gas seen in nondilated loops of small and large bowel. No frank obstruction. No obvious free air on this portable supine radiograph. The left hemi abdominal edge is clipped off the edge of the film. Vascular calcifications are seen.   IMPRESSION: Postoperative changes identified. Drain in place along the lateral right hemiabdomen. Nonspecific bowel  gas pattern     Electronically Signed   By: Karen Kays M.D.   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  Clinical exam improved today.  Multiple bowel movements recorded after full liquid diet.  Patient also subjectively states feeling slightly better as well.  Labs reassuring with no signs of ureteral injury and JP output continues to be serous.  Will advance to regular diet and continue to monitor.  Hopefully home in the next day or 2.  PT eval will continue.  Home health PT and equipment ordered.  labs/images/medications/previous chart entries reviewed personally and relevant changes/updates noted above.

## 2023-06-25 LAB — GLUCOSE, CAPILLARY
Glucose-Capillary: 152 mg/dL — ABNORMAL HIGH (ref 70–99)
Glucose-Capillary: 215 mg/dL — ABNORMAL HIGH (ref 70–99)
Glucose-Capillary: 190 mg/dL — ABNORMAL HIGH (ref 70–99)
Glucose-Capillary: 184 mg/dL — ABNORMAL HIGH (ref 70–99)

## 2023-06-25 MED ORDER — IBUPROFEN 400 MG PO TABS
400.0000 mg | ORAL_TABLET | Freq: Four times a day (QID) | ORAL | Status: DC | PRN
  Administered 2023-06-25 (×2): 400 mg via ORAL
  Filled 2023-06-25 (×4): qty 1

## 2023-06-25 MED ORDER — IBUPROFEN 400 MG PO TABS: 400.0000 mg | ORAL_TABLET | Freq: Three times a day (TID) | ORAL | 0 refills | Status: DC | PRN

## 2023-06-25 MED ORDER — ACETAMINOPHEN 325 MG PO TABS: 650.0000 mg | ORAL_TABLET | Freq: Four times a day (QID) | ORAL | 0 refills | Status: DC | PRN

## 2023-06-25 NOTE — Discharge Instructions (Signed)
Laparoscopic Colectomy, Care After This sheet gives you information about how to care for yourself after your procedure. Your health care provider may also give you more specific instructions. If you have problems or questions, contact your health care provider. What can I expect after the procedure? After your procedure, it is common to have the following: Pain in your abdomen, especially in the incision areas. You will be given medicine to control the pain. Tiredness. This is a normal part of the recovery process. Your energy level will return to normal over the next several weeks. Changes in your bowel movements, such as constipation or needing to go more often. Talk with your health care provider about how to manage this. Follow these instructions at home: Medicines  tylenol and advil as needed for discomfort.  Please alternate between  as needed for pain.     325-650mg  every 8hrs to max of 4000mg /24hrs (including the 325mg  in every norco dose) for the tylenol.   Do not drive or use heavy machinery while taking prescription pain medicine. Do not drink alcohol while taking prescription pain medicine. If you were prescribed an antibiotic medicine, use it as told by your health care provider. Do not stop using the antibiotic even if you start to feel better. Incision care    Follow instructions from your health care provider about how to take care of your incision areas. Make sure you: Keep your incisions clean and dry. Wash your hands with soap and water before and after applying medicine to the areas, and before and after changing your bandage (dressing). If soap and water are not available, use hand sanitizer. Change your dressing as told by your health care provider. Leave stitches (sutures), skin glue, or adhesive strips in place. These skin closures may need to stay in place for 2 weeks or longer. If adhesive strip edges start to loosen and curl up, you may trim the loose edges. Do not  remove adhesive strips completely unless your health care provider tells you to do that. Do not wear tight clothing over the incisions. Tight clothing may rub and irritate the incision areas, which may cause the incisions to open. Do not take baths, swim, or use a hot tub until your health care provider approves. OK TO SHOWER.   Check your incision area every day for signs of infection. Check for: More redness, swelling, or pain. More fluid or blood. Warmth. Pus or a bad smell. Activity Avoid lifting anything that is heavier than 10 lb (4.5 kg) for 2 weeks or until your health care provider says it is okay. You may resume normal activities as told by your health care provider. Ask your health care provider what activities are safe for you. Take rest breaks during the day as needed. Eating and drinking Follow instructions from your health care provider about what you can eat after surgery. To prevent or treat constipation while you are taking prescription pain medicine, your health care provider may recommend that you: Drink enough fluid to keep your urine clear or pale yellow. Take over-the-counter or prescription medicines. Eat foods that are high in fiber, such as fresh fruits and vegetables, whole grains, and beans. Limit foods that are high in fat and processed sugars, such as fried and sweet foods. General instructions Ask your health care provider when you will need an appointment to get your sutures or staples removed. Keep all follow-up visits as told by your health care provider. This is important. Contact a health care  provider if: You have more redness, swelling, or pain around your incisions. You have more fluid or blood coming from the incisions. Your incisions feel warm to the touch. You have pus or a bad smell coming from your incisions or your dressing. You have a fever. You have an incision that breaks open (edges not staying together) after sutures or staples have been  removed. Get help right away if: You develop a rash. You have chest pain or difficulty breathing. You have pain or swelling in your legs. You feel light-headed or you faint. Your abdomen swells (becomes distended). You have nausea or vomiting. You have blood in your stool (feces). This information is not intended to replace advice given to you by your health care provider. Make sure you discuss any questions you have with your health care provider. Document Released: 05/22/2005 Document Revised: 07/22/2018 Document Reviewed: 08/03/2016 Elsevier Interactive Patient Education  2019 [employer].

## 2023-06-25 NOTE — Progress Notes (Signed)
Physical Therapy Treatment Patient Details Name: [name] MRN: [id number] DOB: 1929/10/30 Today's Date: 06/25/2023   History of Present Illness [name] is a 89 y.o. 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    PT Comments  Pt resting in bed upon PT arrival; pt's daughter in law present; pt reports not feeling well this morning (d/t intermittent pain in R abdomen--nurse aware) but agreeable to trying to walk.  2/10 R abdominal pain throughout session.  During session pt min assist semi-supine to sitting edge of bed; CGA with transfers; and CGA to ambulate 150 feet with RW use.  Nurse notified of drainage LLQ dressing (drain site) and skin concerns lateral to dressing (nurse reported she would do skin assessment).    If plan is discharge home, recommend the following: A little help with walking and/or transfers;A little help with bathing/dressing/bathroom;Help with stairs or ramp for entrance;Assist for transportation;Assistance with cooking/housework   Can travel by private vehicle      Yes  Equipment Recommendations  Rolling walker (2 wheels)    Recommendations for Other Services       Precautions / Restrictions Precautions Precautions: Fall Precaution Comments: LLQ drain Restrictions Weight Bearing Restrictions: No     Mobility  Bed Mobility Overal bed mobility: Needs Assistance Bed Mobility: Supine to Sit     Supine to sit: Min assist, HOB elevated     General bed mobility comments: assist for trunk; vc's for technique    Transfers Overall transfer level: Needs assistance Equipment used: Rolling walker (2 wheels) Transfers: Sit to/from Stand Sit to Stand: Contact guard assist           General transfer comment: mild increased effort to stand from bed, BSC, and recliner; stand step turn bed to [facility] to recliner; steady with RW use; vc's for UE placement    Ambulation/Gait Ambulation/Gait assistance: Contact  guard assist Gait Distance (Feet): 150 Feet Assistive device: Rolling walker (2 wheels) Gait Pattern/deviations: Step-through pattern, Decreased step length - right, Decreased step length - left Gait velocity: decreased     General Gait Details: steady with RW use   Stairs             Wheelchair Mobility     Tilt Bed    Modified Rankin (Stroke Patients Only)       Balance Overall balance assessment: Needs assistance Sitting-balance support: No upper extremity supported, Feet supported Sitting balance-Leahy Scale: Good Sitting balance - Comments: steady reaching within BOS   Standing balance support: Bilateral upper extremity supported, During functional activity, Reliant on assistive device for balance Standing balance-Leahy Scale: Good Standing balance comment: steady ambulating with RW use                            Cognition Arousal: Alert Behavior During Therapy: WFL for tasks assessed/performed Overall Cognitive Status: Within Functional Limits for tasks assessed                                          Exercises      General Comments General comments (skin integrity, edema, etc.): Prior to OOB mobility, drainage noted LLQ dressing (for bulb); redness/skin irritation/skin breakdown noted lateral anterior L hip skin fold; no change noted with drainage or skin appearance with activity (checked end of session); nurse notified of  drainage and skin concerns      Pertinent Vitals/Pain Pain Assessment Pain Assessment: 0-10 Pain Score: 2  Pain Location: RLQ Pain Descriptors / Indicators: Discomfort, Sore Pain Intervention(s): Limited activity within patient's tolerance, Monitored during session, Premedicated before session, Repositioned Vitals (HR and SpO2 on room air) stable and WFL throughout treatment session.    Home Living                          Prior Function            PT Goals (current goals can now be  found in the care plan section) Acute Rehab PT Goals Patient Stated Goal: Get back my independence PT Goal Formulation: With patient Time For Goal Achievement: 07/06/23 Potential to Achieve Goals: Good Progress towards PT goals: Progressing toward goals    Frequency    Min 1X/week      PT Plan      Co-evaluation              AM-PAC PT "6 Clicks" Mobility   Outcome Measure  Help needed turning from your back to your side while in a flat bed without using bedrails?: A Little Help needed moving from lying on your back to sitting on the side of a flat bed without using bedrails?: A Little Help needed moving to and from a bed to a chair (including a wheelchair)?: A Little Help needed standing up from a chair using your arms (e.g., wheelchair or bedside chair)?: A Little Help needed to walk in hospital room?: A Little Help needed climbing 3-5 steps with a railing? : A Little 6 Click Score: 18    End of Session Equipment Utilized During Treatment: Gait belt Activity Tolerance: Patient limited by fatigue Patient left: in chair;with call bell/phone within reach;with chair alarm set;with family/visitor present Nurse Communication: Mobility status;Precautions;Other (comment) (Pt's drainage and skin concerns) PT Visit Diagnosis: Unsteadiness on feet (R26.81);Other abnormalities of gait and mobility (R26.89);Muscle weakness (generalized) (M62.81);Difficulty in walking, not elsewhere classified (R26.2);Pain Pain - part of body:  (R abdomen)     Time: 1610-9604 PT Time Calculation (min) (ACUTE ONLY): 32 min  Charges:    $Gait Training: 8-22 mins $Therapeutic Activity: 8-22 mins PT General Charges $$ ACUTE PT VISIT: 1 Visit                     Hendricks Limes, PT 06/25/23, 11:30 AM

## 2023-06-25 NOTE — Progress Notes (Signed)
Subjective:  CC: [name] is a 89 y.o. 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  Tolerating regular diet.  ROS:  General: Denies weight loss, weight gain, fatigue, fevers, chills, and night sweats. Heart: Denies chest pain, palpitations, racing heart, irregular heartbeat, leg pain or swelling, and decreased activity tolerance. Respiratory: Denies breathing difficulty, shortness of breath, wheezing, cough, and sputum. GI: Denies change in appetite, heartburn, nausea, vomiting, constipation, diarrhea, and blood in stool. GU: Denies difficulty urinating, pain with urinating, urgency, frequency, blood in urine.   Objective:   Temp:  [98 F (36.7 C)-98.7 F (37.1 C)] 98.2 F (36.8 C) (08/09 0745) Pulse Rate:  [64-73] 68 (08/09 0745) Resp:  [16-18] 16 (08/09 0745) BP: (110-136)/(66-68) 130/68 (08/09 0745) SpO2:  [97 %-99 %] 97 % (08/09 0745)     Height: 5\' 2"  (157.5 cm) Weight: 59.9 kg BMI (Calculated): 24.14   Intake/Output this shift:   Intake/Output Summary (Last 24 hours) at 06/25/2023 1341 Last data filed at 06/25/2023 0848 Gross per 24 hour  Intake --  Output 110 ml  Net -110 ml    Constitutional :  alert, cooperative, appears stated age, and no distress  Respiratory:  clear to auscultation bilaterally  Cardiovascular:  regular rate and rhythm  Gastrointestinal: Soft, no guarding, focal tenderness to palpation right lower quadrant, improving.  JP with serous drainage.  Incision sites clean dry and intact.  Minimal tenderness around incision sites. .   Skin: Cool and moist.   Psychiatric: Normal affect, non-agitated, not confused       LABS:     Latest Ref Rng & Units 06/25/2023    4:34 AM 06/24/2023    4:49 AM 06/23/2023    5:18 AM  CMP  Glucose 70 - 99 mg/dL 409  811  914   BUN 8 - 23 mg/dL 12  <5  6   Creatinine 0.44 - 1.00 mg/dL  7.82  9.56  2.13   Sodium 135 - 145 mmol/L 140  138  137   Potassium 3.5 - 5.1 mmol/L 3.7  3.1  2.7   Chloride 98 - 111 mmol/L 106  105  108   CO2 22 - 32 mmol/L 26  25  25    Calcium 8.9 - 10.3 mg/dL 8.4  8.1  8.1       Latest Ref Rng & Units 06/25/2023    4:34 AM 06/24/2023    4:49 AM 06/23/2023    5:18 AM  CBC  WBC 4.0 - 10.5 K/uL 6.5  6.7  6.7   Hemoglobin 12.0 - 15.0 g/dL 08.6  57.8  46.9   Hematocrit 36.0 - 46.0 % 35.0  35.0  34.2   Platelets 150 - 400 K/uL 140  151  122     RADS: N/a   Assessment:   S/p 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  TTP RLQ again.  She has not required anything stronger than toradol.  She is tolerating regular diet with multiple bowel movements.  Labs continue to be reassuring.  JP output has started to decrease and remains serosanguineous.  Will adjust pain medications to oral meds in anticipation of discharge tomorrow due to the intermittent report of right lower quadrant pain.  labs/images/medications/previous chart entries reviewed personally and relevant changes/updates noted above.

## 2023-06-25 NOTE — Plan of Care (Signed)

## 2023-06-26 LAB — BASIC METABOLIC PANEL WITH GFR
Anion gap: 10 (ref 5–15)
BUN: 17 mg/dL (ref 8–23)
CO2: 25 mmol/L (ref 22–32)
Calcium: 9.2 mg/dL (ref 8.9–10.3)
Chloride: 104 mmol/L (ref 98–111)
Creatinine, Ser: 0.62 mg/dL (ref 0.44–1.00)
GFR, Estimated: >60 mL/min
Glucose, Bld: 165 mg/dL — ABNORMAL HIGH (ref 70–99)
Potassium: 3.6 mmol/L (ref 3.5–5.1)
Sodium: 139 mmol/L (ref 135–145)

## 2023-06-26 LAB — CBC
HCT: 36.6 % (ref 36.0–46.0)
Hemoglobin: 12.2 g/dL (ref 12.0–15.0)
MCH: 29.7 pg (ref 26.0–34.0)
MCHC: 33.3 g/dL (ref 30.0–36.0)
MCV: 89.1 fL (ref 80.0–100.0)
Platelets: 153 K/uL (ref 150–400)
RBC: 4.11 MIL/uL (ref 3.87–5.11)
RDW: 13.6 % (ref 11.5–15.5)
WBC: 6.7 K/uL (ref 4.0–10.5)
nRBC: 0 % (ref 0.0–0.2)

## 2023-06-26 LAB — GLUCOSE, CAPILLARY
Glucose-Capillary: 192 mg/dL — ABNORMAL HIGH (ref 70–99)
Glucose-Capillary: 282 mg/dL — ABNORMAL HIGH (ref 70–99)

## 2023-06-26 MED ORDER — KETOROLAC TROMETHAMINE 10 MG PO TABS: 10.0000 mg | ORAL_TABLET | Freq: Four times a day (QID) | ORAL | 0 refills | Status: DC | PRN

## 2023-06-26 NOTE — TOC Transition Note (Signed)
Transition of Care [facility]) - CM/SW Discharge Note   Patient Details  Name: [name] MRN: [id number] Date of Birth: 16-Jan-1929  [facility]) CM/SW Contact:  Kemper Durie, RN Phone Number: 06/26/2023, 10:23 AM   Clinical Narrative:     Spoke with patient, aware of discharge orders today.  State daughter in law will provide transportation home.  Confirms she received rolling walker at the bedside.  [name] with [facility] notified that patient will go home today, HHPT orders are in.   Final next level of care: Home w Home Health Services Barriers to Discharge: Barriers Resolved   Patient Goals and CMS Choice CMS Medicare.gov Compare Post Acute Care list provided to:: Patient Choice offered to / list presented to : Patient, Adult Children  Discharge Placement                         Discharge Plan and Services Additional resources added to the After Visit Summary for       Post Acute Care Choice: Durable Medical Equipment, Home Health          DME Arranged: Walker rolling DME Agency: [employer]       HH Arranged: PT HH Agency: [facility] Date HH Agency Contacted: 06/26/23 Time HH Agency Contacted: 1022 Representative spoke with at [facility] Agency: Feliberto Gottron  Social Determinants of Health (SDOH) Interventions SDOH Screenings   Food Insecurity: No Food Insecurity (06/21/2023)  Housing: Low Risk  (06/21/2023)  Transportation Needs: No Transportation Needs (06/21/2023)  Utilities: Not At Risk (06/21/2023)  Alcohol Screen: Low Risk  (05/09/2020)  Depression (PHQ2-9): Low Risk  (02/24/2023)  Financial Resource Strain: Low Risk  (05/09/2020)  Physical Activity: Inactive (05/09/2020)  Social Connections: Moderately Isolated (05/09/2020)  Stress: No Stress Concern Present (05/09/2020)  Tobacco Use: Medium Risk (06/21/2023)     Readmission Risk Interventions     No data to display

## 2023-06-26 NOTE — Progress Notes (Signed)
Mobility Specialist - Progress Note    06/26/23 0958  Mobility  Activity Ambulated with assistance in hallway  Level of Assistance Standby assist, set-up cues, supervision of patient - no hands on  Assistive Device Front wheel walker  Distance Ambulated (ft) 160 ft  Range of Motion/Exercises Active  Activity Response Tolerated well  Mobility Referral Yes  $Mobility charge 1 Mobility  Mobility Specialist Start Time (ACUTE ONLY) [id number]  Mobility Specialist Stop Time (ACUTE ONLY) 0954  Mobility Specialist Time Calculation (min) (ACUTE ONLY) 17 min   Pt resting in recliner on RA upon entry. Pt STS and ambulates to hallway SBA with RW. Pt gait is moderately stable. Pt returned to recliner and left with needs in reach.   Johnathan Hausen Mobility Specialist 06/26/23, 10:00 AM

## 2023-06-26 NOTE — Discharge Summary (Signed)
Patient ID: [name] MRN: [id number] DOB/AGE: 1929/10/08 89 y.o.  Admit date: 06/21/2023 Discharge date: 06/26/2023   Discharge Diagnoses:  Principal Problem:   Colon cancer [facility])   Procedures: Robotic assisted upper scopic right hemicolectomy  Hospital Course: Patient admitted with right colon cancer.  She underwent robotic assisted laparoscopic right hemicolectomy.  She has been recovering slowly but adequately.  Pain has been controlled.  This morning patient without pain.  She is tolerating diet.  She is ambulating.  She is having bowel movement.  The wounds are dry and clean.  Drain serosanguineous.  Physical Exam Vitals reviewed.  Constitutional:      Appearance: Normal appearance.  HENT:     Head: Normocephalic.  Cardiovascular:     Rate and Rhythm: Normal rate and regular rhythm.  Pulmonary:     Effort: Pulmonary effort is normal.  Abdominal:     General: Abdomen is flat. Bowel sounds are normal. There is no distension.     Palpations: Abdomen is soft.  Musculoskeletal:     Cervical back: Normal range of motion.  Skin:    General: Skin is warm.     Capillary Refill: Capillary refill takes less than 2 seconds.  Neurological:     Mental Status: She is alert and oriented to person, place, and time.      Consults: None  Disposition: Discharge disposition: 01-Home or Self Care       Discharge Instructions     Diet - low sodium heart healthy   Complete by: As directed    Increase activity slowly   Complete by: As directed       Allergies as of 06/26/2023       Reactions   Cephalosporins    "upset stomach"   Codeine    Macrolides And Ketolides    "upset stomach" Other reaction(s): Unknown "upset stomach"   Nitrofurantoin    Other reaction(s): Other (See Comments)   Penicillins    Tramadol Nausea Only   Doxycycline Rash   Sulfa Antibiotics Rash        Medication List     STOP taking these medications    aspirin EC 81 MG tablet    neomycin 500 MG tablet Commonly known as: MYCIFRADIN       TAKE these medications    acetaminophen 325 MG tablet Commonly known as: TYLENOL Take 2 tablets (650 mg total) by mouth every 6 (six) hours as needed for mild pain, moderate pain, fever or headache.   amLODipine 10 MG tablet Commonly known as: NORVASC Take 1 tablet (10 mg total) by mouth daily.   atorvastatin 40 MG tablet Commonly known as: LIPITOR Take 1 tablet (40 mg total) by mouth daily.   cholecalciferol 25 MCG (1000 UNIT) tablet Commonly known as: VITAMIN D3 Take 1,000 Units by mouth daily.   erythromycin ophthalmic ointment 1 Application at bedtime.   glipiZIDE 10 MG 24 hr tablet Commonly known as: GLUCOTROL XL Take 1 tablet (10 mg total) by mouth every other day.   hydrochlorothiazide 12.5 MG tablet Commonly known as: HYDRODIURIL Take 1 tablet (12.5 mg total) by mouth every other day.   ibuprofen 400 MG tablet Commonly known as: ADVIL Take 1 tablet (400 mg total) by mouth every 8 (eight) hours as needed for moderate pain.   NEOMYCIN-POLYMYXIN-HC OP Apply 1 drop to eye as needed.   oxybutynin 10 MG 24 hr tablet Commonly known as: DITROPAN-XL Take 10 mg by mouth at bedtime.   temazepam 15  MG capsule Commonly known as: RESTORIL TAKE 1 CAPSULE BY MOUTH AT BEDTIME AS NEEDED FOR SLEEP               Durable Medical Equipment  (From admission, onward)           Start     Ordered   06/23/23 1856  For home use only DME Walker rolling  Once       Question Answer Comment  Walker: With 5 Inch Wheels   Patient needs a walker to treat with the following condition Physical deconditioning      06/23/23 1856            Follow-up Information     Sakai, Isami, DO Follow up in 1 week(s).   Specialties: General Surgery, Surgery Why: post op colectomy Contact information: [address] [place] [address] [phone]

## 2023-06-26 NOTE — Progress Notes (Signed)
Patient discharged home with family, discharge teaching with teach back method used to confirm understanding. Patient was given dressing supplies and measuring cups for JP drain and family was taught bedside hands on to verify understanding. Patients IV's removed and tolerated well. Patient was driven home by family and discharged via wheelchair.

## 2023-06-28 ENCOUNTER — Telehealth: Payer: Self-pay | Admitting: Family Medicine

## 2023-06-28 DIAGNOSIS — Z483 Aftercare following surgery for neoplasm: Secondary | ICD-10-CM | POA: Diagnosis not present

## 2023-06-28 DIAGNOSIS — K5790 Diverticulosis of intestine, part unspecified, without perforation or abscess without bleeding: Secondary | ICD-10-CM | POA: Diagnosis not present

## 2023-06-28 DIAGNOSIS — Z8673 Personal history of transient ischemic attack (TIA), and cerebral infarction without residual deficits: Secondary | ICD-10-CM | POA: Diagnosis not present

## 2023-06-28 DIAGNOSIS — C3490 Malignant neoplasm of unspecified part of unspecified bronchus or lung: Secondary | ICD-10-CM | POA: Diagnosis not present

## 2023-06-28 DIAGNOSIS — E785 Hyperlipidemia, unspecified: Secondary | ICD-10-CM | POA: Diagnosis not present

## 2023-06-28 DIAGNOSIS — Z9049 Acquired absence of other specified parts of digestive tract: Secondary | ICD-10-CM | POA: Diagnosis not present

## 2023-06-28 DIAGNOSIS — C182 Malignant neoplasm of ascending colon: Secondary | ICD-10-CM | POA: Diagnosis not present

## 2023-06-28 DIAGNOSIS — Z7984 Long term (current) use of oral hypoglycemic drugs: Secondary | ICD-10-CM | POA: Diagnosis not present

## 2023-06-28 DIAGNOSIS — Z87891 Personal history of nicotine dependence: Secondary | ICD-10-CM | POA: Diagnosis not present

## 2023-06-28 DIAGNOSIS — I350 Nonrheumatic aortic (valve) stenosis: Secondary | ICD-10-CM | POA: Diagnosis not present

## 2023-06-28 DIAGNOSIS — I10 Essential (primary) hypertension: Secondary | ICD-10-CM | POA: Diagnosis not present

## 2023-06-28 NOTE — Telephone Encounter (Signed)
Home Health Verbal Orders - Caller/Agency: Creta Levin with [facility]  Callback Number: [phone] secure line  Requesting OT/PT/Skilled Nursing/Social Work/Speech Therapy: PT  Frequency: 1 w 9

## 2023-06-29 ENCOUNTER — Telehealth: Payer: Self-pay | Admitting: *Deleted

## 2023-06-29 NOTE — Transitions of Care (Post Inpatient/ED Visit) (Signed)
06/29/2023  Name: [name] MRN: [id number] DOB: 13-Jul-1929  Today's TOC FU Call Status: Today's TOC FU Call Status:: Successful TOC FU Call Completed TOC FU Call Complete Date: 06/29/23  Transition Care Management Follow-up Telephone Call Date of Discharge: 06/26/23 Discharge Facility: [facility]) Type of Discharge: Inpatient Admission Primary Inpatient Discharge Diagnosis:: Cancer of colon How have you been since you were released from the hospital?: Better Any questions or concerns?: Yes Patient Questions/Concerns:: Patient is waking up in the middle of the night in pain. RN discussed that the toral can be given q 6 hrs. She said they have been only giving her one a day and she takes the tylenol and advil during the day. RN explained that they can call the surgeon office for a refill Patient Questions/Concerns Addressed: Other:  Items Reviewed: Did you receive and understand the discharge instructions provided?: Yes Medications obtained,verified, and reconciled?: Yes (Medications Reviewed) Any new allergies since your discharge?: No Dietary orders reviewed?: No Do you have support at home?: Yes People in Home: alone Name of Support/Comfort Primary Source: atherine and [name]  Medications Reviewed Today: Medications Reviewed Today     Reviewed by Luella Cook, RN (Case Manager) on 06/29/23 at 1349  Med List Status: <None>   Medication Order Taking? Sig Documenting Provider Last Dose Status Informant  acetaminophen (TYLENOL) 325 MG tablet [id number] Yes Take 2 tablets (650 mg total) by mouth every 6 (six) hours as needed for mild pain, moderate pain, fever or headache. Sung Amabile, DO Taking Active   amLODipine (NORVASC) 10 MG tablet [id number] Yes Take 1 tablet (10 mg total) by mouth daily. Jacky Kindle, FNP Taking Active Pharmacy Records  atorvastatin (LIPITOR) 40 MG tablet [id number] Yes Take 1 tablet (40 mg total) by mouth daily. Jacky Kindle, FNP Taking Active Pharmacy Records  cholecalciferol (VITAMIN D) 1000 UNITS tablet [id number] Yes Take 1,000 Units by mouth daily.  [provider] Taking Active Pharmacy Records           Med Note Nelia Shi   Tue Jun 22, 2023 10:37 AM)    erythromycin ophthalmic ointment [id number] Yes 1 Application at bedtime. [provider] Taking Active   glipiZIDE (GLUCOTROL XL) 10 MG 24 hr tablet [id number] Yes Take 1 tablet (10 mg total) by mouth every other day. Jacky Kindle, FNP Taking Active Pharmacy Records  hydrochlorothiazide (HYDRODIURIL) 12.5 MG tablet [id number] Yes Take 1 tablet (12.5 mg total) by mouth every other day. Jacky Kindle, FNP Taking Active Pharmacy Records  ketorolac (TORADOL) 10 MG tablet [id number] Yes Take 1 tablet (10 mg total) by mouth every 6 (six) hours as needed. Carolan Shiver, MD Taking Active   [facility] OP [id number] Yes Apply 1 drop to eye as needed. [provider] Taking Active   oxybutynin (DITROPAN-XL) 10 MG 24 hr tablet [id number] Yes Take 10 mg by mouth at bedtime. [provider] Taking Active   temazepam (RESTORIL) 15 MG capsule [id number] Yes TAKE 1 CAPSULE BY MOUTH AT BEDTIME AS NEEDED FOR SLEEP Jacky Kindle, FNP Taking Active             Home Care and Equipment/Supplies: Were Home Health Services Ordered?: Yes Name of Home Health Agency:: [facility] Has Agency set up a time to come to your home?: Yes First Home Health Visit Date: 06/28/23 Any new equipment or medical supplies ordered?: Yes Name of Medical supply agency?: [facility] Were you able to get the  equipment/medical supplies?: Yes Do you have any questions related to the use of the equipment/supplies?: No  Functional Questionnaire: Do you need assistance with meal preparation?: Yes Do you need assistance with eating?: No Do you have difficulty maintaining continence: No Do you need assistance with getting out of bed/getting out  of a chair/moving?: No Do you have difficulty managing or taking your medications?: Yes  Follow up appointments reviewed: PCP Follow-up appointment confirmed?: Yes Date of PCP follow-up appointment?: 07/13/23 Follow-up Provider: Merita Norton Specialist [facility] Follow-up appointment confirmed?: Yes Date of Specialist follow-up appointment?: 07/05/23 Follow-Up Specialty Provider:: [phone] Carmina Miller radiation / Dr Katharine Look Do you need transportation to your follow-up appointment?: No Do you understand care options if your condition(s) worsen?: Yes-patient verbalized understanding  SDOH Interventions Today    Flowsheet Row Most Recent Value  SDOH Interventions   Food Insecurity Interventions Intervention Not Indicated  Housing Interventions Intervention Not Indicated  Transportation Interventions Intervention Not Indicated, Patient Resources (Friends/Family)      Interventions Today    Flowsheet Row Most Recent Value  General Interventions   General Interventions Discussed/Reviewed General Interventions Discussed, General Interventions Reviewed, Doctor Visits  Doctor Visits Discussed/Reviewed Doctor Visits Discussed, Doctor Visits Reviewed  Exercise Interventions   Exercise Discussed/Reviewed Exercise Discussed  [[facility] is providing pt. Patient is using walker to walk around the house]  Pharmacy Interventions   Pharmacy Dicussed/Reviewed Pharmacy Topics Discussed, Pharmacy Topics Reviewed  [RN discussed with daughter to contact Dr for refill on toradol]      TOC Interventions Today    Flowsheet Row Most Recent Value  TOC Interventions   TOC Interventions Discussed/Reviewed TOC Interventions Discussed, TOC Interventions Reviewed, Post op wound/incision care        Gean Maidens BSN RN [facility] [phone]

## 2023-06-29 NOTE — Telephone Encounter (Signed)
Orders given to [name] with [facility] -okay by provider

## 2023-07-05 ENCOUNTER — Ambulatory Visit
Admission: RE | Admit: 2023-07-05 | Discharge: 2023-07-05 | Disposition: A | Discharge status: Home / Self Care | Payer: Medicare Other | Source: Ambulatory Visit | Attending: Radiation Oncology | Admitting: Radiation Oncology

## 2023-07-05 ENCOUNTER — Encounter: Payer: Self-pay | Admitting: Radiation Oncology

## 2023-07-05 VITALS — BP 131/64 | HR 72 | Temp 97.9°F | Resp 16 | Wt 127.6 lb

## 2023-07-05 DIAGNOSIS — R911 Solitary pulmonary nodule: Secondary | ICD-10-CM | POA: Diagnosis not present

## 2023-07-05 NOTE — Progress Notes (Signed)
Radiation Oncology Follow up Note  Name: LAYNI PELTZ   Date:   07/05/2023 MRN:  [id number] DOB: 1929/01/12    This 89 y.o. female presents to the clinic today for 1 month follow-up status post SBRT to her left lower lobe for stage I non-small cell lung cancer.  REFERRING PROVIDER: Jacky Kindle, FNP  HPI: Patient is a 89 year old female now out 1 month having completed SBRT to her left lower lobe for stage I non-small cell lung cancer.  She is also having recent resection.  From her right colon showing invasive colorectal adenocarcinoma.  Tumor was grade 2 with margins clear 13 lymph nodes were examined all negative for metastatic disease.  Tumor was a T2 N0 M0.  From a lung standpoint she is doing well specifically [place] cough any dysphagia or any change in her pulmonary status.  COMPLICATIONS OF TREATMENT: none  FOLLOW UP COMPLIANCE: keeps appointments   PHYSICAL EXAM:  BP 131/64 (BP Location: Right Arm, Patient Position: Sitting, Cuff Size: Normal)   Pulse 72   Temp 97.9 F (36.6 C) (Tympanic)   Resp 16   Wt 127 lb 9.6 oz (57.9 kg)   SpO2 97%   BMI 23.34 kg/m  Patient is status post recent abdominal incision healing well.  Well-developed well-nourished patient in NAD. HEENT reveals PERLA, EOMI, discs not visualized.  Oral cavity is clear. No oral mucosal lesions are identified. Neck is clear without evidence of cervical or supraclavicular adenopathy. Lungs are clear to A&P. Cardiac examination is essentially unremarkable with regular rate and rhythm without murmur rub or thrill. Abdomen is benign with no organomegaly or masses noted. Motor sensory and DTR levels are equal and symmetric in the upper and lower extremities. Cranial nerves II through XII are grossly intact. Proprioception is intact. No peripheral adenopathy or edema is identified. No motor or sensory levels are noted. Crude visual fields are within normal range.  RADIOLOGY RESULTS: CT scan ordered for 3 months  PLAN:  Present time patient is doing well.  I have asked to see her back in 3 to 4 months for routine follow-up.  I have a CT scan performed prior to that visit.  Patient knows to call with any concerns at this time.  I see no reason for any additional treatment for her colon carcinoma.  I would like to take this opportunity to thank you for allowing me to participate in the care of your patient.Carmina Miller, MD

## 2023-07-06 ENCOUNTER — Other Ambulatory Visit: Payer: Self-pay | Admitting: *Deleted

## 2023-07-06 DIAGNOSIS — R911 Solitary pulmonary nodule: Secondary | ICD-10-CM

## 2023-07-07 DIAGNOSIS — Z483 Aftercare following surgery for neoplasm: Secondary | ICD-10-CM | POA: Diagnosis not present

## 2023-07-07 DIAGNOSIS — Z9049 Acquired absence of other specified parts of digestive tract: Secondary | ICD-10-CM | POA: Diagnosis not present

## 2023-07-07 DIAGNOSIS — I350 Nonrheumatic aortic (valve) stenosis: Secondary | ICD-10-CM | POA: Diagnosis not present

## 2023-07-07 DIAGNOSIS — I10 Essential (primary) hypertension: Secondary | ICD-10-CM | POA: Diagnosis not present

## 2023-07-07 DIAGNOSIS — C182 Malignant neoplasm of ascending colon: Secondary | ICD-10-CM | POA: Diagnosis not present

## 2023-07-07 DIAGNOSIS — K5790 Diverticulosis of intestine, part unspecified, without perforation or abscess without bleeding: Secondary | ICD-10-CM | POA: Diagnosis not present

## 2023-07-07 DIAGNOSIS — Z7984 Long term (current) use of oral hypoglycemic drugs: Secondary | ICD-10-CM | POA: Diagnosis not present

## 2023-07-07 DIAGNOSIS — Z87891 Personal history of nicotine dependence: Secondary | ICD-10-CM | POA: Diagnosis not present

## 2023-07-07 DIAGNOSIS — Z8673 Personal history of transient ischemic attack (TIA), and cerebral infarction without residual deficits: Secondary | ICD-10-CM | POA: Diagnosis not present

## 2023-07-07 DIAGNOSIS — E785 Hyperlipidemia, unspecified: Secondary | ICD-10-CM | POA: Diagnosis not present

## 2023-07-07 DIAGNOSIS — C3490 Malignant neoplasm of unspecified part of unspecified bronchus or lung: Secondary | ICD-10-CM | POA: Diagnosis not present

## 2023-07-13 ENCOUNTER — Ambulatory Visit (INDEPENDENT_AMBULATORY_CARE_PROVIDER_SITE_OTHER): Payer: Medicare Other | Admitting: Family Medicine

## 2023-07-13 ENCOUNTER — Encounter: Payer: Self-pay | Admitting: Family Medicine

## 2023-07-13 VITALS — BP 124/65 | HR 82 | Ht 62.0 in | Wt 129.1 lb

## 2023-07-13 DIAGNOSIS — I5032 Chronic diastolic (congestive) heart failure: Secondary | ICD-10-CM

## 2023-07-13 DIAGNOSIS — M810 Age-related osteoporosis without current pathological fracture: Secondary | ICD-10-CM | POA: Diagnosis not present

## 2023-07-13 DIAGNOSIS — E785 Hyperlipidemia, unspecified: Secondary | ICD-10-CM | POA: Insufficient documentation

## 2023-07-13 DIAGNOSIS — Z0001 Encounter for general adult medical examination with abnormal findings: Secondary | ICD-10-CM

## 2023-07-13 DIAGNOSIS — E1159 Type 2 diabetes mellitus with other circulatory complications: Secondary | ICD-10-CM

## 2023-07-13 DIAGNOSIS — Z7984 Long term (current) use of oral hypoglycemic drugs: Secondary | ICD-10-CM

## 2023-07-13 DIAGNOSIS — E1165 Type 2 diabetes mellitus with hyperglycemia: Secondary | ICD-10-CM

## 2023-07-13 DIAGNOSIS — Z0000 Encounter for general adult medical examination without abnormal findings: Secondary | ICD-10-CM | POA: Insufficient documentation

## 2023-07-13 DIAGNOSIS — Z23 Encounter for immunization: Secondary | ICD-10-CM

## 2023-07-13 DIAGNOSIS — E1169 Type 2 diabetes mellitus with other specified complication: Secondary | ICD-10-CM

## 2023-07-13 DIAGNOSIS — F5101 Primary insomnia: Secondary | ICD-10-CM

## 2023-07-13 DIAGNOSIS — I152 Hypertension secondary to endocrine disorders: Secondary | ICD-10-CM

## 2023-07-13 MED ORDER — AMLODIPINE BESYLATE 10 MG PO TABS
10.0000 mg | ORAL_TABLET | Freq: Every day | ORAL | 3 refills | Status: DC
Start: 2023-07-13 — End: 2023-10-07

## 2023-07-13 MED ORDER — ATORVASTATIN CALCIUM 40 MG PO TABS
40.0000 mg | ORAL_TABLET | Freq: Every day | ORAL | 3 refills | Status: DC
Start: 2023-07-13 — End: 2024-06-26

## 2023-07-13 MED ORDER — TEMAZEPAM 15 MG PO CAPS
ORAL_CAPSULE | ORAL | 3 refills | Status: DC
Start: 2023-07-13 — End: 2024-02-17

## 2023-07-13 NOTE — Assessment & Plan Note (Signed)
Chronic, stable Repeat CBC, CMP, TSH Continue on Norvasc 10 mg, hydrochlorothiazide 12.5 mg

## 2023-07-13 NOTE — Progress Notes (Signed)
Annual Wellness Visit  Patient: [name], Female    DOB: [birth year], 89 y.o.   MRN: [id number] Visit Date: 07/13/2023  Today's Provider: Jacky Kindle, FNP  Re Introduced to nurse practitioner role and practice setting.  All questions answered.  Discussed provider/patient relationship and expectations.  Chief Complaint  Patient presents with   Annual Exam    Would like to discuss glipizide.  Another provider would like to increase the dose    Subjective    [name] is a 88 y.o. female who presents today for her Annual Wellness Visit. She reports consuming a general diet. The patient does not participate in regular exercise at present. She generally feels well. She reports sleeping well. She does have additional problems to discuss today.    Medications: Outpatient Medications Prior to Visit  Medication Sig   acetaminophen (TYLENOL) 325 MG tablet Take 2 tablets (650 mg total) by mouth every 6 (six) hours as needed for mild pain, moderate pain, fever or headache.   amLODipine (NORVASC) 10 MG tablet Take 1 tablet (10 mg total) by mouth daily.   atorvastatin (LIPITOR) 40 MG tablet Take 1 tablet (40 mg total) by mouth daily.   cholecalciferol (VITAMIN D) 1000 UNITS tablet Take 1,000 Units by mouth daily.    erythromycin ophthalmic ointment 1 Application at bedtime.   glipiZIDE (GLUCOTROL XL) 10 MG 24 hr tablet Take 1 tablet (10 mg total) by mouth every other day.   hydrochlorothiazide (HYDRODIURIL) 12.5 MG tablet Take 1 tablet (12.5 mg total) by mouth every other day.   ibuprofen (ADVIL) 100 MG tablet Take 100 mg by mouth every 6 (six) hours as needed for fever.   ketorolac (TORADOL) 10 MG tablet Take 1 tablet (10 mg total) by mouth every 6 (six) hours as needed.   NEOMYCIN-POLYMYXIN-HC OP Apply 1 drop to eye as needed.   oxybutynin (DITROPAN-XL) 10 MG 24 hr tablet Take 10 mg by mouth at bedtime.   temazepam (RESTORIL) 15 MG capsule TAKE 1 CAPSULE BY MOUTH AT BEDTIME AS  NEEDED FOR SLEEP   No facility-administered medications prior to visit.    Allergies  Allergen Reactions   Cephalosporins     "upset stomach"   Codeine    Macrolides And Ketolides     "upset stomach" Other reaction(s): Unknown "upset stomach"   Nitrofurantoin     Other reaction(s): Other (See Comments)   Penicillins    Tramadol Nausea Only   Doxycycline Rash   Sulfa Antibiotics Rash    Patient Care Team: Jacky Kindle, FNP as PCP - General (Family Medicine) [place] Crane, MD as Consulting Physician (Ophthalmology) Lady Gary Darlin Priestly, MD as Consulting Physician (Cardiology) Troxler, Molli Hazard, DPM (Inactive) as Attending Physician (Podiatry) Dasher, Cliffton Asters, MD (Dermatology)  Review of Systems    Objective    Vitals: BP 124/65 (BP Location: Left Arm, Patient Position: Sitting, Cuff Size: Normal)   Pulse 82   Ht 5\' 2"  (1.575 m)   Wt 129 lb 1.6 oz (58.6 kg)   SpO2 97%   BMI 23.61 kg/m   Physical Exam Vitals and nursing note reviewed. Exam conducted with a chaperone present.  Constitutional:      General: She is awake. She is not in acute distress.    Appearance: Normal appearance. She is well-developed, well-groomed and normal weight. She is not ill-appearing, toxic-appearing or diaphoretic.  HENT:     Head: Normocephalic and atraumatic.     Jaw: There is normal jaw  occlusion. No trismus, tenderness, swelling or pain on movement.     Right Ear: Hearing, tympanic membrane, ear canal and external ear normal. There is no impacted cerumen.     Left Ear: Hearing, tympanic membrane, ear canal and external ear normal. There is no impacted cerumen.     Nose: Nose normal. No congestion or rhinorrhea.     Right Turbinates: Not enlarged, swollen or pale.     Left Turbinates: Not enlarged, swollen or pale.     Right Sinus: No maxillary sinus tenderness or frontal sinus tenderness.     Left Sinus: No maxillary sinus tenderness or frontal sinus tenderness.     Mouth/Throat:      Lips: Pink.     Mouth: Mucous membranes are moist. No injury.     Tongue: No lesions.     Pharynx: Oropharynx is clear. Uvula midline. No pharyngeal swelling, oropharyngeal exudate, posterior oropharyngeal erythema or uvula swelling.     Tonsils: No tonsillar exudate or tonsillar abscesses.  Eyes:     General: Lids are normal. Lids are everted, no foreign bodies appreciated. Vision grossly intact. Gaze aligned appropriately. No allergic shiner or visual field deficit.       Right eye: No discharge.        Left eye: No discharge.     Extraocular Movements: Extraocular movements intact.     Conjunctiva/sclera: Conjunctivae normal.     Right eye: Right conjunctiva is not injected. No exudate.    Left eye: Left conjunctiva is not injected. No exudate.    Pupils: Pupils are equal, round, and reactive to light.  Neck:     Thyroid: No thyroid mass, thyromegaly or thyroid tenderness.     Vascular: No carotid bruit.     Trachea: Trachea normal.  Cardiovascular:     Rate and Rhythm: Normal rate and regular rhythm.     Pulses: Normal pulses.          Carotid pulses are 2+ on the right side and 2+ on the left side.      Radial pulses are 2+ on the right side and 2+ on the left side.       Dorsalis pedis pulses are 2+ on the right side and 2+ on the left side.       Posterior tibial pulses are 2+ on the right side and 2+ on the left side.     Heart sounds: S1 normal and S2 normal. Murmur heard.     No friction rub. No gallop.  Pulmonary:     Effort: Pulmonary effort is normal. No respiratory distress.     Breath sounds: Normal breath sounds and air entry. No stridor. No wheezing, rhonchi or rales.  Chest:     Chest wall: No tenderness.  Abdominal:     General: Abdomen is flat. Bowel sounds are normal. There is no distension.     Palpations: Abdomen is soft. There is no mass.     Tenderness: There is no abdominal tenderness. There is no right CVA tenderness, left CVA tenderness, guarding or  rebound.     Hernia: No hernia is present.     Comments: Healing surgical sites from incisions and JP; stable. Continue to monitor for redness, warmth, firmness or tenderness.   Genitourinary:    Comments: Exam deferred; denies complaints Musculoskeletal:        General: No swelling, tenderness, deformity or signs of injury. Normal range of motion.     Cervical back: Full passive range  of motion without pain, normal range of motion and neck supple. No edema, rigidity or tenderness. No muscular tenderness.     Right lower leg: No edema.     Left lower leg: No edema.  Lymphadenopathy:     Cervical: No cervical adenopathy.     Right cervical: No superficial, deep or posterior cervical adenopathy.    Left cervical: No superficial, deep or posterior cervical adenopathy.  Skin:    General: Skin is warm and dry.     Capillary Refill: Capillary refill takes less than 2 seconds.     Coloration: Skin is not jaundiced or pale.     Findings: No bruising, erythema, lesion or rash.  Neurological:     General: No focal deficit present.     Mental Status: She is alert and oriented to person, place, and time. Mental status is at baseline.     GCS: GCS eye subscore is 4. GCS verbal subscore is 5. GCS motor subscore is 6.     Sensory: Sensation is intact. No sensory deficit.     Motor: Weakness present.     Coordination: Coordination is intact. Coordination normal.     Gait: Gait is intact. Gait normal.     Comments: Use of walker to assist at this time  Psychiatric:        Attention and Perception: Attention and perception normal.        Mood and Affect: Mood and affect normal.        Speech: Speech normal.        Behavior: Behavior normal. Behavior is cooperative.        Thought Content: Thought content normal.        Cognition and Memory: Cognition and memory normal.        Judgment: Judgment normal.    Most recent functional status assessment:    06/21/2023    1:00 PM  In your present state  of health, do you have any difficulty performing the following activities:  Hearing? 0  Vision? 0  Difficulty concentrating or making decisions? 0  Walking or climbing stairs? 0  Dressing or bathing? 0  Doing errands, shopping? 0   Most recent fall risk assessment:    02/24/2023   10:29 AM  Fall Risk   Falls in the past year? 0  Number falls in past yr: 0  Injury with Fall? 0  Risk for fall due to : History of fall(s)    Most recent depression screenings:    02/24/2023   10:29 AM 09/30/2021    9:18 AM  PHQ 2/9 Scores  PHQ - 2 Score 0 0  PHQ- 9 Score 2 1   Most recent cognitive screening:    05/09/2020    2:26 PM  6CIT Screen  What Year? 0 points  What month? 0 points  What time? 0 points  Count back from 20 0 points  Months in reverse 0 points  Repeat phrase 0 points  Total Score 0 points   Most recent Audit-C alcohol use screening    02/24/2023   10:29 AM  Alcohol Use Disorder Test (AUDIT)  1. How often do you have a drink containing alcohol? 0   A score of 3 or more in women, and 4 or more in men indicates increased risk for alcohol abuse, EXCEPT if all of the points are from question 1   No results found for any visits on 07/13/23.  Assessment & Plan     Annual  wellness visit done today including the all of the following: Reviewed patient's Family Medical History Reviewed and updated list of patient's medical providers Assessment of cognitive impairment was done Assessed patient's functional ability Established a written schedule for health screening services Health Risk Assessent Completed and Reviewed  Exercise Activities and Dietary recommendations  Goals      DIET - EAT MORE FRUITS AND VEGETABLES     Recommend adding in 2 servings each of fruits and vegetables in daily diet.      Increase water intake     Recommend increasing water intake to 6 glasses a day.        Immunization History  Administered Date(s) Administered   Influenza, High  Dose Seasonal PF 09/05/2015   Influenza,inj,Quad PF,6+ Mos 08/14/2013   Influenza-Unspecified 08/31/2017, 08/08/2018   PFIZER(Purple Top)SARS-COV-2 Vaccination 12/26/2019, 01/16/2020   Pneumococcal Conjugate-13 04/02/2014   Pneumococcal Polysaccharide-23 12/08/2004   Zoster, Live 03/03/2010    Health Maintenance  Topic Date Due   DTaP/Tdap/Td (1 - Tdap) Never done   Zoster Vaccines- Shingrix (1 of 2) 01/20/1948   COVID-19 Vaccine (3 - Pfizer risk series) 02/13/2020   OPHTHALMOLOGY EXAM  04/06/2020   INFLUENZA VACCINE  02/14/2024 (Originally 06/17/2023)   DEXA SCAN  04/17/2031 (Originally 03/15/2014)   HEMOGLOBIN A1C  09/07/2023   FOOT EXAM  07/12/2024   Medicare Annual Wellness (AWV)  07/12/2024   Pneumonia Vaccine 40+ Years old  Completed   HPV VACCINES  Aged Out     Discussed health benefits of physical activity, and encouraged her to engage in regular exercise appropriate for her age and condition.    Problem List Items Addressed This Visit       Cardiovascular and Mediastinum   Chronic diastolic CHF (congestive heart failure) (HCC)   Relevant Orders   Magnesium   B Nat Peptide   Hypertension associated with diabetes (HCC)   Relevant Orders   CBC with Differential/Platelet   Comprehensive metabolic panel   TSH     Endocrine   Hyperlipidemia associated with type 2 diabetes mellitus (HCC)   Relevant Orders   Lipid panel   Type 2 diabetes mellitus with hyperglycemia, without long-term current use of insulin (HCC)   Relevant Orders   Hemoglobin A1c     Musculoskeletal and Integument   OP (osteoporosis)   Relevant Orders   VITAMIN D 25 Hydroxy (Vit-D Deficiency, Fractures)     Other   Annual physical exam   Encounter for subsequent annual wellness visit (AWV) in Medicare patient - Primary   Relevant Orders   Flu Vaccine QUAD High Dose(Fluad)   Return in about 3 months (around 10/13/2023) for chonic disease management.    Leilani Merl, FNP, have reviewed  all documentation for this visit. The documentation on 07/13/23 for the exam, diagnosis, procedures, and orders are all accurate and complete.  Jacky Kindle, FNP  [facility] [phone] (phone) [phone] (fax)  [facility]

## 2023-07-13 NOTE — Assessment & Plan Note (Signed)
No complaints; reports ongoing DR visits since April diagnosis and treatment for colon cancer Daughter presents with patient at this time

## 2023-07-13 NOTE — Assessment & Plan Note (Signed)
Chronic, worsening per pt report Repeat A1c given ongoing elevated BG while inpatient Continues on glipizide 10 mg every OTHER day Continue to recommend balanced, lower carb meals. Smaller meal size, adding snacks. Choosing water as drink of choice and increasing purposeful exercise. Pt unable to void for urine micro albumin at this time

## 2023-07-13 NOTE — Assessment & Plan Note (Signed)
Flu vaccine provided; no recent falls Things to do to keep yourself healthy  - Exercise at least 30-45 minutes a day, 3-4 days a week.  - Eat a low-fat diet with lots of fruits and vegetables, up to 7-9 servings per day.  - Seatbelts can save your life. Wear them always.  - Smoke detectors on every level of your home, check batteries every year.  - Eye Doctor - have an eye exam every 1-2 years  - Safe sex - if you may be exposed to STDs, use a condom.  - Alcohol -  If you drink, do it moderately, less than 2 drinks per day.  - Health Care Power of Attorney. Choose someone to speak for you if you are not able.  - Depression is common in our stressful world.If you're feeling down or losing interest in things you normally enjoy, please come in for a visit.  - Violence - If anyone is threatening or hurting you, please call immediately.

## 2023-07-13 NOTE — Patient Instructions (Signed)
The CDC recommends two doses of Shingrix (the new shingles vaccine) separated by 2 to 6 months for adults age 87 years and older. I recommend checking with your insurance plan regarding coverage for this vaccine.    

## 2023-07-13 NOTE — Assessment & Plan Note (Signed)
Chronic, controlled with lipitor 40 mg Repeat LP recommend diet low in saturated fat and regular exercise - 30 min at least 5 times per week Unable to calculate ASCVD risk in setting of age

## 2023-07-13 NOTE — Assessment & Plan Note (Signed)
Chronic, stable Repeat Vit D Use of walker noted during OV

## 2023-07-13 NOTE — Assessment & Plan Note (Signed)
Chronic, stable Pt request to continue restprol 15 mg nightly

## 2023-07-13 NOTE — Assessment & Plan Note (Signed)
Chronic, stable No edema Recommend Mg and BNP Continues to have significant 2-3/6 murmur Continue to monitor

## 2023-07-14 LAB — CBC WITH DIFFERENTIAL/PLATELET
Basophils Absolute: 0 10*3/uL (ref 0.0–0.2)
Basos: 1 %
EOS (ABSOLUTE): 0.2 10*3/uL (ref 0.0–0.4)
Eos: 2 %
Hematocrit: 38.4 % (ref 34.0–46.6)
Hemoglobin: 12.9 g/dL (ref 11.1–15.9)
Immature Grans (Abs): 0 10*3/uL (ref 0.0–0.1)
Immature Granulocytes: 0 %
Lymphocytes Absolute: 1.8 10*3/uL (ref 0.7–3.1)
Lymphs: 24 %
MCH: 30.2 pg (ref 26.6–33.0)
MCHC: 33.6 g/dL (ref 31.5–35.7)
MCV: 90 fL (ref 79–97)
Monocytes Absolute: 0.6 10*3/uL (ref 0.1–0.9)
Monocytes: 7 %
Neutrophils Absolute: 5 10*3/uL (ref 1.4–7.0)
Neutrophils: 66 %
Platelets: 180 10*3/uL (ref 150–450)
RBC: 4.27 x10E6/uL (ref 3.77–5.28)
RDW: 12.8 % (ref 11.7–15.4)
WBC: 7.5 10*3/uL (ref 3.4–10.8)

## 2023-07-14 LAB — LIPID PANEL
Chol/HDL Ratio: 3.7 ratio (ref 0.0–4.4)
Cholesterol, Total: 149 mg/dL (ref 100–199)
HDL: 40 mg/dL (ref >39)
LDL Chol Calc (NIH): 64 mg/dL (ref 0–99)
Triglycerides: 286 mg/dL — ABNORMAL HIGH (ref 0–149)
VLDL Cholesterol Cal: 45 mg/dL — ABNORMAL HIGH (ref 5–40)

## 2023-07-14 LAB — COMPREHENSIVE METABOLIC PANEL
ALT: 20 IU/L (ref 0–32)
AST: 23 IU/L (ref 0–40)
Albumin: 4.3 g/dL (ref 3.6–4.6)
Alkaline Phosphatase: 90 IU/L (ref 44–121)
BUN/Creatinine Ratio: 14 (ref 12–28)
BUN: 10 mg/dL (ref 10–36)
Bilirubin Total: 0.5 mg/dL (ref 0.0–1.2)
CO2: 23 mmol/L (ref 20–29)
Calcium: 10 mg/dL (ref 8.7–10.3)
Chloride: 102 mmol/L (ref 96–106)
Creatinine, Ser: 0.74 mg/dL (ref 0.57–1.00)
Globulin, Total: 2.2 g/dL (ref 1.5–4.5)
Glucose: 220 mg/dL — ABNORMAL HIGH (ref 70–99)
Potassium: 4.5 mmol/L (ref 3.5–5.2)
Sodium: 141 mmol/L (ref 134–144)
Total Protein: 6.5 g/dL (ref 6.0–8.5)
eGFR: 76 mL/min/{1.73_m2} (ref >59)

## 2023-07-14 LAB — MAGNESIUM: Magnesium: 2 mg/dL (ref 1.6–2.3)

## 2023-07-14 LAB — HEMOGLOBIN A1C
Est. average glucose Bld gHb Est-mCnc: 169 mg/dL
Hgb A1c MFr Bld: 7.5 % — ABNORMAL HIGH (ref 4.8–5.6)

## 2023-07-14 LAB — TSH: TSH: 1.8 u[IU]/mL (ref 0.450–4.500)

## 2023-07-14 LAB — VITAMIN D 25 HYDROXY (VIT D DEFICIENCY, FRACTURES): Vit D, 25-Hydroxy: 26.5 ng/mL — ABNORMAL LOW (ref 30.0–100.0)

## 2023-07-14 LAB — BRAIN NATRIURETIC PEPTIDE: BNP: 110 pg/mL — ABNORMAL HIGH (ref 0.0–100.0)

## 2023-07-18 DIAGNOSIS — R42 Dizziness and giddiness: Secondary | ICD-10-CM

## 2023-07-18 HISTORY — DX: Dizziness and giddiness: R42

## 2023-07-20 ENCOUNTER — Emergency Department: Payer: Medicare Other

## 2023-07-20 ENCOUNTER — Observation Stay
Admission: EM | Admit: 2023-07-20 | Discharge: 2023-07-21 | Disposition: A | Discharge status: Home / Self Care | Payer: Medicare Other | Attending: Internal Medicine | Admitting: Internal Medicine

## 2023-07-20 ENCOUNTER — Encounter: Payer: Self-pay | Admitting: Emergency Medicine

## 2023-07-20 ENCOUNTER — Other Ambulatory Visit: Payer: Self-pay

## 2023-07-20 DIAGNOSIS — E785 Hyperlipidemia, unspecified: Secondary | ICD-10-CM | POA: Diagnosis present

## 2023-07-20 DIAGNOSIS — I672 Cerebral atherosclerosis: Secondary | ICD-10-CM | POA: Diagnosis not present

## 2023-07-20 DIAGNOSIS — Z85030 Personal history of malignant carcinoid tumor of large intestine: Secondary | ICD-10-CM | POA: Insufficient documentation

## 2023-07-20 DIAGNOSIS — Z85828 Personal history of other malignant neoplasm of skin: Secondary | ICD-10-CM | POA: Diagnosis not present

## 2023-07-20 DIAGNOSIS — I10 Essential (primary) hypertension: Secondary | ICD-10-CM | POA: Diagnosis not present

## 2023-07-20 DIAGNOSIS — M810 Age-related osteoporosis without current pathological fracture: Secondary | ICD-10-CM | POA: Diagnosis present

## 2023-07-20 DIAGNOSIS — R2990 Unspecified symptoms and signs involving the nervous system: Secondary | ICD-10-CM | POA: Diagnosis present

## 2023-07-20 DIAGNOSIS — Z79899 Other long term (current) drug therapy: Secondary | ICD-10-CM | POA: Insufficient documentation

## 2023-07-20 DIAGNOSIS — R29818 Other symptoms and signs involving the nervous system: Principal | ICD-10-CM | POA: Insufficient documentation

## 2023-07-20 DIAGNOSIS — F5101 Primary insomnia: Secondary | ICD-10-CM | POA: Diagnosis present

## 2023-07-20 DIAGNOSIS — Z87891 Personal history of nicotine dependence: Secondary | ICD-10-CM | POA: Diagnosis not present

## 2023-07-20 DIAGNOSIS — I6523 Occlusion and stenosis of bilateral carotid arteries: Secondary | ICD-10-CM | POA: Diagnosis not present

## 2023-07-20 DIAGNOSIS — Z85118 Personal history of other malignant neoplasm of bronchus and lung: Secondary | ICD-10-CM | POA: Insufficient documentation

## 2023-07-20 DIAGNOSIS — G4700 Insomnia, unspecified: Secondary | ICD-10-CM | POA: Diagnosis not present

## 2023-07-20 DIAGNOSIS — C189 Malignant neoplasm of colon, unspecified: Secondary | ICD-10-CM | POA: Diagnosis present

## 2023-07-20 DIAGNOSIS — E1165 Type 2 diabetes mellitus with hyperglycemia: Secondary | ICD-10-CM | POA: Diagnosis present

## 2023-07-20 DIAGNOSIS — I679 Cerebrovascular disease, unspecified: Secondary | ICD-10-CM

## 2023-07-20 DIAGNOSIS — Z9049 Acquired absence of other specified parts of digestive tract: Secondary | ICD-10-CM | POA: Diagnosis not present

## 2023-07-20 DIAGNOSIS — E1169 Type 2 diabetes mellitus with other specified complication: Secondary | ICD-10-CM | POA: Diagnosis not present

## 2023-07-20 DIAGNOSIS — Z7984 Long term (current) use of oral hypoglycemic drugs: Secondary | ICD-10-CM | POA: Diagnosis not present

## 2023-07-20 DIAGNOSIS — I6621 Occlusion and stenosis of right posterior cerebral artery: Secondary | ICD-10-CM | POA: Diagnosis not present

## 2023-07-20 DIAGNOSIS — Z8673 Personal history of transient ischemic attack (TIA), and cerebral infarction without residual deficits: Secondary | ICD-10-CM

## 2023-07-20 DIAGNOSIS — R42 Dizziness and giddiness: Secondary | ICD-10-CM | POA: Diagnosis not present

## 2023-07-20 DIAGNOSIS — I152 Hypertension secondary to endocrine disorders: Secondary | ICD-10-CM | POA: Diagnosis present

## 2023-07-20 DIAGNOSIS — E1159 Type 2 diabetes mellitus with other circulatory complications: Secondary | ICD-10-CM | POA: Diagnosis present

## 2023-07-20 DIAGNOSIS — I6503 Occlusion and stenosis of bilateral vertebral arteries: Secondary | ICD-10-CM | POA: Diagnosis not present

## 2023-07-20 LAB — BASIC METABOLIC PANEL
Anion gap: 11 (ref 5–15)
BUN: 15 mg/dL (ref 8–23)
CO2: 26 mmol/L (ref 22–32)
Calcium: 9.8 mg/dL (ref 8.9–10.3)
Chloride: 103 mmol/L (ref 98–111)
Creatinine, Ser: 0.78 mg/dL (ref 0.44–1.00)
GFR, Estimated: >60 mL/min (ref >60)
Glucose, Bld: 304 mg/dL — ABNORMAL HIGH (ref 70–99)
Potassium: 4.9 mmol/L (ref 3.5–5.1)
Sodium: 140 mmol/L (ref 135–145)

## 2023-07-20 LAB — URINALYSIS, ROUTINE W REFLEX MICROSCOPIC
Bilirubin Urine: NEGATIVE
Glucose, UA: 500 mg/dL — AB
Hgb urine dipstick: NEGATIVE
Ketones, ur: NEGATIVE mg/dL
Leukocytes,Ua: NEGATIVE
Nitrite: NEGATIVE
Protein, ur: NEGATIVE mg/dL
Specific Gravity, Urine: 1.025 (ref 1.005–1.030)
Squamous Epithelial / HPF: NONE SEEN /HPF (ref 0–5)
pH: 6 (ref 5.0–8.0)

## 2023-07-20 LAB — CBC
HCT: 39.4 % (ref 36.0–46.0)
Hemoglobin: 12.7 g/dL (ref 12.0–15.0)
MCH: 30.2 pg (ref 26.0–34.0)
MCHC: 32.2 g/dL (ref 30.0–36.0)
MCV: 93.6 fL (ref 80.0–100.0)
Platelets: 145 10*3/uL — ABNORMAL LOW (ref 150–400)
RBC: 4.21 MIL/uL (ref 3.87–5.11)
RDW: 14 % (ref 11.5–15.5)
WBC: 6.3 10*3/uL (ref 4.0–10.5)
nRBC: 0 % (ref 0.0–0.2)

## 2023-07-20 LAB — GLUCOSE, CAPILLARY: Glucose-Capillary: 148 mg/dL — ABNORMAL HIGH (ref 70–99)

## 2023-07-20 LAB — CBG MONITORING, ED: Glucose-Capillary: 177 mg/dL — ABNORMAL HIGH (ref 70–99)

## 2023-07-20 MED ORDER — ACETAMINOPHEN 650 MG RE SUPP: 650.0000 mg | Freq: Four times a day (QID) | RECTAL | Status: DC | PRN

## 2023-07-20 MED ORDER — LORAZEPAM 2 MG/ML IJ SOLN
1.0000 mg | Freq: Once | INTRAMUSCULAR | Status: AC | PRN
  Administered 2023-07-20: 1 mg via INTRAVENOUS
  Filled 2023-07-20: qty 1

## 2023-07-20 MED ORDER — HEPARIN SODIUM (PORCINE) 5000 UNIT/ML IJ SOLN
5000.0000 [IU] | Freq: Three times a day (TID) | INTRAMUSCULAR | Status: DC
  Administered 2023-07-20 – 2023-07-21 (×3): 5000 [IU] via SUBCUTANEOUS
  Filled 2023-07-20 (×3): qty 1

## 2023-07-20 MED ORDER — ONDANSETRON HCL 4 MG/2ML IJ SOLN: 4.0000 mg | Freq: Four times a day (QID) | INTRAMUSCULAR | Status: DC | PRN

## 2023-07-20 MED ORDER — INSULIN ASPART 100 UNIT/ML IJ SOLN: 0.0000 [IU] | Freq: Every day | INTRAMUSCULAR | Status: DC

## 2023-07-20 MED ORDER — ACETAMINOPHEN 325 MG PO TABS
650.0000 mg | ORAL_TABLET | Freq: Four times a day (QID) | ORAL | Status: DC | PRN
  Administered 2023-07-21: 650 mg via ORAL
  Filled 2023-07-20: qty 2

## 2023-07-20 MED ORDER — INSULIN ASPART 100 UNIT/ML IJ SOLN
0.0000 [IU] | Freq: Three times a day (TID) | INTRAMUSCULAR | Status: DC
  Administered 2023-07-21 (×3): 2 [IU] via SUBCUTANEOUS
  Filled 2023-07-20 (×3): qty 1

## 2023-07-20 MED ORDER — MECLIZINE HCL 25 MG PO TABS
12.5000 mg | ORAL_TABLET | Freq: Three times a day (TID) | ORAL | Status: DC | PRN
  Administered 2023-07-21: 12.5 mg via ORAL
  Filled 2023-07-20 (×2): qty 0.5

## 2023-07-20 MED ORDER — MECLIZINE HCL 25 MG PO TABS
25.0000 mg | ORAL_TABLET | Freq: Once | ORAL | Status: AC
  Administered 2023-07-20: 25 mg via ORAL
  Filled 2023-07-20: qty 1

## 2023-07-20 MED ORDER — SENNOSIDES-DOCUSATE SODIUM 8.6-50 MG PO TABS: 1.0000 | ORAL_TABLET | Freq: Every evening | ORAL | Status: DC | PRN

## 2023-07-20 MED ORDER — IOHEXOL 350 MG/ML SOLN
75.0000 mL | Freq: Once | INTRAVENOUS | Status: AC | PRN
  Administered 2023-07-20: 75 mL via INTRAVENOUS

## 2023-07-20 MED ORDER — TEMAZEPAM 15 MG PO CAPS
15.0000 mg | ORAL_CAPSULE | Freq: Every day | ORAL | Status: AC
  Administered 2023-07-20: 15 mg via ORAL
  Filled 2023-07-20: qty 1

## 2023-07-20 MED ORDER — SODIUM CHLORIDE 0.9 % IV BOLUS
1000.0000 mL | Freq: Once | INTRAVENOUS | Status: AC
  Administered 2023-07-20: 1000 mL via INTRAVENOUS

## 2023-07-20 MED ORDER — ONDANSETRON HCL 4 MG PO TABS: 4.0000 mg | ORAL_TABLET | Freq: Four times a day (QID) | ORAL | Status: DC | PRN

## 2023-07-20 NOTE — ED Provider Notes (Signed)
[facility] Provider Note    Event Date/Time   First MD Initiated Contact with Patient 07/20/23 1155     (approximate)   History   Dizziness   HPI  [name] is a 89 y.o. female   Past medical history of colon cancer status post resection, non-small cell lung cancer status post radiation not on chemotherapy, history of right pontine stroke, hypertension hyperlipidemia presents to the emergency department with vertigo.  Last known normal 11 PM last night went to sleep feeling well and awoke sometime in the nighttime to use the commode and felt room spinning sensation, laid back in bed, woke this morning with persistent symptoms.    Headache or pain anywhere, denies recent illnesses.  She cannot stand or walk given her symptoms.  Independent Historian contributed to assessment above: Her family members are at bedside to corroborate information given above.  They state that she lives alone.      Physical Exam   Triage Vital Signs: ED Triage Vitals  Encounter Vitals Group     BP 07/20/23 1120 127/88     Systolic BP Percentile --      Diastolic BP Percentile --      Pulse Rate 07/20/23 1120 81     Resp 07/20/23 1120 18     Temp 07/20/23 1120 97.8 F (36.6 C)     Temp Source 07/20/23 1120 Oral     SpO2 07/20/23 1120 96 %     Weight 07/20/23 1123 121 lb (54.9 kg)     Height 07/20/23 1123 5\' 2"  (1.575 m)     Head Circumference --      Peak Flow --      Pain Score 07/20/23 1123 0     Pain Loc --      Pain Education --      Exclude from Growth Chart --     Most recent vital signs: Vitals:   07/20/23 1500 07/20/23 1530  BP: 109/61 129/62  Pulse: 68 71  Resp: 14 18  Temp:    SpO2: 97% 96%    General: Awake, no distress.  CV:  Good peripheral perfusion.  Resp:  Normal effort.  Abd:  No distention.  Other:  Pleasant woman in no acute distress with normal vital signs.  No signs of head trauma.  Neck supple with full range of motion and  moving all extremities with full active range of motion.  No facial asymmetry.  Finger-nose is normal and heel-to-shin is normal.  However upon standing she needs to hold onto me for assistance and when she lets go she has truncal ataxia and falls backwards immediately onto the bed.   ED Results / Procedures / Treatments   Labs (all labs ordered are listed, but only abnormal results are displayed) Labs Reviewed  BASIC METABOLIC PANEL - Abnormal; Notable for the following components:      Result Value   Glucose, Bld 304 (*)    All other components within normal limits  CBC - Abnormal; Notable for the following components:   Platelets 145 (*)    All other components within normal limits  URINALYSIS, ROUTINE W REFLEX MICROSCOPIC - Abnormal; Notable for the following components:   Glucose, UA 500 (*)    Bacteria, UA RARE (*)    All other components within normal limits  CBG MONITORING, ED     I ordered and reviewed the above labs they are notable for she is a blood glucose  of 300, normal anion gap, she has normal cell counts,  EKG  ED ECG REPORT I, Pilar Jarvis, the attending physician, personally viewed and interpreted this ECG.   Date: 07/20/2023  EKG Time: 1125  Rate: 77  Rhythm: sinus  Axis: nl  Intervals:none  ST&T Change: No acute ischemic changes.    RADIOLOGY I independently reviewed and interpreted MRI of the brain see no obvious infarct or bleeding. I also reviewed radiologist's formal read.   PROCEDURES:  Critical Care performed: No  Procedures   MEDICATIONS ORDERED IN ED: Medications  sodium chloride 0.9 % bolus 1,000 mL (1,000 mLs Intravenous New Bag/Given 07/20/23 1250)  meclizine (ANTIVERT) tablet 25 mg (25 mg Oral Given 07/20/23 1251)  iohexol (OMNIPAQUE) 350 MG/ML injection 75 mL (75 mLs Intravenous Contrast Given 07/20/23 1308)  LORazepam (ATIVAN) injection 1 mg (1 mg Intravenous Given 07/20/23 1326)    External physician / consultants:  I spoke with  hospitalist for admit & regarding care plan for this patient.   IMPRESSION / MDM / ASSESSMENT AND PLAN / ED COURSE  I reviewed the triage vital signs and the nursing notes.                                Patient's presentation is most consistent with acute presentation with potential threat to life or bodily function.  Differential diagnosis includes, but is not limited to, peripheral versus central vertigo, dehydration, electrolyte disturbance, dysrhythmia, infection   The patient is on the cardiac monitor to evaluate for evidence of arrhythmia and/or significant heart rate changes.  MDM:    Patient with vertigo with no other focal neurologic findings.  Onset was greater than 12 hours ago outside of thrombolytic window and no evidence of LVO, not stroke code.  History of prior stroke, concern for stroke, obtain CT angiogram of the head and neck as well as an MRI.  Negative for stroke though several severe stenotic lesions within cranial circulation.  Persistent symptoms despite fluids, meclizine, plan for admission, I notified neurology regarding the CT angiogram findings stenosis.       FINAL CLINICAL IMPRESSION(S) / ED DIAGNOSES   Final diagnoses:  Vertigo  Intracranial vascular stenosis     Rx / DC Orders   ED Discharge Orders     None        Note:  This document was prepared using Dragon voice recognition software and may include unintentional dictation errors.    Pilar Jarvis, MD 07/20/23 [phone]

## 2023-07-20 NOTE — Assessment & Plan Note (Signed)
-   Insulin SSI with at bedtime coverage ordered °

## 2023-07-20 NOTE — Hospital Course (Signed)
Ms. Tennelle Bonica is a 89 year old female with history of hypertension, hyperlipidemia, non-insulin-dependent diabetes mellitus, who presents to the emergency department for chief concerns of dizziness.  Vitals in the ED showed temperature of 97.8, respiration rate of 22, heart rate of 80, blood pressure 129/62, SpO2 96% on room air.  Serum sodium is 140, potassium 4.9, chloride 103, bicarb 26, BUN of 15, serum creatinine of 0.78, EGFR greater than 60, nonfasting glucose 304, WBC 6.3, hemoglobin 12.7, platelets of 145.  UA was negative for leukocytes and negative for nitrates.  ED treatment: Sodium chloride 1 L bolus, meclizine 25 mg p.o. one-time dose, lorazepam 1 mg IV one-time dose.

## 2023-07-20 NOTE — ED Triage Notes (Signed)
Patient to ED via POV for dizziness that started last PM while laying in bed. Feeling more tired than normal. Unable to walk without assistance due to dizziness.

## 2023-07-20 NOTE — H&P (Signed)
History and Physical   KENNEDEE SEIDEN ZOX:[id number] DOB: 1929/04/27 DOA: 07/20/2023  PCP: Jacky Kindle, FNP  Patient coming from: Home  I have personally briefly reviewed patient's old medical records in [facility] EMR.  Chief Concern: Dizziness  HPI: [name] is a 89 year old female with history of hypertension, hyperlipidemia, non-insulin-dependent diabetes mellitus, who presents to the emergency department for chief concerns of dizziness.  Vitals in the ED showed temperature of 97.8, respiration rate of 22, heart rate of 80, blood pressure 129/62, SpO2 96% on room air.  Serum sodium is 140, potassium 4.9, chloride 103, bicarb 26, BUN of 15, serum creatinine of 0.78, EGFR greater than 60, nonfasting glucose 304, WBC 6.3, hemoglobin 12.7, platelets of 145.  UA was negative for leukocytes and negative for nitrates.  ED treatment: Sodium chloride 1 L bolus, meclizine 25 mg p.o. one-time dose, lorazepam 1 mg IV one-time dose. ----------------------------- At bedside patient was able to tell me her name, age, location, current calendar year.  She does not appear to be in acute distress.  She reports that she developed dizziness last evening when she was getting ready to go to bed.  She denies recent travel including cruise on a ship.  She denies chest pain, shortness of breath, dysuria, hematuria, diarrhea, blood in her stool.  She reports she is never felt this dizziness before.  She denies trauma to her person including her head.  At bedside, patient was able to follow commands without difficulty.  She further indicates that she enjoyed the [employer] sandwich that her daughters brought for her in the emergency department.  Social history: She denies tobacco, EtOH, recreational drug use.  ROS: Constitutional: no weight change, no fever ENT/Mouth: no sore throat, no rhinorrhea Eyes: no eye pain, no vision changes Cardiovascular: no chest pain, no dyspnea,  no edema, no  palpitations Respiratory: no cough, no sputum, no wheezing Gastrointestinal: no nausea, no vomiting, no diarrhea, no constipation Genitourinary: no urinary incontinence, no dysuria, no hematuria Musculoskeletal: no arthralgias, no myalgias Skin: no skin lesions, no pruritus, Neuro: + weakness, no loss of consciousness, no syncope, + dizziness, vertigo Psych: no anxiety, no depression, no decrease appetite Heme/Lymph: no bruising, no bleeding  ED Course: Discussed with emergency medicine provider, patient requiring hospitalization for chief concerns of dizziness and multiple cerebral arterial stenosis.  Assessment/Plan  Principal Problem:   Stroke-like symptom Active Problems:   History of CVA (cerebrovascular accident)   OP (osteoporosis)   Malignant neoplasm of colon (HCC)   Hypertension associated with diabetes (HCC)   Hyperlipidemia associated with type 2 diabetes mellitus (HCC)   Type 2 diabetes mellitus with hyperglycemia, without long-term current use of insulin (HCC)   Primary insomnia   Assessment and Plan:  * Stroke-like symptom Initial concern was for a stroke MRI of the brain without contrast has been negative Neurology was consulted by EDP who reviewed the CT of the head and neck and MRI and responded: Since MRI is negative, patient can be seen by outpatient ENT.  Differential diagnosis seems in favor of peripheral vestibulopathy.  If no neurological deficits other than subjective vertigo with or without nystagmus.  Unless there is a stroke or TIA, per literature aspirin is not indicated for asymptomatic intracranial atherosclerosis due to bleeding risk exceeding documented benefits regarding stroke risk reduction. Meclizine 12.5 mg 3 times daily as needed for dizziness, 2 days ordered Recommend outpatient evaluation by ENT if symptoms persist  Type 2 diabetes mellitus with hyperglycemia, without long-term  current use of insulin (HCC) Insulin SSI with at bedtime  coverage ordered  Hyperlipidemia associated with type 2 diabetes mellitus (HCC) Atorvastatin 40 mg daily resumed  Hypertension associated with diabetes (HCC) Home amlodipine 10 mg daily resumed  Malignant neoplasm of colon (HCC) Status post robotic assisted laparoscopic right hemicolectomy on 06/21/2023  History of CVA (cerebrovascular accident) Patient is on aspirin 81 mg daily, this has been resumed as this is patient's regular medication, atorvastatin 40 mg daily resumed  Chart reviewed.   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   a.) CT chest 03/09/2023: fusiform dilatation measuring 4.3 cm   Basal cell carcinoma (BCC) of left medial cheek    a.) s/p Mohs surgery 07/13/2022 at [facility]   Bilateral carotid artery disease [facility])    a.) doppler 04/03/2007: 50-75% RICA; b.) s/p LEFT CEA with CorMatrix patch angioplasty 01/18/2015; c.) doppler 11/02/2016: 40-59% RICA; d.) doppler 01/10/2018: 1-39% RICA   Diverticulosis 08/05/2015   Eczema of both upper extremities 06/10/2021   Heart failure with preserved left ventricular function (HFpEF) (HCC)    a.) TTE 05/17/2017: EF >55%, mild LVH, mild LAE, mild panval regurg; b.) TTE 06/27/2018 : EF >55%,  mod LVH, mild LAE, mild panval regurg'; G1dd; c.) TTE 12/07/2019: EF >55%, mild LVH, mild MAC, mild panval regurg; d.) TTE 01/27/2021: EF >55%, mod LVH, triv TR, mild AR/MR, G1DD   Heart murmur    History of right cataract extraction    Hyperlipidemia    Hypertension    Insomnia    a.) on BZO (temazepam) PRN   Irritable bowel syndrome with diarrhea 04/20/2017   LBP (low back pain) 08/05/2015   Melanoma (HCC)    a.) s/p Mohs surgery 02/06/2020 - LEFT medial brow --> V to Y advancement flap   Melena    Non-small cell lung cancer (NSCLC) (HCC) 03/09/2023   a.) CT chest 03/09/2023 --> superior segment LLL --> 2.1 x 1.7 x 2.1 cm mixed ground-glass and subpleural nodule extending to pleura with irreg margins --> concerning for primary bronchogenic carcinoma rather than metastatic disease; b.) 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 (60 Gy over 6 fractions)   OAB (overactive bladder)    PONV (postoperative nausea and vomiting)    a.) single episode following cataract surgery   PSVT (paroxysmal supraventricular tachycardia) 02/22/2015   a.) holter 02/22/2015: 6 runs with longest lasting 10 beats   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   a.) MRI brain 04/02/2007 - subacute RIGHT pontine CVA   Stroke (HCC) 12/16/2014   a.) MRI brain 12/16/2014: punctate foci of restricted diffusion x 5 consistent with microembolic infarctions affecting the RIGHT lateral medulla, LEFT temporal lobe, 2 foci in the RIGHT thalamus, and LEFT inferior  frontal lobe/basal ganglia --> distribution would suggest cardiac  or ascending aortic source   T2DM (type 2 diabetes mellitus) (HCC)    Tubular adenoma of colon    Past Surgical History:  Procedure Laterality Date   APPENDECTOMY     BREAST BIOPSY Right    neg   BREAST SURGERY     1980's   CAROTID ENDARTERECTOMY Left 01/18/2015   Procedure: CAROTID ENDARTERECTOMY; Location: [facility]; Surgeon: [name], MD   CATARACT EXTRACTION Right 2012   CHOLECYSTECTOMY   06/27/2012   COLONOSCOPY WITH PROPOFOL N/A 02/17/2023   Procedure: COLONOSCOPY WITH PROPOFOL;  Surgeon: Regis Bill, MD;  Location: [facility] ENDOSCOPY;  Service: Endoscopy;  Laterality: N/A;   DECOMPRESSIVE LUMBAR LAMINECTOMY LEVEL 1  1950   ESOPHAGOGASTRODUODENOSCOPY (EGD) WITH PROPOFOL N/A 02/16/2023   Procedure: ESOPHAGOGASTRODUODENOSCOPY (EGD) WITH PROPOFOL;  Surgeon: Regis Bill, MD;  Location: [facility] ENDOSCOPY;  Service: Endoscopy;  Laterality: N/A;   MELANOMA EXCISION Left 02/06/2020   Procedure: MOHS SURGERY WITH V TO Y ADVANCEMENT FLAP (LEFT MEDIAL EYEBROW); Location: [facility]   MOHS SURGERY Left 07/13/2022   Procedure: MOHS SURGERY (BCC LEFT MEDIAL CHEEK); Location: [facility]   Social History:  reports that she quit smoking about 46 years ago. Her smoking use included cigarettes. She has never used smokeless tobacco. She reports that she does not drink alcohol and does not use drugs.  Allergies  Allergen Reactions   Cephalosporins     "upset stomach"   Codeine    Macrolides And Ketolides     "upset stomach" Other reaction(s): Unknown "upset stomach"   Nitrofurantoin     Other reaction(s): Other (See Comments)   Penicillins    Tramadol Nausea Only   Doxycycline Rash   Sulfa Antibiotics Rash   Family History  Problem Relation Age of Onset   Ovarian cancer Mother    Heart disease Brother    Heart attack Brother    Gout Brother    Alzheimer's disease Brother    Dementia Brother    Heart attack Brother    Diabetes Daughter    Melanoma Daughter    Stroke Son    Breast cancer Neg Hx    Family history: Family history reviewed and not pertinent  Prior to Admission medications   Medication Sig Start Date End Date Taking? Authorizing Provider  acetaminophen (TYLENOL) 325 MG tablet Take 2 tablets (650 mg total) by mouth every 6 (six) hours as needed for mild pain, moderate pain, fever or headache. 06/25/23   Tonna Boehringer, Isami, DO  amLODipine (NORVASC) 10 MG tablet Take 1 tablet  (10 mg total) by mouth daily. 07/13/23   Jacky Kindle, FNP  atorvastatin (LIPITOR) 40 MG tablet Take 1 tablet (40 mg total) by mouth daily. 07/13/23   Jacky Kindle, FNP  cholecalciferol (VITAMIN D) 1000 UNITS tablet Take 1,000 Units by mouth daily.     [provider]  erythromycin ophthalmic ointment 1 Application at bedtime.    [provider]  glipiZIDE (GLUCOTROL XL) 10 MG 24 hr tablet Take 1 tablet (10 mg total) by mouth every other day. 07/22/22   Jacky Kindle, FNP  hydrochlorothiazide (HYDRODIURIL) 12.5 MG tablet Take 1 tablet (12.5 mg total) by mouth every other day. 07/22/22   Jacky Kindle, FNP  ibuprofen (ADVIL) 100 MG tablet Take 100 mg by mouth every 6 (six) hours as needed for fever.    [provider]  ketorolac (TORADOL) 10 MG tablet Take 1 tablet (10 mg total) by mouth every 6 (six) hours as needed. 06/26/23  Carolan Shiver, MD  [facility] Apply 1 drop to eye as needed.    [provider]  oxybutynin (DITROPAN-XL) 10 MG 24 hr tablet Take 10 mg by mouth at bedtime.    [provider]  temazepam (RESTORIL) 15 MG capsule TAKE 1 CAPSULE BY MOUTH AT BEDTIME AS NEEDED FOR SLEEP 07/13/23   Jacky Kindle, FNP   Physical Exam: Vitals:   07/20/23 1530 07/20/23 1630 07/20/23 2130 07/20/23 2244  BP: 129/62 127/68 (!) 117/58 132/63  Pulse: 71 70 69 74  Resp: 18 17 17    Temp:   98 F (36.7 C) 98.3 F (36.8 C)  TempSrc:   Oral   SpO2: 96% 96% 99% 98%  Weight:      Height:       Constitutional: appears age-appropriate, frail Eyes: Negative for nystagmus, PERRL, lids and conjunctivae normal ENMT: Mucous membranes are moist. Posterior pharynx clear of any exudate or lesions. Age-appropriate dentition. Hearing appropriate Neck: normal, supple, no masses, no thyromegaly Respiratory: clear to auscultation bilaterally, no wheezing, no crackles. Normal respiratory effort. No accessory muscle use.  Cardiovascular: Regular rate  and rhythm, no murmurs / rubs / gallops. No extremity edema. 2+ pedal pulses. No carotid bruits.  Abdomen: Mild tenderness at the right lower quadrant, no masses palpated, no hepatosplenomegaly. Bowel sounds positive.  Musculoskeletal: no clubbing / cyanosis. No joint deformity upper and lower extremities. Good ROM, no contractures, no atrophy. Normal muscle tone.  Skin: no rashes, lesions, ulcers. No induration Neurologic: Sensation intact. Strength 5/5 in all 4.  Patient able to follow commands without difficulty. Psychiatric: Normal judgment and insight. Alert and oriented x 3. Normal mood.   EKG: independently reviewed, showing sinus rhythm with rate of 77, QTc 443  Chest x-ray on Admission: I personally reviewed and I agree with radiologist reading as below.  MR Brain Wo Contrast (neuro protocol)  Result Date: 07/20/2023 CLINICAL DATA:  Neuro deficit, acute, stroke suspected central vertigo? EXAM: MRI HEAD WITHOUT CONTRAST TECHNIQUE: Multiplanar, multiecho pulse sequences of the brain and surrounding structures were obtained without intravenous contrast. COMPARISON:  CTA head/neck from today. FINDINGS: Brain: No acute infarction, hemorrhage, hydrocephalus, extra-axial collection or mass lesion. Vascular: Major arterial flow voids are maintained. Skull and upper cervical spine: Normal marrow signal. Sinuses/Orbits: Clear sinuses.  No acute orbital findings. Other: No mastoid effusions. IMPRESSION: No evidence of acute abnormality. Electronically Signed   By: Feliberto Harts M.D.   On: 07/20/2023 15:48   CT Angio Head Neck W WO CM  Result Date: 07/20/2023 CLINICAL DATA:  Neuro deficit, acute, stroke suspected EXAM: CT ANGIOGRAPHY HEAD AND NECK WITH AND WITHOUT CONTRAST TECHNIQUE: Multidetector CT imaging of the head and neck was performed using the standard protocol during bolus administration of intravenous contrast. Multiplanar CT image reconstructions and MIPs were obtained to evaluate the  vascular anatomy. Carotid stenosis measurements (when applicable) are obtained utilizing NASCET criteria, using the distal internal carotid diameter as the denominator. RADIATION DOSE REDUCTION: This exam was performed according to the departmental dose-optimization program which includes automated exposure control, adjustment of the mA and/or kV according to patient size and/or use of iterative reconstruction technique. CONTRAST:  75mL OMNIPAQUE IOHEXOL 350 MG/ML SOLN COMPARISON:  CTA Head and neck 12/16/2014. FINDINGS: CT HEAD FINDINGS Brain: No evidence of acute infarction, hemorrhage, hydrocephalus, extra-axial collection or mass lesion/mass effect. Vascular: No hyperdense vessel. Skull: No acute fracture. Sinuses/Orbits: Mostly clear sinuses.  No acute orbital findings. Other: No mastoid effusions. Review of the MIP  images confirms the above findings CTA NECK FINDINGS Aortic arch: Atherosclerosis.  Great vessel origins are patent. Right carotid system: Atherosclerosis at the carotid bifurcation involving the proximal ICA with approximately 70% stenosis of the ICA origin. Left carotid system: No evidence of dissection, stenosis (50% or greater), or occlusion. Vertebral arteries: Moderate right vertebral artery origin stenosis. Right dominant vertebral artery. Otherwise, vertebral arteries are patent without greater than 50% stenosis. Skeleton: No acute abnormality on limited assessment. Other neck: No acute abnormality on limited assessment. Upper chest: Known superior left lower lobe nodule better characterized on recent CT of the chest. Review of the MIP images confirms the above findings CTA HEAD FINDINGS Anterior circulation: Bilateral intracranial ICAs, MCAs, and ACAs are patent. Severe bilateral paraclinoid ICA stenosis. Severe left distal M2 MCA stenosis. Posterior circulation: Bilateral intradural vertebral arteries, basilar artery and bilateral posterior cerebral arteries are patent. Severe left  intradural vertebral artery stenosis. Severe right P2 PCA stenosis. Venous sinuses: As permitted by contrast timing, patent. Review of the MIP images confirms the above findings IMPRESSION: 1. Severe bilateral paraclinoid ICA stenosis. 2. Severe left intradural vertebral artery stenosis. 3. Severe right P2 PCA stenosis. 4. Severe distal left M2 MCA stenosis. 5. Approximately 70% stenosis of the right ICA origin in the neck. 6. Moderate right vertebral artery origin stenosis. 7. Known superior left lower lobe nodule better characterized on recent CT of the chest. Electronically Signed   By: Feliberto Harts M.D.   On: 07/20/2023 15:44    Labs on Admission: I have personally reviewed following labs CBC: Recent Labs  Lab 07/20/23 1125  WBC 6.3  HGB 12.7  HCT 39.4  MCV 93.6  PLT 145*   Basic Metabolic Panel: Recent Labs  Lab 07/20/23 1125  NA 140  K 4.9  CL 103  CO2 26  GLUCOSE 304*  BUN 15  CREATININE 0.78  CALCIUM 9.8   GFR: Estimated Creatinine Clearance: 35.5 mL/min (by C-G formula based on SCr of 0.78 mg/dL).  Urine analysis:    Component Value Date/Time   COLORURINE YELLOW 07/20/2023 1212   APPEARANCEUR CLEAR 07/20/2023 1212   APPEARANCEUR Clear 06/13/2021 1019   LABSPEC 1.025 07/20/2023 1212   LABSPEC 1.010 12/15/2014 1926   PHURINE 6.0 07/20/2023 1212   GLUCOSEU 500 (A) 07/20/2023 1212   GLUCOSEU NEGATIVE 12/15/2014 1926   HGBUR NEGATIVE 07/20/2023 1212   BILIRUBINUR NEGATIVE 07/20/2023 1212   BILIRUBINUR Negative 06/13/2021 1019   BILIRUBINUR NEGATIVE 12/15/2014 1926   KETONESUR NEGATIVE 07/20/2023 1212   PROTEINUR NEGATIVE 07/20/2023 1212   NITRITE NEGATIVE 07/20/2023 1212   LEUKOCYTESUR NEGATIVE 07/20/2023 1212   LEUKOCYTESUR NEGATIVE 12/15/2014 1926   This document was prepared using Dragon Voice Recognition software and may include unintentional dictation errors.  Dr. Sedalia Muta [facility]  If 7PM-7AM, please contact overnight-coverage provider If  7AM-7PM, please contact day attending provider [email or web address]  07/21/2023, 12:14 AM

## 2023-07-21 DIAGNOSIS — E1169 Type 2 diabetes mellitus with other specified complication: Secondary | ICD-10-CM

## 2023-07-21 DIAGNOSIS — I152 Hypertension secondary to endocrine disorders: Secondary | ICD-10-CM

## 2023-07-21 DIAGNOSIS — E1165 Type 2 diabetes mellitus with hyperglycemia: Secondary | ICD-10-CM

## 2023-07-21 DIAGNOSIS — R42 Dizziness and giddiness: Secondary | ICD-10-CM | POA: Diagnosis not present

## 2023-07-21 DIAGNOSIS — Z8673 Personal history of transient ischemic attack (TIA), and cerebral infarction without residual deficits: Secondary | ICD-10-CM | POA: Diagnosis not present

## 2023-07-21 DIAGNOSIS — R2990 Unspecified symptoms and signs involving the nervous system: Secondary | ICD-10-CM | POA: Diagnosis not present

## 2023-07-21 DIAGNOSIS — E1159 Type 2 diabetes mellitus with other circulatory complications: Secondary | ICD-10-CM

## 2023-07-21 DIAGNOSIS — E785 Hyperlipidemia, unspecified: Secondary | ICD-10-CM

## 2023-07-21 DIAGNOSIS — F5101 Primary insomnia: Secondary | ICD-10-CM

## 2023-07-21 LAB — GLUCOSE, CAPILLARY
Glucose-Capillary: 159 mg/dL — ABNORMAL HIGH (ref 70–99)
Glucose-Capillary: 166 mg/dL — ABNORMAL HIGH (ref 70–99)
Glucose-Capillary: 165 mg/dL — ABNORMAL HIGH (ref 70–99)
Glucose-Capillary: 168 mg/dL — ABNORMAL HIGH (ref 70–99)

## 2023-07-21 LAB — CBC
HCT: 34.2 % — ABNORMAL LOW (ref 36.0–46.0)
Hemoglobin: 11.2 g/dL — ABNORMAL LOW (ref 12.0–15.0)
MCH: 29.9 pg (ref 26.0–34.0)
MCHC: 32.7 g/dL (ref 30.0–36.0)
MCV: 91.4 fL (ref 80.0–100.0)
Platelets: 125 10*3/uL — ABNORMAL LOW (ref 150–400)
RBC: 3.74 MIL/uL — ABNORMAL LOW (ref 3.87–5.11)
RDW: 14.2 % (ref 11.5–15.5)
WBC: 4.8 10*3/uL (ref 4.0–10.5)
nRBC: 0 % (ref 0.0–0.2)

## 2023-07-21 LAB — BASIC METABOLIC PANEL
Anion gap: 8 (ref 5–15)
BUN: 15 mg/dL (ref 8–23)
CO2: 22 mmol/L (ref 22–32)
Calcium: 8.6 mg/dL — ABNORMAL LOW (ref 8.9–10.3)
Chloride: 108 mmol/L (ref 98–111)
Creatinine, Ser: 0.78 mg/dL (ref 0.44–1.00)
GFR, Estimated: >60 mL/min (ref >60)
Glucose, Bld: 139 mg/dL — ABNORMAL HIGH (ref 70–99)
Potassium: 3.8 mmol/L (ref 3.5–5.1)
Sodium: 138 mmol/L (ref 135–145)

## 2023-07-21 MED ORDER — ATORVASTATIN CALCIUM 20 MG PO TABS
40.0000 mg | ORAL_TABLET | Freq: Every day | ORAL | Status: DC
  Administered 2023-07-21: 40 mg via ORAL
  Filled 2023-07-21: qty 2

## 2023-07-21 MED ORDER — ASPIRIN 81 MG PO TBEC
81.0000 mg | DELAYED_RELEASE_TABLET | Freq: Every day | ORAL | Status: DC
  Administered 2023-07-21: 81 mg via ORAL
  Filled 2023-07-21: qty 1

## 2023-07-21 MED ORDER — AMLODIPINE BESYLATE 10 MG PO TABS
10.0000 mg | ORAL_TABLET | Freq: Every day | ORAL | Status: DC
  Administered 2023-07-21: 10 mg via ORAL
  Filled 2023-07-21: qty 1

## 2023-07-21 MED ORDER — MECLIZINE HCL 12.5 MG PO TABS: 12.5000 mg | ORAL_TABLET | Freq: Three times a day (TID) | ORAL | 0 refills | Status: DC | PRN

## 2023-07-21 MED ORDER — VITAMIN D 25 MCG (1000 UNIT) PO TABS
1000.0000 [IU] | ORAL_TABLET | Freq: Every day | ORAL | Status: DC
  Administered 2023-07-21: 1000 [IU] via ORAL
  Filled 2023-07-21: qty 1

## 2023-07-21 MED ORDER — ADULT MULTIVITAMIN W/MINERALS CH
1.0000 | ORAL_TABLET | Freq: Every day | ORAL | Status: DC
  Administered 2023-07-21: 1 via ORAL
  Filled 2023-07-21: qty 1

## 2023-07-21 MED ORDER — ENSURE ENLIVE PO LIQD
237.0000 mL | Freq: Two times a day (BID) | ORAL | Status: DC
  Administered 2023-07-21: 237 mL via ORAL

## 2023-07-21 NOTE — Care Management Obs Status (Signed)
MEDICARE OBSERVATION STATUS NOTIFICATION   Patient Details  Name: [name] MRN: [id number] Date of Birth: [birth year]   Medicare Observation Status Notification Given:  Yes    Braxston Quinter, LCSW 07/21/2023, 4:37 PM

## 2023-07-21 NOTE — Discharge Summary (Signed)
Physician Discharge Summary   Patient: [name] MRN: [id number] DOB: [birth year]  Admit date:     07/20/2023  Discharge date: 07/21/23  Discharge Physician: Marcelino Duster   PCP: Jacky Kindle, FNP   Recommendations at discharge:  {Tip this will not be part of the note when signed- Example include specific recommendations for outpatient follow-up, pending tests to follow-up on. (Optional):26781}  PCP follow up in  1 week. Echo cardiogram as outpatient. ENT evaluation suggested.  Discharge Diagnoses: Principal Problem:   Stroke-like symptom Active Problems:   History of CVA (cerebrovascular accident)   OP (osteoporosis)   Malignant neoplasm of colon (HCC)   Hypertension associated with diabetes (HCC)   Hyperlipidemia associated with type 2 diabetes mellitus (HCC)   Type 2 diabetes mellitus with hyperglycemia, without long-term current use of insulin (HCC)   Primary insomnia   Dizziness  Resolved Problems:   * No resolved hospital problems. [facility] Course: [name] is a 89 year old female with history of hypertension, hyperlipidemia, non-insulin-dependent diabetes mellitus, who presents to the emergency department for chief concerns of dizziness.  Vitals in the ED showed temperature of 97.8, respiration rate of 22, heart rate of 80, blood pressure 129/62, SpO2 96% on room air.  Serum sodium is 140, potassium 4.9, chloride 103, bicarb 26, BUN of 15, serum creatinine of 0.78, EGFR greater than 60, nonfasting glucose 304, WBC 6.3, hemoglobin 12.7, platelets of 145.  UA was negative for leukocytes and negative for nitrates.  ED treatment: Sodium chloride 1 L bolus, meclizine 25 mg p.o. one-time dose, lorazepam 1 mg IV one-time dose.  Assessment and Plan: * Stroke-like symptom Initial concern was for a stroke MRI of the brain without contrast has been negative Neurology was consulted by EDP who reviewed the CT of the head and neck and MRI and responded:  Since MRI is negative, patient can be seen by outpatient ENT.  Differential diagnosis seems in favor of peripheral vestibulopathy.  If no neurological deficits other than subjective vertigo with or without nystagmus.  Unless there is a stroke or TIA, per literature aspirin is not indicated for asymptomatic intracranial atherosclerosis due to bleeding risk exceeding documented benefits regarding stroke risk reduction. Meclizine 12.5 mg 3 times daily as needed for dizziness, 2 days ordered Recommend outpatient evaluation by ENT if symptoms persist  Type 2 diabetes mellitus with hyperglycemia, without long-term current use of insulin (HCC) Insulin SSI with at bedtime coverage ordered  Hyperlipidemia associated with type 2 diabetes mellitus (HCC) Atorvastatin 40 mg daily resumed  Hypertension associated with diabetes (HCC) Home amlodipine 10 mg daily resumed  Malignant neoplasm of colon (HCC) Status post robotic assisted laparoscopic right hemicolectomy on 06/21/2023  History of CVA (cerebrovascular accident) Patient is on aspirin 81 mg daily, this has been resumed as this is patient's regular medication, atorvastatin 40 mg daily resumed      {Tip this will not be part of the note when signed Body mass index is 22.13 kg/m. ,  Nutrition Documentation    Flowsheet Row ED to Hosp-Admission (Current) from 07/20/2023 in [facility] 1C MEDICAL TELEMETRY  Nutrition Problem Increased nutrient needs  Etiology acute illness  Nutrition Goal Patient will meet greater than or equal to 90% of their needs  Interventions Liberalize Diet, MVI, Ensure Enlive (each supplement provides 350kcal and 20 grams of protein)     ,  (Optional):26781}  {(NOTE) Pain control PDMP Statment (Optional):26782} Consultants: *** Procedures performed: ***  Disposition: {Plan; Disposition:26390}  Diet recommendation:  Discharge Diet Orders (From admission, onward)     Start     Ordered   07/21/23  0000  Diet - low sodium heart healthy        07/21/23 1740   07/21/23 0000  Diet Carb Modified        07/21/23 1740           {Diet_Plan:26776} DISCHARGE MEDICATION: Allergies as of 07/21/2023       Reactions   Cephalosporins    "upset stomach"   Codeine    Macrolides And Ketolides    "upset stomach" Other reaction(s): Unknown "upset stomach"   Nitrofurantoin    Other reaction(s): Other (See Comments)   Penicillins    Tramadol Nausea Only   Doxycycline Rash   Sulfa Antibiotics Rash        Medication List     STOP taking these medications    ibuprofen 100 MG tablet Commonly known as: ADVIL   ketorolac 10 MG tablet Commonly known as: TORADOL   oxybutynin 10 MG 24 hr tablet Commonly known as: DITROPAN-XL   traMADol 50 MG tablet Commonly known as: ULTRAM       TAKE these medications    acetaminophen 325 MG tablet Commonly known as: TYLENOL Take 2 tablets (650 mg total) by mouth every 6 (six) hours as needed for mild pain, moderate pain, fever or headache.   amLODipine 10 MG tablet Commonly known as: NORVASC Take 1 tablet (10 mg total) by mouth daily.   aspirin EC 81 MG tablet Take 81 mg by mouth daily. Swallow whole.   atorvastatin 40 MG tablet Commonly known as: LIPITOR Take 1 tablet (40 mg total) by mouth daily.   cholecalciferol 25 MCG (1000 UNIT) tablet Commonly known as: VITAMIN D3 Take 1,000 Units by mouth daily.   erythromycin ophthalmic ointment 1 Application at bedtime.   glipiZIDE 10 MG 24 hr tablet Commonly known as: GLUCOTROL XL Take 1 tablet (10 mg total) by mouth every other day.   hydrochlorothiazide 12.5 MG tablet Commonly known as: HYDRODIURIL Take 1 tablet (12.5 mg total) by mouth every other day.   meclizine 12.5 MG tablet Commonly known as: ANTIVERT Take 1 tablet (12.5 mg total) by mouth 3 (three) times daily as needed for dizziness.   NEOMYCIN-POLYMYXIN-HC OP Apply 1 drop to eye as needed.   temazepam 15 MG  capsule Commonly known as: RESTORIL TAKE 1 CAPSULE BY MOUTH AT BEDTIME AS NEEDED FOR SLEEP        Discharge Exam: Filed Weights   07/20/23 1123  Weight: 54.9 kg   ***  Condition at discharge: {DC Condition:26389}  The results of significant diagnostics from this hospitalization (including imaging, microbiology, ancillary and laboratory) are listed below for reference.   Imaging Studies: MR Brain Wo Contrast (neuro protocol)  Result Date: 07/20/2023 CLINICAL DATA:  Neuro deficit, acute, stroke suspected central vertigo? EXAM: MRI HEAD WITHOUT CONTRAST TECHNIQUE: Multiplanar, multiecho pulse sequences of the brain and surrounding structures were obtained without intravenous contrast. COMPARISON:  CTA head/neck from today. FINDINGS: Brain: No acute infarction, hemorrhage, hydrocephalus, extra-axial collection or mass lesion. Vascular: Major arterial flow voids are maintained. Skull and upper cervical spine: Normal marrow signal. Sinuses/Orbits: Clear sinuses.  No acute orbital findings. Other: No mastoid effusions. IMPRESSION: No evidence of acute abnormality. Electronically Signed   By: Feliberto Harts M.D.   On: 07/20/2023 15:48   CT Angio Head Neck W WO CM  Result Date: 07/20/2023 CLINICAL DATA:  Neuro deficit,  acute, stroke suspected EXAM: CT ANGIOGRAPHY HEAD AND NECK WITH AND WITHOUT CONTRAST TECHNIQUE: Multidetector CT imaging of the head and neck was performed using the standard protocol during bolus administration of intravenous contrast. Multiplanar CT image reconstructions and MIPs were obtained to evaluate the vascular anatomy. Carotid stenosis measurements (when applicable) are obtained utilizing NASCET criteria, using the distal internal carotid diameter as the denominator. RADIATION DOSE REDUCTION: This exam was performed according to the departmental dose-optimization program which includes automated exposure control, adjustment of the mA and/or kV according to patient size  and/or use of iterative reconstruction technique. CONTRAST:  75mL OMNIPAQUE IOHEXOL 350 MG/ML SOLN COMPARISON:  CTA Head and neck 12/16/2014. FINDINGS: CT HEAD FINDINGS Brain: No evidence of acute infarction, hemorrhage, hydrocephalus, extra-axial collection or mass lesion/mass effect. Vascular: No hyperdense vessel. Skull: No acute fracture. Sinuses/Orbits: Mostly clear sinuses.  No acute orbital findings. Other: No mastoid effusions. Review of the MIP images confirms the above findings CTA NECK FINDINGS Aortic arch: Atherosclerosis.  Great vessel origins are patent. Right carotid system: Atherosclerosis at the carotid bifurcation involving the proximal ICA with approximately 70% stenosis of the ICA origin. Left carotid system: No evidence of dissection, stenosis (50% or greater), or occlusion. Vertebral arteries: Moderate right vertebral artery origin stenosis. Right dominant vertebral artery. Otherwise, vertebral arteries are patent without greater than 50% stenosis. Skeleton: No acute abnormality on limited assessment. Other neck: No acute abnormality on limited assessment. Upper chest: Known superior left lower lobe nodule better characterized on recent CT of the chest. Review of the MIP images confirms the above findings CTA HEAD FINDINGS Anterior circulation: Bilateral intracranial ICAs, MCAs, and ACAs are patent. Severe bilateral paraclinoid ICA stenosis. Severe left distal M2 MCA stenosis. Posterior circulation: Bilateral intradural vertebral arteries, basilar artery and bilateral posterior cerebral arteries are patent. Severe left intradural vertebral artery stenosis. Severe right P2 PCA stenosis. Venous sinuses: As permitted by contrast timing, patent. Review of the MIP images confirms the above findings IMPRESSION: 1. Severe bilateral paraclinoid ICA stenosis. 2. Severe left intradural vertebral artery stenosis. 3. Severe right P2 PCA stenosis. 4. Severe distal left M2 MCA stenosis. 5. Approximately 70%  stenosis of the right ICA origin in the neck. 6. Moderate right vertebral artery origin stenosis. 7. Known superior left lower lobe nodule better characterized on recent CT of the chest. Electronically Signed   By: Feliberto Harts M.D.   On: 07/20/2023 15:44   DG Abd 1 View  Result Date: 06/23/2023 CLINICAL DATA:  Abdominal distention EXAM: ABDOMEN - 1 VIEW COMPARISON:  None Available. FINDINGS: Surgical clips in the right upper quadrant. There is a drain in the right lateral hemiabdomen with the adjacent bowel staple line. Skin staples along the left mid hemiabdomen. Gas seen in nondilated loops of small and large bowel. No frank obstruction. No obvious free air on this portable supine radiograph. The left hemi abdominal edge is clipped off the edge of the film. Vascular calcifications are seen. IMPRESSION: Postoperative changes identified. Drain in place along the lateral right hemiabdomen. Nonspecific bowel gas pattern Electronically Signed   By: Karen Kays M.D.   On: 06/23/2023 10:59    Microbiology: Results for orders placed or performed in visit on 09/18/19  Herpes simplex virus culture     Status: None   Collection Time: 09/18/19  4:30 PM   Specimen: Other   VA  Result Value Ref Range Status   HSV Culture/Type Comment  Final    Comment: Negative No Herpes simplex virus isolated.  Labs: CBC: Recent Labs  Lab 07/20/23 1125 07/21/23 0619  WBC 6.3 4.8  HGB 12.7 11.2*  HCT 39.4 34.2*  MCV 93.6 91.4  PLT 145* 125*   Basic Metabolic Panel: Recent Labs  Lab 07/20/23 1125 07/21/23 0619  NA 140 138  K 4.9 3.8  CL 103 108  CO2 26 22  GLUCOSE 304* 139*  BUN 15 15  CREATININE 0.78 0.78  CALCIUM 9.8 8.6*   Liver Function Tests: No results for input(s): "AST", "ALT", "ALKPHOS", "BILITOT", "PROT", "ALBUMIN" in the last 168 hours. CBG: Recent Labs  Lab 07/20/23 2253 07/21/23 0723 07/21/23 1152 07/21/23 1542 07/21/23 1711  GLUCAP 148* 159* 166* 165* 168*     Discharge time spent: 33 minutes.  Signed: Marcelino Duster, MD [facility] 07/21/2023

## 2023-07-21 NOTE — Care Management CC44 (Signed)
Condition Code 44 Documentation Completed  Patient Details  Name: MERRIANN FOGEL MRN: [id number] Date of Birth: 12-Sep-1929   Condition Code 44 given:  Yes Patient signature on Condition Code 44 notice:  Yes Documentation of 2 MD's agreement:  Yes Code 44 added to claim:  Yes    Allena Katz, LCSW 07/21/2023, 4:37 PM

## 2023-07-21 NOTE — Assessment & Plan Note (Signed)
Status post robotic assisted laparoscopic right hemicolectomy on 06/21/2023

## 2023-07-21 NOTE — Progress Notes (Signed)
Initial Nutrition Assessment  DOCUMENTATION CODES:   Not applicable  INTERVENTION:   -MVI with minerals daily -Ensure Enlive po BID, each supplement provides 350 kcal and 20 grams of protein.  -Liberalize diet to regular for wider variety of meal selections  NUTRITION DIAGNOSIS:   Increased nutrient needs related to acute illness as evidenced by estimated needs.  GOAL:   Patient will meet greater than or equal to 90% of their needs  MONITOR:   PO intake, Supplement acceptance  REASON FOR ASSESSMENT:   Malnutrition Screening Tool    ASSESSMENT:   Pt with history of hypertension, hyperlipidemia, non-insulin-dependent diabetes mellitus, who presents for chief concerns of dizziness.  Pt admitted with stroke like symptoms.   Reviewed I/O's: +925 ml x 24 hours  UOP: 75 ml/hr  Per H&P, MRI of brain was negative.   Spoke with pt and daughter in law at bedside. Pt with a good appetite, consumed all of her breakfast this morning. Per family, pt lives alone, but has multiple family members how live closeby. Pt consumes 3 meals per day (Boost in the morning, Breakfast: cereal; Lunch: sandwich; Dinner: meat, starch, and vegetable). A family member always cooks a hot meal for her in the evening. Pt also enjoys going out with friends and family.   Per daughter in law, pt has had significant medical issues over the past month, including a rt colectomy on 06/21/23, but has recovered well from this. Pt recently had a physical and PCP was very pleased with her progress.    Reviewed wt hx; pt has experienced a 9.3% wt loss over the past 3 months, which is significant for time frame.   Discussed importance of good meal and supplement intake to promote healing. Pt amenable to Ensure, as she drinks these at home.   Pt eager to be discharged home.   Medications reviewed and include vitamin D3.   Lab Results  Component Value Date   HGBA1C 7.5 (H) 07/13/2023   PTA DM medications are 10 mg  glipizide daily.   Labs reviewed: CBGS: 148-177 (inpatient orders for glycemic control are 0-5 units insulin aspart daily at bedtime and 0-9 units insulin aspart TID with meals).    NUTRITION - FOCUSED PHYSICAL EXAM:  Flowsheet Row Most Recent Value  Orbital Region No depletion  Upper Arm Region Mild depletion  Thoracic and Lumbar Region No depletion  Buccal Region No depletion  Temple Region No depletion  Clavicle Bone Region No depletion  Clavicle and Acromion Bone Region No depletion  Scapular Bone Region No depletion  Dorsal Hand Mild depletion  Patellar Region Mild depletion  Anterior Thigh Region Mild depletion  Posterior Calf Region Mild depletion  Edema (RD Assessment) None  Hair Reviewed  Eyes Reviewed  Mouth Reviewed  Skin Reviewed  Nails Reviewed       Diet Order:   Diet Order             Diet regular Fluid consistency: Thin  Diet effective now                   EDUCATION NEEDS:   Education needs have been addressed  Skin:  Skin Assessment: Reviewed RN Assessment  Last BM:  07/19/23  Height:   Ht Readings from Last 1 Encounters:  07/20/23 5\' 2"  (1.575 m)    Weight:   Wt Readings from Last 1 Encounters:  07/20/23 54.9 kg    Ideal Body Weight:  50 kg  BMI:  Body mass index  is 22.13 kg/m.  Estimated Nutritional Needs:   Kcal:  1500-1700  Protein:  75-90 grams  Fluid:  > 1.5 L    Levada Schilling, RD, LDN, CDCES Registered Dietitian II Certified Diabetes Care and Education Specialist Please refer to [facility] for RD and/or RD on-call/weekend/after hours pager

## 2023-07-21 NOTE — Assessment & Plan Note (Signed)
Patient is on aspirin 81 mg daily, this has been resumed as this is patient's regular medication, atorvastatin 40 mg daily resumed

## 2023-07-21 NOTE — Assessment & Plan Note (Signed)
-   Atorvastatin 40 mg daily resumed 

## 2023-07-21 NOTE — Assessment & Plan Note (Signed)
Home amlodipine 10 mg daily resumed

## 2023-07-21 NOTE — Plan of Care (Signed)
  Problem: Education: Goal: Ability to describe self-care measures that may prevent or decrease complications (Diabetes Survival Skills Education) will improve Outcome: Not Progressing Goal: Individualized Educational Video(s) Outcome: Progressing   Problem: Coping: Goal: Ability to adjust to condition or change in health will improve Outcome: Not Progressing   Problem: Fluid Volume: Goal: Ability to maintain a balanced intake and output will improve Outcome: Progressing   Problem: Health Behavior/Discharge Planning: Goal: Ability to identify and utilize available resources and services will improve Outcome: Progressing Goal: Ability to manage health-related needs will improve Outcome: Progressing   Problem: Metabolic: Goal: Ability to maintain appropriate glucose levels will improve Outcome: Progressing   Problem: Nutritional: Goal: Maintenance of adequate nutrition will improve Outcome: Progressing Goal: Progress toward achieving an optimal weight will improve Outcome: Progressing   Problem: Skin Integrity: Goal: Risk for impaired skin integrity will decrease Outcome: Progressing   Problem: Tissue Perfusion: Goal: Adequacy of tissue perfusion will improve Outcome: Progressing   Problem: Education: Goal: Knowledge of General Education information will improve Description: Including pain rating scale, medication(s)/side effects and non-pharmacologic comfort measures Outcome: Progressing   Problem: Health Behavior/Discharge Planning: Goal: Ability to manage health-related needs will improve Outcome: Progressing   Problem: Clinical Measurements: Goal: Ability to maintain clinical measurements within normal limits will improve Outcome: Progressing Goal: Will remain free from infection Outcome: Progressing Goal: Diagnostic test results will improve Outcome: Progressing Goal: Respiratory complications will improve Outcome: Progressing Goal: Cardiovascular  complication will be avoided Outcome: Progressing   Problem: Activity: Goal: Risk for activity intolerance will decrease Outcome: Progressing   Problem: Nutrition: Goal: Adequate nutrition will be maintained Outcome: Progressing   Problem: Coping: Goal: Level of anxiety will decrease Outcome: Progressing   Problem: Pain Managment: Goal: General experience of comfort will improve Outcome: Progressing   Problem: Safety: Goal: Ability to remain free from injury will improve Outcome: Progressing   Problem: Skin Integrity: Goal: Risk for impaired skin integrity will decrease Outcome: Progressing

## 2023-07-21 NOTE — Assessment & Plan Note (Addendum)
Initial concern was for a stroke MRI of the brain without contrast has been negative Neurology was consulted by EDP who reviewed the CT of the head and neck and MRI and responded: Since MRI is negative, patient can be seen by outpatient ENT.  Differential diagnosis seems in favor of peripheral vestibulopathy.  If no neurological deficits other than subjective vertigo with or without nystagmus.  Unless there is a stroke or TIA, per literature aspirin is not indicated for asymptomatic intracranial atherosclerosis due to bleeding risk exceeding documented benefits regarding stroke risk reduction. Meclizine 12.5 mg 3 times daily as needed for dizziness, 2 days ordered Recommend outpatient evaluation by ENT if symptoms persist

## 2023-07-22 ENCOUNTER — Other Ambulatory Visit: Payer: Self-pay | Admitting: Family Medicine

## 2023-07-22 ENCOUNTER — Telehealth: Payer: Self-pay | Admitting: Family Medicine

## 2023-07-22 DIAGNOSIS — E1159 Type 2 diabetes mellitus with other circulatory complications: Secondary | ICD-10-CM

## 2023-07-22 DIAGNOSIS — E1169 Type 2 diabetes mellitus with other specified complication: Secondary | ICD-10-CM

## 2023-07-22 DIAGNOSIS — I5032 Chronic diastolic (congestive) heart failure: Secondary | ICD-10-CM

## 2023-07-22 DIAGNOSIS — E1165 Type 2 diabetes mellitus with hyperglycemia: Secondary | ICD-10-CM

## 2023-07-22 NOTE — Telephone Encounter (Signed)
Pt's daughter-in-law [name] is calling in because pt was seen in the hospital for dizziness and vertigo and was told to call PCP to have them set up an echocardiogram for pt. [name] would like to have that set up. Please follow up with [name] or pt.

## 2023-07-23 ENCOUNTER — Other Ambulatory Visit: Payer: Self-pay | Admitting: Family Medicine

## 2023-07-23 DIAGNOSIS — E1169 Type 2 diabetes mellitus with other specified complication: Secondary | ICD-10-CM

## 2023-07-23 DIAGNOSIS — I5032 Chronic diastolic (congestive) heart failure: Secondary | ICD-10-CM

## 2023-07-23 DIAGNOSIS — C189 Malignant neoplasm of colon, unspecified: Secondary | ICD-10-CM

## 2023-07-23 DIAGNOSIS — E1159 Type 2 diabetes mellitus with other circulatory complications: Secondary | ICD-10-CM

## 2023-07-23 DIAGNOSIS — R627 Adult failure to thrive: Secondary | ICD-10-CM

## 2023-07-23 DIAGNOSIS — M810 Age-related osteoporosis without current pathological fracture: Secondary | ICD-10-CM

## 2023-07-23 DIAGNOSIS — R54 Age-related physical debility: Secondary | ICD-10-CM

## 2023-07-23 DIAGNOSIS — E1165 Type 2 diabetes mellitus with hyperglycemia: Secondary | ICD-10-CM

## 2023-07-29 DIAGNOSIS — L570 Actinic keratosis: Secondary | ICD-10-CM | POA: Diagnosis not present

## 2023-07-29 DIAGNOSIS — D2261 Melanocytic nevi of right upper limb, including shoulder: Secondary | ICD-10-CM | POA: Diagnosis not present

## 2023-07-29 DIAGNOSIS — D225 Melanocytic nevi of trunk: Secondary | ICD-10-CM | POA: Diagnosis not present

## 2023-07-29 DIAGNOSIS — D2262 Melanocytic nevi of left upper limb, including shoulder: Secondary | ICD-10-CM | POA: Diagnosis not present

## 2023-07-29 DIAGNOSIS — D485 Neoplasm of uncertain behavior of skin: Secondary | ICD-10-CM | POA: Diagnosis not present

## 2023-07-29 DIAGNOSIS — C44329 Squamous cell carcinoma of skin of other parts of face: Secondary | ICD-10-CM | POA: Diagnosis not present

## 2023-07-29 DIAGNOSIS — Z85828 Personal history of other malignant neoplasm of skin: Secondary | ICD-10-CM | POA: Diagnosis not present

## 2023-08-03 DIAGNOSIS — H8111 Benign paroxysmal vertigo, right ear: Secondary | ICD-10-CM | POA: Diagnosis not present

## 2023-08-03 DIAGNOSIS — H6123 Impacted cerumen, bilateral: Secondary | ICD-10-CM | POA: Diagnosis not present

## 2023-08-03 DIAGNOSIS — R42 Dizziness and giddiness: Secondary | ICD-10-CM | POA: Diagnosis not present

## 2023-08-12 ENCOUNTER — Ambulatory Visit: Payer: Medicare Other

## 2023-08-12 ENCOUNTER — Ambulatory Visit
Admission: RE | Admit: 2023-08-12 | Discharge: 2023-08-12 | Disposition: A | Discharge status: Home / Self Care | Payer: Medicare Other | Source: Ambulatory Visit | Attending: Family Medicine | Admitting: Family Medicine

## 2023-08-12 DIAGNOSIS — I080 Rheumatic disorders of both mitral and aortic valves: Secondary | ICD-10-CM | POA: Insufficient documentation

## 2023-08-12 DIAGNOSIS — R0609 Other forms of dyspnea: Secondary | ICD-10-CM | POA: Diagnosis not present

## 2023-08-12 DIAGNOSIS — I152 Hypertension secondary to endocrine disorders: Secondary | ICD-10-CM | POA: Diagnosis not present

## 2023-08-12 DIAGNOSIS — E785 Hyperlipidemia, unspecified: Secondary | ICD-10-CM | POA: Insufficient documentation

## 2023-08-12 DIAGNOSIS — E1169 Type 2 diabetes mellitus with other specified complication: Secondary | ICD-10-CM | POA: Insufficient documentation

## 2023-08-12 DIAGNOSIS — R42 Dizziness and giddiness: Secondary | ICD-10-CM | POA: Diagnosis not present

## 2023-08-12 DIAGNOSIS — I5032 Chronic diastolic (congestive) heart failure: Secondary | ICD-10-CM | POA: Insufficient documentation

## 2023-08-12 DIAGNOSIS — I110 Hypertensive heart disease with heart failure: Secondary | ICD-10-CM | POA: Diagnosis not present

## 2023-08-12 DIAGNOSIS — R0600 Dyspnea, unspecified: Secondary | ICD-10-CM | POA: Insufficient documentation

## 2023-08-12 DIAGNOSIS — Z8673 Personal history of transient ischemic attack (TIA), and cerebral infarction without residual deficits: Secondary | ICD-10-CM | POA: Insufficient documentation

## 2023-08-12 DIAGNOSIS — E1159 Type 2 diabetes mellitus with other circulatory complications: Secondary | ICD-10-CM | POA: Diagnosis not present

## 2023-08-12 DIAGNOSIS — E1165 Type 2 diabetes mellitus with hyperglycemia: Secondary | ICD-10-CM

## 2023-08-12 LAB — ECHOCARDIOGRAM COMPLETE
AR max vel: 1.48 cm2
AV Area VTI: 1.52 cm2
AV Area mean vel: 1.44 cm2
AV Mean grad: 29.7 mmHg
AV Peak grad: 50.7 mmHg
Ao pk vel: 3.56 m/s
Area-P 1/2: 5.02 cm2
MV VTI: 2.47 cm2
S' Lateral: 2.9 cm

## 2023-08-12 NOTE — Progress Notes (Signed)
*  PRELIMINARY RESULTS* Echocardiogram 2D Echocardiogram has been performed.  Cristela Blue 08/12/2023, 12:29 PM

## 2023-08-16 DIAGNOSIS — H8111 Benign paroxysmal vertigo, right ear: Secondary | ICD-10-CM | POA: Diagnosis not present

## 2023-08-17 DIAGNOSIS — E119 Type 2 diabetes mellitus without complications: Secondary | ICD-10-CM | POA: Diagnosis not present

## 2023-08-24 ENCOUNTER — Encounter: Payer: Self-pay | Admitting: Family Medicine

## 2023-08-24 ENCOUNTER — Telehealth: Payer: Self-pay

## 2023-08-24 ENCOUNTER — Ambulatory Visit (INDEPENDENT_AMBULATORY_CARE_PROVIDER_SITE_OTHER): Payer: Medicare Other | Admitting: Family Medicine

## 2023-08-24 VITALS — BP 148/70 | HR 79 | Wt 127.2 lb

## 2023-08-24 DIAGNOSIS — E1159 Type 2 diabetes mellitus with other circulatory complications: Secondary | ICD-10-CM

## 2023-08-24 DIAGNOSIS — I152 Hypertension secondary to endocrine disorders: Secondary | ICD-10-CM

## 2023-08-24 DIAGNOSIS — E1169 Type 2 diabetes mellitus with other specified complication: Secondary | ICD-10-CM

## 2023-08-24 DIAGNOSIS — R54 Age-related physical debility: Secondary | ICD-10-CM

## 2023-08-24 DIAGNOSIS — E785 Hyperlipidemia, unspecified: Secondary | ICD-10-CM

## 2023-08-24 DIAGNOSIS — H8110 Benign paroxysmal vertigo, unspecified ear: Secondary | ICD-10-CM

## 2023-08-24 DIAGNOSIS — E1165 Type 2 diabetes mellitus with hyperglycemia: Secondary | ICD-10-CM | POA: Diagnosis not present

## 2023-08-24 MED ORDER — MECLIZINE HCL 12.5 MG PO TABS: 12.5000 mg | ORAL_TABLET | Freq: Two times a day (BID) | ORAL | 0 refills | Status: DC | PRN

## 2023-08-24 NOTE — Progress Notes (Signed)
Established patient visit   Patient: [name]   DOB: 07/07/1929   89 y.o. Female  MRN: [id number] Visit Date: 08/24/2023  Today's healthcare provider: Jacky Kindle, FNP  Introduced to nurse practitioner role and practice setting.  All questions answered.  Discussed provider/patient relationship and expectations.  Subjective    HPI HPI   Patient would like to discuss test that were ordered from the hospital. Patient was seen in ED on 07/20/23 Last edited by Acey Lav, CMA on 08/24/2023 11:40 AM.      The patient, with a history of diabetes and hypertension, presents with daily dizzy spells and fatigue. She reports that the dizziness is significant enough to affect her balance, causing her to hold onto walls when walking. The patient also experiences extreme fatigue, stating that she feels tired after performing simple tasks such as making her bed. She has been hospitalized three times this year and has recently had surgery. The patient also mentions a persistent itch on her scalp, which has been present for years. She is currently on meclizine for dizziness, glipizide for diabetes, and amlodipine and hydrochlorothiazide for hypertension.  Medications: Outpatient Medications Prior to Visit  Medication Sig   acetaminophen (TYLENOL) 325 MG tablet Take 2 tablets (650 mg total) by mouth every 6 (six) hours as needed for mild pain, moderate pain, fever or headache.   amLODipine (NORVASC) 10 MG tablet Take 1 tablet (10 mg total) by mouth daily.   aspirin EC 81 MG tablet Take 81 mg by mouth daily. Swallow whole.   atorvastatin (LIPITOR) 40 MG tablet Take 1 tablet (40 mg total) by mouth daily.   cholecalciferol (VITAMIN D) 1000 UNITS tablet Take 1,000 Units by mouth daily.    erythromycin ophthalmic ointment 1 Application at bedtime.   glipiZIDE (GLUCOTROL XL) 10 MG 24 hr tablet Take 1 tablet (10 mg total) by mouth every other day.   hydrochlorothiazide (HYDRODIURIL) 12.5 MG tablet  Take 1 tablet (12.5 mg total) by mouth every other day.   NEOMYCIN-POLYMYXIN-HC OP Apply 1 drop to eye as needed.   temazepam (RESTORIL) 15 MG capsule TAKE 1 CAPSULE BY MOUTH AT BEDTIME AS NEEDED FOR SLEEP   meclizine (ANTIVERT) 12.5 MG tablet Take 1 tablet (12.5 mg total) by mouth 3 (three) times daily as needed for dizziness. (Patient not taking: Reported on 08/24/2023)   No facility-administered medications prior to visit.    Review of Systems    Objective    BP (!) 121/105 (BP Location: Left Arm, Patient Position: Sitting, Cuff Size: Large)   Pulse 75   Wt 127 lb 3.2 oz (57.7 kg)   SpO2 100%   BMI 23.27 kg/m     Physical Exam Vitals and nursing note reviewed.  Constitutional:      General: She is not in acute distress.    Appearance: Normal appearance. She is normal weight. She is not ill-appearing, toxic-appearing or diaphoretic.  HENT:     Head: Normocephalic and atraumatic.  Cardiovascular:     Rate and Rhythm: Normal rate and regular rhythm.     Pulses: Normal pulses.     Heart sounds: Normal heart sounds. No murmur heard.    No friction rub. No gallop.  Pulmonary:     Effort: Pulmonary effort is normal. No respiratory distress.     Breath sounds: Normal breath sounds. No stridor. No wheezing, rhonchi or rales.  Chest:     Chest wall: No tenderness.  Musculoskeletal:  General: No swelling, tenderness, deformity or signs of injury. Normal range of motion.     Right lower leg: No edema.     Left lower leg: No edema.  Skin:    General: Skin is warm and dry.     Capillary Refill: Capillary refill takes less than 2 seconds.     Coloration: Skin is not jaundiced or pale.     Findings: No bruising, erythema, lesion or rash.  Neurological:     General: No focal deficit present.     Mental Status: She is alert and oriented to person, place, and time. Mental status is at baseline.     Cranial Nerves: No cranial nerve deficit.     Sensory: No sensory deficit.      Motor: No weakness.     Coordination: Coordination normal.  Psychiatric:        Mood and Affect: Mood normal.        Behavior: Behavior normal.        Thought Content: Thought content normal.        Judgment: Judgment normal.     No results found for any visits on 08/24/23.  Assessment & Plan    Dizziness Daily episodes of dizziness. Patient has been taking Meclizine as needed. Possible causes include age-related changes, glucose fluctuations, and anemia. -Continue Meclizine up to three times a day as needed for dizziness. -Consider referral to [facility] for benign positional postural vertigo therapy.  Fatigue Patient reports significant fatigue after minimal exertion. Possible causes include age-related changes and anemia. -Encourage pacing of activities and rest periods.  Hypertension Blood pressure was elevated during the visit. Patient is currently on Amlodipine (Norvasc) 10 mg every day and Hydrochlorothiazide 12.5 mg every day -Continue current antihypertensive medications. -Recheck blood pressure at next visit.  Diabetes Patient is currently on Glipizide every other day. Recent blood glucose levels have been within normal limits. -Discontinue Glipizide given risk for low blood sugars. -Consult with pharmacist for alternative blood sugar control medication that may be cost affordable.  Scalp Psoriasis Chronic scalp psoriasis causing significant itching. Previous treatments have been ineffective. -Consider home remedies such as apple cider vinegar or baby aspirin to alleviate symptoms.  Cardiac Function Recent echocardiogram showed normal squeeze function, mild enlargement of the left side, and mild regurgitation of the mitral valve. These findings are not likely the cause of the dizziness. -No further action required at this time.  Follow-up -Consider referral to physical therapy for vertigo management. -Consult with pharmacist for alternative blood sugar  control medication. -Consider home remedies for scalp psoriasis. -Recheck blood pressure at next visit.     Leilani Merl, FNP, have reviewed all documentation for this visit. The documentation on 08/24/23 for the exam, diagnosis, procedures, and orders are all accurate and complete.  Jacky Kindle, FNP  [facility] [phone] (phone) [phone] (fax)  [facility]

## 2023-08-24 NOTE — Progress Notes (Unsigned)
Care Guide Note  08/24/2023 Name: [name] MRN: [id number] DOB: 09/04/1929  Referred by: Jacky Kindle, FNP Reason for referral : Care Coordination (Outreach to schedule with Pharm d)   [name] is a 89 y.o. year old female who is a primary care patient of Jacky Kindle, FNP. [name] was referred to the pharmacist for assistance related to DM.    An unsuccessful telephone outreach was attempted today to contact the patient who was referred to the pharmacy team for assistance with medication assistance. Additional attempts will be made to contact the patient.   Penne Lash, RMA Care Guide [facility]  [place], [place] [address] Direct Dial: [phone] [email or web address]

## 2023-08-25 NOTE — Progress Notes (Signed)
Care Guide Note  08/25/2023 Name: [name] MRN: [id number] DOB: [birth year]  Referred by: Jacky Kindle, FNP Reason for referral : Care Coordination (Outreach to schedule with Pharm d)   [name] is a 89 y.o. year old female who is a primary care patient of Jacky Kindle, FNP. [name] was referred to the pharmacist for assistance related to DM.    A second unsuccessful telephone outreach was attempted today to contact the patient who was referred to the pharmacy team for assistance with medication management. Additional attempts will be made to contact the patient.  Penne Lash, RMA Care Guide [facility]  [place], [place] [address] Direct Dial: [phone] [email or web address]

## 2023-08-25 NOTE — Progress Notes (Signed)
Care Guide Note  08/25/2023 Name: [name] MRN: [id number] DOB: Jun 08, 1929  Referred by: Jacky Kindle, FNP Reason for referral : Care Coordination (Outreach to schedule with Pharm d)   [name] is a 89 y.o. year old female who is a primary care patient of Jacky Kindle, FNP. [name] was referred to the pharmacist for assistance related to DM.    Successful contact was made with the patient to discuss pharmacy services including being ready for the pharmacist to call at least 5 minutes before the scheduled appointment time, to have medication bottles and any blood sugar or blood pressure readings ready for review. The patient agreed to meet with the pharmacist via with the pharmacist via telephone visit on (date/time).  08/26/2023  Penne Lash, RMA Care Guide [facility]  [place], [place] [address] Direct Dial: [phone] [name]

## 2023-08-26 ENCOUNTER — Other Ambulatory Visit: Payer: Self-pay | Admitting: Family Medicine

## 2023-08-26 ENCOUNTER — Other Ambulatory Visit: Payer: Medicare Other | Admitting: Pharmacist

## 2023-08-26 DIAGNOSIS — E1165 Type 2 diabetes mellitus with hyperglycemia: Secondary | ICD-10-CM

## 2023-08-26 MED ORDER — GLIPIZIDE ER 5 MG PO TB24: 5.0000 mg | ORAL_TABLET | Freq: Every day | ORAL | 3 refills | Status: DC

## 2023-08-26 NOTE — Progress Notes (Addendum)
08/26/2023 Name: [name] MRN: [id number] DOB: November 26, 1928  Chief Complaint  Patient presents with   Diabetes   Hypertension    [name] is a 89 y.o. year old female who presented for a telephone visit.   They were referred to the pharmacist by their PCP for assistance in managing diabetes and hypertension.    Subjective:  Care Team: Primary Care Provider: Jacky Kindle, FNP ; Next Scheduled Visit: 07/13/24 Clinical Pharmacist: Marlowe Aschoff, PharmD  Medication Access/Adherence  Current Pharmacy:  [facility] [address], [place] - [address] [place] [address] Phone: [phone] Fax: [phone]   Patient reports affordability concerns with their medications: No  Patient reports access/transportation concerns to their pharmacy: No  Patient reports adherence concerns with their medications:  No     Diabetes:  Current medications: None (recently stopped Glipizide XL 10mg  every other day) Medications tried in the past: Metformin IR an XR (GI issues)  Does NOT check blood sugars at home  Patient reports hypoglycemic s/sx including dizziness (started meclizine), shakiness, sweating. Patient denies hyperglycemic symptoms including polyuria, polydipsia, polyphagia, nocturia, neuropathy, blurred vision.   Current medication access support: BCBS Medicare Advantage   Hypertension:  Current medications: Amlodipine 10mg , hydrochlorothiazide 12.5mg  every other day Medications previously tried: None  Patient does not have a validated, automated, upper arm home BP cuff  Patient reports hypotensive s/sx including dizziness, lightheadedness.  Patient denies hypertensive symptoms including headache, chest pain, shortness of breath   Objective:  Lab Results  Component Value Date   HGBA1C 7.5 (H) 07/13/2023    Lab Results  Component Value Date   CREATININE 0.78 07/21/2023   BUN 15 07/21/2023   NA 138 07/21/2023   K 3.8  07/21/2023   CL 108 07/21/2023   CO2 22 07/21/2023    Lab Results  Component Value Date   CHOL 149 07/13/2023   HDL 40 07/13/2023   LDLCALC 64 07/13/2023   TRIG 286 (H) 07/13/2023   CHOLHDL 3.7 07/13/2023    Medications Reviewed Today     Reviewed by Pollie Friar, RPH (Pharmacist) on 08/26/23 at 1120  Med List Status: <None>   Medication Order Taking? Sig Documenting Provider Last Dose Status Informant  acetaminophen (TYLENOL) 325 MG tablet [id number] Yes Take 2 tablets (650 mg total) by mouth every 6 (six) hours as needed for mild pain, moderate pain, fever or headache. Sung Amabile, DO Taking Active Child  amLODipine (NORVASC) 10 MG tablet [id number] Yes Take 1 tablet (10 mg total) by mouth daily. Jacky Kindle, FNP Taking Active Child  aspirin EC 81 MG tablet [id number] Yes Take 81 mg by mouth daily. Swallow whole. [provider] Taking Active Child  atorvastatin (LIPITOR) 40 MG tablet [id number] Yes Take 1 tablet (40 mg total) by mouth daily. Jacky Kindle, FNP Taking Active Child  cholecalciferol (VITAMIN D) 1000 UNITS tablet [id number] Yes Take 1,000 Units by mouth daily.  [provider] Taking Active Child           Med Note Nelia Shi   Tue Jun 22, 2023 10:37 AM)    erythromycin ophthalmic ointment [id number] Yes 1 Application at bedtime. [provider] Taking Active Child  hydrochlorothiazide (HYDRODIURIL) 12.5 MG tablet [id number] Yes Take 1 tablet (12.5 mg total) by mouth every other day. Jacky Kindle, FNP Taking Active Child  meclizine (ANTIVERT) 12.5 MG tablet [id number] Yes Take 1 tablet (12.5 mg total) by mouth 2 (two)  times daily as needed for dizziness. Jacky Kindle, FNP Taking Active   [facility] OP [id number] Yes Apply 1 drop to eye as needed. [provider] Taking Active Child  temazepam (RESTORIL) 15 MG capsule [id number] Yes TAKE 1 CAPSULE BY MOUTH AT BEDTIME AS NEEDED FOR SLEEP Jacky Kindle, FNP Taking  Active Child              Assessment/Plan:   Diabetes: - Currently controlled - Reviewed long term cardiovascular and renal outcomes of uncontrolled blood sugar - Reviewed goal A1c, goal fasting, and goal 2 hour post prandial glucose    Hypertension: - Currently uncontrolled - Reviewed long term cardiovascular and renal outcomes of uncontrolled blood pressure - Reviewed appropriate blood pressure monitoring technique and reviewed goal blood pressure. Recommended to check home blood pressure and heart rate daily or whenever dizziness occurs - Recommend to purchase a cuff from [employer]     **Follow Up Plan:**  - Follow-up scheduled for 2 weeks for BP review and dizziness assessment - MODIFY Glipizide XL to 5mg  daily (instead of 10mg  every other day) - Purchase BP cuff from [employer] to monitor readings and if cause of dizziness - Does NOT check BP or BG at home currently - For future, would be willing to try Farxiga 10mg  ($45/month at pharmacy) if needed and do PAP application if she qualifies- not seen again by PCP until 07/10/24  *Called patient at 4:50PM on 08/26/23 and explained changes with Glipizide XL (aware to STOP the 10mg  dose); will pick-up the 5mg  daily tomorrow along with a BP monitor  Marlowe Aschoff, PharmD [facility] Phone Number: [phone]

## 2023-08-26 NOTE — Progress Notes (Signed)
Already sent. EP

## 2023-08-31 DIAGNOSIS — D0439 Carcinoma in situ of skin of other parts of face: Secondary | ICD-10-CM | POA: Diagnosis not present

## 2023-08-31 DIAGNOSIS — C44329 Squamous cell carcinoma of skin of other parts of face: Secondary | ICD-10-CM | POA: Diagnosis not present

## 2023-09-07 DIAGNOSIS — H8112 Benign paroxysmal vertigo, left ear: Secondary | ICD-10-CM | POA: Diagnosis not present

## 2023-09-09 ENCOUNTER — Telehealth: Payer: Self-pay | Admitting: Pharmacist

## 2023-09-09 ENCOUNTER — Other Ambulatory Visit: Payer: Self-pay | Admitting: Pharmacist

## 2023-09-09 NOTE — Progress Notes (Signed)
09/09/2023  Patient ID: [name], female   DOB: [birth year], 89 y.o.   MRN: [id number]  Tried calling patient regarding BP follow-up for new readings regarding dizziness concerns discussed 2 weeks ago. Left voicemail to return call at her earliest convenience   Marlowe Aschoff, PharmD [facility] Phone Number: [phone]

## 2023-09-15 DIAGNOSIS — H8112 Benign paroxysmal vertigo, left ear: Secondary | ICD-10-CM | POA: Diagnosis not present

## 2023-09-23 ENCOUNTER — Other Ambulatory Visit: Payer: Medicare Other | Admitting: Pharmacist

## 2023-09-23 NOTE — Progress Notes (Signed)
09/23/2023 Name: [name] MRN: [id number] DOB: [birth year]  Chief Complaint  Patient presents with   Hypertension   Diabetes    [name] is a 89 y.o. year old female who presented for a telephone visit.   They were referred to the pharmacist by their PCP for assistance in managing diabetes and hypertension. Original concerns of dizziness.   Spoke with both the patient and her daughter [name] on the phone today.   Subjective:  Care Team: Primary Care Provider: Jacky Kindle, FNP ; Next Scheduled Visit: 07/13/24 Clinical Pharmacist: Marlowe Aschoff, PharmD  Medication Access/Adherence  Current Pharmacy:  [facility] [address], [place] - [address] [place] [address] Phone: [phone] Fax: [phone]   Patient reports affordability concerns with their medications: No  Patient reports access/transportation concerns to their pharmacy: No  Patient reports adherence concerns with their medications:  No     Diabetes:  Current medications: None (recently stopped Glipizide XL 10mg  every other day) Medications tried in the past: Metformin IR an XR (GI issues)  BG readings: 228 on Tuesday after dinner  Patient denies hypoglycemic s/sx including dizziness, shakiness, sweating. Patient denies hyperglycemic symptoms including polyuria, polydipsia, polyphagia, nocturia, neuropathy, blurred vision.   Current medication access support: BCBS Medicare Advantage   Hypertension:  Current medications: Amlodipine 10mg , hydrochlorothiazide 12.5mg  every other day Medications previously tried: None  Patient does have a validated, automated, upper arm home BP cuff BP readings: 125/73, 124/73, 132/75  Patient denies hypotensive s/sx including dizziness, lightheadedness.  Patient denies hypertensive symptoms including headache, chest pain, shortness of breath   Objective:  Lab Results  Component Value Date   HGBA1C 7.5 (H) 07/13/2023     Lab Results  Component Value Date   CREATININE 0.78 07/21/2023   BUN 15 07/21/2023   NA 138 07/21/2023   K 3.8 07/21/2023   CL 108 07/21/2023   CO2 22 07/21/2023    Lab Results  Component Value Date   CHOL 149 07/13/2023   HDL 40 07/13/2023   LDLCALC 64 07/13/2023   TRIG 286 (H) 07/13/2023   CHOLHDL 3.7 07/13/2023    Medications Reviewed Today     Reviewed by Pollie Friar, RPH (Pharmacist) on 09/23/23 at 1311  Med List Status: <None>   Medication Order Taking? Sig Documenting Provider Last Dose Status Informant  acetaminophen (TYLENOL) 325 MG tablet [id number] Yes Take 2 tablets (650 mg total) by mouth every 6 (six) hours as needed for mild pain, moderate pain, fever or headache. Sung Amabile, DO Taking Active Child  amLODipine (NORVASC) 10 MG tablet [id number] Yes Take 1 tablet (10 mg total) by mouth daily. Jacky Kindle, FNP Taking Active Child  aspirin EC 81 MG tablet [id number] Yes Take 81 mg by mouth daily. Swallow whole. [provider] Taking Active Child  atorvastatin (LIPITOR) 40 MG tablet [id number] Yes Take 1 tablet (40 mg total) by mouth daily. Jacky Kindle, FNP Taking Active Child  cholecalciferol (VITAMIN D) 1000 UNITS tablet [id number] Yes Take 1,000 Units by mouth daily.  [provider] Taking Active Child           Med Note Nelia Shi   Tue Jun 22, 2023 10:37 AM)    erythromycin ophthalmic ointment [id number] Yes 1 Application at bedtime. [provider] Taking Active Child  glipiZIDE (GLUCOTROL XL) 5 MG 24 hr tablet [id number] Yes Take 1 tablet (5 mg total) by mouth daily with breakfast. Merita Norton  T, FNP Taking Active   hydrochlorothiazide (HYDRODIURIL) 12.5 MG tablet [id number] Yes Take 1 tablet (12.5 mg total) by mouth every other day. Jacky Kindle, FNP Taking Active Child  meclizine (ANTIVERT) 12.5 MG tablet [id number] Yes Take 1 tablet (12.5 mg total) by mouth 2 (two) times daily as needed for dizziness. Jacky Kindle, FNP Taking Active   [facility] OP [id number] Yes Apply 1 drop to eye as needed. [provider] Taking Active Child  temazepam (RESTORIL) 15 MG capsule [id number] Yes TAKE 1 CAPSULE BY MOUTH AT BEDTIME AS NEEDED FOR SLEEP Jacky Kindle, FNP Taking Active Child              Assessment/Plan:   Diabetes: - Currently controlled - Reviewed long term cardiovascular and renal outcomes of uncontrolled blood sugar - Reviewed goal A1c, goal fasting, and goal 2 hour post prandial glucose - Advised to check BG in the morning prior to eating with a goal of <160 for her, due to age    Hypertension: - Currently controlled - Reviewed long term cardiovascular and renal outcomes of uncontrolled blood pressure - Reviewed appropriate blood pressure monitoring technique and reviewed goal blood pressure. Recommended to check home blood pressure and heart rate daily or whenever dizziness occurs    **Follow Up Plan:**  - No follow-up needed at this time as problems have resolved - No issues since doing the Glipizide XL 5mg  daily - Has been feeling great lately to the extent of driving herself to church last Sunday with daughter following behind her - Received PT adjustment with head tilts at [facility] and has had no more dizziness concerns since then  - For future, would be willing to try Farxiga 10mg  ($45/month at pharmacy) if needed and do PAP application if she qualifies- not seen again by PCP until 07/10/24  *Of note, patient owns both a BP and BG cuff now; BP updated from visit today  Marlowe Aschoff, PharmD [facility] Phone Number: [phone]

## 2023-09-29 DIAGNOSIS — K625 Hemorrhage of anus and rectum: Secondary | ICD-10-CM | POA: Diagnosis not present

## 2023-09-29 DIAGNOSIS — C3432 Malignant neoplasm of lower lobe, left bronchus or lung: Secondary | ICD-10-CM | POA: Diagnosis not present

## 2023-09-29 DIAGNOSIS — C189 Malignant neoplasm of colon, unspecified: Secondary | ICD-10-CM | POA: Diagnosis not present

## 2023-09-30 ENCOUNTER — Encounter: Payer: Self-pay | Admitting: Family Medicine

## 2023-10-02 ENCOUNTER — Inpatient Hospital Stay
Admission: EM | Admit: 2023-10-02 | Discharge: 2023-10-07 | DRG: 378 | Disposition: A | Discharge status: Home Health | Payer: Medicare Other | Attending: Internal Medicine | Admitting: Internal Medicine

## 2023-10-02 ENCOUNTER — Other Ambulatory Visit: Payer: Self-pay

## 2023-10-02 ENCOUNTER — Emergency Department: Payer: Medicare Other

## 2023-10-02 DIAGNOSIS — Z7984 Long term (current) use of oral hypoglycemic drugs: Secondary | ICD-10-CM

## 2023-10-02 DIAGNOSIS — I5032 Chronic diastolic (congestive) heart failure: Secondary | ICD-10-CM | POA: Diagnosis present

## 2023-10-02 DIAGNOSIS — Z8673 Personal history of transient ischemic attack (TIA), and cerebral infarction without residual deficits: Secondary | ICD-10-CM

## 2023-10-02 DIAGNOSIS — Z7982 Long term (current) use of aspirin: Secondary | ICD-10-CM

## 2023-10-02 DIAGNOSIS — K922 Gastrointestinal hemorrhage, unspecified: Secondary | ICD-10-CM | POA: Diagnosis not present

## 2023-10-02 DIAGNOSIS — E785 Hyperlipidemia, unspecified: Secondary | ICD-10-CM | POA: Diagnosis present

## 2023-10-02 DIAGNOSIS — C189 Malignant neoplasm of colon, unspecified: Secondary | ICD-10-CM | POA: Diagnosis present

## 2023-10-02 DIAGNOSIS — Z85820 Personal history of malignant melanoma of skin: Secondary | ICD-10-CM | POA: Diagnosis not present

## 2023-10-02 DIAGNOSIS — Z885 Allergy status to narcotic agent status: Secondary | ICD-10-CM

## 2023-10-02 DIAGNOSIS — D62 Acute posthemorrhagic anemia: Secondary | ICD-10-CM | POA: Diagnosis present

## 2023-10-02 DIAGNOSIS — Z85038 Personal history of other malignant neoplasm of large intestine: Secondary | ICD-10-CM

## 2023-10-02 DIAGNOSIS — I152 Hypertension secondary to endocrine disorders: Secondary | ICD-10-CM | POA: Diagnosis not present

## 2023-10-02 DIAGNOSIS — E1169 Type 2 diabetes mellitus with other specified complication: Secondary | ICD-10-CM | POA: Diagnosis not present

## 2023-10-02 DIAGNOSIS — Z9841 Cataract extraction status, right eye: Secondary | ICD-10-CM

## 2023-10-02 DIAGNOSIS — Z833 Family history of diabetes mellitus: Secondary | ICD-10-CM | POA: Diagnosis not present

## 2023-10-02 DIAGNOSIS — Z794 Long term (current) use of insulin: Secondary | ICD-10-CM

## 2023-10-02 DIAGNOSIS — I10 Essential (primary) hypertension: Secondary | ICD-10-CM | POA: Diagnosis not present

## 2023-10-02 DIAGNOSIS — K5731 Diverticulosis of large intestine without perforation or abscess with bleeding: Secondary | ICD-10-CM | POA: Diagnosis not present

## 2023-10-02 DIAGNOSIS — K229 Disease of esophagus, unspecified: Secondary | ICD-10-CM | POA: Diagnosis present

## 2023-10-02 DIAGNOSIS — Z87891 Personal history of nicotine dependence: Secondary | ICD-10-CM | POA: Diagnosis not present

## 2023-10-02 DIAGNOSIS — Z8249 Family history of ischemic heart disease and other diseases of the circulatory system: Secondary | ICD-10-CM

## 2023-10-02 DIAGNOSIS — Z980 Intestinal bypass and anastomosis status: Secondary | ICD-10-CM

## 2023-10-02 DIAGNOSIS — Z79899 Other long term (current) drug therapy: Secondary | ICD-10-CM | POA: Diagnosis not present

## 2023-10-02 DIAGNOSIS — I700 Atherosclerosis of aorta: Secondary | ICD-10-CM | POA: Diagnosis not present

## 2023-10-02 DIAGNOSIS — Z823 Family history of stroke: Secondary | ICD-10-CM

## 2023-10-02 DIAGNOSIS — B3781 Candidal esophagitis: Secondary | ICD-10-CM | POA: Diagnosis not present

## 2023-10-02 DIAGNOSIS — K2289 Other specified disease of esophagus: Secondary | ICD-10-CM | POA: Diagnosis not present

## 2023-10-02 DIAGNOSIS — E1159 Type 2 diabetes mellitus with other circulatory complications: Secondary | ICD-10-CM | POA: Diagnosis not present

## 2023-10-02 DIAGNOSIS — Z85118 Personal history of other malignant neoplasm of bronchus and lung: Secondary | ICD-10-CM

## 2023-10-02 DIAGNOSIS — R531 Weakness: Secondary | ICD-10-CM

## 2023-10-02 DIAGNOSIS — Z882 Allergy status to sulfonamides status: Secondary | ICD-10-CM

## 2023-10-02 DIAGNOSIS — I110 Hypertensive heart disease with heart failure: Secondary | ICD-10-CM | POA: Diagnosis not present

## 2023-10-02 DIAGNOSIS — Z860101 Personal history of adenomatous and serrated colon polyps: Secondary | ICD-10-CM

## 2023-10-02 DIAGNOSIS — K5900 Constipation, unspecified: Secondary | ICD-10-CM | POA: Diagnosis present

## 2023-10-02 DIAGNOSIS — E1165 Type 2 diabetes mellitus with hyperglycemia: Secondary | ICD-10-CM | POA: Diagnosis not present

## 2023-10-02 DIAGNOSIS — Z881 Allergy status to other antibiotic agents status: Secondary | ICD-10-CM

## 2023-10-02 DIAGNOSIS — Z888 Allergy status to other drugs, medicaments and biological substances status: Secondary | ICD-10-CM

## 2023-10-02 DIAGNOSIS — R911 Solitary pulmonary nodule: Secondary | ICD-10-CM | POA: Diagnosis present

## 2023-10-02 DIAGNOSIS — Z9049 Acquired absence of other specified parts of digestive tract: Secondary | ICD-10-CM

## 2023-10-02 DIAGNOSIS — Z85828 Personal history of other malignant neoplasm of skin: Secondary | ICD-10-CM

## 2023-10-02 DIAGNOSIS — K5730 Diverticulosis of large intestine without perforation or abscess without bleeding: Secondary | ICD-10-CM | POA: Diagnosis not present

## 2023-10-02 DIAGNOSIS — K921 Melena: Secondary | ICD-10-CM | POA: Diagnosis not present

## 2023-10-02 DIAGNOSIS — F5101 Primary insomnia: Secondary | ICD-10-CM | POA: Diagnosis not present

## 2023-10-02 DIAGNOSIS — Z808 Family history of malignant neoplasm of other organs or systems: Secondary | ICD-10-CM

## 2023-10-02 DIAGNOSIS — Z880 Allergy status to penicillin: Secondary | ICD-10-CM

## 2023-10-02 DIAGNOSIS — K5791 Diverticulosis of intestine, part unspecified, without perforation or abscess with bleeding: Secondary | ICD-10-CM | POA: Diagnosis not present

## 2023-10-02 LAB — COMPREHENSIVE METABOLIC PANEL
ALT: 14 U/L (ref 0–44)
AST: 17 U/L (ref 15–41)
Albumin: 3 g/dL — ABNORMAL LOW (ref 3.5–5.0)
Alkaline Phosphatase: 86 U/L (ref 38–126)
Anion gap: 10 (ref 5–15)
BUN: 20 mg/dL (ref 8–23)
CO2: 25 mmol/L (ref 22–32)
Calcium: 9 mg/dL (ref 8.9–10.3)
Chloride: 102 mmol/L (ref 98–111)
Creatinine, Ser: 0.73 mg/dL (ref 0.44–1.00)
GFR, Estimated: >60 mL/min (ref >60)
Glucose, Bld: 216 mg/dL — ABNORMAL HIGH (ref 70–99)
Potassium: 3.8 mmol/L (ref 3.5–5.1)
Sodium: 137 mmol/L (ref 135–145)
Total Bilirubin: 0.2 mg/dL (ref <1.2)
Total Protein: 6.9 g/dL (ref 6.5–8.1)

## 2023-10-02 LAB — CBC
HCT: 27.3 % — ABNORMAL LOW (ref 36.0–46.0)
Hemoglobin: 9.1 g/dL — ABNORMAL LOW (ref 12.0–15.0)
MCH: 29.3 pg (ref 26.0–34.0)
MCHC: 33.3 g/dL (ref 30.0–36.0)
MCV: 87.8 fL (ref 80.0–100.0)
Platelets: 313 10*3/uL (ref 150–400)
RBC: 3.11 MIL/uL — ABNORMAL LOW (ref 3.87–5.11)
RDW: 12.6 % (ref 11.5–15.5)
WBC: 8.9 10*3/uL (ref 4.0–10.5)
nRBC: 0 % (ref 0.0–0.2)

## 2023-10-02 MED ORDER — IOHEXOL 350 MG/ML SOLN
100.0000 mL | Freq: Once | INTRAVENOUS | Status: AC | PRN
  Administered 2023-10-02: 100 mL via INTRAVENOUS

## 2023-10-02 MED ORDER — SODIUM CHLORIDE 0.9 % IV BOLUS
500.0000 mL | Freq: Once | INTRAVENOUS | Status: AC
  Administered 2023-10-02: 500 mL via INTRAVENOUS

## 2023-10-02 MED ORDER — ONDANSETRON HCL 4 MG/2ML IJ SOLN: 4.0000 mg | Freq: Three times a day (TID) | INTRAMUSCULAR | Status: DC | PRN

## 2023-10-02 MED ORDER — HYDRALAZINE HCL 20 MG/ML IJ SOLN: 5.0000 mg | INTRAMUSCULAR | Status: DC | PRN

## 2023-10-02 MED ORDER — TEMAZEPAM 15 MG PO CAPS
15.0000 mg | ORAL_CAPSULE | Freq: Every evening | ORAL | Status: DC | PRN
  Administered 2023-10-03 – 2023-10-06 (×5): 15 mg via ORAL
  Filled 2023-10-02 (×6): qty 1

## 2023-10-02 MED ORDER — PANTOPRAZOLE SODIUM 40 MG IV SOLR
40.0000 mg | Freq: Two times a day (BID) | INTRAVENOUS | Status: DC
  Administered 2023-10-03 – 2023-10-04 (×4): 40 mg via INTRAVENOUS
  Filled 2023-10-02 (×4): qty 10

## 2023-10-02 MED ORDER — PANTOPRAZOLE SODIUM 40 MG IV SOLR
40.0000 mg | Freq: Once | INTRAVENOUS | Status: AC
  Administered 2023-10-02: 40 mg via INTRAVENOUS
  Filled 2023-10-02: qty 10

## 2023-10-02 MED ORDER — INSULIN ASPART 100 UNIT/ML IJ SOLN
0.0000 [IU] | Freq: Three times a day (TID) | INTRAMUSCULAR | Status: DC
  Administered 2023-10-03: 2 [IU] via SUBCUTANEOUS
  Administered 2023-10-03: 3 [IU] via SUBCUTANEOUS
  Administered 2023-10-04: 2 [IU] via SUBCUTANEOUS
  Administered 2023-10-04: 3 [IU] via SUBCUTANEOUS
  Administered 2023-10-05: 1 [IU] via SUBCUTANEOUS
  Administered 2023-10-05: 3 [IU] via SUBCUTANEOUS
  Administered 2023-10-06: 1 [IU] via SUBCUTANEOUS
  Administered 2023-10-06 (×2): 2 [IU] via SUBCUTANEOUS
  Administered 2023-10-07: 1 [IU] via SUBCUTANEOUS
  Filled 2023-10-02 (×10): qty 1

## 2023-10-02 MED ORDER — ACETAMINOPHEN 325 MG PO TABS: 650.0000 mg | ORAL_TABLET | Freq: Four times a day (QID) | ORAL | Status: DC | PRN

## 2023-10-02 MED ORDER — INSULIN ASPART 100 UNIT/ML IJ SOLN
0.0000 [IU] | Freq: Every day | INTRAMUSCULAR | Status: DC
  Administered 2023-10-03 – 2023-10-06 (×3): 2 [IU] via SUBCUTANEOUS
  Filled 2023-10-02 (×3): qty 1

## 2023-10-02 NOTE — H&P (Signed)
History and Physical    [name] YQM:[id number] DOB: [birth year] DOA: 10/02/2023  Referring MD/NP/PA:   PCP: Jacky Kindle, FNP   Patient coming from:  The patient is coming from home.     Chief Complaint: dark stool  HPI: [name] is a 89 y.o. female with medical history significant of colon cancer (s/p of partial colectomy), hypertension, hyperlipidemia, DM, diastolic CHF, stroke, GERD, bilateral carotid artery stenosis, diverticulosis, PSVT, IBS, who presents with dark stool.  Patient states that she has dark stool for almost a week, several episodes of dark stool bowel movement each day.  No nausea of vomiting.  No fever or chills.  Patient has intermittent mild abdominal cramping.  No chest pain, cough, shortness of breath.  No symptoms of UTI. Of note, in the last couple days she had very constipated feeling. She started taking laxative and colon prep, now has loose stool bowel movement.  Data reviewed independently and ED Course: pt was found to have hemoglobin 9.1 (11.2 on 9//24), GFR> CT, temperature normal, blood pressure 141/67, heart rate of 103, RR 18, oxygen saturation 94% on room air.  Patient is admitted to telemetry bed as inpatient.  Dr. Norma Fredrickson of GI is consulted.   EKG: I have personally reviewed.  Not done in ED, will get one.   ***   Review of Systems:   General: no fevers, chills, no body weight gain, has poor appetite, has fatigue HEENT: no blurry vision, hearing changes or sore throat Respiratory: no dyspnea, coughing, wheezing CV: no chest pain, no palpitations GI: no nausea, vomiting, abdominal pain, diarrhea, constipation GU: no dysuria, burning on urination, increased urinary frequency, hematuria  Ext: no leg edema Neuro: no unilateral weakness, numbness, or tingling, no vision change or hearing loss Skin: no rash, no skin tear. MSK: No muscle spasm, no deformity, no limitation of range of movement in spin Heme: No easy bruising.  Travel  history: No recent long distant travel.   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   a.) CT chest 03/09/2023: fusiform dilatation measuring 4.3 cm   Basal cell carcinoma (BCC) of left medial cheek    a.) s/p Mohs surgery 07/13/2022 at [facility]   Bilateral carotid artery disease [facility])    a.) doppler 04/03/2007: 50-75% RICA; b.) s/p LEFT CEA with CorMatrix patch angioplasty 01/18/2015; c.) doppler 11/02/2016: 40-59% RICA; d.) doppler 01/10/2018: 1-39% RICA   Diverticulosis 08/05/2015   Eczema of both upper extremities 06/10/2021   Heart failure with preserved left ventricular function (HFpEF) (HCC)    a.) TTE 05/17/2017: EF >55%, mild LVH,  mild LAE, mild panval regurg; b.) TTE 06/27/2018 : EF >55%, mod LVH, mild LAE, mild panval regurg'; G1dd; c.) TTE 12/07/2019: EF >55%, mild LVH, mild MAC, mild panval regurg; d.) TTE 01/27/2021: EF >55%, mod LVH, triv TR, mild AR/MR, G1DD   Heart murmur    History of right cataract extraction    Hyperlipidemia    Hypertension    Insomnia    a.) on BZO (temazepam) PRN   Irritable bowel syndrome with diarrhea 04/20/2017   LBP (low back pain) 08/05/2015   Melanoma (HCC)     a.) s/p Mohs surgery 02/06/2020 - LEFT medial brow --> V to Y advancement flap   Melena    Non-small cell lung cancer (NSCLC) (HCC) 03/09/2023   a.) CT chest 03/09/2023 --> superior segment LLL --> 2.1 x 1.7 x 2.1 cm mixed ground-glass and subpleural nodule extending to pleura with irreg margins --> concerning for primary bronchogenic carcinoma rather than metastatic disease; b.) 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 (60 Gy over 6 fractions)   OAB (overactive bladder)    PONV (postoperative nausea and vomiting)    a.) single episode following cataract surgery   PSVT (paroxysmal supraventricular tachycardia) (HCC) 02/22/2015   a.) holter 02/22/2015: 6 runs with longest lasting 10 beats   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   a.) MRI brain 04/02/2007 - subacute RIGHT pontine CVA   Stroke (HCC) 12/16/2014   a.) MRI brain 12/16/2014: punctate foci of restricted diffusion x 5 consistent with microembolic infarctions affecting the RIGHT lateral medulla, LEFT temporal lobe, 2 foci in the RIGHT thalamus, and LEFT inferior  frontal lobe/basal ganglia --> distribution would suggest cardiac or ascending aortic source   T2DM (type 2 diabetes mellitus) (HCC)    Tubular adenoma of colon     Past Surgical History:  Procedure Laterality Date   APPENDECTOMY     BREAST BIOPSY Right    neg   BREAST SURGERY     1980's   CAROTID ENDARTERECTOMY Left 01/18/2015   Procedure: CAROTID ENDARTERECTOMY; Location: [facility]; Surgeon: [name], MD   CATARACT EXTRACTION Right 2012   CHOLECYSTECTOMY  06/27/2012   COLONOSCOPY WITH PROPOFOL N/A 02/17/2023   Procedure: COLONOSCOPY WITH PROPOFOL;  Surgeon: Regis Bill, MD;  Location: [facility] ENDOSCOPY;  Service: Endoscopy;  Laterality: N/A;   DECOMPRESSIVE LUMBAR LAMINECTOMY LEVEL 1  1950   ESOPHAGOGASTRODUODENOSCOPY (EGD) WITH PROPOFOL N/A 02/16/2023   Procedure: ESOPHAGOGASTRODUODENOSCOPY (EGD) WITH PROPOFOL;  Surgeon: Regis Bill, MD;  Location:  [facility] ENDOSCOPY;  Service: Endoscopy;  Laterality: N/A;   MELANOMA EXCISION Left 02/06/2020   Procedure: MOHS SURGERY WITH V TO Y ADVANCEMENT FLAP (LEFT MEDIAL EYEBROW); Location: [facility]   MOHS SURGERY Left 07/13/2022   Procedure: MOHS SURGERY (BCC LEFT MEDIAL CHEEK); Location: [facility]    Social History:  reports that she quit smoking about 46 years ago. Her smoking use included cigarettes. She has never used smokeless tobacco. She reports that she does not drink alcohol and does not use drugs.  Family History:  Family History  Problem Relation Age of Onset   Ovarian cancer Mother    Heart disease Brother    Heart attack Brother    Gout Brother    Alzheimer's disease Brother    Dementia Brother    Heart attack Brother    Diabetes Daughter    Melanoma Daughter    Stroke Son    Breast cancer Neg Hx      Prior to Admission medications   Medication  Sig Start Date End Date Taking? Authorizing Provider  acetaminophen (TYLENOL) 325 MG tablet Take 2 tablets (650 mg total) by mouth every 6 (six) hours as needed for mild pain, moderate pain, fever or headache. 06/25/23   Tonna Boehringer, Isami, DO  amLODipine (NORVASC) 10 MG tablet Take 1 tablet (10 mg total) by mouth daily. 07/13/23   Jacky Kindle, FNP  aspirin EC 81 MG tablet Take 81 mg by mouth daily. Swallow whole.    [provider]  atorvastatin (LIPITOR) 40 MG tablet Take 1 tablet (40 mg total) by mouth daily. 07/13/23   Jacky Kindle, FNP  cholecalciferol (VITAMIN D) 1000 UNITS tablet Take 1,000 Units by mouth daily.     [provider]  erythromycin ophthalmic ointment 1 Application at bedtime.    [provider]  glipiZIDE (GLUCOTROL XL) 5 MG 24 hr tablet Take 1 tablet (5 mg total) by mouth daily with breakfast. 08/26/23   Jacky Kindle, FNP  hydrochlorothiazide (HYDRODIURIL) 12.5 MG tablet Take 1 tablet (12.5 mg total) by mouth every other day. 07/22/22   Jacky Kindle, FNP  meclizine (ANTIVERT) 12.5 MG tablet Take 1  tablet (12.5 mg total) by mouth 2 (two) times daily as needed for dizziness. 08/24/23   Jacky Kindle, FNP  [facility] OP Apply 1 drop to eye as needed.    [provider]  temazepam (RESTORIL) 15 MG capsule TAKE 1 CAPSULE BY MOUTH AT BEDTIME AS NEEDED FOR SLEEP 07/13/23   Jacky Kindle, FNP    Physical Exam: Vitals:   10/02/23 1632 10/02/23 2251  BP: 122/63 (!) 141/67  Pulse: (!) 103 (!) 103  Resp: 18 18  Temp: 98.1 F (36.7 C) 98.7 F (37.1 C)  TempSrc: Oral Oral  SpO2: 95% 94%  Weight: 58.5 kg   Height: 5\' 2"  (1.575 m)    General: Not in acute distress HEENT:       Eyes: PERRL, EOMI, no jaundice       ENT: No discharge from the ears and nose, no pharynx injection, no tonsillar enlargement.        Neck: No JVD, no bruit, no mass felt. Heme: No neck lymph node enlargement. Cardiac: S1/S2, RRR, No murmurs, No gallops or rubs. Respiratory: No rales, wheezing, rhonchi or rubs. GI: Soft, nondistended, nontender, no rebound pain, no organomegaly, BS present. GU: No hematuria Ext: No pitting leg edema bilaterally. 1+DP/PT pulse bilaterally. Musculoskeletal: No joint deformities, No joint redness or warmth, no limitation of ROM in spin. Skin: No rashes.  Neuro: Alert, oriented X3, cranial nerves II-XII grossly intact, moves all extremities normally. Muscle strength 5/5 in all extremities, sensation to light touch intact. Brachial reflex 2+ bilaterally. Knee reflex 1+ bilaterally. Negative Babinski's sign. Normal finger to nose test. Psych: Patient is not psychotic, no suicidal or hemocidal ideation.  Labs on Admission: I have personally reviewed following labs and imaging studies  CBC: Recent Labs  Lab 10/02/23 1633  WBC 8.9  HGB 9.1*  HCT 27.3*  MCV 87.8  PLT 313   Basic Metabolic Panel: Recent Labs  Lab 10/02/23 1633  NA 137  K 3.8  CL 102  CO2 25  GLUCOSE 216*  BUN 20  CREATININE 0.73  CALCIUM 9.0   GFR: Estimated Creatinine Clearance:  35.5 mL/min (by C-G formula based on SCr of 0.73 mg/dL). Liver Function Tests: Recent Labs  Lab 10/02/23 1633  AST 17  ALT 14  ALKPHOS 86  BILITOT 0.2  PROT 6.9  ALBUMIN 3.0*   No results for input(s): "LIPASE", "AMYLASE" in the last 168 hours. No results for input(s): "AMMONIA" in the last 168 hours. Coagulation Profile: No results for input(s): "INR", "PROTIME" in the last 168 hours. Cardiac Enzymes: No results for input(s): "CKTOTAL", "CKMB", "CKMBINDEX", "TROPONINI" in the last 168 hours. BNP (last 3 results) No results for input(s): "PROBNP" in the last 8760 hours. HbA1C: No results for input(s): "HGBA1C" in the last 72 hours. CBG: No results for input(s): "GLUCAP" in the last 168 hours. Lipid Profile: No results for input(s): "CHOL", "HDL", "LDLCALC", "TRIG", "CHOLHDL", "LDLDIRECT" in the last 72 hours. Thyroid Function Tests: No results for input(s): "TSH", "T4TOTAL", "FREET4", "T3FREE", "THYROIDAB" in the last 72 hours. Anemia Panel: No results for input(s): "VITAMINB12", "FOLATE", "FERRITIN", "TIBC", "IRON", "RETICCTPCT" in the last 72 hours. Urine analysis:    Component Value Date/Time   COLORURINE YELLOW 07/20/2023 1212   APPEARANCEUR CLEAR 07/20/2023 1212   APPEARANCEUR Clear 06/13/2021 1019   LABSPEC 1.025 07/20/2023 1212   LABSPEC 1.010 12/15/2014 1926   PHURINE 6.0 07/20/2023 1212   GLUCOSEU 500 (A) 07/20/2023 1212   GLUCOSEU NEGATIVE 12/15/2014 1926   HGBUR NEGATIVE 07/20/2023 1212   BILIRUBINUR NEGATIVE 07/20/2023 1212   BILIRUBINUR Negative 06/13/2021 1019   BILIRUBINUR NEGATIVE 12/15/2014 1926   KETONESUR NEGATIVE 07/20/2023 1212   PROTEINUR NEGATIVE 07/20/2023 1212   NITRITE NEGATIVE 07/20/2023 1212   LEUKOCYTESUR NEGATIVE 07/20/2023 1212   LEUKOCYTESUR NEGATIVE 12/15/2014 1926   Sepsis Labs: @LABRCNTIP (procalcitonin:4,lacticidven:4) )No results found for this or any previous visit (from the past 240 hour(s)).   Radiological Exams on  Admission: CT ANGIO GI BLEED  Result Date: 10/02/2023 CLINICAL DATA:  Lower GI bleed EXAM: CTA ABDOMEN AND PELVIS WITHOUT AND WITH CONTRAST TECHNIQUE: Multidetector CT imaging of the abdomen and pelvis was performed using the standard protocol during bolus administration of intravenous contrast. Multiplanar reconstructed images and MIPs were obtained and reviewed to evaluate the vascular anatomy. RADIATION DOSE REDUCTION: This exam was performed according to the departmental dose-optimization program which includes automated exposure control, adjustment of the mA and/or kV according to patient size and/or use of iterative reconstruction technique. CONTRAST:  OMNIPAQUE IOHEXOL 350 MG/ML SOLN COMPARISON:  None Available. FINDINGS: VASCULAR Aorta: Diffuse calcifications.  No aneurysm or dissection. Celiac: Patent.  No aneurysm or dissection or significant stenosis. SMA: Patent.  No aneurysm, dissection or significant stenosis. Renals: Single bilaterally, patent. IMA: Patent.  No aneurysm, dissection or significant stenosis. Inflow: Moderate atherosclerotic calcifications. No aneurysm, dissection or significant stenosis. Proximal Outflow: Atherosclerotic calcifications. No aneurysm, dissection or significant stenosis. Veins: No obvious venous abnormality within the limitations of this arterial phase study. Review of the MIP images confirms the above findings. NON-VASCULAR Lower chest: No acute abnormality. Hepatobiliary: No focal liver abnormality is seen. Status post cholecystectomy. No biliary dilatation. Pancreas: No focal abnormality or ductal dilatation. Spleen: No focal abnormality.  Normal size. Adrenals/Urinary Tract: No adrenal abnormality. No focal renal abnormality. No stones or hydronephrosis. Urinary bladder is unremarkable. Stomach/Bowel: Postoperative changes within the right colon. Sigmoid diverticulosis. No active diverticulitis. Stomach and small bowel decompressed, unremarkable. No visible  active contrast extravasation to localize GI bleed. Lymphatic: No adenopathy Reproductive: Uterus and adnexa unremarkable.  No mass. Other: No free fluid or free air. Musculoskeletal: No acute bony abnormality. IMPRESSION: VASCULAR No visible active contrast extravasation to localize GI bleed. Aortoiliac atherosclerosis. NON-VASCULAR Sigmoid diverticulosis.  No active diverticulitis. Electronically Signed   By: Charlett Nose M.D.   On: 10/02/2023 23:06  Assessment/Plan Principal Problem:   GI bleeding   Assessment and Plan: No notes have been filed under this hospital service. Service: Hospitalist      Principal Problem:   GI bleeding    DVT ppx: SQ Heparin         SQ Lovenox  Code Status: Full code   ***  Family Communication: not done, no family member is at bed side.              Yes, patient's    at bed side.       by phone  Disposition Plan:  Anticipate discharge back to previous environment  Consults called:    Admission status and Level of care: Telemetry Medical:    Med-surg bed for obs as inpt    progressive unit for obs   as inpt      SDU/inpation        {Inpatient:23812}  Dispo: The patient is from: {From:23814}              Anticipated d/c is to: {To:23815}              Anticipated d/c date is: {Days:23816}              Patient currently {Medically stable:23817}    Severity of Illness:  {Observation/Inpatient:21159}       Date of Service 10/02/2023    Lorretta Harp [facility]   If 7PM-7AM, please contact night-coverage [email or web address] 10/02/2023, 11:36 PM

## 2023-10-02 NOTE — ED Notes (Signed)
Patient returned from CT scan. Connected to monitor, VSS, CCM in use, call light within reach.

## 2023-10-02 NOTE — ED Triage Notes (Signed)
Pt to ED via POV from home. Pt had colon cancer resection surgery 8/5. Pt was reporting constipation and GI office told her to start a prep like she was doing a colonoscopy. Pt did prep on Thursday and Friday. Now pt reports black tarry stool x1 wk. Daughter-in-law also reports decreased appetite. Pt reports lower back pain and lower abd pain.

## 2023-10-02 NOTE — ED Provider Notes (Signed)
[facility] Provider Note    Event Date/Time   First MD Initiated Contact with Patient 10/02/23 2110     (approximate)  History   Melena (/)  HPI  [name] is a 89 y.o. female history of previous colon resection in August.  She has been doing relatively well she reports decreased energy fatigue since the surgery but has recovered fairly well.  The last couple days she had a very constipated feeling.  She started taking laxative and a colon prep.  However over the last week she has noticed that she will have a very loose black stool.  She saw the GI clinic but could not be seen by the physician on Wednesday and had labs drawn at that time.  As she reports that the clinic and her and her family been very concerned that she is having some bleeding or blood in her stool.  No nausea or vomiting.  She reports there is some crampy discomfort.  Lots of watery very dark black stool.  Does not take any blood thinner does take aspirin.  Follows closely with Dr. Mia Creek     Physical Exam   Triage Vital Signs: ED Triage Vitals  Encounter Vitals Group     BP 10/02/23 1632 122/63     Systolic BP Percentile --      Diastolic BP Percentile --      Pulse Rate 10/02/23 1632 (!) 103     Resp 10/02/23 1632 18     Temp 10/02/23 1632 98.1 F (36.7 C)     Temp Source 10/02/23 1632 Oral     SpO2 10/02/23 1632 95 %     Weight 10/02/23 1632 129 lb (58.5 kg)     Height 10/02/23 1632 5\' 2"  (1.575 m)     Head Circumference --      Peak Flow --      Pain Score 10/02/23 1638 8     Pain Loc --      Pain Education --      Exclude from Growth Chart --     Most recent vital signs: Vitals:   10/02/23 1632 10/02/23 2251  BP: 122/63 (!) 141/67  Pulse: (!) 103 (!) 103  Resp: 18 18  Temp: 98.1 F (36.7 C) 98.7 F (37.1 C)  SpO2: 95% 94%     General: Awake, no distress.  CV:  Good peripheral perfusion.  Resp:  Normal effort.  Abd:  No distention.  Soft, reports  slight tenderness in the left lower quadrant no rebound or guarding.  Old surgical incisions appear well-healed.  No obvious hernias Other:  Warm well-perfused no noted cyanosis or pallor.  She is in no distress appears nontoxic.   ED Results / Procedures / Treatments   Labs (all labs ordered are listed, but only abnormal results are displayed) Labs Reviewed  COMPREHENSIVE METABOLIC PANEL - Abnormal; Notable for the following components:      Result Value   Glucose, Bld 216 (*)    Albumin 3.0 (*)    All other components within normal limits  CBC - Abnormal; Notable for the following components:   RBC 3.11 (*)    Hemoglobin 9.1 (*)    HCT 27.3 (*)    All other components within normal limits  POC OCCULT BLOOD, ED  TYPE AND SCREEN   Hemoglobin noted to have reduction from previous check, acute drop  RADIOLOGY CT of the abdomen GI bleed study interpreted as grossly negative  by myself   CT ANGIO GI BLEED  Result Date: 10/02/2023 CLINICAL DATA:  Lower GI bleed EXAM: CTA ABDOMEN AND PELVIS WITHOUT AND WITH CONTRAST TECHNIQUE: Multidetector CT imaging of the abdomen and pelvis was performed using the standard protocol during bolus administration of intravenous contrast. Multiplanar reconstructed images and MIPs were obtained and reviewed to evaluate the vascular anatomy. RADIATION DOSE REDUCTION: This exam was performed according to the departmental dose-optimization program which includes automated exposure control, adjustment of the mA and/or kV according to patient size and/or use of iterative reconstruction technique. CONTRAST:  OMNIPAQUE IOHEXOL 350 MG/ML SOLN COMPARISON:  None Available. FINDINGS: VASCULAR Aorta: Diffuse calcifications.  No aneurysm or dissection. Celiac: Patent.  No aneurysm or dissection or significant stenosis. SMA: Patent.  No aneurysm, dissection or significant stenosis. Renals: Single bilaterally, patent. IMA: Patent.  No aneurysm, dissection or significant  stenosis. Inflow: Moderate atherosclerotic calcifications. No aneurysm, dissection or significant stenosis. Proximal Outflow: Atherosclerotic calcifications. No aneurysm, dissection or significant stenosis. Veins: No obvious venous abnormality within the limitations of this arterial phase study. Review of the MIP images confirms the above findings. NON-VASCULAR Lower chest: No acute abnormality. Hepatobiliary: No focal liver abnormality is seen. Status post cholecystectomy. No biliary dilatation. Pancreas: No focal abnormality or ductal dilatation. Spleen: No focal abnormality.  Normal size. Adrenals/Urinary Tract: No adrenal abnormality. No focal renal abnormality. No stones or hydronephrosis. Urinary bladder is unremarkable. Stomach/Bowel: Postoperative changes within the right colon. Sigmoid diverticulosis. No active diverticulitis. Stomach and small bowel decompressed, unremarkable. No visible active contrast extravasation to localize GI bleed. Lymphatic: No adenopathy Reproductive: Uterus and adnexa unremarkable.  No mass. Other: No free fluid or free air. Musculoskeletal: No acute bony abnormality. IMPRESSION: VASCULAR No visible active contrast extravasation to localize GI bleed. Aortoiliac atherosclerosis. NON-VASCULAR Sigmoid diverticulosis.  No active diverticulitis. Electronically Signed   By: Charlett Nose M.D.   On: 10/02/2023 23:06      PROCEDURES:  Critical Care performed: Yes, see critical care procedure note(s)  CRITICAL CARE Performed by: Sharyn Creamer   Total critical care time: 25 minutes  Critical care time was exclusive of separately billable procedures and treating other patients.  Critical care was necessary to treat or prevent imminent or life-threatening deterioration.  Critical care was time spent personally by me on the following activities: development of treatment plan with patient and/or surrogate as well as nursing, discussions with consultants, evaluation of patient's  response to treatment, examination of patient, obtaining history from patient or surrogate, ordering and performing treatments and interventions, ordering and review of laboratory studies, ordering and review of radiographic studies, pulse oximetry and re-evaluation of patient's condition.  Procedures   MEDICATIONS ORDERED IN ED: Medications  sodium chloride 0.9 % bolus 500 mL (has no administration in time range)  pantoprazole (PROTONIX) injection 40 mg (40 mg Intravenous Given 10/02/23 2252)  iohexol (OMNIPAQUE) 350 MG/ML injection 100 mL (100 mLs Intravenous Contrast Given 10/02/23 2241)     IMPRESSION / MDM / ASSESSMENT AND PLAN / ED COURSE  I reviewed the triage vital signs and the nursing notes.                              Patient presents with acute on chronic anemia, history of dark stool concerning for melena.  She is not anticoagulated.  She takes aspirin only no NSAID.  She had recent GI surgery  Reassuring exam overall but concern for potential active GI  bleeding.  She has just been performing a colon prep today.  She has already seen/notified the GI team about this and had labs drawn 2 days ago which showed a acute drop in hemoglobin.  She is hemodynamically stable at this time but concerning findings of acute on chronic anemia.  Differential includes GI bleeding possibly upper or lower source, uncontrollable diarrhea, constipation seeding, mass lesion, diverticular, AV malformation, ulcer, etc.  She does not have any fever, no clinical symptoms of be highly suggestive of acute infection or obvious frank vascular ischemia or ischemic colitis.   Case discussed with Dr. Norma Fredrickson, who advises patient should remain n.p.o. and he will see provide consultation in the morning for further, at this time agreeable with PPI.  Monitor serial hemoglobins, transfuse if needed  Patient's presentation is most consistent with acute presentation with potential threat to life or bodily  function.    Patient and family agreeable understand plan for admission GI consultation  Consulted with patient accepted to hospital service by Dr. Clyde Lundborg      FINAL CLINICAL IMPRESSION(S) / ED DIAGNOSES   Final diagnoses:  Melena  Gastrointestinal hemorrhage, unspecified gastrointestinal hemorrhage type     Rx / DC Orders   ED Discharge Orders     None        Note:  This document was prepared using Dragon voice recognition software and may include unintentional dictation errors.   Sharyn Creamer, MD 10/02/23 [phone]

## 2023-10-03 ENCOUNTER — Encounter
Admission: EM | Disposition: A | Discharge status: Home Health | Payer: Self-pay | Source: Home / Self Care | Attending: Internal Medicine

## 2023-10-03 ENCOUNTER — Encounter: Payer: Self-pay | Admitting: Internal Medicine

## 2023-10-03 ENCOUNTER — Inpatient Hospital Stay: Payer: Medicare Other | Admitting: Registered Nurse

## 2023-10-03 DIAGNOSIS — K921 Melena: Secondary | ICD-10-CM | POA: Diagnosis not present

## 2023-10-03 DIAGNOSIS — D62 Acute posthemorrhagic anemia: Secondary | ICD-10-CM | POA: Diagnosis not present

## 2023-10-03 DIAGNOSIS — R531 Weakness: Secondary | ICD-10-CM | POA: Diagnosis not present

## 2023-10-03 HISTORY — PX: ESOPHAGOGASTRODUODENOSCOPY: SHX5428

## 2023-10-03 HISTORY — PX: ESOPHAGEAL BRUSHING: SHX6842

## 2023-10-03 LAB — CBG MONITORING, ED
Glucose-Capillary: 162 mg/dL — ABNORMAL HIGH (ref 70–99)
Glucose-Capillary: 176 mg/dL — ABNORMAL HIGH (ref 70–99)

## 2023-10-03 LAB — PROTIME-INR
INR: 1.3 — ABNORMAL HIGH (ref 0.8–1.2)
Prothrombin Time: 16.3 s — ABNORMAL HIGH (ref 11.4–15.2)

## 2023-10-03 LAB — CBC
HCT: 20.5 % — ABNORMAL LOW (ref 36.0–46.0)
HCT: 23.2 % — ABNORMAL LOW (ref 36.0–46.0)
HCT: 24.3 % — ABNORMAL LOW (ref 36.0–46.0)
HCT: 24.3 % — ABNORMAL LOW (ref 36.0–46.0)
HCT: 25.8 % — ABNORMAL LOW (ref 36.0–46.0)
Hemoglobin: 6.5 g/dL — ABNORMAL LOW (ref 12.0–15.0)
Hemoglobin: 7.5 g/dL — ABNORMAL LOW (ref 12.0–15.0)
Hemoglobin: 7.9 g/dL — ABNORMAL LOW (ref 12.0–15.0)
Hemoglobin: 7.9 g/dL — ABNORMAL LOW (ref 12.0–15.0)
Hemoglobin: 8.3 g/dL — ABNORMAL LOW (ref 12.0–15.0)
MCH: 28.2 pg (ref 26.0–34.0)
MCH: 28.5 pg (ref 26.0–34.0)
MCH: 29.2 pg (ref 26.0–34.0)
MCH: 29.3 pg (ref 26.0–34.0)
MCH: 29.5 pg (ref 26.0–34.0)
MCHC: 31.7 g/dL (ref 30.0–36.0)
MCHC: 32.2 g/dL (ref 30.0–36.0)
MCHC: 32.3 g/dL (ref 30.0–36.0)
MCHC: 32.5 g/dL (ref 30.0–36.0)
MCHC: 32.5 g/dL (ref 30.0–36.0)
MCV: 87.8 fL (ref 80.0–100.0)
MCV: 89.7 fL (ref 80.0–100.0)
MCV: 89.9 fL (ref 80.0–100.0)
MCV: 90 fL (ref 80.0–100.0)
MCV: 91.3 fL (ref 80.0–100.0)
Platelets: 235 10*3/uL (ref 150–400)
Platelets: 242 10*3/uL (ref 150–400)
Platelets: 268 10*3/uL (ref 150–400)
Platelets: 275 10*3/uL (ref 150–400)
Platelets: 308 10*3/uL (ref 150–400)
RBC: 2.28 MIL/uL — ABNORMAL LOW (ref 3.87–5.11)
RBC: 2.54 MIL/uL — ABNORMAL LOW (ref 3.87–5.11)
RBC: 2.7 MIL/uL — ABNORMAL LOW (ref 3.87–5.11)
RBC: 2.71 MIL/uL — ABNORMAL LOW (ref 3.87–5.11)
RBC: 2.94 MIL/uL — ABNORMAL LOW (ref 3.87–5.11)
RDW: 12.6 % (ref 11.5–15.5)
RDW: 12.6 % (ref 11.5–15.5)
RDW: 12.6 % (ref 11.5–15.5)
RDW: 12.7 % (ref 11.5–15.5)
RDW: 12.9 % (ref 11.5–15.5)
WBC: 7.1 10*3/uL (ref 4.0–10.5)
WBC: 8.1 10*3/uL (ref 4.0–10.5)
WBC: 9.2 10*3/uL (ref 4.0–10.5)
WBC: 9.6 10*3/uL (ref 4.0–10.5)
WBC: 9.6 10*3/uL (ref 4.0–10.5)
nRBC: 0 % (ref 0.0–0.2)
nRBC: 0 % (ref 0.0–0.2)
nRBC: 0 % (ref 0.0–0.2)
nRBC: 0 % (ref 0.0–0.2)
nRBC: 0 % (ref 0.0–0.2)

## 2023-10-03 LAB — BASIC METABOLIC PANEL
Anion gap: 9 (ref 5–15)
BUN: 20 mg/dL (ref 8–23)
CO2: 24 mmol/L (ref 22–32)
Calcium: 8.4 mg/dL — ABNORMAL LOW (ref 8.9–10.3)
Chloride: 103 mmol/L (ref 98–111)
Creatinine, Ser: 0.69 mg/dL (ref 0.44–1.00)
GFR, Estimated: >60 mL/min (ref >60)
Glucose, Bld: 185 mg/dL — ABNORMAL HIGH (ref 70–99)
Potassium: 3.6 mmol/L (ref 3.5–5.1)
Sodium: 136 mmol/L (ref 135–145)

## 2023-10-03 LAB — GLUCOSE, CAPILLARY
Glucose-Capillary: 170 mg/dL — ABNORMAL HIGH (ref 70–99)
Glucose-Capillary: 216 mg/dL — ABNORMAL HIGH (ref 70–99)
Glucose-Capillary: 203 mg/dL — ABNORMAL HIGH (ref 70–99)

## 2023-10-03 LAB — RETICULOCYTES
Immature Retic Fract: 31.2 % — ABNORMAL HIGH (ref 2.3–15.9)
RBC.: 2.84 MIL/uL — ABNORMAL LOW (ref 3.87–5.11)
Retic Count, Absolute: 90 10*3/uL (ref 19.0–186.0)
Retic Ct Pct: 3.2 % — ABNORMAL HIGH (ref 0.4–3.1)

## 2023-10-03 LAB — IRON AND TIBC
Iron: 45 ug/dL (ref 28–170)
Saturation Ratios: 16 % (ref 10.4–31.8)
TIBC: 287 ug/dL (ref 250–450)
UIBC: 242 ug/dL

## 2023-10-03 LAB — FERRITIN: Ferritin: 35 ng/mL (ref 11–307)

## 2023-10-03 LAB — KOH PREP: Special Requests: NORMAL

## 2023-10-03 LAB — APTT: aPTT: 27 s (ref 24–36)

## 2023-10-03 LAB — BRAIN NATRIURETIC PEPTIDE: B Natriuretic Peptide: 243.1 pg/mL — ABNORMAL HIGH (ref 0.0–100.0)

## 2023-10-03 LAB — FOLATE: Folate: 16.6 ng/mL (ref >5.9)

## 2023-10-03 LAB — VITAMIN B12: Vitamin B-12: 215 pg/mL (ref 180–914)

## 2023-10-03 SURGERY — EGD (ESOPHAGOGASTRODUODENOSCOPY)
Anesthesia: General

## 2023-10-03 MED ORDER — PROPOFOL 10 MG/ML IV BOLUS
INTRAVENOUS | Status: DC | PRN
  Administered 2023-10-03: 50 mg via INTRAVENOUS
  Administered 2023-10-03 (×3): 20 mg via INTRAVENOUS

## 2023-10-03 MED ORDER — SODIUM CHLORIDE 0.9 % IV SOLN: INTRAVENOUS | Status: DC

## 2023-10-03 MED ORDER — ORAL CARE MOUTH RINSE: 15.0000 mL | OROMUCOSAL | Status: DC | PRN

## 2023-10-03 MED ORDER — PHENYLEPHRINE HCL (PRESSORS) 10 MG/ML IV SOLN
INTRAVENOUS | Status: DC | PRN
  Administered 2023-10-03: 80 ug via INTRAVENOUS

## 2023-10-03 MED ORDER — ATORVASTATIN CALCIUM 20 MG PO TABS
40.0000 mg | ORAL_TABLET | Freq: Every day | ORAL | Status: DC
  Administered 2023-10-03 – 2023-10-07 (×5): 40 mg via ORAL
  Filled 2023-10-03 (×5): qty 2

## 2023-10-03 MED ORDER — MECLIZINE HCL 25 MG PO TABS: 12.5000 mg | ORAL_TABLET | Freq: Two times a day (BID) | ORAL | Status: DC | PRN

## 2023-10-03 MED ORDER — ERYTHROMYCIN 5 MG/GM OP OINT
1.0000 | TOPICAL_OINTMENT | Freq: Every day | OPHTHALMIC | Status: DC
  Administered 2023-10-03 – 2023-10-06 (×4): 1 via OPHTHALMIC
  Filled 2023-10-03 (×2): qty 1

## 2023-10-03 MED ORDER — BOOST / RESOURCE BREEZE PO LIQD CUSTOM
1.0000 | Freq: Three times a day (TID) | ORAL | Status: DC
  Administered 2023-10-03 – 2023-10-06 (×4): 1 via ORAL

## 2023-10-03 NOTE — Plan of Care (Signed)

## 2023-10-03 NOTE — H&P (Incomplete)
History and Physical    [name] YQM:[id number] DOB: Oct 07, 1929 DOA: 10/02/2023  Referring MD/NP/PA:   PCP: Jacky Kindle, FNP   Patient coming from:  The patient is coming from home.     Chief Complaint: dark stool  HPI: [name] is a 89 y.o. female with medical history significant of colon cancer (s/p of partial colectomy), hypertension, hyperlipidemia, DM, diastolic CHF, stroke, GERD, bilateral carotid artery stenosis, diverticulosis, PSVT, IBS, who presents with dark stool.  Patient states that she has dark stool for almost a week, several episodes of dark stool bowel movement each day.  No nausea of vomiting.  No fever or chills.  Patient has intermittent mild abdominal cramping.  No chest pain, cough, shortness of breath.  No symptoms of UTI. Of note, in the last couple days she had very constipated feeling. She started taking laxative and colon prep, now has loose stool bowel movement.  Data reviewed independently and ED Course: pt was found to have hemoglobin 9.1 (11.2 on 9//24), GFR> CT, temperature normal, blood pressure 141/67, heart rate of 103, RR 18, oxygen saturation 94% on room air.  Patient is admitted to telemetry bed as inpatient.  Dr. Norma Fredrickson of GI is consulted.   EKG:   Not done in ED, will get one.      Review of Systems:   General: no fevers, chills, no body weight gain, has poor appetite, has fatigue HEENT: no blurry vision, hearing changes or sore throat Respiratory: no dyspnea, coughing, wheezing CV: no chest pain, no palpitations GI: no nausea, vomiting, abdominal pain, diarrhea, constipation GU: no dysuria, burning on urination, increased urinary frequency, hematuria  Ext: no leg edema Neuro: no unilateral weakness, numbness, or tingling, no vision change or hearing loss Skin: no rash, no skin tear. MSK: No muscle spasm, no deformity, no limitation of range of movement in spin Heme: No easy bruising.  Travel history: No recent long distant  travel.   Allergy:  Allergies  Allergen Reactions  . Cephalosporins     "upset stomach"  . Codeine   . Macrolides And Ketolides     "upset stomach" Other reaction(s): Unknown "upset stomach"  . Nitrofurantoin     Other reaction(s): Other (See Comments)  . Penicillins   . Tramadol Nausea Only  . Doxycycline Rash  . Sulfa Antibiotics Rash    Past Medical History:  Diagnosis Date  . Acid reflux 08/05/2015  . 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 Anemia   . Aortic atherosclerosis (HCC)   . Aortic stenosis    a.) TTE 05/17/2017: mild-mod AS (MPG 21.3); b.) TTE 06/17/2018: mild-mod AS (MPG 25.6/ AVA = 1.3 cm^2); c.) TTE 12/11/2019: mod AS (MPG 19.3/ AVA = 0.90 cm^2); d.) TTE 01/27/2021: mod AS (MPG 21.9/ AVA = 0.99 cm^2)  . 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   a.) CT chest 03/09/2023: fusiform dilatation measuring 4.3 cm  . Basal cell carcinoma (BCC) of left medial cheek    a.) s/p Mohs surgery 07/13/2022 at [facility]  . Bilateral carotid artery disease (HCC)    a.) doppler 04/03/2007: 50-75% RICA; b.) s/p LEFT CEA with CorMatrix patch angioplasty 01/18/2015; c.) doppler 11/02/2016: 40-59% RICA; d.) doppler 01/10/2018: 1-39% RICA  . Diverticulosis 08/05/2015  . Eczema of both upper extremities 06/10/2021  . Heart failure with preserved left ventricular function (HFpEF) (HCC)    a.) TTE 05/17/2017: EF >55%, mild LVH, mild LAE, mild  panval regurg; b.) TTE 06/27/2018 : EF >55%, mod LVH, mild LAE, mild panval regurg'; G1dd; c.) TTE 12/07/2019: EF >55%, mild LVH, mild MAC, mild panval regurg; d.) TTE 01/27/2021: EF >55%, mod LVH, triv TR, mild AR/MR, G1DD  . Heart murmur   . History of right cataract extraction   . Hyperlipidemia   . Hypertension   . Insomnia    a.) on BZO (temazepam) PRN  . Irritable bowel syndrome with diarrhea 04/20/2017  . LBP (low back pain) 08/05/2015  . Melanoma (HCC)    a.)  s/p Mohs surgery 02/06/2020 - LEFT medial brow --> V to Y advancement flap  . Melena   . Non-small cell lung cancer (NSCLC) (HCC) 03/09/2023   a.) CT chest 03/09/2023 --> superior segment LLL --> 2.1 x 1.7 x 2.1 cm mixed ground-glass and subpleural nodule extending to pleura with irreg margins --> concerning for primary bronchogenic carcinoma rather than metastatic disease; b.) 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 (60 Gy over 6 fractions)  . OAB (overactive bladder)   . PONV (postoperative nausea and vomiting)    a.) single episode following cataract surgery  . PSVT (paroxysmal supraventricular tachycardia) (HCC) 02/22/2015   a.) holter 02/22/2015: 6 runs with longest lasting 10 beats  . 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   a.) MRI brain 04/02/2007 - subacute RIGHT pontine CVA  . Stroke (HCC) 12/16/2014   a.) MRI brain 12/16/2014: punctate foci of restricted diffusion x 5 consistent with microembolic infarctions affecting the RIGHT lateral medulla, LEFT temporal lobe, 2 foci in the RIGHT thalamus, and LEFT inferior  frontal lobe/basal ganglia --> distribution would suggest cardiac or ascending aortic source  . T2DM (type 2 diabetes mellitus) (HCC)   . Tubular adenoma of colon     Past Surgical History:  Procedure Laterality Date  . APPENDECTOMY    . BREAST BIOPSY Right    neg  . BREAST SURGERY     1980's  . CAROTID ENDARTERECTOMY Left 01/18/2015   Procedure: CAROTID ENDARTERECTOMY; Location: [facility]; Surgeon: [name], MD  . CATARACT EXTRACTION Right 2012  . CHOLECYSTECTOMY  06/27/2012  . COLONOSCOPY WITH PROPOFOL N/A 02/17/2023   Procedure: COLONOSCOPY WITH PROPOFOL;  Surgeon: Regis Bill, MD;  Location: [facility] ENDOSCOPY;  Service: Endoscopy;  Laterality: N/A;  . DECOMPRESSIVE LUMBAR LAMINECTOMY LEVEL 1  1950  . ESOPHAGOGASTRODUODENOSCOPY (EGD) WITH PROPOFOL N/A 02/16/2023   Procedure: ESOPHAGOGASTRODUODENOSCOPY (EGD) WITH PROPOFOL;  Surgeon: Regis Bill,  MD;  Location: [facility] ENDOSCOPY;  Service: Endoscopy;  Laterality: N/A;  . MELANOMA EXCISION Left 02/06/2020   Procedure: MOHS SURGERY WITH V TO Y ADVANCEMENT FLAP (LEFT MEDIAL EYEBROW); Location: [facility]  . MOHS SURGERY Left 07/13/2022   Procedure: MOHS SURGERY (BCC LEFT MEDIAL CHEEK); Location: [facility]    Social History:  reports that she quit smoking about 46 years ago. Her smoking use included cigarettes. She has never used smokeless tobacco. She reports that she does not drink alcohol and does not use drugs.  Family History:  Family History  Problem Relation Age of Onset  . Ovarian cancer Mother   . Heart disease Brother   . Heart attack Brother   . Gout Brother   . Alzheimer's disease Brother   . Dementia Brother   . Heart attack Brother   . Diabetes Daughter   . Melanoma Daughter   . Stroke Son   . Breast cancer Neg Hx      Prior to Admission medications   Medication Sig Start Date  End Date Taking? Authorizing Provider  acetaminophen (TYLENOL) 325 MG tablet Take 2 tablets (650 mg total) by mouth every 6 (six) hours as needed for mild pain, moderate pain, fever or headache. 06/25/23   Tonna Boehringer, Isami, DO  amLODipine (NORVASC) 10 MG tablet Take 1 tablet (10 mg total) by mouth daily. 07/13/23   Jacky Kindle, FNP  aspirin EC 81 MG tablet Take 81 mg by mouth daily. Swallow whole.    [provider]  atorvastatin (LIPITOR) 40 MG tablet Take 1 tablet (40 mg total) by mouth daily. 07/13/23   Jacky Kindle, FNP  cholecalciferol (VITAMIN D) 1000 UNITS tablet Take 1,000 Units by mouth daily.     [provider]  erythromycin ophthalmic ointment 1 Application at bedtime.    [provider]  glipiZIDE (GLUCOTROL XL) 5 MG 24 hr tablet Take 1 tablet (5 mg total) by mouth daily with breakfast. 08/26/23   Jacky Kindle, FNP  hydrochlorothiazide (HYDRODIURIL) 12.5 MG tablet Take 1 tablet (12.5 mg total) by mouth every other day. 07/22/22   Jacky Kindle, FNP  meclizine (ANTIVERT)  12.5 MG tablet Take 1 tablet (12.5 mg total) by mouth 2 (two) times daily as needed for dizziness. 08/24/23   Jacky Kindle, FNP  [facility] OP Apply 1 drop to eye as needed.    [provider]  temazepam (RESTORIL) 15 MG capsule TAKE 1 CAPSULE BY MOUTH AT BEDTIME AS NEEDED FOR SLEEP 07/13/23   Jacky Kindle, FNP    Physical Exam: Vitals:   10/02/23 1632 10/02/23 2251  BP: 122/63 (!) 141/67  Pulse: (!) 103 (!) 103  Resp: 18 18  Temp: 98.1 F (36.7 C) 98.7 F (37.1 C)  TempSrc: Oral Oral  SpO2: 95% 94%  Weight: 58.5 kg   Height: 5\' 2"  (1.575 m)    General: Not in acute distress HEENT:       Eyes: PERRL, EOMI, no jaundice       ENT: No discharge from the ears and nose, no pharynx injection, no tonsillar enlargement.        Neck: No JVD, no bruit, no mass felt. Heme: No neck lymph node enlargement. Cardiac: S1/S2, RRR, No murmurs, No gallops or rubs. Respiratory: No rales, wheezing, rhonchi or rubs. GI: Soft, nondistended, nontender, no rebound pain, no organomegaly, BS present. GU: No hematuria Ext: No pitting leg edema bilaterally. 1+DP/PT pulse bilaterally. Musculoskeletal: No joint deformities, No joint redness or warmth, no limitation of ROM in spin. Skin: No rashes.  Neuro: Alert, oriented X3, cranial nerves II-XII grossly intact, moves all extremities normally. Muscle strength 5/5 in all extremities, sensation to light touch intact. Brachial reflex 2+ bilaterally. Knee reflex 1+ bilaterally. Negative Babinski's sign. Normal finger to nose test. Psych: Patient is not psychotic, no suicidal or hemocidal ideation.  Labs on Admission: I have personally reviewed following labs and imaging studies  CBC: Recent Labs  Lab 10/02/23 1633  WBC 8.9  HGB 9.1*  HCT 27.3*  MCV 87.8  PLT 313   Basic Metabolic Panel: Recent Labs  Lab 10/02/23 1633  NA 137  K 3.8  CL 102  CO2 25  GLUCOSE 216*  BUN 20  CREATININE 0.73  CALCIUM 9.0   GFR: Estimated  Creatinine Clearance: 35.5 mL/min (by C-G formula based on SCr of 0.73 mg/dL). Liver Function Tests: Recent Labs  Lab 10/02/23 1633  AST 17  ALT 14  ALKPHOS 86  BILITOT 0.2  PROT 6.9  ALBUMIN 3.0*  No results for input(s): "LIPASE", "AMYLASE" in the last 168 hours. No results for input(s): "AMMONIA" in the last 168 hours. Coagulation Profile: No results for input(s): "INR", "PROTIME" in the last 168 hours. Cardiac Enzymes: No results for input(s): "CKTOTAL", "CKMB", "CKMBINDEX", "TROPONINI" in the last 168 hours. BNP (last 3 results) No results for input(s): "PROBNP" in the last 8760 hours. HbA1C: No results for input(s): "HGBA1C" in the last 72 hours. CBG: No results for input(s): "GLUCAP" in the last 168 hours. Lipid Profile: No results for input(s): "CHOL", "HDL", "LDLCALC", "TRIG", "CHOLHDL", "LDLDIRECT" in the last 72 hours. Thyroid Function Tests: No results for input(s): "TSH", "T4TOTAL", "FREET4", "T3FREE", "THYROIDAB" in the last 72 hours. Anemia Panel: No results for input(s): "VITAMINB12", "FOLATE", "FERRITIN", "TIBC", "IRON", "RETICCTPCT" in the last 72 hours. Urine analysis:    Component Value Date/Time   COLORURINE YELLOW 07/20/2023 1212   APPEARANCEUR CLEAR 07/20/2023 1212   APPEARANCEUR Clear 06/13/2021 1019   LABSPEC 1.025 07/20/2023 1212   LABSPEC 1.010 12/15/2014 1926   PHURINE 6.0 07/20/2023 1212   GLUCOSEU 500 (A) 07/20/2023 1212   GLUCOSEU NEGATIVE 12/15/2014 1926   HGBUR NEGATIVE 07/20/2023 1212   BILIRUBINUR NEGATIVE 07/20/2023 1212   BILIRUBINUR Negative 06/13/2021 1019   BILIRUBINUR NEGATIVE 12/15/2014 1926   KETONESUR NEGATIVE 07/20/2023 1212   PROTEINUR NEGATIVE 07/20/2023 1212   NITRITE NEGATIVE 07/20/2023 1212   LEUKOCYTESUR NEGATIVE 07/20/2023 1212   LEUKOCYTESUR NEGATIVE 12/15/2014 1926   Sepsis Labs: @LABRCNTIP (procalcitonin:4,lacticidven:4) )No results found for this or any previous visit (from the past 240 hour(s)).    Radiological Exams on Admission: CT ANGIO GI BLEED  Result Date: 10/02/2023 CLINICAL DATA:  Lower GI bleed EXAM: CTA ABDOMEN AND PELVIS WITHOUT AND WITH CONTRAST TECHNIQUE: Multidetector CT imaging of the abdomen and pelvis was performed using the standard protocol during bolus administration of intravenous contrast. Multiplanar reconstructed images and MIPs were obtained and reviewed to evaluate the vascular anatomy. RADIATION DOSE REDUCTION: This exam was performed according to the departmental dose-optimization program which includes automated exposure control, adjustment of the mA and/or kV according to patient size and/or use of iterative reconstruction technique. CONTRAST:  OMNIPAQUE IOHEXOL 350 MG/ML SOLN COMPARISON:  None Available. FINDINGS: VASCULAR Aorta: Diffuse calcifications.  No aneurysm or dissection. Celiac: Patent.  No aneurysm or dissection or significant stenosis. SMA: Patent.  No aneurysm, dissection or significant stenosis. Renals: Single bilaterally, patent. IMA: Patent.  No aneurysm, dissection or significant stenosis. Inflow: Moderate atherosclerotic calcifications. No aneurysm, dissection or significant stenosis. Proximal Outflow: Atherosclerotic calcifications. No aneurysm, dissection or significant stenosis. Veins: No obvious venous abnormality within the limitations of this arterial phase study. Review of the MIP images confirms the above findings. NON-VASCULAR Lower chest: No acute abnormality. Hepatobiliary: No focal liver abnormality is seen. Status post cholecystectomy. No biliary dilatation. Pancreas: No focal abnormality or ductal dilatation. Spleen: No focal abnormality.  Normal size. Adrenals/Urinary Tract: No adrenal abnormality. No focal renal abnormality. No stones or hydronephrosis. Urinary bladder is unremarkable. Stomach/Bowel: Postoperative changes within the right colon. Sigmoid diverticulosis. No active diverticulitis. Stomach and small bowel decompressed,  unremarkable. No visible active contrast extravasation to localize GI bleed. Lymphatic: No adenopathy Reproductive: Uterus and adnexa unremarkable.  No mass. Other: No free fluid or free air. Musculoskeletal: No acute bony abnormality. IMPRESSION: VASCULAR No visible active contrast extravasation to localize GI bleed. Aortoiliac atherosclerosis. NON-VASCULAR Sigmoid diverticulosis.  No active diverticulitis. Electronically Signed   By: Charlett Nose M.D.   On: 10/02/2023 23:06  Assessment/Plan Principal Problem:   GI bleeding   Assessment and Plan: No notes have been filed under this hospital service. Service: Hospitalist      Principal Problem:   GI bleeding    DVT ppx: SQ Heparin         SQ Lovenox  Code Status: Full code   ***  Family Communication: not done, no family member is at bed side.              Yes, patient's    at bed side.       by phone  Disposition Plan:  Anticipate discharge back to previous environment  Consults called:    Admission status and Level of care: Telemetry Medical:    Med-surg bed for obs as inpt    progressive unit for obs   as inpt      SDU/inpation        {Inpatient:23812}  Dispo: The patient is from: {From:23814}              Anticipated d/c is to: {To:23815}              Anticipated d/c date is: {Days:23816}              Patient currently {Medically stable:23817}    Severity of Illness:  {Observation/Inpatient:21159}       Date of Service 10/02/2023    Lorretta Harp [facility]   If 7PM-7AM, please contact night-coverage [email or web address] 10/02/2023, 11:36 PM

## 2023-10-03 NOTE — Op Note (Signed)
[facility] Gastroenterology Patient Name: [name] Procedure Date: 10/03/2023 10:27 AM MRN: [id number] Account #: [id number] Date of Birth: 1928/11/25 Admit Type: Inpatient Age: 89 Room: [facility] ENDO ROOM 1 Gender: Female Note Status: Finalized Instrument Name: Upper Endoscope [id number] Procedure:             Upper GI endoscopy Indications:           Acute post hemorrhagic anemia, Melena Providers:             Boykin Nearing. Norma Fredrickson MD, MD Referring MD:          Daryl Eastern. Suzie Portela (Referring MD) Medicines:             Propofol per Anesthesia Complications:         No immediate complications. Estimated blood loss: None. Procedure:             Pre-Anesthesia Assessment:                        - The risks and benefits of the procedure and the                         sedation options and risks were discussed with the                         patient. All questions were answered and informed                         consent was obtained.                        - Patient identification and proposed procedure were                         verified prior to the procedure by the nurse. The                         procedure was verified in the procedure room.                        - ASA Grade Assessment: III - A patient with severe                         systemic disease.                        - After reviewing the risks and benefits, the patient                         was deemed in satisfactory condition to undergo the                         procedure.                        After obtaining informed consent, the endoscope was                         passed under direct vision. Throughout the procedure,  the patient's blood pressure, pulse, and oxygen                         saturations were monitored continuously. The Endoscope                         was introduced through the mouth, and advanced to the                         third part of duodenum. The  upper GI endoscopy was                         accomplished without difficulty. The patient tolerated                         the procedure well. Findings:      Patchy, white plaques were found in the lower third of the esophagus.       KOH studies were obtained by brushing.      The exam of the esophagus was otherwise normal.      The stomach was normal.      The examined duodenum was normal. Impression:            - Esophageal plaques were found, suspicious for                         candidiasis. Cells for cytology obtained.                        - Normal stomach.                        - Normal examined duodenum. Recommendation:        - Return patient to hospital ward for ongoing care.                        - Resume clear liquid diet.                        Discuss utility of colonoscopy to further investigate                         cause of bleeding or pursue a "watch and wait"                         approach.                        GI continuing to follow along. Procedure Code(s):     --- Professional ---                        [phone], Esophagogastroduodenoscopy, flexible,                         transoral; diagnostic, including collection of                         specimen(s) by brushing or washing, when performed                         (  separate procedure) Diagnosis Code(s):     --- Professional ---                        K92.1, Melena (includes Hematochezia)                        D62, Acute posthemorrhagic anemia                        K22.9, Disease of esophagus, unspecified CPT copyright 2022 [employer]. All rights reserved. The codes documented in this report are preliminary and upon coder review may  be revised to meet current compliance requirements. Stanton Kidney MD, MD 10/03/2023 12:50:30 PM This report has been signed electronically. Number of Addenda: 0 Note Initiated On: 10/03/2023 10:27 AM Estimated Blood Loss:  Estimated blood loss:  none.      [facility]

## 2023-10-03 NOTE — Anesthesia Preprocedure Evaluation (Signed)
Anesthesia Evaluation  Patient identified by MRN, date of birth, ID band Patient awake    Reviewed: Allergy & Precautions, NPO status , Patient's Chart, lab work & pertinent test results  History of Anesthesia Complications (+) PONV and history of anesthetic complications  Airway Mallampati: III  TM Distance: >3 FB Neck ROM: full    Dental no notable dental hx. (+) Dental Advidsory Given   Pulmonary former smoker LLL pulmonary nodule    Pulmonary exam normal        Cardiovascular hypertension, On Medications + Peripheral Vascular Disease (s/p L CEA) and +CHF  + dysrhythmias Supra Ventricular Tachycardia + Valvular Problems/Murmurs AS   EKG 02/15/24 Sinus rhythm Multiple premature complexes, vent & supraven Inferior infarct, old Baseline wander in lead(s) II III aVL aVF V2  Echo 2022  INTERPRETATION  NORMAL LEFT VENTRICULAR SYSTOLIC FUNCTION   WITH MODERATE LVH  NORMAL RIGHT VENTRICULAR SYSTOLIC FUNCTION  MILD VALVULAR REGURGITATION (See above)  MODERATE VALVULAR STENOSIS (See above)  AVA(VTI)= .99cm^2  MODERATE AS  TRIVIAL TR  MILD AR, MR  EF >55%  Closest EF: >55% (Estimated)  Aortic: MILD AR  Mitral: MILD MR  Tricuspid: TRIVIAL TR  LVH: MODERATE LVH  Dias.FxClass: (Grade 1) relaxation abnormal, E/A reversal  AVS: MODERATE AS   Per Cardiology 02/2024 Plan   Preoperative clearance for upcoming colectomy on 03/16/2023 with Dr. Tonna Boehringer. Patient is cleared from a cardiac standpoint, is at moderate risk.  EKG today revealed normal sinus rhythm, left anterior fascicular block, inferior infarct with a ventricular rate of 96 bpm. Non-rheumatic aortic valve stenosis, continue conservative management. Recommend echo for further evaluation. Carotid artery stenosis, stable, continue atorvastatin, recommend carotid ultrasound.  Hypertension, well controlled, continue current medical therapy with HCTZ and amlodipine   Hyperlipidemia, continue atorvastatin for lipid management Type 2 DM, on glipizide, management per PCP H/o TIA, management per neurology Have patient follow up in 6 months  Return in about 6 months (around 09/10/2023).      Neuro/Psych  Headaches CVA  negative psych ROS   GI/Hepatic Neg liver ROS,GERD  ,,  Endo/Other  diabetes    Renal/GU      Musculoskeletal   Abdominal   Peds  Hematology  (+) Blood dyscrasia, anemia   Anesthesia Other Findings Past Medical History: 08/05/2015: Acid reflux 02/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)     Comment:  a.) Bx (+) for invasive moderately invasive               adenocarcinoma No date: Anemia No date: Aortic atherosclerosis (HCC) No date: Aortic stenosis     Comment:  a.) TTE 05/17/2017: mild-mod AS (MPG 21.3); b.) TTE               06/17/2018: mild-mod AS (MPG 25.6/ AVA = 1.3 cm^2); c.)               TTE 12/11/2019: mod AS (MPG 19.3/ AVA = 0.90 cm^2); d.)               TTE 01/27/2021: mod AS (MPG 21.9/ AVA = 0.99 cm^2) 03/09/2023: Ascending aorta dilatation (HCC)     Comment:  a.) CT chest 03/09/2023: fusiform dilatation measuring               4.3 cm No date: Basal cell carcinoma (BCC) of left medial cheek     Comment:  a.) s/p Mohs surgery 07/13/2022 at [facility] No date: Bilateral carotid artery disease (HCC)     Comment:  a.) doppler 04/03/2007: 50-75% RICA; b.) s/p LEFT CEA               with CorMatrix patch angioplasty 01/18/2015; c.) doppler               11/02/2016: 40-59% RICA; d.) doppler 01/10/2018: 1-39%               RICA 08/05/2015: Diverticulosis 06/10/2021: Eczema of both upper extremities No date: Heart failure with preserved left ventricular function  (HFpEF) (HCC)     Comment:  a.) TTE 05/17/2017: EF >55%, mild LVH, mild LAE, mild               panval regurg; b.) TTE 06/27/2018 : EF >55%, mod LVH,               mild LAE, mild panval regurg'; G1dd; c.) TTE 12/07/2019:               EF >55%, mild  LVH, mild MAC, mild panval regurg; d.) TTE               01/27/2021: EF >55%, mod LVH, triv TR, mild AR/MR, G1DD No date: Heart murmur No date: History of right cataract extraction No date: Hyperlipidemia No date: Hypertension No date: Insomnia     Comment:  a.) on BZO (temazepam) PRN 04/20/2017: Irritable bowel syndrome with diarrhea 08/05/2015: LBP (low back pain) No date: Melanoma (HCC)     Comment:  a.) s/p Mohs surgery 02/06/2020 - LEFT medial brow --> V              to Y advancement flap No date: Melena 03/09/2023: Non-small cell lung cancer (NSCLC) (HCC)     Comment:  a.) CT chest 03/09/2023 --> superior segment LLL --> 2.1              x 1.7 x 2.1 cm mixed ground-glass and subpleural nodule               extending to pleura with irreg margins --> concerning for              primary bronchogenic carcinoma rather than metastatic               disease; b.) 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               (60 Gy over 6 fractions) No date: OAB (overactive bladder) No date: PONV (postoperative nausea and vomiting)     Comment:  a.) single episode following cataract surgery 02/22/2015: PSVT (paroxysmal supraventricular tachycardia)     Comment:  a.) holter 02/22/2015: 6 runs with longest lasting 10               beats 04/02/2007: Right pontine stroke [facility])     Comment:  a.) MRI brain 04/02/2007 - subacute RIGHT pontine CVA 12/16/2014: Stroke [facility])     Comment:  a.) MRI brain 12/16/2014: punctate foci of restricted               diffusion x 5 consistent with microembolic infarctions               affecting the RIGHT lateral medulla, LEFT temporal lobe,               2 foci in the RIGHT thalamus, and LEFT inferior  frontal               lobe/basal ganglia --> distribution would suggest cardiac  or ascending aortic source No date: T2DM (type 2 diabetes mellitus) (HCC) No date: Tubular adenoma of colon  Past Surgical History: No date: APPENDECTOMY No date:  BREAST BIOPSY; Right     Comment:  neg No date: BREAST SURGERY     Comment:  1980's 01/18/2015: CAROTID ENDARTERECTOMY; Left     Comment:  Procedure: CAROTID ENDARTERECTOMY; Location: [facility];               Surgeon: [name], MD 2012: CATARACT EXTRACTION; Right 06/27/2012: CHOLECYSTECTOMY 02/17/2023: COLONOSCOPY WITH PROPOFOL; N/A     Comment:  Procedure: COLONOSCOPY WITH PROPOFOL;  Surgeon:               Regis Bill, MD;  Location: [facility] ENDOSCOPY;                Service: Endoscopy;  Laterality: N/A; 1950: DECOMPRESSIVE LUMBAR LAMINECTOMY LEVEL 1 02/16/2023: ESOPHAGOGASTRODUODENOSCOPY (EGD) WITH PROPOFOL; N/A     Comment:  Procedure: ESOPHAGOGASTRODUODENOSCOPY (EGD) WITH               PROPOFOL;  Surgeon: Regis Bill, MD;  Location:               [facility] ENDOSCOPY;  Service: Endoscopy;  Laterality: N/A; 02/06/2020: MELANOMA EXCISION; Left     Comment:  Procedure: MOHS SURGERY WITH V TO Y ADVANCEMENT FLAP               (LEFT MEDIAL EYEBROW); Location: [facility] 07/13/2022: MOHS SURGERY; Left     Comment:  Procedure: MOHS SURGERY (BCC LEFT MEDIAL CHEEK);               Location: [facility]  BMI    Body Mass Index: 24.14 kg/m      Reproductive/Obstetrics negative OB ROS                             Anesthesia Physical Anesthesia Plan  ASA: 3  Anesthesia Plan: General   Post-op Pain Management: Minimal or no pain anticipated   Induction: Intravenous  PONV Risk Score and Plan: 3 and Propofol infusion, TIVA and Ondansetron  Airway Management Planned: Nasal Cannula  Additional Equipment: None  Intra-op Plan:   Post-operative Plan:   Informed Consent: I have reviewed the patients History and Physical, chart, labs and discussed the procedure including the risks, benefits and alternatives for the proposed anesthesia with the patient or authorized representative who has indicated his/her understanding and acceptance.     Dental advisory  given  Plan Discussed with: CRNA and Surgeon  Anesthesia Plan Comments: (Discussed risks of anesthesia with patient, including possibility of difficulty with spontaneous ventilation under anesthesia necessitating airway intervention, PONV, and rare risks such as cardiac or respiratory or neurological events, and allergic reactions. Discussed the role of CRNA in patient's perioperative care. Patient understands.)       Anesthesia Quick Evaluation

## 2023-10-03 NOTE — Assessment & Plan Note (Signed)
Melena --GI following --EGD planned today --Trend Hbg & transfuse if < 7 or < 8 with active signs of bleeding --IV PPI BID --Hold ASA --IV fluids until diet is resumed --

## 2023-10-03 NOTE — Progress Notes (Signed)
Progress Note   Patient: [name] ZOX:[id number] DOB: 1929/08/28 DOA: 10/02/2023     1 DOS: the patient was seen and examined on 10/03/2023   Brief hospital course:  [name] is a 89 y.o. female with medical history significant of colon cancer (s/p of partial colectomy), hypertension, hyperlipidemia, DM, diastolic CHF, stroke, GERD, bilateral carotid artery stenosis, diverticulosis, PSVT, IBS, who presented on 10/02/2023 for evaluation of dark stools x several episodes over ~11 days, progressive fatigue and generalized weakness.  Pt was found anemic with Hbg 9.1, down from 11.2 just two months ago. Pt was admitted for further evaluation and management with Gastroenterology consulted.  Further hospital course and management as outlined below.    Assessment and Plan:  GI bleeding and acute blood loss anemia:  Melena Hgb on admission 9.1 down from 11.2 in Sept.  --GI is consulted --EGD today --Holding aspirin --Diet per GI post-procedure --Maintenance IV fluids until taking adequate PO --IV Protonix BID --Zofran IV PRN nausea --Avoid NSAIDs and SQ heparin --Trend Hbg and transfuse if < 7 or < 8 with signs of active bleeding  Generalized weakness -- due to above --PT/OT evaluations --Fall precautions   Chronic diastolic CHF  - stable 2D echo on 08/12/2023 showed EF of 60-85% with grade 1 diastolic dysfunction.   --Monitor volume status   Hypertension associated with diabetes (HCC) --IV hydralazine PRN --Holding home meds: amlodipine and HCTZ (due to risk of hypotension due to GI bleeding)   Hyperlipidemia associated with type 2 diabetes mellitus (HCC) --Lipitor   Type 2 diabetes mellitus with hyperglycemia, without long-term current use of insulin (HCC): Recent A1c 7.5, poorly controlled.   --Hold home glipizide  --Sliding scale Novolog   History of CVA (cerebrovascular accident) --Continue Lipitor --Holding aspirin   Colon cancer  --S/p of partial  colectomy   Lung nodule: Patient was recently found to have lung nodule.   Seen by Dr. Karna Christmas of pulmonology on 09/29/2023.   Appears being worked up for ?metastasized disease.   --Follow up as scheduled with Dr. Karna Christmas    Primary insomnia -- Continue home Restoril      Subjective: Pt seen this AM, daughter-in-law at bedside.  Pt reports feeling generally weak and fatigued.  Black / dark stools are reportedly ongoing.  Dr. Norma Fredrickson was already in and pt going to EGD later today.  She denies other acute complaints.   Physical Exam: Vitals:   10/03/23 0719 10/03/23 1214 10/03/23 1245 10/03/23 1300  BP: 119/62 121/75 118/65 114/65  Pulse: 86 97 79   Resp: 18 18 (!) 24 (!) 22  Temp: 98.8 F (37.1 C)  98.1 F (36.7 C)   TempSrc:      SpO2: 95% 96% 94% 95%  Weight:      Height:       General exam: awake, alert, no acute distress HEENT: moist mucus membranes, hearing grossly normal  Respiratory system: CTAB, no wheezes, rales or rhonchi, normal respiratory effort. Cardiovascular system: normal S1/S2, RRR, 2/6 systolic murmur noted, no pedal edema.   Gastrointestinal system: soft, NT, ND, no HSM felt, +bowel sounds. Central nervous system: A&O x3. no gross focal neurologic deficits, normal speech Extremities: moves all, no edema, normal tone Skin: dry, intact, normal temperature Psychiatry: normal mood, congruent affect, judgement and insight appear normal   Data Reviewed:  Notable labs ---  Hbg - 9.1 >> 7.9 >> 8.3 >> 7.9 Normal iron studies, folate, and B12 BMP normal except glucose 185, Ca 8.4  Family Communication: daughter-in-law at bedside on rounds  Disposition: Status is: Inpatient Remains inpatient appropriate because: ongoing evaluation   Planned Discharge Destination: Home    Time spent: 45 minutes  Author: Pennie Banter, DO 10/03/2023 1:22 PM  For on call review [email or web address].

## 2023-10-03 NOTE — Consult Note (Signed)
[facility] GI Inpatient Consult Note   [name], M.D.  Reason for Consult: Melena, anemia post hemorrhage.   Attending Requesting Consult: [name], D.O.  Outpatient Primary Physician: Whitney Kindle, FNP  History of Present Illness: [name] is a 89 y.o. female with a history of colon cancer status post partial colectomy, non-small cell lung cancer , type 2 diabetes mellitus, hypertension, hyperlipidemia and benign positional vertigo who presented to the emergency room with 9 to 10 days of progressive "black stools".  Patient's daughter, [name], in the room says the patient neglected to tell anyone in the family about the bleeding until follow-up office visit with her doctor a few days ago.  She phoned into Dr. Mart Piggs office and was advised to get labs and begin Prilosec.  Due to weakness, however, she reported to the emergency room yesterday evening and was admitted.  She has had no further melenic stools since being in the hospital and has been made NPO.  She does not take any prescription anticoagulation.  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   a.) CT chest 03/09/2023: fusiform dilatation measuring 4.3 cm   Basal cell carcinoma (BCC) of left medial cheek    a.) s/p Mohs surgery 07/13/2022 at [facility]   Bilateral carotid artery disease [facility])    a.) doppler 04/03/2007: 50-75% RICA; b.) s/p LEFT CEA with CorMatrix patch angioplasty 01/18/2015; c.) doppler 11/02/2016: 40-59%  RICA; d.) doppler 01/10/2018: 1-39% RICA   Diverticulosis 08/05/2015   Eczema of both upper extremities 06/10/2021   Heart failure with preserved left ventricular function (HFpEF) (HCC)    a.) TTE 05/17/2017: EF >55%, mild LVH, mild LAE, mild panval regurg; b.) TTE 06/27/2018 : EF >55%, mod LVH, mild LAE, mild panval regurg'; G1dd; c.) TTE 12/07/2019: EF >55%, mild LVH, mild MAC, mild panval regurg; d.) TTE 01/27/2021: EF >55%, mod LVH, triv TR, mild AR/MR, G1DD   Heart murmur    History of right cataract extraction    Hyperlipidemia    Hypertension    Insomnia    a.) on BZO (temazepam) PRN   Irritable bowel syndrome with diarrhea 04/20/2017   LBP (low back pain) 08/05/2015   Melanoma (HCC)    a.) s/p Mohs surgery 02/06/2020 - LEFT medial brow --> V to Y advancement flap   Melena    Non-small cell lung cancer (NSCLC) (HCC) 03/09/2023   a.) CT chest 03/09/2023 --> superior segment LLL --> 2.1 x 1.7 x 2.1 cm mixed ground-glass and subpleural nodule extending to pleura with irreg margins --> concerning for primary bronchogenic carcinoma rather than metastatic disease; b.) 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 (60 Gy over 6 fractions)   OAB (overactive bladder)    PONV (postoperative nausea and vomiting)    a.) single episode following cataract surgery   PSVT (paroxysmal supraventricular tachycardia) (HCC) 02/22/2015   a.) holter 02/22/2015: 6 runs with longest lasting 10 beats   Right pontine  stroke (HCC) 04/02/2007   a.) MRI brain 04/02/2007 - subacute RIGHT pontine CVA   Stroke (HCC) 12/16/2014   a.) MRI brain 12/16/2014: punctate foci of restricted diffusion x 5 consistent with microembolic infarctions affecting the RIGHT lateral medulla, LEFT temporal lobe, 2 foci in the RIGHT thalamus, and LEFT inferior  frontal lobe/basal ganglia --> distribution would suggest cardiac or ascending aortic source   T2DM (type 2 diabetes mellitus) (HCC)    Tubular adenoma of colon     Problem  List: Patient Active Problem List   Diagnosis Date Noted   GI bleeding 10/02/2023   Lung nodule 10/02/2023   Dizziness 07/21/2023   Stroke-like symptom 07/20/2023   Encounter for subsequent annual wellness visit (AWV) in Medicare patient 07/13/2023   Hypertension associated with diabetes (HCC) 07/13/2023   Hyperlipidemia associated with type 2 diabetes mellitus (HCC) 07/13/2023   Type 2 diabetes mellitus with hyperglycemia, without long-term current use of insulin (HCC) 07/13/2023   Primary insomnia 07/13/2023   Colon cancer (HCC) 06/21/2023   Malignant neoplasm of colon (HCC) 02/24/2023   Melena 02/15/2023   Acute blood loss anemia 02/15/2023   Chronic diastolic CHF (congestive heart failure) (HCC) 02/15/2023   Annual physical exam 07/06/2022   Moderate aortic stenosis 08/05/2015   History of CVA (cerebrovascular accident) 08/05/2015   OP (osteoporosis) 08/05/2015   Superficial thrombophlebitis 08/05/2015    Past Surgical History: Past Surgical History:  Procedure Laterality Date   APPENDECTOMY     BREAST BIOPSY Right    neg   BREAST SURGERY     1980's   CAROTID ENDARTERECTOMY Left 01/18/2015   Procedure: CAROTID ENDARTERECTOMY; Location: [facility]; Surgeon: [name], MD   CATARACT EXTRACTION Right 2012   CHOLECYSTECTOMY  06/27/2012   COLONOSCOPY WITH PROPOFOL N/A 02/17/2023   Procedure: COLONOSCOPY WITH PROPOFOL;  Surgeon: Regis Bill, MD;  Location: [facility] ENDOSCOPY;  Service: Endoscopy;  Laterality: N/A;   DECOMPRESSIVE LUMBAR LAMINECTOMY LEVEL 1  1950   ESOPHAGOGASTRODUODENOSCOPY (EGD) WITH PROPOFOL N/A 02/16/2023   Procedure: ESOPHAGOGASTRODUODENOSCOPY (EGD) WITH PROPOFOL;  Surgeon: Regis Bill, MD;  Location: [facility] ENDOSCOPY;  Service: Endoscopy;  Laterality: N/A;   MELANOMA EXCISION Left 02/06/2020   Procedure: MOHS SURGERY WITH V TO Y ADVANCEMENT FLAP (LEFT MEDIAL EYEBROW); Location: [facility]   MOHS SURGERY Left 07/13/2022   Procedure: MOHS SURGERY (BCC  LEFT MEDIAL CHEEK); Location: [facility]    Allergies: Allergies  Allergen Reactions   Cephalosporins     "upset stomach"   Codeine    Macrolides And Ketolides     "upset stomach" Other reaction(s): Unknown "upset stomach"   Nitrofurantoin     Other reaction(s): Other (See Comments)   Penicillins    Tramadol Nausea Only   Doxycycline Rash   Sulfa Antibiotics Rash    Home Medications: Medications Prior to Admission  Medication Sig Dispense Refill Last Dose   acetaminophen (TYLENOL) 325 MG tablet Take 2 tablets (650 mg total) by mouth every 6 (six) hours as needed for mild pain, moderate pain, fever or headache. 30 tablet 0 Past Week   amLODipine (NORVASC) 10 MG tablet Take 1 tablet (10 mg total) by mouth daily. 90 tablet 3 Past Week   aspirin EC 81 MG tablet Take 81 mg by mouth daily. Swallow whole.   Past Week   atorvastatin (LIPITOR) 40 MG tablet Take 1 tablet (40 mg total) by mouth daily. 90 tablet 3 Past Week   cholecalciferol (VITAMIN D) 1000 UNITS tablet Take 1,000 Units by mouth daily.  Past Week   erythromycin ophthalmic ointment 1 Application at bedtime.   Past Week   glipiZIDE (GLUCOTROL XL) 5 MG 24 hr tablet Take 1 tablet (5 mg total) by mouth daily with breakfast. 90 tablet 3 Past Week   hydrochlorothiazide (HYDRODIURIL) 12.5 MG tablet Take 1 tablet (12.5 mg total) by mouth every other day. 90 tablet 3 Past Week   meclizine (ANTIVERT) 12.5 MG tablet Take 1 tablet (12.5 mg total) by mouth 2 (two) times daily as needed for dizziness. 30 tablet 0 Past Week   NEOMYCIN-POLYMYXIN-HC OP Apply 1 drop to eye as needed.   Past Week   temazepam (RESTORIL) 15 MG capsule TAKE 1 CAPSULE BY MOUTH AT BEDTIME AS NEEDED FOR SLEEP 90 capsule 3 10/02/2023   Home medication reconciliation was completed with the patient.   Scheduled Inpatient Medications:    atorvastatin  40 mg Oral Daily   erythromycin  1 Application Both Eyes QHS   insulin aspart  0-5 Units Subcutaneous QHS   insulin  aspart  0-9 Units Subcutaneous TID WC   pantoprazole (PROTONIX) IV  40 mg Intravenous Q12H    Continuous Inpatient Infusions:    sodium chloride 50 mL/hr at 10/03/23 0655    PRN Inpatient Medications:  acetaminophen, hydrALAZINE, meclizine, ondansetron (ZOFRAN) IV, temazepam  Family History: family history includes Alzheimer's disease in her brother; Dementia in her brother; Diabetes in her daughter; Gout in her brother; Heart attack in her brother and brother; Heart disease in her brother; Melanoma in her daughter; Ovarian cancer in her mother; Stroke in her son.   GI Family History: Negative  Social History:   reports that she quit smoking about 46 years ago. Her smoking use included cigarettes. She has never used smokeless tobacco. She reports that she does not drink alcohol and does not use drugs. The patient denies ETOH, tobacco, or drug use.    Review of Systems: Review of Systems - Negative except HPI  Physical Examination: BP 119/62 (BP Location: Left Arm)   Pulse 86   Temp 98.8 F (37.1 C)   Resp 18   Ht 5\' 2"  (1.575 m)   Wt 58.5 kg   SpO2 95%   BMI 23.59 kg/m  Physical Exam Vitals reviewed.  Constitutional:      General: She is not in acute distress.    Appearance: Normal appearance. She is normal weight. She is not ill-appearing.  HENT:     Head: Normocephalic and atraumatic.     Nose: Nose normal.     Mouth/Throat:     Mouth: Mucous membranes are moist.     Pharynx: Oropharynx is clear.  Eyes:     General: No scleral icterus.    Extraocular Movements: Extraocular movements intact.     Pupils: Pupils are equal, round, and reactive to light.  Cardiovascular:     Rate and Rhythm: Normal rate.     Pulses: Normal pulses.  Pulmonary:     Effort: Pulmonary effort is normal. No respiratory distress.     Breath sounds: No stridor. No wheezing or rhonchi.  Chest:     Chest wall: No tenderness.  Abdominal:     General: There is no distension.      Palpations: Abdomen is soft.     Tenderness: There is abdominal tenderness. There is no guarding or rebound.  Musculoskeletal:        General: Normal range of motion.  Skin:    General: Skin is warm and dry.  Capillary Refill: Capillary refill takes less than 2 seconds.  Neurological:     General: No focal deficit present.     Mental Status: She is alert and oriented to person, place, and time.  Psychiatric:        Mood and Affect: Mood normal.        Behavior: Behavior normal.        Thought Content: Thought content normal.        Judgment: Judgment normal.     Data: Lab Results  Component Value Date   WBC 9.6 10/03/2023   HGB 8.3 (L) 10/03/2023   HCT 25.8 (L) 10/03/2023   MCV 87.8 10/03/2023   PLT 308 10/03/2023   Recent Labs  Lab 10/02/23 1633 10/03/23 0009 10/03/23 0617  HGB 9.1* 7.9* 8.3*   Lab Results  Component Value Date   NA 136 10/03/2023   K 3.6 10/03/2023   CL 103 10/03/2023   CO2 24 10/03/2023   BUN 20 10/03/2023   CREATININE 0.69 10/03/2023   GLU 133 04/16/2015   Lab Results  Component Value Date   ALT 14 10/02/2023   AST 17 10/02/2023   ALKPHOS 86 10/02/2023   BILITOT 0.2 10/02/2023   Recent Labs  Lab 10/03/23 0127  APTT 27  INR 1.3*      Latest Ref Rng & Units 10/03/2023    6:17 AM 10/03/2023   12:09 AM 10/02/2023    4:33 PM  CBC  WBC 4.0 - 10.5 K/uL 9.6  8.1  8.9   Hemoglobin 12.0 - 15.0 g/dL 8.3  7.9  9.1   Hematocrit 36.0 - 46.0 % 25.8  24.3  27.3   Platelets 150 - 400 K/uL 308  242  313     STUDIES: CT ANGIO GI BLEED  Result Date: 10/02/2023 CLINICAL DATA:  Lower GI bleed EXAM: CTA ABDOMEN AND PELVIS WITHOUT AND WITH CONTRAST TECHNIQUE: Multidetector CT imaging of the abdomen and pelvis was performed using the standard protocol during bolus administration of intravenous contrast. Multiplanar reconstructed images and MIPs were obtained and reviewed to evaluate the vascular anatomy. RADIATION DOSE REDUCTION: This exam was  performed according to the departmental dose-optimization program which includes automated exposure control, adjustment of the mA and/or kV according to patient size and/or use of iterative reconstruction technique. CONTRAST:  OMNIPAQUE IOHEXOL 350 MG/ML SOLN COMPARISON:  None Available. FINDINGS: VASCULAR Aorta: Diffuse calcifications.  No aneurysm or dissection. Celiac: Patent.  No aneurysm or dissection or significant stenosis. SMA: Patent.  No aneurysm, dissection or significant stenosis. Renals: Single bilaterally, patent. IMA: Patent.  No aneurysm, dissection or significant stenosis. Inflow: Moderate atherosclerotic calcifications. No aneurysm, dissection or significant stenosis. Proximal Outflow: Atherosclerotic calcifications. No aneurysm, dissection or significant stenosis. Veins: No obvious venous abnormality within the limitations of this arterial phase study. Review of the MIP images confirms the above findings. NON-VASCULAR Lower chest: No acute abnormality. Hepatobiliary: No focal liver abnormality is seen. Status post cholecystectomy. No biliary dilatation. Pancreas: No focal abnormality or ductal dilatation. Spleen: No focal abnormality.  Normal size. Adrenals/Urinary Tract: No adrenal abnormality. No focal renal abnormality. No stones or hydronephrosis. Urinary bladder is unremarkable. Stomach/Bowel: Postoperative changes within the right colon. Sigmoid diverticulosis. No active diverticulitis. Stomach and small bowel decompressed, unremarkable. No visible active contrast extravasation to localize GI bleed. Lymphatic: No adenopathy Reproductive: Uterus and adnexa unremarkable.  No mass. Other: No free fluid or free air. Musculoskeletal: No acute bony abnormality. IMPRESSION: VASCULAR No visible active contrast extravasation to  localize GI bleed. Aortoiliac atherosclerosis. NON-VASCULAR Sigmoid diverticulosis.  No active diverticulitis. Electronically Signed   By: Charlett Nose M.D.   On:  10/02/2023 23:06   @IMAGES @  Assessment: Melena - Probable UGI source of bleeding. Differential diagnosis includes peptic ulcer disease, gastritis, dieulafoy lesion, gastric malignancy. Only gastritis noted at last endoscopy performed by Dr. Mia Creek on 02/17/2023.  Anemia secondary to chronic blood loss. History of colon cancer of right colon s/p right hemicolectomy - Dr. Tonna Boehringer - 06/21/2023. Chronic diastolic CHF - stable clinically, per primary team. Recent diagnosis of pulmonary nodule, probable bronchogenic carcinoma vs. Metastatic lesion. Workup ongoing by pulmonary service (Dr. Karna Christmas), oncology.    Recommendations: Maintain NPO. Serial H/H. Acid suppressors. I advise EGD. The patient understands the nature of the planned procedure, indications, risks, alternatives and potential complications including but not limited to bleeding, infection, perforation, damage to internal organs and possible oversedation/side effects from anesthesia. The patient agrees and gives consent to proceed.  Please refer to procedure notes for findings, recommendations and patient disposition/instructions.   Thank you for the consult. Please call with questions or concerns.  Rosina Lowenstein, "Mellody Dance MD [facility] [address] [place], [place] [address] [phone]  10/03/2023 8:53 AM

## 2023-10-03 NOTE — Transfer of Care (Signed)
Immediate Anesthesia Transfer of Care Note  Patient: [name]  Procedure(s) Performed: ESOPHAGOGASTRODUODENOSCOPY (EGD) ESOPHAGEAL BRUSHING  Patient Location: PACU  Anesthesia Type:General  Level of Consciousness: awake, alert , and oriented  Airway & Oxygen Therapy: Patient Spontanous Breathing  Post-op Assessment: Report given to RN and Post -op Vital signs reviewed and stable  Post vital signs: Reviewed and stable  Last Vitals:  Vitals Value Taken Time  BP 118/65 10/03/23 1246  Temp    Pulse 85 10/03/23 1248  Resp 22 10/03/23 1248  SpO2 95 % 10/03/23 1248  Vitals shown include unfiled device data.  Last Pain:  Vitals:   10/03/23 1214  TempSrc:   PainSc: 0-No pain         Complications: No notable events documented.

## 2023-10-03 NOTE — Brief Op Note (Signed)
Esophageal brushing KOH sent to lab

## 2023-10-03 NOTE — Anesthesia Postprocedure Evaluation (Signed)
Anesthesia Post Note  Patient: COHEN LEGARDA  Procedure(s) Performed: ESOPHAGOGASTRODUODENOSCOPY (EGD) ESOPHAGEAL BRUSHING  Patient location during evaluation: PACU Anesthesia Type: General Level of consciousness: awake and alert Pain management: pain level controlled Vital Signs Assessment: post-procedure vital signs reviewed and stable Respiratory status: spontaneous breathing, nonlabored ventilation, respiratory function stable and patient connected to nasal cannula oxygen Cardiovascular status: blood pressure returned to baseline and stable Postop Assessment: no apparent nausea or vomiting Anesthetic complications: no  No notable events documented.   Last Vitals:  Vitals:   10/03/23 1300 10/03/23 1658  BP: 114/65 109/63  Pulse:  91  Resp: (!) 22 18  Temp:  37.1 C  SpO2: 95% 96%    Last Pain:  Vitals:   10/03/23 1300  TempSrc:   PainSc: 0-No pain                 [name]

## 2023-10-04 ENCOUNTER — Encounter: Payer: Self-pay | Admitting: Internal Medicine

## 2023-10-04 DIAGNOSIS — K921 Melena: Secondary | ICD-10-CM | POA: Diagnosis not present

## 2023-10-04 DIAGNOSIS — D62 Acute posthemorrhagic anemia: Secondary | ICD-10-CM | POA: Diagnosis not present

## 2023-10-04 LAB — BASIC METABOLIC PANEL
Anion gap: 7 (ref 5–15)
BUN: 25 mg/dL — ABNORMAL HIGH (ref 8–23)
CO2: 26 mmol/L (ref 22–32)
Calcium: 8.1 mg/dL — ABNORMAL LOW (ref 8.9–10.3)
Chloride: 104 mmol/L (ref 98–111)
Creatinine, Ser: 0.82 mg/dL (ref 0.44–1.00)
GFR, Estimated: >60 mL/min (ref >60)
Glucose, Bld: 184 mg/dL — ABNORMAL HIGH (ref 70–99)
Potassium: 3.5 mmol/L (ref 3.5–5.1)
Sodium: 137 mmol/L (ref 135–145)

## 2023-10-04 LAB — GLUCOSE, CAPILLARY
Glucose-Capillary: 170 mg/dL — ABNORMAL HIGH (ref 70–99)
Glucose-Capillary: 213 mg/dL — ABNORMAL HIGH (ref 70–99)
Glucose-Capillary: 119 mg/dL — ABNORMAL HIGH (ref 70–99)
Glucose-Capillary: 208 mg/dL — ABNORMAL HIGH (ref 70–99)

## 2023-10-04 LAB — CBC
HCT: 20.2 % — ABNORMAL LOW (ref 36.0–46.0)
Hemoglobin: 6.6 g/dL — ABNORMAL LOW (ref 12.0–15.0)
MCH: 29.5 pg (ref 26.0–34.0)
MCHC: 32.7 g/dL (ref 30.0–36.0)
MCV: 90.2 fL (ref 80.0–100.0)
Platelets: 231 10*3/uL (ref 150–400)
RBC: 2.24 MIL/uL — ABNORMAL LOW (ref 3.87–5.11)
RDW: 13 % (ref 11.5–15.5)
WBC: 8 10*3/uL (ref 4.0–10.5)
nRBC: 0 % (ref 0.0–0.2)

## 2023-10-04 LAB — HEMOGLOBIN AND HEMATOCRIT, BLOOD
HCT: 27.4 % — ABNORMAL LOW (ref 36.0–46.0)
Hemoglobin: 8.9 g/dL — ABNORMAL LOW (ref 12.0–15.0)

## 2023-10-04 LAB — MAGNESIUM: Magnesium: 2.1 mg/dL (ref 1.7–2.4)

## 2023-10-04 LAB — PREPARE RBC (CROSSMATCH)

## 2023-10-04 MED ORDER — FLUCONAZOLE 100 MG PO TABS
200.0000 mg | ORAL_TABLET | Freq: Every day | ORAL | Status: DC
  Administered 2023-10-05 – 2023-10-07 (×3): 200 mg via ORAL
  Filled 2023-10-04 (×3): qty 2

## 2023-10-04 MED ORDER — SODIUM CHLORIDE 0.9% IV SOLUTION: Freq: Once | INTRAVENOUS | Status: AC

## 2023-10-04 MED ORDER — WITCH HAZEL-GLYCERIN EX PADS
MEDICATED_PAD | CUTANEOUS | Status: DC | PRN
  Filled 2023-10-04: qty 100

## 2023-10-04 MED ORDER — SODIUM CHLORIDE 0.9 % IV SOLN: INTRAVENOUS | Status: AC

## 2023-10-04 MED ORDER — SODIUM CHLORIDE 0.9 % IV SOLN: INTRAVENOUS | Status: DC

## 2023-10-04 MED ORDER — FLUCONAZOLE 100 MG PO TABS
400.0000 mg | ORAL_TABLET | Freq: Once | ORAL | Status: AC
  Administered 2023-10-04: 400 mg via ORAL
  Filled 2023-10-04: qty 4

## 2023-10-04 MED ORDER — PEG 3350-KCL-NA BICARB-NACL 420 G PO SOLR
4000.0000 mL | Freq: Once | ORAL | Status: AC
  Administered 2023-10-04: 4000 mL via ORAL
  Filled 2023-10-04: qty 4000

## 2023-10-04 NOTE — Progress Notes (Signed)
GI Inpatient Follow-up Note  Subjective:  Patient seen in follow-up for melena/blood loss anemia. No acute events overnight. Hemoglobin 6.5 this morning. She reports 3-4 episodes of black stool yesterday during the day. No new complaints. She continues to feel weak.   Scheduled Inpatient Medications:   atorvastatin  40 mg Oral Daily   erythromycin  1 Application Both Eyes QHS   feeding supplement  1 Container Oral TID BM   insulin aspart  0-5 Units Subcutaneous QHS   insulin aspart  0-9 Units Subcutaneous TID WC   pantoprazole (PROTONIX) IV  40 mg Intravenous Q12H    Continuous Inpatient Infusions:    sodium chloride Stopped (10/03/23 1900)    PRN Inpatient Medications:  acetaminophen, hydrALAZINE, meclizine, ondansetron (ZOFRAN) IV, mouth rinse, temazepam  Review of Systems: Constitutional: Weight is stable.  Eyes: No changes in vision. ENT: No oral lesions, sore throat.  GI: see HPI.  Heme/Lymph: No easy bruising.  CV: No chest pain.  GU: No hematuria.  Integumentary: No rashes.  Neuro: No headaches.  Psych: No depression/anxiety.  Endocrine: No heat/cold intolerance.  Allergic/Immunologic: No urticaria.  Resp: No cough, SOB.  Musculoskeletal: No joint swelling.    Physical Examination: BP 133/70 (BP Location: Left Arm)   Pulse 96   Temp 98.7 F (37.1 C)   Resp 17   Ht 5\' 2"  (1.575 m)   Wt 58.5 kg   SpO2 98%   BMI 23.59 kg/m  Gen: NAD, alert and oriented x 4 HEENT: PEERLA, EOMI, Neck: supple, no JVD or thyromegaly Chest: CTA bilaterally, no wheezes, crackles, or other adventitious sounds CV: RRR, no m/g/c/r Abd: soft, NT, ND, +BS in all four quadrants; no HSM, guarding, ridigity, or rebound tenderness Ext: no edema, well perfused with 2+ pulses, Skin: no rash or lesions noted Lymph: no LAD  Data: Lab Results  Component Value Date   WBC 8.0 10/04/2023   HGB 6.6 (L) 10/04/2023   HCT 20.2 (L) 10/04/2023   MCV 90.2 10/04/2023   PLT 231 10/04/2023    Recent Labs  Lab 10/03/23 1725 10/03/23 2335 10/04/23 0502  HGB 7.5* 6.5* 6.6*   Lab Results  Component Value Date   NA 137 10/04/2023   K 3.5 10/04/2023   CL 104 10/04/2023   CO2 26 10/04/2023   BUN 25 (H) 10/04/2023   CREATININE 0.82 10/04/2023   GLU 133 04/16/2015   Lab Results  Component Value Date   ALT 14 10/02/2023   AST 17 10/02/2023   ALKPHOS 86 10/02/2023   BILITOT 0.2 10/02/2023   Recent Labs  Lab 10/03/23 0127  APTT 27  INR 1.3*   EGD 10/03/2023: - esophageal plaques found suspicious for candidiasis - normal stomach - normal duodenum  Assessment/Plan:  89 y/o Caucasian female with a PMH of HTN, HLD, BPPV, T2DM, hx of colon cancer s/p partial colectomy, hx of non-small cell lung cancer, bilateral carotid artery disease, dCHF, pSVT insomnia, IBS-D, and GERD presented to the [facility] ED 11/16 for chief complaint of progressive fatigue, generalized weakness, and melena. She is s/p EGD yesterday which was negative.   Melena  Anemia 2/2 chronic blood loss  Hx of colon cancer s/p right hemicolectomy by Dr. Tonna Boehringer 06/21/23  Esophageal candidiasis - KOH Prep + 10/03/23  Chronic dCHF  Recent dx of pulmonary nodule probable bronchogenic carcinoma versus metastatic lesions - work-up ongoing  Recommendations:  - Plan on colonoscopy tomorrow with Dr. Norma Fredrickson  - Transfuse 1 unit pRBC today - Continue to  monitor serial H&H. Transfuse for Hgb <7.0.  - Continue to monitor for signs of bleeding - Continue PPI for gastric protection  - Continue supportive care per primary team - Start fluconazole for treatment of esophageal candidiasis - Clear liquid diet today. Bowel prep later today. NPO after midnight.  - See procedure note for findings and further recs  I reviewed the risks (including bleeding, perforation, infection, anesthesia complications, cardiac/respiratory complications), benefits and alternatives of colonoscopy . Patient consents to proceed.    Please  call with questions or concerns.    Jacob Moores, PA-C [facility] [phone]

## 2023-10-04 NOTE — Plan of Care (Signed)

## 2023-10-04 NOTE — Plan of Care (Signed)
  Problem: Health Behavior/Discharge Planning: Goal: Ability to manage health-related needs will improve Outcome: Progressing   

## 2023-10-04 NOTE — Progress Notes (Signed)
PT Cancellation Note  Patient Details Name: [name] MRN: [id number] DOB: 1929-11-01   Cancelled Treatment:    Reason Eval/Treat Not Completed: Patient not medically ready. Per chart review, pt's Hgb 6.6 and order for blood transfusion in place. Will hold on PT session until medically stable and follow up at later date.    Shauna Hugh 10/04/2023, 9:57 AM

## 2023-10-04 NOTE — H&P (View-Only) (Signed)
GI Inpatient Follow-up Note  Subjective:  Patient seen in follow-up for melena/blood loss anemia. No acute events overnight. Hemoglobin 6.5 this morning. She reports 3-4 episodes of black stool yesterday during the day. No new complaints. She continues to feel weak.   Scheduled Inpatient Medications:   atorvastatin  40 mg Oral Daily   erythromycin  1 Application Both Eyes QHS   feeding supplement  1 Container Oral TID BM   insulin aspart  0-5 Units Subcutaneous QHS   insulin aspart  0-9 Units Subcutaneous TID WC   pantoprazole (PROTONIX) IV  40 mg Intravenous Q12H    Continuous Inpatient Infusions:    sodium chloride Stopped (10/03/23 1900)    PRN Inpatient Medications:  acetaminophen, hydrALAZINE, meclizine, ondansetron (ZOFRAN) IV, mouth rinse, temazepam  Review of Systems: Constitutional: Weight is stable.  Eyes: No changes in vision. ENT: No oral lesions, sore throat.  GI: see HPI.  Heme/Lymph: No easy bruising.  CV: No chest pain.  GU: No hematuria.  Integumentary: No rashes.  Neuro: No headaches.  Psych: No depression/anxiety.  Endocrine: No heat/cold intolerance.  Allergic/Immunologic: No urticaria.  Resp: No cough, SOB.  Musculoskeletal: No joint swelling.    Physical Examination: BP 133/70 (BP Location: Left Arm)   Pulse 96   Temp 98.7 F (37.1 C)   Resp 17   Ht 5\' 2"  (1.575 m)   Wt 58.5 kg   SpO2 98%   BMI 23.59 kg/m  Gen: NAD, alert and oriented x 4 HEENT: PEERLA, EOMI, Neck: supple, no JVD or thyromegaly Chest: CTA bilaterally, no wheezes, crackles, or other adventitious sounds CV: RRR, no m/g/c/r Abd: soft, NT, ND, +BS in all four quadrants; no HSM, guarding, ridigity, or rebound tenderness Ext: no edema, well perfused with 2+ pulses, Skin: no rash or lesions noted Lymph: no LAD  Data: Lab Results  Component Value Date   WBC 8.0 10/04/2023   HGB 6.6 (L) 10/04/2023   HCT 20.2 (L) 10/04/2023   MCV 90.2 10/04/2023   PLT 231 10/04/2023    Recent Labs  Lab 10/03/23 1725 10/03/23 2335 10/04/23 0502  HGB 7.5* 6.5* 6.6*   Lab Results  Component Value Date   NA 137 10/04/2023   K 3.5 10/04/2023   CL 104 10/04/2023   CO2 26 10/04/2023   BUN 25 (H) 10/04/2023   CREATININE 0.82 10/04/2023   GLU 133 04/16/2015   Lab Results  Component Value Date   ALT 14 10/02/2023   AST 17 10/02/2023   ALKPHOS 86 10/02/2023   BILITOT 0.2 10/02/2023   Recent Labs  Lab 10/03/23 0127  APTT 27  INR 1.3*   EGD 10/03/2023: - esophageal plaques found suspicious for candidiasis - normal stomach - normal duodenum  Assessment/Plan:  89 y/o Caucasian female with a PMH of HTN, HLD, BPPV, T2DM, hx of colon cancer s/p partial colectomy, hx of non-small cell lung cancer, bilateral carotid artery disease, dCHF, pSVT insomnia, IBS-D, and GERD presented to the [facility] ED 11/16 for chief complaint of progressive fatigue, generalized weakness, and melena. She is s/p EGD yesterday which was negative.   Melena  Anemia 2/2 chronic blood loss  Hx of colon cancer s/p right hemicolectomy by Dr. Tonna Boehringer 06/21/23  Esophageal candidiasis - KOH Prep + 10/03/23  Chronic dCHF  Recent dx of pulmonary nodule probable bronchogenic carcinoma versus metastatic lesions - work-up ongoing  Recommendations:  - Plan on colonoscopy tomorrow with Dr. Norma Fredrickson  - Transfuse 1 unit pRBC today - Continue to  monitor serial H&H. Transfuse for Hgb <7.0.  - Continue to monitor for signs of bleeding - Continue PPI for gastric protection  - Continue supportive care per primary team - Start fluconazole for treatment of esophageal candidiasis - Clear liquid diet today. Bowel prep later today. NPO after midnight.  - See procedure note for findings and further recs  I reviewed the risks (including bleeding, perforation, infection, anesthesia complications, cardiac/respiratory complications), benefits and alternatives of colonoscopy . Patient consents to proceed.    Please  call with questions or concerns.    Jacob Moores, PA-C [facility] [phone]

## 2023-10-04 NOTE — Progress Notes (Signed)
Progress Note   Patient: [name] ZOX:[id number] DOB: [birth year] DOA: 10/02/2023     2 DOS: the patient was seen and examined on 10/04/2023   Brief hospital course:  [name] is a 89 y.o. female with medical history significant of colon cancer (s/p of partial colectomy), hypertension, hyperlipidemia, DM, diastolic CHF, stroke, GERD, bilateral carotid artery stenosis, diverticulosis, PSVT, IBS, who presented on 10/02/2023 for evaluation of dark stools x several episodes over ~11 days, progressive fatigue and generalized weakness.  Pt was found anemic with Hbg 9.1, down from 11.2 just two months ago. Pt was admitted for further evaluation and management with Gastroenterology consulted.  Further hospital course and management as outlined below.    Assessment and Plan:  GI bleeding and acute blood loss anemia:  Melena Hgb on admission 9.1 down from 11.2 in Sept.  11/18 -- Hbg this AM 6.5 >> 6.6 --Transfuse 1 unit pRBC's today --Repeat H&H post-transfusion --Trend Hbg and transfuse if < 7 or < 8 with signs of active bleeding --GI is consulted --EGD done 11/17 -- see Op Note --Bowel prep & Colonoscopy tomorrow --Holding aspirin --Diet per GI -- on clears today and NPO after midnight --Maintenance IV fluids until taking adequate PO --IV Protonix BID --Zofran IV PRN nausea --Avoid NSAIDs and SQ heparin  Generalized weakness -- due to above --PT/OT evaluations --Fall precautions   Chronic diastolic CHF  - stable 2D echo on 08/12/2023 showed EF of 60-85% with grade 1 diastolic dysfunction.   --Monitor volume status   Hypertension associated with diabetes (HCC) --IV hydralazine PRN --Holding home meds: amlodipine and HCTZ (due to risk of hypotension due to GI bleeding)   Hyperlipidemia associated with type 2 diabetes mellitus (HCC) --Lipitor   Type 2 diabetes mellitus with hyperglycemia, without long-term current use of insulin (HCC): Recent A1c 7.5, poorly  controlled.   --Hold home glipizide  --Sliding scale Novolog   History of CVA (cerebrovascular accident) --Continue Lipitor --Holding aspirin   Colon cancer  --S/p of partial colectomy   Lung nodule: Patient was recently found to have lung nodule.   Seen by Dr. Karna Christmas of pulmonology on 09/29/2023.   Appears being worked up for ?metastasized disease.   --Follow up as scheduled with Dr. Karna Christmas    Primary insomnia -- Continue home Restoril      Subjective: Pt seen this AM, daughter and daughter-in-law at bedside.  Pt continues to pass dark stools and Hbg below 7. Pt agreeable to blood transfusion.  Besides feeling very tired and weak, she denies acute complaints.    Physical Exam: Vitals:   10/04/23 0445 10/04/23 0840 10/04/23 1053 10/04/23 1120  BP: 133/70 124/67 (!) 109/58 (!) 108/57  Pulse: 96 93 90 89  Resp: 17 19 18 18   Temp: 98.7 F (37.1 C) 97.6 F (36.4 C) 98.3 F (36.8 C) 98.9 F (37.2 C)  TempSrc:   Oral Oral  SpO2: 98% 99% 97% 97%  Weight:      Height:       General exam: awake, appears fatigued, no acute distress HEENT: moist mucus membranes, hearing grossly normal  Respiratory system: CTAB, no wheezes, rales or rhonchi, normal respiratory effort. Cardiovascular system: normal S1/S2, RRR, 2/6 systolic murmur noted, no pedal edema.   Gastrointestinal system: soft, NT, ND Central nervous system: A&O x3. no gross focal neurologic deficits, normal speech Extremities: moves all, no edema, normal tone Skin: dry, intact, normal temperature Psychiatry: normal mood, congruent affect, judgement and insight appear normal  Data Reviewed:  Notable labs ---  Hbg - 9.1 >> 7.9 >> 8.3 >> 7.9 >> 7.5 >> 6.5 >> 6.6 BMP normal except glucose 184, Ca 8.1  Normal iron studies, folate, and B12  Family Communication: daughter and daughter-in-law were at bedside on rounds  Disposition: Status is: Inpatient Remains inpatient appropriate because: ongoing evaluation,  anemia requiring transfusion   Planned Discharge Destination: Home    Time spent: 38 minutes  Author: Pennie Banter, DO 10/04/2023 1:52 PM  For on call review [email or web address].

## 2023-10-05 ENCOUNTER — Encounter: Payer: Self-pay | Admitting: Internal Medicine

## 2023-10-05 ENCOUNTER — Encounter
Admission: EM | Disposition: A | Discharge status: Home Health | Payer: Self-pay | Source: Home / Self Care | Attending: Internal Medicine

## 2023-10-05 ENCOUNTER — Inpatient Hospital Stay: Payer: Medicare Other | Admitting: Anesthesiology

## 2023-10-05 DIAGNOSIS — R531 Weakness: Secondary | ICD-10-CM | POA: Diagnosis not present

## 2023-10-05 DIAGNOSIS — D62 Acute posthemorrhagic anemia: Secondary | ICD-10-CM | POA: Diagnosis not present

## 2023-10-05 HISTORY — PX: COLONOSCOPY: SHX5424

## 2023-10-05 LAB — TYPE AND SCREEN
ABO/RH(D): A NEG
Antibody Screen: NEGATIVE
Unit division: 0

## 2023-10-05 LAB — HEMOGLOBIN AND HEMATOCRIT, BLOOD
HCT: 25 % — ABNORMAL LOW (ref 36.0–46.0)
Hemoglobin: 8.2 g/dL — ABNORMAL LOW (ref 12.0–15.0)

## 2023-10-05 LAB — BPAM RBC
Blood Product Expiration Date: 202411282359
ISSUE DATE / TIME: 202411181058
Unit Type and Rh: 600

## 2023-10-05 LAB — CBC
HCT: 23.5 % — ABNORMAL LOW (ref 36.0–46.0)
Hemoglobin: 7.6 g/dL — ABNORMAL LOW (ref 12.0–15.0)
MCH: 28.4 pg (ref 26.0–34.0)
MCHC: 32.3 g/dL (ref 30.0–36.0)
MCV: 87.7 fL (ref 80.0–100.0)
Platelets: 210 10*3/uL (ref 150–400)
RBC: 2.68 MIL/uL — ABNORMAL LOW (ref 3.87–5.11)
RDW: 14.2 % (ref 11.5–15.5)
WBC: 7.1 10*3/uL (ref 4.0–10.5)
nRBC: 0 % (ref 0.0–0.2)

## 2023-10-05 LAB — GLUCOSE, CAPILLARY
Glucose-Capillary: 144 mg/dL — ABNORMAL HIGH (ref 70–99)
Glucose-Capillary: 205 mg/dL — ABNORMAL HIGH (ref 70–99)
Glucose-Capillary: 171 mg/dL — ABNORMAL HIGH (ref 70–99)

## 2023-10-05 SURGERY — COLONOSCOPY
Anesthesia: General

## 2023-10-05 MED ORDER — PROPOFOL 10 MG/ML IV BOLUS
INTRAVENOUS | Status: DC | PRN
  Administered 2023-10-05: 20 mg via INTRAVENOUS
  Administered 2023-10-05 (×3): 10 mg via INTRAVENOUS
  Administered 2023-10-05: 40 mg via INTRAVENOUS
  Administered 2023-10-05: 10 mg via INTRAVENOUS

## 2023-10-05 MED ORDER — STERILE WATER FOR IRRIGATION IR SOLN
Status: DC | PRN
  Administered 2023-10-05: 50 mL

## 2023-10-05 MED ORDER — SODIUM CHLORIDE 0.9 % IV SOLN: INTRAVENOUS | Status: DC | PRN

## 2023-10-05 MED ORDER — PHENYLEPHRINE 80 MCG/ML (10ML) SYRINGE FOR IV PUSH (FOR BLOOD PRESSURE SUPPORT)
PREFILLED_SYRINGE | INTRAVENOUS | Status: DC | PRN
  Administered 2023-10-05: 80 ug via INTRAVENOUS

## 2023-10-05 MED ORDER — PROPOFOL 500 MG/50ML IV EMUL
INTRAVENOUS | Status: DC | PRN
  Administered 2023-10-05: 125 ug/kg/min via INTRAVENOUS

## 2023-10-05 MED ORDER — LIDOCAINE HCL (CARDIAC) PF 100 MG/5ML IV SOSY
PREFILLED_SYRINGE | INTRAVENOUS | Status: DC | PRN
  Administered 2023-10-05: 40 mg via INTRAVENOUS

## 2023-10-05 NOTE — Anesthesia Procedure Notes (Signed)
Procedure Name: General with mask airway Date/Time: 10/05/2023 1:11 PM  Performed by: Mohammed Kindle, CRNAPre-anesthesia Checklist: Patient identified, Emergency Drugs available, Suction available and Patient being monitored Patient Re-evaluated:Patient Re-evaluated prior to induction Oxygen Delivery Method: Simple face mask Induction Type: IV induction Placement Confirmation: positive ETCO2, CO2 detector and breath sounds checked- equal and bilateral Dental Injury: Teeth and Oropharynx as per pre-operative assessment

## 2023-10-05 NOTE — Anesthesia Preprocedure Evaluation (Signed)
Anesthesia Evaluation  Patient identified by MRN, date of birth, ID band Patient awake    Reviewed: Allergy & Precautions, H&P , NPO status , Patient's Chart, lab work & pertinent test results, reviewed documented beta blocker date and time   History of Anesthesia Complications (+) PONV and history of anesthetic complications  Airway Mallampati: II   Neck ROM: full    Dental  (+) Poor Dentition   Pulmonary neg pulmonary ROS, former smoker   Pulmonary exam normal        Cardiovascular Exercise Tolerance: Poor hypertension, On Medications +CHF  Normal cardiovascular exam+ Valvular Problems/Murmurs  Rhythm:regular Rate:Normal     Neuro/Psych CVA  negative psych ROS   GI/Hepatic Neg liver ROS,GERD  Medicated,,  Endo/Other  negative endocrine ROSdiabetes    Renal/GU negative Renal ROS  negative genitourinary   Musculoskeletal   Abdominal   Peds  Hematology  (+) Blood dyscrasia, anemia   Anesthesia Other Findings Past Medical History: 08/05/2015: Acid reflux 02/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)     Comment:  a.) Bx (+) for invasive moderately invasive               adenocarcinoma No date: Anemia No date: Aortic atherosclerosis (HCC) No date: Aortic stenosis     Comment:  a.) TTE 05/17/2017: mild-mod AS (MPG 21.3); b.) TTE               06/17/2018: mild-mod AS (MPG 25.6/ AVA = 1.3 cm^2); c.)               TTE 12/11/2019: mod AS (MPG 19.3/ AVA = 0.90 cm^2); d.)               TTE 01/27/2021: mod AS (MPG 21.9/ AVA = 0.99 cm^2) 03/09/2023: Ascending aorta dilatation (HCC)     Comment:  a.) CT chest 03/09/2023: fusiform dilatation measuring               4.3 cm No date: Basal cell carcinoma (BCC) of left medial cheek     Comment:  a.) s/p Mohs surgery 07/13/2022 at [facility] No date: Bilateral carotid artery disease (HCC)     Comment:  a.) doppler 04/03/2007: 50-75% RICA; b.) s/p LEFT CEA               with  CorMatrix patch angioplasty 01/18/2015; c.) doppler               11/02/2016: 40-59% RICA; d.) doppler 01/10/2018: 1-39%               RICA 08/05/2015: Diverticulosis 06/10/2021: Eczema of both upper extremities No date: Heart failure with preserved left ventricular function  (HFpEF) (HCC)     Comment:  a.) TTE 05/17/2017: EF >55%, mild LVH, mild LAE, mild               panval regurg; b.) TTE 06/27/2018 : EF >55%, mod LVH,               mild LAE, mild panval regurg'; G1dd; c.) TTE 12/07/2019:               EF >55%, mild LVH, mild MAC, mild panval regurg; d.) TTE               01/27/2021: EF >55%, mod LVH, triv TR, mild AR/MR, G1DD No date: Heart murmur No date: History of right cataract extraction No date: Hyperlipidemia No date: Hypertension No date: Insomnia     Comment:  a.) on  BZO (temazepam) PRN 04/20/2017: Irritable bowel syndrome with diarrhea 08/05/2015: LBP (low back pain) No date: Melanoma (HCC)     Comment:  a.) s/p Mohs surgery 02/06/2020 - LEFT medial brow --> V              to Y advancement flap No date: Melena 03/09/2023: Non-small cell lung cancer (NSCLC) (HCC)     Comment:  a.) CT chest 03/09/2023 --> superior segment LLL --> 2.1              x 1.7 x 2.1 cm mixed ground-glass and subpleural nodule               extending to pleura with irreg margins --> concerning for              primary bronchogenic carcinoma rather than metastatic               disease; b.) 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               (60 Gy over 6 fractions) No date: OAB (overactive bladder) No date: PONV (postoperative nausea and vomiting)     Comment:  a.) single episode following cataract surgery 02/22/2015: PSVT (paroxysmal supraventricular tachycardia) (HCC)     Comment:  a.) holter 02/22/2015: 6 runs with longest lasting 10               beats 04/02/2007: Right pontine stroke [facility])     Comment:  a.) MRI brain 04/02/2007 - subacute RIGHT pontine CVA 12/16/2014: Stroke  [facility])     Comment:  a.) MRI brain 12/16/2014: punctate foci of restricted               diffusion x 5 consistent with microembolic infarctions               affecting the RIGHT lateral medulla, LEFT temporal lobe,               2 foci in the RIGHT thalamus, and LEFT inferior  frontal               lobe/basal ganglia --> distribution would suggest cardiac              or ascending aortic source No date: T2DM (type 2 diabetes mellitus) (HCC) No date: Tubular adenoma of colon Past Surgical History: No date: APPENDECTOMY No date: BREAST BIOPSY; Right     Comment:  neg No date: BREAST SURGERY     Comment:  1980's 01/18/2015: CAROTID ENDARTERECTOMY; Left     Comment:  Procedure: CAROTID ENDARTERECTOMY; Location: [facility];               Surgeon: [name], MD 2012: CATARACT EXTRACTION; Right 06/27/2012: CHOLECYSTECTOMY 02/17/2023: COLONOSCOPY WITH PROPOFOL; N/A     Comment:  Procedure: COLONOSCOPY WITH PROPOFOL;  Surgeon:               Regis Bill, MD;  Location: [facility] ENDOSCOPY;                Service: Endoscopy;  Laterality: N/A; 1950: DECOMPRESSIVE LUMBAR LAMINECTOMY LEVEL 1 10/03/2023: ESOPHAGEAL BRUSHING     Comment:  Procedure: ESOPHAGEAL BRUSHING;  Surgeon: Norma Fredrickson,               Boykin Nearing, MD;  Location: [facility] ENDOSCOPY;  Service:               Gastroenterology;; 10/03/2023: ESOPHAGOGASTRODUODENOSCOPY; N/A     Comment:  Procedure:  ESOPHAGOGASTRODUODENOSCOPY (EGD);  Surgeon:               Toledo, Boykin Nearing, MD;  Location: [facility] ENDOSCOPY;                Service: Gastroenterology;  Laterality: N/A; 02/16/2023: ESOPHAGOGASTRODUODENOSCOPY (EGD) WITH PROPOFOL; N/A     Comment:  Procedure: ESOPHAGOGASTRODUODENOSCOPY (EGD) WITH               PROPOFOL;  Surgeon: Regis Bill, MD;  Location:               [facility] ENDOSCOPY;  Service: Endoscopy;  Laterality: N/A; 02/06/2020: MELANOMA EXCISION; Left     Comment:  Procedure: MOHS SURGERY WITH V TO Y ADVANCEMENT FLAP                (LEFT MEDIAL EYEBROW); Location: [facility] 07/13/2022: MOHS SURGERY; Left     Comment:  Procedure: MOHS SURGERY (BCC LEFT MEDIAL CHEEK);               Location: [facility] BMI    Body Mass Index: 23.59 kg/m     Reproductive/Obstetrics negative OB ROS                             Anesthesia Physical Anesthesia Plan  ASA: 3 and emergent  Anesthesia Plan: General   Post-op Pain Management:    Induction:   PONV Risk Score and Plan:   Airway Management Planned:   Additional Equipment:   Intra-op Plan:   Post-operative Plan:   Informed Consent: I have reviewed the patients History and Physical, chart, labs and discussed the procedure including the risks, benefits and alternatives for the proposed anesthesia with the patient or authorized representative who has indicated his/her understanding and acceptance.     Dental Advisory Given  Plan Discussed with: CRNA  Anesthesia Plan Comments:        Anesthesia Quick Evaluation

## 2023-10-05 NOTE — Care Management Important Message (Signed)
Important Message  Patient Details  Name: [name] MRN: [id number] Date of Birth: 04-09-1929   Important Message Given:  Yes - Medicare IM     Charnita Trudel, Stephan Minister 10/05/2023, 10:29 AM

## 2023-10-05 NOTE — TOC Initial Note (Signed)
Transition of Care [employer]) - Initial/Assessment Note    Patient Details  Name: [name] MRN: [id number] Date of Birth: 1929-11-04  Transition of Care [facility] CM/SW Contact:    Allena Katz, LCSW Phone Number: 10/05/2023, 12:13 PM  Clinical Narrative:  Pt admitted with GI bleeding. Pt lives alone. Pt has support from daughter. Pt has RW from [employer]. Pt scheduled for Colonoscopy today. PT unable to see. TOC will follow up once PT has been able to assess.                  Expected Discharge Plan: Home w Home Health Services Barriers to Discharge: Continued Medical Work up   Patient Goals and CMS Choice            Expected Discharge Plan and Services                         DME Arranged: Walker rolling DME Agency: [employer]                  Prior Living Arrangements/Services   Lives with:: Self Patient language and need for interpreter reviewed:: Yes Do you feel safe going back to the place where you live?: Yes        Care giver support system in place?: Yes (comment) Current home services: DME    Activities of Daily Living   ADL Screening (condition at time of admission) Independently performs ADLs?: Yes (appropriate for developmental age) Is the patient deaf or have difficulty hearing?: No Does the patient have difficulty seeing, even when wearing glasses/contacts?: No Does the patient have difficulty concentrating, remembering, or making decisions?: No  Permission Sought/Granted                  Emotional Assessment       Orientation: : Oriented to Self, Oriented to Place, Oriented to  Time, Oriented to Situation      Admission diagnosis:  Melena [K92.1] GI bleeding [K92.2] Gastrointestinal hemorrhage, unspecified gastrointestinal hemorrhage type [K92.2] Patient Active Problem List   Diagnosis Date Noted   GI bleeding 10/02/2023   Lung nodule 10/02/2023   Dizziness 07/21/2023   Stroke-like symptom 07/20/2023   Encounter for  subsequent annual wellness visit (AWV) in Medicare patient 07/13/2023   Hypertension associated with diabetes (HCC) 07/13/2023   Hyperlipidemia associated with type 2 diabetes mellitus (HCC) 07/13/2023   Type 2 diabetes mellitus with hyperglycemia, without long-term current use of insulin (HCC) 07/13/2023   Primary insomnia 07/13/2023   Colon cancer (HCC) 06/21/2023   Malignant neoplasm of colon (HCC) 02/24/2023   Melena 02/15/2023   Acute blood loss anemia 02/15/2023   Chronic diastolic CHF (congestive heart failure) (HCC) 02/15/2023   Annual physical exam 07/06/2022   Moderate aortic stenosis 08/05/2015   History of CVA (cerebrovascular accident) 08/05/2015   OP (osteoporosis) 08/05/2015   Superficial thrombophlebitis 08/05/2015   Generalized weakness 08/05/2015   PCP:  Jacky Kindle, FNP Pharmacy:   [facility] [address], [place] - [address] [place] [address] Phone: [phone] Fax: [phone]     Social Determinants of Health (SDOH) Social History: SDOH Screenings   Food Insecurity: No Food Insecurity (10/03/2023)  Housing: Low Risk  (10/03/2023)  Transportation Needs: No Transportation Needs (10/03/2023)  Utilities: Not At Risk (10/03/2023)  Alcohol Screen: Low Risk  (05/09/2020)  Depression (PHQ2-9): Low Risk  (02/24/2023)  Financial Resource Strain: Low Risk  (05/09/2020)  Physical Activity:  Inactive (05/09/2020)  Social Connections: Moderately Isolated (05/09/2020)  Stress: No Stress Concern Present (05/09/2020)  Tobacco Use: Medium Risk (10/05/2023)   SDOH Interventions:     Readmission Risk Interventions     No data to display

## 2023-10-05 NOTE — Interval H&P Note (Signed)
History and Physical Interval Note:  10/05/2023 12:15 PM  [name]  has presented today for surgery, with the diagnosis of Melena, GI blood loss anemia.  The various methods of treatment have been discussed with the patient and family. After consideration of risks, benefits and other options for treatment, the patient has consented to  Procedure(s): COLONOSCOPY (N/A) as a surgical intervention.  The patient's history has been reviewed, patient examined, no change in status, stable for surgery.  I have reviewed the patient's chart and labs.  Questions were answered to the patient's satisfaction.     [place], [place]

## 2023-10-05 NOTE — Evaluation (Signed)
Physical Therapy Evaluation Patient Details Name: [name] MRN: [id number] DOB: 1929/09/17 Today's Date: 10/05/2023  History of Present Illness  Pt is a 89 yo female presenting for evaluation of dark stools x several episodes over ~11 days, anemia, progressive fatigue and generalized weakness. PMH includes colon cancer (s/p of partial colectomy), hypertension, hyperlipidemia, DM, diastolic CHF, stroke, GERD, bilateral carotid artery stenosis, diverticulosis, PSVT, IBS.   Clinical Impression  Pt alert and oriented, denies pain, and receptive to therapy session before her colonoscopy later today. Upon entry, pt in sidelying and about to perform transfer to [facility] with daughter-in-law 2/2 increased urinary frequency. Pt required supervision to come to sitting EOB and minA for squat pivot transfer to/from [facility] without AD. Per pt and daughter-in-law report, pt lives alone, with no steps, but has multiple family members that live nearby and will be able to provide 24/7 support at home. Pt was independent with all mobility prior to recent hospitalization and required some assistance with ADLs at baseline. Pt performed STS and ambulation with HHA (daughter-in-law provided assistance with IV pole), noted instability and balance impairments as pt walked with narrow BOS and increased postural sway. Educated pt on using RW in the hospital for all transfers; pt receptive. Pt left with all needs met and RN approved of leaving bed alarm off for ease of toilet transfers with daughter-in-law prior to procedure. Pt would benefit from continued skilled therapy to address balance/weakness deficits and progress toward mobility goals.      If plan is discharge home, recommend the following: A little help with walking and/or transfers;A little help with bathing/dressing/bathroom;Assistance with feeding;Direct supervision/assist for financial management;Assist for transportation;Assistance with cooking/housework   Can  travel by private vehicle    yes    Equipment Recommendations BSC/3in1  Recommendations for Other Services       Functional Status Assessment Patient has had a recent decline in their functional status and demonstrates the ability to make significant improvements in function in a reasonable and predictable amount of time.     Precautions / Restrictions Precautions Precautions: Fall Restrictions Weight Bearing Restrictions: No      Mobility  Bed Mobility Overal bed mobility: Needs Assistance Bed Mobility: Sidelying to Sit   Sidelying to sit: Supervision            Transfers Overall transfer level: Needs assistance Equipment used: None Transfers: Sit to/from Stand, Bed to chair/wheelchair/BSC Sit to Stand: Min assist     Squat pivot transfers: Min assist     General transfer comment: pt performing bed to [facility] transfer upon entry with daughter-in-law; minA for safety and management of IV line    Ambulation/Gait Ambulation/Gait assistance: Min assist Gait Distance (Feet): 20 Feet Assistive device: 1 person hand held assist Gait Pattern/deviations: Decreased step length - right, Decreased step length - left, Staggering left, Staggering right, Narrow base of support Gait velocity: decreased     General Gait Details: pt amb without AD per PLOF; minA with HHA 2/2 significant unsteadiness (duaghter in law helping to manage IV pole for safety)  Stairs            Wheelchair Mobility     Tilt Bed    Modified Rankin (Stroke Patients Only)       Balance Overall balance assessment: Needs assistance Sitting-balance support: Bilateral upper extremity supported, Feet supported Sitting balance-Leahy Scale: Good     Standing balance support: Single extremity supported, During functional activity Standing balance-Leahy Scale: Poor Standing balance comment: pt very  unsteady upon standing without AD                             Pertinent  Vitals/Pain Pain Assessment Pain Assessment: No/denies pain    Home Living Family/patient expects to be discharged to:: Private residence Living Arrangements: Alone Available Help at Discharge: Family;Available 24 hours/day (family lives next door) Type of Home: House Home Access: Level entry       Home Layout: One level Home Equipment: Agricultural consultant (2 wheels);Cane - single point;Grab bars - tub/shower;Grab bars - toilet      Prior Function Prior Level of Function : Driving;Needs assist             Mobility Comments: Patient reports IND without use of AD. ADLs Comments: assist with ADLs     Extremity/Trunk Assessment   Upper Extremity Assessment Upper Extremity Assessment: Overall WFL for tasks assessed    Lower Extremity Assessment Lower Extremity Assessment: Overall WFL for tasks assessed    Cervical / Trunk Assessment Cervical / Trunk Assessment: Normal  Communication   Communication Communication: No apparent difficulties Cueing Techniques: Verbal cues  Cognition Arousal: Alert Behavior During Therapy: WFL for tasks assessed/performed Overall Cognitive Status: Within Functional Limits for tasks assessed                                          General Comments      Exercises     Assessment/Plan    PT Assessment Patient needs continued PT services  PT Problem List Decreased mobility;Decreased safety awareness;Decreased range of motion;Decreased activity tolerance;Decreased balance;Decreased knowledge of use of DME       PT Treatment Interventions DME instruction;Gait training;Balance training;Therapeutic activities;Patient/family education;Functional mobility training    PT Goals (Current goals can be found in the Care Plan section)  Acute Rehab PT Goals Patient Stated Goal: to get back to normal PT Goal Formulation: With patient Time For Goal Achievement: 10/19/23 Potential to Achieve Goals: Good    Frequency Min 1X/week      Co-evaluation               AM-PAC PT "6 Clicks" Mobility  Outcome Measure Help needed turning from your back to your side while in a flat bed without using bedrails?: A Little Help needed moving from lying on your back to sitting on the side of a flat bed without using bedrails?: A Little Help needed moving to and from a bed to a chair (including a wheelchair)?: A Little Help needed standing up from a chair using your arms (e.g., wheelchair or bedside chair)?: A Little Help needed to walk in hospital room?: A Little Help needed climbing 3-5 steps with a railing? : A Little 6 Click Score: 18    End of Session Equipment Utilized During Treatment: Gait belt Activity Tolerance: Patient tolerated treatment well;No increased pain Patient left: in bed;with family/visitor present;with call bell/phone within reach (bed alarm not set- RN aware) Nurse Communication: Mobility status;Other (comment) (bed alarm status) PT Visit Diagnosis: Unsteadiness on feet (R26.81);Other abnormalities of gait and mobility (R26.89)    Time: 7829-5621 PT Time Calculation (min) (ACUTE ONLY): 17 min   Charges:   PT Evaluation $PT Eval Low Complexity: 1 Low PT Treatments $Therapeutic Activity: 8-22 mins PT General Charges $$ ACUTE PT VISIT: 1 Visit  Shauna Hugh, SPT 10/05/2023, 3:05 PM

## 2023-10-05 NOTE — Op Note (Signed)
[facility] Patient Name: [name] Procedure Date: 10/05/2023 1:04 PM MRN: [id number] Account #: [id number] Date of Birth: 30-Oct-1929 Admit Type: Inpatient Age: 89 Room: [facility] ENDO ROOM 3 Gender: Female Note Status: Finalized Instrument Name: Prentice Docker [id number] Procedure:             Colonoscopy Indications:           Melena, Acute post hemorrhagic anemia Providers:             Boykin Nearing. Ahsley Attwood MD, MD Medicines:             Propofol per Anesthesia Complications:         No immediate complications. Estimated blood loss: None. Procedure:             Pre-Anesthesia Assessment:                        - The risks and benefits of the procedure and the                         sedation options and risks were discussed with the                         patient. All questions were answered and informed                         consent was obtained.                        - Patient identification and proposed procedure were                         verified prior to the procedure by the nurse. The                         procedure was verified in the procedure room.                        - ASA Grade Assessment: III - A patient with severe                         systemic disease.                        - After reviewing the risks and benefits, the patient                         was deemed in satisfactory condition to undergo the                         procedure.                        After obtaining informed consent, the colonoscope was                         passed under direct vision. Throughout the procedure,                         the patient's blood pressure, pulse, and oxygen  saturations were monitored continuously. The                         Colonoscope was introduced through the anus and                         advanced to the the ileocolonic anastomosis. The                         colonoscopy was performed without  difficulty. The                         patient tolerated the procedure well. The quality of                         the bowel preparation was adequate. Ileocolonic                         anastomosis and neoterminal ileum were photographed. Findings:      The perianal and digital rectal examinations were normal. Pertinent       negatives include normal sphincter tone and no palpable rectal lesions.      There was evidence of a prior end-to-side ileo-colonic anastomosis in       the ascending colon. This was patent and was characterized by healthy       appearing mucosa. The anastomosis was traversed.      The neo-terminal ileum appeared normal.      Many small-mouthed diverticula were found in the transverse colon and       left colon. There was no evidence of diverticular bleeding.      There is no endoscopic evidence of bleeding, mass or polyps in the       entire colon.      The retroflexed view of the distal rectum and anal verge was normal and       showed no anal or rectal abnormalities. Impression:            - Patent end-to-side ileo-colonic anastomosis,                         characterized by healthy appearing mucosa.                        - The examined portion of the ileum was normal.                        - Mild diverticulosis in the transverse colon and in                         the left colon. There was no evidence of diverticular                         bleeding.                        - The distal rectum and anal verge are normal on                         retroflexion view.                        -  No specimens collected.                        - I presume a possibly resolved diverticular bleed. Recommendation:        - Return patient to hospital ward for ongoing care.                        - Serial H/H, Serial exams                        - [employer] will sign off for now. Call us back if any                         changes clinically or if we can help for any  reason. Procedure Code(s):     --- Professional ---                        [phone], Colonoscopy, flexible; diagnostic, including                         collection of specimen(s) by brushing or washing, when                         performed (separate procedure) Diagnosis Code(s):     --- Professional ---                        K57.30, Diverticulosis of large intestine without                         perforation or abscess without bleeding                        D62, Acute posthemorrhagic anemia                        K92.1, Melena (includes Hematochezia)                        Z98.0, Intestinal bypass and anastomosis status CPT copyright 2022 American Medical Association. All rights reserved. The codes documented in this report are preliminary and upon coder review may  be revised to meet current compliance requirements. Stanton Kidney MD, MD 10/05/2023 1:33:27 PM This report has been signed electronically. Number of Addenda: 0 Note Initiated On: 10/05/2023 1:04 PM Scope Withdrawal Time: 0 hours 4 minutes 51 seconds  Total Procedure Duration: 0 hours 10 minutes 38 seconds  Estimated Blood Loss:  Estimated blood loss: none.      [facility]

## 2023-10-05 NOTE — Progress Notes (Signed)
Progress Note   Patient: [name]:[id number] DOB: [birth year] DOA: 10/02/2023     3 DOS: the patient was seen and examined on 10/05/2023   Brief hospital course:  [name] is a 89 y.o. female with medical history significant of colon cancer (s/p of partial colectomy), hypertension, hyperlipidemia, DM, diastolic CHF, stroke, GERD, bilateral carotid artery stenosis, diverticulosis, PSVT, IBS, who presented on 10/02/2023 for evaluation of dark stools x several episodes over ~11 days, progressive fatigue and generalized weakness.  Pt was found anemic with Hbg 9.1, down from 11.2 just two months ago. Pt was admitted for further evaluation and management with Gastroenterology consulted.  Further hospital course and management as outlined below.    Assessment and Plan:  GI bleeding and acute blood loss anemia:  Melena - likely Diverticular bleeding -- appears has resolved. Hgb on admission 9.1 down from 11.2 in Sept.  11/18 -- Hbg this AM 6.5 >> 6.6 11/19 -- Hbg improved to 8.9 post-transfusion >> 7.6 this AM >> 8.2 this afternoon --Repeat CBC in AM --Trend Hbg and transfuse if < 7 or < 8 with signs of active bleeding --GI is consulted --EGD done 11/17 -- see Op Note --Colonoscopy today -- non-bleeding diverticulosis, prior colon cancer resection with anastomosis site healthy appearing --Holding aspirin --Diet resumed --Stop IV PPI --Zofran IV PRN nausea --Avoid NSAIDs and SQ heparin --Avoid constipation - recommend daily stool softeners and adequate hydration  Generalized weakness -- due to above --PT/OT evaluations --Fall precautions   Chronic diastolic CHF  - stable 2D echo on 08/12/2023 showed EF of 60-85% with grade 1 diastolic dysfunction.   --Monitor volume status   Hypertension associated with diabetes (HCC) --IV hydralazine PRN --Holding home meds: amlodipine and HCTZ (due to risk of hypotension due to GI bleeding)   Hyperlipidemia associated with type 2  diabetes mellitus (HCC) --Lipitor   Type 2 diabetes mellitus with hyperglycemia, without long-term current use of insulin (HCC): Recent A1c 7.5, poorly controlled.   --Hold home glipizide  --Sliding scale Novolog   History of CVA (cerebrovascular accident) --Continue Lipitor --Holding aspirin   Colon cancer  --S/p of partial colectomy   Lung nodule: Patient was recently found to have lung nodule.   Seen by Dr. Karna Christmas of pulmonology on 09/29/2023.   Appears being worked up for ?metastasized disease.   --Follow up as scheduled with Dr. Karna Christmas    Primary insomnia -- Continue home Restoril      Subjective: Pt seen after colonoscopy today with multiple family members present. Pt reports ongoing fatigue but otherwise feels okay.  No signs of bleeding with bowel prep overnight and none today so far.    Physical Exam: Vitals:   10/05/23 1330 10/05/23 1344 10/05/23 1352 10/05/23 1428  BP: (!) 92/49 (!) 104/53 (!) 104/56 (!) 118/50  Pulse: 72 76 76 85  Resp: 20 (!) 21 (!) 23   Temp: (!) 97.1 F (36.2 C)   98.4 F (36.9 C)  TempSrc: Temporal   Oral  SpO2: 100% 98% 98% 99%  Weight:      Height:       General exam: awake, appears fatigued, no acute distress HEENT: moist mucus membranes, hearing grossly normal  Respiratory system: CTAB, no wheezes, rales or rhonchi, normal respiratory effort. Cardiovascular system: normal S1/S2, RRR, 2/6 systolic murmur noted, no pedal edema.   Gastrointestinal system: soft, NT, ND Central nervous system: A&O x3. no gross focal neurologic deficits, normal speech Extremities: moves all, no edema, normal  tone Skin: dry, intact, normal temperature Psychiatry: normal mood, congruent affect, judgement and insight appear normal   Data Reviewed:  Notable labs ---  Hbg - 9.1 >> 7.9 >> 8.3 >> 7.9 >> 7.5 >> 6.5 >> 6.6 (transfused 1 unit RBCs) >> 8.9 >> 7.6 >> 8.2   Normal iron studies, folate, and B12   Family Communication: 3 family  members were at bedside on rounds  Disposition: Status is: Inpatient Remains inpatient appropriate because: closely monitoring anemia for further improvement. Anticipate d/c in 24-48 hours if anemia improving and no further bleeding.   Planned Discharge Destination: Home    Time spent: 38 minutes  Author: Pennie Banter, DO 10/05/2023 3:17 PM  For on call review [email or web address].

## 2023-10-05 NOTE — Transfer of Care (Signed)
Immediate Anesthesia Transfer of Care Note  Patient: [name]  Procedure(s) Performed: COLONOSCOPY  Patient Location: Endoscopy Unit  Anesthesia Type:General  Level of Consciousness: drowsy and patient cooperative  Airway & Oxygen Therapy: Patient Spontanous Breathing and Patient connected to face mask oxygen  Post-op Assessment: Report given to RN and Post -op Vital signs reviewed and stable  Post vital signs: Reviewed and stable  Last Vitals:  Vitals Value Taken Time  BP 92/49 10/05/23 1330  Temp 36.2 C 10/05/23 1330  Pulse 70 10/05/23 1333  Resp 19 10/05/23 1333  SpO2 100 % 10/05/23 1333  Vitals shown include unfiled device data.  Last Pain:  Vitals:   10/05/23 1330  TempSrc: Temporal  PainSc: Asleep         Complications: No notable events documented.

## 2023-10-05 NOTE — Plan of Care (Signed)
Problem: Education: Goal: Ability to describe self-care measures that may prevent or decrease complications (Diabetes Survival Skills Education) will improve 10/05/2023 2325 by Eulis Manly, RN Outcome: Progressing 10/05/2023 2325 by Eulis Manly, RN Outcome: Progressing Goal: Individualized Educational Video(s) 10/05/2023 2325 by Eulis Manly, RN Outcome: Progressing 10/05/2023 2325 by Eulis Manly, RN Outcome: Progressing   Problem: Coping: Goal: Ability to adjust to condition or change in health will improve 10/05/2023 2325 by Eulis Manly, RN Outcome: Progressing 10/05/2023 2325 by Eulis Manly, RN Outcome: Progressing   Problem: Fluid Volume: Goal: Ability to maintain a balanced intake and output will improve 10/05/2023 2325 by Eulis Manly, RN Outcome: Progressing 10/05/2023 2325 by Eulis Manly, RN Outcome: Progressing   Problem: Health Behavior/Discharge Planning: Goal: Ability to identify and utilize available resources and services will improve 10/05/2023 2325 by Eulis Manly, RN Outcome: Progressing 10/05/2023 2325 by Eulis Manly, RN Outcome: Progressing Goal: Ability to manage health-related needs will improve 10/05/2023 2325 by Eulis Manly, RN Outcome: Progressing 10/05/2023 2325 by Eulis Manly, RN Outcome: Progressing   Problem: Metabolic: Goal: Ability to maintain appropriate glucose levels will improve 10/05/2023 2325 by Eulis Manly, RN Outcome: Progressing 10/05/2023 2325 by Eulis Manly, RN Outcome: Progressing   Problem: Nutritional: Goal: Maintenance of adequate nutrition will improve 10/05/2023 2325 by Eulis Manly, RN Outcome: Progressing 10/05/2023 2325 by Eulis Manly, RN Outcome: Progressing Goal: Progress toward achieving an optimal weight will improve 10/05/2023 2325 by Eulis Manly, RN Outcome: Progressing 10/05/2023 2325 by Eulis Manly, RN Outcome:  Progressing   Problem: Skin Integrity: Goal: Risk for impaired skin integrity will decrease 10/05/2023 2325 by Eulis Manly, RN Outcome: Progressing 10/05/2023 2325 by Merleen Milliner A, RN Outcome: Progressing   Problem: Tissue Perfusion: Goal: Adequacy of tissue perfusion will improve 10/05/2023 2325 by Eulis Manly, RN Outcome: Progressing 10/05/2023 2325 by Eulis Manly, RN Outcome: Progressing   Problem: Education: Goal: Knowledge of General Education information will improve Description: Including pain rating scale, medication(s)/side effects and non-pharmacologic comfort measures 10/05/2023 2325 by Eulis Manly, RN Outcome: Progressing 10/05/2023 2325 by Eulis Manly, RN Outcome: Progressing   Problem: Health Behavior/Discharge Planning: Goal: Ability to manage health-related needs will improve 10/05/2023 2325 by Eulis Manly, RN Outcome: Progressing 10/05/2023 2325 by Eulis Manly, RN Outcome: Progressing   Problem: Clinical Measurements: Goal: Ability to maintain clinical measurements within normal limits will improve 10/05/2023 2325 by Eulis Manly, RN Outcome: Progressing 10/05/2023 2325 by Eulis Manly, RN Outcome: Progressing Goal: Will remain free from infection 10/05/2023 2325 by Eulis Manly, RN Outcome: Progressing 10/05/2023 2325 by Eulis Manly, RN Outcome: Progressing Goal: Diagnostic test results will improve 10/05/2023 2325 by Eulis Manly, RN Outcome: Progressing 10/05/2023 2325 by Eulis Manly, RN Outcome: Progressing Goal: Respiratory complications will improve 10/05/2023 2325 by Eulis Manly, RN Outcome: Progressing 10/05/2023 2325 by Eulis Manly, RN Outcome: Progressing Goal: Cardiovascular complication will be avoided 10/05/2023 2325 by Eulis Manly, RN Outcome: Progressing 10/05/2023 2325 by Eulis Manly, RN Outcome: Progressing   Problem: Activity: Goal: Risk  for activity intolerance will decrease 10/05/2023 2325 by Eulis Manly, RN Outcome: Progressing 10/05/2023 2325 by Eulis Manly, RN Outcome: Progressing   Problem: Nutrition: Goal: Adequate nutrition will be maintained 10/05/2023 2325 by Eulis Manly, RN Outcome: Progressing 10/05/2023 2325 by Eulis Manly, RN Outcome: Progressing  Problem: Coping: Goal: Level of anxiety will decrease 10/05/2023 2325 by Eulis Manly, RN Outcome: Progressing 10/05/2023 2325 by Merleen Milliner A, RN Outcome: Progressing   Problem: Elimination: Goal: Will not experience complications related to bowel motility 10/05/2023 2325 by Eulis Manly, RN Outcome: Progressing 10/05/2023 2325 by Eulis Manly, RN Outcome: Progressing Goal: Will not experience complications related to urinary retention 10/05/2023 2325 by Eulis Manly, RN Outcome: Progressing 10/05/2023 2325 by Eulis Manly, RN Outcome: Progressing   Problem: Pain Management: Goal: General experience of comfort will improve 10/05/2023 2325 by Eulis Manly, RN Outcome: Progressing 10/05/2023 2325 by Merleen Milliner A, RN Outcome: Progressing   Problem: Safety: Goal: Ability to remain free from injury will improve 10/05/2023 2325 by Eulis Manly, RN Outcome: Progressing 10/05/2023 2325 by Eulis Manly, RN Outcome: Progressing   Problem: Skin Integrity: Goal: Risk for impaired skin integrity will decrease 10/05/2023 2325 by Eulis Manly, RN Outcome: Progressing 10/05/2023 2325 by Eulis Manly, RN Outcome: Progressing   Problem: Education: Goal: Individualized Educational Video(s) 10/05/2023 2325 by Eulis Manly, RN Outcome: Progressing 10/05/2023 2325 by Eulis Manly, RN Outcome: Progressing   Problem: Coping: Goal: Ability to adjust to condition or change in health will improve 10/05/2023 2325 by Eulis Manly, RN Outcome: Progressing 10/05/2023 2325 by  Eulis Manly, RN Outcome: Progressing   Problem: Fluid Volume: Goal: Ability to maintain a balanced intake and output will improve 10/05/2023 2325 by Eulis Manly, RN Outcome: Progressing 10/05/2023 2325 by Eulis Manly, RN Outcome: Progressing   Problem: Health Behavior/Discharge Planning: Goal: Ability to manage health-related needs will improve 10/05/2023 2325 by Eulis Manly, RN Outcome: Progressing 10/05/2023 2325 by Eulis Manly, RN Outcome: Progressing   Problem: Skin Integrity: Goal: Risk for impaired skin integrity will decrease 10/05/2023 2325 by Eulis Manly, RN Outcome: Progressing 10/05/2023 2325 by Eulis Manly, RN Outcome: Progressing   Problem: Health Behavior/Discharge Planning: Goal: Ability to manage health-related needs will improve 10/05/2023 2325 by Eulis Manly, RN Outcome: Progressing 10/05/2023 2325 by Eulis Manly, RN Outcome: Progressing   Problem: Clinical Measurements: Goal: Respiratory complications will improve 10/05/2023 2325 by Eulis Manly, RN Outcome: Progressing 10/05/2023 2325 by Eulis Manly, RN Outcome: Progressing

## 2023-10-06 ENCOUNTER — Encounter: Payer: Self-pay | Admitting: Internal Medicine

## 2023-10-06 ENCOUNTER — Ambulatory Visit: Payer: Medicare Other

## 2023-10-06 DIAGNOSIS — K5791 Diverticulosis of intestine, part unspecified, without perforation or abscess with bleeding: Secondary | ICD-10-CM | POA: Diagnosis not present

## 2023-10-06 LAB — CBC
HCT: 22.9 % — ABNORMAL LOW (ref 36.0–46.0)
Hemoglobin: 7.5 g/dL — ABNORMAL LOW (ref 12.0–15.0)
MCH: 28.8 pg (ref 26.0–34.0)
MCHC: 32.8 g/dL (ref 30.0–36.0)
MCV: 88.1 fL (ref 80.0–100.0)
Platelets: 237 10*3/uL (ref 150–400)
RBC: 2.6 MIL/uL — ABNORMAL LOW (ref 3.87–5.11)
RDW: 14 % (ref 11.5–15.5)
WBC: 6.8 10*3/uL (ref 4.0–10.5)
nRBC: 0 % (ref 0.0–0.2)

## 2023-10-06 LAB — BASIC METABOLIC PANEL
Anion gap: 4 — ABNORMAL LOW (ref 5–15)
BUN: 11 mg/dL (ref 8–23)
CO2: 23 mmol/L (ref 22–32)
Calcium: 7.9 mg/dL — ABNORMAL LOW (ref 8.9–10.3)
Chloride: 107 mmol/L (ref 98–111)
Creatinine, Ser: 0.75 mg/dL (ref 0.44–1.00)
GFR, Estimated: >60 mL/min (ref >60)
Glucose, Bld: 171 mg/dL — ABNORMAL HIGH (ref 70–99)
Potassium: 3.7 mmol/L (ref 3.5–5.1)
Sodium: 134 mmol/L — ABNORMAL LOW (ref 135–145)

## 2023-10-06 LAB — GLUCOSE, CAPILLARY
Glucose-Capillary: 168 mg/dL — ABNORMAL HIGH (ref 70–99)
Glucose-Capillary: 197 mg/dL — ABNORMAL HIGH (ref 70–99)
Glucose-Capillary: 138 mg/dL — ABNORMAL HIGH (ref 70–99)
Glucose-Capillary: 201 mg/dL — ABNORMAL HIGH (ref 70–99)

## 2023-10-06 NOTE — Progress Notes (Signed)
Physical Therapy Treatment Patient Details Name: [name] MRN: [id number] DOB: [birth year] Today's Date: 10/06/2023   History of Present Illness Pt is a 89 yo female presenting for evaluation of dark stools x several episodes over ~11 days, anemia, progressive fatigue and generalized weakness. PMH includes colon cancer (s/p of partial colectomy), hypertension, hyperlipidemia, DM, diastolic CHF, stroke, GERD, bilateral carotid artery stenosis, diverticulosis, PSVT, IBS.    PT Comments  Pt received in bed, supportive daughters at bedside. Pt states she is feeling much better and is anxious to return home once medically cleared. Pt has made significant functional improvement since admission. She is able to demonstrate bed mobility without physical assist as well as transfers. Gait training around nursing station with RW for safety, however probably would have been safe with HHA or SPC. No LOB or c/o dizziness with positional changes with HgB at 7.6. Pt educated on pacing and taking time with position changes in case of future Hgb drop. Pt and daughter's state pt does not have DME needs upon d/c.   If plan is discharge home, recommend the following: A little help with walking and/or transfers;A little help with bathing/dressing/bathroom;Assistance with feeding;Direct supervision/assist for financial management;Assist for transportation;Assistance with cooking/housework   Can travel by private vehicle        Equipment Recommendations  None recommended by PT (Pt and family report pt does not have any DME needs)    Recommendations for Other Services       Precautions / Restrictions Precautions Precautions: Fall Restrictions Weight Bearing Restrictions: No     Mobility  Bed Mobility Overal bed mobility: Needs Assistance Bed Mobility: Supine to Sit     Supine to sit: Supervision     General bed mobility comments: Pt able to transfer to EOB without physical assist     Transfers Overall transfer level: Needs assistance Equipment used: None Transfers: Sit to/from Stand, Bed to chair/wheelchair/BSC Sit to Stand: Supervision           General transfer comment: Pt able to stand on first attempt from bed    Ambulation/Gait Ambulation/Gait assistance: Supervision, Contact guard assist Gait Distance (Feet): 160 Feet Assistive device: Rolling walker (2 wheels) Gait Pattern/deviations: Step-through pattern, Decreased stride length, Narrow base of support Gait velocity: decreased     General Gait Details: Pt used RW for safety in unfamiliar environment with Supervision, no LOB or SOB   Stairs             Wheelchair Mobility     Tilt Bed    Modified Rankin (Stroke Patients Only)       Balance Overall balance assessment: Needs assistance Sitting-balance support: Feet supported Sitting balance-Leahy Scale: Good     Standing balance support: No upper extremity supported Standing balance-Leahy Scale: Fair Standing balance comment: Pt without LOB during static unsupported standing, single HHA fo short distances, no LOB                            Cognition Arousal: Alert Behavior During Therapy: WFL for tasks assessed/performed Overall Cognitive Status: Within Functional Limits for tasks assessed                                          Exercises      General Comments General comments (skin integrity, edema, etc.): Discussed role of PT and  pt's recent hospital stay with pt and daughters (in room). Pt appears functionally close to baseline. No c/o dizziness with positional changes      Pertinent Vitals/Pain Pain Assessment Pain Assessment: No/denies pain    Home Living                          Prior Function            PT Goals (current goals can now be found in the care plan section) Acute Rehab PT Goals Patient Stated Goal: to get back to normal Progress towards PT goals:  Progressing toward goals    Frequency    Min 1X/week      PT Plan      Co-evaluation              AM-PAC PT "6 Clicks" Mobility   Outcome Measure  Help needed turning from your back to your side while in a flat bed without using bedrails?: A Little Help needed moving from lying on your back to sitting on the side of a flat bed without using bedrails?: A Little Help needed moving to and from a bed to a chair (including a wheelchair)?: A Little Help needed standing up from a chair using your arms (e.g., wheelchair or bedside chair)?: A Little Help needed to walk in hospital room?: A Little Help needed climbing 3-5 steps with a railing? : A Little 6 Click Score: 18    End of Session Equipment Utilized During Treatment: Gait belt Activity Tolerance: Patient tolerated treatment well Patient left: in bed;with call bell/phone within reach Nurse Communication: Mobility status PT Visit Diagnosis: Unsteadiness on feet (R26.81);Other abnormalities of gait and mobility (R26.89)     Time: 4401-0272 PT Time Calculation (min) (ACUTE ONLY): 14 min  Charges:    $Gait Training: 8-22 mins PT General Charges $$ ACUTE PT VISIT: 1 Visit                    Zadie Cleverly, PTA  Jannet Askew 10/06/2023, 3:34 PM

## 2023-10-06 NOTE — Anesthesia Postprocedure Evaluation (Signed)
Anesthesia Post Note  Patient: [name]  Procedure(s) Performed: COLONOSCOPY  Patient location during evaluation: PACU Anesthesia Type: General Level of consciousness: awake and alert Pain management: pain level controlled Vital Signs Assessment: post-procedure vital signs reviewed and stable Respiratory status: spontaneous breathing, nonlabored ventilation, respiratory function stable and patient connected to nasal cannula oxygen Cardiovascular status: blood pressure returned to baseline and stable Postop Assessment: no apparent nausea or vomiting Anesthetic complications: no   No notable events documented.   Last Vitals:  Vitals:   10/05/23 2002 10/06/23 0436  BP: 109/61 (!) 96/52  Pulse: 92 88  Resp: 18 18  Temp: 37.3 C 37.6 C  SpO2: 96% 96%    Last Pain:  Vitals:   10/06/23 0436  TempSrc: Oral  PainSc:                  [name]

## 2023-10-06 NOTE — Plan of Care (Signed)
  Problem: Education: Goal: Ability to describe self-care measures that may prevent or decrease complications (Diabetes Survival Skills Education) will improve Outcome: Progressing Goal: Individualized Educational Video(s) Outcome: Progressing   Problem: Coping: Goal: Ability to adjust to condition or change in health will improve Outcome: Progressing   Problem: Fluid Volume: Goal: Ability to maintain a balanced intake and output will improve Outcome: Progressing   Problem: Health Behavior/Discharge Planning: Goal: Ability to identify and utilize available resources and services will improve Outcome: Progressing Goal: Ability to manage health-related needs will improve Outcome: Progressing   Problem: Metabolic: Goal: Ability to maintain appropriate glucose levels will improve Outcome: Progressing   Problem: Nutritional: Goal: Maintenance of adequate nutrition will improve Outcome: Progressing Goal: Progress toward achieving an optimal weight will improve Outcome: Progressing   Problem: Skin Integrity: Goal: Risk for impaired skin integrity will decrease Outcome: Progressing   Problem: Tissue Perfusion: Goal: Adequacy of tissue perfusion will improve Outcome: Progressing   Problem: Education: Goal: Knowledge of General Education information will improve Description: Including pain rating scale, medication(s)/side effects and non-pharmacologic comfort measures Outcome: Progressing   Problem: Health Behavior/Discharge Planning: Goal: Ability to manage health-related needs will improve Outcome: Progressing   Problem: Clinical Measurements: Goal: Ability to maintain clinical measurements within normal limits will improve Outcome: Progressing Goal: Will remain free from infection Outcome: Progressing Goal: Diagnostic test results will improve Outcome: Progressing Goal: Respiratory complications will improve Outcome: Progressing Goal: Cardiovascular complication will  be avoided Outcome: Progressing   Problem: Activity: Goal: Risk for activity intolerance will decrease Outcome: Progressing   Problem: Nutrition: Goal: Adequate nutrition will be maintained Outcome: Progressing   Problem: Coping: Goal: Level of anxiety will decrease Outcome: Progressing   Problem: Elimination: Goal: Will not experience complications related to bowel motility Outcome: Progressing Goal: Will not experience complications related to urinary retention Outcome: Progressing   Problem: Pain Management: Goal: General experience of comfort will improve Outcome: Progressing   Problem: Safety: Goal: Ability to remain free from injury will improve Outcome: Progressing   Problem: Skin Integrity: Goal: Risk for impaired skin integrity will decrease Outcome: Progressing   Problem: Coping: Goal: Ability to adjust to condition or change in health will improve Outcome: Progressing   Problem: Nutritional: Goal: Progress toward achieving an optimal weight will improve Outcome: Progressing   Problem: Education: Goal: Knowledge of General Education information will improve Description: Including pain rating scale, medication(s)/side effects and non-pharmacologic comfort measures Outcome: Progressing   Problem: Clinical Measurements: Goal: Ability to maintain clinical measurements within normal limits will improve Outcome: Progressing

## 2023-10-06 NOTE — TOC Progression Note (Signed)
Transition of Care [facility]) - Progression Note    Patient Details  Name: [name] MRN: [id number] Date of Birth: 1929/06/08  [facility] CM/SW Contact  Allena Katz, LCSW Phone Number: 10/06/2023, 12:16 PM  Clinical Narrative:   CSW spoke with patient with two daughters present. Pt does not feel she needs HH. Pt has support as both of her daughters lives beside her. Pt reports she has canes, walkers, and a BSC at home and does not need any additional DME.     Expected Discharge Plan: Home w Home Health Services Barriers to Discharge: Continued Medical Work up  Expected Discharge Plan and Services                         DME Arranged: Walker rolling DME Agency: [employer]                   Social Determinants of Health (SDOH) Interventions SDOH Screenings   Food Insecurity: No Food Insecurity (10/03/2023)  Housing: Low Risk  (10/03/2023)  Transportation Needs: No Transportation Needs (10/03/2023)  Utilities: Not At Risk (10/03/2023)  Alcohol Screen: Low Risk  (05/09/2020)  Depression (PHQ2-9): Low Risk  (02/24/2023)  Financial Resource Strain: Low Risk  (05/09/2020)  Physical Activity: Inactive (05/09/2020)  Social Connections: Moderately Isolated (05/09/2020)  Stress: No Stress Concern Present (05/09/2020)  Tobacco Use: Medium Risk (10/05/2023)    Readmission Risk Interventions     No data to display

## 2023-10-06 NOTE — Progress Notes (Signed)
Progress Note   Patient: [name] UEA:[id number] DOB: [birth year] DOA: 10/02/2023     4 DOS: the patient was seen and examined on 10/06/2023   Brief hospital course:  EMMILYNN GARLINGER is a 89 y.o. female with medical history significant of colon cancer (s/p of partial colectomy), hypertension, hyperlipidemia, DM, diastolic CHF, stroke, GERD, bilateral carotid artery stenosis, diverticulosis, PSVT, IBS, who presented on 10/02/2023 for evaluation of dark stools x several episodes over ~11 days, progressive fatigue and generalized weakness.   Pt was found anemic with Hbg 9.1, down from 11.2 just two months ago. Pt was admitted for further evaluation and management with Gastroenterology consulted.   Further hospital course and management as outlined below.     Assessment and Plan:  Acute blood loss anemia Lower GI bleed (Possible diverticular bleed) Symptomatic anemia No further bleeding episodes Baseline hemoglobin of 11.2, down to 9.1 on admission with a further drop to 6.5 status post transfusion of 1 unit of packed RBC Hemoglobin today 7.5 down from 8.2 Status post upper and lower endoscopy which showed nonbleeding diverticulosis, prior colon cancer resection with anastomosis site healthy-appearing Continue to hold aspirin and heparin Repeat H&H in a.m. and if stable will discharge home    Generalized weakness - Secondary to 1 Appreciate PT/OT input    Chronic diastolic CHF  Stable and not acutely exacerbated 2D echo on 08/12/2023 showed EF of 60-85% with grade 1 diastolic dysfunction.   Monitor closely    Hypertension associated with diabetes (HCC) Patient remains normotensive Continue to hold amlodipine and hydrochlorothiazide    History of CVA (cerebrovascular accident) Hyperlipidemia associated with type 2 diabetes mellitus (HCC) Continue statins Aspirin is on hold due to acute blood loss anemia     Type 2 diabetes mellitus with hyperglycemia, without  long-term current use of insulin (HCC):  Recent A1c 7.5, poorly controlled.   Continue consistent carbohydrate diet --Sliding scale Novolog       Colon cancer  --S/p of partial colectomy    Lung nodule: Patient was recently found to have lung nodule.   Seen by Dr. Karna Christmas of pulmonology on 09/29/2023.   Appears being worked up for ?metastasized disease.   --Follow up as scheduled with Dr. Karna Christmas      Primary insomnia -- Continue home Restoril             Subjective: No new complaints.  Sleepy  Physical Exam: Vitals:   10/05/23 1704 10/05/23 2002 10/06/23 0436 10/06/23 0828  BP: (!) 108/53 109/61 (!) 96/52 (!) 106/51  Pulse: 90 92 88 82  Resp: 16 18 18    Temp: 97.6 F (36.4 C) 99.2 F (37.3 C) 99.7 F (37.6 C)   TempSrc: Oral Oral Oral   SpO2: 100% 96% 96% 93%  Weight:      Height:       General exam: Sleepy, arouses easily, no acute distress HEENT: moist mucus membranes, hearing grossly normal  Respiratory system: CTAB, no wheezes, rales or rhonchi, normal respiratory effort. Cardiovascular system: normal S1/S2, RRR, 2/6 systolic murmur noted, no pedal edema.   Gastrointestinal system: soft, NT, ND Central nervous system: A&O x3. no gross focal neurologic deficits, normal speech Extremities: moves all, no edema, normal tone Skin: dry, intact, normal temperature Psychiatry: normal mood, congruent affect, judgement and insight appear normal   Data Reviewed: Labs reviewed.  Hemoglobin 7.5 down from 8.2 There are no new results to review at this time.  Family Communication: Discussed plan of care with patient's  daughter Staci Righter.  All questions and concerns have been addressed.  She verbalizes understanding and agrees with the plan.  Disposition: Status is: Inpatient Remains inpatient appropriate because: Monitoring H&H  Planned Discharge Destination: Home    Time spent: 33 minutes  Author: Lucile Shutters, MD 10/06/2023 11:40 AM  For  on call review [email or web address].

## 2023-10-07 DIAGNOSIS — K5731 Diverticulosis of large intestine without perforation or abscess with bleeding: Secondary | ICD-10-CM

## 2023-10-07 LAB — HEMOGLOBIN AND HEMATOCRIT, BLOOD
HCT: 24.3 % — ABNORMAL LOW (ref 36.0–46.0)
Hemoglobin: 7.9 g/dL — ABNORMAL LOW (ref 12.0–15.0)

## 2023-10-07 LAB — GLUCOSE, CAPILLARY: Glucose-Capillary: 148 mg/dL — ABNORMAL HIGH (ref 70–99)

## 2023-10-07 MED ORDER — FLUCONAZOLE 200 MG PO TABS: 200.0000 mg | ORAL_TABLET | Freq: Every day | ORAL | 0 refills | Status: DC

## 2023-10-07 NOTE — Discharge Summary (Signed)
Physician Discharge Summary   Patient: [name] MRN: [id number] DOB: 1929-11-16  Admit date:     10/02/2023  Discharge date: 10/07/23  Discharge Physician: Riot Barrick   PCP: Jacky Kindle, FNP   Recommendations at discharge:   Hold aspirin until resumed by her PCP Hold amlodipine and hydrochlorothiazide until seen by her PCP.  Discharge Diagnoses: Principal Problem:   Diverticulosis of colon with hemorrhage Active Problems:   Acute blood loss anemia   Chronic diastolic CHF (congestive heart failure) (HCC)   Hypertension associated with diabetes (HCC)   Hyperlipidemia associated with type 2 diabetes mellitus (HCC)   Type 2 diabetes mellitus with hyperglycemia, without long-term current use of insulin (HCC)   History of CVA (cerebrovascular accident)   Colon cancer (HCC)   Lung nodule   Primary insomnia   Generalized weakness  Resolved Problems:   * No resolved hospital problems. *  Hospital Course:  [name] is a 89 y.o. female with medical history significant of colon cancer (s/p of partial colectomy), hypertension, hyperlipidemia, DM, diastolic CHF, stroke, GERD, bilateral carotid artery stenosis, diverticulosis, PSVT, IBS, who presents with dark stool.   Patient states that she has dark stool for almost a week, several episodes of dark stool bowel movement each day.  No nausea of vomiting.  No fever or chills.  Patient has intermittent mild abdominal cramping.  No chest pain, cough, shortness of breath.  No symptoms of UTI. Of note, in the last couple days she had very constipated feeling. She started taking laxative and colon prep, now has loose stool bowel movement.   Data reviewed independently and ED Course: pt was found to have hemoglobin 9.1 (11.2 on 9//24), GFR> CT, temperature normal, blood pressure 141/67, heart rate of 103, RR 18, oxygen saturation 94% on room air.  Patient is admitted to telemetry bed as inpatient.  Dr. Norma Fredrickson of GI is  consulted.   Assessment and Plan:   Acute blood loss anemia Lower GI bleed (Possible diverticular bleed) Symptomatic anemia Candida esophagitis No further bleeding episodes Baseline hemoglobin of 11.2, down to 9.1 on admission with a further drop to 6.5 status post transfusion of 1 unit of packed RBC Hemoglobin is stable on discharge at 7.9 Patient is status post upper and lower endoscopy which showed nonbleeding diverticulosis, prior colon cancer resection with anastomosis site healthy-appearing Continue to hold aspirin.  May resume by her primary care provider after her follow-up appointment if H&H remained stable Will discharge patient home on a course of Diflucan GI recommendation        Generalized weakness - Secondary to 1 Appreciate PT/OT input      Chronic diastolic CHF  Stable and not acutely exacerbated 2D echo on 08/12/2023 showed EF of 60-85% with grade 1 diastolic dysfunction.   Monitor closely     Hypertension associated with diabetes (HCC) Patient remains normotensive Continue to hold amlodipine and hydrochlorothiazide until resumed by PCP     History of CVA (cerebrovascular accident) Hyperlipidemia associated with type 2 diabetes mellitus (HCC) Continue statins Aspirin is on hold due to acute blood loss anemia May be resumed by her primary care provider if H&H is stable       Type 2 diabetes mellitus with hyperglycemia, without long-term current use of insulin (HCC):  Recent A1c 7.5, poorly controlled.   Continue consistent carbohydrate diet Continue glipizide         Colon cancer  --S/p of partial colectomy     Lung nodule:  Patient was recently found to have lung nodule.   Seen by Dr. Karna Christmas of pulmonology on 09/29/2023.   Appears being worked up for ?metastasized disease.   --Follow up as scheduled with Dr. Karna Christmas        Primary insomnia -- Continue home Restoril                 Consultants:  Gastroenterology Procedures performed: Upper and lower endoscopy Disposition: Home Diet recommendation: Consistent carbohydrate DISCHARGE MEDICATION: Allergies as of 10/07/2023       Reactions   Cephalosporins    "upset stomach"   Codeine    Macrolides And Ketolides    "upset stomach" Other reaction(s): Unknown "upset stomach"   Nitrofurantoin    Other reaction(s): Other (See Comments)   Penicillins    Tramadol Nausea Only   Doxycycline Rash   Sulfa Antibiotics Rash        Medication List     STOP taking these medications    amLODipine 10 MG tablet Commonly known as: NORVASC   aspirin EC 81 MG tablet   hydrochlorothiazide 12.5 MG tablet Commonly known as: HYDRODIURIL       TAKE these medications    acetaminophen 325 MG tablet Commonly known as: TYLENOL Take 2 tablets (650 mg total) by mouth every 6 (six) hours as needed for mild pain, moderate pain, fever or headache.   atorvastatin 40 MG tablet Commonly known as: LIPITOR Take 1 tablet (40 mg total) by mouth daily.   cholecalciferol 25 MCG (1000 UNIT) tablet Commonly known as: VITAMIN D3 Take 1,000 Units by mouth daily.   fluconazole 200 MG tablet Commonly known as: DIFLUCAN Take 1 tablet (200 mg total) by mouth daily. Start taking on: October 08, 2023   glipiZIDE 5 MG 24 hr tablet Commonly known as: GLUCOTROL XL Take 1 tablet (5 mg total) by mouth daily with breakfast.   meclizine 12.5 MG tablet Commonly known as: ANTIVERT Take 1 tablet (12.5 mg total) by mouth 2 (two) times daily as needed for dizziness.   temazepam 15 MG capsule Commonly known as: RESTORIL TAKE 1 CAPSULE BY MOUTH AT BEDTIME AS NEEDED FOR SLEEP        Follow-up Information     Jacky Kindle, FNP Follow up.   Specialty: Family Medicine Contact information: [address] [place] [address] [phone]                Discharge Exam: Ceasar Mons Weights   10/02/23 1632  Weight: 58.5 kg   General exam:  Awake, sitting up in a chair, family at the bedside.  No acute distress HEENT: moist mucus membranes, hearing grossly normal  Respiratory system: CTAB/L, no wheezes, rales or rhonchi, normal respiratory effort. Cardiovascular system: normal S1/S2, RRR, 2/6 systolic murmur noted, no pedal edema.   Gastrointestinal system: soft, NT, ND Central nervous system: A&O x3. no gross focal neurologic deficits, normal speech Extremities: moves all, no edema, normal tone Skin: dry, intact, normal temperature Psychiatry: normal mood, congruent affect, judgement and insight appear normal    Condition at discharge: stable  The results of significant diagnostics from this hospitalization (including imaging, microbiology, ancillary and laboratory) are listed below for reference.   Imaging Studies: CT ANGIO GI BLEED  Result Date: 10/02/2023 CLINICAL DATA:  Lower GI bleed EXAM: CTA ABDOMEN AND PELVIS WITHOUT AND WITH CONTRAST TECHNIQUE: Multidetector CT imaging of the abdomen and pelvis was performed using the standard protocol during bolus administration of intravenous contrast. Multiplanar reconstructed images and  MIPs were obtained and reviewed to evaluate the vascular anatomy. RADIATION DOSE REDUCTION: This exam was performed according to the departmental dose-optimization program which includes automated exposure control, adjustment of the mA and/or kV according to patient size and/or use of iterative reconstruction technique. CONTRAST:  OMNIPAQUE IOHEXOL 350 MG/ML SOLN COMPARISON:  None Available. FINDINGS: VASCULAR Aorta: Diffuse calcifications.  No aneurysm or dissection. Celiac: Patent.  No aneurysm or dissection or significant stenosis. SMA: Patent.  No aneurysm, dissection or significant stenosis. Renals: Single bilaterally, patent. IMA: Patent.  No aneurysm, dissection or significant stenosis. Inflow: Moderate atherosclerotic calcifications. No aneurysm, dissection or significant stenosis.  Proximal Outflow: Atherosclerotic calcifications. No aneurysm, dissection or significant stenosis. Veins: No obvious venous abnormality within the limitations of this arterial phase study. Review of the MIP images confirms the above findings. NON-VASCULAR Lower chest: No acute abnormality. Hepatobiliary: No focal liver abnormality is seen. Status post cholecystectomy. No biliary dilatation. Pancreas: No focal abnormality or ductal dilatation. Spleen: No focal abnormality.  Normal size. Adrenals/Urinary Tract: No adrenal abnormality. No focal renal abnormality. No stones or hydronephrosis. Urinary bladder is unremarkable. Stomach/Bowel: Postoperative changes within the right colon. Sigmoid diverticulosis. No active diverticulitis. Stomach and small bowel decompressed, unremarkable. No visible active contrast extravasation to localize GI bleed. Lymphatic: No adenopathy Reproductive: Uterus and adnexa unremarkable.  No mass. Other: No free fluid or free air. Musculoskeletal: No acute bony abnormality. IMPRESSION: VASCULAR No visible active contrast extravasation to localize GI bleed. Aortoiliac atherosclerosis. NON-VASCULAR Sigmoid diverticulosis.  No active diverticulitis. Electronically Signed   By: Charlett Nose M.D.   On: 10/02/2023 23:06    Microbiology: Results for orders placed or performed during the hospital encounter of 10/02/23  KOH prep     Status: Abnormal   Collection Time: 10/03/23 12:33 PM   Specimen: Esophagus  Result Value Ref Range Status   Specimen Description ESOPHAGUS  Final   Special Requests Normal  Final   KOH Prep (A)  Final    HYPHAE Performed at [facility], [address] [place], [place] [address]    Report Status 10/03/2023 FINAL  Final    Labs: CBC: Recent Labs  Lab 10/03/23 1725 10/03/23 2335 10/04/23 0502 10/04/23 1515 10/05/23 0845 10/05/23 1448 10/06/23 0447 10/07/23 0431  WBC 9.6 7.1 8.0  --  7.1  --  6.8  --   HGB 7.5* 6.5* 6.6* 8.9* 7.6*  8.2* 7.5* 7.9*  HCT 23.2* 20.5* 20.2* 27.4* 23.5* 25.0* 22.9* 24.3*  MCV 91.3 89.9 90.2  --  87.7  --  88.1  --   PLT 275 235 231  --  210  --  237  --    Basic Metabolic Panel: Recent Labs  Lab 10/02/23 1633 10/03/23 0127 10/04/23 0502 10/06/23 0447  NA 137 136 137 134*  K 3.8 3.6 3.5 3.7  CL 102 103 104 107  CO2 25 24 26 23   GLUCOSE 216* 185* 184* 171*  BUN 20 20 25* 11  CREATININE 0.73 0.69 0.82 0.75  CALCIUM 9.0 8.4* 8.1* 7.9*  MG  --   --  2.1  --    Liver Function Tests: Recent Labs  Lab 10/02/23 1633  AST 17  ALT 14  ALKPHOS 86  BILITOT 0.2  PROT 6.9  ALBUMIN 3.0*   CBG: Recent Labs  Lab 10/06/23 0829 10/06/23 1212 10/06/23 1701 10/06/23 2042 10/07/23 0740  GLUCAP 168* 197* 138* 201* 148*    Discharge time spent: greater than 30 minutes.  Signed: Karmen Altamirano  Joylene Igo, MD [facility] 10/07/2023

## 2023-10-08 ENCOUNTER — Telehealth: Payer: Self-pay

## 2023-10-08 NOTE — Transitions of Care (Post Inpatient/ED Visit) (Signed)
10/08/2023  Name: [name] MRN: [id number] DOB: 06-12-1929  Today's TOC FU Call Status: Today's TOC FU Call Status:: Successful TOC FU Call Completed TOC FU Call Complete Date: 10/08/23 Patient's Name and Date of Birth confirmed.  Transition Care Management Follow-up Telephone Call Date of Discharge: 10/07/23 Discharge Facility: [facility]) Type of Discharge: Inpatient Admission Primary Inpatient Discharge Diagnosis:: GI Bleed How have you been since you were released from the hospital?: Same Any questions or concerns?: No  Items Reviewed: Did you receive and understand the discharge instructions provided?: Yes Medications obtained,verified, and reconciled?: Yes (Medications Reviewed) Any new allergies since your discharge?: No Dietary orders reviewed?: Yes Type of Diet Ordered:: Consistent Carbohydrate Do you have support at home?: Yes People in Home: alone Name of Support/Comfort Primary Source: Both of her daughters live next door to her  Medications Reviewed Today: Medications Reviewed Today     Reviewed by Whitney Gainer, RN (Case Manager) on 10/08/23 at 1011  Med List Status: <None>   Medication Order Taking? Sig Documenting Provider Last Dose Status Informant  acetaminophen (TYLENOL) 325 MG tablet [id number] No Take 2 tablets (650 mg total) by mouth every 6 (six) hours as needed for mild pain, moderate pain, fever or headache. Tonna Boehringer, Isami, DO Unknown PRN Active Other  atorvastatin (LIPITOR) 40 MG tablet [id number] No Take 1 tablet (40 mg total) by mouth daily. Jacky Kindle, FNP Past Week Unknown Active Pharmacy Records  cholecalciferol (VITAMIN D) 1000 UNITS tablet [id number] No Take 1,000 Units by mouth daily.  [provider] Past Week Active Other           Med Note Nelia Shi   Tue Jun 22, 2023 10:37 AM)    fluconazole (DIFLUCAN) 200 MG tablet [id number]  Take 1 tablet (200 mg total) by mouth daily. Lucile Shutters, MD  Active   glipiZIDE (GLUCOTROL XL) 5 MG 24 hr tablet [id number] No Take 1 tablet (5 mg total) by mouth daily with breakfast. Jacky Kindle, FNP Past Week Unknown Active Pharmacy Records  meclizine (ANTIVERT) 12.5 MG tablet [id number] No Take 1 tablet (12.5 mg total) by mouth 2 (two) times daily as needed for dizziness. Jacky Kindle, FNP Unknown PRN Active Pharmacy Records  temazepam (RESTORIL) 15 MG capsule [id number] No TAKE 1 CAPSULE BY MOUTH AT BEDTIME AS NEEDED FOR SLEEP Jacky Kindle, FNP Unknown PRN Active Pharmacy Records            Home Care and Equipment/Supplies: Were Home Health Services Ordered?: No Any new equipment or medical supplies ordered?: Yes Name of Medical supply agency?: [employer] Were you able to get the equipment/medical supplies?: Yes Do you have any questions related to the use of the equipment/supplies?: No  Functional Questionnaire: Do you need assistance with bathing/showering or dressing?: No Do you need assistance with meal preparation?: Yes Do you need assistance with eating?: No Do you have difficulty maintaining continence: No Do you need assistance with getting out of bed/getting out of a chair/moving?: No Do you have difficulty managing or taking your medications?: No  Follow up appointments reviewed: PCP Follow-up appointment confirmed?: Yes Date of PCP follow-up appointment?: 10/13/23 Follow-up Provider: Laretta Alstrom Specialist [facility] Follow-up appointment confirmed?: NA Do you need transportation to your follow-up appointment?: No Do you understand care options if your condition(s) worsen?: Yes-patient verbalized understanding  SDOH Interventions Today    Flowsheet Row Most Recent Value  SDOH Interventions   Food Insecurity Interventions Intervention Not  Indicated  Housing Interventions Intervention Not Indicated  Transportation Interventions Intervention Not Indicated  Utilities Interventions Intervention Not Indicated       TOC Outreach completed. The patient discharged home from the hospital yesterday due to a GI bleed. The patient states this is the fourth admission to the hospital since April. She states she is "wore out". Noted she declined HHPT and she states she doesn't want it and that her daughter's live next door to her and they help when needed. Her daughter came in the home while on the phone to cook her breakfast. The patient states she is usually independent with everything including driving but she needs assistance from her family. Her daughters are laying out her medicine for her and she will review her latest stay at the hospital with her provider November 27th. Declines the continuing Outreach services.   The patient has been provided with contact information for the care management team and has been advised to call with any health-related questions or concerns. The patient verbalized understanding with current POC. The patient is directed to their insurance card regarding availability of benefits coverage  Whitney Ala, RN RN Care Manager [facility] [phone]

## 2023-10-13 ENCOUNTER — Ambulatory Visit: Payer: Medicare Other | Admitting: Family Medicine

## 2023-10-13 ENCOUNTER — Encounter: Payer: Self-pay | Admitting: Family Medicine

## 2023-10-13 VITALS — BP 126/74 | HR 97 | Temp 98.1°F | Ht 61.0 in | Wt 124.0 lb

## 2023-10-13 DIAGNOSIS — I152 Hypertension secondary to endocrine disorders: Secondary | ICD-10-CM

## 2023-10-13 DIAGNOSIS — D500 Iron deficiency anemia secondary to blood loss (chronic): Secondary | ICD-10-CM

## 2023-10-13 DIAGNOSIS — R627 Adult failure to thrive: Secondary | ICD-10-CM

## 2023-10-13 DIAGNOSIS — E1159 Type 2 diabetes mellitus with other circulatory complications: Secondary | ICD-10-CM

## 2023-10-13 DIAGNOSIS — E538 Deficiency of other specified B group vitamins: Secondary | ICD-10-CM

## 2023-10-13 DIAGNOSIS — K5731 Diverticulosis of large intestine without perforation or abscess with bleeding: Secondary | ICD-10-CM

## 2023-10-13 MED ORDER — PAROXETINE HCL 10 MG PO TABS: 10.0000 mg | ORAL_TABLET | Freq: Every day | ORAL | 0 refills | Status: DC

## 2023-10-13 NOTE — Progress Notes (Signed)
Established patient visit  Patient: [name]   DOB: [birth year]   89 y.o. Female  MRN: [id number] Visit Date: 10/13/2023  Today's healthcare provider: Jacky Kindle, FNP  Re Introduced to nurse practitioner role and practice setting.  All questions answered.  Discussed provider/patient relationship and expectations.  Chief Complaint  Patient presents with   medication f/u   Subjective    HPI HPI   Pt stated--still feeling fatigue, eating light, lower back pain. Last edited by Shelly Bombard, CMA on 10/13/2023  1:19 PM.      Medications: Outpatient Medications Prior to Visit  Medication Sig   acetaminophen (TYLENOL) 325 MG tablet Take 2 tablets (650 mg total) by mouth every 6 (six) hours as needed for mild pain, moderate pain, fever or headache.   atorvastatin (LIPITOR) 40 MG tablet Take 1 tablet (40 mg total) by mouth daily.   cholecalciferol (VITAMIN D) 1000 UNITS tablet Take 1,000 Units by mouth daily.    fluconazole (DIFLUCAN) 200 MG tablet Take 1 tablet (200 mg total) by mouth daily.   glipiZIDE (GLUCOTROL XL) 5 MG 24 hr tablet Take 1 tablet (5 mg total) by mouth daily with breakfast.   meclizine (ANTIVERT) 12.5 MG tablet Take 1 tablet (12.5 mg total) by mouth 2 (two) times daily as needed for dizziness.   OMEPRAZOLE PO Take by mouth.   Polyethylene Glycol 3350 (MIRALAX PO) Take by mouth.   temazepam (RESTORIL) 15 MG capsule TAKE 1 CAPSULE BY MOUTH AT BEDTIME AS NEEDED FOR SLEEP   No facility-administered medications prior to visit.   Last CBC Lab Results  Component Value Date   WBC 6.8 10/06/2023   HGB 7.9 (L) 10/07/2023   HCT 24.3 (L) 10/07/2023   MCV 88.1 10/06/2023   MCH 28.8 10/06/2023   RDW 14.0 10/06/2023   PLT 237 10/06/2023   Last metabolic panel Lab Results  Component Value Date   GLUCOSE 171 (H) 10/06/2023   NA 134 (L) 10/06/2023   K 3.7 10/06/2023   CL 107 10/06/2023   CO2 23 10/06/2023   BUN 11 10/06/2023   CREATININE 0.75 10/06/2023    GFRNONAA >60 10/06/2023   CALCIUM 7.9 (L) 10/06/2023   PROT 6.9 10/02/2023   ALBUMIN 3.0 (L) 10/02/2023   LABGLOB 2.2 07/13/2023   AGRATIO 1.6 07/06/2022   BILITOT 0.2 10/02/2023   ALKPHOS 86 10/02/2023   AST 17 10/02/2023   ALT 14 10/02/2023   ANIONGAP 4 (L) 10/06/2023   Last lipids Lab Results  Component Value Date   CHOL 149 07/13/2023   HDL 40 07/13/2023   LDLCALC 64 07/13/2023   TRIG 286 (H) 07/13/2023   CHOLHDL 3.7 07/13/2023   Last hemoglobin A1c Lab Results  Component Value Date   HGBA1C 7.5 (H) 07/13/2023   Last thyroid functions Lab Results  Component Value Date   TSH 1.800 07/13/2023   T4TOTAL 6.4 05/14/2020   Last vitamin D Lab Results  Component Value Date   VD25OH 26.5 (L) 07/13/2023   Last vitamin B12 and Folate Lab Results  Component Value Date   VITAMINB12 215 10/03/2023   FOLATE 16.6 10/03/2023     Objective    BP 126/74   Pulse 97   Temp 98.1 F (36.7 C)   Ht 5\' 1"  (1.549 m)   Wt 124 lb (56.2 kg)   SpO2 98%   BMI 23.43 kg/m   BP Readings from Last 3 Encounters:  10/13/23 126/74  10/07/23 Marland Kitchen)  111/51  09/23/23 124/73   Wt Readings from Last 3 Encounters:  10/13/23 124 lb (56.2 kg)  10/02/23 129 lb (58.5 kg)  08/24/23 127 lb 3.2 oz (57.7 kg)   SpO2 Readings from Last 3 Encounters:  10/13/23 98%  10/07/23 95%  08/24/23 100%   Physical Exam Vitals and nursing note reviewed.  Constitutional:      General: She is not in acute distress.    Appearance: Normal appearance. She is normal weight. She is not ill-appearing, toxic-appearing or diaphoretic.  HENT:     Head: Normocephalic and atraumatic.  Cardiovascular:     Rate and Rhythm: Normal rate and regular rhythm.     Pulses: Normal pulses.     Heart sounds: Normal heart sounds. No murmur heard.    No friction rub. No gallop.  Pulmonary:     Effort: Pulmonary effort is normal. No respiratory distress.     Breath sounds: Normal breath sounds. No stridor. No wheezing, rhonchi  or rales.  Chest:     Chest wall: No tenderness.  Abdominal:     General: Bowel sounds are normal.     Palpations: Abdomen is soft.  Musculoskeletal:        General: No swelling, tenderness, deformity or signs of injury. Normal range of motion.     Right lower leg: No edema.     Left lower leg: No edema.  Skin:    General: Skin is warm and dry.     Capillary Refill: Capillary refill takes less than 2 seconds.     Coloration: Skin is not jaundiced or pale.     Findings: No bruising, erythema, lesion or rash.  Neurological:     General: No focal deficit present.     Mental Status: She is alert and oriented to person, place, and time. Mental status is at baseline.     Cranial Nerves: No cranial nerve deficit.     Sensory: No sensory deficit.     Motor: No weakness.     Coordination: Coordination normal.  Psychiatric:        Mood and Affect: Mood normal.        Behavior: Behavior normal.        Thought Content: Thought content normal.        Judgment: Judgment normal.     No results found for any visits on 10/13/23.  Assessment & Plan     Problem List Items Addressed This Visit       Cardiovascular and Mediastinum   Hypertension associated with diabetes (HCC)    Chronic, stable Has stopped norvasc- continue to hold with hydrochlorothiazide  At goal today       Relevant Orders   CBC with Differential/Platelet   Comprehensive Metabolic Panel (CMET)   Hemoglobin A1c   Amb Referral to Palliative Care     Other   B12 deficiency   Relevant Orders   Ambulatory referral to Hematology / Oncology   Amb Referral to Palliative Care   FTT (failure to thrive) in adult - Primary    Chronic, refused home PT/OT/HH Has daughter and DIL to assist Use of walker at home Reports sitting in chair; poor endurance; poor exercise intolerance Discussed SMART goals- all expected given ongoing IDA s/p divert bleed       Relevant Orders   CBC with Differential/Platelet   Comprehensive  Metabolic Panel (CMET)   Hemoglobin A1c   Iron, TIBC and Ferritin Panel   Amb Referral to Palliative Care  Iron deficiency anemia due to chronic blood loss    Chronic, was transfused Repeat labs Referral to hem onc       Relevant Orders   Iron, TIBC and Ferritin Panel   Ambulatory referral to Hematology / Oncology   Amb Referral to Palliative Care   Return in about 6 weeks (around 11/24/2023), or if symptoms worsen or fail to improve, for chonic disease management.     Leilani Merl, FNP, have reviewed all documentation for this visit. The documentation on 10/13/23 for the exam, diagnosis, procedures, and orders are all accurate and complete.  Jacky Kindle, FNP  [facility] [phone] (phone) [phone] (fax)  [facility]

## 2023-10-13 NOTE — Assessment & Plan Note (Signed)
Chronic, stable Has stopped norvasc- continue to hold with hydrochlorothiazide  At goal today

## 2023-10-13 NOTE — Assessment & Plan Note (Signed)
Chronic, refused home PT/OT/HH Has daughter and DIL to assist Use of walker at home Reports sitting in chair; poor endurance; poor exercise intolerance Discussed SMART goals- all expected given ongoing IDA s/p divert bleed

## 2023-10-13 NOTE — Assessment & Plan Note (Signed)
Chronic, was transfused Repeat labs Referral to hem onc

## 2023-10-14 LAB — CBC WITH DIFFERENTIAL/PLATELET
Basophils Absolute: 0 10*3/uL (ref 0.0–0.2)
Basos: 0 %
EOS (ABSOLUTE): 0.1 10*3/uL (ref 0.0–0.4)
Eos: 1 %
Hematocrit: 27.7 % — ABNORMAL LOW (ref 34.0–46.6)
Hemoglobin: 8.5 g/dL — ABNORMAL LOW (ref 11.1–15.9)
Immature Grans (Abs): 0 10*3/uL (ref 0.0–0.1)
Immature Granulocytes: 0 %
Lymphocytes Absolute: 1.7 10*3/uL (ref 0.7–3.1)
Lymphs: 18 %
MCH: 27 pg (ref 26.6–33.0)
MCHC: 30.7 g/dL — ABNORMAL LOW (ref 31.5–35.7)
MCV: 88 fL (ref 79–97)
Monocytes Absolute: 0.7 10*3/uL (ref 0.1–0.9)
Monocytes: 7 %
Neutrophils Absolute: 7.1 10*3/uL — ABNORMAL HIGH (ref 1.4–7.0)
Neutrophils: 74 %
Platelets: 373 10*3/uL (ref 150–450)
RBC: 3.15 x10E6/uL — ABNORMAL LOW (ref 3.77–5.28)
RDW: 13.3 % (ref 11.7–15.4)
WBC: 9.6 10*3/uL (ref 3.4–10.8)

## 2023-10-14 LAB — IRON,TIBC AND FERRITIN PANEL
Ferritin: 101 ng/mL (ref 15–150)
Iron Saturation: 5 % — CL (ref 15–55)
Iron: 12 ug/dL — ABNORMAL LOW (ref 27–139)
Total Iron Binding Capacity: 235 ug/dL — ABNORMAL LOW (ref 250–450)
UIBC: 223 ug/dL (ref 118–369)

## 2023-10-14 LAB — COMPREHENSIVE METABOLIC PANEL
ALT: 14 [IU]/L (ref 0–32)
AST: 19 [IU]/L (ref 0–40)
Albumin: 3.4 g/dL — ABNORMAL LOW (ref 3.6–4.6)
Alkaline Phosphatase: 149 [IU]/L — ABNORMAL HIGH (ref 44–121)
BUN/Creatinine Ratio: 16 (ref 12–28)
BUN: 12 mg/dL (ref 10–36)
Bilirubin Total: 0.4 mg/dL (ref 0.0–1.2)
CO2: 21 mmol/L (ref 20–29)
Calcium: 9 mg/dL (ref 8.7–10.3)
Chloride: 101 mmol/L (ref 96–106)
Creatinine, Ser: 0.77 mg/dL (ref 0.57–1.00)
Globulin, Total: 3 g/dL (ref 1.5–4.5)
Glucose: 212 mg/dL — ABNORMAL HIGH (ref 70–99)
Potassium: 5.1 mmol/L (ref 3.5–5.2)
Sodium: 137 mmol/L (ref 134–144)
Total Protein: 6.4 g/dL (ref 6.0–8.5)
eGFR: 72 mL/min/{1.73_m2} (ref >59)

## 2023-10-14 LAB — HEMOGLOBIN A1C
Est. average glucose Bld gHb Est-mCnc: 157 mg/dL
Hgb A1c MFr Bld: 7.1 % — ABNORMAL HIGH (ref 4.8–5.6)

## 2023-10-18 ENCOUNTER — Ambulatory Visit: Payer: Medicare Other | Admitting: Radiation Oncology

## 2023-10-18 ENCOUNTER — Encounter: Payer: Self-pay | Admitting: Family Medicine

## 2023-10-18 NOTE — Progress Notes (Signed)
Labs have improved but show low levels of protein and iron stores. A1c remains stable.   Continue to monitor symptoms and follow up with hematology for iron infusion and B-12 assistance.

## 2023-10-19 ENCOUNTER — Inpatient Hospital Stay: Payer: Medicare Other

## 2023-10-19 ENCOUNTER — Inpatient Hospital Stay: Payer: Medicare Other | Attending: Oncology | Admitting: Oncology

## 2023-10-19 ENCOUNTER — Other Ambulatory Visit: Payer: Self-pay | Admitting: Family Medicine

## 2023-10-19 ENCOUNTER — Encounter: Payer: Self-pay | Admitting: Oncology

## 2023-10-19 VITALS — BP 111/72 | HR 96 | Temp 97.8°F | Resp 19 | Wt 122.7 lb

## 2023-10-19 DIAGNOSIS — D500 Iron deficiency anemia secondary to blood loss (chronic): Secondary | ICD-10-CM | POA: Insufficient documentation

## 2023-10-19 DIAGNOSIS — R911 Solitary pulmonary nodule: Secondary | ICD-10-CM | POA: Diagnosis not present

## 2023-10-19 DIAGNOSIS — K5791 Diverticulosis of intestine, part unspecified, without perforation or abscess with bleeding: Secondary | ICD-10-CM | POA: Insufficient documentation

## 2023-10-19 DIAGNOSIS — Z85038 Personal history of other malignant neoplasm of large intestine: Secondary | ICD-10-CM | POA: Diagnosis not present

## 2023-10-19 DIAGNOSIS — D509 Iron deficiency anemia, unspecified: Secondary | ICD-10-CM | POA: Insufficient documentation

## 2023-10-19 MED ORDER — OMEPRAZOLE 40 MG PO CPDR: 40.0000 mg | DELAYED_RELEASE_CAPSULE | Freq: Every day | ORAL | 0 refills | Status: DC

## 2023-10-19 NOTE — Progress Notes (Signed)
[facility]  [phone] [phone]  ID: [name] OB: Jan 11, 1929  MR#: [id number]  NAT#:[id number]  Patient Care Team: Jacky Kindle, FNP as PCP - General (Family Medicine) [place] Crane, MD as Consulting Physician (Ophthalmology) Lady Gary Darlin Priestly, MD as Consulting Physician (Cardiology) Troxler, Blanchie Serve (Inactive) as Attending Physician (Podiatry) Dasher, Cliffton Asters, MD (Dermatology) Jeralyn Ruths, MD as Consulting Physician (Oncology)  CHIEF COMPLAINT: History of stage I colon cancer, history left lower lobe lung nodule presumed malignant, iron deficiency anemia secondary to diverticular bleed.  INTERVAL HISTORY: Patient is a 89 year old female who recently was admitted to the hospital with acute blood loss anemia secondary to diverticular bleed.  She has a history of a stage I colon cancer and underwent colon resection on June 21, 2023.  She also underwent XRT for a highly suspicious pulmonary nodule completing in July 2024.  She has increased weakness and fatigue, but otherwise feels well.  She has no neurological plaints.  She denies any recent fevers.  She has good appetite and denies weight loss.  She has no chest pain, shortness of breath, cough, or hemoptysis.  She denies any nausea, vomiting, constipation, or diarrhea.  She has no further melena or hematochezia.  She has no urinary complaints.  Patient offers no further specific complaints today.  REVIEW OF SYSTEMS:   Review of Systems  Constitutional:  Positive for malaise/fatigue. Negative for fever and weight loss.  Respiratory: Negative.  Negative for cough, hemoptysis and shortness of breath.   Cardiovascular: Negative.  Negative for chest pain and leg swelling.  Gastrointestinal: Negative.  Negative for abdominal pain, blood in stool and melena.  Genitourinary: Negative.  Negative for hematuria.  Musculoskeletal: Negative.  Negative for back pain.  Skin: Negative.   Negative for rash.  Neurological: Negative.  Negative for dizziness, focal weakness, weakness and headaches.  Psychiatric/Behavioral: Negative.  The patient is not nervous/anxious.     As per HPI. Otherwise, a complete review of systems is negative.  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   a.) CT chest 03/09/2023: fusiform dilatation measuring 4.3 cm   Basal cell carcinoma (BCC) of left medial cheek    a.) s/p Mohs surgery 07/13/2022 at [facility]   Bilateral carotid artery disease [facility])    a.) doppler 04/03/2007: 50-75% RICA; b.) s/p LEFT CEA with CorMatrix patch angioplasty 01/18/2015; c.) doppler 11/02/2016: 40-59% RICA; d.) doppler 01/10/2018: 1-39% RICA   Diverticulosis 08/05/2015   Eczema of both upper extremities 06/10/2021   Heart failure with preserved left ventricular function (HFpEF) (HCC)    a.) TTE 05/17/2017: EF >55%, mild LVH, mild LAE, mild panval regurg; b.) TTE 06/27/2018 : EF >55%, mod LVH, mild LAE, mild panval regurg'; G1dd; c.) TTE 12/07/2019: EF >55%, mild LVH, mild MAC, mild panval regurg; d.) TTE 01/27/2021: EF >55%, mod LVH, triv TR, mild AR/MR, G1DD   Heart murmur    History of right cataract extraction    Hyperlipidemia  Hypertension    Insomnia    a.) on BZO (temazepam) PRN   Irritable bowel syndrome with diarrhea 04/20/2017   LBP (low back pain) 08/05/2015   Melanoma (HCC)    a.) s/p Mohs surgery 02/06/2020 - LEFT medial brow --> V to Y advancement flap   Melena    Non-small  cell lung cancer (NSCLC) (HCC) 03/09/2023   a.) CT chest 03/09/2023 --> superior segment LLL --> 2.1 x 1.7 x 2.1 cm mixed ground-glass and subpleural nodule extending to pleura with irreg margins --> concerning for primary bronchogenic carcinoma rather than metastatic disease; b.) 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 (60 Gy over 6 fractions)   OAB (overactive bladder)    PONV (postoperative nausea and vomiting)    a.) single episode following cataract surgery   PSVT (paroxysmal supraventricular tachycardia) (HCC) 02/22/2015   a.) holter 02/22/2015: 6 runs with longest lasting 10 beats   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   a.) MRI brain 04/02/2007 - subacute RIGHT pontine CVA   Stroke (HCC) 12/16/2014   a.) MRI brain 12/16/2014: punctate foci of restricted diffusion x 5 consistent with microembolic infarctions affecting the RIGHT lateral medulla, LEFT temporal lobe, 2 foci in the RIGHT thalamus, and LEFT inferior  frontal lobe/basal ganglia --> distribution would suggest cardiac or ascending aortic source   T2DM (type 2 diabetes mellitus) (HCC)    Tubular adenoma of colon     PAST SURGICAL HISTORY: Past Surgical History:  Procedure Laterality Date   APPENDECTOMY     BREAST BIOPSY Right    neg   BREAST SURGERY     1980's   CAROTID ENDARTERECTOMY Left 01/18/2015   Procedure: CAROTID ENDARTERECTOMY; Location: [facility]; Surgeon: [name], MD   CATARACT EXTRACTION Right 2012   CHOLECYSTECTOMY  06/27/2012   COLONOSCOPY N/A 10/05/2023   Procedure: COLONOSCOPY;  Surgeon: Toledo, Boykin Nearing, MD;  Location: [facility] ENDOSCOPY;  Service: Gastroenterology;  Laterality: N/A;   COLONOSCOPY WITH PROPOFOL N/A 02/17/2023   Procedure: COLONOSCOPY WITH PROPOFOL;  Surgeon: Regis Bill, MD;  Location: [facility] ENDOSCOPY;  Service: Endoscopy;  Laterality: N/A;   DECOMPRESSIVE LUMBAR LAMINECTOMY LEVEL 1  1950   ESOPHAGEAL BRUSHING  10/03/2023   Procedure: ESOPHAGEAL BRUSHING;  Surgeon: Norma Fredrickson,  Boykin Nearing, MD;  Location: [facility] ENDOSCOPY;  Service: Gastroenterology;;   ESOPHAGOGASTRODUODENOSCOPY N/A 10/03/2023   Procedure: ESOPHAGOGASTRODUODENOSCOPY (EGD);  Surgeon: Toledo, Boykin Nearing, MD;  Location: [facility] ENDOSCOPY;  Service: Gastroenterology;  Laterality: N/A;   ESOPHAGOGASTRODUODENOSCOPY (EGD) WITH PROPOFOL N/A 02/16/2023   Procedure: ESOPHAGOGASTRODUODENOSCOPY (EGD) WITH PROPOFOL;  Surgeon: Regis Bill, MD;  Location: [facility] ENDOSCOPY;  Service: Endoscopy;  Laterality: N/A;   MELANOMA EXCISION Left 02/06/2020   Procedure: MOHS SURGERY WITH V TO Y ADVANCEMENT FLAP (LEFT MEDIAL EYEBROW); Location: [facility]   MOHS SURGERY Left 07/13/2022   Procedure: MOHS SURGERY (BCC LEFT MEDIAL CHEEK); Location: [facility]    FAMILY HISTORY: Family History  Problem Relation Age of Onset   Ovarian cancer Mother    Heart disease Brother    Heart attack Brother    Gout Brother    Alzheimer's disease Brother    Dementia Brother    Heart attack Brother    Diabetes Daughter    Melanoma Daughter    Stroke Son    Breast cancer Neg Hx     ADVANCED DIRECTIVES (Y/N):  N  HEALTH MAINTENANCE: Social History   Tobacco Use   Smoking status: Former    Current packs/day: 0.00    Types: Cigarettes  Quit date: 11/15/1976    Years since quitting: 46.9   Smokeless tobacco: Never  Vaping Use   Vaping status: Never Used  Substance Use Topics   Alcohol use: No   Drug use: No     Colonoscopy:  PAP:  Bone density:  Lipid panel:  Allergies  Allergen Reactions   Cephalosporins     "upset stomach"   Codeine    Macrolides And Ketolides     "upset stomach" Other reaction(s): Unknown "upset stomach"   Nitrofurantoin     Other reaction(s): Other (See Comments)   Penicillins    Tramadol Nausea Only   Doxycycline Rash   Sulfa Antibiotics Rash    Current Outpatient Medications  Medication Sig Dispense Refill   acetaminophen (TYLENOL) 325 MG tablet Take 2 tablets (650 mg total) by mouth every 6  (six) hours as needed for mild pain, moderate pain, fever or headache. 30 tablet 0   atorvastatin (LIPITOR) 40 MG tablet Take 1 tablet (40 mg total) by mouth daily. 90 tablet 3   cholecalciferol (VITAMIN D) 1000 UNITS tablet Take 1,000 Units by mouth daily.      glipiZIDE (GLUCOTROL XL) 5 MG 24 hr tablet Take 1 tablet (5 mg total) by mouth daily with breakfast. 90 tablet 3   PARoxetine (PAXIL) 10 MG tablet Take 1 tablet (10 mg total) by mouth daily. 90 tablet 0   Polyethylene Glycol 3350 (MIRALAX PO) Take by mouth.     temazepam (RESTORIL) 15 MG capsule TAKE 1 CAPSULE BY MOUTH AT BEDTIME AS NEEDED FOR SLEEP 90 capsule 3   fluconazole (DIFLUCAN) 200 MG tablet Take 1 tablet (200 mg total) by mouth daily. (Patient not taking: Reported on 10/19/2023) 10 tablet 0   meclizine (ANTIVERT) 12.5 MG tablet Take 1 tablet (12.5 mg total) by mouth 2 (two) times daily as needed for dizziness. (Patient not taking: Reported on 10/19/2023) 30 tablet 0   omeprazole (PRILOSEC) 40 MG capsule Take 1 capsule (40 mg total) by mouth daily. (Patient not taking: Reported on 10/19/2023) 30 capsule 0   No current facility-administered medications for this visit.    OBJECTIVE: Vitals:   10/19/23 1336  BP: 111/72  Pulse: 96  Resp: 19  Temp: 97.8 F (36.6 C)  SpO2: 97%     Body mass index is 23.18 kg/m.    ECOG FS:1 - Symptomatic but completely ambulatory  General: Well-developed, well-nourished, no acute distress. Eyes: Pink conjunctiva, anicteric sclera. HEENT: Normocephalic, moist mucous membranes. Lungs: No audible wheezing or coughing. Heart: Regular rate and rhythm. Abdomen: Soft, nontender, no obvious distention. Musculoskeletal: No edema, cyanosis, or clubbing. Neuro: Alert, answering all questions appropriately. Cranial nerves grossly intact. Skin: No rashes or petechiae noted. Psych: Normal affect. Lymphatics: No cervical, calvicular, axillary or inguinal LAD.   LAB RESULTS:  Lab Results  Component  Value Date   NA 137 10/13/2023   K 5.1 10/13/2023   CL 101 10/13/2023   CO2 21 10/13/2023   GLUCOSE 212 (H) 10/13/2023   BUN 12 10/13/2023   CREATININE 0.77 10/13/2023   CALCIUM 9.0 10/13/2023   PROT 6.4 10/13/2023   ALBUMIN 3.4 (L) 10/13/2023   AST 19 10/13/2023   ALT 14 10/13/2023   ALKPHOS 149 (H) 10/13/2023   BILITOT 0.4 10/13/2023   GFRNONAA >60 10/06/2023   GFRAA 82 05/14/2020    Lab Results  Component Value Date   WBC 9.6 10/13/2023   NEUTROABS 7.1 (H) 10/13/2023   HGB 8.5 (L) 10/13/2023   HCT  27.7 (L) 10/13/2023   MCV 88 10/13/2023   PLT 373 10/13/2023   Lab Results  Component Value Date   IRON 12 (L) 10/13/2023   TIBC 235 (L) 10/13/2023   IRONPCTSAT 5 (LL) 10/13/2023   Lab Results  Component Value Date   FERRITIN 101 10/13/2023     STUDIES: CT ANGIO GI BLEED  Result Date: 10/02/2023 CLINICAL DATA:  Lower GI bleed EXAM: CTA ABDOMEN AND PELVIS WITHOUT AND WITH CONTRAST TECHNIQUE: Multidetector CT imaging of the abdomen and pelvis was performed using the standard protocol during bolus administration of intravenous contrast. Multiplanar reconstructed images and MIPs were obtained and reviewed to evaluate the vascular anatomy. RADIATION DOSE REDUCTION: This exam was performed according to the departmental dose-optimization program which includes automated exposure control, adjustment of the mA and/or kV according to patient size and/or use of iterative reconstruction technique. CONTRAST:  OMNIPAQUE IOHEXOL 350 MG/ML SOLN COMPARISON:  None Available. FINDINGS: VASCULAR Aorta: Diffuse calcifications.  No aneurysm or dissection. Celiac: Patent.  No aneurysm or dissection or significant stenosis. SMA: Patent.  No aneurysm, dissection or significant stenosis. Renals: Single bilaterally, patent. IMA: Patent.  No aneurysm, dissection or significant stenosis. Inflow: Moderate atherosclerotic calcifications. No aneurysm, dissection or significant stenosis. Proximal  Outflow: Atherosclerotic calcifications. No aneurysm, dissection or significant stenosis. Veins: No obvious venous abnormality within the limitations of this arterial phase study. Review of the MIP images confirms the above findings. NON-VASCULAR Lower chest: No acute abnormality. Hepatobiliary: No focal liver abnormality is seen. Status post cholecystectomy. No biliary dilatation. Pancreas: No focal abnormality or ductal dilatation. Spleen: No focal abnormality.  Normal size. Adrenals/Urinary Tract: No adrenal abnormality. No focal renal abnormality. No stones or hydronephrosis. Urinary bladder is unremarkable. Stomach/Bowel: Postoperative changes within the right colon. Sigmoid diverticulosis. No active diverticulitis. Stomach and small bowel decompressed, unremarkable. No visible active contrast extravasation to localize GI bleed. Lymphatic: No adenopathy Reproductive: Uterus and adnexa unremarkable.  No mass. Other: No free fluid or free air. Musculoskeletal: No acute bony abnormality. IMPRESSION: VASCULAR No visible active contrast extravasation to localize GI bleed. Aortoiliac atherosclerosis. NON-VASCULAR Sigmoid diverticulosis.  No active diverticulitis. Electronically Signed   By: Charlett Nose M.D.   On: 10/02/2023 23:06    ASSESSMENT: History of stage I colon cancer, history left lower lobe lung nodule presumed malignant, iron deficiency anemia secondary to diverticular bleed.  PLAN:    Iron deficiency anemia: Secondary to diverticular bleed.  Patient's most recent colonoscopy and upper endoscopy was on October 05, 2023.  Her hemoglobin and iron stores remain significantly reduced and she is symptomatic.  Return to clinic 5 times over the next 2 to 3 weeks to receive 200 mg IV Venofer.  Patient will then return to clinic in 4 months with repeat laboratory work, further evaluation, and continuation of treatment if needed. Stage I colon cancer: Patient underwent surgical resection on June 21, 2023.   0/13 lymph nodes did not reveal evidence of malignancy.  Given the stage of disease, she did not require any further adjuvant treatment.  Her most recent colonoscopy as above showed no evidence of malignancy. Left lower lobe lung nodule: No biopsy was obtained, but nodule was approximately 2.2 cm and significantly hypermetabolic on PET scan from Apr 01, 2023 therefore presumed malignant.  She underwent XRT completing in July 2024.  Will get a restaging CT scan of the chest in January 2025.  I spent a total of 60 minutes reviewing chart data, face-to-face evaluation with the patient, counseling and  coordination of care as detailed above.   Patient expressed understanding and was in agreement with this plan. She also understands that She can call clinic at any time with any questions, concerns, or complaints.    Cancer Staging  Colon cancer [facility]) Staging form: Colon and Rectum, AJCC 8th Edition - Pathologic stage from 06/21/2023: Stage I (pT2, pN0, cM0) - Signed by Jeralyn Ruths, MD on 10/19/2023 Stage prefix: Initial diagnosis Total positive nodes: 0   Jeralyn Ruths, MD   10/19/2023 2:27 PM

## 2023-10-20 ENCOUNTER — Inpatient Hospital Stay: Payer: Medicare Other

## 2023-10-20 VITALS — BP 107/58 | HR 98 | Temp 98.1°F | Resp 18

## 2023-10-20 DIAGNOSIS — D509 Iron deficiency anemia, unspecified: Secondary | ICD-10-CM

## 2023-10-20 DIAGNOSIS — D500 Iron deficiency anemia secondary to blood loss (chronic): Secondary | ICD-10-CM | POA: Diagnosis not present

## 2023-10-20 DIAGNOSIS — R911 Solitary pulmonary nodule: Secondary | ICD-10-CM | POA: Diagnosis not present

## 2023-10-20 DIAGNOSIS — Z85038 Personal history of other malignant neoplasm of large intestine: Secondary | ICD-10-CM | POA: Diagnosis not present

## 2023-10-20 DIAGNOSIS — K5791 Diverticulosis of intestine, part unspecified, without perforation or abscess with bleeding: Secondary | ICD-10-CM | POA: Diagnosis not present

## 2023-10-20 MED ORDER — SODIUM CHLORIDE 0.9% FLUSH
10.0000 mL | Freq: Once | INTRAVENOUS | Status: AC | PRN
  Administered 2023-10-20: 10 mL
  Filled 2023-10-20: qty 10

## 2023-10-20 MED ORDER — IRON SUCROSE 20 MG/ML IV SOLN
200.0000 mg | Freq: Once | INTRAVENOUS | Status: AC
  Administered 2023-10-20: 200 mg via INTRAVENOUS
  Filled 2023-10-20: qty 10

## 2023-10-20 NOTE — Patient Instructions (Signed)
Iron Sucrose Injection What is this medication? IRON SUCROSE (EYE ern SOO krose) treats low levels of iron (iron deficiency anemia) in people with kidney disease. Iron is a mineral that plays an important role in making red blood cells, which carry oxygen from your lungs to the rest of your body. This medicine may be used for other purposes; ask your health care provider or pharmacist if you have questions. COMMON BRAND NAME(S): Venofer What should I tell my care team before I take this medication? They need to know if you have any of these conditions: Anemia not caused by low iron levels Heart disease High levels of iron in the blood Kidney disease Liver disease An unusual or allergic reaction to iron, other medications, foods, dyes, or preservatives Pregnant or trying to get pregnant Breastfeeding How should I use this medication? This medication is for infusion into a vein. It is given in a hospital or clinic setting. Talk to your care team about the use of this medication in children. While this medication may be prescribed for children as young as 2 years for selected conditions, precautions do apply. Overdosage: If you think you have taken too much of this medicine contact a poison control center or emergency room at once. NOTE: This medicine is only for you. Do not share this medicine with others. What if I miss a dose? Keep appointments for follow-up doses. It is important not to miss your dose. Call your care team if you are unable to keep an appointment. What may interact with this medication? Do not take this medication with any of the following: Deferoxamine Dimercaprol Other iron products This medication may also interact with the following: Chloramphenicol Deferasirox This list may not describe all possible interactions. Give your health care provider a list of all the medicines, herbs, non-prescription drugs, or dietary supplements you use. Also tell them if you smoke,  drink alcohol, or use illegal drugs. Some items may interact with your medicine. What should I watch for while using this medication? Visit your care team regularly. Tell your care team if your symptoms do not start to get better or if they get worse. You may need blood work done while you are taking this medication. You may need to follow a special diet. Talk to your care team. Foods that contain iron include: whole grains/cereals, dried fruits, beans, or peas, leafy green vegetables, and organ meats (liver, kidney). What side effects may I notice from receiving this medication? Side effects that you should report to your care team as soon as possible: Allergic reactions--skin rash, itching, hives, swelling of the face, lips, tongue, or throat Low blood pressure--dizziness, feeling faint or lightheaded, blurry vision Shortness of breath Side effects that usually do not require medical attention (report to your care team if they continue or are bothersome): Flushing Headache Joint pain Muscle pain Nausea Pain, redness, or irritation at injection site This list may not describe all possible side effects. Call your doctor for medical advice about side effects. You may report side effects to FDA at 1-800-FDA-1088. Where should I keep my medication? This medication is given in a hospital or clinic. It will not be stored at home. NOTE: This sheet is a summary. It may not cover all possible information. If you have questions about this medicine, talk to your doctor, pharmacist, or health care provider.  2024 Elsevier/Gold Standard (2023-04-09 00:00:00)

## 2023-10-26 ENCOUNTER — Inpatient Hospital Stay: Payer: Medicare Other

## 2023-10-26 VITALS — BP 126/67 | HR 93 | Temp 96.3°F | Resp 18

## 2023-10-26 DIAGNOSIS — K5791 Diverticulosis of intestine, part unspecified, without perforation or abscess with bleeding: Secondary | ICD-10-CM | POA: Diagnosis not present

## 2023-10-26 DIAGNOSIS — D509 Iron deficiency anemia, unspecified: Secondary | ICD-10-CM

## 2023-10-26 DIAGNOSIS — D500 Iron deficiency anemia secondary to blood loss (chronic): Secondary | ICD-10-CM | POA: Diagnosis not present

## 2023-10-26 DIAGNOSIS — Z85038 Personal history of other malignant neoplasm of large intestine: Secondary | ICD-10-CM | POA: Diagnosis not present

## 2023-10-26 DIAGNOSIS — R911 Solitary pulmonary nodule: Secondary | ICD-10-CM | POA: Diagnosis not present

## 2023-10-26 MED ORDER — IRON SUCROSE 20 MG/ML IV SOLN
200.0000 mg | Freq: Once | INTRAVENOUS | Status: AC
  Administered 2023-10-26: 200 mg via INTRAVENOUS

## 2023-10-26 MED ORDER — SODIUM CHLORIDE 0.9% FLUSH
10.0000 mL | Freq: Once | INTRAVENOUS | Status: AC | PRN
  Administered 2023-10-26: 10 mL
  Filled 2023-10-26: qty 10

## 2023-10-28 ENCOUNTER — Inpatient Hospital Stay: Payer: Medicare Other

## 2023-10-28 VITALS — BP 102/73 | HR 88 | Temp 98.2°F | Resp 18

## 2023-10-28 DIAGNOSIS — K5791 Diverticulosis of intestine, part unspecified, without perforation or abscess with bleeding: Secondary | ICD-10-CM | POA: Diagnosis not present

## 2023-10-28 DIAGNOSIS — Z85038 Personal history of other malignant neoplasm of large intestine: Secondary | ICD-10-CM | POA: Diagnosis not present

## 2023-10-28 DIAGNOSIS — D509 Iron deficiency anemia, unspecified: Secondary | ICD-10-CM

## 2023-10-28 DIAGNOSIS — D500 Iron deficiency anemia secondary to blood loss (chronic): Secondary | ICD-10-CM | POA: Diagnosis not present

## 2023-10-28 DIAGNOSIS — R911 Solitary pulmonary nodule: Secondary | ICD-10-CM | POA: Diagnosis not present

## 2023-10-28 MED ORDER — SODIUM CHLORIDE 0.9% FLUSH
10.0000 mL | Freq: Once | INTRAVENOUS | Status: AC | PRN
  Administered 2023-10-28: 10 mL
  Filled 2023-10-28: qty 10

## 2023-10-28 MED ORDER — IRON SUCROSE 20 MG/ML IV SOLN
200.0000 mg | Freq: Once | INTRAVENOUS | Status: AC
  Administered 2023-10-28: 200 mg via INTRAVENOUS

## 2023-10-31 NOTE — Progress Notes (Unsigned)
Cardiology Office Note  Date:  11/01/2023   ID:  [name], DOB 1929/08/06, MRN [id number]  PCP:  Jacky Kindle, FNP   Chief Complaint  Patient presents with   New Patient (Initial Visit)    Ref by Merita Norton, FNP for aortic valve stenosis/CHF/HTN. Medications reviewed by the patient verbally.     HPI:  [name] is a 89 year old woman with past medical history of Aortic valve stenosis, to severe Hypertension Diabetes Chronic diastolic CHF Hyperlipidemia PAD, carotid stenosis, left carotid endarterectomy TIA Followed by [facility] cardiology last seen April 2024 Lung cancer, resection XRT, July 2024 Colon cancer aug 2024 Who presents for new patient evaluation of her aortic valve stenosis  On discussions today, she reports she has had a difficult year  Completed treatment for lung cancer and for colon cancer in 2024  Seen in the emergency room 9/24: dizziness, vertigo  Seen in the ER 11/24: Diverticulosis, melana Hemoglobin down to 6 Anemia managed by hematology oncology, Dr. Orlie Dakin, has received iron infusions Most recent hemoglobin in the 8 range  Remains tired, gives out on exertion No regular exercise program  Echocardiogram August 12, 2023 Moderately calcified aortic valve, mean gradient 30 mmHg, peak gradient 50 Mild AI Ejection fraction 60 to 65% Moderate to severe aortic valve stenosis  EKG personally reviewed by myself on todays visit EKG Interpretation Date/Time:  Monday November 01 2023 16:10:52 EST Ventricular Rate:  95 PR Interval:  138 QRS Duration:  84 QT Interval:  362 QTC Calculation: 454 R Axis:   -25  Text Interpretation: Sinus rhythm with occasional Premature ventricular complexes When compared with ECG of 03-Oct-2023 01:12, No significant change was found Confirmed by Julien Nordmann [id number]) on 11/01/2023 4:13:35 PM    PMH:   has a past medical history of Acid reflux (08/05/2015), 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), Anemia, Aortic atherosclerosis (HCC), Aortic stenosis, Ascending aorta dilatation (HCC) (03/09/2023), Basal cell carcinoma (BCC) of left medial cheek, Bilateral carotid artery disease (HCC), Diverticulosis (08/05/2015), Eczema of both upper extremities (06/10/2021), Heart failure with preserved left ventricular function (HFpEF) (HCC), Heart murmur, History of right cataract extraction, Hyperlipidemia, Hypertension, Insomnia, Irritable bowel syndrome with diarrhea (04/20/2017), LBP (low back pain) (08/05/2015), Melanoma (HCC), Melena, Non-small cell lung cancer (NSCLC) (HCC) (03/09/2023), OAB (overactive bladder), PONV (postoperative nausea and vomiting), PSVT (paroxysmal supraventricular tachycardia) (HCC) (02/22/2015), 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  PSH:    Past Surgical History:  Procedure Laterality Date   APPENDECTOMY     BREAST BIOPSY Right    neg   BREAST SURGERY     1980's   CAROTID ENDARTERECTOMY Left 01/18/2015   Procedure: CAROTID ENDARTERECTOMY; Location: [facility]; Surgeon: [name], MD   CATARACT EXTRACTION Right 2012   CHOLECYSTECTOMY  06/27/2012   COLONOSCOPY N/A 10/05/2023   Procedure: COLONOSCOPY;  Surgeon: Toledo, Boykin Nearing, MD;  Location: [facility] ENDOSCOPY;  Service: Gastroenterology;  Laterality: N/A;   COLONOSCOPY WITH PROPOFOL N/A 02/17/2023   Procedure: COLONOSCOPY WITH PROPOFOL;  Surgeon: Regis Bill, MD;  Location: [facility] ENDOSCOPY;  Service: Endoscopy;  Laterality: N/A;   DECOMPRESSIVE LUMBAR LAMINECTOMY LEVEL 1  1950   ESOPHAGEAL BRUSHING  10/03/2023   Procedure: ESOPHAGEAL BRUSHING;  Surgeon: Norma Fredrickson, Boykin Nearing, MD;  Location: [facility] ENDOSCOPY;  Service: Gastroenterology;;   ESOPHAGOGASTRODUODENOSCOPY N/A 10/03/2023   Procedure: ESOPHAGOGASTRODUODENOSCOPY (EGD);  Surgeon: Toledo, Boykin Nearing, MD;  Location: [facility] ENDOSCOPY;  Service:  Gastroenterology;  Laterality: N/A;   ESOPHAGOGASTRODUODENOSCOPY (EGD) WITH PROPOFOL N/A 02/16/2023   Procedure: ESOPHAGOGASTRODUODENOSCOPY (EGD) WITH PROPOFOL;  Surgeon: Regis Bill, MD;  Location: [facility] ENDOSCOPY;  Service: Endoscopy;  Laterality: N/A;   MELANOMA EXCISION Left 02/06/2020   Procedure: MOHS SURGERY WITH V TO Y ADVANCEMENT FLAP (LEFT MEDIAL EYEBROW); Location: [facility]   MOHS SURGERY Left 07/13/2022   Procedure: MOHS SURGERY (BCC LEFT MEDIAL CHEEK); Location: [facility]    Current Outpatient Medications  Medication Sig Dispense Refill   acetaminophen (TYLENOL) 325 MG tablet Take 2 tablets (650 mg total) by mouth every 6 (six) hours as needed for mild pain, moderate pain, fever or headache. 30 tablet 0   atorvastatin (LIPITOR) 40 MG tablet Take 1 tablet (40 mg total) by mouth daily. 90 tablet 3   cholecalciferol (VITAMIN D) 1000 UNITS tablet Take 1,000 Units by mouth daily.      glipiZIDE (GLUCOTROL XL) 5 MG 24 hr tablet Take 1 tablet (5 mg total) by mouth daily with breakfast. 90 tablet 3   PARoxetine (PAXIL) 10 MG tablet Take 1 tablet (10 mg total) by mouth daily. 90 tablet 0   Polyethylene Glycol 3350 (MIRALAX PO) Take by mouth.     temazepam (RESTORIL) 15 MG capsule TAKE 1 CAPSULE BY MOUTH AT BEDTIME AS NEEDED FOR SLEEP 90 capsule 3   fluconazole (DIFLUCAN) 200 MG tablet Take 1 tablet (200 mg total) by mouth daily. (Patient not taking: Reported on 11/01/2023) 10 tablet 0   meclizine (ANTIVERT) 12.5 MG tablet Take 1 tablet (12.5 mg total) by mouth 2 (two) times daily as needed for dizziness. (Patient not taking: Reported on 10/19/2023) 30 tablet 0   omeprazole (PRILOSEC) 40 MG capsule Take 1 capsule (40 mg total) by mouth daily. (Patient not taking: Reported on 11/01/2023) 30 capsule 0   No current facility-administered medications for this visit.     Allergies:   Cephalosporins, Codeine, Macrolides and ketolides, Nitrofurantoin, Tramadol, Doxycycline, Penicillins, and Sulfa  antibiotics   Social History:  The patient  reports that she quit smoking about 46 years ago. Her smoking use included cigarettes. She has never used smokeless tobacco. She reports that she does not drink alcohol and does not use drugs.   Family History:   family history includes Alzheimer's disease in her brother; Dementia in her brother; Diabetes in her daughter; Gout in her brother; Heart attack in her brother and brother; Heart disease in her brother; Melanoma in her daughter; Ovarian cancer in her mother; Stroke in her son.    Review of Systems: Review of Systems  Constitutional: Negative.   HENT: Negative.    Respiratory: Negative.    Cardiovascular: Negative.   Gastrointestinal: Negative.   Musculoskeletal: Negative.   Neurological: Negative.   Psychiatric/Behavioral: Negative.    All other systems reviewed and are negative.    PHYSICAL EXAM: VS:  BP 110/60 (BP Location: Right Arm, Patient Position: Sitting, Cuff Size: Normal)   Pulse 95   Ht 5\' 2"  (1.575 m)   Wt 120 lb 4 oz (54.5 kg)   SpO2 91%   BMI 21.99 kg/m  , BMI Body mass index is 21.99 kg/m. GEN: Well nourished, well developed, in no acute distress HEENT: normal Neck: no JVD, carotid bruits, or masses Cardiac: RRR; no murmurs, rubs, or gallops,no edema  Respiratory:  clear to auscultation bilaterally, normal work of breathing GI: soft, nontender, nondistended, + BS MS: no deformity or atrophy Skin: warm and dry, no rash Neuro:  Strength and sensation are intact  Psych: euthymic mood, full affect   Recent Labs: 07/13/2023: TSH 1.800 10/02/2023: B Natriuretic Peptide 243.1 10/04/2023: Magnesium 2.1 10/13/2023: ALT 14; BUN 12; Creatinine, Ser 0.77; Hemoglobin 8.5; Platelets 373; Potassium 5.1; Sodium 137    Lipid Panel Lab Results  Component Value Date   CHOL 149 07/13/2023   HDL 40 07/13/2023   LDLCALC 64 07/13/2023   TRIG 286 (H) 07/13/2023      Wt Readings from Last 3 Encounters:  11/01/23 120  lb 4 oz (54.5 kg)  10/19/23 122 lb 11.2 oz (55.7 kg)  10/13/23 124 lb (56.2 kg)      ASSESSMENT AND PLAN:  Problem List Items Addressed This Visit       Cardiology Problems   Hypertension associated with diabetes (HCC)   Relevant Orders   EKG 12-Lead (Completed)   Hyperlipidemia associated with type 2 diabetes mellitus (HCC)   Aortic valve stenosis - Primary   Relevant Orders   ECHOCARDIOGRAM COMPLETE   Other Visit Diagnoses       PAD (peripheral artery disease) (HCC)         Stenosis of left carotid artery         Diabetes mellitus type 2 with complications (HCC)          Aortic valve stenosis Recent echocardiogram reviewed from September 2024, estimated to be moderate to severe given mean gradient of 30 Recommended repeat echocardiogram September 2025  Fatigue/weakness Difficult year for her, underwent lung cancer treatment, colon cancer treatment Recent issues with diverticular bleed, anemia Hemoglobin remains in the 8 range More sedentary recently in the setting of the events above  PAD History of aortic atherosclerosis, carotid stenosis, endarterectomy on the left followed by vascular Carotid ultrasound last performed 2019 Will recommend repeat when she does echocardiogram No significant bruits on exam  Hyperlipidemia Cholesterol is at goal on the current lipid regimen. No changes to the medications were made.  Diabetes type 2 with complications A1c 7.1 Medications managed by primary care Family reports she is losing weight, drinking boost daily to supplement   Signed, Dossie Arbour, M.D., Ph.D. [facility] [place], [place] [phone]

## 2023-11-01 ENCOUNTER — Ambulatory Visit: Payer: Medicare Other | Attending: Cardiovascular Disease | Admitting: Cardiovascular Disease

## 2023-11-01 VITALS — BP 110/60 | HR 95 | Ht 62.0 in | Wt 120.2 lb

## 2023-11-01 DIAGNOSIS — I350 Nonrheumatic aortic (valve) stenosis: Secondary | ICD-10-CM | POA: Diagnosis not present

## 2023-11-01 DIAGNOSIS — E1169 Type 2 diabetes mellitus with other specified complication: Secondary | ICD-10-CM | POA: Diagnosis not present

## 2023-11-01 DIAGNOSIS — I5032 Chronic diastolic (congestive) heart failure: Secondary | ICD-10-CM

## 2023-11-01 DIAGNOSIS — I739 Peripheral vascular disease, unspecified: Secondary | ICD-10-CM

## 2023-11-01 DIAGNOSIS — E118 Type 2 diabetes mellitus with unspecified complications: Secondary | ICD-10-CM

## 2023-11-01 DIAGNOSIS — I6522 Occlusion and stenosis of left carotid artery: Secondary | ICD-10-CM

## 2023-11-01 DIAGNOSIS — I152 Hypertension secondary to endocrine disorders: Secondary | ICD-10-CM

## 2023-11-01 DIAGNOSIS — E1159 Type 2 diabetes mellitus with other circulatory complications: Secondary | ICD-10-CM | POA: Diagnosis not present

## 2023-11-01 DIAGNOSIS — E785 Hyperlipidemia, unspecified: Secondary | ICD-10-CM

## 2023-11-01 NOTE — Patient Instructions (Addendum)
Medication Instructions:  No changes  If you need a refill on your cardiac medications before your next appointment, please call your pharmacy.   Lab work: No new labs needed  Testing/Procedures: Echo in sept 2025 Your physician has requested that you have an echocardiogram. Echocardiography is a painless test that uses sound waves to create images of your heart. It provides your doctor with information about the size and shape of your heart and how well your heart's chambers and valves are working.   You may receive an ultrasound enhancing agent through an IV if needed to better visualize your heart during the echo. This procedure takes approximately one hour.  There are no restrictions for this procedure.  This will take place at [address] [facility] [address], [place] [address]  Please note: We ask at that you not bring children with you during ultrasound (echo/ vascular) testing. Due to room size and safety concerns, children are not allowed in the ultrasound rooms during exams. Our front office staff cannot provide observation of children in our lobby area while testing is being conducted. An adult accompanying a patient to their appointment will only be allowed in the ultrasound room at the discretion of the ultrasound technician under special circumstances. We apologize for any inconvenience.  Follow-Up: At [facility], you and your health needs are our priority.  As part of our continuing mission to provide you with exceptional heart care, we have created designated Provider Care Teams.  These Care Teams include your primary Cardiologist (physician) and Advanced Practice Providers (APPs -  Physician Assistants and Nurse Practitioners) who all work together to provide you with the care you need, when you need it.  You will need a follow up appointment in 12 months  Providers on your designated Care Team:   Nicolasa Ducking, NP Eula Listen, PA-C Cadence Fransico Michael,  [place]  COVID-19 Vaccine Information can be found at: [email or web address] For questions related to vaccine distribution or appointments, please email [email or web address] or call [phone]. \

## 2023-11-02 ENCOUNTER — Other Ambulatory Visit: Payer: Self-pay | Admitting: Family Medicine

## 2023-11-02 ENCOUNTER — Inpatient Hospital Stay: Payer: Medicare Other

## 2023-11-02 VITALS — BP 103/61 | HR 90 | Temp 97.7°F | Resp 16

## 2023-11-02 DIAGNOSIS — R911 Solitary pulmonary nodule: Secondary | ICD-10-CM | POA: Diagnosis not present

## 2023-11-02 DIAGNOSIS — D509 Iron deficiency anemia, unspecified: Secondary | ICD-10-CM

## 2023-11-02 DIAGNOSIS — Z85038 Personal history of other malignant neoplasm of large intestine: Secondary | ICD-10-CM | POA: Diagnosis not present

## 2023-11-02 DIAGNOSIS — K5791 Diverticulosis of intestine, part unspecified, without perforation or abscess with bleeding: Secondary | ICD-10-CM | POA: Diagnosis not present

## 2023-11-02 DIAGNOSIS — D500 Iron deficiency anemia secondary to blood loss (chronic): Secondary | ICD-10-CM | POA: Diagnosis not present

## 2023-11-02 MED ORDER — IRON SUCROSE 20 MG/ML IV SOLN
200.0000 mg | Freq: Once | INTRAVENOUS | Status: AC
  Administered 2023-11-02: 200 mg via INTRAVENOUS
  Filled 2023-11-02: qty 10

## 2023-11-02 MED ORDER — SIMETHICONE 125 MG PO CHEW: 125.0000 mg | CHEWABLE_TABLET | Freq: Four times a day (QID) | ORAL | 0 refills | Status: DC | PRN

## 2023-11-02 MED ORDER — SODIUM CHLORIDE 0.9% FLUSH
10.0000 mL | Freq: Once | INTRAVENOUS | Status: AC | PRN
  Administered 2023-11-02: 10 mL
  Filled 2023-11-02: qty 10

## 2023-11-03 ENCOUNTER — Other Ambulatory Visit: Payer: Self-pay | Admitting: Emergency Medicine

## 2023-11-03 ENCOUNTER — Telehealth: Payer: Self-pay | Admitting: Emergency Medicine

## 2023-11-03 DIAGNOSIS — I6522 Occlusion and stenosis of left carotid artery: Secondary | ICD-10-CM

## 2023-11-03 NOTE — Telephone Encounter (Signed)
Called patient and left detailed message with the following from Dr. Mariah Milling.  Seen in the clinic December 16, echocardiogram ordered September 2025  Would recommend we add carotid ultrasound given history of carotid endarterectomy, last carotid ultrasound 2019  Thx  TG   Message sent to scheduling.

## 2023-11-04 ENCOUNTER — Telehealth: Payer: Self-pay | Admitting: Family Medicine

## 2023-11-04 ENCOUNTER — Inpatient Hospital Stay: Payer: Medicare Other

## 2023-11-04 VITALS — BP 135/75 | HR 82 | Temp 97.3°F | Resp 18

## 2023-11-04 DIAGNOSIS — Z85038 Personal history of other malignant neoplasm of large intestine: Secondary | ICD-10-CM | POA: Diagnosis not present

## 2023-11-04 DIAGNOSIS — R911 Solitary pulmonary nodule: Secondary | ICD-10-CM | POA: Diagnosis not present

## 2023-11-04 DIAGNOSIS — D500 Iron deficiency anemia secondary to blood loss (chronic): Secondary | ICD-10-CM | POA: Diagnosis not present

## 2023-11-04 DIAGNOSIS — K5791 Diverticulosis of intestine, part unspecified, without perforation or abscess with bleeding: Secondary | ICD-10-CM | POA: Diagnosis not present

## 2023-11-04 DIAGNOSIS — D509 Iron deficiency anemia, unspecified: Secondary | ICD-10-CM

## 2023-11-04 MED ORDER — IRON SUCROSE 20 MG/ML IV SOLN
200.0000 mg | Freq: Once | INTRAVENOUS | Status: AC
  Administered 2023-11-04: 200 mg via INTRAVENOUS

## 2023-11-04 NOTE — Telephone Encounter (Signed)
Medication Refill -  Most Recent Primary Care Visit:  Provider: Merita Norton T  Department: [facility]  Visit Type: HOSPITAL FU  Date: 10/13/2023  Medication:  simethicone (MYLICON) 125 MG chewable tablet 120 tablet    Has the patient contacted their pharmacy? yes (Agent: If yes, when and what did the pharmacy advise?)contact pcp Shows received 12/07 but pharmacy says they don't have it  Is this the correct pharmacy for this prescription? yes  This is the patient's preferred pharmacy:  [facility] [address] [place] - [address] [place] [address] Phone: [phone] Fax: [phone]   Has the prescription been filled recently? no  Is the patient out of the medication? yes  Has the patient been seen for an appointment in the last year OR does the patient have an upcoming appointment? yes  Can we respond through MyChart? yes  Agent: Please be advised that Rx refills may take up to 3 business days. We ask that you follow-up with your pharmacy.

## 2023-11-04 NOTE — Patient Instructions (Signed)
Iron Sucrose Injection What is this medication? IRON SUCROSE (EYE ern SOO krose) treats low levels of iron (iron deficiency anemia) in people with kidney disease. Iron is a mineral that plays an important role in making red blood cells, which carry oxygen from your lungs to the rest of your body. This medicine may be used for other purposes; ask your health care provider or pharmacist if you have questions. COMMON BRAND NAME(S): Venofer What should I tell my care team before I take this medication? They need to know if you have any of these conditions: Anemia not caused by low iron levels Heart disease High levels of iron in the blood Kidney disease Liver disease An unusual or allergic reaction to iron, other medications, foods, dyes, or preservatives Pregnant or trying to get pregnant Breastfeeding How should I use this medication? This medication is for infusion into a vein. It is given in a hospital or clinic setting. Talk to your care team about the use of this medication in children. While this medication may be prescribed for children as young as 2 years for selected conditions, precautions do apply. Overdosage: If you think you have taken too much of this medicine contact a poison control center or emergency room at once. NOTE: This medicine is only for you. Do not share this medicine with others. What if I miss a dose? Keep appointments for follow-up doses. It is important not to miss your dose. Call your care team if you are unable to keep an appointment. What may interact with this medication? Do not take this medication with any of the following: Deferoxamine Dimercaprol Other iron products This medication may also interact with the following: Chloramphenicol Deferasirox This list may not describe all possible interactions. Give your health care provider a list of all the medicines, herbs, non-prescription drugs, or dietary supplements you use. Also tell them if you smoke,  drink alcohol, or use illegal drugs. Some items may interact with your medicine. What should I watch for while using this medication? Visit your care team regularly. Tell your care team if your symptoms do not start to get better or if they get worse. You may need blood work done while you are taking this medication. You may need to follow a special diet. Talk to your care team. Foods that contain iron include: whole grains/cereals, dried fruits, beans, or peas, leafy green vegetables, and organ meats (liver, kidney). What side effects may I notice from receiving this medication? Side effects that you should report to your care team as soon as possible: Allergic reactions--skin rash, itching, hives, swelling of the face, lips, tongue, or throat Low blood pressure--dizziness, feeling faint or lightheaded, blurry vision Shortness of breath Side effects that usually do not require medical attention (report to your care team if they continue or are bothersome): Flushing Headache Joint pain Muscle pain Nausea Pain, redness, or irritation at injection site This list may not describe all possible side effects. Call your doctor for medical advice about side effects. You may report side effects to FDA at 1-800-FDA-1088. Where should I keep my medication? This medication is given in a hospital or clinic. It will not be stored at home. NOTE: This sheet is a summary. It may not cover all possible information. If you have questions about this medicine, talk to your doctor, pharmacist, or health care provider.  2024 Elsevier/Gold Standard (2023-04-09 00:00:00)

## 2023-11-04 NOTE — Telephone Encounter (Signed)
[facility] called and spoke to [place], Pensions consultant about the refill(s) simethicone requested. Advised it was sent on 11/02/23. She says because it's OTC, the insurance will not pay for it. If the patient wants it filled it will cost her $9. Johnice Nuffer, daughter called and advised of the above. She says she wasn't aware and they bought some gas pills they will try and they will also go buy the simethicone.

## 2023-11-08 ENCOUNTER — Telehealth: Payer: Self-pay | Admitting: Cardiovascular Disease

## 2023-11-08 NOTE — Telephone Encounter (Signed)
Left voicemail, pt needs to have a carotid ultrasound scheduled.

## 2023-11-08 NOTE — Telephone Encounter (Signed)
-----   Message from Nurse Tarri Abernethy sent at 11/03/2023  7:46 AM EST ----- Can you schedule this patient for a carotid US please?  Thank you, Elon Jester  Seen in the clinic December 16, echocardiogram ordered September 2025  Would recommend we add carotid ultrasound given history of carotid endarterectomy, last carotid ultrasound 2019  Thx  TG

## 2023-11-16 ENCOUNTER — Encounter: Payer: Self-pay | Admitting: Family Medicine

## 2023-11-17 DIAGNOSIS — I2699 Other pulmonary embolism without acute cor pulmonale: Secondary | ICD-10-CM

## 2023-11-17 HISTORY — DX: Other pulmonary embolism without acute cor pulmonale: I26.99

## 2023-11-23 ENCOUNTER — Telehealth: Payer: Self-pay | Admitting: *Deleted

## 2023-11-23 ENCOUNTER — Ambulatory Visit
Admission: RE | Admit: 2023-11-23 | Discharge: 2023-11-23 | Disposition: A | Discharge status: Home / Self Care | Payer: Medicare Other | Source: Ambulatory Visit | Attending: Oncology | Admitting: Oncology

## 2023-11-23 DIAGNOSIS — C189 Malignant neoplasm of colon, unspecified: Secondary | ICD-10-CM | POA: Diagnosis not present

## 2023-11-23 DIAGNOSIS — R911 Solitary pulmonary nodule: Secondary | ICD-10-CM | POA: Insufficient documentation

## 2023-11-23 DIAGNOSIS — C3490 Malignant neoplasm of unspecified part of unspecified bronchus or lung: Secondary | ICD-10-CM | POA: Diagnosis not present

## 2023-11-23 DIAGNOSIS — I2699 Other pulmonary embolism without acute cor pulmonale: Secondary | ICD-10-CM | POA: Diagnosis not present

## 2023-11-23 MED ORDER — IOHEXOL 300 MG/ML  SOLN
100.0000 mL | Freq: Once | INTRAMUSCULAR | Status: AC | PRN
  Administered 2023-11-23: 100 mL via INTRAVENOUS

## 2023-11-23 NOTE — Telephone Encounter (Signed)
 Called report  IMPRESSION: 1. Significant right-sided pulmonary emboli involving both the right upper lobe and right lower lobe pulmonary arteries and their branches. No definite left-sided pulmonary emboli are identified. 2. Patchy peripheral airspace opacity in the right lung probably related to the patient's pulmonary emboli with small peripheral pulmonary infarcts. 3. Extensive airspace opacification in the left lung and loss of volume possibly related to extensive radiation pneumonitis/fibrosis. No discrete mass is identified. 4. Interval enlargement of mediastinal and hilar lymph nodes. 5. Indeterminate 4 mm pulmonary nodule in the right lower lobe. Attention on future imaging studies is suggested. 6. Fatty liver. 7. Aortic atherosclerosis.   Aortic Atherosclerosis (ICD10-I70.0).   These results will be called to the ordering clinician or representative by the Radiologist Assistant, and communication documented in the PACS or [employer].     Electronically Signed   By: MYRTIS Stammer M.D.   On: 11/23/2023 14:21

## 2023-11-24 ENCOUNTER — Ambulatory Visit: Payer: Medicare Other | Admitting: Family Medicine

## 2023-11-24 ENCOUNTER — Encounter: Payer: Self-pay | Admitting: Oncology

## 2023-11-25 ENCOUNTER — Inpatient Hospital Stay: Payer: Medicare Other | Attending: Oncology | Admitting: Oncology

## 2023-11-25 ENCOUNTER — Encounter: Payer: Self-pay | Admitting: Family Medicine

## 2023-11-25 ENCOUNTER — Encounter: Payer: Self-pay | Admitting: Oncology

## 2023-11-25 ENCOUNTER — Inpatient Hospital Stay: Payer: Medicare Other

## 2023-11-25 ENCOUNTER — Ambulatory Visit (INDEPENDENT_AMBULATORY_CARE_PROVIDER_SITE_OTHER): Payer: Medicare Other | Admitting: Family Medicine

## 2023-11-25 VITALS — BP 130/78 | HR 104 | Temp 98.0°F | Resp 16 | Ht 62.0 in | Wt 116.8 lb

## 2023-11-25 VITALS — BP 113/52 | HR 117 | Ht 62.0 in | Wt 117.6 lb

## 2023-11-25 DIAGNOSIS — D500 Iron deficiency anemia secondary to blood loss (chronic): Secondary | ICD-10-CM | POA: Insufficient documentation

## 2023-11-25 DIAGNOSIS — D509 Iron deficiency anemia, unspecified: Secondary | ICD-10-CM

## 2023-11-25 DIAGNOSIS — F339 Major depressive disorder, recurrent, unspecified: Secondary | ICD-10-CM | POA: Diagnosis not present

## 2023-11-25 DIAGNOSIS — F5101 Primary insomnia: Secondary | ICD-10-CM

## 2023-11-25 DIAGNOSIS — Z923 Personal history of irradiation: Secondary | ICD-10-CM | POA: Diagnosis not present

## 2023-11-25 DIAGNOSIS — I2699 Other pulmonary embolism without acute cor pulmonale: Secondary | ICD-10-CM | POA: Insufficient documentation

## 2023-11-25 DIAGNOSIS — R54 Age-related physical debility: Secondary | ICD-10-CM | POA: Insufficient documentation

## 2023-11-25 DIAGNOSIS — F419 Anxiety disorder, unspecified: Secondary | ICD-10-CM | POA: Insufficient documentation

## 2023-11-25 DIAGNOSIS — Z634 Disappearance and death of family member: Secondary | ICD-10-CM | POA: Diagnosis not present

## 2023-11-25 DIAGNOSIS — E1165 Type 2 diabetes mellitus with hyperglycemia: Secondary | ICD-10-CM

## 2023-11-25 DIAGNOSIS — E1159 Type 2 diabetes mellitus with other circulatory complications: Secondary | ICD-10-CM

## 2023-11-25 DIAGNOSIS — Z87891 Personal history of nicotine dependence: Secondary | ICD-10-CM | POA: Insufficient documentation

## 2023-11-25 DIAGNOSIS — Z85038 Personal history of other malignant neoplasm of large intestine: Secondary | ICD-10-CM | POA: Insufficient documentation

## 2023-11-25 DIAGNOSIS — G4700 Insomnia, unspecified: Secondary | ICD-10-CM | POA: Insufficient documentation

## 2023-11-25 DIAGNOSIS — E119 Type 2 diabetes mellitus without complications: Secondary | ICD-10-CM | POA: Insufficient documentation

## 2023-11-25 DIAGNOSIS — I152 Hypertension secondary to endocrine disorders: Secondary | ICD-10-CM

## 2023-11-25 DIAGNOSIS — Z7984 Long term (current) use of oral hypoglycemic drugs: Secondary | ICD-10-CM

## 2023-11-25 DIAGNOSIS — Z79899 Other long term (current) drug therapy: Secondary | ICD-10-CM | POA: Diagnosis not present

## 2023-11-25 DIAGNOSIS — K5791 Diverticulosis of intestine, part unspecified, without perforation or abscess with bleeding: Secondary | ICD-10-CM | POA: Diagnosis not present

## 2023-11-25 DIAGNOSIS — R911 Solitary pulmonary nodule: Secondary | ICD-10-CM | POA: Insufficient documentation

## 2023-11-25 LAB — CBC WITH DIFFERENTIAL/PLATELET
Abs Immature Granulocytes: 0.03 10*3/uL (ref 0.00–0.07)
Basophils Absolute: 0 10*3/uL (ref 0.0–0.1)
Basophils Relative: 0 %
Eosinophils Absolute: 0.1 10*3/uL (ref 0.0–0.5)
Eosinophils Relative: 1 %
HCT: 40.6 % (ref 36.0–46.0)
Hemoglobin: 12.4 g/dL (ref 12.0–15.0)
Immature Granulocytes: 0 %
Lymphocytes Relative: 19 %
Lymphs Abs: 1.5 10*3/uL (ref 0.7–4.0)
MCH: 26.7 pg (ref 26.0–34.0)
MCHC: 30.5 g/dL (ref 30.0–36.0)
MCV: 87.3 fL (ref 80.0–100.0)
Monocytes Absolute: 0.5 10*3/uL (ref 0.1–1.0)
Monocytes Relative: 7 %
Neutro Abs: 6.1 10*3/uL (ref 1.7–7.7)
Neutrophils Relative %: 73 %
Platelets: 315 10*3/uL (ref 150–400)
RBC: 4.65 MIL/uL (ref 3.87–5.11)
RDW: 17.2 % — ABNORMAL HIGH (ref 11.5–15.5)
WBC: 8.2 10*3/uL (ref 4.0–10.5)
nRBC: 0 % (ref 0.0–0.2)

## 2023-11-25 LAB — IRON AND TIBC
Iron: 27 ug/dL — ABNORMAL LOW (ref 28–170)
Saturation Ratios: 11 % (ref 10.4–31.8)
TIBC: 255 ug/dL (ref 250–450)
UIBC: 228 ug/dL

## 2023-11-25 LAB — FERRITIN: Ferritin: 559 ng/mL — ABNORMAL HIGH (ref 11–307)

## 2023-11-25 NOTE — Progress Notes (Signed)
 Established Patient Office Visit  Introduced to nurse practitioner role and practice setting.  All questions answered.  Discussed provider/patient relationship and expectations.   Subjective   Patient ID: [name], female    DOB: 28-Aug-1929  Age: 89 y.o. MRN: [id number]  Chief Complaint  Patient presents with   Medical Management of Chronic Issues    6 week follow-up    89 year old female with a history of chronic diseases, presented for a six-week follow-up post starting iron  infusions and hospitalization d/t melena. Presents with daughter Nathanel.   Fatigue - persistent -  some improvement noted over the past week. Daughter states slow recovery from multiple health issues experienced over the past year.  Oncology/Hem- appointment at 2:45pm today - Hx of lung nodule, recent lung CT showed - PE R lobe - may be started in Eliquis .  Insomnia - struggling to fall back asleep, will wake up through night. Temazepam   was prescribed to pt by previous PCP, Emilio, FNP. States intermittently takes.   Mood - feeling down, was started on paroxetine  at previous visit - does not feel it has helped much, but willing to continue med for another month. Son is currently in the hospital, which has been a source of stress.  Belching -  frequent burping, especially after meals. Taking Gas-X (simethicone ) two to three times a day after meals, led to some improvement.  The patient also reported physical exhaustion after walking short distances within the house, using a cane for support. The patient has been trying to do some light exercises at home, but the lack of energy has been a significant hindrance.  HTN - historically controlled with amlodipine  and hydrochlorothiazide  - which has been held for several months now given lower BP readings.   DM- last A1c 7.1 - diet controlled.  Daughter and daughter-in-law live very close and provide care/support.    The patient expressed a desire for more energy  and a return to normalcy, including outdoor activities like gardening, which are currently limited due to physical exhaustion and the cold weather.        11/25/2023    2:08 PM 10/13/2023    1:22 PM 02/24/2023   10:29 AM  Depression screen PHQ 2/9  Decreased Interest 2 3 0  Down, Depressed, Hopeless 1 2 0  PHQ - 2 Score 3 5 0  Altered sleeping 3 2 1   Tired, decreased energy 3 3 1   Change in appetite 2 3 0  Feeling bad or failure about yourself  2 2 0  Trouble concentrating 2 2 0  Moving slowly or fidgety/restless 2 2 0  Suicidal thoughts 0 0 0  PHQ-9 Score 17 19 2   Difficult doing work/chores Very difficult Not difficult at all Not difficult at all       11/25/2023    2:09 PM  GAD 7 : Generalized Anxiety Score  Nervous, Anxious, on Edge 0  Control/stop worrying 2  Worry too much - different things 2  Trouble relaxing 3  Restless 1  Easily annoyed or irritable 1  Afraid - awful might happen 0  Total GAD 7 Score 9  Anxiety Difficulty Very difficult   Review of Systems  All other systems reviewed and are negative.   Negative unless indicated in HPI   Objective:     BP (!) 113/52 (BP Location: Left Arm, Patient Position: Sitting, Cuff Size: Normal)   Pulse (!) 117   Ht 5' 2 (1.575 m)   Wt 117  lb 9.6 oz (53.3 kg)   SpO2 98%   BMI 21.51 kg/m    Physical Exam Constitutional:      Appearance: Normal appearance. She is normal weight. She is not ill-appearing, toxic-appearing or diaphoretic.  HENT:     Head: Normocephalic.     Mouth/Throat:     Mouth: Mucous membranes are moist.     Pharynx: Oropharynx is clear.  Eyes:     Extraocular Movements: Extraocular movements intact.     Pupils: Pupils are equal, round, and reactive to light.  Cardiovascular:     Rate and Rhythm: Tachycardia present.     Pulses: Normal pulses.     Heart sounds: Murmur heard.     No friction rub. No gallop.  Pulmonary:     Effort: No respiratory distress.     Breath sounds: No  stridor. No wheezing, rhonchi or rales.     Comments: States does get fatigued easily with ADLS and ambulating around house Chest:     Chest wall: No tenderness.  Musculoskeletal:     Right lower leg: No edema.     Left lower leg: No edema.     Comments: Ambulates with rolling walker  Skin:    General: Skin is warm and dry.     Capillary Refill: Capillary refill takes less than 2 seconds.  Neurological:     Mental Status: She is alert. Mental status is at baseline.     Cranial Nerves: Cranial nerves 2-12 are intact.  Psychiatric:        Attention and Perception: Attention and perception normal.        Mood and Affect: Mood is depressed. Affect is tearful.        Speech: Speech normal.        Behavior: Behavior is withdrawn. Behavior is cooperative.        Thought Content: Thought content normal.        Cognition and Memory: Cognition normal. She exhibits impaired recent memory.        Judgment: Judgment normal.     No results found for any visits on 11/25/23.    The ASCVD Risk score (Arnett DK, et al., 2019) failed to calculate for the following reasons:   The 2019 ASCVD risk score is only valid for ages 86 to 75   Risk score cannot be calculated because patient has a medical history suggesting prior/existing ASCVD    Assessment & Plan:  Frail elderly Assessment & Plan: Chronic based on past year of hospitalizations and physical decline Declines-  home PT/OT/HH referral at this time Daughter and DIL are assisting pt - per pt's preference - continues to want to be independent. Use of walker at home Reports poor endurance with ADLs; poor exercise intolerance Previous PCP, Emilio, FNP - referral to Palliative care - pt an daughter unsure if they were contacted - will review.  Life alert bracelet getting sent to pt this week.      Iron  deficiency anemia due to chronic blood loss Assessment & Plan: Chronic, Managed by hem/onc Receiving iron  infusions - will hold on labs  today given their current management - Denies any recent GI bleed or melena since start of transfusions.   Primary insomnia Assessment & Plan: Chronic, variable Today expressed worsening Previously prescribed temazepam  nightly for insomnia needs Expressed sleep hygiene - limiting over hydrating before bed Set bed time schedule May try melatonin OTC If can't fall asleep get out of bed and go do activity Write  down thoughts   Depression, recurrent (HCC) Assessment & Plan: Reports feeling down and irritable. Recently started on paroxetine  (Paxil ) at end of November 2024. Pt unsure if it is helping. Has a lot going on to cause daily distress, including rehabilitation and son is in hospital -Encourage pt to continue paroxetine . -Can reevaluate efficacy in 1-2 months, if needed -Offer [facility] referral and outside resource support - pt declined - PHQ9 =17, previous,19 in November 2024.   Type 2 diabetes mellitus with hyperglycemia, without long-term current use of insulin  [facility]) Assessment & Plan: Chronic, managed, stable - last A1c = 7.1 - 10/13/23 Continues on glipizide  5mg  daily with meals Continue to recommend balanced, lower carb meals. Smaller meal size, adding snacks. Choosing water  as drink of choice and increasing purposeful exercise. Due for optho exam - pt to schedule     Hypertension associated with diabetes (HCC) Assessment & Plan: Chronic, stable - not on medications at this time Continue to hold Amlodipine  and HCTZ At goal today, denies symptoms - Discussed stroke symptoms and cardiac - when to present to ED - monitor at home, Goal SBP<130; DBP<80     Lung CT recently - found to have R lung lobe PE - pt to see oncology physician, Dr.Finnegan, later today - may be started on Eliquis  - will follow up.    Given pt's DM and HTN currently managed - pt may return for general routine labs around June 2025 for recheck - pt preferred to return for annual physical in August.  Discussed may schedule anytime for acute needs or changes to chronic disease mgmt.    Return in about 7 months (around 06/24/2024) for annual physical.   I, Curtis DELENA Boom, FNP, have reviewed all documentation for this visit. The documentation on 11/25/23 for the exam, diagnosis, procedures, and orders are all accurate and complete.   Curtis DELENA Boom, FNP

## 2023-11-25 NOTE — Assessment & Plan Note (Addendum)
 Chronic based on past year of hospitalizations and physical decline Declines-  home PT/OT/HH referral at this time Daughter and DIL are assisting pt - per pt's preference - continues to want to be independent. Use of walker at home Reports poor endurance with ADLs; poor exercise intolerance Previous PCP, Emilio, FNP - referral to Palliative care - pt an daughter unsure if they were contacted - will review.  Life alert bracelet getting sent to pt this week.  Encouraged daily joyful hobbies and skills Suggested senior center activities - pt decline

## 2023-11-25 NOTE — Assessment & Plan Note (Addendum)
 Reports feeling down and irritable. Recently started on paroxetine  (Paxil ) at end of November 2024. Pt unsure if it is helping. Has a lot going on to cause daily distress, including rehabilitation and son is in hospital -Encourage pt to continue paroxetine . -Can reevaluate efficacy in 1-2 months, if needed -Offer [facility] referral and outside resource support - pt declined - PHQ9 =17, previous,19 in November 2024.

## 2023-11-25 NOTE — Progress Notes (Signed)
 [facility]  [phone] [phone]  ID: [name] Fire OB: 1929/10/27  MR#: [id number]  RDW#:[id number]  Patient Care Team: Wellington Curtis LABOR, FNP as PCP - General (Family Medicine) Myrna Adine Anes, MD as Consulting Physician (Ophthalmology) Dasher, Alm LABOR, MD (Dermatology) Jacobo Evalene PARAS, MD as Consulting Physician (Oncology)  CHIEF COMPLAINT: History of stage I colon cancer, history left lower lobe lung nodule presumed malignant, iron  deficiency anemia secondary to diverticular bleed.  INTERVAL HISTORY: Patient returns to clinic today as an add-on for further evaluation and discussion of her CT scan results.  She continues to have fatigue, but otherwise feels well.  She has no neurological plaints.  She denies any recent fevers.  She has good appetite and denies weight loss.  She has no chest pain, shortness of breath, cough, or hemoptysis.  She denies any nausea, vomiting, constipation, or diarrhea.  She has no further melena or hematochezia.  She has no urinary complaints.  Patient offers no further specific complaints today.  REVIEW OF SYSTEMS:   Review of Systems  Constitutional:  Positive for malaise/fatigue. Negative for fever and weight loss.  Respiratory: Negative.  Negative for cough, hemoptysis and shortness of breath.   Cardiovascular: Negative.  Negative for chest pain and leg swelling.  Gastrointestinal: Negative.  Negative for abdominal pain, blood in stool and melena.  Genitourinary: Negative.  Negative for hematuria.  Musculoskeletal: Negative.  Negative for back pain.  Skin: Negative.  Negative for rash.  Neurological: Negative.  Negative for dizziness, focal weakness, weakness and headaches.  Psychiatric/Behavioral: Negative.  The patient is not nervous/anxious.     As per HPI. Otherwise, a complete review of systems is negative.  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   a.) CT chest 03/09/2023: fusiform dilatation measuring 4.3 cm   Basal cell carcinoma (BCC) of left medial cheek    a.) s/p Mohs surgery 07/13/2022 at [facility]   Bilateral carotid artery disease [facility])    a.) doppler 04/03/2007: 50-75% RICA; b.) s/p LEFT CEA with CorMatrix patch angioplasty 01/18/2015; c.) doppler 11/02/2016: 40-59% RICA; d.) doppler 01/10/2018: 1-39% RICA   Diverticulosis 08/05/2015   Eczema of both upper extremities 06/10/2021   Heart failure with preserved left ventricular function (HFpEF) (HCC)    a.) TTE 05/17/2017: EF >55%, mild LVH, mild LAE, mild panval regurg; b.) TTE 06/27/2018 : EF >55%, mod LVH, mild LAE, mild panval regurg'; G1dd; c.) TTE 12/07/2019: EF >55%, mild LVH, mild MAC, mild panval regurg; d.) TTE 01/27/2021: EF >55%, mod LVH, triv TR, mild AR/MR, G1DD   Heart murmur    History of right cataract extraction    Hyperlipidemia    Hypertension    Insomnia    a.) on BZO (temazepam ) PRN   Irritable bowel syndrome with diarrhea 04/20/2017   LBP (low back pain) 08/05/2015   Melanoma (HCC)    a.) s/p Mohs surgery 02/06/2020 - LEFT medial brow --> V to Y advancement flap  Melena    Non-small cell lung cancer (NSCLC) (HCC) 03/09/2023   a.) CT chest 03/09/2023 --> superior segment LLL --> 2.1 x 1.7 x 2.1 cm mixed ground-glass and subpleural nodule extending to pleura with irreg margins --> concerning for primary bronchogenic carcinoma rather than metastatic disease; b.) 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 (60 Gy over 6  fractions)   OAB (overactive bladder)    PONV (postoperative nausea and vomiting)    a.) single episode following cataract surgery   PSVT (paroxysmal supraventricular tachycardia) (HCC) 02/22/2015   a.) holter 02/22/2015: 6 runs with longest lasting 10 beats   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   a.) MRI brain 04/02/2007 - subacute RIGHT pontine CVA   Stroke (HCC) 12/16/2014   a.) MRI brain 12/16/2014: punctate foci of restricted diffusion x 5 consistent with microembolic infarctions affecting the RIGHT lateral medulla, LEFT temporal lobe, 2 foci in the RIGHT thalamus, and LEFT inferior  frontal lobe/basal ganglia --> distribution would suggest cardiac or ascending aortic source   T2DM (type 2 diabetes mellitus) (HCC)    Tubular adenoma of colon     PAST SURGICAL HISTORY: Past Surgical History:  Procedure Laterality Date   APPENDECTOMY     BREAST BIOPSY Right    neg   BREAST SURGERY     1980's   CAROTID ENDARTERECTOMY Left 01/18/2015   Procedure: CAROTID ENDARTERECTOMY; Location: [facility]; Surgeon: Cordella Shawl, MD   CATARACT EXTRACTION Right 2012   CHOLECYSTECTOMY  06/27/2012   COLONOSCOPY N/A 10/05/2023   Procedure: COLONOSCOPY;  Surgeon: Toledo, Ladell POUR, MD;  Location: [facility] ENDOSCOPY;  Service: Gastroenterology;  Laterality: N/A;   COLONOSCOPY WITH PROPOFOL  N/A 02/17/2023   Procedure: COLONOSCOPY WITH PROPOFOL ;  Surgeon: Maryruth Ole DASEN, MD;  Location: [facility] ENDOSCOPY;  Service: Endoscopy;  Laterality: N/A;   DECOMPRESSIVE LUMBAR LAMINECTOMY LEVEL 1  1950   ESOPHAGEAL BRUSHING  10/03/2023   Procedure: ESOPHAGEAL BRUSHING;  Surgeon: Aundria, Ladell POUR, MD;  Location: [facility] ENDOSCOPY;  Service: Gastroenterology;;   ESOPHAGOGASTRODUODENOSCOPY N/A 10/03/2023   Procedure: ESOPHAGOGASTRODUODENOSCOPY (EGD);  Surgeon: Toledo, Ladell POUR, MD;  Location: [facility] ENDOSCOPY;  Service: Gastroenterology;  Laterality: N/A;   ESOPHAGOGASTRODUODENOSCOPY (EGD) WITH PROPOFOL  N/A 02/16/2023    Procedure: ESOPHAGOGASTRODUODENOSCOPY (EGD) WITH PROPOFOL ;  Surgeon: Maryruth Ole DASEN, MD;  Location: [facility] ENDOSCOPY;  Service: Endoscopy;  Laterality: N/A;   MELANOMA EXCISION Left 02/06/2020   Procedure: MOHS SURGERY WITH V TO Y ADVANCEMENT FLAP (LEFT MEDIAL EYEBROW); Location: [facility]   MOHS SURGERY Left 07/13/2022   Procedure: MOHS SURGERY (BCC LEFT MEDIAL CHEEK); Location: [facility]    FAMILY HISTORY: Family History  Problem Relation Age of Onset   Ovarian cancer Mother    Heart disease Brother    Heart attack Brother    Gout Brother    Alzheimer's disease Brother    Dementia Brother    Heart attack Brother    Diabetes Daughter    Melanoma Daughter    Stroke Son    Breast cancer Neg Hx     ADVANCED DIRECTIVES (Y/N):  N  HEALTH MAINTENANCE: Social History   Tobacco Use   Smoking status: Former    Current packs/day: 0.00    Types: Cigarettes    Quit date: 11/15/1976    Years since quitting: 47.0   Smokeless tobacco: Never  Vaping Use   Vaping status: Never Used  Substance Use Topics   Alcohol use: No   Drug use: No     Colonoscopy:  PAP:  Bone density:  Lipid panel:  Allergies  Allergen Reactions   Cephalosporins     upset stomach   Codeine    Macrolides And Ketolides     upset stomach Other reaction(s): Unknown upset stomach   Nitrofurantoin     Other reaction(s): Other (See Comments)   Tramadol Nausea Only   Doxycycline  Rash   Penicillins Other (See Comments) and Rash   Sulfa Antibiotics Rash    Current Outpatient Medications  Medication Sig Dispense Refill   atorvastatin  (LIPITOR) 40 MG tablet Take 1 tablet (40 mg total) by mouth daily. 90 tablet 3   cholecalciferol  (VITAMIN D ) 1000 UNITS tablet Take 1,000 Units by mouth daily.      glipiZIDE  (GLUCOTROL  XL) 5 MG 24 hr tablet Take 1 tablet (5 mg total) by mouth daily with breakfast. 90 tablet 3   Polyethylene Glycol 3350  (MIRALAX  PO) Take by mouth.     simethicone  (MYLICON) 125 MG chewable  tablet Chew 1 tablet (125 mg total) by mouth every 6 (six) hours as needed for flatulence. 120 tablet 0   temazepam  (RESTORIL ) 15 MG capsule TAKE 1 CAPSULE BY MOUTH AT BEDTIME AS NEEDED FOR SLEEP 90 capsule 3   PARoxetine  (PAXIL ) 10 MG tablet Take 1 tablet (10 mg total) by mouth daily. (Patient not taking: Reported on 11/25/2023) 90 tablet 0   No current facility-administered medications for this visit.    OBJECTIVE: Vitals:   11/25/23 1432  BP: 130/78  Pulse: (!) 104  Resp: 16  Temp: 98 F (36.7 C)  SpO2: 97%     Body mass index is 21.36 kg/m.    ECOG FS:1 - Symptomatic but completely ambulatory  General: Well-developed, well-nourished, no acute distress. Eyes: Pink conjunctiva, anicteric sclera. HEENT: Normocephalic, moist mucous membranes. Lungs: No audible wheezing or coughing. Heart: Regular rate and rhythm. Abdomen: Soft, nontender, no obvious distention. Musculoskeletal: No edema, cyanosis, or clubbing. Neuro: Alert, answering all questions appropriately. Cranial nerves grossly intact. Skin: No rashes or petechiae noted. Psych: Normal affect.  LAB RESULTS:  Lab Results  Component Value Date   NA 137 10/13/2023   K 5.1 10/13/2023   CL 101 10/13/2023   CO2 21 10/13/2023   GLUCOSE 212 (H) 10/13/2023   BUN 12 10/13/2023   CREATININE 0.77 10/13/2023   CALCIUM  9.0 10/13/2023   PROT 6.4 10/13/2023   ALBUMIN 3.4 (L) 10/13/2023   AST 19 10/13/2023   ALT 14 10/13/2023   ALKPHOS 149 (H) 10/13/2023   BILITOT 0.4 10/13/2023   GFRNONAA >60 10/06/2023   GFRAA 82 05/14/2020    Lab Results  Component Value Date   WBC 8.2 11/25/2023   NEUTROABS 6.1 11/25/2023   HGB 12.4 11/25/2023   HCT 40.6 11/25/2023   MCV 87.3 11/25/2023   PLT 315 11/25/2023   Lab Results  Component Value Date   IRON  27 (L) 11/25/2023   TIBC 255 11/25/2023   IRONPCTSAT 11 11/25/2023   Lab Results  Component Value Date   FERRITIN 559 (H) 11/25/2023     STUDIES: CT CHEST W  CONTRAST Result Date: 11/23/2023 CLINICAL DATA:  Follow-up lung cancer. Status post radiation therapy. History of colon cancer also. * Tracking Code: BO * EXAM: CT CHEST WITH CONTRAST TECHNIQUE: Multidetector CT imaging of the chest was performed during intravenous contrast administration. RADIATION DOSE REDUCTION: This exam was performed according to the departmental dose-optimization program which includes automated exposure control, adjustment of the mA and/or kV according to patient size and/or use of iterative reconstruction technique. CONTRAST:  OMNIPAQUE  IOHEXOL  300 MG/ML  SOLN COMPARISON:  Chest CT 03/09/2023 and PET-CT 04/01/2023 FINDINGS: Cardiovascular: The heart is normal in size. No pericardial effusion. Stable tortuosity and ectasia of the thoracic aorta. Stable advanced and progressive atherosclerotic changes along the aortic arch. Stable three-vessel coronary artery calcifications and stable extensive calcifications around the aortic valve and mitral valve annulus. Significant left-sided pulmonary emboli are noted involving both the right upper lobe and right lower lobe pulmonary arteries and their branches. No definite left-sided pulmonary emboli are identified. Mediastinum/Nodes: Interval enlargement mediastinal hilar lymph nodes. 9 mm right hilar node on image number 66/2. 11 mm left hilar node on image number 49/2. 5 mm prevascular lymph nodes. Lungs/Pleura: Extensive airspace opacification in the left lung and loss of volume possibly related to extensive radiation pneumonitis/fibrosis. No discrete mass is identified. Patchy peripheral airspace opacity in the right lung probably related to the patient's pulmonary emboli with small peripheral pulmonary infarcts. No pleural effusion. Indeterminate 4 mm pulmonary nodule in the right lower lobe on image number 70/4. Attention on future imaging studies is suggested. Upper Abdomen: No significant upper abdominal findings. Diffuse fatty  infiltration of the liver is noted. Stable aortic calcifications. No adrenal gland lesions. Musculoskeletal: No significant bony findings. IMPRESSION: 1. Significant right-sided pulmonary emboli involving both the right upper lobe and right lower lobe pulmonary arteries and their branches. No definite left-sided pulmonary emboli are identified. 2. Patchy peripheral airspace opacity in the right lung probably related to the patient's pulmonary emboli with small peripheral pulmonary infarcts. 3. Extensive airspace opacification in the left lung and loss of volume possibly related to extensive radiation pneumonitis/fibrosis. No discrete mass is identified. 4. Interval enlargement of mediastinal and hilar lymph nodes. 5. Indeterminate 4 mm pulmonary nodule in the right lower lobe. Attention on future imaging studies is suggested. 6. Fatty liver. 7. Aortic atherosclerosis. Aortic Atherosclerosis (ICD10-I70.0). These results will be called to the ordering clinician or representative by the Radiologist Assistant, and communication documented in the PACS or [employer]. Electronically Signed   By: MYRTIS Stammer M.D.   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  PLAN:    Iron  deficiency anemia: Secondary to diverticular bleed.  Resolved.  Patient's most recent colonoscopy and upper endoscopy was on October 05, 2023.  Her hemoglobin and iron  stores are now within normal limits.  She does not require additional IV iron  at this time.  Patient last received treatment on November 04, 2023.  Return to clinic in 3 months with repeat laboratory work, further evaluation, and can sideration of additional IV Venofer .   Diverticular bleed: Her most recent colonoscopy as above.  No intervention needed at this time.  Continue follow-up with GI as indicated.   Pulmonary embolism: CT scan  results from November 23, 2023 reviewed independently and reported as above with incidental right sided pulmonary embolism.  Patient is asymptomatic.  Hesitant to use anticoagulation given her recent significant GI bleed.  Have sent a referral to vascular surgery for consideration of IVC filter.   Patient report stage I colon cancer: Patient underwent surgical resection on June 21, 2023.  0/13 lymph nodes did not reveal evidence of malignancy.  Given the stage of disease, she did not require any further adjuvant treatment.  Her most recent colonoscopy as above showed no evidence of malignancy. Left lower lobe lung nodule: No biopsy was obtained,  but nodule was approximately 2.2 cm and significantly hypermetabolic on PET scan from Apr 01, 2023 therefore presumed malignant.  She underwent XRT completing in July 2024.  CT scan results as above with no obvious evidence of recurrence.  Will repeat CT scan in July 2025.     Patient expressed understanding and was in agreement with this plan. She also understands that She can call clinic at any time with any questions, concerns, or complaints.    Cancer Staging  Colon cancer [facility]) Staging form: Colon and Rectum, AJCC 8th Edition - Pathologic stage from 06/21/2023: Stage I (pT2, pN0, cM0) - Signed by Jacobo Evalene PARAS, MD on 10/19/2023 Stage prefix: Initial diagnosis Total positive nodes: 0   Evalene PARAS Jacobo, MD   11/26/2023 8:50 AM

## 2023-11-25 NOTE — Assessment & Plan Note (Signed)
 Chronic, stable - not on medications at this time Continue to hold Amlodipine and HCTZ At goal today, denies symptoms - Discussed stroke symptoms and cardiac - when to present to ED - monitor at home, Goal SBP<130; DBP<80

## 2023-11-25 NOTE — Assessment & Plan Note (Addendum)
 Chronic, managed, stable - last A1c = 7.1 - 10/13/23, goal <8 Continues on glipizide  5mg  daily with meals Continue to recommend balanced, lower carb meals. Smaller meal size, adding snacks. Choosing water  as drink of choice and increasing purposeful exercise. Due for optho exam - pt to schedule Discussed signs and symptoms of hypoglycemia.

## 2023-11-25 NOTE — Assessment & Plan Note (Signed)
 Chronic, variable Today expressed worsening Previously prescribed temazepam  nightly for insomnia needs Expressed sleep hygiene - limiting over hydrating before bed Set bed time schedule May try melatonin OTC If can't fall asleep get out of bed and go do activity Write down thoughts

## 2023-11-25 NOTE — Assessment & Plan Note (Signed)
 Chronic, Managed by hem/onc Receiving iron infusions - will hold on labs today given their current management - Denies any recent GI bleed or melena since start of transfusions.

## 2023-11-26 ENCOUNTER — Encounter: Payer: Self-pay | Admitting: Oncology

## 2023-11-29 ENCOUNTER — Encounter: Payer: Self-pay | Admitting: Oncology

## 2023-11-30 ENCOUNTER — Encounter: Payer: Self-pay | Admitting: Family Medicine

## 2023-11-30 ENCOUNTER — Encounter: Payer: Self-pay | Admitting: Oncology

## 2023-12-01 ENCOUNTER — Ambulatory Visit: Payer: Self-pay

## 2023-12-01 ENCOUNTER — Encounter: Payer: Self-pay | Admitting: Family Medicine

## 2023-12-01 ENCOUNTER — Other Ambulatory Visit: Payer: Self-pay | Admitting: Family Medicine

## 2023-12-01 DIAGNOSIS — B370 Candidal stomatitis: Secondary | ICD-10-CM

## 2023-12-01 MED ORDER — NYSTATIN 100000 UNIT/ML MT SUSP: 5.0000 mL | Freq: Four times a day (QID) | OROMUCOSAL | 0 refills | Status: DC

## 2023-12-01 MED ORDER — NYSTATIN 100000 UNIT/ML MT SUSP: 5.0000 mL | Freq: Four times a day (QID) | OROMUCOSAL | 0 refills | Status: AC

## 2023-12-01 NOTE — Telephone Encounter (Signed)
 Message from Cliff L sent at 12/01/2023  9:35 AM EST  Summary: Possible thrush   Pt's DIL states patient has possible thrush. DIL did send in a picture via mychart. First noticed it on Monday, and is requesting rx without patient having to come in.  Please call DIL to follow up, [phone]         Chief Complaint: oral thrush (see pictur in chart) Symptoms: also having SOB that daughter in law said was at her baseline (pt has PE- but per Dr. Layton Prima note "has no SOB" Frequency: Monday Pertinent Negatives: Patient denies pain Disposition: [] ED /[] Urgent Care (no appt availability in office) / [] Appointment(In office/virtual)/ []  [facility]/ [] Home Care/ [x] Refused Recommended Disposition /[] [facility]/ []  Follow-up with PCP Additional Notes: DIL Leary Provencal stated that she has known PE and planning to put in filter. Leary Provencal stated the SOB is not a new finding but was not written on Dr. Layton Prima note. Advised DIL to call Dr. Hilbert Loving off and to take her or call 911 if she worsens.  Leary Provencal stated there "is no way I can bring her in today"  Reason for Disposition  History of prior "blood clot" in leg or lungs (i.e., deep vein thrombosis, pulmonary embolism)    Pt has a PE  Answer Assessment - Initial Assessment Questions 1. SYMPTOM: "What's the main symptom you're concerned about?" (e.g., chapped lips, dry mouth, lump, sores)     White tongue 2. ONSET: "When did the  sx  start?"     Monday 3. PAIN: "Is there any pain?" If Yes, ask: "How bad is it?" (Scale: 1-10; mild, moderate, severe)   - MILD (1-3):  doesn't interfere with eating or normal activities   - MODERATE (4-7): interferes with eating some solids and normal activities   - SEVERE (8-10):  excruciating pain, interferes with most normal activities   - SEVERE DYSPHAGIA: can't swallow liquids, drooling     No pain  4. CAUSE: "What do you think is causing the symptoms?"     no 5. OTHER SYMPTOMS: "Do you have  any other symptoms?" (e.g., fever, sore throat, toothache, swelling)     no 6. PREGNANCY: "Is there any chance you are pregnant?" "When was your last menstrual period?"     N/a  Answer Assessment - Initial Assessment Questions 1. RESPIRATORY STATUS: "Describe your breathing?" (e.g., wheezing, shortness of breath, unable to speak, severe coughing)      SOB 2. ONSET: "When did this breathing problem begin?"      Ongoing per DIL Leary Provencal 3. PATTERN "Does the difficult breathing come and go, or has it been constant since it started?"      constant 4. SEVERITY: "How bad is your breathing?" (e.g., mild, moderate, severe)    - MILD: No SOB at rest, mild SOB with walking, speaks normally in sentences, can lie down, no retractions, pulse < 100.    - MODERATE: SOB at rest, SOB with minimal exertion and prefers to sit, cannot lie down flat, speaks in phrases, mild retractions, audible wheezing, pulse 100-120.    - SEVERE: Very SOB at rest, speaks in single words, struggling to breathe, sitting hunched forward, retractions, pulse > 120      Moderate: DIL said you can tell when she is talking   7. LUNG HISTORY: "Do you have any history of lung disease?"  (e.g., pulmonary embolus, asthma, emphysema)     Lung cancer 8. CAUSE: "What do you think is causing the  breathing problem?"      Pt has a PE  Protocols used: Mouth Symptoms-A-AH, Breathing Difficulty-A-AH

## 2023-12-02 ENCOUNTER — Encounter: Payer: Self-pay | Admitting: Oncology

## 2023-12-03 ENCOUNTER — Encounter: Payer: Self-pay | Admitting: Oncology

## 2023-12-03 ENCOUNTER — Inpatient Hospital Stay: Payer: Medicare Other | Admitting: Oncology

## 2023-12-03 VITALS — BP 110/67 | HR 115 | Temp 97.1°F | Resp 22 | Ht 62.0 in | Wt 115.4 lb

## 2023-12-03 DIAGNOSIS — K5791 Diverticulosis of intestine, part unspecified, without perforation or abscess with bleeding: Secondary | ICD-10-CM | POA: Diagnosis not present

## 2023-12-03 DIAGNOSIS — D509 Iron deficiency anemia, unspecified: Secondary | ICD-10-CM | POA: Diagnosis not present

## 2023-12-03 DIAGNOSIS — G4700 Insomnia, unspecified: Secondary | ICD-10-CM | POA: Diagnosis not present

## 2023-12-03 DIAGNOSIS — Z79899 Other long term (current) drug therapy: Secondary | ICD-10-CM | POA: Diagnosis not present

## 2023-12-03 DIAGNOSIS — D500 Iron deficiency anemia secondary to blood loss (chronic): Secondary | ICD-10-CM | POA: Diagnosis not present

## 2023-12-03 DIAGNOSIS — F32A Depression, unspecified: Secondary | ICD-10-CM

## 2023-12-03 DIAGNOSIS — R911 Solitary pulmonary nodule: Secondary | ICD-10-CM | POA: Diagnosis not present

## 2023-12-03 DIAGNOSIS — Z923 Personal history of irradiation: Secondary | ICD-10-CM | POA: Diagnosis not present

## 2023-12-03 DIAGNOSIS — F419 Anxiety disorder, unspecified: Secondary | ICD-10-CM | POA: Diagnosis not present

## 2023-12-03 DIAGNOSIS — Z85038 Personal history of other malignant neoplasm of large intestine: Secondary | ICD-10-CM | POA: Diagnosis not present

## 2023-12-03 DIAGNOSIS — I2699 Other pulmonary embolism without acute cor pulmonale: Secondary | ICD-10-CM | POA: Diagnosis not present

## 2023-12-03 DIAGNOSIS — Z634 Disappearance and death of family member: Secondary | ICD-10-CM | POA: Diagnosis not present

## 2023-12-03 DIAGNOSIS — Z87891 Personal history of nicotine dependence: Secondary | ICD-10-CM | POA: Diagnosis not present

## 2023-12-03 NOTE — Progress Notes (Signed)
Patient complains of SOB that has gotten worse. Son just passed away yesterday.

## 2023-12-03 NOTE — Progress Notes (Signed)
[facility]  [phone]) [phone] [phone]  ID: [name] OB: 12-Mar-1929  MR#: [id number]  AOZ#:[id number]  Patient Care Team: Sallee Provencal, FNP as PCP - General (Family Medicine) Nevada Crane, MD as Consulting Physician (Ophthalmology) Dasher, Cliffton Asters, MD (Dermatology) Jeralyn Ruths, MD as Consulting Physician (Oncology)  CHIEF COMPLAINT: History of stage I colon cancer, history left lower lobe lung nodule presumed malignant, iron deficiency anemia secondary to diverticular bleed.  INTERVAL HISTORY: Patient returns to clinic today as an add-on for further evaluation and further discussion of diagnostic/treatment options.  She is having increased anxiety, insomnia, and grief secondary to the death of his son this past week.  Her daughter also feels she is somewhat depressed.  She also has increased shortness of breath.  She has no neurologic complaints.  She denies any recent fevers.  She has good appetite and denies weight loss.  She has no chest pain, cough, or hemoptysis.  She denies any nausea, vomiting, constipation, or diarrhea.  She has no further melena or hematochezia.  She has no urinary complaints.  Patient offers no further specific complaints today.  REVIEW OF SYSTEMS:   Review of Systems  Constitutional:  Positive for malaise/fatigue. Negative for fever and weight loss.  Respiratory:  Positive for shortness of breath. Negative for cough and hemoptysis.   Cardiovascular: Negative.  Negative for chest pain and leg swelling.  Gastrointestinal: Negative.  Negative for abdominal pain, blood in stool and melena.  Genitourinary: Negative.  Negative for hematuria.  Musculoskeletal: Negative.  Negative for back pain.  Skin: Negative.  Negative for rash.  Neurological: Negative.  Negative for dizziness, focal weakness, weakness and headaches.  Psychiatric/Behavioral:  Positive for depression. The patient is nervous/anxious and has  insomnia.     As per HPI. Otherwise, a complete review of systems is negative.  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   a.) CT chest 03/09/2023: fusiform dilatation measuring 4.3 cm   Basal cell carcinoma (BCC) of left medial cheek    a.) s/p Mohs surgery 07/13/2022 [facility]   Bilateral carotid artery disease [facility])    a.) doppler 04/03/2007: 50-75% RICA; b.) s/p LEFT CEA with CorMatrix patch angioplasty 01/18/2015; c.) doppler 11/02/2016: 40-59% RICA; d.) doppler 01/10/2018: 1-39% RICA   Diverticulosis 08/05/2015   Eczema of both upper extremities 06/10/2021   Heart failure with preserved left ventricular function (HFpEF) (HCC)    a.) TTE 05/17/2017: EF >55%, mild LVH, mild LAE, mild panval regurg; b.) TTE 06/27/2018 : EF >55%, mod LVH, mild LAE, mild panval regurg'; G1dd; c.) TTE 12/07/2019: EF >55%, mild LVH, mild MAC, mild panval regurg; d.) TTE 01/27/2021: EF >55%, mod LVH, triv TR, mild AR/MR, G1DD   Heart murmur    History of right cataract extraction    Hyperlipidemia    Hypertension    Insomnia    a.) on BZO (temazepam) PRN   Irritable bowel syndrome with diarrhea 04/20/2017   LBP (  low back pain) 08/05/2015   Melanoma (HCC)    a.) s/p Mohs surgery 02/06/2020 - LEFT medial brow --> V to Y advancement flap   Melena    Non-small cell lung cancer (NSCLC) (HCC) 03/09/2023   a.) CT chest 03/09/2023 --> superior segment LLL --> 2.1 x 1.7 x 2.1 cm mixed ground-glass and subpleural nodule extending to  pleura with irreg margins --> concerning for primary bronchogenic carcinoma rather than metastatic disease; b.) 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 (60 Gy over 6 fractions)   OAB (overactive bladder)    PONV (postoperative nausea and vomiting)    a.) single episode following cataract surgery   PSVT (paroxysmal supraventricular tachycardia) (HCC) 02/22/2015   a.) holter 02/22/2015: 6 runs with longest lasting 10 beats   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   a.) MRI brain 04/02/2007 - subacute RIGHT pontine CVA   Stroke (HCC) 12/16/2014   a.) MRI brain 12/16/2014: punctate foci of restricted diffusion x 5 consistent with microembolic infarctions affecting the RIGHT lateral medulla, LEFT temporal lobe, 2 foci in the RIGHT thalamus, and LEFT inferior  frontal lobe/basal ganglia --> distribution would suggest cardiac or ascending aortic source   T2DM (type 2 diabetes mellitus) (HCC)    Tubular adenoma of colon     PAST SURGICAL HISTORY: Past Surgical History:  Procedure Laterality Date   APPENDECTOMY     BREAST BIOPSY Right    neg   BREAST SURGERY     1980's   CAROTID ENDARTERECTOMY Left 01/18/2015   Procedure: CAROTID ENDARTERECTOMY; Location: [facility]; Surgeon: [name], MD   CATARACT EXTRACTION Right 2012   CHOLECYSTECTOMY  06/27/2012   COLONOSCOPY N/A 10/05/2023   Procedure: COLONOSCOPY;  Surgeon: Toledo, Boykin Nearing, MD;  Location: [facility] ENDOSCOPY;  Service: Gastroenterology;  Laterality: N/A;   COLONOSCOPY WITH PROPOFOL N/A 02/17/2023   Procedure: COLONOSCOPY WITH PROPOFOL;  Surgeon: Regis Bill, MD;  Location: [facility] ENDOSCOPY;  Service: Endoscopy;  Laterality: N/A;   DECOMPRESSIVE LUMBAR LAMINECTOMY LEVEL 1  1950   ESOPHAGEAL BRUSHING  10/03/2023   Procedure: ESOPHAGEAL BRUSHING;  Surgeon: Norma Fredrickson, Boykin Nearing, MD;  Location: [facility] ENDOSCOPY;  Service: Gastroenterology;;   ESOPHAGOGASTRODUODENOSCOPY N/A 10/03/2023   Procedure: ESOPHAGOGASTRODUODENOSCOPY (EGD);   Surgeon: Toledo, Boykin Nearing, MD;  Location: [facility] ENDOSCOPY;  Service: Gastroenterology;  Laterality: N/A;   ESOPHAGOGASTRODUODENOSCOPY (EGD) WITH PROPOFOL N/A 02/16/2023   Procedure: ESOPHAGOGASTRODUODENOSCOPY (EGD) WITH PROPOFOL;  Surgeon: Regis Bill, MD;  Location: [facility] ENDOSCOPY;  Service: Endoscopy;  Laterality: N/A;   MELANOMA EXCISION Left 02/06/2020   Procedure: MOHS SURGERY WITH V TO Y ADVANCEMENT FLAP (LEFT MEDIAL EYEBROW); Location: [facility]   MOHS SURGERY Left 07/13/2022   Procedure: MOHS SURGERY (BCC LEFT MEDIAL CHEEK); Location: [facility]    FAMILY HISTORY: Family History  Problem Relation Age of Onset   Ovarian cancer Mother    Heart disease Brother    Heart attack Brother    Gout Brother    Alzheimer's disease Brother    Dementia Brother    Heart attack Brother    Diabetes Daughter    Melanoma Daughter    Stroke Son    Breast cancer Neg Hx     ADVANCED DIRECTIVES (Y/N):  N  HEALTH MAINTENANCE: Social History   Tobacco Use   Smoking status: Former    Current packs/day: 0.00    Types: Cigarettes    Quit date: 11/15/1976    Years since quitting: 47.0   Smokeless tobacco: Never  Vaping Use   Vaping  status: Never Used  Substance Use Topics   Alcohol use: No   Drug use: No     Colonoscopy:  PAP:  Bone density:  Lipid panel:  Allergies  Allergen Reactions   Cephalosporins     "upset stomach"   Codeine    Macrolides And Ketolides     "upset stomach" Other reaction(s): Unknown "upset stomach"   Nitrofurantoin     Other reaction(s): Other (See Comments)   Tramadol Nausea Only   Doxycycline Rash   Penicillins Other (See Comments) and Rash   Sulfa Antibiotics Rash    Current Outpatient Medications  Medication Sig Dispense Refill   atorvastatin (LIPITOR) 40 MG tablet Take 1 tablet (40 mg total) by mouth daily. 90 tablet 3   cholecalciferol (VITAMIN D) 1000 UNITS tablet Take 1,000 Units by mouth daily.      glipiZIDE (GLUCOTROL XL) 5 MG 24 hr tablet  Take 1 tablet (5 mg total) by mouth daily with breakfast. 90 tablet 3   nystatin (MYCOSTATIN) 100000 UNIT/ML suspension Take 5 mLs (500,000 Units total) by mouth 4 (four) times daily for 14 days. Shake well before use.  Measure liquid doses carefully to 5mL  Swish it in your mouth as long as you can before swallowing. 473 mL 0   Polyethylene Glycol 3350 (MIRALAX PO) Take by mouth.     simethicone (MYLICON) 125 MG chewable tablet Chew 1 tablet (125 mg total) by mouth every 6 (six) hours as needed for flatulence. 120 tablet 0   temazepam (RESTORIL) 15 MG capsule TAKE 1 CAPSULE BY MOUTH AT BEDTIME AS NEEDED FOR SLEEP 90 capsule 3   PARoxetine (PAXIL) 10 MG tablet Take 1 tablet (10 mg total) by mouth daily. (Patient not taking: Reported on 12/03/2023) 90 tablet 0   No current facility-administered medications for this visit.    OBJECTIVE: Vitals:   12/03/23 0932  BP: 110/67  Pulse: (!) 115  Resp: (!) 22  Temp: (!) 97.1 F (36.2 C)  SpO2: 94%     Body mass index is 21.11 kg/m.    ECOG FS:1 - Symptomatic but completely ambulatory  General: Well-developed, well-nourished, no acute distress. Eyes: Pink conjunctiva, anicteric sclera. HEENT: Normocephalic, moist mucous membranes. Lungs: No audible wheezing or coughing. Heart: Regular rate and rhythm. Abdomen: Soft, nontender, no obvious distention. Musculoskeletal: No edema, cyanosis, or clubbing. Neuro: Alert, answering all questions appropriately. Cranial nerves grossly intact. Skin: No rashes or petechiae noted. Psych: Normal affect.  LAB RESULTS:  Lab Results  Component Value Date   NA 137 10/13/2023   K 5.1 10/13/2023   CL 101 10/13/2023   CO2 21 10/13/2023   GLUCOSE 212 (H) 10/13/2023   BUN 12 10/13/2023   CREATININE 0.77 10/13/2023   CALCIUM 9.0 10/13/2023   PROT 6.4 10/13/2023   ALBUMIN 3.4 (L) 10/13/2023   AST 19 10/13/2023   ALT 14 10/13/2023   ALKPHOS 149 (H) 10/13/2023   BILITOT 0.4 10/13/2023   GFRNONAA >60  10/06/2023   GFRAA 82 05/14/2020    Lab Results  Component Value Date   WBC 8.2 11/25/2023   NEUTROABS 6.1 11/25/2023   HGB 12.4 11/25/2023   HCT 40.6 11/25/2023   MCV 87.3 11/25/2023   PLT 315 11/25/2023   Lab Results  Component Value Date   IRON 27 (L) 11/25/2023   TIBC 255 11/25/2023   IRONPCTSAT 11 11/25/2023   Lab Results  Component Value Date   FERRITIN 559 (H) 11/25/2023     STUDIES: CT CHEST  W CONTRAST Result Date: 11/23/2023 CLINICAL DATA:  Follow-up lung cancer. Status post radiation therapy. History of colon cancer also. * Tracking Code: BO * EXAM: CT CHEST WITH CONTRAST TECHNIQUE: Multidetector CT imaging of the chest was performed during intravenous contrast administration. RADIATION DOSE REDUCTION: This exam was performed according to the departmental dose-optimization program which includes automated exposure control, adjustment of the mA and/or kV according to patient size and/or use of iterative reconstruction technique. CONTRAST:  OMNIPAQUE IOHEXOL 300 MG/ML  SOLN COMPARISON:  Chest CT 03/09/2023 and PET-CT 04/01/2023 FINDINGS: Cardiovascular: The heart is normal in size. No pericardial effusion. Stable tortuosity and ectasia of the thoracic aorta. Stable advanced and progressive atherosclerotic changes along the aortic arch. Stable three-vessel coronary artery calcifications and stable extensive calcifications around the aortic valve and mitral valve annulus. Significant left-sided pulmonary emboli are noted involving both the right upper lobe and right lower lobe pulmonary arteries and their branches. No definite left-sided pulmonary emboli are identified. Mediastinum/Nodes: Interval enlargement mediastinal hilar lymph nodes. 9 mm right hilar node on image number 66/2. 11 mm left hilar node on image number 49/2. 5 mm prevascular lymph nodes. Lungs/Pleura: Extensive airspace opacification in the left lung and loss of volume possibly related to extensive radiation  pneumonitis/fibrosis. No discrete mass is identified. Patchy peripheral airspace opacity in the right lung probably related to the patient's pulmonary emboli with small peripheral pulmonary infarcts. No pleural effusion. Indeterminate 4 mm pulmonary nodule in the right lower lobe on image number 70/4. Attention on future imaging studies is suggested. Upper Abdomen: No significant upper abdominal findings. Diffuse fatty infiltration of the liver is noted. Stable aortic calcifications. No adrenal gland lesions. Musculoskeletal: No significant bony findings. IMPRESSION: 1. Significant right-sided pulmonary emboli involving both the right upper lobe and right lower lobe pulmonary arteries and their branches. No definite left-sided pulmonary emboli are identified. 2. Patchy peripheral airspace opacity in the right lung probably related to the patient's pulmonary emboli with small peripheral pulmonary infarcts. 3. Extensive airspace opacification in the left lung and loss of volume possibly related to extensive radiation pneumonitis/fibrosis. No discrete mass is identified. 4. Interval enlargement of mediastinal and hilar lymph nodes. 5. Indeterminate 4 mm pulmonary nodule in the right lower lobe. Attention on future imaging studies is suggested. 6. Fatty liver. 7. Aortic atherosclerosis. Aortic Atherosclerosis (ICD10-I70.0). These results will be called to the ordering clinician or representative by the Radiologist Assistant, and communication documented in the PACS or [employer]. Electronically Signed   By: Rudie Meyer M.D.   On: 11/23/2023 14:21    ASSESSMENT: History of stage I colon cancer, history left lower lobe lung nodule presumed malignant, iron deficiency anemia secondary to diverticular bleed.  PLAN:    Iron deficiency anemia: Secondary to diverticular bleed.  Resolved.  Patient's most recent colonoscopy and upper endoscopy was on October 05, 2023.  Her hemoglobin and iron stores are now within  normal limits.  She does not require additional IV iron at this time.  Patient last received treatment on November 04, 2023.  Return to clinic in 3 months as previously scheduled with repeat laboratory work, further evaluation, and can sideration of additional IV Venofer.   Diverticular bleed: Her most recent colonoscopy as above.  No intervention needed at this time.  Continue follow-up with GI as indicated.   Pulmonary embolism: CT scan results from November 23, 2023 reviewed independently and reported as above with incidental right sided pulmonary embolism.  Unclear if patient's increased shortness of  breath is related to her known pulmonary embolism or possibly from her increase in anxiety.  Hesitant to use anticoagulation given her recent significant GI bleed.  Have sent a referral to vascular surgery for consideration of IVC filter.  Patient has an appointment next week to see them. Patient report stage I colon cancer: Patient underwent surgical resection on June 21, 2023.  0/13 lymph nodes did not reveal evidence of malignancy.  Given the stage of disease, she did not require any further adjuvant treatment.  Her most recent colonoscopy as above showed no evidence of malignancy. Left lower lobe lung nodule: No biopsy was obtained, but nodule was approximately 2.2 cm and significantly hypermetabolic on PET scan from Apr 01, 2023 therefore presumed malignant.  She underwent XRT completing in July 2024.  CT scan results as above with no obvious evidence of recurrence.  Consider repeat CT scan in July 2025.   Anxiety/coping/insomnia: Patient was given [facility].  I spent a total of 30 minutes reviewing chart data, face-to-face evaluation with the patient, counseling and coordination of care as detailed above.    Patient expressed understanding and was in agreement with this plan. She also understands that She can call clinic at any time with any questions, concerns, or complaints.     Cancer Staging  Colon cancer [facility]) Staging form: Colon and Rectum, AJCC 8th Edition - Pathologic stage from 06/21/2023: Stage I (pT2, pN0, cM0) - Signed by Jeralyn Ruths, MD on 10/19/2023 Stage prefix: Initial diagnosis Total positive nodes: 0   Jeralyn Ruths, MD   12/03/2023 12:19 PM

## 2023-12-04 DIAGNOSIS — I10 Essential (primary) hypertension: Secondary | ICD-10-CM | POA: Insufficient documentation

## 2023-12-04 DIAGNOSIS — I2699 Other pulmonary embolism without acute cor pulmonale: Secondary | ICD-10-CM | POA: Insufficient documentation

## 2023-12-04 NOTE — Progress Notes (Unsigned)
MRN : [id number]  [name] is a 89 y.o. (18-Apr-1929) female who presents with chief complaint of legs hurt and swell.  History of Present Illness:  I am asked to see the patient by Dr. Orlie Dakin.  The patient presents to the office for evaluation of DVT associated with PE.  PE was identified at [facility] by CT angio dated November 23, 2023 which demonstrated right-sided pulmonary emboli and involving both the right upper and lower lobe arteries.  Shortness of breath.  The patient has had an extensive history of GI bleed and has been intolerant of anticoagulation in the past..    The patient has not been using compression therapy at this point.  No SOB or pleuritic chest pains.  No cough or hemoptysis.  No blood per rectum or blood in any sputum.  No excessive bruising per the patient.   No recent shortening of the patient's walking distance or new symptoms consistent with claudication.  No history of rest pain symptoms. No new ulcers or wounds of the lower extremities have occurred.  The patient denies amaurosis fugax or recent TIA symptoms. There are no recent neurological changes noted. No recent episodes of angina or shortness of breath documented.   No outpatient medications have been marked as taking for the 12/06/23 encounter (Appointment) with Gilda Crease, Latina Craver, MD.    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   a.) CT chest 03/09/2023: fusiform dilatation measuring 4.3 cm   Basal cell carcinoma (BCC) of left  medial cheek    a.) s/p Mohs surgery 07/13/2022 at [facility]   Bilateral carotid artery disease [facility])    a.) doppler 04/03/2007: 50-75% RICA; b.) s/p LEFT CEA with CorMatrix patch angioplasty 01/18/2015; c.) doppler 11/02/2016: 40-59% RICA; d.) doppler 01/10/2018: 1-39% RICA   Diverticulosis 08/05/2015   Eczema of both upper extremities 06/10/2021   Heart failure with preserved left ventricular function (HFpEF) (HCC)    a.) TTE 05/17/2017: EF >55%, mild LVH, mild LAE, mild panval regurg; b.) TTE 06/27/2018 : EF >55%, mod LVH, mild LAE, mild panval regurg'; G1dd; c.) TTE 12/07/2019: EF >55%, mild LVH, mild MAC, mild panval regurg; d.) TTE 01/27/2021: EF >55%, mod LVH, triv TR, mild AR/MR, G1DD   Heart murmur    History of right cataract extraction    Hyperlipidemia    Hypertension    Insomnia    a.) on BZO (temazepam) PRN   Irritable bowel syndrome with diarrhea 04/20/2017   LBP (low back pain) 08/05/2015   Melanoma (HCC)    a.) s/p Mohs surgery 02/06/2020 - LEFT medial brow --> V to Y advancement flap   Melena    Non-small cell lung cancer (NSCLC) (HCC) 03/09/2023   a.) CT chest 03/09/2023 --> superior segment LLL --> 2.1 x 1.7 x 2.1 cm mixed ground-glass and subpleural nodule extending to pleura with irreg margins --> concerning for primary bronchogenic carcinoma rather than metastatic disease; b.) stage I adenocarcinoma of the  LLL s/p SBRT (60 Gy over 6 fractions)   OAB (overactive bladder)    PONV (postoperative nausea and vomiting)    a.) single episode following cataract surgery   PSVT (paroxysmal supraventricular tachycardia) (HCC) 02/22/2015   a.) holter 02/22/2015: 6 runs with longest lasting 10 beats   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   a.) MRI brain 04/02/2007 - subacute RIGHT pontine CVA   Stroke (HCC) 12/16/2014   a.) MRI brain 12/16/2014: punctate foci of restricted diffusion x 5 consistent with microembolic infarctions affecting the RIGHT lateral medulla, LEFT temporal lobe,  2 foci in the RIGHT thalamus, and LEFT inferior  frontal lobe/basal ganglia --> distribution would suggest cardiac or ascending aortic source   T2DM (type 2 diabetes mellitus) (HCC)    Tubular adenoma of colon     Past Surgical History:  Procedure Laterality Date   APPENDECTOMY     BREAST BIOPSY Right    neg   BREAST SURGERY     1980's   CAROTID ENDARTERECTOMY Left 01/18/2015   Procedure: CAROTID ENDARTERECTOMY; Location: [facility]; Surgeon: [name], MD   CATARACT EXTRACTION Right 2012   CHOLECYSTECTOMY  06/27/2012   COLONOSCOPY N/A 10/05/2023   Procedure: COLONOSCOPY;  Surgeon: Toledo, Boykin Nearing, MD;  Location: [facility] ENDOSCOPY;  Service: Gastroenterology;  Laterality: N/A;   COLONOSCOPY WITH PROPOFOL N/A 02/17/2023   Procedure: COLONOSCOPY WITH PROPOFOL;  Surgeon: Regis Bill, MD;  Location: [facility] ENDOSCOPY;  Service: Endoscopy;  Laterality: N/A;   DECOMPRESSIVE LUMBAR LAMINECTOMY LEVEL 1  1950   ESOPHAGEAL BRUSHING  10/03/2023   Procedure: ESOPHAGEAL BRUSHING;  Surgeon: Norma Fredrickson, Boykin Nearing, MD;  Location: [facility] ENDOSCOPY;  Service: Gastroenterology;;   ESOPHAGOGASTRODUODENOSCOPY N/A 10/03/2023   Procedure: ESOPHAGOGASTRODUODENOSCOPY (EGD);  Surgeon: Toledo, Boykin Nearing, MD;  Location: [facility] ENDOSCOPY;  Service: Gastroenterology;  Laterality: N/A;   ESOPHAGOGASTRODUODENOSCOPY (EGD) WITH PROPOFOL N/A 02/16/2023   Procedure: ESOPHAGOGASTRODUODENOSCOPY (EGD) WITH PROPOFOL;  Surgeon: Regis Bill, MD;  Location: [facility] ENDOSCOPY;  Service: Endoscopy;  Laterality: N/A;   MELANOMA EXCISION Left 02/06/2020   Procedure: MOHS SURGERY WITH V TO Y ADVANCEMENT FLAP (LEFT MEDIAL EYEBROW); Location: [facility]   MOHS SURGERY Left 07/13/2022   Procedure: MOHS SURGERY (BCC LEFT MEDIAL CHEEK); Location: [facility]    Social History Social History   Tobacco Use   Smoking status: Former    Current packs/day: 0.00    Types: Cigarettes    Quit date: 11/15/1976    Years since quitting: 47.0    Smokeless tobacco: Never  Vaping Use   Vaping status: Never Used  Substance Use Topics   Alcohol use: No   Drug use: No    Family History Family History  Problem Relation Age of Onset   Ovarian cancer Mother    Heart disease Brother    Heart attack Brother    Gout Brother    Alzheimer's disease Brother    Dementia Brother    Heart attack Brother    Diabetes Daughter    Melanoma Daughter    Stroke Son    Breast cancer Neg Hx     Allergies  Allergen Reactions   Cephalosporins     "upset stomach"   Codeine    Macrolides And Ketolides     "upset stomach" Other reaction(s): Unknown "upset stomach"   Nitrofurantoin     Other reaction(s): Other (See Comments)   Tramadol Nausea Only   Doxycycline Rash   Penicillins Other (See Comments) and Rash   Sulfa Antibiotics Rash     REVIEW  OF SYSTEMS (Negative unless checked)  Constitutional: [] Weight loss  [] Fever  [] Chills Cardiac: [] Chest pain   [] Chest pressure   [] Palpitations   [] Shortness of breath when laying flat   [] Shortness of breath with exertion. Vascular:  [] Pain in legs with walking   [x] Pain in legs at rest  [] History of DVT   [] Phlebitis   [x] Swelling in legs   [] Varicose veins   [] Non-healing ulcers Pulmonary:   [] Uses home oxygen   [] Productive cough   [] Hemoptysis   [] Wheeze  [] COPD   [] Asthma Neurologic:  [] Dizziness   [] Seizures   [] History of stroke   [] History of TIA  [] Aphasia   [] Vissual changes   [] Weakness or numbness in arm   [x] Weakness or numbness in leg Musculoskeletal:   [] Joint swelling   [x] Joint pain   [] Low back pain Hematologic:  [] Easy bruising  [] Easy bleeding   [] Hypercoagulable state   [] Anemic Gastrointestinal:  [] Diarrhea   [] Vomiting  [] Gastroesophageal reflux/heartburn   [] Difficulty swallowing. Genitourinary:  [] Chronic kidney disease   [] Difficult urination  [] Frequent urination   [] Blood in urine Skin:  [] Rashes   [] Ulcers  Psychological:  [] History of anxiety   [x]  History of  major depression.  Physical Examination  There were no vitals filed for this visit. There is no height or weight on file to calculate BMI. Gen: WD/WN, NAD Head: [place]/AT, No temporalis wasting.  Ear/Nose/Throat: Hearing grossly intact, nares w/o erythema or drainage, pinna without lesions Eyes: PER, EOMI, sclera nonicteric.  Neck: Supple, no gross masses.  No JVD.  Pulmonary:  Good air movement, no audible wheezing, no use of accessory muscles.  Cardiac: RRR, precordium not hyperdynamic. Vascular:  scattered varicosities present bilaterally.  Moderate venous stasis changes to the legs bilaterally.  2+ soft pitting edema. CEAP [id number]   Vessel Right Left  Radial Palpable Palpable  Gastrointestinal: soft, non-distended. No guarding/no peritoneal signs.  Musculoskeletal: M/S 5/5 throughout.  No deformity.  Neurologic: CN 2-12 intact. Pain and light touch intact in extremities.  Symmetrical.  Speech is fluent. Motor exam as listed above. Psychiatric: Judgment intact, Mood & affect appropriate for pt's clinical situation. Dermatologic: Venous rashes no ulcers noted.  No changes consistent with cellulitis. Lymph : No lichenification or skin changes of chronic lymphedema.  CBC Lab Results  Component Value Date   WBC 8.2 11/25/2023   HGB 12.4 11/25/2023   HCT 40.6 11/25/2023   MCV 87.3 11/25/2023   PLT 315 11/25/2023    BMET    Component Value Date/Time   NA 137 10/13/2023 1417   NA 142 12/16/2014 0315   K 5.1 10/13/2023 1417   K 3.3 (L) 12/17/2014 1700   CL 101 10/13/2023 1417   CL 107 12/16/2014 0315   CO2 21 10/13/2023 1417   CO2 28 12/16/2014 0315   GLUCOSE 212 (H) 10/13/2023 1417   GLUCOSE 171 (H) 10/06/2023 0447   GLUCOSE 121 (H) 12/16/2014 0315   BUN 12 10/13/2023 1417   BUN 15 12/16/2014 0315   CREATININE 0.77 10/13/2023 1417   CREATININE 0.94 12/16/2014 0315   CALCIUM 9.0 10/13/2023 1417   CALCIUM 9.1 12/16/2014 0315   GFRNONAA >60 10/06/2023 0447   GFRNONAA >60  12/16/2014 0315   GFRNONAA >60 09/16/2013 1902   GFRAA 82 05/14/2020 0931   GFRAA >60 12/16/2014 0315   GFRAA >60 09/16/2013 1902   CrCl cannot be calculated (Patient's most recent lab result is older than the maximum 21 days allowed.).  COAG Lab Results  Component Value  Date   INR 1.3 (H) 10/03/2023   INR 1.0 12/15/2014    Radiology CT CHEST W CONTRAST Result Date: 11/23/2023 CLINICAL DATA:  Follow-up lung cancer. Status post radiation therapy. History of colon cancer also. * Tracking Code: BO * EXAM: CT CHEST WITH CONTRAST TECHNIQUE: Multidetector CT imaging of the chest was performed during intravenous contrast administration. RADIATION DOSE REDUCTION: This exam was performed according to the departmental dose-optimization program which includes automated exposure control, adjustment of the mA and/or kV according to patient size and/or use of iterative reconstruction technique. CONTRAST:  OMNIPAQUE IOHEXOL 300 MG/ML  SOLN COMPARISON:  Chest CT 03/09/2023 and PET-CT 04/01/2023 FINDINGS: Cardiovascular: The heart is normal in size. No pericardial effusion. Stable tortuosity and ectasia of the thoracic aorta. Stable advanced and progressive atherosclerotic changes along the aortic arch. Stable three-vessel coronary artery calcifications and stable extensive calcifications around the aortic valve and mitral valve annulus. Significant left-sided pulmonary emboli are noted involving both the right upper lobe and right lower lobe pulmonary arteries and their branches. No definite left-sided pulmonary emboli are identified. Mediastinum/Nodes: Interval enlargement mediastinal hilar lymph nodes. 9 mm right hilar node on image number 66/2. 11 mm left hilar node on image number 49/2. 5 mm prevascular lymph nodes. Lungs/Pleura: Extensive airspace opacification in the left lung and loss of volume possibly related to extensive radiation pneumonitis/fibrosis. No discrete mass is identified. Patchy  peripheral airspace opacity in the right lung probably related to the patient's pulmonary emboli with small peripheral pulmonary infarcts. No pleural effusion. Indeterminate 4 mm pulmonary nodule in the right lower lobe on image number 70/4. Attention on future imaging studies is suggested. Upper Abdomen: No significant upper abdominal findings. Diffuse fatty infiltration of the liver is noted. Stable aortic calcifications. No adrenal gland lesions. Musculoskeletal: No significant bony findings. IMPRESSION: 1. Significant right-sided pulmonary emboli involving both the right upper lobe and right lower lobe pulmonary arteries and their branches. No definite left-sided pulmonary emboli are identified. 2. Patchy peripheral airspace opacity in the right lung probably related to the patient's pulmonary emboli with small peripheral pulmonary infarcts. 3. Extensive airspace opacification in the left lung and loss of volume possibly related to extensive radiation pneumonitis/fibrosis. No discrete mass is identified. 4. Interval enlargement of mediastinal and hilar lymph nodes. 5. Indeterminate 4 mm pulmonary nodule in the right lower lobe. Attention on future imaging studies is suggested. 6. Fatty liver. 7. Aortic atherosclerosis. Aortic Atherosclerosis (ICD10-I70.0). These results will be called to the ordering clinician or representative by the Radiologist Assistant, and communication documented in the PACS or [employer]. Electronically Signed   By: Rudie Meyer M.D.   On: 11/23/2023 14:21     Assessment/Plan There are no diagnoses linked to this encounter.   [name], MD  12/04/2023 3:46 PM

## 2023-12-06 ENCOUNTER — Encounter (INDEPENDENT_AMBULATORY_CARE_PROVIDER_SITE_OTHER): Payer: Self-pay | Admitting: Vascular Surgery

## 2023-12-06 ENCOUNTER — Ambulatory Visit (INDEPENDENT_AMBULATORY_CARE_PROVIDER_SITE_OTHER): Payer: Medicare Other | Admitting: Vascular Surgery

## 2023-12-06 VITALS — BP 126/71 | HR 79 | Resp 17 | Wt 116.0 lb

## 2023-12-06 DIAGNOSIS — K5791 Diverticulosis of intestine, part unspecified, without perforation or abscess with bleeding: Secondary | ICD-10-CM | POA: Diagnosis not present

## 2023-12-06 DIAGNOSIS — I2699 Other pulmonary embolism without acute cor pulmonale: Secondary | ICD-10-CM | POA: Diagnosis not present

## 2023-12-06 DIAGNOSIS — I10 Essential (primary) hypertension: Secondary | ICD-10-CM | POA: Diagnosis not present

## 2023-12-06 DIAGNOSIS — E119 Type 2 diabetes mellitus without complications: Secondary | ICD-10-CM | POA: Diagnosis not present

## 2023-12-07 ENCOUNTER — Emergency Department: Payer: Medicare Other

## 2023-12-07 ENCOUNTER — Other Ambulatory Visit: Payer: Self-pay

## 2023-12-07 ENCOUNTER — Encounter (INDEPENDENT_AMBULATORY_CARE_PROVIDER_SITE_OTHER): Payer: Self-pay | Admitting: Vascular Surgery

## 2023-12-07 ENCOUNTER — Inpatient Hospital Stay: Payer: Medicare Other

## 2023-12-07 ENCOUNTER — Telehealth (INDEPENDENT_AMBULATORY_CARE_PROVIDER_SITE_OTHER): Payer: Self-pay

## 2023-12-07 ENCOUNTER — Inpatient Hospital Stay
Admission: EM | Admit: 2023-12-07 | Discharge: 2023-12-12 | DRG: 163 | Disposition: A | Discharge status: Home Health | Payer: Medicare Other | Attending: Internal Medicine | Admitting: Internal Medicine

## 2023-12-07 DIAGNOSIS — R778 Other specified abnormalities of plasma proteins: Secondary | ICD-10-CM | POA: Diagnosis not present

## 2023-12-07 DIAGNOSIS — E785 Hyperlipidemia, unspecified: Secondary | ICD-10-CM | POA: Diagnosis not present

## 2023-12-07 DIAGNOSIS — I82451 Acute embolism and thrombosis of right peroneal vein: Secondary | ICD-10-CM | POA: Diagnosis present

## 2023-12-07 DIAGNOSIS — Z7901 Long term (current) use of anticoagulants: Secondary | ICD-10-CM | POA: Diagnosis not present

## 2023-12-07 DIAGNOSIS — Z85820 Personal history of malignant melanoma of skin: Secondary | ICD-10-CM

## 2023-12-07 DIAGNOSIS — C3490 Malignant neoplasm of unspecified part of unspecified bronchus or lung: Secondary | ICD-10-CM | POA: Diagnosis not present

## 2023-12-07 DIAGNOSIS — Z808 Family history of malignant neoplasm of other organs or systems: Secondary | ICD-10-CM

## 2023-12-07 DIAGNOSIS — R9431 Abnormal electrocardiogram [ECG] [EKG]: Secondary | ICD-10-CM | POA: Diagnosis not present

## 2023-12-07 DIAGNOSIS — R7989 Other specified abnormal findings of blood chemistry: Secondary | ICD-10-CM | POA: Diagnosis not present

## 2023-12-07 DIAGNOSIS — I110 Hypertensive heart disease with heart failure: Secondary | ICD-10-CM | POA: Diagnosis present

## 2023-12-07 DIAGNOSIS — Z515 Encounter for palliative care: Secondary | ICD-10-CM | POA: Diagnosis not present

## 2023-12-07 DIAGNOSIS — E1169 Type 2 diabetes mellitus with other specified complication: Secondary | ICD-10-CM | POA: Diagnosis present

## 2023-12-07 DIAGNOSIS — C189 Malignant neoplasm of colon, unspecified: Secondary | ICD-10-CM | POA: Diagnosis present

## 2023-12-07 DIAGNOSIS — Z85038 Personal history of other malignant neoplasm of large intestine: Secondary | ICD-10-CM

## 2023-12-07 DIAGNOSIS — J984 Other disorders of lung: Secondary | ICD-10-CM | POA: Diagnosis not present

## 2023-12-07 DIAGNOSIS — I10 Essential (primary) hypertension: Secondary | ICD-10-CM | POA: Diagnosis present

## 2023-12-07 DIAGNOSIS — I2699 Other pulmonary embolism without acute cor pulmonale: Principal | ICD-10-CM | POA: Diagnosis present

## 2023-12-07 DIAGNOSIS — Z79899 Other long term (current) drug therapy: Secondary | ICD-10-CM

## 2023-12-07 DIAGNOSIS — I2609 Other pulmonary embolism with acute cor pulmonale: Principal | ICD-10-CM | POA: Diagnosis present

## 2023-12-07 DIAGNOSIS — I82442 Acute embolism and thrombosis of left tibial vein: Secondary | ICD-10-CM | POA: Diagnosis not present

## 2023-12-07 DIAGNOSIS — E1159 Type 2 diabetes mellitus with other circulatory complications: Secondary | ICD-10-CM | POA: Diagnosis present

## 2023-12-07 DIAGNOSIS — Z87891 Personal history of nicotine dependence: Secondary | ICD-10-CM

## 2023-12-07 DIAGNOSIS — Z85828 Personal history of other malignant neoplasm of skin: Secondary | ICD-10-CM

## 2023-12-07 DIAGNOSIS — Z885 Allergy status to narcotic agent status: Secondary | ICD-10-CM

## 2023-12-07 DIAGNOSIS — K219 Gastro-esophageal reflux disease without esophagitis: Secondary | ICD-10-CM | POA: Diagnosis not present

## 2023-12-07 DIAGNOSIS — Z8673 Personal history of transient ischemic attack (TIA), and cerebral infarction without residual deficits: Secondary | ICD-10-CM | POA: Diagnosis not present

## 2023-12-07 DIAGNOSIS — Z86711 Personal history of pulmonary embolism: Secondary | ICD-10-CM

## 2023-12-07 DIAGNOSIS — I5032 Chronic diastolic (congestive) heart failure: Secondary | ICD-10-CM | POA: Diagnosis present

## 2023-12-07 DIAGNOSIS — R0689 Other abnormalities of breathing: Secondary | ICD-10-CM | POA: Diagnosis not present

## 2023-12-07 DIAGNOSIS — Z8249 Family history of ischemic heart disease and other diseases of the circulatory system: Secondary | ICD-10-CM | POA: Diagnosis not present

## 2023-12-07 DIAGNOSIS — J189 Pneumonia, unspecified organism: Secondary | ICD-10-CM | POA: Diagnosis not present

## 2023-12-07 DIAGNOSIS — I358 Other nonrheumatic aortic valve disorders: Secondary | ICD-10-CM | POA: Diagnosis present

## 2023-12-07 DIAGNOSIS — R000 Tachycardia, unspecified: Secondary | ICD-10-CM | POA: Diagnosis not present

## 2023-12-07 DIAGNOSIS — I152 Hypertension secondary to endocrine disorders: Secondary | ICD-10-CM | POA: Diagnosis not present

## 2023-12-07 DIAGNOSIS — Z882 Allergy status to sulfonamides status: Secondary | ICD-10-CM

## 2023-12-07 DIAGNOSIS — Z85118 Personal history of other malignant neoplasm of bronchus and lung: Secondary | ICD-10-CM

## 2023-12-07 DIAGNOSIS — J9601 Acute respiratory failure with hypoxia: Secondary | ICD-10-CM | POA: Diagnosis not present

## 2023-12-07 DIAGNOSIS — R918 Other nonspecific abnormal finding of lung field: Secondary | ICD-10-CM | POA: Diagnosis not present

## 2023-12-07 DIAGNOSIS — R627 Adult failure to thrive: Secondary | ICD-10-CM | POA: Diagnosis present

## 2023-12-07 DIAGNOSIS — R55 Syncope and collapse: Secondary | ICD-10-CM | POA: Diagnosis not present

## 2023-12-07 DIAGNOSIS — Z7984 Long term (current) use of oral hypoglycemic drugs: Secondary | ICD-10-CM

## 2023-12-07 DIAGNOSIS — Z881 Allergy status to other antibiotic agents status: Secondary | ICD-10-CM

## 2023-12-07 DIAGNOSIS — J90 Pleural effusion, not elsewhere classified: Secondary | ICD-10-CM | POA: Diagnosis not present

## 2023-12-07 DIAGNOSIS — Z833 Family history of diabetes mellitus: Secondary | ICD-10-CM | POA: Diagnosis not present

## 2023-12-07 DIAGNOSIS — Z888 Allergy status to other drugs, medicaments and biological substances status: Secondary | ICD-10-CM

## 2023-12-07 DIAGNOSIS — Z923 Personal history of irradiation: Secondary | ICD-10-CM

## 2023-12-07 DIAGNOSIS — I7121 Aneurysm of the ascending aorta, without rupture: Secondary | ICD-10-CM | POA: Diagnosis present

## 2023-12-07 DIAGNOSIS — F339 Major depressive disorder, recurrent, unspecified: Secondary | ICD-10-CM | POA: Diagnosis not present

## 2023-12-07 DIAGNOSIS — Z86718 Personal history of other venous thrombosis and embolism: Secondary | ICD-10-CM

## 2023-12-07 DIAGNOSIS — I82409 Acute embolism and thrombosis of unspecified deep veins of unspecified lower extremity: Secondary | ICD-10-CM | POA: Insufficient documentation

## 2023-12-07 DIAGNOSIS — Z9049 Acquired absence of other specified parts of digestive tract: Secondary | ICD-10-CM

## 2023-12-07 DIAGNOSIS — Z880 Allergy status to penicillin: Secondary | ICD-10-CM

## 2023-12-07 DIAGNOSIS — Z9221 Personal history of antineoplastic chemotherapy: Secondary | ICD-10-CM

## 2023-12-07 DIAGNOSIS — I700 Atherosclerosis of aorta: Secondary | ICD-10-CM | POA: Diagnosis present

## 2023-12-07 DIAGNOSIS — G4700 Insomnia, unspecified: Secondary | ICD-10-CM | POA: Diagnosis not present

## 2023-12-07 DIAGNOSIS — R0902 Hypoxemia: Secondary | ICD-10-CM | POA: Diagnosis not present

## 2023-12-07 DIAGNOSIS — E119 Type 2 diabetes mellitus without complications: Secondary | ICD-10-CM

## 2023-12-07 DIAGNOSIS — Z823 Family history of stroke: Secondary | ICD-10-CM

## 2023-12-07 DIAGNOSIS — I959 Hypotension, unspecified: Secondary | ICD-10-CM | POA: Diagnosis not present

## 2023-12-07 DIAGNOSIS — K922 Gastrointestinal hemorrhage, unspecified: Secondary | ICD-10-CM | POA: Diagnosis present

## 2023-12-07 DIAGNOSIS — I82431 Acute embolism and thrombosis of right popliteal vein: Secondary | ICD-10-CM | POA: Diagnosis not present

## 2023-12-07 LAB — CBC WITH DIFFERENTIAL/PLATELET
Abs Immature Granulocytes: 0.06 10*3/uL (ref 0.00–0.07)
Basophils Absolute: 0 10*3/uL (ref 0.0–0.1)
Basophils Relative: 0 %
Eosinophils Absolute: 0 10*3/uL (ref 0.0–0.5)
Eosinophils Relative: 0 %
HCT: 42.3 % (ref 36.0–46.0)
Hemoglobin: 13.2 g/dL (ref 12.0–15.0)
Immature Granulocytes: 1 %
Lymphocytes Relative: 8 %
Lymphs Abs: 0.8 10*3/uL (ref 0.7–4.0)
MCH: 26.4 pg (ref 26.0–34.0)
MCHC: 31.2 g/dL (ref 30.0–36.0)
MCV: 84.6 fL (ref 80.0–100.0)
Monocytes Absolute: 0.5 10*3/uL (ref 0.1–1.0)
Monocytes Relative: 5 %
Neutro Abs: 8.8 10*3/uL — ABNORMAL HIGH (ref 1.7–7.7)
Neutrophils Relative %: 86 %
Platelets: 222 10*3/uL (ref 150–400)
RBC: 5 MIL/uL (ref 3.87–5.11)
RDW: 17.6 % — ABNORMAL HIGH (ref 11.5–15.5)
WBC: 10.3 10*3/uL (ref 4.0–10.5)
nRBC: 0 % (ref 0.0–0.2)

## 2023-12-07 LAB — COMPREHENSIVE METABOLIC PANEL
ALT: 22 U/L (ref 0–44)
AST: 37 U/L (ref 15–41)
Albumin: 2.6 g/dL — ABNORMAL LOW (ref 3.5–5.0)
Alkaline Phosphatase: 121 U/L (ref 38–126)
Anion gap: 13 (ref 5–15)
BUN: 11 mg/dL (ref 8–23)
CO2: 23 mmol/L (ref 22–32)
Calcium: 8.8 mg/dL — ABNORMAL LOW (ref 8.9–10.3)
Chloride: 100 mmol/L (ref 98–111)
Creatinine, Ser: 0.81 mg/dL (ref 0.44–1.00)
GFR, Estimated: >60 mL/min (ref >60)
Glucose, Bld: 226 mg/dL — ABNORMAL HIGH (ref 70–99)
Potassium: 4.6 mmol/L (ref 3.5–5.1)
Sodium: 136 mmol/L (ref 135–145)
Total Bilirubin: 1.6 mg/dL — ABNORMAL HIGH (ref 0.0–1.2)
Total Protein: 6.5 g/dL (ref 6.5–8.1)

## 2023-12-07 LAB — LACTIC ACID, PLASMA
Lactic Acid, Venous: 2.9 mmol/L (ref 0.5–1.9)
Lactic Acid, Venous: 1.7 mmol/L (ref 0.5–1.9)

## 2023-12-07 LAB — TROPONIN I (HIGH SENSITIVITY)
Troponin I (High Sensitivity): 575 ng/L (ref <18)
Troponin I (High Sensitivity): 633 ng/L (ref <18)

## 2023-12-07 LAB — PROTIME-INR
INR: 1.3 — ABNORMAL HIGH (ref 0.8–1.2)
Prothrombin Time: 16.1 s — ABNORMAL HIGH (ref 11.4–15.2)

## 2023-12-07 LAB — MRSA NEXT GEN BY PCR, NASAL: MRSA by PCR Next Gen: NOT DETECTED

## 2023-12-07 LAB — HEPARIN LEVEL (UNFRACTIONATED): Heparin Unfractionated: 0.16 [IU]/mL — ABNORMAL LOW (ref 0.30–0.70)

## 2023-12-07 LAB — CBG MONITORING, ED
Glucose-Capillary: 195 mg/dL — ABNORMAL HIGH (ref 70–99)
Glucose-Capillary: 103 mg/dL — ABNORMAL HIGH (ref 70–99)

## 2023-12-07 MED ORDER — INSULIN ASPART 100 UNIT/ML IJ SOLN
0.0000 [IU] | Freq: Three times a day (TID) | INTRAMUSCULAR | Status: DC
  Administered 2023-12-07: 1 [IU] via SUBCUTANEOUS
  Administered 2023-12-08 (×2): 2 [IU] via SUBCUTANEOUS
  Administered 2023-12-09: 3 [IU] via SUBCUTANEOUS
  Administered 2023-12-09 – 2023-12-10 (×3): 2 [IU] via SUBCUTANEOUS
  Administered 2023-12-10: 8 [IU] via SUBCUTANEOUS
  Administered 2023-12-11: 2 [IU] via SUBCUTANEOUS
  Administered 2023-12-11: 8 [IU] via SUBCUTANEOUS
  Administered 2023-12-11: 3 [IU] via SUBCUTANEOUS
  Administered 2023-12-12: 2 [IU] via SUBCUTANEOUS
  Filled 2023-12-07 (×10): qty 1

## 2023-12-07 MED ORDER — SODIUM CHLORIDE 0.9 % IV SOLN
500.0000 mg | INTRAVENOUS | Status: DC
  Administered 2023-12-07 – 2023-12-09 (×3): 500 mg via INTRAVENOUS
  Filled 2023-12-07 (×5): qty 5

## 2023-12-07 MED ORDER — SODIUM CHLORIDE 0.9 % IV SOLN
INTRAVENOUS | Status: DC
Start: 2023-12-07 — End: 2023-12-08

## 2023-12-07 MED ORDER — SENNOSIDES-DOCUSATE SODIUM 8.6-50 MG PO TABS: 1.0000 | ORAL_TABLET | Freq: Every evening | ORAL | Status: DC | PRN

## 2023-12-07 MED ORDER — HEPARIN (PORCINE) 25000 UT/250ML-% IV SOLN
950.0000 [IU]/h | INTRAVENOUS | Status: DC
  Administered 2023-12-07: 800 [IU]/h via INTRAVENOUS
  Filled 2023-12-07: qty 250

## 2023-12-07 MED ORDER — IOHEXOL 350 MG/ML SOLN
75.0000 mL | Freq: Once | INTRAVENOUS | Status: AC | PRN
  Administered 2023-12-07: 75 mL via INTRAVENOUS

## 2023-12-07 MED ORDER — ACETAMINOPHEN 650 MG RE SUPP: 650.0000 mg | Freq: Four times a day (QID) | RECTAL | Status: DC | PRN

## 2023-12-07 MED ORDER — ONDANSETRON HCL 4 MG/2ML IJ SOLN
4.0000 mg | Freq: Four times a day (QID) | INTRAMUSCULAR | Status: DC | PRN
  Administered 2023-12-09: 4 mg via INTRAVENOUS
  Filled 2023-12-07: qty 2

## 2023-12-07 MED ORDER — ATORVASTATIN CALCIUM 20 MG PO TABS
40.0000 mg | ORAL_TABLET | Freq: Every day | ORAL | Status: DC
Start: 2023-12-08 — End: 2023-12-12
  Administered 2023-12-08 – 2023-12-11 (×4): 40 mg via ORAL
  Filled 2023-12-07 (×4): qty 2

## 2023-12-07 MED ORDER — TEMAZEPAM 15 MG PO CAPS
15.0000 mg | ORAL_CAPSULE | Freq: Every evening | ORAL | Status: DC | PRN
  Administered 2023-12-07: 15 mg via ORAL
  Filled 2023-12-07 (×2): qty 1

## 2023-12-07 MED ORDER — ONDANSETRON HCL 4 MG PO TABS: 4.0000 mg | ORAL_TABLET | Freq: Four times a day (QID) | ORAL | Status: DC | PRN

## 2023-12-07 MED ORDER — PAROXETINE HCL 10 MG PO TABS
10.0000 mg | ORAL_TABLET | Freq: Every day | ORAL | Status: DC
  Administered 2023-12-07 – 2023-12-12 (×6): 10 mg via ORAL
  Filled 2023-12-07 (×7): qty 1

## 2023-12-07 MED ORDER — SODIUM CHLORIDE 0.9 % IV SOLN
2.0000 g | Freq: Once | INTRAVENOUS | Status: AC
  Administered 2023-12-07: 2 g via INTRAVENOUS
  Filled 2023-12-07: qty 10

## 2023-12-07 MED ORDER — INSULIN ASPART 100 UNIT/ML IJ SOLN: 0.0000 [IU] | Freq: Every day | INTRAMUSCULAR | Status: DC

## 2023-12-07 MED ORDER — VITAMIN D 25 MCG (1000 UNIT) PO TABS
1000.0000 [IU] | ORAL_TABLET | Freq: Every day | ORAL | Status: DC
  Administered 2023-12-08 – 2023-12-12 (×5): 1000 [IU] via ORAL
  Filled 2023-12-07 (×5): qty 1

## 2023-12-07 MED ORDER — SODIUM CHLORIDE 0.9 % IV SOLN
2.0000 g | INTRAVENOUS | Status: AC
  Administered 2023-12-07 – 2023-12-11 (×5): 2 g via INTRAVENOUS
  Filled 2023-12-07 (×5): qty 20

## 2023-12-07 MED ORDER — ACETAMINOPHEN 325 MG PO TABS
650.0000 mg | ORAL_TABLET | Freq: Four times a day (QID) | ORAL | Status: DC | PRN
  Administered 2023-12-08: 650 mg via ORAL

## 2023-12-07 MED ORDER — HYDRALAZINE HCL 20 MG/ML IJ SOLN: 5.0000 mg | Freq: Four times a day (QID) | INTRAMUSCULAR | Status: DC | PRN

## 2023-12-07 MED ORDER — VANCOMYCIN HCL 1250 MG/250ML IV SOLN
1250.0000 mg | INTRAVENOUS | Status: DC
  Filled 2023-12-07: qty 250

## 2023-12-07 MED ORDER — DEXTROSE 50 % IV SOLN: 1.0000 | INTRAVENOUS | Status: AC | PRN

## 2023-12-07 MED ORDER — VANCOMYCIN HCL IN DEXTROSE 1-5 GM/200ML-% IV SOLN
1000.0000 mg | Freq: Once | INTRAVENOUS | Status: AC
Start: 2023-12-07 — End: 2023-12-07
  Administered 2023-12-07: 1000 mg via INTRAVENOUS
  Filled 2023-12-07: qty 200

## 2023-12-07 NOTE — ED Notes (Signed)
Family stating that hospital tissues are too rough and also that pt has not eaten or drank anything since yesterday. Was waiting on CT results.

## 2023-12-07 NOTE — Assessment & Plan Note (Signed)
Occlusive thrombus in the mid to distal right popliteal vein.  Thrombus in the right peroneal vein.  Thrombus in the left posterior tibial and peroneal veins. Continue heparin per pharmacy

## 2023-12-07 NOTE — Assessment & Plan Note (Signed)
Patient had A1c on 10/13/2023: 7.1% Insulin SSI with at bedtime coverage ordered Goal inpatient blood glucose levels 140-180

## 2023-12-07 NOTE — ED Notes (Signed)
EDP informed of troponin 575 called by lab.

## 2023-12-07 NOTE — ED Notes (Signed)
Pt to CT

## 2023-12-07 NOTE — ED Notes (Signed)
Per vascular NP, procedure (thrombectomy) is tomorrow morning, pt can eat and drink until midnight. Family made aware.

## 2023-12-07 NOTE — ED Notes (Signed)
Vascular PA at bedside. Pt NPO again for now. Ultrasound at bedside.

## 2023-12-07 NOTE — Progress Notes (Signed)
PHARMACY - ANTICOAGULATION CONSULT NOTE  Pharmacy Consult for heparin Indication: pulmonary embolus  Allergies  Allergen Reactions   Cephalosporins     "upset stomach"   Codeine    Macrolides And Ketolides     "upset stomach" Other reaction(s): Unknown "upset stomach"   Nitrofurantoin     Other reaction(s): Other (See Comments)   Tramadol Nausea Only   Doxycycline Rash   Penicillins Other (See Comments) and Rash   Sulfa Antibiotics Rash    Patient Measurements: Height: 5\' 2"  (157.5 cm) Weight: 53 kg (116 lb 13.5 oz) IBW/kg (Calculated) : 50.1 Heparin Dosing Weight: 53 kg  Vital Signs: Temp: 97.8 F (36.6 C) (01/21 1230) Temp Source: Axillary (01/21 1230) BP: 111/69 (01/21 1430) Pulse Rate: 107 (01/21 1430)  Labs: Recent Labs    12/07/23 1224 12/07/23 1225  HGB 13.2  --   HCT 42.3  --   PLT 222  --   LABPROT 16.1*  --   INR 1.3*  --   CREATININE  --  0.81  TROPONINIHS  --  575*    Estimated Creatinine Clearance: 35 mL/min (by C-G formula based on SCr of 0.81 mg/dL).   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   a.) CT chest 03/09/2023: fusiform dilatation measuring 4.3 cm   Basal cell carcinoma (BCC) of left medial cheek    a.) s/p Mohs surgery 07/13/2022 at [facility]   Bilateral carotid artery disease [facility])    a.) doppler 04/03/2007: 50-75% RICA; b.) s/p LEFT CEA with CorMatrix patch angioplasty 01/18/2015; c.) doppler 11/02/2016: 40-59% RICA; d.) doppler 01/10/2018: 1-39% RICA    Diverticulosis 08/05/2015   Eczema of both upper extremities 06/10/2021   Heart failure with preserved left ventricular function (HFpEF) (HCC)    a.) TTE 05/17/2017: EF >55%, mild LVH, mild LAE, mild panval regurg; b.) TTE 06/27/2018 : EF >55%, mod LVH, mild LAE, mild panval regurg'; G1dd; c.) TTE 12/07/2019: EF >55%, mild LVH, mild MAC, mild panval regurg; d.) TTE 01/27/2021: EF >55%, mod LVH, triv TR, mild AR/MR, G1DD   Heart murmur    History of right cataract extraction    Hyperlipidemia    Hypertension    Insomnia    a.) on BZO (temazepam) PRN   Irritable bowel syndrome with diarrhea 04/20/2017   LBP (low back pain) 08/05/2015   Melanoma (HCC)    a.) s/p Mohs surgery 02/06/2020 - LEFT medial brow --> V to Y advancement flap   Melena    Non-small cell lung cancer (NSCLC) (HCC) 03/09/2023   a.) CT chest 03/09/2023 --> superior segment LLL --> 2.1 x 1.7 x 2.1 cm mixed ground-glass and subpleural nodule extending to pleura with irreg margins --> concerning for primary bronchogenic carcinoma rather than metastatic disease; b.) 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 (60 Gy over 6 fractions)   OAB (overactive bladder)    PONV (postoperative nausea and vomiting)    a.) single episode following cataract surgery   PSVT (paroxysmal  supraventricular tachycardia) (HCC) 02/22/2015   a.) holter 02/22/2015: 6 runs with longest lasting 10 beats   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   a.) MRI brain 04/02/2007 - subacute RIGHT pontine CVA   Stroke (HCC) 12/16/2014   a.) MRI brain 12/16/2014: punctate foci of restricted diffusion x 5 consistent with microembolic infarctions affecting the RIGHT lateral medulla, LEFT temporal lobe, 2 foci in the RIGHT thalamus, and LEFT inferior  frontal lobe/basal ganglia --> distribution would suggest cardiac or ascending aortic source   T2DM (type 2 diabetes mellitus) (HCC)    Tubular adenoma of colon    Assessment: 89 y/o female presenting with syncope. PMH  significant for PE (diagnosed on 11/23/23) not on anticoagulation due to extensive history of GI bleeding, colon cancer (s/p of partial colectomy), hypertension, hyperlipidemia, DM, diastolic CHF, stroke, GERD. Pharmacy has been consulted to initiate heparin infusion. Per vascular, will initiate infusion without bolus given bleeding history.   Baseline labs: Hgb 13.2, plt 222, INR 1.3  Goal of Therapy:  Heparin level 0.3-0.7 units/ml Monitor platelets by anticoagulation protocol: Yes   Plan:  Per vascular, witll not give bolus due to extensive history of GI bleeding Start heparin infusion at 800 units/hr Check anti-Xa level in 8 hours and daily while on heparin Continue to monitor H&H and platelets  Thank you for involving pharmacy in this patient's care.   Rockwell Alexandria, PharmD Clinical Pharmacist 12/07/2023 2:52 PM

## 2023-12-07 NOTE — Hospital Course (Addendum)
Ms. Cherry Canlas is a 89 year old female with history of aortic atherosclerosis, non-insulin-dependent diabetes mellitus, hyperlipidemia, depression, recent PE, history of GI bleed, who presents emergency department for chief concerns of syncope and collapse.  Vitals in the ED showed temperature of 97.8, respiration 24, heart rate of 110, blood pressure 112/69, SpO2 93% on 4 L nasal cannula.  Serum sodium is 136, potassium 4.6, chloride 100, bicarb 23, BUN of 11, serum creatinine of 0.81, EGFR greater than 60, nonfasting blood glucose 226, WBC 10.3, hemoglobin 13.2, platelets of 222.  High since troponin is 575.  Blood cultures x 2 are in process  CTA PE: Was read as increased thrombus burden in the right pulmonary arteries since prior CT from 11/23/2023, likely representing interval increased thromboembolic burden. There is likely some degree of right heart strain with estimated RV/LV ratio roughly 1.45.  Stable consolidation opacity in the posterior left upper lobe, inferior lingula, posterior left lower lobe.  Some of these are again likely related to prior radiation therapy, some degree of pneumonia in the left lung cannot be excluded.  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  ED treatment: Vancomycin, aztreonam, heparin per pharmacy.

## 2023-12-07 NOTE — ED Notes (Signed)
Also per EDP, pt is still NPO.

## 2023-12-07 NOTE — Assessment & Plan Note (Signed)
Home atorvastatin 40 mg nightly resumed

## 2023-12-07 NOTE — ED Notes (Signed)
Pt IS NPO (again). Pt is disappointed. Explained importance.

## 2023-12-07 NOTE — ED Notes (Signed)
Per EDP, now ok to drink and also start the heparin infusion.

## 2023-12-07 NOTE — Assessment & Plan Note (Signed)
Hydralazine 5 mg every 6 hours as needed for SBP greater 165, 5 days ordered

## 2023-12-07 NOTE — H&P (Addendum)
History and Physical   CHRISTIAN HETZER NFA:[id number] DOB: [birth year] DOA: 12/07/2023  PCP: Sallee Provencal, FNP Outpatient Specialists: Dr. Orlie Dakin, medical oncology Dr. Gilda Crease, vascular surgery Patient coming from: Home via EMS  I have personally briefly reviewed patient's old medical records in [facility] EMR.  Chief Concern: Syncope, collapse  HPI: [name] is a 89 year old female with history of aortic atherosclerosis, non-insulin-dependent diabetes mellitus, hyperlipidemia, depression, recent PE, history of GI bleed, who presents emergency department for chief concerns of syncope and collapse.  Vitals in the ED showed temperature of 97.8, respiration 24, heart rate of 110, blood pressure 112/69, SpO2 93% on 4 L nasal cannula.  Serum sodium is 136, potassium 4.6, chloride 100, bicarb 23, BUN of 11, serum creatinine of 0.81, EGFR greater than 60, nonfasting blood glucose 226, WBC 10.3, hemoglobin 13.2, platelets of 222.  High since troponin is 575.  Blood cultures x 2 are in process  CTA PE: Was read as increased thrombus burden in the right pulmonary arteries since prior CT from 11/23/2023, likely representing interval increased thromboembolic burden. There is likely some degree of right heart strain with estimated RV/LV ratio roughly 1.45.  Stable consolidation opacity in the posterior left upper lobe, inferior lingula, posterior left lower lobe.  Some of these are again likely related to prior radiation therapy, some degree of pneumonia in the left lung cannot be excluded.  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  ED treatment: Vancomycin, aztreonam, heparin per pharmacy. ------------------------- At bedside, patient able to tell me her name, age, location, current calendar year.  She reports that she had a witnessed syncopal event today.  Patient currently denies chest pain, shortness of breath, dysuria, hematuria, diarrhea.  She  reports that she is hungry and also she would like to urinate.  Patient last meal was 6:30 PM on 12/06/2023.  Social history: Patient lives at home.  She denies tobacco, EtOH, recreational drug use.  ROS: Constitutional: no weight change, no fever ENT/Mouth: no sore throat, no rhinorrhea Eyes: no eye pain, no vision changes Cardiovascular: no chest pain, no dyspnea,  no edema, no palpitations Respiratory: no cough, no sputum, no wheezing Gastrointestinal: no nausea, no vomiting, no diarrhea, no constipation Genitourinary: no urinary incontinence, no dysuria, no hematuria Musculoskeletal: no arthralgias, no myalgias Skin: no skin lesions, no pruritus, Neuro: + weakness, no loss of consciousness, + syncope Psych: no anxiety, no depression, no decrease appetite Heme/Lymph: no bruising, no bleeding  ED Course: Discussed with EDP, patient requiring hospitalization for chief concerns of syncope/collapse secondary to worsening PE.  Assessment/Plan  Principal Problem:   Pulmonary embolism (HCC) Active Problems:   GI bleeding   Hypertension associated with diabetes (HCC)   History of CVA (cerebrovascular accident)   Hyperlipidemia   Colon cancer (HCC)   FTT (failure to thrive) in adult   Diabetes mellitus without complication (HCC)   Depression, recurrent (HCC)   Essential hypertension   DVT (deep venous thrombosis) (HCC)   Assessment and Plan:  * Pulmonary embolism (HCC) Increase thrombus burden in the right pulmonary artery compared to prior CT imaging on 11/23/2023, representing interval increased thromboembolic burden RV/LV ratio is roughly 1.45 Patient with history of GI bleed, heparin per pharmacy has been ordered without bolus Complete echo has been ordered on admission Ultrasound of the lower extremity to assess for lower extremity DVT for consideration of thrombectomy while inpatient Patient has outpatient planned procedure for IVC filter placement on 12/14/2023  Vascular  surgery has been consulted by ED for consideration of IVC placement while patient admitted for increased PE burden  GI bleeding Hemoglobin stable on admission at 13.2 Recheck CBC in the a.m.  Hypertension associated with diabetes (HCC) Insulin SSI with at bedtime coverage ordered Goal inpatient blood glucose levels 140-180  Hyperlipidemia Home atorvastatin 40 mg nightly resumed  Colon cancer (HCC) Continue outpatient follow-up with medical oncology  DVT (deep venous thrombosis) (HCC) Occlusive thrombus in the mid to distal right popliteal vein.  Thrombus in the right peroneal vein.  Thrombus in the left posterior tibial and peroneal veins. Continue heparin per pharmacy  Essential hypertension Hydralazine 5 mg every 6 hours as needed for SBP greater 165, 5 days ordered  Diabetes mellitus without complication (HCC) Patient had A1c on 10/13/2023: 7.1% Insulin SSI with at bedtime coverage ordered Goal inpatient blood glucose levels 140-180  Poor prognosis - Palliative care consulted  Chart reviewed.   DVT prophylaxis: Heparin per pharmacy Code Status: Full code Diet: N.p.o. in case patient needs emergent thrombectomy given current burden of the bilateral DVT and unstable nature of occlusive thrombus. 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   a.) Bx (+) for invasive moderately invasive adenocarcinoma   Anemia    Aortic atherosclerosis (HCC)    Aortic stenosis    a.) TTE 05/17/2017: mild-mod AS (MPG 21.3); b.) TTE 06/17/2018: mild-mod AS (MPG 25.6/ AVA = 1.3 cm^2); c.) TTE 12/11/2019: mod AS (MPG 19.3/ AVA = 0.90 cm^2); d.) TTE 01/27/2021: mod AS (MPG 21.9/ AVA = 0.99 cm^2)   Ascending aorta  dilatation (HCC) 03/09/2023   a.) CT chest 03/09/2023: fusiform dilatation measuring 4.3 cm   Basal cell carcinoma (BCC) of left medial cheek    a.) s/p Mohs surgery 07/13/2022 [facility]   Bilateral carotid artery disease [facility])    a.) doppler 04/03/2007: 50-75% RICA; b.) s/p LEFT CEA with CorMatrix patch angioplasty 01/18/2015; c.) doppler 11/02/2016: 40-59% RICA; d.) doppler 01/10/2018: 1-39% RICA   Diverticulosis 08/05/2015   Eczema of both upper extremities 06/10/2021   Heart failure with preserved left ventricular function (HFpEF) (HCC)    a.) TTE 05/17/2017: EF >55%, mild LVH, mild LAE, mild panval regurg; b.) TTE 06/27/2018 : EF >55%, mod LVH, mild LAE, mild panval regurg'; G1dd; c.) TTE 12/07/2019: EF >55%, mild LVH, mild MAC, mild panval regurg; d.) TTE 01/27/2021: EF >55%, mod LVH, triv TR, mild AR/MR, G1DD   Heart murmur    History of right cataract extraction    Hyperlipidemia    Hypertension    Insomnia    a.) on BZO (temazepam) PRN   Irritable bowel syndrome with diarrhea 04/20/2017   LBP (low back pain) 08/05/2015   Melanoma (HCC)    a.) s/p Mohs surgery 02/06/2020 - LEFT medial brow --> V to Y advancement flap   Melena    Non-small cell lung cancer (NSCLC) (HCC) 03/09/2023   a.) CT chest 03/09/2023 --> superior segment LLL --> 2.1 x 1.7 x 2.1 cm mixed ground-glass and subpleural nodule extending to pleura with irreg margins --> concerning for primary bronchogenic carcinoma rather than metastatic disease; b.) 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 (60 Gy over 6 fractions)   OAB (overactive bladder)    PONV (postoperative nausea and vomiting)  a.) single episode following cataract surgery   PSVT (paroxysmal supraventricular tachycardia) (HCC) 02/22/2015   a.) holter 02/22/2015: 6 runs with longest lasting 10 beats   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   a.) MRI brain 04/02/2007 - subacute RIGHT pontine CVA   Stroke (HCC) 12/16/2014   a.) MRI brain 12/16/2014: punctate  foci of restricted diffusion x 5 consistent with microembolic infarctions affecting the RIGHT lateral medulla, LEFT temporal lobe, 2 foci in the RIGHT thalamus, and LEFT inferior  frontal lobe/basal ganglia --> distribution would suggest cardiac or ascending aortic source   T2DM (type 2 diabetes mellitus) (HCC)    Tubular adenoma of colon    Past Surgical History:  Procedure Laterality Date   APPENDECTOMY     BREAST BIOPSY Right    neg   BREAST SURGERY     1980's   CAROTID ENDARTERECTOMY Left 01/18/2015   Procedure: CAROTID ENDARTERECTOMY; Location: [facility]; Surgeon: [name], MD   CATARACT EXTRACTION Right 2012   CHOLECYSTECTOMY  06/27/2012   COLONOSCOPY N/A 10/05/2023   Procedure: COLONOSCOPY;  Surgeon: Toledo, Boykin Nearing, MD;  Location: [facility] ENDOSCOPY;  Service: Gastroenterology;  Laterality: N/A;   COLONOSCOPY WITH PROPOFOL N/A 02/17/2023   Procedure: COLONOSCOPY WITH PROPOFOL;  Surgeon: Regis Bill, MD;  Location: [facility] ENDOSCOPY;  Service: Endoscopy;  Laterality: N/A;   DECOMPRESSIVE LUMBAR LAMINECTOMY LEVEL 1  1950   ESOPHAGEAL BRUSHING  10/03/2023   Procedure: ESOPHAGEAL BRUSHING;  Surgeon: Norma Fredrickson, Boykin Nearing, MD;  Location: [facility] ENDOSCOPY;  Service: Gastroenterology;;   ESOPHAGOGASTRODUODENOSCOPY N/A 10/03/2023   Procedure: ESOPHAGOGASTRODUODENOSCOPY (EGD);  Surgeon: Toledo, Boykin Nearing, MD;  Location: [facility] ENDOSCOPY;  Service: Gastroenterology;  Laterality: N/A;   ESOPHAGOGASTRODUODENOSCOPY (EGD) WITH PROPOFOL N/A 02/16/2023   Procedure: ESOPHAGOGASTRODUODENOSCOPY (EGD) WITH PROPOFOL;  Surgeon: Regis Bill, MD;  Location: [facility] ENDOSCOPY;  Service: Endoscopy;  Laterality: N/A;   MELANOMA EXCISION Left 02/06/2020   Procedure: MOHS SURGERY WITH V TO Y ADVANCEMENT FLAP (LEFT MEDIAL EYEBROW); Location: [facility]   MOHS SURGERY Left 07/13/2022   Procedure: MOHS SURGERY (BCC LEFT MEDIAL CHEEK); Location: [facility]   Social History:  reports that she quit smoking about 47 years  ago. Her smoking use included cigarettes. She has never used smokeless tobacco. She reports that she does not drink alcohol and does not use drugs.  Allergies  Allergen Reactions   Codeine    Nitrofurantoin     Other reaction(s): Other (See Comments)   Tramadol Nausea Only   Doxycycline Rash   Penicillins Other (See Comments) and Rash   Sulfa Antibiotics Rash   Family History  Problem Relation Age of Onset   Ovarian cancer Mother    Heart disease Brother    Heart attack Brother    Gout Brother    Alzheimer's disease Brother    Dementia Brother    Heart attack Brother    Diabetes Daughter    Melanoma Daughter    Stroke Son    Breast cancer Neg Hx    Family history: Family history reviewed and not pertinent.  Prior to Admission medications   Medication Sig Start Date End Date Taking? Authorizing Provider  atorvastatin (LIPITOR) 40 MG tablet Take 1 tablet (40 mg total) by mouth daily. 07/13/23  Yes Merita Norton T, FNP  cholecalciferol (VITAMIN D) 1000 UNITS tablet Take 1,000 Units by mouth daily.    Yes [provider]  glipiZIDE (GLUCOTROL XL) 5 MG 24 hr tablet Take 1 tablet (5 mg total) by mouth daily with breakfast. 08/26/23  Yes Jacky Kindle, FNP  nystatin (MYCOSTATIN) 100000 UNIT/ML suspension Take 5 mLs (500,000 Units total) by mouth 4 (four) times daily for 14 days. Shake well before use.  Measure liquid doses carefully to 5mL  Swish it in your mouth as long as you can before swallowing. 12/01/23 12/15/23 Yes Sallee Provencal, FNP  Polyethylene Glycol 3350 (MIRALAX PO) Take by mouth.   Yes [provider]  simethicone (MYLICON) 125 MG chewable tablet Chew 1 tablet (125 mg total) by mouth every 6 (six) hours as needed for flatulence. 11/02/23  Yes Jacky Kindle, FNP  temazepam (RESTORIL) 15 MG capsule TAKE 1 CAPSULE BY MOUTH AT BEDTIME AS NEEDED FOR SLEEP 07/13/23  Yes Merita Norton T, FNP  PARoxetine (PAXIL) 10 MG tablet Take 1 tablet (10 mg total) by mouth  daily. Patient not taking: Reported on 11/25/2023 10/13/23   Jacky Kindle, FNP   Physical Exam: Vitals:   12/07/23 1230 12/07/23 1300 12/07/23 1400 12/07/23 1430  BP:  112/69 108/70 111/69  Pulse:  (!) 110 (!) 106 (!) 107  Resp:  (!) 24 (!) 25 (!) 25  Temp: 97.8 F (36.6 C)     TempSrc: Axillary     SpO2:  93% 92% 94%  Weight:      Height:       Constitutional: appears frail Eyes: PERRL, lids and conjunctivae normal ENMT: Mucous membranes are moist. Posterior pharynx clear of any exudate or lesions. Age-appropriate dentition. Hearing appropriate Neck: normal, supple, no masses, no thyromegaly Respiratory: clear to auscultation bilaterally, no wheezing, no crackles. Normal respiratory effort. No accessory muscle use.  Cardiovascular: Tachycardia, with regular rhythm, no murmurs / rubs / gallops. No extremity edema. 2+ pedal pulses. No carotid bruits.  Abdomen: no tenderness, no masses palpated, no hepatosplenomegaly. Bowel sounds positive.  Musculoskeletal: no clubbing / cyanosis. No joint deformity upper and lower extremities. Good ROM, no contractures, no atrophy. Normal muscle tone.  Skin: no rashes, lesions, ulcers. No induration Neurologic: Sensation intact. Strength 5/5 in all 4.  Psychiatric: Normal judgment and insight. Alert and oriented x 3. Normal mood.   EKG: independently reviewed, showing sinus tachycardia with rate of 108, QTc 457  Chest x-ray on Admission: I personally reviewed and I agree with radiologist reading as below.  US Venous Img Lower Bilateral (DVT) Result Date: 12/07/2023 CLINICAL DATA:  Pulmonary embolism. EXAM: BILATERAL LOWER EXTREMITY VENOUS DOPPLER ULTRASOUND TECHNIQUE: Gray-scale sonography with graded compression, as well as color Doppler and duplex ultrasound were performed to evaluate the lower extremity deep venous systems from the level of the common femoral vein and including the common femoral, femoral, profunda femoral, popliteal and calf  veins including the posterior tibial, peroneal and gastrocnemius veins when visible. The superficial great saphenous vein was also interrogated. Spectral Doppler was utilized to evaluate flow at rest and with distal augmentation maneuvers in the common femoral, femoral and popliteal veins. COMPARISON:  None Available. FINDINGS: RIGHT LOWER EXTREMITY Common Femoral Vein: No evidence of thrombus. Normal compressibility, respiratory phasicity and response to augmentation. Saphenofemoral Junction: No evidence of thrombus. Normal compressibility and flow on color Doppler imaging. Profunda Femoral Vein: No evidence of thrombus. Normal compressibility and flow on color Doppler imaging. Femoral Vein: No evidence of thrombus. Normal compressibility, respiratory phasicity and response to augmentation. Popliteal Vein: Some flow is present in the proximal popliteal vein. There is occlusive thrombus in the mid to distal segment. Calf Veins: Thrombus in the right peroneal vein. The right posterior tibial vein is  patent. Superficial Great Saphenous Vein: No evidence of thrombus. Normal compressibility. Venous Reflux:  None. Other Findings: No evidence of superficial thrombophlebitis or abnormal fluid collection. LEFT LOWER EXTREMITY Common Femoral Vein: No evidence of thrombus. Normal compressibility, respiratory phasicity and response to augmentation. Saphenofemoral Junction: No evidence of thrombus. Normal compressibility and flow on color Doppler imaging. Profunda Femoral Vein: No evidence of thrombus. Normal compressibility and flow on color Doppler imaging. Femoral Vein: No evidence of thrombus. Normal compressibility, respiratory phasicity and response to augmentation. Popliteal Vein: No evidence of thrombus. Normal compressibility, respiratory phasicity and response to augmentation. Calf Veins: Thrombus in left posterior tibial and peroneal veins. Superficial Great Saphenous Vein: No evidence of thrombus. Normal  compressibility. Venous Reflux:  None. Other Findings: No evidence of superficial thrombophlebitis or abnormal fluid collection. IMPRESSION: 1. Occlusive thrombus in the mid to distal right popliteal vein. Thrombus in the right peroneal vein. 2. Thrombus in the left posterior tibial and peroneal veins. Electronically Signed   By: Irish Lack M.D.   On: 12/07/2023 17:08   CT Angio Chest PE W and/or Wo Contrast Result Date: 12/07/2023 CLINICAL DATA:  Syncopal episode, hypoxia and known recent right-sided pulmonary embolism by CT. History of lung carcinoma and colon carcinoma. Prior radiation therapy to the left lower lobe in July. EXAM: CT ANGIOGRAPHY CHEST WITH CONTRAST TECHNIQUE: Multidetector CT imaging of the chest was performed using the standard protocol during bolus administration of intravenous contrast. Multiplanar CT image reconstructions and MIPs were obtained to evaluate the vascular anatomy. RADIATION DOSE REDUCTION: This exam was performed according to the departmental dose-optimization program which includes automated exposure control, adjustment of the mA and/or kV according to patient size and/or use of iterative reconstruction technique. CONTRAST:  75mL OMNIPAQUE IOHEXOL 350 MG/ML SOLN COMPARISON:  CTA of the chest on 11/23/2023 FINDINGS: Cardiovascular: Pulmonary embolism again noted on the right. Thrombus burden is greater than on the prior standard CT likely representing some interval increased thromboembolism since the prior study with increased thrombus at the bifurcation of the right main pulmonary artery with nonocclusive thrombus extending into upper, middle lobe and lower lobe pulmonary arteries. Most significant thrombus extends into the lower lobe pulmonary artery. No thrombus is identified on the left. Dilated central pulmonary arteries again present with the main pulmonary artery measuring 3.2 cm. There is likely some degree of right heart strain with estimated RV/LV ratio of  roughly 1.45. Stable aneurysmal disease of the ascending thoracic aorta measuring up to approximately 4.1 cm with associated calcified aortic valve and atherosclerosis of the thoracic aorta. The heart size is normal. Heavily calcified mitral valve annulus again noted. No pericardial fluid. Calcified coronary artery plaque present. Mediastinum/Nodes: No enlarged mediastinal, hilar, or axillary lymph nodes. Thyroid gland, trachea, and esophagus demonstrate no significant findings. Lungs/Pleura: Stable consolidative opacities in the posterior left upper lobe, inferior lingula and posterior left lower lobe. Some of these are again likely related to prior radiation therapy. Some degree pneumonia in the left lung cannot be excluded. There is a is stable small amount of left pleural fluid. Small slightly more prominent peripheral opacities in the lateral left upper lobe and posterior left lower lobe may relate to focal pulmonary infarcts and/or atelectasis. No pneumothorax. Upper Abdomen: No acute abnormality. Musculoskeletal: No chest wall abnormality. No acute or significant osseous findings. Review of the MIP images confirms the above findings. IMPRESSION: 1. Increased thrombus burden in the right pulmonary arteries since the prior standard CT of 11/23/2023, likely representing interval increased thromboembolic burden.  There is likely some degree of right heart strain with estimated RV/LV ratio of roughly 1.45. Consider correlation with echocardiography. 2. Stable consolidative opacities in the posterior left upper lobe, inferior lingula and posterior left lower lobe. Some of these are again likely related to prior radiation therapy. Some degree pneumonia in the left lung cannot be excluded. 3. Stable small amount of left pleural fluid. 4. Small slightly more prominent peripheral opacities in the lateral left upper lobe and posterior left lower lobe may relate to focal pulmonary infarcts and/or atelectasis. 5. Stable  aneurysmal disease of the ascending thoracic aorta measuring up to 4.1 cm with associated calcified aortic valve and atherosclerosis of the thoracic aorta. 6. Aortic atherosclerosis. Aortic aneurysm NOS (ICD10-I71.9). Electronically Signed   By: Irish Lack M.D.   On: 12/07/2023 14:59   DG Chest 2 View Result Date: 12/07/2023 CLINICAL DATA:  Suspected sepsis. EXAM: CHEST - 2 VIEW COMPARISON:  CT chest dated November 23, 2023. FINDINGS: The mediastinum remains shifted to the left with similar volume loss and patchy airspace opacification throughout much of the left lung, presumably radiation related. There are a few irregular opacities in the central and peripheral right upper lobe. Unchanged small left pleural effusion. No pneumothorax. No acute osseous abnormality. IMPRESSION: 1. Irregular opacities in the central and peripheral right upper lobe, concerning for pneumonia. 2. Similar volume loss and post radiation change in the left lung. Electronically Signed   By: Obie Dredge M.D.   On: 12/07/2023 14:28   Labs on Admission: I have personally reviewed following labs  CBC: Recent Labs  Lab 12/07/23 1224  WBC 10.3  NEUTROABS 8.8*  HGB 13.2  HCT 42.3  MCV 84.6  PLT 222   Basic Metabolic Panel: Recent Labs  Lab 12/07/23 1225  NA 136  K 4.6  CL 100  CO2 23  GLUCOSE 226*  BUN 11  CREATININE 0.81  CALCIUM 8.8*   GFR: Estimated Creatinine Clearance: 35 mL/min (by C-G formula based on SCr of 0.81 mg/dL).  Liver Function Tests: Recent Labs  Lab 12/07/23 1225  AST 37  ALT 22  ALKPHOS 121  BILITOT 1.6*  PROT 6.5  ALBUMIN 2.6*   Coagulation Profile: Recent Labs  Lab 12/07/23 1224  INR 1.3*   Urine analysis:    Component Value Date/Time   COLORURINE YELLOW 07/20/2023 1212   APPEARANCEUR CLEAR 07/20/2023 1212   APPEARANCEUR Clear 06/13/2021 1019   LABSPEC 1.025 07/20/2023 1212   LABSPEC 1.010 12/15/2014 1926   PHURINE 6.0 07/20/2023 1212   GLUCOSEU 500 (A)  07/20/2023 1212   GLUCOSEU NEGATIVE 12/15/2014 1926   HGBUR NEGATIVE 07/20/2023 1212   BILIRUBINUR NEGATIVE 07/20/2023 1212   BILIRUBINUR Negative 06/13/2021 1019   BILIRUBINUR NEGATIVE 12/15/2014 1926   KETONESUR NEGATIVE 07/20/2023 1212   PROTEINUR NEGATIVE 07/20/2023 1212   NITRITE NEGATIVE 07/20/2023 1212   LEUKOCYTESUR NEGATIVE 07/20/2023 1212   LEUKOCYTESUR NEGATIVE 12/15/2014 1926   This document was prepared using Dragon Voice Recognition software and may include unintentional dictation errors.  Dr. Sedalia Muta [facility]  If 7PM-7AM, please contact overnight-coverage provider If 7AM-7PM, please contact day attending provider [email or web address]  12/07/2023, 5:31 PM

## 2023-12-07 NOTE — ED Triage Notes (Signed)
To ED from home via [facility], sepsis alert No injuries, did not hit head. Pt also was nauesous this AM was helped to bed, then had witnessed syncopal episode 75% on RA then 85% on 4L, now 93% on 10L NRB  Known PE, not on thinners because has diverticulitis. Supposed to have IVC filter placed next week.  12 lead on scene was called to EDP Derrill Kay  EMS VS: 96.7 axillary, HE 128, etco2 20, RR 30, 128/78  Lives alone. Alert, oriented.

## 2023-12-07 NOTE — Assessment & Plan Note (Signed)
Increase thrombus burden in the right pulmonary artery compared to prior CT imaging on 11/23/2023, representing interval increased thromboembolic burden RV/LV ratio is roughly 1.45 Patient with history of GI bleed, heparin per pharmacy has been ordered without bolus Complete echo has been ordered on admission Ultrasound of the lower extremity to assess for lower extremity DVT for consideration of thrombectomy while inpatient Patient has outpatient planned procedure for IVC filter placement on 12/14/2023 Vascular surgery has been consulted by ED for consideration of IVC placement while patient admitted for increased PE burden

## 2023-12-07 NOTE — Assessment & Plan Note (Addendum)
Hemoglobin stable on admission at 13.2 Recheck CBC in the a.m.

## 2023-12-07 NOTE — Consult Note (Signed)
Hospital Consult    Reason for Consult:  Syncopal episode with known prior PE.  Requesting Physician:  Dr. Londell Moh MD  MRN #:  [id number]  History of Present Illness: This is a 89 y.o. female who was seen by Dr. [name] MD on 12/06/2023 for possible IVC filter placement due to known pulmonary embolism and unable to anticoagulate due to GI diverticulitis.  Patient was set up for outpatient IVC filter placement on 12/14/2023 by vein and vascular.  However patient presents to [facility] emergency department via EMS today after she was nauseous [place] this a.m. helped back to bed then had a syncopal episode.  EMS reports on arrival she was 75% O2 sat on room air then 85% saturation on 4 L of oxygen and now 93% on 10 L nonrebreather mask.  On exam this afternoon the patient was resting comfortably in a stretcher in the emergency department.  Heparin infusion was started.  Patient was on 4 L nasal cannula oxygen saturating at 93%.  Patient was tachycardic with labored breathing.  Denied any chest pain but did endorse some shortness of breath.  Blood pressure was soft at 99/77.  Vascular surgery was consulted to evaluate.  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   a.) CT chest 03/09/2023: fusiform dilatation measuring 4.3 cm   Basal cell carcinoma (BCC) of left medial cheek    a.) s/p Mohs surgery 07/13/2022 at [facility]   Bilateral carotid artery disease [facility])    a.) doppler 04/03/2007: 50-75% RICA; b.) s/p LEFT CEA with CorMatrix patch  angioplasty 01/18/2015; c.) doppler 11/02/2016: 40-59% RICA; d.) doppler 01/10/2018: 1-39% RICA   Diverticulosis 08/05/2015   Eczema of both upper extremities 06/10/2021   Heart failure with preserved left ventricular function (HFpEF) (HCC)    a.) TTE 05/17/2017: EF >55%, mild LVH, mild LAE, mild panval regurg; b.) TTE 06/27/2018 : EF >55%, mod LVH, mild LAE, mild panval regurg'; G1dd; c.) TTE 12/07/2019: EF >55%, mild LVH, mild MAC, mild panval regurg; d.) TTE 01/27/2021: EF >55%, mod LVH, triv TR, mild AR/MR, G1DD   Heart murmur    History of right cataract extraction    Hyperlipidemia    Hypertension    Insomnia    a.) on BZO (temazepam) PRN   Irritable bowel syndrome with diarrhea 04/20/2017   LBP (low back pain) 08/05/2015   Melanoma (HCC)    a.) s/p Mohs surgery 02/06/2020 - LEFT medial brow --> V to Y advancement flap   Melena    Non-small cell lung cancer (NSCLC) (HCC) 03/09/2023   a.) CT chest 03/09/2023 --> superior segment LLL --> 2.1 x 1.7 x 2.1 cm mixed ground-glass and subpleural nodule extending to pleura with irreg margins --> concerning for primary bronchogenic carcinoma rather than metastatic disease; b.) 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 (60 Gy over 6 fractions)   OAB (overactive bladder)    PONV (postoperative nausea and vomiting)    a.) single episode following cataract surgery  PSVT (paroxysmal supraventricular tachycardia) (HCC) 02/22/2015   a.) holter 02/22/2015: 6 runs with longest lasting 10 beats   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   a.) MRI brain 04/02/2007 - subacute RIGHT pontine CVA   Stroke (HCC) 12/16/2014   a.) MRI brain 12/16/2014: punctate foci of restricted diffusion x 5 consistent with microembolic infarctions affecting the RIGHT lateral medulla, LEFT temporal lobe, 2 foci in the RIGHT thalamus, and LEFT inferior  frontal lobe/basal ganglia --> distribution would suggest cardiac or ascending aortic source   T2DM (type 2 diabetes mellitus)  (HCC)    Tubular adenoma of colon     Past Surgical History:  Procedure Laterality Date   APPENDECTOMY     BREAST BIOPSY Right    neg   BREAST SURGERY     1980's   CAROTID ENDARTERECTOMY Left 01/18/2015   Procedure: CAROTID ENDARTERECTOMY; Location: [facility]; Surgeon: [name], MD   CATARACT EXTRACTION Right 2012   CHOLECYSTECTOMY  06/27/2012   COLONOSCOPY N/A 10/05/2023   Procedure: COLONOSCOPY;  Surgeon: Toledo, Boykin Nearing, MD;  Location: [facility] ENDOSCOPY;  Service: Gastroenterology;  Laterality: N/A;   COLONOSCOPY WITH PROPOFOL N/A 02/17/2023   Procedure: COLONOSCOPY WITH PROPOFOL;  Surgeon: Regis Bill, MD;  Location: [facility] ENDOSCOPY;  Service: Endoscopy;  Laterality: N/A;   DECOMPRESSIVE LUMBAR LAMINECTOMY LEVEL 1  1950   ESOPHAGEAL BRUSHING  10/03/2023   Procedure: ESOPHAGEAL BRUSHING;  Surgeon: Norma Fredrickson, Boykin Nearing, MD;  Location: [facility] ENDOSCOPY;  Service: Gastroenterology;;   ESOPHAGOGASTRODUODENOSCOPY N/A 10/03/2023   Procedure: ESOPHAGOGASTRODUODENOSCOPY (EGD);  Surgeon: Toledo, Boykin Nearing, MD;  Location: [facility] ENDOSCOPY;  Service: Gastroenterology;  Laterality: N/A;   ESOPHAGOGASTRODUODENOSCOPY (EGD) WITH PROPOFOL N/A 02/16/2023   Procedure: ESOPHAGOGASTRODUODENOSCOPY (EGD) WITH PROPOFOL;  Surgeon: Regis Bill, MD;  Location: [facility] ENDOSCOPY;  Service: Endoscopy;  Laterality: N/A;   MELANOMA EXCISION Left 02/06/2020   Procedure: MOHS SURGERY WITH V TO Y ADVANCEMENT FLAP (LEFT MEDIAL EYEBROW); Location: [facility]   MOHS SURGERY Left 07/13/2022   Procedure: MOHS SURGERY (BCC LEFT MEDIAL CHEEK); Location: [facility]    Allergies  Allergen Reactions   Codeine    Nitrofurantoin     Other reaction(s): Other (See Comments)   Tramadol Nausea Only   Doxycycline Rash   Penicillins Other (See Comments) and Rash   Sulfa Antibiotics Rash    Prior to Admission medications   Medication Sig Start Date End Date Taking? Authorizing Provider  atorvastatin (LIPITOR) 40 MG tablet Take  1 tablet (40 mg total) by mouth daily. 07/13/23  Yes Merita Norton T, FNP  cholecalciferol (VITAMIN D) 1000 UNITS tablet Take 1,000 Units by mouth daily.    Yes [provider]  glipiZIDE (GLUCOTROL XL) 5 MG 24 hr tablet Take 1 tablet (5 mg total) by mouth daily with breakfast. 08/26/23  Yes Jacky Kindle, FNP  nystatin (MYCOSTATIN) 100000 UNIT/ML suspension Take 5 mLs (500,000 Units total) by mouth 4 (four) times daily for 14 days. Shake well before use.  Measure liquid doses carefully to 5mL  Swish it in your mouth as long as you can before swallowing. 12/01/23 12/15/23 Yes Sallee Provencal, FNP  Polyethylene Glycol 3350 (MIRALAX PO) Take by mouth.   Yes [provider]  simethicone (MYLICON) 125 MG chewable tablet Chew 1 tablet (125 mg total) by mouth every 6 (six) hours as needed for flatulence. 11/02/23  Yes Jacky Kindle, FNP  temazepam (RESTORIL) 15 MG capsule TAKE 1 CAPSULE BY MOUTH AT BEDTIME AS NEEDED FOR SLEEP 07/13/23  Yes Suzie Portela,  Daryl Eastern, FNP  PARoxetine (PAXIL) 10 MG tablet Take 1 tablet (10 mg total) by mouth daily. Patient not taking: Reported on 11/25/2023 10/13/23   Jacky Kindle, FNP    Social History   Socioeconomic History   Marital status: Widowed    Spouse name: Not on file   Number of children: 2   Years of education: Not on file   Highest education level: 12th grade  Occupational History   Occupation: retired  Tobacco Use   Smoking status: Former    Current packs/day: 0.00    Types: Cigarettes    Quit date: 11/15/1976    Years since quitting: 47.0   Smokeless tobacco: Never  Vaping Use   Vaping status: Never Used  Substance and Sexual Activity   Alcohol use: No   Drug use: No   Sexual activity: Not Currently    Birth control/protection: Post-menopausal  Other Topics Concern   Not on file  Social History Narrative   Not on file   Social Drivers of Health   Financial Resource Strain: Low Risk  (05/09/2020)   Overall Financial Resource  Strain (CARDIA)    Difficulty of Paying Living Expenses: Not hard at all  Food Insecurity: No Food Insecurity (10/08/2023)   Hunger Vital Sign    Worried About Running Out of Food in the Last Year: Never true    Ran Out of Food in the Last Year: Never true  Transportation Needs: No Transportation Needs (10/08/2023)   PRAPARE - Administrator, Civil Service (Medical): No    Lack of Transportation (Non-Medical): No  Physical Activity: Inactive (05/09/2020)   Exercise Vital Sign    Days of Exercise per Week: 0 days    Minutes of Exercise per Session: 0 min  Stress: No Stress Concern Present (05/09/2020)   [employer] of Occupational Health - Occupational Stress Questionnaire    Feeling of Stress : Not at all  Social Connections: Moderately Isolated (05/09/2020)   Social Connection and Isolation Panel [NHANES]    Frequency of Communication with Friends and Family: Twice a week    Frequency of Social Gatherings with Friends and Family: Twice a week    Attends Religious Services: More than 4 times per year    Active Member of [employer] or Organizations: No    Attends Banker Meetings: Never    Marital Status: Widowed  Intimate Partner Violence: Not At Risk (10/08/2023)   Humiliation, Afraid, Rape, and Kick questionnaire    Fear of Current or Ex-Partner: No    Emotionally Abused: No    Physically Abused: No    Sexually Abused: No     Family History  Problem Relation Age of Onset   Ovarian cancer Mother    Heart disease Brother    Heart attack Brother    Gout Brother    Alzheimer's disease Brother    Dementia Brother    Heart attack Brother    Diabetes Daughter    Melanoma Daughter    Stroke Son    Breast cancer Neg Hx     ROS: Otherwise negative unless mentioned in HPI  Physical Examination  Vitals:   12/07/23 1400 12/07/23 1430  BP: 108/70 111/69  Pulse: (!) 106 (!) 107  Resp: (!) 25 (!) 25  Temp:    SpO2: 92% 94%   Body mass index is  21.37 kg/m.  General:  WDWN in NAD Gait: Not observed HENT: WNL, normocephalic Pulmonary: normal non-labored breathing, without Rales,  rhonchi,  wheezing Cardiac: regular, tachycardia 110-120, without  Murmurs, rubs or gallops; without carotid bruits Abdomen: Positive bowel sounds throughout, soft, NT/ND, no masses Skin: without rashes Vascular Exam/Pulses: Palpable pulses throughout but weak.  No lower extremity edema noted. Extremities: without ischemic changes, without Gangrene , without cellulitis; without open wounds;  Musculoskeletal: no muscle wasting or atrophy  Neurologic: A&O X 3;  No focal weakness or paresthesias are detected; speech is fluent/normal Psychiatric:  The pt has Normal affect. Lymph:  Unremarkable  CBC    Component Value Date/Time   WBC 10.3 12/07/2023 1224   RBC 5.00 12/07/2023 1224   HGB 13.2 12/07/2023 1224   HGB 8.5 (L) 10/13/2023 1417   HCT 42.3 12/07/2023 1224   HCT 27.7 (L) 10/13/2023 1417   PLT 222 12/07/2023 1224   PLT 373 10/13/2023 1417   MCV 84.6 12/07/2023 1224   MCV 88 10/13/2023 1417   MCV 89 12/16/2014 0315   MCH 26.4 12/07/2023 1224   MCHC 31.2 12/07/2023 1224   RDW 17.6 (H) 12/07/2023 1224   RDW 13.3 10/13/2023 1417   RDW 13.0 12/16/2014 0315   LYMPHSABS 0.8 12/07/2023 1224   LYMPHSABS 1.7 10/13/2023 1417   LYMPHSABS 3.3 12/16/2014 0315   MONOABS 0.5 12/07/2023 1224   MONOABS 0.7 12/16/2014 0315   EOSABS 0.0 12/07/2023 1224   EOSABS 0.1 10/13/2023 1417   EOSABS 0.1 12/16/2014 0315   BASOSABS 0.0 12/07/2023 1224   BASOSABS 0.0 10/13/2023 1417   BASOSABS 0.0 12/16/2014 0315    BMET    Component Value Date/Time   NA 136 12/07/2023 1225   NA 137 10/13/2023 1417   NA 142 12/16/2014 0315   K 4.6 12/07/2023 1225   K 3.3 (L) 12/17/2014 1700   CL 100 12/07/2023 1225   CL 107 12/16/2014 0315   CO2 23 12/07/2023 1225   CO2 28 12/16/2014 0315   GLUCOSE 226 (H) 12/07/2023 1225   GLUCOSE 121 (H) 12/16/2014 0315   BUN 11  12/07/2023 1225   BUN 12 10/13/2023 1417   BUN 15 12/16/2014 0315   CREATININE 0.81 12/07/2023 1225   CREATININE 0.94 12/16/2014 0315   CALCIUM 8.8 (L) 12/07/2023 1225   CALCIUM 9.1 12/16/2014 0315   GFRNONAA >60 12/07/2023 1225   GFRNONAA >60 12/16/2014 0315   GFRNONAA >60 09/16/2013 1902   GFRAA 82 05/14/2020 0931   GFRAA >60 12/16/2014 0315   GFRAA >60 09/16/2013 1902    COAGS: Lab Results  Component Value Date   INR 1.3 (H) 12/07/2023   INR 1.3 (H) 10/03/2023   INR 1.0 12/15/2014     Non-Invasive Vascular Imaging:   EXAM:12/07/23 CT ANGIOGRAPHY CHEST WITH CONTRAST   TECHNIQUE: Multidetector CT imaging of the chest was performed using the standard protocol during bolus administration of intravenous contrast. Multiplanar CT image reconstructions and MIPs were obtained to evaluate the vascular anatomy.   RADIATION DOSE REDUCTION: This exam was performed according to the departmental dose-optimization program which includes automated exposure control, adjustment of the mA and/or kV according to patient size and/or use of iterative reconstruction technique.   CONTRAST:  75mL OMNIPAQUE IOHEXOL 350 MG/ML SOLN   COMPARISON:  CTA of the chest on 11/23/2023   FINDINGS: Cardiovascular: Pulmonary embolism again noted on the right. Thrombus burden is greater than on the prior standard CT likely representing some interval increased thromboembolism since the prior study with increased thrombus at the bifurcation of the right main pulmonary artery with nonocclusive thrombus extending into upper, middle  lobe and lower lobe pulmonary arteries. Most significant thrombus extends into the lower lobe pulmonary artery. No thrombus is identified on the left. Dilated central pulmonary arteries again present with the main pulmonary artery measuring 3.2 cm. There is likely some degree of right heart strain with estimated RV/LV ratio of roughly 1.45.   Stable aneurysmal disease of the  ascending thoracic aorta measuring up to approximately 4.1 cm with associated calcified aortic valve and atherosclerosis of the thoracic aorta. The heart size is normal. Heavily calcified mitral valve annulus again noted. No pericardial fluid. Calcified coronary artery plaque present.   Mediastinum/Nodes: No enlarged mediastinal, hilar, or axillary lymph nodes. Thyroid gland, trachea, and esophagus demonstrate no significant findings.   Lungs/Pleura: Stable consolidative opacities in the posterior left upper lobe, inferior lingula and posterior left lower lobe. Some of these are again likely related to prior radiation therapy. Some degree pneumonia in the left lung cannot be excluded. There is a is stable small amount of left pleural fluid. Small slightly more prominent peripheral opacities in the lateral left upper lobe and posterior left lower lobe may relate to focal pulmonary infarcts and/or atelectasis. No pneumothorax.   Upper Abdomen: No acute abnormality.   Musculoskeletal: No chest wall abnormality. No acute or significant osseous findings.   Review of the MIP images confirms the above findings.   IMPRESSION: 1. Increased thrombus burden in the right pulmonary arteries since the prior standard CT of 11/23/2023, likely representing interval increased thromboembolic burden. There is likely some degree of right heart strain with estimated RV/LV ratio of roughly 1.45. Consider correlation with echocardiography. 2. Stable consolidative opacities in the posterior left upper lobe, inferior lingula and posterior left lower lobe. Some of these are again likely related to prior radiation therapy. Some degree pneumonia in the left lung cannot be excluded. 3. Stable small amount of left pleural fluid. 4. Small slightly more prominent peripheral opacities in the lateral left upper lobe and posterior left lower lobe may relate to focal pulmonary infarcts and/or atelectasis. 5.  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 6. Aortic atherosclerosis.  Statin:  Yes.   Beta Blocker:  No. Aspirin:  No. ACEI:  No. ARB:  No. CCB use:  No Other antiplatelets/anticoagulants:  No.  Unable to tolerate due to GI bleeding.   ASSESSMENT/PLAN: This is a 89 y.o. female who presents to [facility] emergency department via EMS today after she was nauseated this morning and helped back to bed then having a syncopal episode sliding to the floor.  EMS reports the patient had an oxygen saturation of 75% on room air when they arrived.  Upon workup patient was noted to have a prior pulmonary embolism that has now extended through the right main pulmonary arteries.  She has an RV to LV ratio of 1.45 with heart strain.  She is also noted to have stable consolidative opacities in her left lung which can be pneumonia.  Vascular surgery plans on taking the patient to the vascular lab on 12/08/2023 for a pulmonary thrombectomy with IVC filter placement.  I discussed in detail with the patient and her daughter-in-law at the bedside this afternoon the procedure, benefits, risk, and complications.  They both wish to proceed as soon as possible.  They both verbalized her understanding.  I answered all of their questions this afternoon.  Patient will be made n.p.o. after midnight tonight for procedure tomorrow.   -  I personally reviewed the CT scans and discussed the plan in detail with Dr. [name] MD and he agrees with the plan   Marcie [facility] 12/07/2023 3:26 PM

## 2023-12-07 NOTE — ED Provider Notes (Signed)
[facility] Provider Note    Event Date/Time   First MD Initiated Contact with Patient 12/07/23 1222     (approximate)   History   Fall and sepsis alert   HPI  [name] is a 89 y.o. female who presents to the emergency department today via EMS from home because of concerns for weakness and shortness of breath.  Patient was diagnosed with pulmonary embolism a couple of weeks ago.  She however has not been placed on anticoagulation given previous history of significant GI bleed.  She is scheduled to get an IVC filter by vascular surgery.  Patient states today she felt especially weak.  She was walking and had to lower herself to the ground.  Again when her daughter was with her her wheeze got make and she went to the ground.  She did not hit her head and denies any pain from the incidents.     Physical Exam   Triage Vital Signs: ED Triage Vitals  Encounter Vitals Group     BP 12/07/23 1223 119/79     Systolic BP Percentile --      Diastolic BP Percentile --      Pulse Rate 12/07/23 1223 (!) 116     Resp 12/07/23 1223 (!) 30     Temp 12/07/23 1230 97.8 F (36.6 C)     Temp Source 12/07/23 1230 Axillary     SpO2 12/07/23 1223 100 %     Weight 12/07/23 1220 116 lb 13.5 oz (53 kg)     Height 12/07/23 1220 5\' 2"  (1.575 m)     Head Circumference --      Peak Flow --      Pain Score 12/07/23 1218 0     Pain Loc --      Pain Education --      Exclude from Growth Chart --     Most recent vital signs: Vitals:   12/07/23 1223 12/07/23 1230  BP: 119/79   Pulse: (!) 116   Resp: (!) 30   Temp:  97.8 F (36.6 C)  SpO2: 100%    General: Awake, alert, oriented. CV:  Good peripheral perfusion. Tachycardia. Resp:  Slightly increased work of breathing. Lungs clear. Abd:  No distention.    ED Results / Procedures / Treatments   Labs (all labs ordered are listed, but only abnormal results are displayed) Labs Reviewed  LACTIC ACID, PLASMA -  Abnormal; Notable for the following components:      Result Value   Lactic Acid, Venous 2.9 (*)    All other components within normal limits  CBC WITH DIFFERENTIAL/PLATELET - Abnormal; Notable for the following components:   RDW 17.6 (*)    Neutro Abs 8.8 (*)    All other components within normal limits  PROTIME-INR - Abnormal; Notable for the following components:   Prothrombin Time 16.1 (*)    INR 1.3 (*)    All other components within normal limits  COMPREHENSIVE METABOLIC PANEL - Abnormal; Notable for the following components:   Glucose, Bld 226 (*)    Calcium 8.8 (*)    Albumin 2.6 (*)    Total Bilirubin 1.6 (*)    All other components within normal limits  TROPONIN I (HIGH SENSITIVITY) - Abnormal; Notable for the following components:   Troponin I (High Sensitivity) 575 (*)    All other components within normal limits  CULTURE, BLOOD (ROUTINE X 2)  CULTURE, BLOOD (ROUTINE X 2)  MRSA NEXT GEN BY PCR, NASAL  LACTIC ACID, PLASMA  URINALYSIS, W/ REFLEX TO CULTURE (INFECTION SUSPECTED)  HEPARIN LEVEL (UNFRACTIONATED)  TROPONIN I (HIGH SENSITIVITY)     RADIOLOGY I independently interpreted and visualized the CT angio. My interpretation: large right sided PE Radiology interpretation:  IMPRESSION:  1. Increased thrombus burden in the right pulmonary arteries since  the prior standard CT of 11/23/2023, likely representing interval  increased thromboembolic burden. There is likely some degree of  right heart strain with estimated RV/LV ratio of roughly 1.45.  Consider correlation with echocardiography.  2. Stable consolidative opacities in the posterior left upper lobe,  inferior lingula and posterior left lower lobe. Some of these are  again likely related to prior radiation therapy. Some degree  pneumonia in the left lung cannot be excluded.  3. Stable small amount of left pleural fluid.  4. Small slightly more prominent peripheral opacities in the lateral  left upper lobe  and posterior left lower lobe may relate to focal  pulmonary infarcts and/or atelectasis.  5. Stable aneurysmal disease of the ascending thoracic aorta  measuring up to 4.1 cm with associated calcified aortic valve and  atherosclerosis of the thoracic aorta.  6. Aortic atherosclerosis.    Aortic aneurysm NOS (ICD10-I71.9).    I independently interpreted and visualized the CXR. My interpretation: left sided opacities Radiology interpretation:  IMPRESSION:  1. Irregular opacities in the central and peripheral right upper  lobe, concerning for pneumonia.  2. Similar volume loss and post radiation change in the left lung.     PROCEDURES:  Critical Care performed: Yes  CRITICAL CARE Performed by: Phineas Semen   Total critical care time: 35 minutes  Critical care time was exclusive of separately billable procedures and treating other patients.  Critical care was necessary to treat or prevent imminent or life-threatening deterioration.  Critical care was time spent personally by me on the following activities: development of treatment plan with patient and/or surrogate as well as nursing, discussions with consultants, evaluation of patient's response to treatment, examination of patient, obtaining history from patient or surrogate, ordering and performing treatments and interventions, ordering and review of laboratory studies, ordering and review of radiographic studies, pulse oximetry and re-evaluation of patient's condition.   Procedures    MEDICATIONS ORDERED IN ED: Medications - No data to display   IMPRESSION / MDM / ASSESSMENT AND PLAN / ED COURSE  I reviewed the triage vital signs and the nursing notes.                              Differential diagnosis includes, but is not limited to, anemia, electrolyte abnormality, infection, worsening clot burden  Patient's presentation is most consistent with acute presentation with potential threat to life or bodily  function.   The patient is on the cardiac monitor to evaluate for evidence of arrhythmia and/or significant heart rate changes.  Patient presents to the emergency department today because of concerns for increasing weakness and shortness of breath. Patient with recent diagnosis of PE, not anticoagulated. Would have concern for worsening clot burden, infection, anemia. Will check blood work and CT.   CT scan shows increased clot burden. Elevated troponin. Discussed with Dr. Gilda Crease with vascular surgery. Recommended heparin but no bolus at this time given patient's history of bleeding. Discussed findings and concern with patient and family. Also have concern for possible pneumonia so antibiotics were ordered. Discussed with Dr. Sedalia Muta with  the hospitalist service who will evaluate for admission.      FINAL CLINICAL IMPRESSION(S) / ED DIAGNOSES   Final diagnoses:  Acute pulmonary embolism, unspecified pulmonary embolism type, unspecified whether acute cor pulmonale present (HCC)  Elevated troponin    Note:  This document was prepared using Dragon voice recognition software and may include unintentional dictation errors.    Phineas Semen, MD 12/07/23 [phone]

## 2023-12-07 NOTE — H&P (View-Only) (Signed)
Hospital Consult    Reason for Consult:  Syncopal episode with known prior PE.  Requesting Physician:  Dr. Londell Moh MD  MRN #:  [id number]  History of Present Illness: This is a 89 y.o. female who was seen by Dr. [name] MD on 12/06/2023 for possible IVC filter placement due to known pulmonary embolism and unable to anticoagulate due to GI diverticulitis.  Patient was set up for outpatient IVC filter placement on 12/14/2023 by vein and vascular.  However patient presents to [facility] emergency department via EMS today after she was nauseous [place] this a.m. helped back to bed then had a syncopal episode.  EMS reports on arrival she was 75% O2 sat on room air then 85% saturation on 4 L of oxygen and now 93% on 10 L nonrebreather mask.  On exam this afternoon the patient was resting comfortably in a stretcher in the emergency department.  Heparin infusion was started.  Patient was on 4 L nasal cannula oxygen saturating at 93%.  Patient was tachycardic with labored breathing.  Denied any chest pain but did endorse some shortness of breath.  Blood pressure was soft at 99/77.  Vascular surgery was consulted to evaluate.  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   a.) CT chest 03/09/2023: fusiform dilatation measuring 4.3 cm   Basal cell carcinoma (BCC) of left medial cheek    a.) s/p Mohs surgery 07/13/2022 at [facility]   Bilateral carotid artery disease [facility])    a.) doppler 04/03/2007: 50-75% RICA; b.) s/p LEFT CEA with CorMatrix patch  angioplasty 01/18/2015; c.) doppler 11/02/2016: 40-59% RICA; d.) doppler 01/10/2018: 1-39% RICA   Diverticulosis 08/05/2015   Eczema of both upper extremities 06/10/2021   Heart failure with preserved left ventricular function (HFpEF) (HCC)    a.) TTE 05/17/2017: EF >55%, mild LVH, mild LAE, mild panval regurg; b.) TTE 06/27/2018 : EF >55%, mod LVH, mild LAE, mild panval regurg'; G1dd; c.) TTE 12/07/2019: EF >55%, mild LVH, mild MAC, mild panval regurg; d.) TTE 01/27/2021: EF >55%, mod LVH, triv TR, mild AR/MR, G1DD   Heart murmur    History of right cataract extraction    Hyperlipidemia    Hypertension    Insomnia    a.) on BZO (temazepam) PRN   Irritable bowel syndrome with diarrhea 04/20/2017   LBP (low back pain) 08/05/2015   Melanoma (HCC)    a.) s/p Mohs surgery 02/06/2020 - LEFT medial brow --> V to Y advancement flap   Melena    Non-small cell lung cancer (NSCLC) (HCC) 03/09/2023   a.) CT chest 03/09/2023 --> superior segment LLL --> 2.1 x 1.7 x 2.1 cm mixed ground-glass and subpleural nodule extending to pleura with irreg margins --> concerning for primary bronchogenic carcinoma rather than metastatic disease; b.) 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 (60 Gy over 6 fractions)   OAB (overactive bladder)    PONV (postoperative nausea and vomiting)    a.) single episode following cataract surgery  PSVT (paroxysmal supraventricular tachycardia) (HCC) 02/22/2015   a.) holter 02/22/2015: 6 runs with longest lasting 10 beats   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   a.) MRI brain 04/02/2007 - subacute RIGHT pontine CVA   Stroke (HCC) 12/16/2014   a.) MRI brain 12/16/2014: punctate foci of restricted diffusion x 5 consistent with microembolic infarctions affecting the RIGHT lateral medulla, LEFT temporal lobe, 2 foci in the RIGHT thalamus, and LEFT inferior  frontal lobe/basal ganglia --> distribution would suggest cardiac or ascending aortic source   T2DM (type 2 diabetes mellitus)  (HCC)    Tubular adenoma of colon     Past Surgical History:  Procedure Laterality Date   APPENDECTOMY     BREAST BIOPSY Right    neg   BREAST SURGERY     1980's   CAROTID ENDARTERECTOMY Left 01/18/2015   Procedure: CAROTID ENDARTERECTOMY; Location: [facility]; Surgeon: [name], MD   CATARACT EXTRACTION Right 2012   CHOLECYSTECTOMY  06/27/2012   COLONOSCOPY N/A 10/05/2023   Procedure: COLONOSCOPY;  Surgeon: Toledo, Boykin Nearing, MD;  Location: [facility] ENDOSCOPY;  Service: Gastroenterology;  Laterality: N/A;   COLONOSCOPY WITH PROPOFOL N/A 02/17/2023   Procedure: COLONOSCOPY WITH PROPOFOL;  Surgeon: Regis Bill, MD;  Location: [facility] ENDOSCOPY;  Service: Endoscopy;  Laterality: N/A;   DECOMPRESSIVE LUMBAR LAMINECTOMY LEVEL 1  1950   ESOPHAGEAL BRUSHING  10/03/2023   Procedure: ESOPHAGEAL BRUSHING;  Surgeon: Norma Fredrickson, Boykin Nearing, MD;  Location: [facility] ENDOSCOPY;  Service: Gastroenterology;;   ESOPHAGOGASTRODUODENOSCOPY N/A 10/03/2023   Procedure: ESOPHAGOGASTRODUODENOSCOPY (EGD);  Surgeon: Toledo, Boykin Nearing, MD;  Location: [facility] ENDOSCOPY;  Service: Gastroenterology;  Laterality: N/A;   ESOPHAGOGASTRODUODENOSCOPY (EGD) WITH PROPOFOL N/A 02/16/2023   Procedure: ESOPHAGOGASTRODUODENOSCOPY (EGD) WITH PROPOFOL;  Surgeon: Regis Bill, MD;  Location: [facility] ENDOSCOPY;  Service: Endoscopy;  Laterality: N/A;   MELANOMA EXCISION Left 02/06/2020   Procedure: MOHS SURGERY WITH V TO Y ADVANCEMENT FLAP (LEFT MEDIAL EYEBROW); Location: [facility]   MOHS SURGERY Left 07/13/2022   Procedure: MOHS SURGERY (BCC LEFT MEDIAL CHEEK); Location: [facility]    Allergies  Allergen Reactions   Codeine    Nitrofurantoin     Other reaction(s): Other (See Comments)   Tramadol Nausea Only   Doxycycline Rash   Penicillins Other (See Comments) and Rash   Sulfa Antibiotics Rash    Prior to Admission medications   Medication Sig Start Date End Date Taking? Authorizing Provider  atorvastatin (LIPITOR) 40 MG tablet Take  1 tablet (40 mg total) by mouth daily. 07/13/23  Yes Merita Norton T, FNP  cholecalciferol (VITAMIN D) 1000 UNITS tablet Take 1,000 Units by mouth daily.    Yes [provider]  glipiZIDE (GLUCOTROL XL) 5 MG 24 hr tablet Take 1 tablet (5 mg total) by mouth daily with breakfast. 08/26/23  Yes Jacky Kindle, FNP  nystatin (MYCOSTATIN) 100000 UNIT/ML suspension Take 5 mLs (500,000 Units total) by mouth 4 (four) times daily for 14 days. Shake well before use.  Measure liquid doses carefully to 5mL  Swish it in your mouth as long as you can before swallowing. 12/01/23 12/15/23 Yes Sallee Provencal, FNP  Polyethylene Glycol 3350 (MIRALAX PO) Take by mouth.   Yes [provider]  simethicone (MYLICON) 125 MG chewable tablet Chew 1 tablet (125 mg total) by mouth every 6 (six) hours as needed for flatulence. 11/02/23  Yes Jacky Kindle, FNP  temazepam (RESTORIL) 15 MG capsule TAKE 1 CAPSULE BY MOUTH AT BEDTIME AS NEEDED FOR SLEEP 07/13/23  Yes Suzie Portela,  Daryl Eastern, FNP  PARoxetine (PAXIL) 10 MG tablet Take 1 tablet (10 mg total) by mouth daily. Patient not taking: Reported on 11/25/2023 10/13/23   Jacky Kindle, FNP    Social History   Socioeconomic History   Marital status: Widowed    Spouse name: Not on file   Number of children: 2   Years of education: Not on file   Highest education level: 12th grade  Occupational History   Occupation: retired  Tobacco Use   Smoking status: Former    Current packs/day: 0.00    Types: Cigarettes    Quit date: 11/15/1976    Years since quitting: 47.0   Smokeless tobacco: Never  Vaping Use   Vaping status: Never Used  Substance and Sexual Activity   Alcohol use: No   Drug use: No   Sexual activity: Not Currently    Birth control/protection: Post-menopausal  Other Topics Concern   Not on file  Social History Narrative   Not on file   Social Drivers of Health   Financial Resource Strain: Low Risk  (05/09/2020)   Overall Financial Resource  Strain (CARDIA)    Difficulty of Paying Living Expenses: Not hard at all  Food Insecurity: No Food Insecurity (10/08/2023)   Hunger Vital Sign    Worried About Running Out of Food in the Last Year: Never true    Ran Out of Food in the Last Year: Never true  Transportation Needs: No Transportation Needs (10/08/2023)   PRAPARE - Administrator, Civil Service (Medical): No    Lack of Transportation (Non-Medical): No  Physical Activity: Inactive (05/09/2020)   Exercise Vital Sign    Days of Exercise per Week: 0 days    Minutes of Exercise per Session: 0 min  Stress: No Stress Concern Present (05/09/2020)   [employer] of Occupational Health - Occupational Stress Questionnaire    Feeling of Stress : Not at all  Social Connections: Moderately Isolated (05/09/2020)   Social Connection and Isolation Panel [NHANES]    Frequency of Communication with Friends and Family: Twice a week    Frequency of Social Gatherings with Friends and Family: Twice a week    Attends Religious Services: More than 4 times per year    Active Member of [employer] or Organizations: No    Attends Banker Meetings: Never    Marital Status: Widowed  Intimate Partner Violence: Not At Risk (10/08/2023)   Humiliation, Afraid, Rape, and Kick questionnaire    Fear of Current or Ex-Partner: No    Emotionally Abused: No    Physically Abused: No    Sexually Abused: No     Family History  Problem Relation Age of Onset   Ovarian cancer Mother    Heart disease Brother    Heart attack Brother    Gout Brother    Alzheimer's disease Brother    Dementia Brother    Heart attack Brother    Diabetes Daughter    Melanoma Daughter    Stroke Son    Breast cancer Neg Hx     ROS: Otherwise negative unless mentioned in HPI  Physical Examination  Vitals:   12/07/23 1400 12/07/23 1430  BP: 108/70 111/69  Pulse: (!) 106 (!) 107  Resp: (!) 25 (!) 25  Temp:    SpO2: 92% 94%   Body mass index is  21.37 kg/m.  General:  WDWN in NAD Gait: Not observed HENT: WNL, normocephalic Pulmonary: normal non-labored breathing, without Rales,  rhonchi,  wheezing Cardiac: regular, tachycardia 110-120, without  Murmurs, rubs or gallops; without carotid bruits Abdomen: Positive bowel sounds throughout, soft, NT/ND, no masses Skin: without rashes Vascular Exam/Pulses: Palpable pulses throughout but weak.  No lower extremity edema noted. Extremities: without ischemic changes, without Gangrene , without cellulitis; without open wounds;  Musculoskeletal: no muscle wasting or atrophy  Neurologic: A&O X 3;  No focal weakness or paresthesias are detected; speech is fluent/normal Psychiatric:  The pt has Normal affect. Lymph:  Unremarkable  CBC    Component Value Date/Time   WBC 10.3 12/07/2023 1224   RBC 5.00 12/07/2023 1224   HGB 13.2 12/07/2023 1224   HGB 8.5 (L) 10/13/2023 1417   HCT 42.3 12/07/2023 1224   HCT 27.7 (L) 10/13/2023 1417   PLT 222 12/07/2023 1224   PLT 373 10/13/2023 1417   MCV 84.6 12/07/2023 1224   MCV 88 10/13/2023 1417   MCV 89 12/16/2014 0315   MCH 26.4 12/07/2023 1224   MCHC 31.2 12/07/2023 1224   RDW 17.6 (H) 12/07/2023 1224   RDW 13.3 10/13/2023 1417   RDW 13.0 12/16/2014 0315   LYMPHSABS 0.8 12/07/2023 1224   LYMPHSABS 1.7 10/13/2023 1417   LYMPHSABS 3.3 12/16/2014 0315   MONOABS 0.5 12/07/2023 1224   MONOABS 0.7 12/16/2014 0315   EOSABS 0.0 12/07/2023 1224   EOSABS 0.1 10/13/2023 1417   EOSABS 0.1 12/16/2014 0315   BASOSABS 0.0 12/07/2023 1224   BASOSABS 0.0 10/13/2023 1417   BASOSABS 0.0 12/16/2014 0315    BMET    Component Value Date/Time   NA 136 12/07/2023 1225   NA 137 10/13/2023 1417   NA 142 12/16/2014 0315   K 4.6 12/07/2023 1225   K 3.3 (L) 12/17/2014 1700   CL 100 12/07/2023 1225   CL 107 12/16/2014 0315   CO2 23 12/07/2023 1225   CO2 28 12/16/2014 0315   GLUCOSE 226 (H) 12/07/2023 1225   GLUCOSE 121 (H) 12/16/2014 0315   BUN 11  12/07/2023 1225   BUN 12 10/13/2023 1417   BUN 15 12/16/2014 0315   CREATININE 0.81 12/07/2023 1225   CREATININE 0.94 12/16/2014 0315   CALCIUM 8.8 (L) 12/07/2023 1225   CALCIUM 9.1 12/16/2014 0315   GFRNONAA >60 12/07/2023 1225   GFRNONAA >60 12/16/2014 0315   GFRNONAA >60 09/16/2013 1902   GFRAA 82 05/14/2020 0931   GFRAA >60 12/16/2014 0315   GFRAA >60 09/16/2013 1902    COAGS: Lab Results  Component Value Date   INR 1.3 (H) 12/07/2023   INR 1.3 (H) 10/03/2023   INR 1.0 12/15/2014     Non-Invasive Vascular Imaging:   EXAM:12/07/23 CT ANGIOGRAPHY CHEST WITH CONTRAST   TECHNIQUE: Multidetector CT imaging of the chest was performed using the standard protocol during bolus administration of intravenous contrast. Multiplanar CT image reconstructions and MIPs were obtained to evaluate the vascular anatomy.   RADIATION DOSE REDUCTION: This exam was performed according to the departmental dose-optimization program which includes automated exposure control, adjustment of the mA and/or kV according to patient size and/or use of iterative reconstruction technique.   CONTRAST:  75mL OMNIPAQUE IOHEXOL 350 MG/ML SOLN   COMPARISON:  CTA of the chest on 11/23/2023   FINDINGS: Cardiovascular: Pulmonary embolism again noted on the right. Thrombus burden is greater than on the prior standard CT likely representing some interval increased thromboembolism since the prior study with increased thrombus at the bifurcation of the right main pulmonary artery with nonocclusive thrombus extending into upper, middle  lobe and lower lobe pulmonary arteries. Most significant thrombus extends into the lower lobe pulmonary artery. No thrombus is identified on the left. Dilated central pulmonary arteries again present with the main pulmonary artery measuring 3.2 cm. There is likely some degree of right heart strain with estimated RV/LV ratio of roughly 1.45.   Stable aneurysmal disease of the  ascending thoracic aorta measuring up to approximately 4.1 cm with associated calcified aortic valve and atherosclerosis of the thoracic aorta. The heart size is normal. Heavily calcified mitral valve annulus again noted. No pericardial fluid. Calcified coronary artery plaque present.   Mediastinum/Nodes: No enlarged mediastinal, hilar, or axillary lymph nodes. Thyroid gland, trachea, and esophagus demonstrate no significant findings.   Lungs/Pleura: Stable consolidative opacities in the posterior left upper lobe, inferior lingula and posterior left lower lobe. Some of these are again likely related to prior radiation therapy. Some degree pneumonia in the left lung cannot be excluded. There is a is stable small amount of left pleural fluid. Small slightly more prominent peripheral opacities in the lateral left upper lobe and posterior left lower lobe may relate to focal pulmonary infarcts and/or atelectasis. No pneumothorax.   Upper Abdomen: No acute abnormality.   Musculoskeletal: No chest wall abnormality. No acute or significant osseous findings.   Review of the MIP images confirms the above findings.   IMPRESSION: 1. Increased thrombus burden in the right pulmonary arteries since the prior standard CT of 11/23/2023, likely representing interval increased thromboembolic burden. There is likely some degree of right heart strain with estimated RV/LV ratio of roughly 1.45. Consider correlation with echocardiography. 2. Stable consolidative opacities in the posterior left upper lobe, inferior lingula and posterior left lower lobe. Some of these are again likely related to prior radiation therapy. Some degree pneumonia in the left lung cannot be excluded. 3. Stable small amount of left pleural fluid. 4. Small slightly more prominent peripheral opacities in the lateral left upper lobe and posterior left lower lobe may relate to focal pulmonary infarcts and/or atelectasis. 5.  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 6. Aortic atherosclerosis.  Statin:  Yes.   Beta Blocker:  No. Aspirin:  No. ACEI:  No. ARB:  No. CCB use:  No Other antiplatelets/anticoagulants:  No.  Unable to tolerate due to GI bleeding.   ASSESSMENT/PLAN: This is a 89 y.o. female who presents to [facility] emergency department via EMS today after she was nauseated this morning and helped back to bed then having a syncopal episode sliding to the floor.  EMS reports the patient had an oxygen saturation of 75% on room air when they arrived.  Upon workup patient was noted to have a prior pulmonary embolism that has now extended through the right main pulmonary arteries.  She has an RV to LV ratio of 1.45 with heart strain.  She is also noted to have stable consolidative opacities in her left lung which can be pneumonia.  Vascular surgery plans on taking the patient to the vascular lab on 12/08/2023 for a pulmonary thrombectomy with IVC filter placement.  I discussed in detail with the patient and her daughter-in-law at the bedside this afternoon the procedure, benefits, risk, and complications.  They both wish to proceed as soon as possible.  They both verbalized her understanding.  I answered all of their questions this afternoon.  Patient will be made n.p.o. after midnight tonight for procedure tomorrow.   -  I personally reviewed the CT scans and discussed the plan in detail with Dr. [name] MD and he agrees with the plan   Marcie [facility] 12/07/2023 3:26 PM

## 2023-12-07 NOTE — ED Notes (Signed)
Spoke with EDP, hold heparin until we hear back from vascular.

## 2023-12-07 NOTE — ED Notes (Signed)
Unable to get second blood cultures. Had to do ultrasound IV for first set.

## 2023-12-07 NOTE — Assessment & Plan Note (Signed)
Continue outpatient follow up with medical oncology

## 2023-12-07 NOTE — Progress Notes (Signed)
Pharmacy Antibiotic Note  [name] is a 89 y.o. female admitted on 12/07/2023 with pneumonia. Patient presenting with shortness of breath. PMH significant for PE (found on 11/23/23), colon cancer (s/p of partial colectomy), hypertension, hyperlipidemia, DM, diastolic CHF, stroke, GERD. Pharmacy has been consulted for vancomycin dosing.  Plan: Vancomycin load of 1000 mg IV x1 given in ED Start vancomycin 1250 mg IV every 48 hours (eAUC 489, Scr 0.81, IBW used, Vd 0.72 L/kg) Monitor renal function, clinical status, culture data, and LOT F/u MRSA PCR to de-escalate antibiotics as able  Height: 5\' 2"  (157.5 cm) Weight: 53 kg (116 lb 13.5 oz) IBW/kg (Calculated) : 50.1  Temp (24hrs), Avg:97.8 F (36.6 C), Min:97.8 F (36.6 C), Max:97.8 F (36.6 C)  Recent Labs  Lab 12/07/23 1220 12/07/23 1224 12/07/23 1225 12/07/23 1431  WBC  --  10.3  --   --   CREATININE  --   --  0.81  --   LATICACIDVEN 2.9*  --   --  1.7    Estimated Creatinine Clearance: 35 mL/min (by C-G formula based on SCr of 0.81 mg/dL).    Allergies  Allergen Reactions   Codeine    Nitrofurantoin     Other reaction(s): Other (See Comments)   Tramadol Nausea Only   Doxycycline Rash   Penicillins Other (See Comments) and Rash   Sulfa Antibiotics Rash   Antimicrobials this admission: vancomycin 1/21 >>  azithromycin 1/21 x1 Aztreonam 1/21 x1  Dose adjustments this admission:  Microbiology results: 1/21 BCx: pending 1/21 MRSA PCR: pending  Thank you for involving pharmacy in this patient's care.   Rockwell Alexandria, PharmD Clinical Pharmacist 12/07/2023 3:38 PM

## 2023-12-07 NOTE — Assessment & Plan Note (Signed)
-   Insulin SSI with at bedtime coverage ordered ?- Goal inpatient blood glucose levels 140-180 ?

## 2023-12-07 NOTE — Telephone Encounter (Signed)
Spoke with the patient's daughter and she is scheduled with Dr. Gilda Crease on 12/14/23 with a 12:30 pm arrival time to the [facility] for a IVC filter placement. Pre-procedure instructions were discussed and will be sent to Mychart and mailed.

## 2023-12-07 NOTE — ED Notes (Signed)
Informed EDP of lactic 2.9.

## 2023-12-08 ENCOUNTER — Encounter
Admission: EM | Disposition: A | Discharge status: Home Health | Payer: Self-pay | Source: Home / Self Care | Attending: Internal Medicine

## 2023-12-08 DIAGNOSIS — I2699 Other pulmonary embolism without acute cor pulmonale: Secondary | ICD-10-CM | POA: Diagnosis not present

## 2023-12-08 DIAGNOSIS — I2609 Other pulmonary embolism with acute cor pulmonale: Secondary | ICD-10-CM | POA: Diagnosis not present

## 2023-12-08 HISTORY — PX: IVC FILTER INSERTION: CATH118245

## 2023-12-08 HISTORY — PX: PULMONARY THROMBECTOMY: CATH118295

## 2023-12-08 LAB — URINALYSIS, W/ REFLEX TO CULTURE (INFECTION SUSPECTED)
Bacteria, UA: NONE SEEN
Bilirubin Urine: NEGATIVE
Glucose, UA: NEGATIVE mg/dL
Hgb urine dipstick: NEGATIVE
Ketones, ur: NEGATIVE mg/dL
Nitrite: NEGATIVE
Protein, ur: 30 mg/dL — AB
Specific Gravity, Urine: >1.046 — ABNORMAL HIGH (ref 1.005–1.030)
WBC, UA: >50 WBC/hpf (ref 0–5)
pH: 5 (ref 5.0–8.0)

## 2023-12-08 LAB — BASIC METABOLIC PANEL
Anion gap: 8 (ref 5–15)
BUN: 9 mg/dL (ref 8–23)
CO2: 25 mmol/L (ref 22–32)
Calcium: 8.3 mg/dL — ABNORMAL LOW (ref 8.9–10.3)
Chloride: 107 mmol/L (ref 98–111)
Creatinine, Ser: 0.59 mg/dL (ref 0.44–1.00)
GFR, Estimated: >60 mL/min (ref >60)
Glucose, Bld: 103 mg/dL — ABNORMAL HIGH (ref 70–99)
Potassium: 3.5 mmol/L (ref 3.5–5.1)
Sodium: 140 mmol/L (ref 135–145)

## 2023-12-08 LAB — CBC
HCT: 33.1 % — ABNORMAL LOW (ref 36.0–46.0)
Hemoglobin: 10.3 g/dL — ABNORMAL LOW (ref 12.0–15.0)
MCH: 27.2 pg (ref 26.0–34.0)
MCHC: 31.1 g/dL (ref 30.0–36.0)
MCV: 87.6 fL (ref 80.0–100.0)
Platelets: 209 10*3/uL (ref 150–400)
RBC: 3.78 MIL/uL — ABNORMAL LOW (ref 3.87–5.11)
RDW: 17.7 % — ABNORMAL HIGH (ref 11.5–15.5)
WBC: 7.4 10*3/uL (ref 4.0–10.5)
nRBC: 0 % (ref 0.0–0.2)

## 2023-12-08 LAB — GLUCOSE, CAPILLARY
Glucose-Capillary: 162 mg/dL — ABNORMAL HIGH (ref 70–99)
Glucose-Capillary: 131 mg/dL — ABNORMAL HIGH (ref 70–99)
Glucose-Capillary: 144 mg/dL — ABNORMAL HIGH (ref 70–99)

## 2023-12-08 LAB — CBG MONITORING, ED: Glucose-Capillary: 108 mg/dL — ABNORMAL HIGH (ref 70–99)

## 2023-12-08 SURGERY — PULMONARY THROMBECTOMY
Anesthesia: Moderate Sedation

## 2023-12-08 MED ORDER — FENTANYL CITRATE (PF) 100 MCG/2ML IJ SOLN
INTRAMUSCULAR | Status: DC | PRN
  Administered 2023-12-08 (×2): 12.5 ug via INTRAVENOUS

## 2023-12-08 MED ORDER — DIPHENHYDRAMINE HCL 50 MG/ML IJ SOLN: 50.0000 mg | Freq: Once | INTRAMUSCULAR | Status: DC | PRN

## 2023-12-08 MED ORDER — SODIUM CHLORIDE 0.9 % IV SOLN: INTRAVENOUS | Status: DC

## 2023-12-08 MED ORDER — FENTANYL CITRATE PF 50 MCG/ML IJ SOSY: 12.5000 ug | PREFILLED_SYRINGE | Freq: Once | INTRAMUSCULAR | Status: DC | PRN

## 2023-12-08 MED ORDER — MIDAZOLAM HCL 2 MG/2ML IJ SOLN
INTRAMUSCULAR | Status: DC | PRN
  Administered 2023-12-08 (×2): .5 mg via INTRAVENOUS

## 2023-12-08 MED ORDER — HEPARIN SODIUM (PORCINE) 1000 UNIT/ML IJ SOLN
INTRAMUSCULAR | Status: AC
Start: 2023-12-08 — End: ?
  Filled 2023-12-08: qty 10

## 2023-12-08 MED ORDER — APIXABAN 2.5 MG PO TABS
2.5000 mg | ORAL_TABLET | Freq: Two times a day (BID) | ORAL | Status: DC
  Administered 2023-12-08 – 2023-12-12 (×9): 2.5 mg via ORAL
  Filled 2023-12-08 (×10): qty 1

## 2023-12-08 MED ORDER — TEMAZEPAM 15 MG PO CAPS
15.0000 mg | ORAL_CAPSULE | Freq: Every day | ORAL | Status: DC
  Administered 2023-12-08 – 2023-12-11 (×4): 15 mg via ORAL
  Filled 2023-12-08 (×4): qty 1

## 2023-12-08 MED ORDER — ACETAMINOPHEN 325 MG PO TABS
ORAL_TABLET | ORAL | Status: AC
  Filled 2023-12-08: qty 2

## 2023-12-08 MED ORDER — MIDAZOLAM HCL 2 MG/ML PO SYRP: 8.0000 mg | ORAL_SOLUTION | Freq: Once | ORAL | Status: DC | PRN

## 2023-12-08 MED ORDER — IODIXANOL 320 MG/ML IV SOLN
INTRAVENOUS | Status: DC | PRN
  Administered 2023-12-08: 35 mL

## 2023-12-08 MED ORDER — IPRATROPIUM-ALBUTEROL 0.5-2.5 (3) MG/3ML IN SOLN: 3.0000 mL | Freq: Four times a day (QID) | RESPIRATORY_TRACT | Status: DC | PRN

## 2023-12-08 MED ORDER — HEPARIN (PORCINE) IN NACL 2000-0.9 UNIT/L-% IV SOLN
INTRAVENOUS | Status: DC | PRN
  Administered 2023-12-08: 1000 mL

## 2023-12-08 MED ORDER — LIDOCAINE-EPINEPHRINE (PF) 1 %-1:200000 IJ SOLN
INTRAMUSCULAR | Status: DC | PRN
  Administered 2023-12-08: 10 mL

## 2023-12-08 MED ORDER — HEPARIN SODIUM (PORCINE) 1000 UNIT/ML IJ SOLN
INTRAMUSCULAR | Status: DC | PRN
  Administered 2023-12-08: 3000 [IU] via INTRAVENOUS

## 2023-12-08 MED ORDER — INSULIN ASPART 100 UNIT/ML IJ SOLN
INTRAMUSCULAR | Status: AC
  Filled 2023-12-08: qty 1

## 2023-12-08 MED ORDER — FENTANYL CITRATE PF 50 MCG/ML IJ SOSY
PREFILLED_SYRINGE | INTRAMUSCULAR | Status: AC
  Filled 2023-12-08: qty 1

## 2023-12-08 MED ORDER — MIDAZOLAM HCL 2 MG/2ML IJ SOLN
INTRAMUSCULAR | Status: AC
  Filled 2023-12-08: qty 2

## 2023-12-08 MED ORDER — FAMOTIDINE 20 MG PO TABS: 40.0000 mg | ORAL_TABLET | Freq: Once | ORAL | Status: DC | PRN

## 2023-12-08 MED ORDER — CEFAZOLIN SODIUM-DEXTROSE 2-4 GM/100ML-% IV SOLN
2.0000 g | INTRAVENOUS | Status: DC
  Filled 2023-12-08: qty 100

## 2023-12-08 MED ORDER — METHYLPREDNISOLONE SODIUM SUCC 125 MG IJ SOLR: 125.0000 mg | Freq: Once | INTRAMUSCULAR | Status: DC | PRN

## 2023-12-08 MED ORDER — PANTOPRAZOLE SODIUM 40 MG PO TBEC
40.0000 mg | DELAYED_RELEASE_TABLET | Freq: Every day | ORAL | Status: DC
  Administered 2023-12-08 – 2023-12-11 (×4): 40 mg via ORAL
  Filled 2023-12-08 (×5): qty 1

## 2023-12-08 SURGICAL SUPPLY — 21 items
CANISTER PENUMBRA ENGINE (MISCELLANEOUS) IMPLANT
CATH ANGIO 5F PIGTAIL 100CM (CATHETERS) IMPLANT
CATH INDIGO 12XTORQ 100 (CATHETERS) IMPLANT
CATH INDIGO SEP 12 (CATHETERS) IMPLANT
CATH SELECT BERN TIP 5F 130 (CATHETERS) IMPLANT
CLOSURE PERCLOSE PROSTYLE (VASCULAR PRODUCTS) IMPLANT
COVER PROBE ULTRASOUND 5X96 (MISCELLANEOUS) IMPLANT
DEVICE TORQUE (MISCELLANEOUS) IMPLANT
GLIDEWIRE ANGLED SS 035X260CM (WIRE) IMPLANT
KIT FEM OPTION ELITE FILTER (Filter) IMPLANT
NDL ENTRY 21GA 7CM ECHOTIP (NEEDLE) IMPLANT
NEEDLE ENTRY 21GA 7CM ECHOTIP (NEEDLE) ×2 IMPLANT
PACK ANGIOGRAPHY (CUSTOM PROCEDURE TRAY) ×2 IMPLANT
SET INTRO CAPELLA COAXIAL (SET/KITS/TRAYS/PACK) IMPLANT
SHEATH BRITE TIP 6FRX11 (SHEATH) IMPLANT
SUT MNCRL AB 4-0 PS2 18 (SUTURE) IMPLANT
SUT SILK 0 FSL (SUTURE) IMPLANT
SYR MEDRAD MARK 7 150ML (SYRINGE) IMPLANT
TUBING CONTRAST HIGH PRESS 72 (TUBING) IMPLANT
WIRE AMPLATZ SSTIFF .035X260CM (WIRE) IMPLANT
WIRE GUIDERIGHT .035X150 (WIRE) IMPLANT

## 2023-12-08 NOTE — OR Nursing (Signed)
Pt reports throat swelling history with pcn. Patient received multiple antibiotic yesterday. Pharmacy and Dr Gilda Crease notified of meds and times.   Ancef not given

## 2023-12-08 NOTE — Progress Notes (Signed)
PROGRESS NOTE    [name]  OZD:[id number] DOB: June 29, 1929 DOA: 12/07/2023 PCP: Sallee Provencal, FNP  Assessment & Plan:   Principal Problem:   Pulmonary embolism (HCC) Active Problems:   GI bleeding   Hypertension associated with diabetes (HCC)   History of CVA (cerebrovascular accident)   Hyperlipidemia   Colon cancer (HCC)   FTT (failure to thrive) in adult   Diabetes mellitus without complication (HCC)   Depression, recurrent (HCC)   Essential hypertension   DVT (deep venous thrombosis) (HCC)  Assessment and Plan:  Pulmonary embolism: increase thrombus burden in the right pulmonary artery compared to prior CT imaging on 11/23/2023, representing interval increased thromboembolic burden. S/p thrombectomy and IVC filter placement. IV heparin was d/c after procedure as pt has hx of GI bleed and started on low dose eliquis as per vasc surg recs. Will monitor for signs/symptoms of bleeding    GI bleeding: H&H are trending down. No need for a transfusion currently. Will continue to monitor   CAP: continue on IV rocephin, azithromycin, bronchodilators prn & encourage incentive spirometry    DM2: well controlled, HbA1c 7.1. Continue on SSI w/ accuchecks    HLD: continue on statin    Colon cancer: management per onco outpatient    QVZ:DGLOVFIEP thrombus in the mid to distal right popliteal vein.  Thrombus in the right peroneal vein.  Thrombus in the left posterior tibial and peroneal veins. S/p IVC filter placed    DVT prophylaxis: eliquis  Code Status: full Family Communication: discussed pt's care w/ pt's family at bedside and answered their questions  Disposition Plan: depends on PT/OT recs   Level of care: Progressive  Status is: Inpatient Remains inpatient appropriate because: severity of illness   Consultants:  Vasc surg   Procedures:   Antimicrobials: azithromycin, rocephin   Subjective: Pt c/o fatigue   Objective: Vitals:   12/08/23 1003 12/08/23  1008 12/08/23 1010 12/08/23 1015  BP: 130/75 130/75 125/66 126/66  Pulse: 93 (!) 0 91 84  Resp: (!) 23  20 (!) 22  Temp:      TempSrc:      SpO2: 98%  97% 96%  Weight:      Height:        Intake/Output Summary (Last 24 hours) at 12/08/2023 1018 Last data filed at 12/08/2023 1000 Gross per 24 hour  Intake 650 ml  Output 75 ml  Net 575 ml   Filed Weights   12/07/23 1220  Weight: 53 kg    Examination:  General exam: Appears calm and comfortable  Respiratory system: decreased breath sounds b/l  Cardiovascular system: S1 & S2 +. No rubs, gallops or clicks.  Gastrointestinal system: Abdomen is nondistended, soft and nontender. Normal bowel sounds heard. Central nervous system: Alert and oriented.  Psychiatry: Judgement and insight appears at baseline. Flat mood and affect     Data Reviewed: I have personally reviewed following labs and imaging studies  CBC: Recent Labs  Lab 12/07/23 1224 12/08/23 0356  WBC 10.3 7.4  NEUTROABS 8.8*  --   HGB 13.2 10.3*  HCT 42.3 33.1*  MCV 84.6 87.6  PLT 222 209   Basic Metabolic Panel: Recent Labs  Lab 12/07/23 1225 12/08/23 0356  NA 136 140  K 4.6 3.5  CL 100 107  CO2 23 25  GLUCOSE 226* 103*  BUN 11 9  CREATININE 0.81 0.59  CALCIUM 8.8* 8.3*   GFR: Estimated Creatinine Clearance: 35.5 mL/min (by C-G formula based on SCr  of 0.59 mg/dL). Liver Function Tests: Recent Labs  Lab 12/07/23 1225  AST 37  ALT 22  ALKPHOS 121  BILITOT 1.6*  PROT 6.5  ALBUMIN 2.6*   No results for input(s): "LIPASE", "AMYLASE" in the last 168 hours. No results for input(s): "AMMONIA" in the last 168 hours. Coagulation Profile: Recent Labs  Lab 12/07/23 1224  INR 1.3*   Cardiac Enzymes: No results for input(s): "CKTOTAL", "CKMB", "CKMBINDEX", "TROPONINI" in the last 168 hours. BNP (last 3 results) No results for input(s): "PROBNP" in the last 8760 hours. HbA1C: No results for input(s): "HGBA1C" in the last 72 hours. CBG: Recent  Labs  Lab 12/07/23 1728 12/07/23 2223 12/08/23 0804  GLUCAP 195* 103* 108*   Lipid Profile: No results for input(s): "CHOL", "HDL", "LDLCALC", "TRIG", "CHOLHDL", "LDLDIRECT" in the last 72 hours. Thyroid Function Tests: No results for input(s): "TSH", "T4TOTAL", "FREET4", "T3FREE", "THYROIDAB" in the last 72 hours. Anemia Panel: No results for input(s): "VITAMINB12", "FOLATE", "FERRITIN", "TIBC", "IRON", "RETICCTPCT" in the last 72 hours. Sepsis Labs: Recent Labs  Lab 12/07/23 1220 12/07/23 1431  LATICACIDVEN 2.9* 1.7    Recent Results (from the past 240 hours)  Culture, blood (Routine x 2)     Status: None (Preliminary result)   Collection Time: 12/07/23 12:24 PM   Specimen: BLOOD  Result Value Ref Range Status   Specimen Description BLOOD RIGHT ANTECUBITAL  Final   Special Requests   Final    BOTTLES DRAWN AEROBIC AND ANAEROBIC Blood Culture adequate volume   Culture   Final    NO GROWTH < 24 HOURS Performed at [facility], [address] [place], [place] [address]    Report Status PENDING  Incomplete  MRSA Next Gen by PCR, Nasal     Status: None   Collection Time: 12/07/23  5:45 PM   Specimen: Nasal Mucosa; Nasal Swab  Result Value Ref Range Status   MRSA by PCR Next Gen NOT DETECTED NOT DETECTED Final    Comment: (NOTE) The GeneXpert MRSA Assay (FDA approved for NASAL specimens only), is one component of a comprehensive MRSA colonization surveillance program. It is not intended to diagnose MRSA infection nor to guide or monitor treatment for MRSA infections. Test performance is not FDA approved in patients less than 4 years old. Performed at [facility], [address] [place], [place] [address]   Urine Culture     Status: None (Preliminary result)   Collection Time: 12/08/23  7:21 AM   Specimen: Urine, Random  Result Value Ref Range Status   Specimen Description   Final    URINE, RANDOM Performed at [facility], [address] [place], [place] [address]    Special Requests   Final    URINE, CLEAN CATCH Performed at [facility], [address], [place], [place] [address]    Culture PENDING  Incomplete   Report Status PENDING  Incomplete         Radiology Studies: PERIPHERAL VASCULAR CATHETERIZATION Result Date: 12/08/2023 See surgical note for result.  US Venous Img Lower Bilateral (DVT) Result Date: 12/07/2023 CLINICAL DATA:  Pulmonary embolism. EXAM: BILATERAL LOWER EXTREMITY VENOUS DOPPLER ULTRASOUND TECHNIQUE: Gray-scale sonography with graded compression, as well as color Doppler and duplex ultrasound were performed to evaluate the lower extremity deep venous systems from the level of the common femoral vein and including the common femoral, femoral, profunda femoral, popliteal and calf veins including the posterior tibial, peroneal and gastrocnemius veins when visible.  The superficial great saphenous vein was also interrogated. Spectral Doppler was utilized to evaluate flow at rest and with distal augmentation maneuvers in the common femoral, femoral and popliteal veins. COMPARISON:  None Available. FINDINGS: RIGHT LOWER EXTREMITY Common Femoral Vein: No evidence of thrombus. Normal compressibility, respiratory phasicity and response to augmentation. Saphenofemoral Junction: No evidence of thrombus. Normal compressibility and flow on color Doppler imaging. Profunda Femoral Vein: No evidence of thrombus. Normal compressibility and flow on color Doppler imaging. Femoral Vein: No evidence of thrombus. Normal compressibility, respiratory phasicity and response to augmentation. Popliteal Vein: Some flow is present in the proximal popliteal vein. There is occlusive thrombus in the mid to distal segment. Calf Veins: Thrombus in the right peroneal vein. The right posterior tibial vein is patent. Superficial Great Saphenous Vein: No evidence of thrombus. Normal compressibility. Venous Reflux:  None. Other  Findings: No evidence of superficial thrombophlebitis or abnormal fluid collection. LEFT LOWER EXTREMITY Common Femoral Vein: No evidence of thrombus. Normal compressibility, respiratory phasicity and response to augmentation. Saphenofemoral Junction: No evidence of thrombus. Normal compressibility and flow on color Doppler imaging. Profunda Femoral Vein: No evidence of thrombus. Normal compressibility and flow on color Doppler imaging. Femoral Vein: No evidence of thrombus. Normal compressibility, respiratory phasicity and response to augmentation. Popliteal Vein: No evidence of thrombus. Normal compressibility, respiratory phasicity and response to augmentation. Calf Veins: Thrombus in left posterior tibial and peroneal veins. Superficial Great Saphenous Vein: No evidence of thrombus. Normal compressibility. Venous Reflux:  None. Other Findings: No evidence of superficial thrombophlebitis or abnormal fluid collection. IMPRESSION: 1. Occlusive thrombus in the mid to distal right popliteal vein. Thrombus in the right peroneal vein. 2. Thrombus in the left posterior tibial and peroneal veins. Electronically Signed   By: Irish Lack M.D.   On: 12/07/2023 17:08   CT Angio Chest PE W and/or Wo Contrast Result Date: 12/07/2023 CLINICAL DATA:  Syncopal episode, hypoxia and known recent right-sided pulmonary embolism by CT. History of lung carcinoma and colon carcinoma. Prior radiation therapy to the left lower lobe in July. EXAM: CT ANGIOGRAPHY CHEST WITH CONTRAST TECHNIQUE: Multidetector CT imaging of the chest was performed using the standard protocol during bolus administration of intravenous contrast. Multiplanar CT image reconstructions and MIPs were obtained to evaluate the vascular anatomy. RADIATION DOSE REDUCTION: This exam was performed according to the departmental dose-optimization program which includes automated exposure control, adjustment of the mA and/or kV according to patient size and/or use of  iterative reconstruction technique. CONTRAST:  75mL OMNIPAQUE IOHEXOL 350 MG/ML SOLN COMPARISON:  CTA of the chest on 11/23/2023 FINDINGS: Cardiovascular: Pulmonary embolism again noted on the right. Thrombus burden is greater than on the prior standard CT likely representing some interval increased thromboembolism since the prior study with increased thrombus at the bifurcation of the right main pulmonary artery with nonocclusive thrombus extending into upper, middle lobe and lower lobe pulmonary arteries. Most significant thrombus extends into the lower lobe pulmonary artery. No thrombus is identified on the left. Dilated central pulmonary arteries again present with the main pulmonary artery measuring 3.2 cm. There is likely some degree of right heart strain with estimated RV/LV ratio of roughly 1.45. Stable aneurysmal disease of the ascending thoracic aorta measuring up to approximately 4.1 cm with associated calcified aortic valve and atherosclerosis of the thoracic aorta. The heart size is normal. Heavily calcified mitral valve annulus again noted. No pericardial fluid. Calcified coronary artery plaque present. Mediastinum/Nodes: No enlarged mediastinal, hilar, or axillary lymph nodes. Thyroid gland,  trachea, and esophagus demonstrate no significant findings. Lungs/Pleura: Stable consolidative opacities in the posterior left upper lobe, inferior lingula and posterior left lower lobe. Some of these are again likely related to prior radiation therapy. Some degree pneumonia in the left lung cannot be excluded. There is a is stable small amount of left pleural fluid. Small slightly more prominent peripheral opacities in the lateral left upper lobe and posterior left lower lobe may relate to focal pulmonary infarcts and/or atelectasis. No pneumothorax. Upper Abdomen: No acute abnormality. Musculoskeletal: No chest wall abnormality. No acute or significant osseous findings. Review of the MIP images confirms the  above findings. IMPRESSION: 1. Increased thrombus burden in the right pulmonary arteries since the prior standard CT of 11/23/2023, likely representing interval increased thromboembolic burden. There is likely some degree of right heart strain with estimated RV/LV ratio of roughly 1.45. Consider correlation with echocardiography. 2. Stable consolidative opacities in the posterior left upper lobe, inferior lingula and posterior left lower lobe. Some of these are again likely related to prior radiation therapy. Some degree pneumonia in the left lung cannot be excluded. 3. Stable small amount of left pleural fluid. 4. Small slightly more prominent peripheral opacities in the lateral left upper lobe and posterior left lower lobe may relate to focal pulmonary infarcts and/or atelectasis. 5. 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 6. Aortic atherosclerosis. Aortic aneurysm NOS (ICD10-I71.9). Electronically Signed   By: Irish Lack M.D.   On: 12/07/2023 14:59   DG Chest 2 View Result Date: 12/07/2023 CLINICAL DATA:  Suspected sepsis. EXAM: CHEST - 2 VIEW COMPARISON:  CT chest dated November 23, 2023. FINDINGS: The mediastinum remains shifted to the left with similar volume loss and patchy airspace opacification throughout much of the left lung, presumably radiation related. There are a few irregular opacities in the central and peripheral right upper lobe. Unchanged small left pleural effusion. No pneumothorax. No acute osseous abnormality. IMPRESSION: 1. Irregular opacities in the central and peripheral right upper lobe, concerning for pneumonia. 2. Similar volume loss and post radiation change in the left lung. Electronically Signed   By: Obie Dredge M.D.   On: 12/07/2023 14:28        Scheduled Meds:  [MAR Hold] atorvastatin  40 mg Oral QHS   [MAR Hold] cholecalciferol  1,000 Units  Oral Daily   [MAR Hold] insulin aspart  0-15 Units Subcutaneous TID WC   [MAR Hold] insulin aspart  0-5 Units Subcutaneous QHS   [MAR Hold] PARoxetine  10 mg Oral Daily   Continuous Infusions:  [MAR Hold] azithromycin Stopped (12/07/23 1645)   [MAR Hold] cefTRIAXone (ROCEPHIN)  IV Stopped (12/07/23 2347)   heparin Stopped (12/08/23 0915)   [MAR Hold] vancomycin       LOS: 1 day       Charise Killian, MD [facility] Pager [phone]  If 7PM-7AM, please contact night-coverage  12/08/2023, 10:18 AM

## 2023-12-08 NOTE — ED Notes (Signed)
All belongings taken w/ family.  Report given to [facility] RN.

## 2023-12-08 NOTE — Plan of Care (Signed)
  Problem: Education: Goal: Ability to describe self-care measures that may prevent or decrease complications (Diabetes Survival Skills Education) will improve Outcome: Progressing Goal: Individualized Educational Video(s) Outcome: Progressing   Problem: Coping: Goal: Ability to adjust to condition or change in health will improve Outcome: Progressing   

## 2023-12-08 NOTE — Progress Notes (Signed)
PHARMACY - ANTICOAGULATION CONSULT NOTE  Pharmacy Consult for heparin Indication: pulmonary embolus  Allergies  Allergen Reactions   Codeine    Nitrofurantoin     Other reaction(s): Other (See Comments)   Tramadol Nausea Only   Doxycycline Rash   Penicillins Swelling, Rash and Other (See Comments)    Throat swelling    Sulfa Antibiotics Rash    Patient Measurements: Height: 5\' 2"  (157.5 cm) Weight: 53 kg (116 lb 13.5 oz) IBW/kg (Calculated) : 50.1 Heparin Dosing Weight: 53 kg  Vital Signs: Temp: 97.9 F (36.6 C) (01/22 0800) Temp Source: Oral (01/22 0800) BP: 106/53 (01/22 1300) Pulse Rate: 85 (01/22 1300)  Labs: Recent Labs    12/07/23 1224 12/07/23 1225 12/07/23 1431 12/07/23 2309 12/08/23 0356  HGB 13.2  --   --   --  10.3*  HCT 42.3  --   --   --  33.1*  PLT 222  --   --   --  209  LABPROT 16.1*  --   --   --   --   INR 1.3*  --   --   --   --   HEPARINUNFRC  --   --   --  0.16*  --   CREATININE  --  0.81  --   --  0.59  TROPONINIHS  --  575* 633*  --   --     Estimated Creatinine Clearance: 35.5 mL/min (by C-G formula based on SCr of 0.59 mg/dL).   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   a.) CT chest 03/09/2023: fusiform dilatation measuring 4.3 cm   Basal cell carcinoma (BCC) of left medial cheek    a.) s/p Mohs surgery 07/13/2022 at [facility]   Bilateral carotid artery disease [facility])    a.) doppler 04/03/2007: 50-75% RICA; b.) s/p LEFT CEA with CorMatrix patch angioplasty  01/18/2015; c.) doppler 11/02/2016: 40-59% RICA; d.) doppler 01/10/2018: 1-39% RICA   Diverticulosis 08/05/2015   Eczema of both upper extremities 06/10/2021   Heart failure with preserved left ventricular function (HFpEF) (HCC)    a.) TTE 05/17/2017: EF >55%, mild LVH, mild LAE, mild panval regurg; b.) TTE 06/27/2018 : EF >55%, mod LVH, mild LAE, mild panval regurg'; G1dd; c.) TTE 12/07/2019: EF >55%, mild LVH, mild MAC, mild panval regurg; d.) TTE 01/27/2021: EF >55%, mod LVH, triv TR, mild AR/MR, G1DD   Heart murmur    History of right cataract extraction    Hyperlipidemia    Hypertension    Insomnia    a.) on BZO (temazepam) PRN   Irritable bowel syndrome with diarrhea 04/20/2017   LBP (low back pain) 08/05/2015   Melanoma (HCC)    a.) s/p Mohs surgery 02/06/2020 - LEFT medial brow --> V to Y advancement flap   Melena    Non-small cell lung cancer (NSCLC) (HCC) 03/09/2023   a.) CT chest 03/09/2023 --> superior segment LLL --> 2.1 x 1.7 x 2.1 cm mixed ground-glass and subpleural nodule  extending to pleura with irreg margins --> concerning for primary bronchogenic carcinoma rather than metastatic disease; b.) 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 (60 Gy over 6 fractions)   OAB (overactive bladder)    PONV (postoperative nausea and vomiting)    a.) single episode following cataract surgery   PSVT (paroxysmal supraventricular tachycardia) (HCC) 02/22/2015   a.) holter 02/22/2015: 6 runs with longest lasting 10 beats   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   a.) MRI brain 04/02/2007 - subacute RIGHT pontine CVA   Stroke (HCC) 12/16/2014   a.) MRI brain 12/16/2014: punctate foci of restricted diffusion x 5 consistent with microembolic infarctions affecting the RIGHT lateral medulla, LEFT temporal lobe, 2 foci in the RIGHT thalamus, and LEFT inferior  frontal lobe/basal ganglia --> distribution would suggest cardiac or ascending aortic source   T2DM (type 2 diabetes mellitus) (HCC)     Tubular adenoma of colon    Assessment: 89 y/o female presenting with syncope. PMH significant for PE (diagnosed on 11/23/23) not on anticoagulation due to extensive history of GI bleeding, colon cancer (s/p of partial colectomy), hypertension, hyperlipidemia, DM, diastolic CHF, stroke, GERD. Pharmacy has been consulted to initiate heparin infusion. Per vascular, will initiate infusion without bolus given bleeding history.   Baseline labs: Hgb 13.2, plt 222, INR 1.3  Goal of Therapy:  Heparin level 0.3-0.7 units/ml Monitor platelets by anticoagulation protocol: Yes  1/21 2309 HL 0.16, subtherapeutic   Plan:  Patient is s/p IVC filter placement on 1/22 Per MD, will transition from heparin to Eliquis Stop heparin infusion and start Eliquis 2.5 mg BID Monitor CBC and signs/symptoms of bleeding  Thank you for involving pharmacy in this patient's care.   Rockwell Alexandria, PharmD Clinical Pharmacist 12/08/2023 1:35 PM

## 2023-12-08 NOTE — Plan of Care (Signed)

## 2023-12-08 NOTE — Op Note (Signed)
[facility]  Percutaneous Study/Intervention Procedural Note   Date of Surgery: 12/08/2023,10:13 AM  Surgeon:Kamsiyochukwu Buist, Latina Craver   Pre-operative Diagnosis: Symptomatic pulmonary emboli with right heart strain and hypoxia  Post-operative diagnosis:  Same  Procedure(s) Performed:  1.  Contrast injection right heart and bilateral pulmonary arteries  2.  Mechanical thrombectomy right lobar pulmonary arteries for removal of pulmonary emboli using the Penumbra CAT 12 thrombectomy catheter.  3.  Selective catheter placement right upper lobe pulmonary artery, middle lobe pulmonary artery and lower lobe pulmonary artery  4.  Placement of an option Elite IVC filter in the infrarenal inferior vena cava.             5.  Perclose right common femoral venotomy    Anesthesia: Conscious sedation was administered under my direct supervision by the interventional radiology RN. IV Versed plus fentanyl were utilized. Continuous ECG, pulse oximetry and blood pressure was monitored throughout the entire procedure.  Versed and fentanyl were administered intravenously.  Conscious sedation was administered for a total of 38 minutes.  Sheath: 12 French Cook sheath right common femoral vein antegrade  Contrast: 35 cc   Fluoroscopy Time: 6.2 minutes  Indications:  Patient presents with pulmonary emboli. The patient is symptomatic with hypoxemia and dyspnea on exertion.  There is evidence of right heart strain on the CT angiogram. The patient is otherwise a good candidate for intervention and even the long-term benefits pulmonary angiography with thrombolysis is offered. The risks and benefits are reviewed long-term benefits are discussed. All questions are answered patient agrees to proceed.  Procedure:  [name] a 89 y.o. female who was identified and appropriate procedural time out was performed.  The patient was then placed supine on the table and prepped and draped in the usual  sterile fashion.  Ultrasound was used to evaluate the right common femoral vein.  It was patent, as it was echolucent and compressible.  A digital ultrasound image was acquired for the permanent record.  A micropuncture needle was used to access the right common femoral vein under direct ultrasound guidance.  A microwire was then advanced under fluoroscopic guidance followed by micro-sheath.  A 0.035 J wire was advanced without resistance and a 5Fr sheath was placed.  Perclose devices were then used in a preclose fashion and then upsized to an 12 [place] sheath.    The wire and pigtail catheter were then negotiated into the right atrium and bolus injection of contrast was utilized to demonstrate the right ventricle and the pulmonary artery outflow.   3000 units of heparin was then given and allowed to circulate.  TPA was not used given her history of GI bleed.  The J-wire and pigtail catheter was advanced up to the right atrium where a bolus injection contrast was used to demonstrate the pulmonary artery outflow.  Stiff angled Glidewire was then exchanged for the J-wire and the pigtail catheter was used to select the pulmonary outflow track.  The right main pulmonary artery was evaluated first.  A select catheter was then advanced over the Amplatz wire into the distal right main pulmonary artery and hand-injection confirmed the thrombus.The Amplatz wire was positioned through the select catheter and the select catheter removed.  The Penumbra Cat 12 extra torque catheter was then advanced into the thrombus in the right lower lobe pulmonary artery.  Hand-injection contrast was used to verify positioning and evaluate the distal anatomy.  Mechanical aspiration was performed using the CAT 12 catheter and  a separator.  After multiple passes the catheter was then repositioned into the right middle lobe pulmonary artery and again hand-injection contrast was performed to verify position and evaluate the distal  anatomy.  Multiple passes were made using mechanical aspiration in association with a separator.  Lastly, using a combination of the separator catheter and Glidewire the CAT 12 device was negotiated into the right upper lobe pulmonary artery.  Hand-injection of contrast was used to verify positioning and evaluate the distal anatomy.  Multiple passes were then performed using the separator with the CAT 12 penumbra catheter.  Once there was free flow of blood from all 3 lobar arteries the catheter was repositioned to the right main pulmonary artery.  Hand-injection contrast was then used to give an assessment of the effectiveness of thrombectomy.  The catheter was then reintroduced over the Amplatz wire after removing the penumbra catheter and positioned in the pulmonary outflow tract.  A bolus injection of contrast was then used to create a final image of the pulmonary vasculature.  Hand-injection of contrast through the common femoral venous sheath was then performed to image the inferior vena cava.  Inferior vena cava was widely patent measuring 18 mm in diameter and the renal blushes were noted at the L2 level.  The wire was reintroduced and the argon option Elite sheath advanced over the wire through the 12 [place] sheath.  It was positioned at the level of L2-L3.  The option Elite filter was then delivered through the sheath and the filter deployed in excellent orientation at the L3 level.  After review these images the catheter and sheath were removed and the Perclose device was secured with good hemostasis.  Pressure was held. There were no immediate complications.    Findings:   Right heart imaging:  Right atrium and right ventricle and the pulmonary outflow tract appears normal  Right lung: The initial images of the right lung demonstrate thrombus within the distal right main pulmonary artery extending into the right upper, middle and lower lobar arteries.  There is thrombus extending into the  segmental branches as well.  Following thrombectomy there appears to be near total resolution of the previously identified thrombus.  Inferior vena cava is widely patent measures 18 mm in diameter.  Option Elite filter is deployed in good orientation in the infrarenal position at the L3 level.    Disposition: Patient was taken to the recovery room in stable condition having tolerated the procedure well.  Earl Lites Keylah Darwish 12/08/2023,10:13 AM

## 2023-12-08 NOTE — Progress Notes (Signed)
PHARMACY - ANTICOAGULATION CONSULT NOTE  Pharmacy Consult for heparin Indication: pulmonary embolus  Allergies  Allergen Reactions   Codeine    Nitrofurantoin     Other reaction(s): Other (See Comments)   Tramadol Nausea Only   Doxycycline Rash   Penicillins Other (See Comments) and Rash   Sulfa Antibiotics Rash    Patient Measurements: Height: 5\' 2"  (157.5 cm) Weight: 53 kg (116 lb 13.5 oz) IBW/kg (Calculated) : 50.1 Heparin Dosing Weight: 53 kg  Vital Signs: Temp: 98 F (36.7 C) (01/21 2100) Temp Source: Axillary (01/21 1737) BP: 107/70 (01/22 0000) Pulse Rate: 98 (01/22 0000)  Labs: Recent Labs    12/07/23 1224 12/07/23 1225 12/07/23 1431 12/07/23 2309  HGB 13.2  --   --   --   HCT 42.3  --   --   --   PLT 222  --   --   --   LABPROT 16.1*  --   --   --   INR 1.3*  --   --   --   HEPARINUNFRC  --   --   --  0.16*  CREATININE  --  0.81  --   --   TROPONINIHS  --  575* 633*  --     Estimated Creatinine Clearance: 35 mL/min (by C-G formula based on SCr of 0.81 mg/dL).   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   a.) CT chest 03/09/2023: fusiform dilatation measuring 4.3 cm   Basal cell carcinoma (BCC) of left medial cheek    a.) s/p Mohs surgery 07/13/2022 at [facility]   Bilateral carotid artery disease [facility])    a.) doppler 04/03/2007: 50-75% RICA; b.) s/p LEFT CEA with CorMatrix patch angioplasty 01/18/2015; c.) doppler 11/02/2016: 40-59% RICA; d.) doppler 01/10/2018: 1-39% RICA    Diverticulosis 08/05/2015   Eczema of both upper extremities 06/10/2021   Heart failure with preserved left ventricular function (HFpEF) (HCC)    a.) TTE 05/17/2017: EF >55%, mild LVH, mild LAE, mild panval regurg; b.) TTE 06/27/2018 : EF >55%, mod LVH, mild LAE, mild panval regurg'; G1dd; c.) TTE 12/07/2019: EF >55%, mild LVH, mild MAC, mild panval regurg; d.) TTE 01/27/2021: EF >55%, mod LVH, triv TR, mild AR/MR, G1DD   Heart murmur    History of right cataract extraction    Hyperlipidemia    Hypertension    Insomnia    a.) on BZO (temazepam) PRN   Irritable bowel syndrome with diarrhea 04/20/2017   LBP (low back pain) 08/05/2015   Melanoma (HCC)    a.) s/p Mohs surgery 02/06/2020 - LEFT medial brow --> V to Y advancement flap   Melena    Non-small cell lung cancer (NSCLC) (HCC) 03/09/2023   a.) CT chest 03/09/2023 --> superior segment LLL --> 2.1 x 1.7 x 2.1 cm mixed ground-glass and subpleural nodule extending to pleura with irreg margins --> concerning for primary bronchogenic carcinoma rather than metastatic disease; b.) 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 (  60 Gy over 6 fractions)   OAB (overactive bladder)    PONV (postoperative nausea and vomiting)    a.) single episode following cataract surgery   PSVT (paroxysmal supraventricular tachycardia) (HCC) 02/22/2015   a.) holter 02/22/2015: 6 runs with longest lasting 10 beats   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   a.) MRI brain 04/02/2007 - subacute RIGHT pontine CVA   Stroke (HCC) 12/16/2014   a.) MRI brain 12/16/2014: punctate foci of restricted diffusion x 5 consistent with microembolic infarctions affecting the RIGHT lateral medulla, LEFT temporal lobe, 2 foci in the RIGHT thalamus, and LEFT inferior  frontal lobe/basal ganglia --> distribution would suggest cardiac or ascending aortic source   T2DM (type 2 diabetes mellitus) (HCC)    Tubular adenoma of colon    Assessment: 89 y/o female presenting with syncope. PMH  significant for PE (diagnosed on 11/23/23) not on anticoagulation due to extensive history of GI bleeding, colon cancer (s/p of partial colectomy), hypertension, hyperlipidemia, DM, diastolic CHF, stroke, GERD. Pharmacy has been consulted to initiate heparin infusion. Per vascular, will initiate infusion without bolus given bleeding history.   Baseline labs: Hgb 13.2, plt 222, INR 1.3  Goal of Therapy:  Heparin level 0.3-0.7 units/ml Monitor platelets by anticoagulation protocol: Yes  1/21 2309 HL 0.16, subtherapeutic   Plan:  Per vascular, witll not give bolus due to extensive history of GI bleeding Increase heparin infusion to 950 units/hr Recheck HL in 8 hrs after rate change CBC daily while on heparin.  Otelia Sergeant, PharmD, [facility] 12/08/2023 12:08 AM

## 2023-12-08 NOTE — Interval H&P Note (Signed)
History and Physical Interval Note:  12/08/2023 9:26 AM  [name]  has presented today for surgery, with the diagnosis of Pulmonary embolism.  The various methods of treatment have been discussed with the patient and family. After consideration of risks, benefits and other options for treatment, the patient has consented to  Procedure(s): PULMONARY THROMBECTOMY (Bilateral) IVC FILTER INSERTION (N/A) as a surgical intervention.  The patient's history has been reviewed, patient examined, no change in status, stable for surgery.  I have reviewed the patient's chart and labs.  Questions were answered to the patient's satisfaction.     [name]

## 2023-12-09 ENCOUNTER — Telehealth (HOSPITAL_COMMUNITY): Payer: Self-pay | Admitting: Pharmacy Technician

## 2023-12-09 ENCOUNTER — Encounter: Payer: Self-pay | Admitting: Vascular Surgery

## 2023-12-09 ENCOUNTER — Inpatient Hospital Stay: Admit: 2023-12-09 | Payer: Medicare Other

## 2023-12-09 ENCOUNTER — Inpatient Hospital Stay
Admit: 2023-12-09 | Discharge: 2023-12-09 | Disposition: A | Discharge status: Home / Self Care | Payer: Medicare Other | Attending: Vascular Surgery | Admitting: Vascular Surgery

## 2023-12-09 ENCOUNTER — Encounter: Payer: Self-pay | Admitting: Oncology

## 2023-12-09 ENCOUNTER — Inpatient Hospital Stay (HOSPITAL_COMMUNITY)
Admit: 2023-12-09 | Discharge: 2023-12-09 | Disposition: A | Discharge status: Home / Self Care | Payer: Medicare Other | Attending: Vascular Surgery | Admitting: Vascular Surgery

## 2023-12-09 ENCOUNTER — Other Ambulatory Visit (HOSPITAL_COMMUNITY): Payer: Self-pay

## 2023-12-09 DIAGNOSIS — I2609 Other pulmonary embolism with acute cor pulmonale: Secondary | ICD-10-CM

## 2023-12-09 LAB — ECHOCARDIOGRAM COMPLETE
AR max vel: 1.07 cm2
AV Area VTI: 1.33 cm2
AV Area mean vel: 1.01 cm2
AV Mean grad: 18.5 mm[Hg]
AV Peak grad: 29.8 mm[Hg]
Ao pk vel: 2.73 m/s
Area-P 1/2: 2.33 cm2
Height: 62 in
MV VTI: 1.89 cm2
S' Lateral: 2.7 cm
Weight: 1869.5 [oz_av]

## 2023-12-09 LAB — GLUCOSE, CAPILLARY
Glucose-Capillary: 117 mg/dL — ABNORMAL HIGH (ref 70–99)
Glucose-Capillary: 129 mg/dL — ABNORMAL HIGH (ref 70–99)
Glucose-Capillary: 198 mg/dL — ABNORMAL HIGH (ref 70–99)
Glucose-Capillary: 185 mg/dL — ABNORMAL HIGH (ref 70–99)

## 2023-12-09 LAB — CBC
HCT: 31.3 % — ABNORMAL LOW (ref 36.0–46.0)
Hemoglobin: 9.8 g/dL — ABNORMAL LOW (ref 12.0–15.0)
MCH: 26.7 pg (ref 26.0–34.0)
MCHC: 31.3 g/dL (ref 30.0–36.0)
MCV: 85.3 fL (ref 80.0–100.0)
Platelets: 200 10*3/uL (ref 150–400)
RBC: 3.67 MIL/uL — ABNORMAL LOW (ref 3.87–5.11)
RDW: 17.6 % — ABNORMAL HIGH (ref 11.5–15.5)
WBC: 6.3 10*3/uL (ref 4.0–10.5)
nRBC: 0 % (ref 0.0–0.2)

## 2023-12-09 LAB — BASIC METABOLIC PANEL
Anion gap: 8 (ref 5–15)
BUN: 9 mg/dL (ref 8–23)
CO2: 25 mmol/L (ref 22–32)
Calcium: 8.1 mg/dL — ABNORMAL LOW (ref 8.9–10.3)
Chloride: 108 mmol/L (ref 98–111)
Creatinine, Ser: 0.59 mg/dL (ref 0.44–1.00)
GFR, Estimated: >60 mL/min (ref >60)
Glucose, Bld: 132 mg/dL — ABNORMAL HIGH (ref 70–99)
Potassium: 3.7 mmol/L (ref 3.5–5.1)
Sodium: 141 mmol/L (ref 135–145)

## 2023-12-09 MED ORDER — ENSURE ENLIVE PO LIQD
237.0000 mL | Freq: Two times a day (BID) | ORAL | Status: DC
  Administered 2023-12-09 – 2023-12-11 (×5): 237 mL via ORAL

## 2023-12-09 NOTE — Progress Notes (Addendum)
Progress Note    12/09/2023 9:09 AM 1 Day Post-Op  Subjective:  [name] is a 89 yo female now POD #1 from pulmonary thrombectomy.  Patient is resting comfortably in bed this morning but still requiring 4 L nasal cannula oxygen.  Patient endorses she feels much better and is not as short of breath but she has not ambulated at this time.  Patient is requesting to ambulate and use the bathroom rather than a bedpan.  I informed the patient she needs to wait until physical therapy comes and evaluates her at the bedside first.  If she does well we would asked that she can ambulate with assistance   Vitals:   12/08/23 2343 12/09/23 0406  BP: 128/69 (!) 105/51  Pulse: 89 74  Resp: 19 16  Temp: (!) 97.3 F (36.3 C) 97.6 F (36.4 C)  SpO2: 98% 99%   Physical Exam: Cardiac:  RRR, No rubs, clicks, or gallops.  No murmurs appreciated Lungs: Lungs are coarse on auscultation.  Diminished on the right side versus the left.  Slight respiratory effort with inspiration.  Patient requiring 4 L nasal cannula oxygen this morning. Incisions: Right groin with bandage clean dry and intact.  No hematoma seroma to note Extremities: Bilateral lower extremities warm to touch with palpable pulses. Abdomen: Positive bowel sounds throughout, soft, nontender nondistended. Neurologic: Alert and oriented x 3, answers all questions and follows commands appropriately.  CBC    Component Value Date/Time   WBC 6.3 12/09/2023 0533   RBC 3.67 (L) 12/09/2023 0533   HGB 9.8 (L) 12/09/2023 0533   HGB 8.5 (L) 10/13/2023 1417   HCT 31.3 (L) 12/09/2023 0533   HCT 27.7 (L) 10/13/2023 1417   PLT 200 12/09/2023 0533   PLT 373 10/13/2023 1417   MCV 85.3 12/09/2023 0533   MCV 88 10/13/2023 1417   MCV 89 12/16/2014 0315   MCH 26.7 12/09/2023 0533   MCHC 31.3 12/09/2023 0533   RDW 17.6 (H) 12/09/2023 0533   RDW 13.3 10/13/2023 1417   RDW 13.0 12/16/2014 0315   LYMPHSABS 0.8 12/07/2023 1224   LYMPHSABS 1.7 10/13/2023  1417   LYMPHSABS 3.3 12/16/2014 0315   MONOABS 0.5 12/07/2023 1224   MONOABS 0.7 12/16/2014 0315   EOSABS 0.0 12/07/2023 1224   EOSABS 0.1 10/13/2023 1417   EOSABS 0.1 12/16/2014 0315   BASOSABS 0.0 12/07/2023 1224   BASOSABS 0.0 10/13/2023 1417   BASOSABS 0.0 12/16/2014 0315    BMET    Component Value Date/Time   NA 141 12/09/2023 0533   NA 137 10/13/2023 1417   NA 142 12/16/2014 0315   K 3.7 12/09/2023 0533   K 3.3 (L) 12/17/2014 1700   CL 108 12/09/2023 0533   CL 107 12/16/2014 0315   CO2 25 12/09/2023 0533   CO2 28 12/16/2014 0315   GLUCOSE 132 (H) 12/09/2023 0533   GLUCOSE 121 (H) 12/16/2014 0315   BUN 9 12/09/2023 0533   BUN 12 10/13/2023 1417   BUN 15 12/16/2014 0315   CREATININE 0.59 12/09/2023 0533   CREATININE 0.94 12/16/2014 0315   CALCIUM 8.1 (L) 12/09/2023 0533   CALCIUM 9.1 12/16/2014 0315   GFRNONAA >60 12/09/2023 0533   GFRNONAA >60 12/16/2014 0315   GFRNONAA >60 09/16/2013 1902   GFRAA 82 05/14/2020 0931   GFRAA >60 12/16/2014 0315   GFRAA >60 09/16/2013 1902    INR    Component Value Date/Time   INR 1.3 (H) 12/07/2023 1224   INR 1.0  12/15/2014 1925     Intake/Output Summary (Last 24 hours) at 12/09/2023 3875 Last data filed at 12/09/2023 0321 Gross per 24 hour  Intake --  Output 275 ml  Net -275 ml     Assessment/Plan:  89 y.o. female is s/p pulmonary thrombectomy with IVC filter placement.  1 Day Post-Op   PLAN: Patient with a history of GI bleeds therefore had an IVC filter placed due to the inability to anticoagulate.  Vascular surgery recommends patient be anticoagulated with low-dose Eliquis at 2.5 mg twice a day.  Continue to monitor H&H and if drops consider discontinuing any anticoagulation now that she has filter placed. Okay for patient to ambulate following physical therapy's recommendations. Continue oxygen as needed. PT/OT consults Pain medication as needed Advance diet as tolerated Okay to discharge to home when  medically appropriate on Eliquis 2.5 mg twice a day if able to tolerate.  Vascular surgery signed off this case at this time.  Recommend follow-up with vascular surgery in 1 month postdischarge.  DVT prophylaxis: Heparin infusion converted to oral anticoagulation Eliquis 2.5 mg twice daily   Marcie [facility] 12/09/2023 9:09 AM

## 2023-12-09 NOTE — Evaluation (Signed)
Physical Therapy Evaluation Patient Details Name: [name] MRN: [id number] DOB: [birth year] Today's Date: 12/09/2023  History of Present Illness  Pt is a 89 y.o. female presenting to hospital 12/07/23 with c/o weakness, SOB, fall (pt was walking and had to lower herself to the ground); diagnosed with PE a couple weeks ago.  Pt admitted with PE, DVT B LE's, and GI bleeding.  S/p mechanical thrombectomy and IVC filter placement 12/08/23. PMH includes colon CA (s/p partial colectomy), htn, HLD, DM, diastolic CHF, stroke, B carotid artery stenosis, PSVT, IBS, L CEA, h/o GI bleed, colon CA.  Clinical Impression  Prior to recent medical concerns, pt was ambulatory (used SPC in house; uses RW in community); family lives next to pt and provides support; pt lives alone in 1 level home (level entry); 1 additional recent fall in past 2 weeks.  Currently pt is SBA with bed mobility; min assist with transfers; and CGA to ambulate 10 feet with RW use.  SOB noted with activity (pt's SpO2 sats 96% or greater on 2 L O2 via nasal cannula during sessions activities; HR 85-100 bpm during sessions activities).  Pt would currently benefit from skilled PT to address noted impairments and functional limitations (see below for any additional details).  Upon hospital discharge, pt would benefit from ongoing therapy.     If plan is discharge home, recommend the following: A little help with walking and/or transfers;A little help with bathing/dressing/bathroom;Assistance with cooking/housework;Assist for transportation;Help with stairs or ramp for entrance   Can travel by private vehicle        Equipment Recommendations Other (comment) (Pt has RW and BSC at home already)  Recommendations for Other Services       Functional Status Assessment Patient has had a recent decline in their functional status and demonstrates the ability to make significant improvements in function in a reasonable and predictable amount of time.      Precautions / Restrictions Precautions Precautions: Fall Restrictions Weight Bearing Restrictions Per Provider Order: No      Mobility  Bed Mobility Overal bed mobility: Needs Assistance Bed Mobility: Supine to Sit     Supine to sit: Supervision, HOB elevated     General bed mobility comments: increased effort to perform on own    Transfers Overall transfer level: Needs assistance Equipment used: Rolling walker (2 wheels) Transfers: Sit to/from Stand, Bed to chair/wheelchair/BSC Sit to Stand: Min assist (x1 trial from bed and x1 trial from [facility]) Stand pivot transfers: Min assist (bed to [facility] for toileting)         General transfer comment: assist to steady    Ambulation/Gait Ambulation/Gait assistance: Contact guard assist Gait Distance (Feet): 10 Feet Assistive device: Rolling walker (2 wheels) Gait Pattern/deviations: Step-through pattern, Decreased step length - right, Decreased step length - left Gait velocity: decreased     General Gait Details: SOB with activity  Stairs            Wheelchair Mobility     Tilt Bed    Modified Rankin (Stroke Patients Only)       Balance Overall balance assessment: Needs assistance Sitting-balance support: No upper extremity supported, Feet supported Sitting balance-Leahy Scale: Good Sitting balance - Comments: steady reaching within BOS   Standing balance support: Single extremity supported, During functional activity, Reliant on assistive device for balance Standing balance-Leahy Scale: Poor Standing balance comment: assist to steady at times with dynamic standing activities  Pertinent Vitals/Pain Pain Assessment Pain Assessment: No/denies pain    Home Living Family/patient expects to be discharged to:: Private residence Living Arrangements: Alone Available Help at Discharge: Family;Available PRN/intermittently (Daughter lives on one side and daughter in law  lives on other side of pt) Type of Home: House Home Access: Level entry       Home Layout: One level Home Equipment: Agricultural consultant (2 wheels);Cane - single point;Grab bars - tub/shower      Prior Function Prior Level of Function : Needs assist             Mobility Comments: Pt modified independent with ambulation (SPC in home; uses RW in community).  1 fall about 2 weeks ago (also pt with most recent fall prior to hospitalization). ADLs Comments: Sits in bathtub for bathing.  Assist with ADL's (meals, medications, hygiene, dressing--pt tends to stay in pajamas since she doesn't go out much).     Extremity/Trunk Assessment   Upper Extremity Assessment Upper Extremity Assessment: Generalized weakness    Lower Extremity Assessment Lower Extremity Assessment: Generalized weakness    Cervical / Trunk Assessment Cervical / Trunk Assessment: Normal  Communication   Communication Communication: No apparent difficulties Cueing Techniques: Verbal cues  Cognition Arousal: Alert Behavior During Therapy: WFL for tasks assessed/performed Overall Cognitive Status: Within Functional Limits for tasks assessed                                          General Comments  Nursing cleared pt for participation in physical therapy.  Pt agreeable to PT session.  Pt's daughter in law present during session and appearing very supportive.    Exercises     Assessment/Plan    PT Assessment Patient needs continued PT services  PT Problem List Decreased strength;Decreased activity tolerance;Decreased balance;Decreased mobility       PT Treatment Interventions DME instruction;Gait training;Functional mobility training;Therapeutic activities;Therapeutic exercise;Balance training;Patient/family education    PT Goals (Current goals can be found in the Care Plan section)  Acute Rehab PT Goals Patient Stated Goal: to improve strength and walking PT Goal Formulation: With  patient/family Time For Goal Achievement: 12/23/23 Potential to Achieve Goals: Good    Frequency Min 1X/week     Co-evaluation               AM-PAC PT "6 Clicks" Mobility  Outcome Measure Help needed turning from your back to your side while in a flat bed without using bedrails?: None Help needed moving from lying on your back to sitting on the side of a flat bed without using bedrails?: A Little Help needed moving to and from a bed to a chair (including a wheelchair)?: A Little Help needed standing up from a chair using your arms (e.g., wheelchair or bedside chair)?: A Little Help needed to walk in hospital room?: A Little Help needed climbing 3-5 steps with a railing? : A Little 6 Click Score: 19    End of Session Equipment Utilized During Treatment: Gait belt Activity Tolerance: Patient limited by fatigue Patient left: in chair;with call bell/phone within reach;with chair alarm set;with family/visitor present Nurse Communication: Mobility status;Precautions;Other (comment) (Pt's vitals during session and pt with BM) PT Visit Diagnosis: Unsteadiness on feet (R26.81);Other abnormalities of gait and mobility (R26.89);Muscle weakness (generalized) (M62.81);History of falling (Z91.81)    Time: 2202-5427 PT Time Calculation (min) (ACUTE ONLY): 29 min   Charges:  PT Evaluation $PT Eval Low Complexity: 1 Low PT Treatments $Therapeutic Activity: 8-22 mins PT General Charges $$ ACUTE PT VISIT: 1 Visit        Hendricks Limes, PT 12/09/23, 2:44 PM

## 2023-12-09 NOTE — Progress Notes (Signed)
*  PRELIMINARY RESULTS* Echocardiogram 2D Echocardiogram has been performed.  [name] 12/09/2023, 1:48 PM

## 2023-12-09 NOTE — Evaluation (Signed)
Occupational Therapy Evaluation Patient Details Name: [name] MRN: [id number] DOB: [birth year] Today's Date: 12/09/2023   History of Present Illness Pt is a 89 y.o. female presenting to hospital 12/07/23 with c/o weakness, SOB, fall (pt was walking and had to lower herself to the ground); diagnosed with PE a couple weeks ago.  Pt admitted with PE, DVT B LE's, and GI bleeding.  S/p mechanical thrombectomy and IVC filter placement 12/08/23. PMH includes colon CA (s/p partial colectomy), htn, HLD, DM, diastolic CHF, stroke, B carotid artery stenosis, PSVT, IBS, L CEA, h/o GI bleed, colon CA.   Clinical Impression   Prior to hospital admission, pt was completing functional mobility with SPC household distances, RW for community and family assist with ADLs as needed. Pt lives alone with family next door. Pt currently requires minA for transfers and short bout of mobility with RW ~10 ft to perform oral care standing sink level. On 2L O2, SpO2% 95>. Will require minA for ADL performance this date.  Pt would benefit from skilled OT services to address noted impairments and functional limitations (see below for any additional details) in order to maximize safety and independence while minimizing falls risk and caregiver burden. Patient will benefit from continued inpatient follow up therapy, <3 hours/day       If plan is discharge home, recommend the following: A little help with walking and/or transfers;A little help with bathing/dressing/bathroom;Assistance with cooking/housework;Direct supervision/assist for medications management;Direct supervision/assist for financial management;Assist for transportation;Help with stairs or ramp for entrance    Functional Status Assessment  Patient has had a recent decline in their functional status and demonstrates the ability to make significant improvements in function in a reasonable and predictable amount of time.  Equipment Recommendations  None recommended  by OT       Precautions / Restrictions Precautions Precautions: Fall Restrictions Weight Bearing Restrictions Per Provider Order: No      Mobility Bed Mobility Overal bed mobility: Needs Assistance             General bed mobility comments: Pt recieved in recliner    Transfers Overall transfer level: Needs assistance Equipment used: Rolling walker (2 wheels) Transfers: Sit to/from Stand, Bed to chair/wheelchair/BSC Sit to Stand: Min assist Stand pivot transfers: Min assist   Step pivot transfers: Min assist            Balance Overall balance assessment: Needs assistance Sitting-balance support: No upper extremity supported, Feet supported Sitting balance-Leahy Scale: Good     Standing balance support: Single extremity supported, During functional activity, Reliant on assistive device for balance Standing balance-Leahy Scale: Poor Standing balance comment: assist to steady at sink                           ADL either performed or assessed with clinical judgement   ADL Overall ADL's : Needs assistance/impaired Eating/Feeding: Modified independent;Sitting   Grooming: Set up;Sitting;Oral care;Wash/dry face;Wash/dry hands   Upper Body Bathing: Minimal assistance;Sitting   Lower Body Bathing: Minimal assistance;Sit to/from stand   Upper Body Dressing : Minimal assistance;Sitting   Lower Body Dressing: Minimal assistance;Sit to/from stand   Toilet Transfer: Rolling walker (2 wheels);BSC/3in1;Minimal assistance;Stand-pivot   Toileting- Clothing Manipulation and Hygiene: Minimal assistance;Sit to/from stand       Functional mobility during ADLs: Minimal assistance;Rolling walker (2 wheels) General ADL Comments: Pt presents with generalized weakness and decreased tolerance to activity with impaired balance. Grooming tasks standing at sink with  intermittent forearm support. ~10 ft functional mobility with RW, minA. MinA for sit to stand transfers  using RW.      Pertinent Vitals/Pain Pain Assessment Pain Assessment: No/denies pain     Extremity/Trunk Assessment Upper Extremity Assessment Upper Extremity Assessment: Generalized weakness   Lower Extremity Assessment Lower Extremity Assessment: Generalized weakness   Cervical / Trunk Assessment Cervical / Trunk Assessment: Normal   Communication Communication Communication: No apparent difficulties Cueing Techniques: Verbal cues   Cognition Arousal: Alert Behavior During Therapy: WFL for tasks assessed/performed Overall Cognitive Status: Within Functional Limits for tasks assessed                                       General Comments  SpO2 95%> throughout on 2L            Home Living Family/patient expects to be discharged to:: Private residence Living Arrangements: Alone Available Help at Discharge: Family;Available PRN/intermittently (Daughter lives on one side and daughter in law lives on other side of pt) Type of Home: House Home Access: Level entry     Home Layout: One level     Bathroom Shower/Tub: Chief Strategy Officer: Standard     Home Equipment: Agricultural consultant (2 wheels);Cane - single point;Grab bars - tub/shower          Prior Functioning/Environment Prior Level of Function : Needs assist             Mobility Comments: Pt modified independent with ambulation (SPC in home; uses RW in community).  1 fall about 2 weeks ago (also pt with most recent fall prior to hospitalization). ADLs Comments: Sits in bathtub for bathing.  Assist with ADL's (meals, medications, hygiene, dressing--pt tends to stay in pajamas since she doesn't go out much).        OT Problem List: Cardiopulmonary status limiting activity;Impaired balance (sitting and/or standing);Decreased activity tolerance;Decreased strength      OT Treatment/Interventions: Self-care/ADL training;Therapeutic exercise;Energy conservation;DME and/or AE  instruction;Therapeutic activities;Patient/family education;Balance training    OT Goals(Current goals can be found in the care plan section) Acute Rehab OT Goals OT Goal Formulation: With patient/family Time For Goal Achievement: 12/23/23 Potential to Achieve Goals: Fair  OT Frequency: Min 1X/week       AM-PAC OT "6 Clicks" Daily Activity     Outcome Measure Help from another person eating meals?: None Help from another person taking care of personal grooming?: A Little Help from another person toileting, which includes using toliet, bedpan, or urinal?: A Little Help from another person bathing (including washing, rinsing, drying)?: A Little Help from another person to put on and taking off regular upper body clothing?: A Little Help from another person to put on and taking off regular lower body clothing?: A Little 6 Click Score: 19   End of Session Equipment Utilized During Treatment: Gait belt;Rolling walker (2 wheels) Nurse Communication: Mobility status  Activity Tolerance: Patient tolerated treatment well Patient left: in chair;with call bell/phone within reach;with chair alarm set;with family/visitor present (TED hose on)  OT Visit Diagnosis: Unsteadiness on feet (R26.81);History of falling (Z91.81);Muscle weakness (generalized) (M62.81)                Time: 4098-1191 OT Time Calculation (min): 33 min Charges:  OT General Charges $OT Visit: 1 Visit OT Evaluation $OT Eval Low Complexity: 1 Low OT Treatments $Self Care/Home Management : 8-22 mins  Dariela Stoker L. Hiba Garry, OTR/L  12/09/23, 4:01 PM

## 2023-12-09 NOTE — Progress Notes (Signed)
PROGRESS NOTE    [name]  YQM:[id number] DOB: 1929/07/09 DOA: 12/07/2023 PCP: Sallee Provencal, FNP  Assessment & Plan:   Principal Problem:   Pulmonary embolism (HCC) Active Problems:   GI bleeding   Hypertension associated with diabetes (HCC)   History of CVA (cerebrovascular accident)   Hyperlipidemia   Colon cancer (HCC)   FTT (failure to thrive) in adult   Diabetes mellitus without complication (HCC)   Depression, recurrent (HCC)   Essential hypertension   DVT (deep venous thrombosis) (HCC)  Assessment and Plan:  Pulmonary embolism: increase thrombus burden in the right pulmonary artery compared to prior CT imaging on 11/23/2023, representing interval increased thromboembolic burden. S/p thrombectomy and IVC filter placement. IV heparin was d/c after procedure as pt has hx of GI bleed and started on low dose eliquis as per vasc surg recs. Will monitor for signs/symptoms of bleeding    GI bleeding: H&H are trending down. No need for a transfusion currently. Will continue to monitor   CAP: continue on IV rocephin, azithromycin, bronchodilator prn & encourage incentive spirometry   DM2: well controlled, HbA1c 7.1. Continue on SSI w/ accuchecks     HLD: continue on statin    Colon cancer: management per onco outpatient    BMW:[id number] thrombus in the mid to distal right popliteal vein.  Thrombus in the right peroneal vein.  Thrombus in the left posterior tibial and peroneal veins. S/p IVC filter placed    DVT prophylaxis: eliquis  Code Status: full Family Communication: discussed pt's care w/ pt's family at bedside and answered their questions  Disposition Plan: PT/OT recs SNF   Level of care: Progressive  Status is: Inpatient Remains inpatient appropriate because: severity of illness   Consultants:  Vasc surg   Procedures:   Antimicrobials: azithromycin, rocephin   Subjective: Pt c/o malaise   Objective: Vitals:   12/08/23 1955 12/08/23 2343  12/09/23 0406 12/09/23 0900  BP: 132/67 128/69 (!) 105/51 (!) 111/53  Pulse: 83 89 74 85  Resp: 17 19 16 17   Temp: 98.1 F (36.7 C) (!) 97.3 F (36.3 C) 97.6 F (36.4 C) 98.1 F (36.7 C)  TempSrc: Oral Oral Oral Oral  SpO2: 99% 98% 99% 98%  Weight:      Height:        Intake/Output Summary (Last 24 hours) at 12/09/2023 1153 Last data filed at 12/09/2023 0321 Gross per 24 hour  Intake --  Output 200 ml  Net -200 ml   Filed Weights   12/07/23 1220  Weight: 53 kg    Examination:  General exam: Appears comfortable Respiratory system: diminished breath sounds  Cardiovascular system: S1/S2+. No rubs or clicks  Gastrointestinal system: abd is soft, NT, ND & hypoactive bowel sounds Central nervous system: alert & oriented. Moves all extremities  Psychiatry: Judgement and insight appears at baseline. Appropriate mood and affect    Data Reviewed: I have personally reviewed following labs and imaging studies  CBC: Recent Labs  Lab 12/07/23 1224 12/08/23 0356 12/09/23 0533  WBC 10.3 7.4 6.3  NEUTROABS 8.8*  --   --   HGB 13.2 10.3* 9.8*  HCT 42.3 33.1* 31.3*  MCV 84.6 87.6 85.3  PLT 222 209 200   Basic Metabolic Panel: Recent Labs  Lab 12/07/23 1225 12/08/23 0356 12/09/23 0533  NA 136 140 141  K 4.6 3.5 3.7  CL 100 107 108  CO2 23 25 25   GLUCOSE 226* 103* 132*  BUN 11 9 9  CREATININE 0.81 0.59 0.59  CALCIUM 8.8* 8.3* 8.1*   GFR: Estimated Creatinine Clearance: 35.5 mL/min (by C-G formula based on SCr of 0.59 mg/dL). Liver Function Tests: Recent Labs  Lab 12/07/23 1225  AST 37  ALT 22  ALKPHOS 121  BILITOT 1.6*  PROT 6.5  ALBUMIN 2.6*   No results for input(s): "LIPASE", "AMYLASE" in the last 168 hours. No results for input(s): "AMMONIA" in the last 168 hours. Coagulation Profile: Recent Labs  Lab 12/07/23 1224  INR 1.3*   Cardiac Enzymes: No results for input(s): "CKTOTAL", "CKMB", "CKMBINDEX", "TROPONINI" in the last 168 hours. BNP (last 3  results) No results for input(s): "PROBNP" in the last 8760 hours. HbA1C: No results for input(s): "HGBA1C" in the last 72 hours. CBG: Recent Labs  Lab 12/08/23 0804 12/08/23 1305 12/08/23 1451 12/08/23 2117 12/09/23 0815  GLUCAP 108* 162* 131* 144* 117*   Lipid Profile: No results for input(s): "CHOL", "HDL", "LDLCALC", "TRIG", "CHOLHDL", "LDLDIRECT" in the last 72 hours. Thyroid Function Tests: No results for input(s): "TSH", "T4TOTAL", "FREET4", "T3FREE", "THYROIDAB" in the last 72 hours. Anemia Panel: No results for input(s): "VITAMINB12", "FOLATE", "FERRITIN", "TIBC", "IRON", "RETICCTPCT" in the last 72 hours. Sepsis Labs: Recent Labs  Lab 12/07/23 1220 12/07/23 1431  LATICACIDVEN 2.9* 1.7    Recent Results (from the past 240 hours)  Culture, blood (Routine x 2)     Status: None (Preliminary result)   Collection Time: 12/07/23 12:24 PM   Specimen: BLOOD  Result Value Ref Range Status   Specimen Description BLOOD RIGHT ANTECUBITAL  Final   Special Requests   Final    BOTTLES DRAWN AEROBIC AND ANAEROBIC Blood Culture adequate volume   Culture   Final    NO GROWTH 2 DAYS Performed at [facility], [address], [place], [place] [address]    Report Status PENDING  Incomplete  MRSA Next Gen by PCR, Nasal     Status: None   Collection Time: 12/07/23  5:45 PM   Specimen: Nasal Mucosa; Nasal Swab  Result Value Ref Range Status   MRSA by PCR Next Gen NOT DETECTED NOT DETECTED Final    Comment: (NOTE) The GeneXpert MRSA Assay (FDA approved for NASAL specimens only), is one component of a comprehensive MRSA colonization surveillance program. It is not intended to diagnose MRSA infection nor to guide or monitor treatment for MRSA infections. Test performance is not FDA approved in patients less than 26 years old. Performed at [facility], [address] [place], [place] [address]   Urine Culture     Status: Abnormal (Preliminary result)    Collection Time: 12/08/23  7:21 AM   Specimen: Urine, Random  Result Value Ref Range Status   Specimen Description   Final    URINE, RANDOM Performed at [facility], [address] [place], [place] [address]    Special Requests URINE, CLEAN CATCH  Final   Culture (A)  Final    30,000 COLONIES/mL ENTEROCOCCUS FAECALIS SUSCEPTIBILITIES TO FOLLOW Performed at [facility], [address], [place], [place] [address]    Report Status PENDING  Incomplete         Radiology Studies: PERIPHERAL VASCULAR CATHETERIZATION Result Date: 12/08/2023 See surgical note for result.  US Venous Img Lower Bilateral (DVT) Result Date: 12/07/2023 CLINICAL DATA:  Pulmonary embolism. EXAM: BILATERAL LOWER EXTREMITY VENOUS DOPPLER ULTRASOUND TECHNIQUE: Gray-scale sonography with graded compression, as well as color Doppler and duplex ultrasound were performed to evaluate the lower  extremity deep venous systems from the level of the common femoral vein and including the common femoral, femoral, profunda femoral, popliteal and calf veins including the posterior tibial, peroneal and gastrocnemius veins when visible. The superficial great saphenous vein was also interrogated. Spectral Doppler was utilized to evaluate flow at rest and with distal augmentation maneuvers in the common femoral, femoral and popliteal veins. COMPARISON:  None Available. FINDINGS: RIGHT LOWER EXTREMITY Common Femoral Vein: No evidence of thrombus. Normal compressibility, respiratory phasicity and response to augmentation. Saphenofemoral Junction: No evidence of thrombus. Normal compressibility and flow on color Doppler imaging. Profunda Femoral Vein: No evidence of thrombus. Normal compressibility and flow on color Doppler imaging. Femoral Vein: No evidence of thrombus. Normal compressibility, respiratory phasicity and response to augmentation. Popliteal Vein: Some flow is present in the proximal popliteal vein. There is  occlusive thrombus in the mid to distal segment. Calf Veins: Thrombus in the right peroneal vein. The right posterior tibial vein is patent. Superficial Great Saphenous Vein: No evidence of thrombus. Normal compressibility. Venous Reflux:  None. Other Findings: No evidence of superficial thrombophlebitis or abnormal fluid collection. LEFT LOWER EXTREMITY Common Femoral Vein: No evidence of thrombus. Normal compressibility, respiratory phasicity and response to augmentation. Saphenofemoral Junction: No evidence of thrombus. Normal compressibility and flow on color Doppler imaging. Profunda Femoral Vein: No evidence of thrombus. Normal compressibility and flow on color Doppler imaging. Femoral Vein: No evidence of thrombus. Normal compressibility, respiratory phasicity and response to augmentation. Popliteal Vein: No evidence of thrombus. Normal compressibility, respiratory phasicity and response to augmentation. Calf Veins: Thrombus in left posterior tibial and peroneal veins. Superficial Great Saphenous Vein: No evidence of thrombus. Normal compressibility. Venous Reflux:  None. Other Findings: No evidence of superficial thrombophlebitis or abnormal fluid collection. IMPRESSION: 1. Occlusive thrombus in the mid to distal right popliteal vein. Thrombus in the right peroneal vein. 2. Thrombus in the left posterior tibial and peroneal veins. Electronically Signed   By: Irish Lack M.D.   On: 12/07/2023 17:08   CT Angio Chest PE W and/or Wo Contrast Result Date: 12/07/2023 CLINICAL DATA:  Syncopal episode, hypoxia and known recent right-sided pulmonary embolism by CT. History of lung carcinoma and colon carcinoma. Prior radiation therapy to the left lower lobe in July. EXAM: CT ANGIOGRAPHY CHEST WITH CONTRAST TECHNIQUE: Multidetector CT imaging of the chest was performed using the standard protocol during bolus administration of intravenous contrast. Multiplanar CT image reconstructions and MIPs were obtained to  evaluate the vascular anatomy. RADIATION DOSE REDUCTION: This exam was performed according to the departmental dose-optimization program which includes automated exposure control, adjustment of the mA and/or kV according to patient size and/or use of iterative reconstruction technique. CONTRAST:  75mL OMNIPAQUE IOHEXOL 350 MG/ML SOLN COMPARISON:  CTA of the chest on 11/23/2023 FINDINGS: Cardiovascular: Pulmonary embolism again noted on the right. Thrombus burden is greater than on the prior standard CT likely representing some interval increased thromboembolism since the prior study with increased thrombus at the bifurcation of the right main pulmonary artery with nonocclusive thrombus extending into upper, middle lobe and lower lobe pulmonary arteries. Most significant thrombus extends into the lower lobe pulmonary artery. No thrombus is identified on the left. Dilated central pulmonary arteries again present with the main pulmonary artery measuring 3.2 cm. There is likely some degree of right heart strain with estimated RV/LV ratio of roughly 1.45. Stable aneurysmal disease of the ascending thoracic aorta measuring up to approximately 4.1 cm with associated calcified aortic valve and atherosclerosis of  the thoracic aorta. The heart size is normal. Heavily calcified mitral valve annulus again noted. No pericardial fluid. Calcified coronary artery plaque present. Mediastinum/Nodes: No enlarged mediastinal, hilar, or axillary lymph nodes. Thyroid gland, trachea, and esophagus demonstrate no significant findings. Lungs/Pleura: Stable consolidative opacities in the posterior left upper lobe, inferior lingula and posterior left lower lobe. Some of these are again likely related to prior radiation therapy. Some degree pneumonia in the left lung cannot be excluded. There is a is stable small amount of left pleural fluid. Small slightly more prominent peripheral opacities in the lateral left upper lobe and posterior left  lower lobe may relate to focal pulmonary infarcts and/or atelectasis. No pneumothorax. Upper Abdomen: No acute abnormality. Musculoskeletal: No chest wall abnormality. No acute or significant osseous findings. Review of the MIP images confirms the above findings. IMPRESSION: 1. Increased thrombus burden in the right pulmonary arteries since the prior standard CT of 11/23/2023, likely representing interval increased thromboembolic burden. There is likely some degree of right heart strain with estimated RV/LV ratio of roughly 1.45. Consider correlation with echocardiography. 2. Stable consolidative opacities in the posterior left upper lobe, inferior lingula and posterior left lower lobe. Some of these are again likely related to prior radiation therapy. Some degree pneumonia in the left lung cannot be excluded. 3. Stable small amount of left pleural fluid. 4. Small slightly more prominent peripheral opacities in the lateral left upper lobe and posterior left lower lobe may relate to focal pulmonary infarcts and/or atelectasis. 5. 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 6. Aortic atherosclerosis. Aortic aneurysm NOS (ICD10-I71.9). Electronically Signed   By: Irish Lack M.D.   On: 12/07/2023 14:59   DG Chest 2 View Result Date: 12/07/2023 CLINICAL DATA:  Suspected sepsis. EXAM: CHEST - 2 VIEW COMPARISON:  CT chest dated November 23, 2023. FINDINGS: The mediastinum remains shifted to the left with similar volume loss and patchy airspace opacification throughout much of the left lung, presumably radiation related. There are a few irregular opacities in the central and peripheral right upper lobe. Unchanged small left pleural effusion. No pneumothorax. No acute osseous abnormality. IMPRESSION: 1. Irregular opacities in the central and peripheral right upper lobe, concerning for pneumonia. 2.  Similar volume loss and post radiation change in the left lung. Electronically Signed   By: Obie Dredge M.D.   On: 12/07/2023 14:28        Scheduled Meds:  apixaban  2.5 mg Oral BID   atorvastatin  40 mg Oral QHS   cholecalciferol  1,000 Units Oral Daily   feeding supplement  237 mL Oral BID BM   insulin aspart  0-15 Units Subcutaneous TID WC   insulin aspart  0-5 Units Subcutaneous QHS   pantoprazole  40 mg Oral Daily   PARoxetine  10 mg Oral Daily   temazepam  15 mg Oral QHS   Continuous Infusions:  azithromycin 500 mg (12/08/23 1615)   cefTRIAXone (ROCEPHIN)  IV 2 g (12/08/23 2138)     LOS: 2 days       Charise Killian, MD [facility] Pager [phone]  If 7PM-7AM, please contact night-coverage  12/09/2023, 11:53 AM

## 2023-12-09 NOTE — Telephone Encounter (Signed)
Patient Product/process development scientist completed.    The patient is insured through [facility]. Patient has Medicare and is not eligible for a copay card, but may be able to apply for patient assistance or Medicare RX Payment Plan (Patient Must reach out to their plan, if eligible for payment plan), if available.    Ran test claim for Eliquis 5 mg and the current 30 day co-pay is $420.00 due to a $375.00 deductible.  Will be $45.00 once deductible is met.   This test claim was processed through [facility]- copay amounts may vary at other pharmacies due to pharmacy/plan contracts, or as the patient moves through the different stages of their insurance plan.     Roland Earl, CPHT Pharmacy Technician III Certified Patient Advocate [facility] Patient Advocate Team Direct Number: [phone]  Fax: [phone]

## 2023-12-10 DIAGNOSIS — Z8673 Personal history of transient ischemic attack (TIA), and cerebral infarction without residual deficits: Secondary | ICD-10-CM

## 2023-12-10 DIAGNOSIS — I2609 Other pulmonary embolism with acute cor pulmonale: Secondary | ICD-10-CM | POA: Diagnosis not present

## 2023-12-10 DIAGNOSIS — Z515 Encounter for palliative care: Secondary | ICD-10-CM | POA: Diagnosis not present

## 2023-12-10 LAB — CBC
HCT: 31.8 % — ABNORMAL LOW (ref 36.0–46.0)
Hemoglobin: 9.7 g/dL — ABNORMAL LOW (ref 12.0–15.0)
MCH: 27.1 pg (ref 26.0–34.0)
MCHC: 30.5 g/dL (ref 30.0–36.0)
MCV: 88.8 fL (ref 80.0–100.0)
Platelets: 197 10*3/uL (ref 150–400)
RBC: 3.58 MIL/uL — ABNORMAL LOW (ref 3.87–5.11)
RDW: 17.6 % — ABNORMAL HIGH (ref 11.5–15.5)
WBC: 6 10*3/uL (ref 4.0–10.5)
nRBC: 0 % (ref 0.0–0.2)

## 2023-12-10 LAB — BASIC METABOLIC PANEL
Anion gap: 8 (ref 5–15)
BUN: 11 mg/dL (ref 8–23)
CO2: 24 mmol/L (ref 22–32)
Calcium: 8.4 mg/dL — ABNORMAL LOW (ref 8.9–10.3)
Chloride: 107 mmol/L (ref 98–111)
Creatinine, Ser: 0.64 mg/dL (ref 0.44–1.00)
GFR, Estimated: >60 mL/min (ref >60)
Glucose, Bld: 129 mg/dL — ABNORMAL HIGH (ref 70–99)
Potassium: 3.6 mmol/L (ref 3.5–5.1)
Sodium: 139 mmol/L (ref 135–145)

## 2023-12-10 LAB — URINE CULTURE: Culture: 30000 — AB

## 2023-12-10 LAB — GLUCOSE, CAPILLARY
Glucose-Capillary: 137 mg/dL — ABNORMAL HIGH (ref 70–99)
Glucose-Capillary: 290 mg/dL — ABNORMAL HIGH (ref 70–99)
Glucose-Capillary: 131 mg/dL — ABNORMAL HIGH (ref 70–99)
Glucose-Capillary: 114 mg/dL — ABNORMAL HIGH (ref 70–99)

## 2023-12-10 MED ORDER — AZITHROMYCIN 250 MG PO TABS
500.0000 mg | ORAL_TABLET | Freq: Every day | ORAL | Status: AC
  Administered 2023-12-10 – 2023-12-11 (×2): 500 mg via ORAL
  Filled 2023-12-10 (×2): qty 2

## 2023-12-10 NOTE — TOC Initial Note (Signed)
Transition of Care [facility]) - Initial/Assessment Note    Patient Details  Name: [name] MRN: [id number] Date of Birth: 29-May-1929  Transition of Care [facility]) CM/SW Contact:    Truddie Hidden, RN Phone Number: 12/10/2023, 4:25 PM  Clinical Narrative:                 Admitted for: syncope related to pulmonary embolism  Admitted from:home  Pharmacy:[facility] [address] Current home health/prior home health/DME: [facility], walker, cane Transporation: daughter HH: [facility]  Spoke with patient and her daughter-in-law, [name] regarding [facility] therapy. They would prefer [facility].   Referral for [facility] and accepted by [place] at  [facility].     Expected Discharge Plan: Home w Home Health Services Barriers to Discharge: Barriers Resolved   Patient Goals and CMS Choice Patient states their goals for this hospitalization and ongoing recovery are:: home CMS Medicare.gov Compare Post Acute Care list provided to:: Patient Represenative (must comment)        Expected Discharge Plan and Services     Post Acute Care Choice: Home Health Living arrangements for the past 2 months: Single Family Home                           HH Arranged: PT, OT HH Agency: [facility] Date [facility] Agency Contacted: 12/10/23 Time HH Agency Contacted: 1624 Representative spoke with at [facility] Agency: [place]  Prior Living Arrangements/Services Living arrangements for the past 2 months: Single Family Home Lives with:: Self Patient language and need for interpreter reviewed:: Yes Do you feel safe going back to the place where you live?: Yes      Need for Family Participation in Patient Care: Yes (Comment) Care giver support system in place?: Yes (comment)   Criminal Activity/Legal Involvement Pertinent to Current Situation/Hospitalization: No - Comment as needed  Activities of Daily Living   ADL Screening (condition at time of admission) Independently performs ADLs?: Yes (appropriate for developmental  age) Is the patient deaf or have difficulty hearing?: No Does the patient have difficulty seeing, even when wearing glasses/contacts?: No Does the patient have difficulty concentrating, remembering, or making decisions?: No  Permission Sought/Granted                  Emotional Assessment Appearance:: Appears older than stated age, Silvestre Gunner stated age Attitude/Demeanor/Rapport: Gracious, Engaged Affect (typically observed): Accepting Orientation: : Oriented to Self, Oriented to Place, Oriented to  Time, Oriented to Situation Alcohol / Substance Use: Not Applicable Psych Involvement: No (comment)  Admission diagnosis:  Pulmonary embolism (HCC) [I26.99] Elevated troponin [R79.89] Acute pulmonary embolism, unspecified pulmonary embolism type, unspecified whether acute cor pulmonale present (HCC) [I26.99] Patient Active Problem List   Diagnosis Date Noted   Pulmonary embolism (HCC) 12/07/2023   DVT (deep venous thrombosis) (HCC) 12/07/2023   Acute pulmonary embolism (HCC) 12/04/2023   Essential hypertension 12/04/2023   Frail elderly 11/25/2023   Diabetes mellitus without complication (HCC) 11/25/2023   Depression, recurrent (HCC) 11/25/2023   Iron deficiency anemia 10/19/2023   FTT (failure to thrive) in adult 10/13/2023   Iron deficiency anemia due to chronic blood loss 10/13/2023   B12 deficiency 10/13/2023   Diverticulosis of colon with hemorrhage 10/07/2023   GI bleeding 10/02/2023   Lung nodule 10/02/2023   Dizziness 07/21/2023   Stroke-like symptom 07/20/2023   Encounter for subsequent annual wellness visit (AWV) in Medicare patient 07/13/2023   Hypertension associated with diabetes (HCC) 07/13/2023  Hyperlipidemia 07/13/2023   Type 2 diabetes mellitus with hyperglycemia, without long-term current use of insulin (HCC) 07/13/2023   Primary insomnia 07/13/2023   Colon cancer (HCC) 06/21/2023   Melena 02/15/2023   Acute blood loss anemia 02/15/2023   Chronic diastolic  CHF (congestive heart failure) (HCC) 02/15/2023   Annual physical exam 07/06/2022   Aortic valve stenosis 08/05/2015   History of CVA (cerebrovascular accident) 08/05/2015   OP (osteoporosis) 08/05/2015   Superficial thrombophlebitis 08/05/2015   Generalized weakness 08/05/2015   PCP:  Sallee Provencal, FNP Pharmacy:   [facility] [address], [place] - [address] [place] [address] Phone: [phone] Fax: [phone]     Social Drivers of Health (SDOH) Social History: SDOH Screenings   Food Insecurity: No Food Insecurity (12/08/2023)  Housing: Low Risk  (12/08/2023)  Transportation Needs: No Transportation Needs (12/08/2023)  Utilities: Not At Risk (12/08/2023)  Alcohol Screen: Low Risk  (05/09/2020)  Depression (PHQ2-9): High Risk (11/25/2023)  Financial Resource Strain: Low Risk  (05/09/2020)  Physical Activity: Inactive (05/09/2020)  Social Connections: Moderately Isolated (12/08/2023)  Stress: No Stress Concern Present (05/09/2020)  Tobacco Use: Medium Risk (12/07/2023)   SDOH Interventions:     Readmission Risk Interventions    12/10/2023    4:22 PM  Readmission Risk Prevention Plan  Transportation Screening Complete  PCP or Specialist Appt within 3-5 Days Complete  HRI or Home Care Consult Complete  Palliative Care Screening Not Applicable  Medication Review (RN Care Manager) Complete

## 2023-12-10 NOTE — Progress Notes (Signed)
Physical Therapy Treatment Patient Details Name: [name] MRN: [id number] DOB: 1929-08-28 Today's Date: 12/10/2023   History of Present Illness Pt is a 89 y.o. female presenting to hospital 12/07/23 with c/o weakness, SOB, fall (pt was walking and had to lower herself to the ground); diagnosed with PE a couple weeks ago.  Pt admitted with PE, DVT B LE's, and GI bleeding.  S/p mechanical thrombectomy and IVC filter placement 12/08/23. PMH includes colon CA (s/p partial colectomy), htn, HLD, DM, diastolic CHF, stroke, B carotid artery stenosis, PSVT, IBS, L CEA, h/o GI bleed, colon CA.    PT Comments  Pt resting in recliner upon PT arrival; pt's daughter and daughter in law present; pt agreeable to therapy.  Nurse cleared PT to trial pt on room air.  During session pt CGA with transfers and CGA progressing to SBA with ambulation 120 feet with youth sized RW use.  Mild SOB noted towards end of ambulation.  Pt's SpO2 sats 90-91% on room air at rest beginning of session; SpO2 sats 91-92% with ambulation on room air; and SpO2 sats 92% on room air at rest end of session (pt left on room air end of session--nurse notified and agreeable).  Pt's family appearing comfortable with pt going home when medically appropriate; pt's family plan to provide initial 24/7 assist.     If plan is discharge home, recommend the following: A little help with walking and/or transfers;A little help with bathing/dressing/bathroom;Assistance with cooking/housework;Assist for transportation;Help with stairs or ramp for entrance   Can travel by private vehicle      Yes  Equipment Recommendations  Other (comment) (Pt has RW and BSC at home already)    Recommendations for Other Services       Precautions / Restrictions Precautions Precautions: Fall Restrictions Weight Bearing Restrictions Per Provider Order: No     Mobility  Bed Mobility               General bed mobility comments: Deferred (pt in recliner  beginning/end of session)    Transfers Overall transfer level: Needs assistance Equipment used: Rolling walker (2 wheels) (youth sized) Transfers: Sit to/from Stand Sit to Stand: Contact guard assist           General transfer comment: x2 trials from recliner; vc's for UE placement    Ambulation/Gait Ambulation/Gait assistance: Contact guard assist, Supervision Gait Distance (Feet): 120 Feet Assistive device: Rolling walker (2 wheels) (youth sized) Gait Pattern/deviations: Step-through pattern, Decreased step length - right, Decreased step length - left Gait velocity: decreased     General Gait Details: steady ambulating with walker use; mild SOB with increased distance ambulating   Stairs             Wheelchair Mobility     Tilt Bed    Modified Rankin (Stroke Patients Only)       Balance Overall balance assessment: Needs assistance Sitting-balance support: No upper extremity supported, Feet supported Sitting balance-Leahy Scale: Good Sitting balance - Comments: steady reaching within BOS   Standing balance support: During functional activity, Reliant on assistive device for balance, Bilateral upper extremity supported Standing balance-Leahy Scale: Good Standing balance comment: steady ambulating with walker use                            Cognition Arousal: Alert Behavior During Therapy: WFL for tasks assessed/performed Overall Cognitive Status: Within Functional Limits for tasks assessed  Exercises      General Comments  Nursing cleared pt for participation in physical therapy.  Pt agreeable to PT session.      Pertinent Vitals/Pain Pain Assessment Pain Assessment: No/denies pain HR 81-103 bpm during sessions activities.    Home Living                          Prior Function            PT Goals (current goals can now be found in the care plan section) Acute  Rehab PT Goals Patient Stated Goal: to improve strength and walking PT Goal Formulation: With patient/family Time For Goal Achievement: 12/23/23 Potential to Achieve Goals: Good Progress towards PT goals: Progressing toward goals    Frequency    Min 1X/week      PT Plan      Co-evaluation              AM-PAC PT "6 Clicks" Mobility   Outcome Measure  Help needed turning from your back to your side while in a flat bed without using bedrails?: None Help needed moving from lying on your back to sitting on the side of a flat bed without using bedrails?: None Help needed moving to and from a bed to a chair (including a wheelchair)?: A Little Help needed standing up from a chair using your arms (e.g., wheelchair or bedside chair)?: A Little Help needed to walk in hospital room?: A Little Help needed climbing 3-5 steps with a railing? : A Little 6 Click Score: 20    End of Session Equipment Utilized During Treatment: Gait belt Activity Tolerance: Patient tolerated treatment well Patient left: in chair;with call bell/phone within reach;with family/visitor present;Other (comment) (Pt's family declined chair alarm--pt's family reports they will notify nursing when they leave so alarm can be placed when no family is present.) Nurse Communication: Mobility status;Precautions;Other (comment) (Pt's vitals/SpO2 sats during session; pt left on room air at 92%) PT Visit Diagnosis: Unsteadiness on feet (R26.81);Other abnormalities of gait and mobility (R26.89);Muscle weakness (generalized) (M62.81);History of falling (Z91.81)     Time: 2956-2130 PT Time Calculation (min) (ACUTE ONLY): 25 min  Charges:    $Gait Training: 8-22 mins $Therapeutic Activity: 8-22 mins PT General Charges $$ ACUTE PT VISIT: 1 Visit                     Hendricks Limes, PT 12/10/23, 2:18 PM

## 2023-12-10 NOTE — Plan of Care (Signed)

## 2023-12-10 NOTE — Care Management Important Message (Signed)
Important Message  Patient Details  Name: [name] MRN: [id number] Date of Birth: [birth year]   Important Message Given:  Yes - Medicare IM     Sherilyn Banker 12/10/2023, 10:39 AM

## 2023-12-10 NOTE — Consult Note (Signed)
Consultation Note Date: 12/10/2023   Patient Name: [name]  DOB: 1929/10/22  MRN: [id number]  Age / Sex: 89 y.o., female  PCP: Sallee Provencal, FNP Referring Physician: Charise Killian, MD  Reason for Consultation: Establishing goals of care   HPI/Brief Hospital Course: 89 y.o. female  with past medical history of aortic atherosclerosis, T2DM, HLD, depression, recent PE, history of significant GI bleed  admitted from home on 12/07/2023 with syncope and collapse.  Family reports assisting [name] to restroom when she become dizzy and "her legs buckled" EMS was called and she was transported to ED  CTA in ED Increased thrombus burden in the right pulmonary arteries since prior CT from 11/23/2023, likely representing interval increased thromboembolic burden. There is likely some degree of right heart strain with estimated RV/LV ratio roughly 1.45.  Stable consolidation opacity in the posterior left upper lobe, inferior lingula, posterior left lower lobe.  Some of these are again likely related to prior radiation therapy, some degree of pneumonia in the left lung cannot be excluded.  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  DVT (+)  1/22 underwent thrombectomy and IVC filter placement with vascular surgery, recovering well  Noted 4 IP admits within last 6 months  Palliative medicine was consulted for assisting with goals of care conversations.  Subjective:  Extensive chart review has been completed prior to meeting patient including labs, vital signs, imaging, progress notes, orders, and available advanced directive documents from current and previous encounters.  Visited with [name] at her bedside. She is awake, alert, oriented, sitting up in recliner and able to engage in goals of care conversations. Daughter-Cathy at bedside as well as daughter-in-law [name].  Introduced myself as a Scientist, water quality as a member of the palliative care team. Explained palliative medicine is specialized medical care for people living with serious illness. It focuses on providing relief from the symptoms and stress of a serious illness. The goal is to improve quality of life for both the patient and the family.   Family shares they are familiar with PMT services as [name] son (Janet's husband) was recently hospitalized and followed closed by PMT, was recently discharged home with hospice and passed away a few weeks ago.  Ms. Breau and her family share the great amount of stress they have been under with the recent illness of both [name] and her son and with his recent passing. Ms. Ege becomes tearful when speaking of her son.  [name] shares she is widowed, [name] now being her only living child. She lives independently very close to both [place] and [place]. They speak to [name]'s 5 admissions in the last year and both have become primary caregivers, assisting [name] with all ADL's. She is also blessed with many grandchildren and great grandchildren. 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  [name] shares she is feeling much improved since admission. She feels her breathing is much improved and denies acute symptoms. She and her daughters are able to share their understanding of current medical condition. They all remain anxious as she was recently started on low does Eliquis due to DVT/PE with a history of significant GI bleed.  They are able to share understanding of discharge disposition process. Hopeful PT will be able to evaluate her today and recommend SNF.  They all feel this would be the best next step prior to [name] returning home.  We discussed patient's current illness and what it means in the larger context of patient's on-going co-morbidities. Natural disease  trajectory and expectations at EOL were discussed.   Attempted to elicit goals of care. [name] becomes emotional when speaking to her recurrent admissions. She shares she feels well when she gets home but soon after she develops a new symptom leading her back to ED. She is hopeful for continued recovery and to be able to return home and stay home.  We discussed code status and the difference between Full Code versus Do Not Resuscitate. Encouraged patient/family to consider DNR/DNI status understanding evidenced based poor outcomes in similar hospitalized patients, as the cause of the arrest is likely associated with chronic/terminal disease rather than a reversible acute cardio-pulmonary event.  [name] is clear she wishes to remain Full Code, she again becomes emotional and tearful and quickly changes topic of discussion. Family shares the loss of her son has taken a toll on her and she has not been able to fully grieve his loss.  Family shares they were given an AD packet last admission but were not able to complete it. [name] shares she wishes to appoint [name] and [name] as Merchant navy officer.  I discussed importance of continued conversations with family/support persons and all members of their medical team regarding overall plan of care and treatment options ensuring decisions are in alignment with patients goals of care.  All questions/concerns addressed. Emotional support provided to patient/family/support persons. PMT will continue to follow and support patient as needed.  Objective: Primary Diagnoses: Present on Admission:  Pulmonary embolism (HCC)  Hypertension associated with diabetes (HCC)  Depression, recurrent (HCC)  Colon cancer (HCC)  GI bleeding  Essential hypertension  FTT (failure to thrive) in adult  Hyperlipidemia   Physical Exam Constitutional:      General: She is not in acute distress.    Appearance: She is ill-appearing.  Pulmonary:     Effort:  Pulmonary effort is normal. No respiratory distress.  Skin:    General: Skin is warm and dry.  Neurological:     Mental Status: She is alert and oriented to person, place, and time.     Motor: Weakness present.     Vital Signs: BP 105/68 (BP Location: Left Arm)   Pulse 80   Temp 97.7 F (36.5 C) (Oral)   Resp 17   Ht 5\' 2"  (1.575 m)   Wt 53 kg   SpO2 100%   BMI 21.37 kg/m  Pain Scale: 0-10 POSS *See Group Information*:  (resting with eyes closed) Pain Score: 0-No pain  IO: Intake/output summary:  Intake/Output Summary (Last 24 hours) at 12/10/2023 1422 Last data filed at 12/10/2023 0900 Gross per 24 hour  Intake 360 ml  Output --  Net 360 ml    LBM: Last BM Date : 12/08/23 Baseline Weight: Weight: 53 kg Most recent weight: Weight: 53 kg       Assessment and Plan  SUMMARY OF RECOMMENDATIONS   Full Code-Full Scope Ongoing GOC discussions PMT to continue to follow for ongoing needs and support  Palliative Prophylaxis:   Bowel Regimen, Delirium Protocol and Frequent Pain Assessment   Thank you for this consult and allowing Palliative Medicine to participate in the care of Glyn Ade. Palliative medicine will continue to follow and assist as needed.   Time Total: 75 minutes  Time  spent includes: Detailed review of medical records (labs, imaging, vital signs), medically appropriate exam (mental status, respiratory, cardiac, skin), discussed with treatment team, counseling and educating patient, family and staff, documenting clinical information, medication management and coordination of care.   Signed by: Leeanne Deed, DNP, AGNP-C Palliative Medicine    Please contact Palliative Medicine Team phone at [phone] for questions and concerns.  For individual provider: See Loretha Stapler

## 2023-12-10 NOTE — Progress Notes (Signed)
PROGRESS NOTE    [name]  ZOX:[id number] DOB: 01-Oct-1929 DOA: 12/07/2023 PCP: Sallee Provencal, FNP  Assessment & Plan:   Principal Problem:   Pulmonary embolism (HCC) Active Problems:   GI bleeding   Hypertension associated with diabetes (HCC)   History of CVA (cerebrovascular accident)   Hyperlipidemia   Colon cancer (HCC)   FTT (failure to thrive) in adult   Diabetes mellitus without complication (HCC)   Depression, recurrent (HCC)   Essential hypertension   DVT (deep venous thrombosis) (HCC)  Assessment and Plan:  Pulmonary embolism: s/p thrombectomy and IVC filter placement. IV heparin was d/c after procedure as pt has hx of GI bleed and started on low dose eliquis as per vasc surg recs. Continue on eliquis.    GI bleeding: H&H are labile. Will transfuse if Hb < 7.0   CAP: continue on IV ceftriaxone, azithromycin, bronchodilators & encourage incentive spirometry    DM2: well controlled, HbA1c 7.1. Continue on SSI w/ accuchecks    HLD: continue on statin    Colon cancer: management per onco outpatient    WJX:[id number] thrombus in the mid to distal right popliteal vein.  Thrombus in the right peroneal vein.  Thrombus in the left posterior tibial and peroneal veins. S/p IVC filter placed    DVT prophylaxis: eliquis  Code Status: full Family Communication: discussed pt's care w/ pt's family at bedside and answered their questions  Disposition Plan: PT recs HH now   Level of care: Progressive  Status is: Inpatient Remains inpatient appropriate because: severity of illness   Consultants:  Vasc surg   Procedures:   Antimicrobials: azithromycin, rocephin   Subjective: Pt c/o fatigue   Objective: Vitals:   12/09/23 1956 12/09/23 2342 12/10/23 0441 12/10/23 0923  BP: (!) 143/74 113/64 97/75 113/66  Pulse: 82 81 84 80  Resp: 16 18 18 18   Temp: 98.6 F (37 C) 98.5 F (36.9 C) 98 F (36.7 C) 98.2 F (36.8 C)  TempSrc: Oral Oral Oral Oral  SpO2:  100% 90% 95% 99%  Weight:      Height:        Intake/Output Summary (Last 24 hours) at 12/10/2023 1104 Last data filed at 12/10/2023 0900 Gross per 24 hour  Intake 360 ml  Output --  Net 360 ml   Filed Weights   12/07/23 1220  Weight: 53 kg    Examination:  General exam: Appears calm & comfortable  Respiratory system: decreased breath sounds b/l  Cardiovascular system: S1 & S2+. No rubs or clicks  Gastrointestinal system: abd is soft, NT, ND & hypoactive bowel sounds Central nervous system: alert & oriented. Moves all extremities  Psychiatry: Judgement and insight appears at baseline. Appropriate mood and affect     Data Reviewed: I have personally reviewed following labs and imaging studies  CBC: Recent Labs  Lab 12/07/23 1224 12/08/23 0356 12/09/23 0533 12/10/23 0433  WBC 10.3 7.4 6.3 6.0  NEUTROABS 8.8*  --   --   --   HGB 13.2 10.3* 9.8* 9.7*  HCT 42.3 33.1* 31.3* 31.8*  MCV 84.6 87.6 85.3 88.8  PLT 222 209 200 197   Basic Metabolic Panel: Recent Labs  Lab 12/07/23 1225 12/08/23 0356 12/09/23 0533 12/10/23 0433  NA 136 140 141 139  K 4.6 3.5 3.7 3.6  CL 100 107 108 107  CO2 23 25 25 24   GLUCOSE 226* 103* 132* 129*  BUN 11 9 9 11   CREATININE 0.81 0.59 0.59 0.64  CALCIUM 8.8* 8.3* 8.1* 8.4*   GFR: Estimated Creatinine Clearance: 35.5 mL/min (by C-G formula based on SCr of 0.64 mg/dL). Liver Function Tests: Recent Labs  Lab 12/07/23 1225  AST 37  ALT 22  ALKPHOS 121  BILITOT 1.6*  PROT 6.5  ALBUMIN 2.6*   No results for input(s): "LIPASE", "AMYLASE" in the last 168 hours. No results for input(s): "AMMONIA" in the last 168 hours. Coagulation Profile: Recent Labs  Lab 12/07/23 1224  INR 1.3*   Cardiac Enzymes: No results for input(s): "CKTOTAL", "CKMB", "CKMBINDEX", "TROPONINI" in the last 168 hours. BNP (last 3 results) No results for input(s): "PROBNP" in the last 8760 hours. HbA1C: No results for input(s): "HGBA1C" in the last 72  hours. CBG: Recent Labs  Lab 12/09/23 0815 12/09/23 1238 12/09/23 1556 12/09/23 2124 12/10/23 0753  GLUCAP 117* 129* 198* 185* 137*   Lipid Profile: No results for input(s): "CHOL", "HDL", "LDLCALC", "TRIG", "CHOLHDL", "LDLDIRECT" in the last 72 hours. Thyroid Function Tests: No results for input(s): "TSH", "T4TOTAL", "FREET4", "T3FREE", "THYROIDAB" in the last 72 hours. Anemia Panel: No results for input(s): "VITAMINB12", "FOLATE", "FERRITIN", "TIBC", "IRON", "RETICCTPCT" in the last 72 hours. Sepsis Labs: Recent Labs  Lab 12/07/23 1220 12/07/23 1431  LATICACIDVEN 2.9* 1.7    Recent Results (from the past 240 hours)  Culture, blood (Routine x 2)     Status: None (Preliminary result)   Collection Time: 12/07/23 12:24 PM   Specimen: BLOOD  Result Value Ref Range Status   Specimen Description BLOOD RIGHT ANTECUBITAL  Final   Special Requests   Final    BOTTLES DRAWN AEROBIC AND ANAEROBIC Blood Culture adequate volume   Culture   Final    NO GROWTH 3 DAYS Performed at [facility], [address], [place], [place] [address]    Report Status PENDING  Incomplete  MRSA Next Gen by PCR, Nasal     Status: None   Collection Time: 12/07/23  5:45 PM   Specimen: Nasal Mucosa; Nasal Swab  Result Value Ref Range Status   MRSA by PCR Next Gen NOT DETECTED NOT DETECTED Final    Comment: (NOTE) The GeneXpert MRSA Assay (FDA approved for NASAL specimens only), is one component of a comprehensive MRSA colonization surveillance program. It is not intended to diagnose MRSA infection nor to guide or monitor treatment for MRSA infections. Test performance is not FDA approved in patients less than 29 years old. Performed at [facility], [address], [place], [place] [address]   Urine Culture     Status: Abnormal   Collection Time: 12/08/23  7:21 AM   Specimen: Urine, Random  Result Value Ref Range Status   Specimen Description   Final    URINE,  RANDOM Performed at [facility], [address], [place], [place] [address]    Special Requests   Final    URINE, CLEAN CATCH Performed at [facility], [address], [place], [place] [address]    Culture 30,000 COLONIES/mL ENTEROCOCCUS FAECALIS (A)  Final   Report Status 12/10/2023 FINAL  Final   Organism ID, Bacteria ENTEROCOCCUS FAECALIS (A)  Final      Susceptibility   Enterococcus faecalis - MIC*    AMPICILLIN <=2 SENSITIVE Sensitive     NITROFURANTOIN <=16 SENSITIVE Sensitive     VANCOMYCIN 1 SENSITIVE Sensitive     * 30,000 COLONIES/mL ENTEROCOCCUS FAECALIS         Radiology Studies: ECHOCARDIOGRAM COMPLETE Result Date: 12/09/2023  ECHOCARDIOGRAM REPORT   Patient Name:   [name] Date of Exam: 12/09/2023 Medical Rec #:  [id number]      Height:       62.0 in Accession #:    [id number]     Weight:       116.8 lb Date of Birth:  May 10, 1929       BSA:          1.521 m Patient Age:    89 years       BP:           105/51 mmHg Patient Gender: F              HR:           74 bpm. Exam Location:  [facility] Procedure: 2D Echo, Cardiac Doppler and Color Doppler Indications:     Pulmonary embolus I26.09  History:         Patient has prior history of Echocardiogram examinations, most                  recent 08/12/2023. Stroke; Risk Factors:Diabetes. Aortic root                  dilatation, aortic stenosis.  Sonographer:     Cristela Blue Referring Phys:  [id number] Latina Craver SCHNIER Diagnosing Phys: Julien Nordmann MD IMPRESSIONS  1. Left ventricular ejection fraction, by estimation, is 60 to 65%. Left ventricular ejection fraction by PLAX is 71 %. The left ventricle has normal function. The left ventricle has no regional wall motion abnormalities. Left ventricular diastolic parameters are consistent with Grade I diastolic dysfunction (impaired relaxation).  2. Right ventricular systolic function is mildly reduced. The right ventricular size is mildly enlarged. There is normal pulmonary  artery systolic pressure. The estimated right ventricular systolic pressure is 30.4 mmHg.  3. The mitral valve is normal in structure. Mild mitral valve regurgitation. No evidence of mitral stenosis. The mean mitral valve gradient is 4.0 mmHg. Moderate mitral annular calcification.  4. The aortic valve is normal in structure. There is severe calcifcation of the aortic valve. Aortic valve regurgitation is not visualized. Moderate aortic valve stenosis. Aortic valve mean gradient measures 18.5 mmHg.  5. The inferior vena cava is normal in size with greater than 50% respiratory variability, suggesting right atrial pressure of 3 mmHg. FINDINGS  Left Ventricle: Left ventricular ejection fraction, by estimation, is 60 to 65%. Left ventricular ejection fraction by PLAX is 71 %. The left ventricle has normal function. The left ventricle has no regional wall motion abnormalities. The left ventricular internal cavity size was normal in size. There is no left ventricular hypertrophy. Left ventricular diastolic parameters are consistent with Grade I diastolic dysfunction (impaired relaxation). Right Ventricle: The right ventricular size is mildly enlarged. No increase in right ventricular wall thickness. Right ventricular systolic function is mildly reduced. There is normal pulmonary artery systolic pressure. The tricuspid regurgitant velocity  is 2.52 m/s, and with an assumed right atrial pressure of 5 mmHg, the estimated right ventricular systolic pressure is 30.4 mmHg. Left Atrium: Left atrial size was normal in size. Right Atrium: Right atrial size was normal in size. Pericardium: There is no evidence of pericardial effusion. Mitral Valve: The mitral valve is normal in structure. There is mild thickening of the mitral valve leaflet(s). There is mild calcification of the mitral valve leaflet(s). Moderate mitral annular calcification. Mild mitral valve regurgitation. No evidence of mitral valve stenosis. MV peak gradient,  10.4 mmHg.  The mean mitral valve gradient is 4.0 mmHg. Tricuspid Valve: The tricuspid valve is normal in structure. Tricuspid valve regurgitation is mild . No evidence of tricuspid stenosis. Aortic Valve: The aortic valve is normal in structure. There is severe calcifcation of the aortic valve. Aortic valve regurgitation is not visualized. Moderate aortic stenosis is present. Aortic valve mean gradient measures 18.5 mmHg. Aortic valve peak gradient measures 29.8 mmHg. Aortic valve area, by VTI measures 1.33 cm. Pulmonic Valve: The pulmonic valve was normal in structure. Pulmonic valve regurgitation is not visualized. No evidence of pulmonic stenosis. Aorta: The aortic root is normal in size and structure. Venous: The inferior vena cava is normal in size with greater than 50% respiratory variability, suggesting right atrial pressure of 3 mmHg. IAS/Shunts: No atrial level shunt detected by color flow Doppler. Additional Comments: There is a small pleural effusion in the left lateral region.  LEFT VENTRICLE PLAX 2D LV EF:         Left            Diastology                ventricular     LV e' medial:    3.48 cm/s                ejection        LV E/e' medial:  29.3                fraction by     LV e' lateral:   4.68 cm/s                PLAX is 71      LV E/e' lateral: 21.8                %. LVIDd:         4.50 cm LVIDs:         2.70 cm LV PW:         1.60 cm LV IVS:        1.00 cm LVOT diam:     2.00 cm LV SV:         69 LV SV Index:   45 LVOT Area:     3.14 cm  RIGHT VENTRICLE RV Basal diam:  3.70 cm RV Mid diam:    3.80 cm RV S prime:     17.00 cm/s TAPSE (M-mode): 2.2 cm LEFT ATRIUM             Index        RIGHT ATRIUM           Index LA diam:        3.90 cm 2.56 cm/m   RA Area:     10.50 cm LA Vol (A2C):   28.3 ml 18.60 ml/m  RA Volume:   18.50 ml  12.16 ml/m LA Vol (A4C):   31.6 ml 20.77 ml/m LA Biplane Vol: 30.2 ml 19.85 ml/m  AORTIC VALVE AV Area (Vmax):    1.07 cm AV Area (Vmean):   1.01 cm AV Area  (VTI):     1.33 cm AV Vmax:           273.00 cm/s AV Vmean:          198.000 cm/s AV VTI:            0.519 m AV Peak Grad:      29.8 mmHg AV Mean Grad:      18.5 mmHg LVOT  Vmax:         93.40 cm/s LVOT Vmean:        63.500 cm/s LVOT VTI:          0.219 m LVOT/AV VTI ratio: 0.42  AORTA Ao Root diam: 3.10 cm MITRAL VALVE                TRICUSPID VALVE MV Area (PHT): 2.33 cm     TR Peak grad:   25.4 mmHg MV Area VTI:   1.89 cm     TR Vmax:        252.00 cm/s MV Peak grad:  10.4 mmHg MV Mean grad:  4.0 mmHg     SHUNTS MV Vmax:       1.61 m/s     Systemic VTI:  0.22 m MV Vmean:      97.4 cm/s    Systemic Diam: 2.00 cm MV Decel Time: 325 msec MV E velocity: 102.00 cm/s MV A velocity: 149.00 cm/s MV E/A ratio:  0.68 Julien Nordmann MD Electronically signed by Julien Nordmann MD Signature Date/Time: 12/09/2023/2:44:14 PM    Final         Scheduled Meds:  apixaban  2.5 mg Oral BID   atorvastatin  40 mg Oral QHS   cholecalciferol  1,000 Units Oral Daily   feeding supplement  237 mL Oral BID BM   insulin aspart  0-15 Units Subcutaneous TID WC   insulin aspart  0-5 Units Subcutaneous QHS   pantoprazole  40 mg Oral Daily   PARoxetine  10 mg Oral Daily   temazepam  15 mg Oral QHS   Continuous Infusions:  azithromycin 500 mg (12/09/23 1734)   cefTRIAXone (ROCEPHIN)  IV 2 g (12/09/23 2211)     LOS: 3 days       Charise Killian, MD [facility] Pager [phone]  If 7PM-7AM, please contact night-coverage  12/10/2023, 11:04 AM

## 2023-12-11 DIAGNOSIS — R627 Adult failure to thrive: Secondary | ICD-10-CM

## 2023-12-11 DIAGNOSIS — Z515 Encounter for palliative care: Secondary | ICD-10-CM | POA: Diagnosis not present

## 2023-12-11 DIAGNOSIS — I2609 Other pulmonary embolism with acute cor pulmonale: Secondary | ICD-10-CM | POA: Diagnosis not present

## 2023-12-11 DIAGNOSIS — Z8673 Personal history of transient ischemic attack (TIA), and cerebral infarction without residual deficits: Secondary | ICD-10-CM | POA: Diagnosis not present

## 2023-12-11 LAB — BASIC METABOLIC PANEL
Anion gap: 9 (ref 5–15)
BUN: 9 mg/dL (ref 8–23)
CO2: 24 mmol/L (ref 22–32)
Calcium: 8.3 mg/dL — ABNORMAL LOW (ref 8.9–10.3)
Chloride: 103 mmol/L (ref 98–111)
Creatinine, Ser: 0.6 mg/dL (ref 0.44–1.00)
GFR, Estimated: >60 mL/min (ref >60)
Glucose, Bld: 132 mg/dL — ABNORMAL HIGH (ref 70–99)
Potassium: 3.5 mmol/L (ref 3.5–5.1)
Sodium: 136 mmol/L (ref 135–145)

## 2023-12-11 LAB — CBC
HCT: 30.2 % — ABNORMAL LOW (ref 36.0–46.0)
Hemoglobin: 9.3 g/dL — ABNORMAL LOW (ref 12.0–15.0)
MCH: 26.3 pg (ref 26.0–34.0)
MCHC: 30.8 g/dL (ref 30.0–36.0)
MCV: 85.3 fL (ref 80.0–100.0)
Platelets: 187 10*3/uL (ref 150–400)
RBC: 3.54 MIL/uL — ABNORMAL LOW (ref 3.87–5.11)
RDW: 17.5 % — ABNORMAL HIGH (ref 11.5–15.5)
WBC: 5.8 10*3/uL (ref 4.0–10.5)
nRBC: 0 % (ref 0.0–0.2)

## 2023-12-11 LAB — GLUCOSE, CAPILLARY
Glucose-Capillary: 122 mg/dL — ABNORMAL HIGH (ref 70–99)
Glucose-Capillary: 259 mg/dL — ABNORMAL HIGH (ref 70–99)
Glucose-Capillary: 144 mg/dL — ABNORMAL HIGH (ref 70–99)

## 2023-12-11 NOTE — Plan of Care (Signed)
  Problem: Education: Goal: Ability to describe self-care measures that may prevent or decrease complications (Diabetes Survival Skills Education) will improve Outcome: Progressing   Problem: Education: Goal: Ability to describe self-care measures that may prevent or decrease complications (Diabetes Survival Skills Education) will improve Outcome: Progressing Goal: Individualized Educational Video(s) Outcome: Progressing   Problem: Coping: Goal: Ability to adjust to condition or change in health will improve Outcome: Progressing   Problem: Fluid Volume: Goal: Ability to maintain a balanced intake and output will improve Outcome: Progressing   Problem: Health Behavior/Discharge Planning: Goal: Ability to identify and utilize available resources and services will improve Outcome: Progressing Goal: Ability to manage health-related needs will improve Outcome: Progressing   Problem: Metabolic: Goal: Ability to maintain appropriate glucose levels will improve Outcome: Progressing   Problem: Nutritional: Goal: Maintenance of adequate nutrition will improve Outcome: Progressing Goal: Progress toward achieving an optimal weight will improve Outcome: Progressing   Problem: Skin Integrity: Goal: Risk for impaired skin integrity will decrease Outcome: Progressing   Problem: Tissue Perfusion: Goal: Adequacy of tissue perfusion will improve Outcome: Progressing   Problem: Education: Goal: Knowledge of General Education information will improve Description: Including pain rating scale, medication(s)/side effects and non-pharmacologic comfort measures Outcome: Progressing   Problem: Health Behavior/Discharge Planning: Goal: Ability to manage health-related needs will improve Outcome: Progressing   Problem: Clinical Measurements: Goal: Ability to maintain clinical measurements within normal limits will improve Outcome: Progressing Goal: Will remain free from infection Outcome:  Progressing Goal: Diagnostic test results will improve Outcome: Progressing Goal: Respiratory complications will improve Outcome: Progressing Goal: Cardiovascular complication will be avoided Outcome: Progressing   Problem: Activity: Goal: Risk for activity intolerance will decrease Outcome: Progressing   Problem: Nutrition: Goal: Adequate nutrition will be maintained Outcome: Progressing   Problem: Coping: Goal: Level of anxiety will decrease Outcome: Progressing   Problem: Elimination: Goal: Will not experience complications related to bowel motility Outcome: Progressing Goal: Will not experience complications related to urinary retention Outcome: Progressing   Problem: Pain Managment: Goal: General experience of comfort will improve and/or be controlled Outcome: Progressing   Problem: Safety: Goal: Ability to remain free from injury will improve Outcome: Progressing   Problem: Skin Integrity: Goal: Risk for impaired skin integrity will decrease Outcome: Progressing

## 2023-12-11 NOTE — Plan of Care (Signed)

## 2023-12-11 NOTE — Progress Notes (Signed)
PROGRESS NOTE    [name]  WUJ:[id number] DOB: 17-Mar-1929 DOA: 12/07/2023 PCP: Sallee Provencal, FNP  Assessment & Plan:   Principal Problem:   Pulmonary embolism (HCC) Active Problems:   GI bleeding   Hypertension associated with diabetes (HCC)   History of CVA (cerebrovascular accident)   Hyperlipidemia   Colon cancer (HCC)   FTT (failure to thrive) in adult   Diabetes mellitus without complication (HCC)   Depression, recurrent (HCC)   Essential hypertension   DVT (deep venous thrombosis) (HCC)  Assessment and Plan:  Pulmonary embolism: s/p thrombectomy and IVC filter placement. IV heparin was d/c after procedure as pt has hx of GI bleed and started on low dose eliquis as per vasc surg recs. Continue on eliquis    GI bleeding: H&H are trending down slightly. No need for a transfusion currently. Will need to get CBC as an outpatient w/in 1 week to check H&H  CAP: continue on IV rocephin, azithromycin, bronchodilators & encourage incentive spirometry    DM2: well controlled, HbA1c 7.1. Continue on SSI w/ accuchecks    HLD: continue on statin    Colon cancer: management per onco outpatient    [id number] thrombus in the mid to distal right popliteal vein.  Thrombus in the right peroneal vein.  Thrombus in the left posterior tibial and peroneal veins. S/p IVC filter placed    DVT prophylaxis: eliquis  Code Status: full Family Communication: discussed pt's care w/ pt's family at bedside and answered their questions  Disposition Plan: PT recs HH now   Level of care: Progressive  Status is: Inpatient Remains inpatient appropriate because: severity of illness   Consultants:  Vasc surg   Procedures:   Antimicrobials: azithromycin, rocephin   Subjective: Pt c/o generalized weakness  Objective: Vitals:   12/11/23 0012 12/11/23 0528 12/11/23 0900 12/11/23 1149  BP: (!) 104/55 (!) 108/56 (!) 115/57 133/68  Pulse: 82 70 76 72  Resp: 18 16 18 18   Temp:  98.7 F (37.1 C) 98.5 F (36.9 C) 97.7 F (36.5 C) 98.6 F (37 C)  TempSrc: Oral Oral Oral Oral  SpO2: 94% 93% 97% 92%  Weight:      Height:        Intake/Output Summary (Last 24 hours) at 12/11/2023 1450 Last data filed at 12/11/2023 1419 Gross per 24 hour  Intake 120 ml  Output --  Net 120 ml   Filed Weights   12/07/23 1220  Weight: 53 kg    Examination:  General exam: Appears comfortable  Respiratory system: diminished breath sounds b/l  Cardiovascular system: S1/S2+. No rubs or clicks  Gastrointestinal system: abd is soft, NT, ND & normal bowel sounds  Central nervous system: alert & awake. Moves all extremities  Psychiatry: judgement and insight appears at baseline. Flat mood and affect    Data Reviewed: I have personally reviewed following labs and imaging studies  CBC: Recent Labs  Lab 12/07/23 1224 12/08/23 0356 12/09/23 0533 12/10/23 0433 12/11/23 0532  WBC 10.3 7.4 6.3 6.0 5.8  NEUTROABS 8.8*  --   --   --   --   HGB 13.2 10.3* 9.8* 9.7* 9.3*  HCT 42.3 33.1* 31.3* 31.8* 30.2*  MCV 84.6 87.6 85.3 88.8 85.3  PLT 222 209 200 197 187   Basic Metabolic Panel: Recent Labs  Lab 12/07/23 1225 12/08/23 0356 12/09/23 0533 12/10/23 0433 12/11/23 0532  NA 136 140 141 139 136  K 4.6 3.5 3.7 3.6 3.5  CL 100  107 108 107 103  CO2 23 25 25 24 24   GLUCOSE 226* 103* 132* 129* 132*  BUN 11 9 9 11 9   CREATININE 0.81 0.59 0.59 0.64 0.60  CALCIUM 8.8* 8.3* 8.1* 8.4* 8.3*   GFR: Estimated Creatinine Clearance: 35.5 mL/min (by C-G formula based on SCr of 0.6 mg/dL). Liver Function Tests: Recent Labs  Lab 12/07/23 1225  AST 37  ALT 22  ALKPHOS 121  BILITOT 1.6*  PROT 6.5  ALBUMIN 2.6*   No results for input(s): "LIPASE", "AMYLASE" in the last 168 hours. No results for input(s): "AMMONIA" in the last 168 hours. Coagulation Profile: Recent Labs  Lab 12/07/23 1224  INR 1.3*   Cardiac Enzymes: No results for input(s): "CKTOTAL", "CKMB", "CKMBINDEX",  "TROPONINI" in the last 168 hours. BNP (last 3 results) No results for input(s): "PROBNP" in the last 8760 hours. HbA1C: No results for input(s): "HGBA1C" in the last 72 hours. CBG: Recent Labs  Lab 12/10/23 0753 12/10/23 1204 12/10/23 1627 12/10/23 2037 12/11/23 0832  GLUCAP 137* 290* 131* 114* 122*   Lipid Profile: No results for input(s): "CHOL", "HDL", "LDLCALC", "TRIG", "CHOLHDL", "LDLDIRECT" in the last 72 hours. Thyroid Function Tests: No results for input(s): "TSH", "T4TOTAL", "FREET4", "T3FREE", "THYROIDAB" in the last 72 hours. Anemia Panel: No results for input(s): "VITAMINB12", "FOLATE", "FERRITIN", "TIBC", "IRON", "RETICCTPCT" in the last 72 hours. Sepsis Labs: Recent Labs  Lab 12/07/23 1220 12/07/23 1431  LATICACIDVEN 2.9* 1.7    Recent Results (from the past 240 hours)  Culture, blood (Routine x 2)     Status: None (Preliminary result)   Collection Time: 12/07/23 12:24 PM   Specimen: BLOOD  Result Value Ref Range Status   Specimen Description BLOOD RIGHT ANTECUBITAL  Final   Special Requests   Final    BOTTLES DRAWN AEROBIC AND ANAEROBIC Blood Culture adequate volume   Culture   Final    NO GROWTH 4 DAYS Performed at [facility], [address], [place], [place] [address]    Report Status PENDING  Incomplete  MRSA Next Gen by PCR, Nasal     Status: None   Collection Time: 12/07/23  5:45 PM   Specimen: Nasal Mucosa; Nasal Swab  Result Value Ref Range Status   MRSA by PCR Next Gen NOT DETECTED NOT DETECTED Final    Comment: (NOTE) The GeneXpert MRSA Assay (FDA approved for NASAL specimens only), is one component of a comprehensive MRSA colonization surveillance program. It is not intended to diagnose MRSA infection nor to guide or monitor treatment for MRSA infections. Test performance is not FDA approved in patients less than 30 years old. Performed at [facility], [address] [place], [place] [address]   Urine Culture      Status: Abnormal   Collection Time: 12/08/23  7:21 AM   Specimen: Urine, Random  Result Value Ref Range Status   Specimen Description   Final    URINE, RANDOM Performed at [facility], [address] [place], [place] [address]    Special Requests   Final    URINE, CLEAN CATCH Performed at [facility], [address] [place], [place] [address]    Culture 30,000 COLONIES/mL ENTEROCOCCUS FAECALIS (A)  Final   Report Status 12/10/2023 FINAL  Final   Organism ID, Bacteria ENTEROCOCCUS FAECALIS (A)  Final      Susceptibility   Enterococcus faecalis - MIC*    AMPICILLIN <=2 SENSITIVE Sensitive     NITROFURANTOIN <=16 SENSITIVE  Sensitive     VANCOMYCIN 1 SENSITIVE Sensitive     * 30,000 COLONIES/mL ENTEROCOCCUS FAECALIS         Radiology Studies: No results found.       Scheduled Meds:  apixaban  2.5 mg Oral BID   atorvastatin  40 mg Oral QHS   azithromycin  500 mg Oral Daily   cholecalciferol  1,000 Units Oral Daily   feeding supplement  237 mL Oral BID BM   insulin aspart  0-15 Units Subcutaneous TID WC   insulin aspart  0-5 Units Subcutaneous QHS   pantoprazole  40 mg Oral Daily   PARoxetine  10 mg Oral Daily   temazepam  15 mg Oral QHS   Continuous Infusions:  cefTRIAXone (ROCEPHIN)  IV Stopped (12/11/23 0116)     LOS: 4 days       Charise Killian, MD [facility] Pager [phone]  If 7PM-7AM, please contact night-coverage  12/11/2023, 2:50 PM

## 2023-12-11 NOTE — Progress Notes (Signed)
Mobility Specialist - Progress Note     12/11/23 1312  Mobility  Activity Ambulated with assistance in hallway  Level of Assistance Standby assist, set-up cues, supervision of patient - no hands on  Assistive Device Front wheel walker  Distance Ambulated (ft) 100 ft  Range of Motion/Exercises Active  Activity Response Tolerated well  Mobility Referral Yes  Mobility visit 1 Mobility  Mobility Specialist Start Time (ACUTE ONLY) 1256  Mobility Specialist Stop Time (ACUTE ONLY) 1312  Mobility Specialist Time Calculation (min) (ACUTE ONLY) 16 min   Pt resting in bed on RA upon entry. Pt STS and ambulates to hallway around NS SBA with RW. Pt endorses not feeling well but motivated to get out of bed. Pt returned to recliner and left with needs in reach.   [name] Mobility Specialist 12/11/23, 1:18 PM

## 2023-12-11 NOTE — Progress Notes (Signed)
PT Cancellation Note  Patient Details Name: [name] MRN: [id number] DOB: [birth year]   Cancelled Treatment:     PT attempt. Upon entering room, pt was sitting on [facility] with family member assisting. Requested privacy. Will return at a later time/date.    Rushie Chestnut 12/11/2023, 4:04 PM

## 2023-12-11 NOTE — Progress Notes (Signed)
Daily Progress Note   Patient Name: [name]       Date: 12/11/2023 DOB: [birth year]  Age: 89 y.o. MRN#: [id number] Attending Physician: Charise Killian, MD Primary Care Physician: Sallee Provencal, FNP Admit Date: 12/07/2023  Reason for Consultation/Follow-up: Establishing goals of care  HPI/Brief Hospital Review: 89 y.o. female  with past medical history of aortic atherosclerosis, T2DM, HLD, depression, recent PE, history of significant GI bleed  admitted from home on 12/07/2023 with syncope and collapse.   Family reports assisting [name] to restroom when she become dizzy and "her legs buckled" EMS was called and she was transported to ED   CTA in ED Increased thrombus burden in the right pulmonary arteries since prior CT from 11/23/2023, likely representing interval increased thromboembolic burden. There is likely some degree of right heart strain with estimated RV/LV ratio roughly 1.45.  Stable consolidation opacity in the posterior left upper lobe, inferior lingula, posterior left lower lobe.  Some of these are again likely related to prior radiation therapy, some degree of pneumonia in the left lung cannot be excluded.  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   DVT (+)   1/22 underwent thrombectomy and IVC filter placement with vascular surgery, recovering well   Noted 4 IP admits within last 6 months   Palliative medicine was consulted for assisting with goals of care conversations.  Subjective: Extensive chart review has been completed prior to meeting patient including labs, vital signs, imaging, progress notes, orders, and available advanced directive documents from current and previous encounters.    Visited with [name] at  her bedside. She is resting in bed, acknowledges my presence in room. She reports feeling sleepy and drained today, denies acute symptoms of pain or discomfort. Feels as though her body needs rest. Daughter and daughter in law at bedside.  Family concerned related to drop in hemoglobin. Discussed with family slight drop but hemoglobin remains stable at this time, family denies signs of acute blood loss. Discussed with family the significance of recent events such as thrombectomy and her advanced age. Family voices understanding, they remain hopeful for continued recovery.  Answered and addressed all questions and concerns. PMT to continue to follow for ongoing needs and support.  Thank you for allowing the Palliative  Medicine Team to assist in the care of this patient.  Total time:  25 minutes  Time spent includes: Detailed review of medical records (labs, imaging, vital signs), medically appropriate exam (mental status, respiratory, cardiac, skin), discussed with treatment team, counseling and educating patient, family and staff, documenting clinical information, medication management and coordination of care.  Leeanne Deed, DNP, AGNP-C Palliative Medicine   Please contact Palliative Medicine Team phone at [phone] for questions and concerns.

## 2023-12-12 DIAGNOSIS — Z515 Encounter for palliative care: Secondary | ICD-10-CM | POA: Diagnosis not present

## 2023-12-12 DIAGNOSIS — I2609 Other pulmonary embolism with acute cor pulmonale: Secondary | ICD-10-CM | POA: Diagnosis not present

## 2023-12-12 LAB — CBC
HCT: 30 % — ABNORMAL LOW (ref 36.0–46.0)
Hemoglobin: 9.7 g/dL — ABNORMAL LOW (ref 12.0–15.0)
MCH: 27.3 pg (ref 26.0–34.0)
MCHC: 32.3 g/dL (ref 30.0–36.0)
MCV: 84.5 fL (ref 80.0–100.0)
Platelets: 190 10*3/uL (ref 150–400)
RBC: 3.55 MIL/uL — ABNORMAL LOW (ref 3.87–5.11)
RDW: 17.7 % — ABNORMAL HIGH (ref 11.5–15.5)
WBC: 6.5 10*3/uL (ref 4.0–10.5)
nRBC: 0 % (ref 0.0–0.2)

## 2023-12-12 LAB — BASIC METABOLIC PANEL
Anion gap: 9 (ref 5–15)
BUN: 10 mg/dL (ref 8–23)
CO2: 26 mmol/L (ref 22–32)
Calcium: 8.2 mg/dL — ABNORMAL LOW (ref 8.9–10.3)
Chloride: 104 mmol/L (ref 98–111)
Creatinine, Ser: 0.68 mg/dL (ref 0.44–1.00)
GFR, Estimated: >60 mL/min (ref >60)
Glucose, Bld: 144 mg/dL — ABNORMAL HIGH (ref 70–99)
Potassium: 3.7 mmol/L (ref 3.5–5.1)
Sodium: 139 mmol/L (ref 135–145)

## 2023-12-12 LAB — CULTURE, BLOOD (ROUTINE X 2)
Culture: NO GROWTH
Special Requests: ADEQUATE

## 2023-12-12 LAB — GLUCOSE, CAPILLARY: Glucose-Capillary: 133 mg/dL — ABNORMAL HIGH (ref 70–99)

## 2023-12-12 MED ORDER — PANTOPRAZOLE SODIUM 40 MG PO TBEC: 40.0000 mg | DELAYED_RELEASE_TABLET | Freq: Every day | ORAL | 0 refills | Status: DC

## 2023-12-12 MED ORDER — APIXABAN 2.5 MG PO TABS: 2.5000 mg | ORAL_TABLET | Freq: Two times a day (BID) | ORAL | 0 refills | Status: DC

## 2023-12-12 NOTE — Progress Notes (Signed)
Daily Progress Note   Patient Name: [name]       Date: 12/12/2023 DOB: [birth year]  Age: 89 y.o. MRN#: [id number] Attending Physician: No att. providers found Primary Care Physician: Sallee Provencal, FNP Admit Date: 12/07/2023  Reason for Consultation/Follow-up: Establishing goals of care  HPI/Brief Hospital Review: 89 y.o. female  with past medical history of aortic atherosclerosis, T2DM, HLD, depression, recent PE, history of significant GI bleed  admitted from home on 12/07/2023 with syncope and collapse.   Family reports assisting [name] to restroom when she become dizzy and "her legs buckled" EMS was called and she was transported to ED   CTA in ED Increased thrombus burden in the right pulmonary arteries since prior CT from 11/23/2023, likely representing interval increased thromboembolic burden. There is likely some degree of right heart strain with estimated RV/LV ratio roughly 1.45.  Stable consolidation opacity in the posterior left upper lobe, inferior lingula, posterior left lower lobe.  Some of these are again likely related to prior radiation therapy, some degree of pneumonia in the left lung cannot be excluded.  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   DVT (+)   1/22 underwent thrombectomy and IVC filter placement with vascular surgery, recovering well   Noted 4 IP admits within last 6 months   Palliative medicine was consulted for assisting with goals of care conversations.  Subjective: Extensive chart review has been completed prior to meeting patient including labs, vital signs, imaging, progress notes, orders, and available advanced directive documents from current and previous encounters.    Visited with [name] at  her bedside. She is awake and alert in bed. Reports feeling much better compared to yesterday. Daughter and daughter in law at bedside. Family reports plan to discharge home today. Hemoglobin has remained stable.  Encouraged family to continue goals of care conversations. Answered and addressed all questions and concerns.  Thank you for allowing the Palliative Medicine Team to assist in the care of this patient.  Total time:  25 minutes  Time spent includes: Detailed review of medical records (labs, imaging, vital signs), medically appropriate exam (mental status, respiratory, cardiac, skin), discussed with treatment team, counseling and educating patient, family and staff, documenting clinical information, medication management and coordination of care.  [name]  Karene Fry, AGNP-C Palliative Medicine   Please contact Palliative Medicine Team phone at [phone] for questions and concerns.

## 2023-12-12 NOTE — Discharge Summary (Signed)
Physician Discharge Summary  [name] ZOX:[id number] DOB: 1929-08-10 DOA: 12/07/2023  PCP: Whitney Provencal, FNP  Admit date: 12/07/2023 Discharge date: 12/12/2023  Admitted From: home  Disposition:  home   Recommendations for Outpatient Follow-up:  Follow up with PCP in 1-2 weeks F/u w/ onco in 1-2 weeks   Home Health: yes  Equipment/Devices:  Discharge Condition: stable  CODE STATUS: full  Diet recommendation: Heart Healthy / Carb Modified   Brief/Interim Summary: HPI was taken from Dr. Sedalia Schneider: [name] is a 89 year old female with history of aortic atherosclerosis, non-insulin-dependent diabetes mellitus, hyperlipidemia, depression, recent PE, history of GI bleed, who presents emergency department for chief concerns of syncope and collapse.   Vitals in the ED showed temperature of 97.8, respiration 24, heart rate of 110, blood pressure 112/69, SpO2 93% on 4 L nasal cannula.   Serum sodium is 136, potassium 4.6, chloride 100, bicarb 23, BUN of 11, serum creatinine of 0.81, EGFR greater than 60, nonfasting blood glucose 226, WBC 10.3, hemoglobin 13.2, platelets of 222.   High since troponin is 575.   Blood cultures x 2 are in process   CTA PE: Was read as increased thrombus burden in the right pulmonary arteries since prior CT from 11/23/2023, likely representing interval increased thromboembolic burden. There is likely some degree of right heart strain with estimated RV/LV ratio roughly 1.45.  Stable consolidation opacity in the posterior left upper lobe, inferior lingula, posterior left lower lobe.  Some of these are again likely related to prior radiation therapy, some degree of pneumonia in the left lung cannot be excluded.  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   ED treatment: Vancomycin, aztreonam, heparin per pharmacy. ------------------------- At bedside, patient able to tell me her name, age, location, current calendar  year.   She reports that she had a witnessed syncopal event today.  Patient currently denies chest pain, shortness of breath, dysuria, hematuria, diarrhea.   She reports that she is hungry and also she would like to urinate.   Patient last meal was 6:30 PM on 12/06/2023.  Discharge Diagnoses:  Principal Problem:   Pulmonary embolism (HCC) Active Problems:   GI bleeding   Hypertension associated with diabetes (HCC)   History of CVA (cerebrovascular accident)   Hyperlipidemia   Colon cancer (HCC)   FTT (failure to thrive) in adult   Diabetes mellitus without complication (HCC)   Depression, recurrent (HCC)   Essential hypertension   DVT (deep venous thrombosis) (HCC)  Pulmonary embolism: s/p thrombectomy and IVC filter placement. IV heparin was d/c after procedure as pt has hx of GI bleed and started on low dose eliquis as per vasc surg recs. Continue on eliquis    GI bleeding: H&H are labile. No need for a transfusion currently. Will need to get CBC as an outpatient w/in 1 week to check H&H   CAP: completed abx course. Continue on bronchodilators & encourage incentive spirometry    DM2: well controlled, HbA1c 7.1. Continue on SSI w/ accuchecks    HLD: continue on statin    Colon cancer: management per onco outpatient    WJX:BJYNWGNFA thrombus in the mid to distal right popliteal vein.  Thrombus in the right peroneal vein.  Thrombus in the left posterior tibial and peroneal veins. S/p IVC filter placed     Discharge Instructions  Discharge Instructions     Diet general   Complete by: As directed    Discharge instructions  Complete by: As directed    F/u w/ PCP w/in 1-2 weeks. F/u w/ onco in 1-2 weeks. Get CBC w/in 1 week to check Hb & Hct.   Increase activity slowly   Complete by: As directed       Allergies as of 12/12/2023       Reactions   Codeine    Nitrofurantoin    Other reaction(s): Other (See Comments)   Tramadol Nausea Only   Doxycycline Rash    Penicillins Swelling, Rash, Other (See Comments)   Throat swelling    Sulfa Antibiotics Rash        Medication List     TAKE these medications    apixaban 2.5 MG Tabs tablet Commonly known as: ELIQUIS Take 1 tablet (2.5 mg total) by mouth 2 (two) times daily.   atorvastatin 40 MG tablet Commonly known as: LIPITOR Take 1 tablet (40 mg total) by mouth daily.   cholecalciferol 25 MCG (1000 UNIT) tablet Commonly known as: VITAMIN D3 Take 1,000 Units by mouth daily.   glipiZIDE 5 MG 24 hr tablet Commonly known as: GLUCOTROL XL Take 1 tablet (5 mg total) by mouth daily with breakfast.   MIRALAX PO Take by mouth.   nystatin 100000 UNIT/ML suspension Commonly known as: MYCOSTATIN Take 5 mLs (500,000 Units total) by mouth 4 (four) times daily for 14 days. Shake well before use.  Measure liquid doses carefully to 5mL  Swish it in your mouth as long as you can before swallowing.   pantoprazole 40 MG tablet Commonly known as: PROTONIX Take 1 tablet (40 mg total) by mouth daily.   PARoxetine 10 MG tablet Commonly known as: Paxil Take 1 tablet (10 mg total) by mouth daily.   simethicone 125 MG chewable tablet Commonly known as: MYLICON Chew 1 tablet (125 mg total) by mouth every 6 (six) hours as needed for flatulence.   temazepam 15 MG capsule Commonly known as: RESTORIL TAKE 1 CAPSULE BY MOUTH AT BEDTIME AS NEEDED FOR SLEEP        Allergies  Allergen Reactions   Codeine    Nitrofurantoin     Other reaction(s): Other (See Comments)   Tramadol Nausea Only   Doxycycline Rash   Penicillins Swelling, Rash and Other (See Comments)    Throat swelling    Sulfa Antibiotics Rash    Consultations: Vasc surg   Procedures/Studies: ECHOCARDIOGRAM COMPLETE Result Date: 12/09/2023    ECHOCARDIOGRAM REPORT   Patient Name:   [name] Date of Exam: 12/09/2023 Medical Rec #:  [id number]      Height:       62.0 in Accession #:    [id number]     Weight:       116.8 lb Date of  Birth:  07-13-1929       BSA:          1.521 m Patient Age:    89 years       BP:           105/51 mmHg Patient Gender: F              HR:           74 bpm. Exam Location:  [facility] Procedure: 2D Echo, Cardiac Doppler and Color Doppler Indications:     Pulmonary embolus I26.09  History:         Patient has prior history of Echocardiogram examinations, most  recent 08/12/2023. Stroke; Risk Factors:Diabetes. Aortic root                  dilatation, aortic stenosis.  Sonographer:     Cristela Blue Referring Phys:  [id number] Latina Craver SCHNIER Diagnosing Phys: Julien Nordmann MD IMPRESSIONS  1. Left ventricular ejection fraction, by estimation, is 60 to 65%. Left ventricular ejection fraction by PLAX is 71 %. The left ventricle has normal function. The left ventricle has no regional wall motion abnormalities. Left ventricular diastolic parameters are consistent with Grade I diastolic dysfunction (impaired relaxation).  2. Right ventricular systolic function is mildly reduced. The right ventricular size is mildly enlarged. There is normal pulmonary artery systolic pressure. The estimated right ventricular systolic pressure is 30.4 mmHg.  3. The mitral valve is normal in structure. Mild mitral valve regurgitation. No evidence of mitral stenosis. The mean mitral valve gradient is 4.0 mmHg. Moderate mitral annular calcification.  4. The aortic valve is normal in structure. There is severe calcifcation of the aortic valve. Aortic valve regurgitation is not visualized. Moderate aortic valve stenosis. Aortic valve mean gradient measures 18.5 mmHg.  5. The inferior vena cava is normal in size with greater than 50% respiratory variability, suggesting right atrial pressure of 3 mmHg. FINDINGS  Left Ventricle: Left ventricular ejection fraction, by estimation, is 60 to 65%. Left ventricular ejection fraction by PLAX is 71 %. The left ventricle has normal function. The left ventricle has no regional wall motion abnormalities. The  left ventricular internal cavity size was normal in size. There is no left ventricular hypertrophy. Left ventricular diastolic parameters are consistent with Grade I diastolic dysfunction (impaired relaxation). Right Ventricle: The right ventricular size is mildly enlarged. No increase in right ventricular wall thickness. Right ventricular systolic function is mildly reduced. There is normal pulmonary artery systolic pressure. The tricuspid regurgitant velocity  is 2.52 m/s, and with an assumed right atrial pressure of 5 mmHg, the estimated right ventricular systolic pressure is 30.4 mmHg. Left Atrium: Left atrial size was normal in size. Right Atrium: Right atrial size was normal in size. Pericardium: There is no evidence of pericardial effusion. Mitral Valve: The mitral valve is normal in structure. There is mild thickening of the mitral valve leaflet(s). There is mild calcification of the mitral valve leaflet(s). Moderate mitral annular calcification. Mild mitral valve regurgitation. No evidence of mitral valve stenosis. MV peak gradient, 10.4 mmHg. The mean mitral valve gradient is 4.0 mmHg. Tricuspid Valve: The tricuspid valve is normal in structure. Tricuspid valve regurgitation is mild . No evidence of tricuspid stenosis. Aortic Valve: The aortic valve is normal in structure. There is severe calcifcation of the aortic valve. Aortic valve regurgitation is not visualized. Moderate aortic stenosis is present. Aortic valve mean gradient measures 18.5 mmHg. Aortic valve peak gradient measures 29.8 mmHg. Aortic valve area, by VTI measures 1.33 cm. Pulmonic Valve: The pulmonic valve was normal in structure. Pulmonic valve regurgitation is not visualized. No evidence of pulmonic stenosis. Aorta: The aortic root is normal in size and structure. Venous: The inferior vena cava is normal in size with greater than 50% respiratory variability, suggesting right atrial pressure of 3 mmHg. IAS/Shunts: No atrial level shunt  detected by color flow Doppler. Additional Comments: There is a small pleural effusion in the left lateral region.  LEFT VENTRICLE PLAX 2D LV EF:         Left            Diastology  ventricular     LV e' medial:    3.48 cm/s                ejection        LV E/e' medial:  29.3                fraction by     LV e' lateral:   4.68 cm/s                PLAX is 71      LV E/e' lateral: 21.8                %. LVIDd:         4.50 cm LVIDs:         2.70 cm LV PW:         1.60 cm LV IVS:        1.00 cm LVOT diam:     2.00 cm LV SV:         69 LV SV Index:   45 LVOT Area:     3.14 cm  RIGHT VENTRICLE RV Basal diam:  3.70 cm RV Mid diam:    3.80 cm RV S prime:     17.00 cm/s TAPSE (M-mode): 2.2 cm LEFT ATRIUM             Index        RIGHT ATRIUM           Index LA diam:        3.90 cm 2.56 cm/m   RA Area:     10.50 cm LA Vol (A2C):   28.3 ml 18.60 ml/m  RA Volume:   18.50 ml  12.16 ml/m LA Vol (A4C):   31.6 ml 20.77 ml/m LA Biplane Vol: 30.2 ml 19.85 ml/m  AORTIC VALVE AV Area (Vmax):    1.07 cm AV Area (Vmean):   1.01 cm AV Area (VTI):     1.33 cm AV Vmax:           273.00 cm/s AV Vmean:          198.000 cm/s AV VTI:            0.519 m AV Peak Grad:      29.8 mmHg AV Mean Grad:      18.5 mmHg LVOT Vmax:         93.40 cm/s LVOT Vmean:        63.500 cm/s LVOT VTI:          0.219 m LVOT/AV VTI ratio: 0.42  AORTA Ao Root diam: 3.10 cm MITRAL VALVE                TRICUSPID VALVE MV Area (PHT): 2.33 cm     TR Peak grad:   25.4 mmHg MV Area VTI:   1.89 cm     TR Vmax:        252.00 cm/s MV Peak grad:  10.4 mmHg MV Mean grad:  4.0 mmHg     SHUNTS MV Vmax:       1.61 m/s     Systemic VTI:  0.22 m MV Vmean:      97.4 cm/s    Systemic Diam: 2.00 cm MV Decel Time: 325 msec MV E velocity: 102.00 cm/s MV A velocity: 149.00 cm/s MV E/A ratio:  0.68 Julien Nordmann MD Electronically signed by Julien Nordmann MD Signature Date/Time: 12/09/2023/2:44:14 PM    Final    PERIPHERAL VASCULAR  CATHETERIZATION Result Date:  12/08/2023 See surgical note for result.  US Venous Img Lower Bilateral (DVT) Result Date: 12/07/2023 CLINICAL DATA:  Pulmonary embolism. EXAM: BILATERAL LOWER EXTREMITY VENOUS DOPPLER ULTRASOUND TECHNIQUE: Gray-scale sonography with graded compression, as well as color Doppler and duplex ultrasound were performed to evaluate the lower extremity deep venous systems from the level of the common femoral vein and including the common femoral, femoral, profunda femoral, popliteal and calf veins including the posterior tibial, peroneal and gastrocnemius veins when visible. The superficial great saphenous vein was also interrogated. Spectral Doppler was utilized to evaluate flow at rest and with distal augmentation maneuvers in the common femoral, femoral and popliteal veins. COMPARISON:  None Available. FINDINGS: RIGHT LOWER EXTREMITY Common Femoral Vein: No evidence of thrombus. Normal compressibility, respiratory phasicity and response to augmentation. Saphenofemoral Junction: No evidence of thrombus. Normal compressibility and flow on color Doppler imaging. Profunda Femoral Vein: No evidence of thrombus. Normal compressibility and flow on color Doppler imaging. Femoral Vein: No evidence of thrombus. Normal compressibility, respiratory phasicity and response to augmentation. Popliteal Vein: Some flow is present in the proximal popliteal vein. There is occlusive thrombus in the mid to distal segment. Calf Veins: Thrombus in the right peroneal vein. The right posterior tibial vein is patent. Superficial Great Saphenous Vein: No evidence of thrombus. Normal compressibility. Venous Reflux:  None. Other Findings: No evidence of superficial thrombophlebitis or abnormal fluid collection. LEFT LOWER EXTREMITY Common Femoral Vein: No evidence of thrombus. Normal compressibility, respiratory phasicity and response to augmentation. Saphenofemoral Junction: No evidence of thrombus. Normal compressibility and flow on color  Doppler imaging. Profunda Femoral Vein: No evidence of thrombus. Normal compressibility and flow on color Doppler imaging. Femoral Vein: No evidence of thrombus. Normal compressibility, respiratory phasicity and response to augmentation. Popliteal Vein: No evidence of thrombus. Normal compressibility, respiratory phasicity and response to augmentation. Calf Veins: Thrombus in left posterior tibial and peroneal veins. Superficial Great Saphenous Vein: No evidence of thrombus. Normal compressibility. Venous Reflux:  None. Other Findings: No evidence of superficial thrombophlebitis or abnormal fluid collection. IMPRESSION: 1. Occlusive thrombus in the mid to distal right popliteal vein. Thrombus in the right peroneal vein. 2. Thrombus in the left posterior tibial and peroneal veins. Electronically Signed   By: Irish Lack M.D.   On: 12/07/2023 17:08   CT Angio Chest PE W and/or Wo Contrast Result Date: 12/07/2023 CLINICAL DATA:  Syncopal episode, hypoxia and known recent right-sided pulmonary embolism by CT. History of lung carcinoma and colon carcinoma. Prior radiation therapy to the left lower lobe in July. EXAM: CT ANGIOGRAPHY CHEST WITH CONTRAST TECHNIQUE: Multidetector CT imaging of the chest was performed using the standard protocol during bolus administration of intravenous contrast. Multiplanar CT image reconstructions and MIPs were obtained to evaluate the vascular anatomy. RADIATION DOSE REDUCTION: This exam was performed according to the departmental dose-optimization program which includes automated exposure control, adjustment of the mA and/or kV according to patient size and/or use of iterative reconstruction technique. CONTRAST:  75mL OMNIPAQUE IOHEXOL 350 MG/ML SOLN COMPARISON:  CTA of the chest on 11/23/2023 FINDINGS: Cardiovascular: Pulmonary embolism again noted on the right. Thrombus burden is greater than on the prior standard CT likely representing some interval increased thromboembolism  since the prior study with increased thrombus at the bifurcation of the right main pulmonary artery with nonocclusive thrombus extending into upper, middle lobe and lower lobe pulmonary arteries. Most significant thrombus extends into the lower lobe pulmonary artery. No thrombus is identified on the left.  Dilated central pulmonary arteries again present with the main pulmonary artery measuring 3.2 cm. There is likely some degree of right heart strain with estimated RV/LV ratio of roughly 1.45. Stable aneurysmal disease of the ascending thoracic aorta measuring up to approximately 4.1 cm with associated calcified aortic valve and atherosclerosis of the thoracic aorta. The heart size is normal. Heavily calcified mitral valve annulus again noted. No pericardial fluid. Calcified coronary artery plaque present. Mediastinum/Nodes: No enlarged mediastinal, hilar, or axillary lymph nodes. Thyroid gland, trachea, and esophagus demonstrate no significant findings. Lungs/Pleura: Stable consolidative opacities in the posterior left upper lobe, inferior lingula and posterior left lower lobe. Some of these are again likely related to prior radiation therapy. Some degree pneumonia in the left lung cannot be excluded. There is a is stable small amount of left pleural fluid. Small slightly more prominent peripheral opacities in the lateral left upper lobe and posterior left lower lobe may relate to focal pulmonary infarcts and/or atelectasis. No pneumothorax. Upper Abdomen: No acute abnormality. Musculoskeletal: No chest wall abnormality. No acute or significant osseous findings. Review of the MIP images confirms the above findings. IMPRESSION: 1. Increased thrombus burden in the right pulmonary arteries since the prior standard CT of 11/23/2023, likely representing interval increased thromboembolic burden. There is likely some degree of right heart strain with estimated RV/LV ratio of roughly 1.45. Consider correlation with  echocardiography. 2. Stable consolidative opacities in the posterior left upper lobe, inferior lingula and posterior left lower lobe. Some of these are again likely related to prior radiation therapy. Some degree pneumonia in the left lung cannot be excluded. 3. Stable small amount of left pleural fluid. 4. Small slightly more prominent peripheral opacities in the lateral left upper lobe and posterior left lower lobe may relate to focal pulmonary infarcts and/or atelectasis. 5. 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 6. Aortic atherosclerosis. Aortic aneurysm NOS (ICD10-I71.9). Electronically Signed   By: Irish Lack M.D.   On: 12/07/2023 14:59   DG Chest 2 View Result Date: 12/07/2023 CLINICAL DATA:  Suspected sepsis. EXAM: CHEST - 2 VIEW COMPARISON:  CT chest dated November 23, 2023. FINDINGS: The mediastinum remains shifted to the left with similar volume loss and patchy airspace opacification throughout much of the left lung, presumably radiation related. There are a few irregular opacities in the central and peripheral right upper lobe. Unchanged small left pleural effusion. No pneumothorax. No acute osseous abnormality. IMPRESSION: 1. Irregular opacities in the central and peripheral right upper lobe, concerning for pneumonia. 2. Similar volume loss and post radiation change in the left lung. Electronically Signed   By: Obie Dredge M.D.   On: 12/07/2023 14:28   CT CHEST W CONTRAST Result Date: 11/23/2023 CLINICAL DATA:  Follow-up lung cancer. Status post radiation therapy. History of colon cancer also. * Tracking Code: BO * EXAM: CT CHEST WITH CONTRAST TECHNIQUE: Multidetector CT imaging of the chest was performed during intravenous contrast administration. RADIATION DOSE REDUCTION: This exam was performed according to the departmental dose-optimization program which includes  automated exposure control, adjustment of the mA and/or kV according to patient size and/or use of iterative reconstruction technique. CONTRAST:  OMNIPAQUE IOHEXOL 300 MG/ML  SOLN COMPARISON:  Chest CT 03/09/2023 and PET-CT 04/01/2023 FINDINGS: Cardiovascular: The heart is normal in size. No pericardial effusion. Stable tortuosity and ectasia of the thoracic aorta. Stable advanced and progressive atherosclerotic changes along the  aortic arch. Stable three-vessel coronary artery calcifications and stable extensive calcifications around the aortic valve and mitral valve annulus. Significant left-sided pulmonary emboli are noted involving both the right upper lobe and right lower lobe pulmonary arteries and their branches. No definite left-sided pulmonary emboli are identified. Mediastinum/Nodes: Interval enlargement mediastinal hilar lymph nodes. 9 mm right hilar node on image number 66/2. 11 mm left hilar node on image number 49/2. 5 mm prevascular lymph nodes. Lungs/Pleura: Extensive airspace opacification in the left lung and loss of volume possibly related to extensive radiation pneumonitis/fibrosis. No discrete mass is identified. Patchy peripheral airspace opacity in the right lung probably related to the patient's pulmonary emboli with small peripheral pulmonary infarcts. No pleural effusion. Indeterminate 4 mm pulmonary nodule in the right lower lobe on image number 70/4. Attention on future imaging studies is suggested. Upper Abdomen: No significant upper abdominal findings. Diffuse fatty infiltration of the liver is noted. Stable aortic calcifications. No adrenal gland lesions. Musculoskeletal: No significant bony findings. IMPRESSION: 1. Significant right-sided pulmonary emboli involving both the right upper lobe and right lower lobe pulmonary arteries and their branches. No definite left-sided pulmonary emboli are identified. 2. Patchy peripheral airspace opacity in the right lung probably related to  the patient's pulmonary emboli with small peripheral pulmonary infarcts. 3. Extensive airspace opacification in the left lung and loss of volume possibly related to extensive radiation pneumonitis/fibrosis. No discrete mass is identified. 4. Interval enlargement of mediastinal and hilar lymph nodes. 5. Indeterminate 4 mm pulmonary nodule in the right lower lobe. Attention on future imaging studies is suggested. 6. Fatty liver. 7. Aortic atherosclerosis. Aortic Atherosclerosis (ICD10-I70.0). These results will be called to the ordering clinician or representative by the Radiologist Assistant, and communication documented in the PACS or [employer]. Electronically Signed   By: Rudie Meyer M.D.   On: 11/23/2023 14:21   (Echo, Carotid, EGD, Colonoscopy, ERCP)    Subjective: Pt c/o fatigue.    Discharge Exam: Vitals:   12/12/23 0358 12/12/23 0737  BP: (!) 111/56 (!) 105/91  Pulse: 72 74  Resp:  17  Temp: 98.7 F (37.1 C) 98.2 F (36.8 C)  SpO2: 93% 94%   Vitals:   12/11/23 2007 12/11/23 2230 12/12/23 0358 12/12/23 0737  BP: (!) 122/58 131/76 (!) 111/56 (!) 105/91  Pulse: 79 80 72 74  Resp: 18 18  17   Temp: 98.9 F (37.2 C) 98.7 F (37.1 C) 98.7 F (37.1 C) 98.2 F (36.8 C)  TempSrc: Oral Oral Oral   SpO2: 96% 94% 93% 94%  Weight:      Height:        General: Pt is alert, awake, not in acute distress Cardiovascular: S1/S2 +, no rubs, no gallops Respiratory: CTA bilaterally, no wheezing, no rhonchi Abdominal: Soft, NT, ND, bowel sounds + Extremities: no cyanosis    The results of significant diagnostics from this hospitalization (including imaging, microbiology, ancillary and laboratory) are listed below for reference.     Microbiology: Recent Results (from the past 240 hours)  Culture, blood (Routine x 2)     Status: None   Collection Time: 12/07/23 12:24 PM   Specimen: BLOOD  Result Value Ref Range Status   Specimen Description BLOOD RIGHT ANTECUBITAL  Final    Special Requests   Final    BOTTLES DRAWN AEROBIC AND ANAEROBIC Blood Culture adequate volume   Culture   Final    NO GROWTH 5 DAYS Performed at [facility], [address] [place], [place]  [address]    Report Status 12/12/2023 FINAL  Final  MRSA Next Gen by PCR, Nasal     Status: None   Collection Time: 12/07/23  5:45 PM   Specimen: Nasal Mucosa; Nasal Swab  Result Value Ref Range Status   MRSA by PCR Next Gen NOT DETECTED NOT DETECTED Final    Comment: (NOTE) The GeneXpert MRSA Assay (FDA approved for NASAL specimens only), is one component of a comprehensive MRSA colonization surveillance program. It is not intended to diagnose MRSA infection nor to guide or monitor treatment for MRSA infections. Test performance is not FDA approved in patients less than 41 years old. Performed at [facility] [facility], [address] [place], [place] [address]   Urine Culture     Status: Abnormal   Collection Time: 12/08/23  7:21 AM   Specimen: Urine, Random  Result Value Ref Range Status   Specimen Description   Final    URINE, RANDOM Performed at [facility], [address] [place], [place] [address]    Special Requests   Final    URINE, CLEAN CATCH Performed at [facility], [address] [place], [place] [address]    Culture 30,000 COLONIES/mL ENTEROCOCCUS FAECALIS (A)  Final   Report Status 12/10/2023 FINAL  Final   Organism ID, Bacteria ENTEROCOCCUS FAECALIS (A)  Final      Susceptibility   Enterococcus faecalis - MIC*    AMPICILLIN <=2 SENSITIVE Sensitive     NITROFURANTOIN <=16 SENSITIVE Sensitive     VANCOMYCIN 1 SENSITIVE Sensitive     * 30,000 COLONIES/mL ENTEROCOCCUS FAECALIS     Labs: BNP (last 3 results) Recent Labs    07/13/23 0935 10/02/23 1633  BNP 110.0* 243.1*   Basic Metabolic Panel: Recent Labs  Lab 12/08/23 0356 12/09/23 0533 12/10/23 0433 12/11/23 0532 12/12/23 0327  NA 140 141 139 136 139  K 3.5 3.7 3.6 3.5 3.7   CL 107 108 107 103 104  CO2 25 25 24 24 26   GLUCOSE 103* 132* 129* 132* 144*  BUN 9 9 11 9 10   CREATININE 0.59 0.59 0.64 0.60 0.68  CALCIUM 8.3* 8.1* 8.4* 8.3* 8.2*   Liver Function Tests: Recent Labs  Lab 12/07/23 1225  AST 37  ALT 22  ALKPHOS 121  BILITOT 1.6*  PROT 6.5  ALBUMIN 2.6*   No results for input(s): "LIPASE", "AMYLASE" in the last 168 hours. No results for input(s): "AMMONIA" in the last 168 hours. CBC: Recent Labs  Lab 12/07/23 1224 12/08/23 0356 12/09/23 0533 12/10/23 0433 12/11/23 0532 12/12/23 0327  WBC 10.3 7.4 6.3 6.0 5.8 6.5  NEUTROABS 8.8*  --   --   --   --   --   HGB 13.2 10.3* 9.8* 9.7* 9.3* 9.7*  HCT 42.3 33.1* 31.3* 31.8* 30.2* 30.0*  MCV 84.6 87.6 85.3 88.8 85.3 84.5  PLT 222 209 200 197 187 190   Cardiac Enzymes: No results for input(s): "CKTOTAL", "CKMB", "CKMBINDEX", "TROPONINI" in the last 168 hours. BNP: Invalid input(s): "POCBNP" CBG: Recent Labs  Lab 12/10/23 2037 12/11/23 0832 12/11/23 1714 12/11/23 2148 12/12/23 0737  GLUCAP 114* 122* 259* 144* 133*   D-Dimer No results for input(s): "DDIMER" in the last 72 hours. Hgb A1c No results for input(s): "HGBA1C" in the last 72 hours. Lipid Profile No results for input(s): "CHOL", "HDL", "LDLCALC", "TRIG", "CHOLHDL", "LDLDIRECT" in the last 72 hours. Thyroid function studies No results for input(s): "TSH", "T4TOTAL", "T3FREE", "THYROIDAB" in the last  72 hours.  Invalid input(s): "FREET3" Anemia work up No results for input(s): "VITAMINB12", "FOLATE", "FERRITIN", "TIBC", "IRON", "RETICCTPCT" in the last 72 hours. Urinalysis    Component Value Date/Time   COLORURINE YELLOW (A) 12/08/2023 0721   APPEARANCEUR CLOUDY (A) 12/08/2023 0721   APPEARANCEUR Clear 06/13/2021 1019   LABSPEC >1.046 (H) 12/08/2023 0721   LABSPEC 1.010 12/15/2014 1926   PHURINE 5.0 12/08/2023 0721   GLUCOSEU NEGATIVE 12/08/2023 0721   GLUCOSEU NEGATIVE 12/15/2014 1926   HGBUR NEGATIVE 12/08/2023  0721   BILIRUBINUR NEGATIVE 12/08/2023 0721   BILIRUBINUR Negative 06/13/2021 1019   BILIRUBINUR NEGATIVE 12/15/2014 1926   KETONESUR NEGATIVE 12/08/2023 0721   PROTEINUR 30 (A) 12/08/2023 0721   NITRITE NEGATIVE 12/08/2023 0721   LEUKOCYTESUR LARGE (A) 12/08/2023 0721   LEUKOCYTESUR NEGATIVE 12/15/2014 1926   Sepsis Labs Recent Labs  Lab 12/09/23 0533 12/10/23 0433 12/11/23 0532 12/12/23 0327  WBC 6.3 6.0 5.8 6.5   Microbiology Recent Results (from the past 240 hours)  Culture, blood (Routine x 2)     Status: None   Collection Time: 12/07/23 12:24 PM   Specimen: BLOOD  Result Value Ref Range Status   Specimen Description BLOOD RIGHT ANTECUBITAL  Final   Special Requests   Final    BOTTLES DRAWN AEROBIC AND ANAEROBIC Blood Culture adequate volume   Culture   Final    NO GROWTH 5 DAYS Performed at [facility], [address], [place], [place] [address]    Report Status 12/12/2023 FINAL  Final  MRSA Next Gen by PCR, Nasal     Status: None   Collection Time: 12/07/23  5:45 PM   Specimen: Nasal Mucosa; Nasal Swab  Result Value Ref Range Status   MRSA by PCR Next Gen NOT DETECTED NOT DETECTED Final    Comment: (NOTE) The GeneXpert MRSA Assay (FDA approved for NASAL specimens only), is one component of a comprehensive MRSA colonization surveillance program. It is not intended to diagnose MRSA infection nor to guide or monitor treatment for MRSA infections. Test performance is not FDA approved in patients less than 58 years old. Performed at [facility], [address], [place], [place] [address]   Urine Culture     Status: Abnormal   Collection Time: 12/08/23  7:21 AM   Specimen: Urine, Random  Result Value Ref Range Status   Specimen Description   Final    URINE, RANDOM Performed at [facility], [address], [place], [place] [address]    Special Requests   Final    URINE, CLEAN CATCH Performed at [facility], [address] [place], [place] [address]    Culture 30,000 COLONIES/mL ENTEROCOCCUS FAECALIS (A)  Final   Report Status 12/10/2023 FINAL  Final   Organism ID, Bacteria ENTEROCOCCUS FAECALIS (A)  Final      Susceptibility   Enterococcus faecalis - MIC*    AMPICILLIN <=2 SENSITIVE Sensitive     NITROFURANTOIN <=16 SENSITIVE Sensitive     VANCOMYCIN 1 SENSITIVE Sensitive     * 30,000 COLONIES/mL ENTEROCOCCUS FAECALIS     Time coordinating discharge: Over 30 minutes  SIGNED:   Charise Killian, MD  [facility] 12/12/2023, 9:25 AM Pager   If 7PM-7AM, please contact night-coverage [email or web address]

## 2023-12-12 NOTE — TOC Transition Note (Signed)
Transition of Care [facility]) - Discharge Note   Patient Details  Name: [name] MRN: [id number] Date of Birth: [birth year]  Transition of Care [facility]) CM/SW Contact:  Luvenia Redden, RN Phone Number: 12/12/2023, 9:42 AM   Clinical Narrative:    TOC RNCM contacted [facility] Laurelyn Sickle) to alert pt will discharged today home and resume HHealth services. TOC liaison attempted to update the pt and daughter however unsuccessful and unable to leave a HIPAA message. Again services has been confirmed to resume with [facility].  TOC available for any additional needs.      Barriers to Discharge: Barriers Resolved   Patient Goals and CMS Choice Patient states their goals for this hospitalization and ongoing recovery are:: home CMS Medicare.gov Compare Post Acute Care list provided to:: Patient Represenative (must comment)        Discharge Placement                       Discharge Plan and Services Additional resources added to the After Visit Summary for       Post Acute Care Choice: Home Health                    HH Arranged: PT, OT [facility] Agency: [facility] Date [facility] Agency Contacted: 12/10/23 Time HH Agency Contacted: 1624 Representative spoke with at [facility] Agency: [place]  Social Drivers of Health (SDOH) Interventions SDOH Screenings   Food Insecurity: No Food Insecurity (12/08/2023)  Housing: Low Risk  (12/08/2023)  Transportation Needs: No Transportation Needs (12/08/2023)  Utilities: Not At Risk (12/08/2023)  Alcohol Screen: Low Risk  (05/09/2020)  Depression (PHQ2-9): High Risk (11/25/2023)  Financial Resource Strain: Low Risk  (05/09/2020)  Physical Activity: Inactive (05/09/2020)  Social Connections: Moderately Isolated (12/08/2023)  Stress: No Stress Concern Present (05/09/2020)  Tobacco Use: Medium Risk (12/07/2023)     Readmission Risk Interventions    12/10/2023    4:22 PM  Readmission Risk Prevention Plan  Transportation Screening Complete   PCP or Specialist Appt within 3-5 Days Complete  HRI or Home Care Consult Complete  Palliative Care Screening Not Applicable  Medication Review (RN Care Manager) Complete

## 2023-12-12 NOTE — Plan of Care (Signed)

## 2023-12-13 ENCOUNTER — Telehealth: Payer: Self-pay | Admitting: Family Medicine

## 2023-12-13 ENCOUNTER — Telehealth: Payer: Self-pay

## 2023-12-13 ENCOUNTER — Encounter: Payer: Self-pay | Admitting: Oncology

## 2023-12-13 DIAGNOSIS — Z95828 Presence of other vascular implants and grafts: Secondary | ICD-10-CM | POA: Diagnosis not present

## 2023-12-13 DIAGNOSIS — K5730 Diverticulosis of large intestine without perforation or abscess without bleeding: Secondary | ICD-10-CM | POA: Diagnosis not present

## 2023-12-13 DIAGNOSIS — I700 Atherosclerosis of aorta: Secondary | ICD-10-CM | POA: Diagnosis not present

## 2023-12-13 DIAGNOSIS — E1159 Type 2 diabetes mellitus with other circulatory complications: Secondary | ICD-10-CM | POA: Diagnosis not present

## 2023-12-13 DIAGNOSIS — G4700 Insomnia, unspecified: Secondary | ICD-10-CM | POA: Diagnosis not present

## 2023-12-13 DIAGNOSIS — R32 Unspecified urinary incontinence: Secondary | ICD-10-CM | POA: Diagnosis not present

## 2023-12-13 DIAGNOSIS — K580 Irritable bowel syndrome with diarrhea: Secondary | ICD-10-CM | POA: Diagnosis not present

## 2023-12-13 DIAGNOSIS — F339 Major depressive disorder, recurrent, unspecified: Secondary | ICD-10-CM | POA: Diagnosis not present

## 2023-12-13 DIAGNOSIS — Z7901 Long term (current) use of anticoagulants: Secondary | ICD-10-CM | POA: Diagnosis not present

## 2023-12-13 DIAGNOSIS — E785 Hyperlipidemia, unspecified: Secondary | ICD-10-CM | POA: Diagnosis not present

## 2023-12-13 DIAGNOSIS — R627 Adult failure to thrive: Secondary | ICD-10-CM | POA: Diagnosis not present

## 2023-12-13 DIAGNOSIS — I5030 Unspecified diastolic (congestive) heart failure: Secondary | ICD-10-CM | POA: Diagnosis not present

## 2023-12-13 DIAGNOSIS — Z85118 Personal history of other malignant neoplasm of bronchus and lung: Secondary | ICD-10-CM | POA: Diagnosis not present

## 2023-12-13 DIAGNOSIS — Z7984 Long term (current) use of oral hypoglycemic drugs: Secondary | ICD-10-CM | POA: Diagnosis not present

## 2023-12-13 DIAGNOSIS — I152 Hypertension secondary to endocrine disorders: Secondary | ICD-10-CM | POA: Diagnosis not present

## 2023-12-13 DIAGNOSIS — K922 Gastrointestinal hemorrhage, unspecified: Secondary | ICD-10-CM | POA: Diagnosis not present

## 2023-12-13 DIAGNOSIS — K219 Gastro-esophageal reflux disease without esophagitis: Secondary | ICD-10-CM | POA: Diagnosis not present

## 2023-12-13 DIAGNOSIS — D649 Anemia, unspecified: Secondary | ICD-10-CM | POA: Diagnosis not present

## 2023-12-13 DIAGNOSIS — J189 Pneumonia, unspecified organism: Secondary | ICD-10-CM | POA: Diagnosis not present

## 2023-12-13 DIAGNOSIS — I471 Supraventricular tachycardia, unspecified: Secondary | ICD-10-CM | POA: Diagnosis not present

## 2023-12-13 DIAGNOSIS — Z8673 Personal history of transient ischemic attack (TIA), and cerebral infarction without residual deficits: Secondary | ICD-10-CM | POA: Diagnosis not present

## 2023-12-13 DIAGNOSIS — Z48812 Encounter for surgical aftercare following surgery on the circulatory system: Secondary | ICD-10-CM | POA: Diagnosis not present

## 2023-12-13 DIAGNOSIS — I350 Nonrheumatic aortic (valve) stenosis: Secondary | ICD-10-CM | POA: Diagnosis not present

## 2023-12-13 DIAGNOSIS — N3281 Overactive bladder: Secondary | ICD-10-CM | POA: Diagnosis not present

## 2023-12-13 LAB — GLUCOSE, CAPILLARY
Glucose-Capillary: 175 mg/dL — ABNORMAL HIGH (ref 70–99)
Glucose-Capillary: 170 mg/dL — ABNORMAL HIGH (ref 70–99)

## 2023-12-13 NOTE — Transitions of Care (Post Inpatient/ED Visit) (Signed)
12/13/2023  Name: [name] MRN: [id number] DOB: Nov 07, 1929  Today's TOC FU Call Status: Today's TOC FU Call Status:: Successful TOC FU Call Completed TOC FU Call Complete Date: 12/13/23 Patient's Name and Date of Birth confirmed.  Transition Care Management Follow-up Telephone Call Discharge Facility: [facility]) Type of Discharge: Inpatient Admission Primary Inpatient Discharge Diagnosis:: Pulmonary embolism How have you been since you were released from the hospital?: Better Any questions or concerns?: No  Items Reviewed: Did you receive and understand the discharge instructions provided?: Yes Medications obtained,verified, and reconciled?: Yes (Medications Reviewed) Any new allergies since your discharge?: No Dietary orders reviewed?: Yes Type of Diet Ordered:: Reg Heart Healthy Do you have support at home?: Yes People in Home: child(ren), adult Name of Support/Comfort Primary Source: [name] ( Daughter) and Santina Evans( DIL)  Medications Reviewed Today: Medications Reviewed Today     Reviewed by Johnnette Barrios, RN (Registered Nurse) on 12/13/23 at 1630  Med List Status: <None>   Medication Order Taking? Sig Documenting Provider Last Dose Status Informant  apixaban (ELIQUIS) 2.5 MG TABS tablet [id number] Yes Take 1 tablet (2.5 mg total) by mouth 2 (two) times daily. Charise Killian, MD Taking Active   atorvastatin (LIPITOR) 40 MG tablet [id number] Yes Take 1 tablet (40 mg total) by mouth daily. Jacky Kindle, FNP Taking Active Pharmacy Records  cholecalciferol (VITAMIN D) 1000 UNITS tablet [id number] Yes Take 1,000 Units by mouth daily.  [provider] Taking Active Other           Med Note Aundria Rud, Landry Dyke   Tue Jun 22, 2023 10:37 AM)    glipiZIDE (GLUCOTROL XL) 5 MG 24 hr tablet [id number]  Take 1 tablet (5 mg total) by mouth daily with breakfast. Jacky Kindle, FNP  Active Pharmacy Records  nystatin (MYCOSTATIN) 100000 UNIT/ML  suspension [id number] No Take 5 mLs (500,000 Units total) by mouth 4 (four) times daily for 14 days. Shake well before use.  Measure liquid doses carefully to 5mL  Swish it in your mouth as long as you can before swallowing.  Patient not taking: Reported on 12/13/2023   Sallee Provencal, FNP Not Taking Active   pantoprazole (PROTONIX) 40 MG tablet [id number] Yes Take 1 tablet (40 mg total) by mouth daily. Charise Killian, MD Taking Active   PARoxetine (PAXIL) 10 MG tablet [id number] Yes Take 1 tablet (10 mg total) by mouth daily. Jacky Kindle, FNP Taking Active   Polyethylene Glycol 3350 (MIRALAX PO) [id number] Yes Take by mouth. [provider] Taking Active   simethicone (MYLICON) 125 MG chewable tablet [id number] Yes Chew 1 tablet (125 mg total) by mouth every 6 (six) hours as needed for flatulence. Jacky Kindle, FNP Taking Active   temazepam (RESTORIL) 15 MG capsule [id number] Yes TAKE 1 CAPSULE BY MOUTH AT BEDTIME AS NEEDED FOR SLEEP Jacky Kindle, FNP Taking Active Pharmacy Records          Medication reconciliation / review completed based on most recent discharge summary and EHR medication list. Confirmed patient is taking all newly prescribed medications as instructed (any discrepancies are noted in review section)   Patient / Caregiver is aware of any changes to and / or  any dosage adjustments to medication regimen. Patient/ Caregiver denies questions at this time and reports no barriers to medication adherence.   Home Care and Equipment/Supplies: Were Home Health Services Ordered?: Yes Name of Home Health Agency:: [facility] Has Agency set up  a time to come to your home?: Yes First Home Health Visit Date: 12/13/23 Any new equipment or medical supplies ordered?: No  Functional Questionnaire: Do you need assistance with bathing/showering or dressing?: No Do you need assistance with meal preparation?: Yes (Family prepares) Do you need assistance with eating?: No Do you  have difficulty maintaining continence: No Do you need assistance with getting out of bed/getting out of a chair/moving?: Yes ([facility] HH PT ordered 1st visit 1/28) Do you have difficulty managing or taking your medications?: Yes (Family manages and set up)  Follow up appointments reviewed: PCP Follow-up appointment confirmed?: NA (DIL discussed with PCP deferred to hematology) MD Provider Line Number:[phone] Given: No Specialist Hospital Follow-up appointment confirmed?: Yes Date of Specialist follow-up appointment?: 12/22/23 Follow-Up Specialty Provider:: Tollie Pizza [facility]  Wednesday Dec 22, 2023 10:15 AM  [facility] Do you need transportation to your follow-up appointment?: No (Family will transport) Do you understand care options if your condition(s) worsen?: Yes-patient verbalized understanding  SDOH Interventions Today    Flowsheet Row Most Recent Value  SDOH Interventions   Food Insecurity Interventions Intervention Not Indicated  Housing Interventions Intervention Not Indicated  Transportation Interventions Intervention Not Indicated, Patient Resources (Friends/Family)  Utilities Interventions Intervention Not Indicated  Social Connections Interventions Intervention Not Indicated      Benefits reviewed  Based on current information and Insurance plan -Reviewed benefits accessible to patient, including details about eligibility options for care and  available value based care options  if any areas of needs were identified.  Reviewed patient/  caregiver's ability to access and / or  ability with navigating the benefits system..Amb Referral made if indicted , refer to orders section of note for details   Reviewed goals for care  Patient / Caregiver was encouraged to make informed decisions about their care, actively participate in managing their health condition, and implement lifestyle changes as needed to promote independence and self-management of health care.  There were no reported  barriers to care.   TOC program  Patient is at high risk for readmission and/or has history of  high utilization  Discussed VBCI  TOC program and weekly calls to patient to assess condition/status, medication management  and provide support/education as indicated . Patient/ Caregiver voiced understanding and declined enrollment in the 30-day TOC Program.    They were seen and will be followed by [facility] PT and OT  She has 24/7 oversight with family. She is obtaining a Life alert bracelet. She has scheduled follow-up appointments and PCP is aware of recent hospital course Will defer to [facility] for CM to avoid duplication in services    The patient has been provided with contact information for the care management team and has been advised to call with any health-related questions or concerns. Follow up as indicated with Care Team , or sooner should any new problems arise.  Susa Loffler , BSN, RN [facility]   VBCI-Population Health RN Care Manager Direct Dial [phone]  Fax: [phone] Website: [email or web address]

## 2023-12-13 NOTE — Transitions of Care (Post Inpatient/ED Visit) (Signed)
12/13/2023  Name: [name] MRN: [id number] DOB: [birth year]  Today's TOC FU Call Status: Today's TOC FU Call Status:: Successful TOC FU Call Completed TOC FU Call Complete Date: 12/13/23 Patient's Name and Date of Birth confirmed.  Transition Care Management Follow-up Telephone Call Date of Discharge: 12/12/23 Discharge Facility: [facility]) Type of Discharge: Inpatient Admission Primary Inpatient Discharge Diagnosis:: PE How have you been since you were released from the hospital?: Better Any questions or concerns?: No  Items Reviewed: Did you receive and understand the discharge instructions provided?: Yes Medications obtained,verified, and reconciled?: Yes (Medications Reviewed) Any new allergies since your discharge?: No Dietary orders reviewed?: Yes Do you have support at home?: Yes People in Home: child(ren), adult  Medications Reviewed Today: Medications Reviewed Today     Reviewed by Karena Addison, LPN (Licensed Practical Nurse) on 12/13/23 at 1124  Med List Status: <None>   Medication Order Taking? Sig Documenting Provider Last Dose Status Informant  apixaban (ELIQUIS) 2.5 MG TABS tablet [id number]  Take 1 tablet (2.5 mg total) by mouth 2 (two) times daily. Charise Killian, MD  Active   atorvastatin (LIPITOR) 40 MG tablet [id number] No Take 1 tablet (40 mg total) by mouth daily. Jacky Kindle, FNP 12/06/2023 Active Pharmacy Records  cholecalciferol (VITAMIN D) 1000 UNITS tablet [id number] No Take 1,000 Units by mouth daily.  [provider] 12/06/2023 Active Other           Med Note Aundria Rud, Tresa Endo S   Tue Jun 22, 2023 10:37 AM)    glipiZIDE (GLUCOTROL XL) 5 MG 24 hr tablet [id number] No Take 1 tablet (5 mg total) by mouth daily with breakfast. Jacky Kindle, FNP 12/06/2023 Active Pharmacy Records  nystatin (MYCOSTATIN) 100000 UNIT/ML suspension [id number] No Take 5 mLs (500,000 Units total) by mouth 4 (four) times daily for  14 days. Shake well before use.  Measure liquid doses carefully to 5mL  Swish it in your mouth as long as you can before swallowing. Sallee Provencal, FNP 12/06/2023 Active   pantoprazole (PROTONIX) 40 MG tablet [id number]  Take 1 tablet (40 mg total) by mouth daily. Charise Killian, MD  Active   PARoxetine (PAXIL) 10 MG tablet [id number] No Take 1 tablet (10 mg total) by mouth daily.  Patient not taking: Reported on 11/25/2023   Jacky Kindle, FNP Not Taking Active   Polyethylene Glycol 3350 (MIRALAX PO) [id number] No Take by mouth. [provider] Past Week Active   simethicone (MYLICON) 125 MG chewable tablet [id number] No Chew 1 tablet (125 mg total) by mouth every 6 (six) hours as needed for flatulence. Jacky Kindle, FNP Past Week Active   temazepam (RESTORIL) 15 MG capsule [id number] No TAKE 1 CAPSULE BY MOUTH AT BEDTIME AS NEEDED FOR SLEEP Jacky Kindle, FNP 12/06/2023 Active Pharmacy Records            Home Care and Equipment/Supplies: Were Home Health Services Ordered?: Yes Name of Home Health Agency:: [facility] Has Agency set up a time to come to your home?: No EMR reviewed for Home Health Orders: Orders present/patient has not received call (refer to CM for follow-up) Any new equipment or medical supplies ordered?: NA  Functional Questionnaire: Do you need assistance with bathing/showering or dressing?: Yes Do you need assistance with meal preparation?: Yes Do you need assistance with eating?: No Do you have difficulty maintaining continence: No Do you need assistance with getting out of bed/getting out of a chair/moving?:  Yes Do you have difficulty managing or taking your medications?: Yes  Follow up appointments reviewed: PCP Follow-up appointment confirmed?: No (declined) MD Provider Line Number:[phone] Given: No Specialist Hospital Follow-up appointment confirmed?: Yes Date of Specialist follow-up appointment?: 12/22/23 Follow-Up Specialty Provider::  onco Do you need transportation to your follow-up appointment?: No Do you understand care options if your condition(s) worsen?: Yes-patient verbalized understanding    SIGNATURE Karena Addison, LPN [facility] Nurse Health Advisor Direct Dial [phone]

## 2023-12-13 NOTE — Telephone Encounter (Signed)
Home Health Verbal Orders - Caller/Agency: Alverda Skeans RN  Callback Number: [phone] Service Requested: Skilled Nursing Frequency: 1w3 every other week x 4  for disease mgmt teaching and medication mgmt teaching  Any new concerns about the patient? No

## 2023-12-14 ENCOUNTER — Encounter: Payer: Self-pay | Admitting: Oncology

## 2023-12-14 ENCOUNTER — Telehealth: Payer: Self-pay | Admitting: Family Medicine

## 2023-12-14 ENCOUNTER — Ambulatory Visit: Admission: RE | Admit: 2023-12-14 | Payer: Medicare Other | Source: Home / Self Care | Admitting: Vascular Surgery

## 2023-12-14 ENCOUNTER — Encounter: Admission: RE | Payer: Self-pay | Source: Home / Self Care

## 2023-12-14 DIAGNOSIS — J189 Pneumonia, unspecified organism: Secondary | ICD-10-CM | POA: Diagnosis not present

## 2023-12-14 DIAGNOSIS — I350 Nonrheumatic aortic (valve) stenosis: Secondary | ICD-10-CM | POA: Diagnosis not present

## 2023-12-14 DIAGNOSIS — K5730 Diverticulosis of large intestine without perforation or abscess without bleeding: Secondary | ICD-10-CM | POA: Diagnosis not present

## 2023-12-14 DIAGNOSIS — I5030 Unspecified diastolic (congestive) heart failure: Secondary | ICD-10-CM | POA: Diagnosis not present

## 2023-12-14 DIAGNOSIS — K580 Irritable bowel syndrome with diarrhea: Secondary | ICD-10-CM | POA: Diagnosis not present

## 2023-12-14 DIAGNOSIS — D649 Anemia, unspecified: Secondary | ICD-10-CM | POA: Diagnosis not present

## 2023-12-14 DIAGNOSIS — K219 Gastro-esophageal reflux disease without esophagitis: Secondary | ICD-10-CM | POA: Diagnosis not present

## 2023-12-14 DIAGNOSIS — E785 Hyperlipidemia, unspecified: Secondary | ICD-10-CM | POA: Diagnosis not present

## 2023-12-14 DIAGNOSIS — F339 Major depressive disorder, recurrent, unspecified: Secondary | ICD-10-CM | POA: Diagnosis not present

## 2023-12-14 DIAGNOSIS — I152 Hypertension secondary to endocrine disorders: Secondary | ICD-10-CM | POA: Diagnosis not present

## 2023-12-14 DIAGNOSIS — K922 Gastrointestinal hemorrhage, unspecified: Secondary | ICD-10-CM | POA: Diagnosis not present

## 2023-12-14 DIAGNOSIS — I471 Supraventricular tachycardia, unspecified: Secondary | ICD-10-CM | POA: Diagnosis not present

## 2023-12-14 DIAGNOSIS — G4700 Insomnia, unspecified: Secondary | ICD-10-CM | POA: Diagnosis not present

## 2023-12-14 DIAGNOSIS — I700 Atherosclerosis of aorta: Secondary | ICD-10-CM | POA: Diagnosis not present

## 2023-12-14 DIAGNOSIS — Z48812 Encounter for surgical aftercare following surgery on the circulatory system: Secondary | ICD-10-CM | POA: Diagnosis not present

## 2023-12-14 DIAGNOSIS — E1159 Type 2 diabetes mellitus with other circulatory complications: Secondary | ICD-10-CM | POA: Diagnosis not present

## 2023-12-14 DIAGNOSIS — I2699 Other pulmonary embolism without acute cor pulmonale: Secondary | ICD-10-CM

## 2023-12-14 SURGERY — IVC FILTER INSERTION
Anesthesia: Moderate Sedation

## 2023-12-14 NOTE — Telephone Encounter (Signed)
Home Health Verbal Orders - Caller/Agency: Dorris Singh - Physical Therapist [facility]  Callback Number: [phone] Service Requested: Physical Therapy Frequency: 1w9 Any new concerns about the patient? No

## 2023-12-15 ENCOUNTER — Inpatient Hospital Stay: Payer: Medicare Other

## 2023-12-15 ENCOUNTER — Other Ambulatory Visit: Payer: Self-pay | Admitting: *Deleted

## 2023-12-15 ENCOUNTER — Encounter: Payer: Self-pay | Admitting: *Deleted

## 2023-12-15 DIAGNOSIS — K5791 Diverticulosis of intestine, part unspecified, without perforation or abscess with bleeding: Secondary | ICD-10-CM | POA: Diagnosis not present

## 2023-12-15 DIAGNOSIS — D509 Iron deficiency anemia, unspecified: Secondary | ICD-10-CM

## 2023-12-15 DIAGNOSIS — Z634 Disappearance and death of family member: Secondary | ICD-10-CM | POA: Diagnosis not present

## 2023-12-15 DIAGNOSIS — Z923 Personal history of irradiation: Secondary | ICD-10-CM | POA: Diagnosis not present

## 2023-12-15 DIAGNOSIS — R911 Solitary pulmonary nodule: Secondary | ICD-10-CM | POA: Diagnosis not present

## 2023-12-15 DIAGNOSIS — Z79899 Other long term (current) drug therapy: Secondary | ICD-10-CM | POA: Diagnosis not present

## 2023-12-15 DIAGNOSIS — G4700 Insomnia, unspecified: Secondary | ICD-10-CM | POA: Diagnosis not present

## 2023-12-15 DIAGNOSIS — Z85038 Personal history of other malignant neoplasm of large intestine: Secondary | ICD-10-CM | POA: Diagnosis not present

## 2023-12-15 DIAGNOSIS — I2699 Other pulmonary embolism without acute cor pulmonale: Secondary | ICD-10-CM | POA: Diagnosis not present

## 2023-12-15 DIAGNOSIS — Z87891 Personal history of nicotine dependence: Secondary | ICD-10-CM | POA: Diagnosis not present

## 2023-12-15 DIAGNOSIS — F419 Anxiety disorder, unspecified: Secondary | ICD-10-CM | POA: Diagnosis not present

## 2023-12-15 DIAGNOSIS — D500 Iron deficiency anemia secondary to blood loss (chronic): Secondary | ICD-10-CM | POA: Diagnosis not present

## 2023-12-15 LAB — CBC WITH DIFFERENTIAL/PLATELET
Abs Immature Granulocytes: 0.03 10*3/uL (ref 0.00–0.07)
Basophils Absolute: 0 10*3/uL (ref 0.0–0.1)
Basophils Relative: 1 %
Eosinophils Absolute: 0.2 10*3/uL (ref 0.0–0.5)
Eosinophils Relative: 3 %
HCT: 36.3 % (ref 36.0–46.0)
Hemoglobin: 11.1 g/dL — ABNORMAL LOW (ref 12.0–15.0)
Immature Granulocytes: 0 %
Lymphocytes Relative: 20 %
Lymphs Abs: 1.6 10*3/uL (ref 0.7–4.0)
MCH: 26.5 pg (ref 26.0–34.0)
MCHC: 30.6 g/dL (ref 30.0–36.0)
MCV: 86.6 fL (ref 80.0–100.0)
Monocytes Absolute: 0.6 10*3/uL (ref 0.1–1.0)
Monocytes Relative: 8 %
Neutro Abs: 5.5 10*3/uL (ref 1.7–7.7)
Neutrophils Relative %: 68 %
Platelets: 279 10*3/uL (ref 150–400)
RBC: 4.19 MIL/uL (ref 3.87–5.11)
RDW: 17.9 % — ABNORMAL HIGH (ref 11.5–15.5)
WBC: 8 10*3/uL (ref 4.0–10.5)
nRBC: 0 % (ref 0.0–0.2)

## 2023-12-15 LAB — IRON AND TIBC
Iron: 33 ug/dL (ref 28–170)
Saturation Ratios: 14 % (ref 10.4–31.8)
TIBC: 232 ug/dL — ABNORMAL LOW (ref 250–450)
UIBC: 199 ug/dL

## 2023-12-15 LAB — SAMPLE TO BLOOD BANK

## 2023-12-15 LAB — FERRITIN: Ferritin: 330 ng/mL — ABNORMAL HIGH (ref 11–307)

## 2023-12-16 ENCOUNTER — Telehealth: Payer: Self-pay | Admitting: Family Medicine

## 2023-12-16 DIAGNOSIS — K922 Gastrointestinal hemorrhage, unspecified: Secondary | ICD-10-CM | POA: Diagnosis not present

## 2023-12-16 DIAGNOSIS — E785 Hyperlipidemia, unspecified: Secondary | ICD-10-CM | POA: Diagnosis not present

## 2023-12-16 DIAGNOSIS — I350 Nonrheumatic aortic (valve) stenosis: Secondary | ICD-10-CM | POA: Diagnosis not present

## 2023-12-16 DIAGNOSIS — K580 Irritable bowel syndrome with diarrhea: Secondary | ICD-10-CM | POA: Diagnosis not present

## 2023-12-16 DIAGNOSIS — D649 Anemia, unspecified: Secondary | ICD-10-CM | POA: Diagnosis not present

## 2023-12-16 DIAGNOSIS — J189 Pneumonia, unspecified organism: Secondary | ICD-10-CM | POA: Diagnosis not present

## 2023-12-16 DIAGNOSIS — K219 Gastro-esophageal reflux disease without esophagitis: Secondary | ICD-10-CM | POA: Diagnosis not present

## 2023-12-16 DIAGNOSIS — G4700 Insomnia, unspecified: Secondary | ICD-10-CM | POA: Diagnosis not present

## 2023-12-16 DIAGNOSIS — I152 Hypertension secondary to endocrine disorders: Secondary | ICD-10-CM | POA: Diagnosis not present

## 2023-12-16 DIAGNOSIS — I700 Atherosclerosis of aorta: Secondary | ICD-10-CM | POA: Diagnosis not present

## 2023-12-16 DIAGNOSIS — K5730 Diverticulosis of large intestine without perforation or abscess without bleeding: Secondary | ICD-10-CM | POA: Diagnosis not present

## 2023-12-16 DIAGNOSIS — I471 Supraventricular tachycardia, unspecified: Secondary | ICD-10-CM | POA: Diagnosis not present

## 2023-12-16 DIAGNOSIS — I5030 Unspecified diastolic (congestive) heart failure: Secondary | ICD-10-CM | POA: Diagnosis not present

## 2023-12-16 DIAGNOSIS — F339 Major depressive disorder, recurrent, unspecified: Secondary | ICD-10-CM | POA: Diagnosis not present

## 2023-12-16 DIAGNOSIS — E1159 Type 2 diabetes mellitus with other circulatory complications: Secondary | ICD-10-CM | POA: Diagnosis not present

## 2023-12-16 DIAGNOSIS — Z48812 Encounter for surgical aftercare following surgery on the circulatory system: Secondary | ICD-10-CM | POA: Diagnosis not present

## 2023-12-16 NOTE — Telephone Encounter (Signed)
Marylene Land, RN - [facility], calling to follow up on verbal orders. Requesting call back

## 2023-12-16 NOTE — Telephone Encounter (Signed)
Home Health Verbal Orders - Caller/Agency: Junious Dresser Occupational Therapy [facility]  Callback Number: [phone] Service Requested: Occupational Therapy Frequency: 1w8  Any new concerns about the patient? No

## 2023-12-20 NOTE — Telephone Encounter (Signed)
Aniceto Boss RN with [facility] is calling to see if the verbal orders have been approved for skilled nursing for pt.  Advised to Marylene Land that verbal orders have been approved.

## 2023-12-22 ENCOUNTER — Other Ambulatory Visit: Payer: Self-pay

## 2023-12-22 ENCOUNTER — Inpatient Hospital Stay: Payer: Medicare Other

## 2023-12-22 ENCOUNTER — Encounter: Payer: Self-pay | Admitting: Oncology

## 2023-12-22 ENCOUNTER — Inpatient Hospital Stay: Payer: Medicare Other | Attending: Oncology | Admitting: Oncology

## 2023-12-22 ENCOUNTER — Emergency Department: Payer: Medicare Other

## 2023-12-22 ENCOUNTER — Inpatient Hospital Stay
Admission: EM | Admit: 2023-12-22 | Discharge: 2023-12-28 | DRG: 377 | Disposition: A | Discharge status: Home Health | Payer: Medicare Other | Source: Ambulatory Visit | Attending: Osteopathic Medicine | Admitting: Osteopathic Medicine

## 2023-12-22 VITALS — BP 141/66 | HR 93 | Temp 96.1°F | Resp 16 | Ht 62.0 in | Wt 114.0 lb

## 2023-12-22 DIAGNOSIS — I77810 Thoracic aortic ectasia: Secondary | ICD-10-CM | POA: Diagnosis present

## 2023-12-22 DIAGNOSIS — E1165 Type 2 diabetes mellitus with hyperglycemia: Secondary | ICD-10-CM | POA: Diagnosis present

## 2023-12-22 DIAGNOSIS — G4700 Insomnia, unspecified: Secondary | ICD-10-CM | POA: Diagnosis present

## 2023-12-22 DIAGNOSIS — K219 Gastro-esophageal reflux disease without esophagitis: Secondary | ICD-10-CM | POA: Diagnosis present

## 2023-12-22 DIAGNOSIS — Z8249 Family history of ischemic heart disease and other diseases of the circulatory system: Secondary | ICD-10-CM | POA: Diagnosis not present

## 2023-12-22 DIAGNOSIS — K922 Gastrointestinal hemorrhage, unspecified: Secondary | ICD-10-CM | POA: Diagnosis not present

## 2023-12-22 DIAGNOSIS — Z885 Allergy status to narcotic agent status: Secondary | ICD-10-CM

## 2023-12-22 DIAGNOSIS — D62 Acute posthemorrhagic anemia: Secondary | ICD-10-CM | POA: Diagnosis not present

## 2023-12-22 DIAGNOSIS — D509 Iron deficiency anemia, unspecified: Secondary | ICD-10-CM | POA: Diagnosis not present

## 2023-12-22 DIAGNOSIS — Z880 Allergy status to penicillin: Secondary | ICD-10-CM | POA: Diagnosis not present

## 2023-12-22 DIAGNOSIS — K580 Irritable bowel syndrome with diarrhea: Secondary | ICD-10-CM | POA: Diagnosis present

## 2023-12-22 DIAGNOSIS — Z9049 Acquired absence of other specified parts of digestive tract: Secondary | ICD-10-CM

## 2023-12-22 DIAGNOSIS — Z85038 Personal history of other malignant neoplasm of large intestine: Secondary | ICD-10-CM | POA: Insufficient documentation

## 2023-12-22 DIAGNOSIS — I2699 Other pulmonary embolism without acute cor pulmonale: Secondary | ICD-10-CM | POA: Diagnosis present

## 2023-12-22 DIAGNOSIS — R000 Tachycardia, unspecified: Secondary | ICD-10-CM | POA: Diagnosis not present

## 2023-12-22 DIAGNOSIS — Z85118 Personal history of other malignant neoplasm of bronchus and lung: Secondary | ICD-10-CM

## 2023-12-22 DIAGNOSIS — Z9841 Cataract extraction status, right eye: Secondary | ICD-10-CM

## 2023-12-22 DIAGNOSIS — Z66 Do not resuscitate: Secondary | ICD-10-CM | POA: Diagnosis present

## 2023-12-22 DIAGNOSIS — Z48812 Encounter for surgical aftercare following surgery on the circulatory system: Secondary | ICD-10-CM | POA: Diagnosis not present

## 2023-12-22 DIAGNOSIS — Z833 Family history of diabetes mellitus: Secondary | ICD-10-CM | POA: Diagnosis not present

## 2023-12-22 DIAGNOSIS — Z85828 Personal history of other malignant neoplasm of skin: Secondary | ICD-10-CM | POA: Diagnosis not present

## 2023-12-22 DIAGNOSIS — J189 Pneumonia, unspecified organism: Secondary | ICD-10-CM | POA: Insufficient documentation

## 2023-12-22 DIAGNOSIS — F5101 Primary insomnia: Secondary | ICD-10-CM | POA: Diagnosis present

## 2023-12-22 DIAGNOSIS — I959 Hypotension, unspecified: Secondary | ICD-10-CM

## 2023-12-22 DIAGNOSIS — D500 Iron deficiency anemia secondary to blood loss (chronic): Secondary | ICD-10-CM | POA: Insufficient documentation

## 2023-12-22 DIAGNOSIS — N3281 Overactive bladder: Secondary | ICD-10-CM | POA: Diagnosis present

## 2023-12-22 DIAGNOSIS — Z79899 Other long term (current) drug therapy: Secondary | ICD-10-CM

## 2023-12-22 DIAGNOSIS — Z08 Encounter for follow-up examination after completed treatment for malignant neoplasm: Secondary | ICD-10-CM | POA: Diagnosis not present

## 2023-12-22 DIAGNOSIS — E1159 Type 2 diabetes mellitus with other circulatory complications: Secondary | ICD-10-CM | POA: Diagnosis not present

## 2023-12-22 DIAGNOSIS — I5032 Chronic diastolic (congestive) heart failure: Secondary | ICD-10-CM | POA: Diagnosis not present

## 2023-12-22 DIAGNOSIS — Z881 Allergy status to other antibiotic agents status: Secondary | ICD-10-CM

## 2023-12-22 DIAGNOSIS — E876 Hypokalemia: Secondary | ICD-10-CM | POA: Diagnosis present

## 2023-12-22 DIAGNOSIS — K921 Melena: Secondary | ICD-10-CM | POA: Diagnosis not present

## 2023-12-22 DIAGNOSIS — R911 Solitary pulmonary nodule: Secondary | ICD-10-CM | POA: Diagnosis present

## 2023-12-22 DIAGNOSIS — J168 Pneumonia due to other specified infectious organisms: Secondary | ICD-10-CM | POA: Diagnosis not present

## 2023-12-22 DIAGNOSIS — I152 Hypertension secondary to endocrine disorders: Secondary | ICD-10-CM | POA: Diagnosis present

## 2023-12-22 DIAGNOSIS — Z87891 Personal history of nicotine dependence: Secondary | ICD-10-CM | POA: Diagnosis not present

## 2023-12-22 DIAGNOSIS — K5791 Diverticulosis of intestine, part unspecified, without perforation or abscess with bleeding: Secondary | ICD-10-CM | POA: Diagnosis not present

## 2023-12-22 DIAGNOSIS — Z7901 Long term (current) use of anticoagulants: Secondary | ICD-10-CM | POA: Diagnosis not present

## 2023-12-22 DIAGNOSIS — C189 Malignant neoplasm of colon, unspecified: Secondary | ICD-10-CM | POA: Diagnosis present

## 2023-12-22 DIAGNOSIS — Z8673 Personal history of transient ischemic attack (TIA), and cerebral infarction without residual deficits: Secondary | ICD-10-CM

## 2023-12-22 DIAGNOSIS — Z85820 Personal history of malignant melanoma of skin: Secondary | ICD-10-CM

## 2023-12-22 DIAGNOSIS — F339 Major depressive disorder, recurrent, unspecified: Secondary | ICD-10-CM | POA: Diagnosis present

## 2023-12-22 DIAGNOSIS — Z882 Allergy status to sulfonamides status: Secondary | ICD-10-CM

## 2023-12-22 DIAGNOSIS — R710 Precipitous drop in hematocrit: Secondary | ICD-10-CM | POA: Diagnosis not present

## 2023-12-22 DIAGNOSIS — I5030 Unspecified diastolic (congestive) heart failure: Secondary | ICD-10-CM | POA: Diagnosis not present

## 2023-12-22 DIAGNOSIS — E861 Hypovolemia: Secondary | ICD-10-CM | POA: Diagnosis present

## 2023-12-22 DIAGNOSIS — Z7984 Long term (current) use of oral hypoglycemic drugs: Secondary | ICD-10-CM | POA: Diagnosis not present

## 2023-12-22 DIAGNOSIS — R918 Other nonspecific abnormal finding of lung field: Secondary | ICD-10-CM | POA: Diagnosis not present

## 2023-12-22 DIAGNOSIS — I350 Nonrheumatic aortic (valve) stenosis: Secondary | ICD-10-CM | POA: Diagnosis not present

## 2023-12-22 DIAGNOSIS — I9589 Other hypotension: Secondary | ICD-10-CM | POA: Diagnosis present

## 2023-12-22 DIAGNOSIS — R627 Adult failure to thrive: Secondary | ICD-10-CM | POA: Diagnosis not present

## 2023-12-22 DIAGNOSIS — I700 Atherosclerosis of aorta: Secondary | ICD-10-CM | POA: Diagnosis not present

## 2023-12-22 DIAGNOSIS — J90 Pleural effusion, not elsewhere classified: Secondary | ICD-10-CM | POA: Diagnosis not present

## 2023-12-22 DIAGNOSIS — R0602 Shortness of breath: Secondary | ICD-10-CM | POA: Diagnosis not present

## 2023-12-22 DIAGNOSIS — E785 Hyperlipidemia, unspecified: Secondary | ICD-10-CM | POA: Diagnosis present

## 2023-12-22 DIAGNOSIS — Z8701 Personal history of pneumonia (recurrent): Secondary | ICD-10-CM

## 2023-12-22 DIAGNOSIS — I10 Essential (primary) hypertension: Secondary | ICD-10-CM | POA: Diagnosis present

## 2023-12-22 DIAGNOSIS — Z860101 Personal history of adenomatous and serrated colon polyps: Secondary | ICD-10-CM

## 2023-12-22 DIAGNOSIS — R5381 Other malaise: Secondary | ICD-10-CM | POA: Diagnosis present

## 2023-12-22 LAB — HEMOGLOBIN AND HEMATOCRIT, BLOOD
HCT: 21.9 % — ABNORMAL LOW (ref 36.0–46.0)
HCT: 23.3 % — ABNORMAL LOW (ref 36.0–46.0)
Hemoglobin: 6.7 g/dL — ABNORMAL LOW (ref 12.0–15.0)
Hemoglobin: 7.4 g/dL — ABNORMAL LOW (ref 12.0–15.0)

## 2023-12-22 LAB — COMPREHENSIVE METABOLIC PANEL
ALT: 31 U/L (ref 0–44)
AST: 33 U/L (ref 15–41)
Albumin: 2.7 g/dL — ABNORMAL LOW (ref 3.5–5.0)
Alkaline Phosphatase: 113 U/L (ref 38–126)
Anion gap: 10 (ref 5–15)
BUN: 18 mg/dL (ref 8–23)
CO2: 24 mmol/L (ref 22–32)
Calcium: 9.6 mg/dL (ref 8.9–10.3)
Chloride: 102 mmol/L (ref 98–111)
Creatinine, Ser: 0.8 mg/dL (ref 0.44–1.00)
GFR, Estimated: >60 mL/min (ref >60)
Glucose, Bld: 202 mg/dL — ABNORMAL HIGH (ref 70–99)
Potassium: 4.8 mmol/L (ref 3.5–5.1)
Sodium: 136 mmol/L (ref 135–145)
Total Bilirubin: 0.2 mg/dL (ref 0.0–1.2)
Total Protein: 7.2 g/dL (ref 6.5–8.1)

## 2023-12-22 LAB — CBC WITH DIFFERENTIAL/PLATELET
Abs Immature Granulocytes: 0.08 10*3/uL — ABNORMAL HIGH (ref 0.00–0.07)
Basophils Absolute: 0.1 10*3/uL (ref 0.0–0.1)
Basophils Relative: 1 %
Eosinophils Absolute: 0.1 10*3/uL (ref 0.0–0.5)
Eosinophils Relative: 1 %
HCT: 28.2 % — ABNORMAL LOW (ref 36.0–46.0)
Hemoglobin: 8.6 g/dL — ABNORMAL LOW (ref 12.0–15.0)
Immature Granulocytes: 1 %
Lymphocytes Relative: 14 %
Lymphs Abs: 1.4 10*3/uL (ref 0.7–4.0)
MCH: 26.5 pg (ref 26.0–34.0)
MCHC: 30.5 g/dL (ref 30.0–36.0)
MCV: 86.8 fL (ref 80.0–100.0)
Monocytes Absolute: 0.7 10*3/uL (ref 0.1–1.0)
Monocytes Relative: 6 %
Neutro Abs: 8.2 10*3/uL — ABNORMAL HIGH (ref 1.7–7.7)
Neutrophils Relative %: 77 %
Platelets: 383 10*3/uL (ref 150–400)
RBC: 3.25 MIL/uL — ABNORMAL LOW (ref 3.87–5.11)
RDW: 18.2 % — ABNORMAL HIGH (ref 11.5–15.5)
WBC: 10.5 10*3/uL (ref 4.0–10.5)
nRBC: 0 % (ref 0.0–0.2)

## 2023-12-22 LAB — LACTIC ACID, PLASMA
Lactic Acid, Venous: 1.4 mmol/L (ref 0.5–1.9)
Lactic Acid, Venous: 1.1 mmol/L (ref 0.5–1.9)

## 2023-12-22 LAB — PROCALCITONIN: Procalcitonin: <0.1 ng/mL

## 2023-12-22 LAB — CBG MONITORING, ED
Glucose-Capillary: 103 mg/dL — ABNORMAL HIGH (ref 70–99)
Glucose-Capillary: 184 mg/dL — ABNORMAL HIGH (ref 70–99)

## 2023-12-22 LAB — PREPARE RBC (CROSSMATCH)

## 2023-12-22 LAB — MRSA NEXT GEN BY PCR, NASAL: MRSA by PCR Next Gen: NOT DETECTED

## 2023-12-22 MED ORDER — SODIUM CHLORIDE 0.9 % IV SOLN
500.0000 mg | Freq: Once | INTRAVENOUS | Status: AC
  Administered 2023-12-22: 500 mg via INTRAVENOUS
  Filled 2023-12-22: qty 5

## 2023-12-22 MED ORDER — ONDANSETRON HCL 4 MG/2ML IJ SOLN
4.0000 mg | Freq: Four times a day (QID) | INTRAMUSCULAR | Status: AC | PRN
  Administered 2023-12-24 – 2023-12-26 (×3): 4 mg via INTRAVENOUS
  Filled 2023-12-22 (×3): qty 2

## 2023-12-22 MED ORDER — SODIUM CHLORIDE 0.9 % IV SOLN
2.0000 g | Freq: Once | INTRAVENOUS | Status: AC
  Administered 2023-12-22: 2 g via INTRAVENOUS
  Filled 2023-12-22: qty 12.5

## 2023-12-22 MED ORDER — INSULIN ASPART 100 UNIT/ML IJ SOLN
0.0000 [IU] | Freq: Three times a day (TID) | INTRAMUSCULAR | Status: DC
  Administered 2023-12-23: 2 [IU] via SUBCUTANEOUS
  Administered 2023-12-23: 3 [IU] via SUBCUTANEOUS
  Administered 2023-12-23 – 2023-12-24 (×2): 2 [IU] via SUBCUTANEOUS
  Filled 2023-12-22 (×4): qty 1

## 2023-12-22 MED ORDER — SODIUM CHLORIDE 0.9 % IV BOLUS
1000.0000 mL | Freq: Once | INTRAVENOUS | Status: AC
  Administered 2023-12-22: 1000 mL via INTRAVENOUS

## 2023-12-22 MED ORDER — VANCOMYCIN HCL IN DEXTROSE 1-5 GM/200ML-% IV SOLN
1000.0000 mg | Freq: Once | INTRAVENOUS | Status: AC
  Administered 2023-12-22: 1000 mg via INTRAVENOUS
  Filled 2023-12-22: qty 200

## 2023-12-22 MED ORDER — SODIUM CHLORIDE 0.9 % IV BOLUS
500.0000 mL | Freq: Once | INTRAVENOUS | Status: AC
  Administered 2023-12-22: 500 mL via INTRAVENOUS

## 2023-12-22 MED ORDER — SODIUM CHLORIDE 0.9% IV SOLUTION
Freq: Once | INTRAVENOUS | Status: AC
  Filled 2023-12-22: qty 250

## 2023-12-22 MED ORDER — ONDANSETRON HCL 4 MG PO TABS: 4.0000 mg | ORAL_TABLET | Freq: Four times a day (QID) | ORAL | Status: AC | PRN

## 2023-12-22 MED ORDER — MELATONIN 5 MG PO TABS
5.0000 mg | ORAL_TABLET | Freq: Every evening | ORAL | Status: DC | PRN
  Administered 2023-12-22 – 2023-12-27 (×6): 5 mg via ORAL
  Filled 2023-12-22 (×6): qty 1

## 2023-12-22 MED ORDER — HYDRALAZINE HCL 20 MG/ML IJ SOLN: 5.0000 mg | Freq: Four times a day (QID) | INTRAMUSCULAR | Status: DC | PRN

## 2023-12-22 MED ORDER — ACETAMINOPHEN 650 MG RE SUPP: 650.0000 mg | Freq: Four times a day (QID) | RECTAL | Status: AC | PRN

## 2023-12-22 MED ORDER — SODIUM CHLORIDE 0.9 % IV SOLN
2.0000 g | Freq: Two times a day (BID) | INTRAVENOUS | Status: DC
Start: 2023-12-23 — End: 2023-12-23
  Administered 2023-12-23: 2 g via INTRAVENOUS
  Filled 2023-12-22: qty 12.5

## 2023-12-22 MED ORDER — INSULIN ASPART 100 UNIT/ML IJ SOLN
0.0000 [IU] | Freq: Every day | INTRAMUSCULAR | Status: DC
  Filled 2023-12-22: qty 1

## 2023-12-22 MED ORDER — PANTOPRAZOLE SODIUM 40 MG IV SOLR
80.0000 mg | Freq: Two times a day (BID) | INTRAVENOUS | Status: DC
  Administered 2023-12-23 – 2023-12-28 (×12): 80 mg via INTRAVENOUS
  Filled 2023-12-22 (×12): qty 20

## 2023-12-22 MED ORDER — ACETAMINOPHEN 325 MG PO TABS
650.0000 mg | ORAL_TABLET | Freq: Four times a day (QID) | ORAL | Status: AC | PRN
Start: 2023-12-22 — End: 2023-12-27

## 2023-12-22 MED ORDER — SODIUM CHLORIDE 0.9 % IV SOLN
500.0000 mg | INTRAVENOUS | Status: AC
  Administered 2023-12-23: 500 mg via INTRAVENOUS
  Filled 2023-12-22: qty 5

## 2023-12-22 MED ORDER — PANTOPRAZOLE SODIUM 40 MG IV SOLR
80.0000 mg | Freq: Once | INTRAVENOUS | Status: AC
  Administered 2023-12-22: 80 mg via INTRAVENOUS
  Filled 2023-12-22: qty 20

## 2023-12-22 NOTE — Progress Notes (Signed)
 [facility]  Telephone:([phone]) [phone] Fax:(336) [phone]  ID: [name] Fire OB: [birth year]  MR#: [id number]  RDW#:[id number]  Patient Care Team: Wellington Curtis LABOR, FNP as PCP - General (Family Medicine) Myrna Adine Anes, MD as Consulting Physician (Ophthalmology) Dasher, Alm LABOR, MD (Dermatology) Jacobo Evalene PARAS, MD as Consulting Physician (Oncology)  CHIEF COMPLAINT: History of stage I colon cancer, history left lower lobe lung nodule presumed malignant, iron  deficiency anemia secondary to diverticular bleed.  INTERVAL HISTORY: Patient returns to clinic today as an add-on for laboratory work and further evaluation.  She continues to have a decreased performance status with worsening weakness and fatigue.  She also complains of increasing shortness of breath and reports persistent melena. She has no neurologic complaints.  She denies any recent fevers.  She has a poor appetite.  She has no chest pain, cough, or hemoptysis.  She denies any nausea, vomiting, constipation, or diarrhea. She has no urinary complaints.  Patient offers no further specific complaints today.  REVIEW OF SYSTEMS:   Review of Systems  Constitutional:  Positive for malaise/fatigue. Negative for fever and weight loss.  Respiratory:  Positive for shortness of breath. Negative for cough and hemoptysis.   Cardiovascular: Negative.  Negative for chest pain and leg swelling.  Gastrointestinal: Negative.  Negative for abdominal pain, blood in stool and melena.  Genitourinary: Negative.  Negative for hematuria.  Musculoskeletal: Negative.  Negative for back pain.  Skin: Negative.  Negative for rash.  Neurological:  Positive for weakness. Negative for dizziness, focal weakness and headaches.  Psychiatric/Behavioral:  Positive for depression. The patient is not nervous/anxious and does not have insomnia.     As per HPI. Otherwise, a complete review of systems is negative.  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   a.) CT chest 03/09/2023: fusiform dilatation measuring 4.3 cm   Basal cell carcinoma (BCC) of left medial cheek    a.) s/p Mohs surgery 07/13/2022 at [facility]   Bilateral carotid artery disease [facility])    a.) doppler 04/03/2007: 50-75% RICA; b.) s/p LEFT CEA with CorMatrix patch angioplasty 01/18/2015; c.) doppler 11/02/2016: 40-59% RICA; d.) doppler 01/10/2018: 1-39% RICA   Diverticulosis 08/05/2015   Eczema of both upper extremities 06/10/2021   Heart failure with preserved left ventricular function (HFpEF) (HCC)    a.) TTE 05/17/2017: EF >55%, mild LVH, mild LAE, mild panval regurg; b.) TTE 06/27/2018 : EF >55%, mod LVH, mild LAE, mild panval regurg'; G1dd; c.) TTE 12/07/2019: EF >55%, mild LVH, mild MAC, mild panval regurg; d.) TTE 01/27/2021: EF >55%, mod LVH, triv TR, mild AR/MR, G1DD   Heart murmur    History of right cataract extraction    Hyperlipidemia    Hypertension    Insomnia    a.) on BZO (temazepam ) PRN   Irritable bowel syndrome with diarrhea 04/20/2017   LBP (low back pain) 08/05/2015   Melanoma (HCC)    a.) s/p Mohs surgery 02/06/2020 - LEFT medial brow -->  V to Y advancement flap   Melena    Non-small cell lung cancer (NSCLC) (HCC) 03/09/2023   a.) CT chest 03/09/2023 --> superior segment LLL --> 2.1 x 1.7 x 2.1 cm mixed ground-glass and subpleural nodule extending to pleura with irreg margins --> concerning for primary bronchogenic carcinoma rather than metastatic disease; b.) stage  I adenocarcinoma of the LLL s/p SBRT (60 Gy over 6 fractions)   OAB (overactive bladder)    PONV (postoperative nausea and vomiting)    a.) single episode following cataract surgery   PSVT (paroxysmal supraventricular tachycardia) (HCC) 02/22/2015   a.) holter 02/22/2015: 6 runs with longest lasting 10 beats   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   a.) MRI brain 04/02/2007 - subacute RIGHT pontine CVA   Stroke (HCC) 12/16/2014   a.) MRI brain 12/16/2014: punctate foci of restricted diffusion x 5 consistent with microembolic infarctions affecting the RIGHT lateral medulla, LEFT temporal lobe, 2 foci in the RIGHT thalamus, and LEFT inferior  frontal lobe/basal ganglia --> distribution would suggest cardiac or ascending aortic source   T2DM (type 2 diabetes mellitus) (HCC)    Tubular adenoma of colon     PAST SURGICAL HISTORY: Past Surgical History:  Procedure Laterality Date   APPENDECTOMY     BREAST BIOPSY Right    neg   BREAST SURGERY     1980's   CAROTID ENDARTERECTOMY Left 01/18/2015   Procedure: CAROTID ENDARTERECTOMY; Location: [facility]; Surgeon: Cordella Shawl, MD   CATARACT EXTRACTION Right 2012   CHOLECYSTECTOMY  06/27/2012   COLONOSCOPY N/A 10/05/2023   Procedure: COLONOSCOPY;  Surgeon: Toledo, Ladell POUR, MD;  Location: [facility] ENDOSCOPY;  Service: Gastroenterology;  Laterality: N/A;   COLONOSCOPY WITH PROPOFOL  N/A 02/17/2023   Procedure: COLONOSCOPY WITH PROPOFOL ;  Surgeon: Maryruth Ole DASEN, MD;  Location: [facility] ENDOSCOPY;  Service: Endoscopy;  Laterality: N/A;   DECOMPRESSIVE LUMBAR LAMINECTOMY LEVEL 1  1950   ESOPHAGEAL BRUSHING  10/03/2023   Procedure: ESOPHAGEAL BRUSHING;  Surgeon: Aundria, Ladell POUR, MD;  Location: [facility] ENDOSCOPY;  Service: Gastroenterology;;   ESOPHAGOGASTRODUODENOSCOPY N/A 10/03/2023   Procedure: ESOPHAGOGASTRODUODENOSCOPY (EGD);  Surgeon: Toledo, Ladell POUR, MD;  Location: [facility] ENDOSCOPY;  Service: Gastroenterology;  Laterality: N/A;    ESOPHAGOGASTRODUODENOSCOPY (EGD) WITH PROPOFOL  N/A 02/16/2023   Procedure: ESOPHAGOGASTRODUODENOSCOPY (EGD) WITH PROPOFOL ;  Surgeon: Maryruth Ole DASEN, MD;  Location: [facility] ENDOSCOPY;  Service: Endoscopy;  Laterality: N/A;   IVC FILTER INSERTION N/A 12/08/2023   Procedure: IVC FILTER INSERTION;  Surgeon: Shawl Cordella MATSU, MD;  Location: [facility] INVASIVE CV LAB;  Service: Cardiovascular;  Laterality: N/A;   MELANOMA EXCISION Left 02/06/2020   Procedure: MOHS SURGERY WITH V TO Y ADVANCEMENT FLAP (LEFT MEDIAL EYEBROW); Location: [facility]   MOHS SURGERY Left 07/13/2022   Procedure: MOHS SURGERY (BCC LEFT MEDIAL CHEEK); Location: [facility]   PULMONARY THROMBECTOMY Bilateral 12/08/2023   Procedure: PULMONARY THROMBECTOMY;  Surgeon: Shawl Cordella MATSU, MD;  Location: [facility] INVASIVE CV LAB;  Service: Cardiovascular;  Laterality: Bilateral;    FAMILY HISTORY: Family History  Problem Relation Age of Onset   Ovarian cancer Mother    Heart disease Brother    Heart attack Brother    Gout Brother    Alzheimer's disease Brother    Dementia Brother    Heart attack Brother    Diabetes Daughter    Melanoma Daughter    Stroke Son    Breast cancer Neg Hx     ADVANCED DIRECTIVES (Y/N):  N  HEALTH MAINTENANCE: Social History   Tobacco Use  Smoking status: Former    Current packs/day: 0.00    Types: Cigarettes    Quit date: 11/15/1976    Years since quitting: 47.1   Smokeless tobacco: Never  Vaping Use   Vaping status: Never Used  Substance Use Topics   Alcohol use: No   Drug use: No     Colonoscopy:  PAP:  Bone density:  Lipid panel:  Allergies  Allergen Reactions   Codeine    Nitrofurantoin     Other reaction(s): Other (See Comments)   Tramadol Nausea Only   Doxycycline  Rash   Penicillins Swelling, Rash and Other (See Comments)    Throat swelling    Sulfa Antibiotics Rash    Current Outpatient Medications  Medication Sig Dispense Refill   apixaban  (ELIQUIS ) 2.5 MG TABS tablet Take 1  tablet (2.5 mg total) by mouth 2 (two) times daily. 60 tablet 0   atorvastatin  (LIPITOR) 40 MG tablet Take 1 tablet (40 mg total) by mouth daily. 90 tablet 3   cholecalciferol  (VITAMIN D ) 1000 UNITS tablet Take 1,000 Units by mouth daily.      glipiZIDE  (GLUCOTROL  XL) 5 MG 24 hr tablet Take 1 tablet (5 mg total) by mouth daily with breakfast. 90 tablet 3   pantoprazole  (PROTONIX ) 40 MG tablet Take 1 tablet (40 mg total) by mouth daily. 30 tablet 0   PARoxetine  (PAXIL ) 10 MG tablet Take 1 tablet (10 mg total) by mouth daily. 90 tablet 0   Polyethylene Glycol 3350  (MIRALAX  PO) Take by mouth.     simethicone  (MYLICON) 125 MG chewable tablet Chew 1 tablet (125 mg total) by mouth every 6 (six) hours as needed for flatulence. 120 tablet 0   temazepam  (RESTORIL ) 15 MG capsule TAKE 1 CAPSULE BY MOUTH AT BEDTIME AS NEEDED FOR SLEEP 90 capsule 3   No current facility-administered medications for this visit.    OBJECTIVE: Vitals:   12/22/23 1014  BP: (!) 141/66  Pulse: 93  Resp: 16  Temp: (!) 96.1 F (35.6 C)  SpO2: 96%     Body mass index is 20.85 kg/m.    ECOG FS:3 - Symptomatic, >50% confined to bed  General: Well-developed, well-nourished, no acute distress.  Sitting in wheelchair. Eyes: Pink conjunctiva, anicteric sclera. HEENT: Normocephalic, moist mucous membranes. Lungs: No audible wheezing or coughing. Heart: Regular rate and rhythm. Abdomen: Soft, nontender, no obvious distention. Musculoskeletal: No edema, cyanosis, or clubbing. Neuro: Alert, answering all questions appropriately. Cranial nerves grossly intact. Skin: No rashes or petechiae noted. Psych: Normal affect.  LAB RESULTS:  Lab Results  Component Value Date   NA 139 12/12/2023   K 3.7 12/12/2023   CL 104 12/12/2023   CO2 26 12/12/2023   GLUCOSE 144 (H) 12/12/2023   BUN 10 12/12/2023   CREATININE 0.68 12/12/2023   CALCIUM  8.2 (L) 12/12/2023   PROT 6.5 12/07/2023   ALBUMIN 2.6 (L) 12/07/2023   AST 37  12/07/2023   ALT 22 12/07/2023   ALKPHOS 121 12/07/2023   BILITOT 1.6 (H) 12/07/2023   GFRNONAA >60 12/12/2023   GFRAA 82 05/14/2020    Lab Results  Component Value Date   WBC 10.5 12/22/2023   NEUTROABS 8.2 (H) 12/22/2023   HGB 8.6 (L) 12/22/2023   HCT 28.2 (L) 12/22/2023   MCV 86.8 12/22/2023   PLT 383 12/22/2023   Lab Results  Component Value Date   IRON  33 12/15/2023   TIBC 232 (L) 12/15/2023   IRONPCTSAT 14 12/15/2023   Lab Results  Component  Value Date   FERRITIN 330 (H) 12/15/2023     STUDIES: ECHOCARDIOGRAM COMPLETE Result Date: 12/09/2023    ECHOCARDIOGRAM REPORT   Patient Name:   [name] Date of Exam: 12/09/2023 Medical Rec #:  [id number]      Height:       62.0 in Accession #:    [id number]     Weight:       116.8 lb Date of Birth:  1929/08/28       BSA:          1.521 m Patient Age:    89 years       BP:           105/51 mmHg Patient Gender: F              HR:           74 bpm. Exam Location:  [facility] Procedure: 2D Echo, Cardiac Doppler and Color Doppler Indications:     Pulmonary embolus I26.09  History:         Patient has prior history of Echocardiogram examinations, most                  recent 08/12/2023. Stroke; Risk Factors:Diabetes. Aortic root                  dilatation, aortic stenosis.  Sonographer:     Christopher Furnace Referring Phys:  [id number] CORDELLA MATSU SCHNIER Diagnosing Phys: Evalene Lunger MD IMPRESSIONS  1. Left ventricular ejection fraction, by estimation, is 60 to 65%. Left ventricular ejection fraction by PLAX is 71 %. The left ventricle has normal function. The left ventricle has no regional wall motion abnormalities. Left ventricular diastolic parameters are consistent with Grade I diastolic dysfunction (impaired relaxation).  2. Right ventricular systolic function is mildly reduced. The right ventricular size is mildly enlarged. There is normal pulmonary artery systolic pressure. The estimated right ventricular systolic pressure is 30.4 mmHg.  3. The mitral  valve is normal in structure. Mild mitral valve regurgitation. No evidence of mitral stenosis. The mean mitral valve gradient is 4.0 mmHg. Moderate mitral annular calcification.  4. The aortic valve is normal in structure. There is severe calcifcation of the aortic valve. Aortic valve regurgitation is not visualized. Moderate aortic valve stenosis. Aortic valve mean gradient measures 18.5 mmHg.  5. The inferior vena cava is normal in size with greater than 50% respiratory variability, suggesting right atrial pressure of 3 mmHg. FINDINGS  Left Ventricle: Left ventricular ejection fraction, by estimation, is 60 to 65%. Left ventricular ejection fraction by PLAX is 71 %. The left ventricle has normal function. The left ventricle has no regional wall motion abnormalities. The left ventricular internal cavity size was normal in size. There is no left ventricular hypertrophy. Left ventricular diastolic parameters are consistent with Grade I diastolic dysfunction (impaired relaxation). Right Ventricle: The right ventricular size is mildly enlarged. No increase in right ventricular wall thickness. Right ventricular systolic function is mildly reduced. There is normal pulmonary artery systolic pressure. The tricuspid regurgitant velocity  is 2.52 m/s, and with an assumed right atrial pressure of 5 mmHg, the estimated right ventricular systolic pressure is 30.4 mmHg. Left Atrium: Left atrial size was normal in size. Right Atrium: Right atrial size was normal in size. Pericardium: There is no evidence of pericardial effusion. Mitral Valve: The mitral valve is normal in structure. There is mild thickening of the mitral valve leaflet(s). There is mild calcification of the mitral valve  leaflet(s). Moderate mitral annular calcification. Mild mitral valve regurgitation. No evidence of mitral valve stenosis. MV peak gradient, 10.4 mmHg. The mean mitral valve gradient is 4.0 mmHg. Tricuspid Valve: The tricuspid valve is normal in  structure. Tricuspid valve regurgitation is mild . No evidence of tricuspid stenosis. Aortic Valve: The aortic valve is normal in structure. There is severe calcifcation of the aortic valve. Aortic valve regurgitation is not visualized. Moderate aortic stenosis is present. Aortic valve mean gradient measures 18.5 mmHg. Aortic valve peak gradient measures 29.8 mmHg. Aortic valve area, by VTI measures 1.33 cm. Pulmonic Valve: The pulmonic valve was normal in structure. Pulmonic valve regurgitation is not visualized. No evidence of pulmonic stenosis. Aorta: The aortic root is normal in size and structure. Venous: The inferior vena cava is normal in size with greater than 50% respiratory variability, suggesting right atrial pressure of 3 mmHg. IAS/Shunts: No atrial level shunt detected by color flow Doppler. Additional Comments: There is a small pleural effusion in the left lateral region.  LEFT VENTRICLE PLAX 2D LV EF:         Left            Diastology                ventricular     LV e' medial:    3.48 cm/s                ejection        LV E/e' medial:  29.3                fraction by     LV e' lateral:   4.68 cm/s                PLAX is 71      LV E/e' lateral: 21.8                %. LVIDd:         4.50 cm LVIDs:         2.70 cm LV PW:         1.60 cm LV IVS:        1.00 cm LVOT diam:     2.00 cm LV SV:         69 LV SV Index:   45 LVOT Area:     3.14 cm  RIGHT VENTRICLE RV Basal diam:  3.70 cm RV Mid diam:    3.80 cm RV S prime:     17.00 cm/s TAPSE (M-mode): 2.2 cm LEFT ATRIUM             Index        RIGHT ATRIUM           Index LA diam:        3.90 cm 2.56 cm/m   RA Area:     10.50 cm LA Vol (A2C):   28.3 ml 18.60 ml/m  RA Volume:   18.50 ml  12.16 ml/m LA Vol (A4C):   31.6 ml 20.77 ml/m LA Biplane Vol: 30.2 ml 19.85 ml/m  AORTIC VALVE AV Area (Vmax):    1.07 cm AV Area (Vmean):   1.01 cm AV Area (VTI):     1.33 cm AV Vmax:           273.00 cm/s AV Vmean:          198.000 cm/s AV VTI:             0.519 m  AV Peak Grad:      29.8 mmHg AV Mean Grad:      18.5 mmHg LVOT Vmax:         93.40 cm/s LVOT Vmean:        63.500 cm/s LVOT VTI:          0.219 m LVOT/AV VTI ratio: 0.42  AORTA Ao Root diam: 3.10 cm MITRAL VALVE                TRICUSPID VALVE MV Area (PHT): 2.33 cm     TR Peak grad:   25.4 mmHg MV Area VTI:   1.89 cm     TR Vmax:        252.00 cm/s MV Peak grad:  10.4 mmHg MV Mean grad:  4.0 mmHg     SHUNTS MV Vmax:       1.61 m/s     Systemic VTI:  0.22 m MV Vmean:      97.4 cm/s    Systemic Diam: 2.00 cm MV Decel Time: 325 msec MV E velocity: 102.00 cm/s MV A velocity: 149.00 cm/s MV E/A ratio:  0.68 Evalene Lunger MD Electronically signed by Evalene Lunger MD Signature Date/Time: 12/09/2023/2:44:14 PM    Final    PERIPHERAL VASCULAR CATHETERIZATION Result Date: 12/08/2023 See surgical note for result.  US  Venous Img Lower Bilateral (DVT) Result Date: 12/07/2023 CLINICAL DATA:  Pulmonary embolism. EXAM: BILATERAL LOWER EXTREMITY VENOUS DOPPLER ULTRASOUND TECHNIQUE: Gray-scale sonography with graded compression, as well as color Doppler and duplex ultrasound were performed to evaluate the lower extremity deep venous systems from the level of the common femoral vein and including the common femoral, femoral, profunda femoral, popliteal and calf veins including the posterior tibial, peroneal and gastrocnemius veins when visible. The superficial great saphenous vein was also interrogated. Spectral Doppler was utilized to evaluate flow at rest and with distal augmentation maneuvers in the common femoral, femoral and popliteal veins. COMPARISON:  None Available. FINDINGS: RIGHT LOWER EXTREMITY Common Femoral Vein: No evidence of thrombus. Normal compressibility, respiratory phasicity and response to augmentation. Saphenofemoral Junction: No evidence of thrombus. Normal compressibility and flow on color Doppler imaging. Profunda Femoral Vein: No evidence of thrombus. Normal compressibility and flow on  color Doppler imaging. Femoral Vein: No evidence of thrombus. Normal compressibility, respiratory phasicity and response to augmentation. Popliteal Vein: Some flow is present in the proximal popliteal vein. There is occlusive thrombus in the mid to distal segment. Calf Veins: Thrombus in the right peroneal vein. The right posterior tibial vein is patent. Superficial Great Saphenous Vein: No evidence of thrombus. Normal compressibility. Venous Reflux:  None. Other Findings: No evidence of superficial thrombophlebitis or abnormal fluid collection. LEFT LOWER EXTREMITY Common Femoral Vein: No evidence of thrombus. Normal compressibility, respiratory phasicity and response to augmentation. Saphenofemoral Junction: No evidence of thrombus. Normal compressibility and flow on color Doppler imaging. Profunda Femoral Vein: No evidence of thrombus. Normal compressibility and flow on color Doppler imaging. Femoral Vein: No evidence of thrombus. Normal compressibility, respiratory phasicity and response to augmentation. Popliteal Vein: No evidence of thrombus. Normal compressibility, respiratory phasicity and response to augmentation. Calf Veins: Thrombus in left posterior tibial and peroneal veins. Superficial Great Saphenous Vein: No evidence of thrombus. Normal compressibility. Venous Reflux:  None. Other Findings: No evidence of superficial thrombophlebitis or abnormal fluid collection. IMPRESSION: 1. Occlusive thrombus in the mid to distal right popliteal vein. Thrombus in the right peroneal vein. 2. Thrombus in the left posterior tibial and peroneal veins. Electronically  Signed   By: Marcey Moan M.D.   On: 12/07/2023 17:08   CT Angio Chest PE W and/or Wo Contrast Result Date: 12/07/2023 CLINICAL DATA:  Syncopal episode, hypoxia and known recent right-sided pulmonary embolism by CT. History of lung carcinoma and colon carcinoma. Prior radiation therapy to the left lower lobe in July. EXAM: CT ANGIOGRAPHY CHEST WITH  CONTRAST TECHNIQUE: Multidetector CT imaging of the chest was performed using the standard protocol during bolus administration of intravenous contrast. Multiplanar CT image reconstructions and MIPs were obtained to evaluate the vascular anatomy. RADIATION DOSE REDUCTION: This exam was performed according to the departmental dose-optimization program which includes automated exposure control, adjustment of the mA and/or kV according to patient size and/or use of iterative reconstruction technique. CONTRAST:  75mL OMNIPAQUE  IOHEXOL  350 MG/ML SOLN COMPARISON:  CTA of the chest on 11/23/2023 FINDINGS: Cardiovascular: Pulmonary embolism again noted on the right. Thrombus burden is greater than on the prior standard CT likely representing some interval increased thromboembolism since the prior study with increased thrombus at the bifurcation of the right main pulmonary artery with nonocclusive thrombus extending into upper, middle lobe and lower lobe pulmonary arteries. Most significant thrombus extends into the lower lobe pulmonary artery. No thrombus is identified on the left. Dilated central pulmonary arteries again present with the main pulmonary artery measuring 3.2 cm. There is likely some degree of right heart strain with estimated RV/LV ratio of roughly 1.45. Stable aneurysmal disease of the ascending thoracic aorta measuring up to approximately 4.1 cm with associated calcified aortic valve and atherosclerosis of the thoracic aorta. The heart size is normal. Heavily calcified mitral valve annulus again noted. No pericardial fluid. Calcified coronary artery plaque present. Mediastinum/Nodes: No enlarged mediastinal, hilar, or axillary lymph nodes. Thyroid  gland, trachea, and esophagus demonstrate no significant findings. Lungs/Pleura: Stable consolidative opacities in the posterior left upper lobe, inferior lingula and posterior left lower lobe. Some of these are again likely related to prior radiation therapy.  Some degree pneumonia in the left lung cannot be excluded. There is a is stable small amount of left pleural fluid. Small slightly more prominent peripheral opacities in the lateral left upper lobe and posterior left lower lobe may relate to focal pulmonary infarcts and/or atelectasis. No pneumothorax. Upper Abdomen: No acute abnormality. Musculoskeletal: No chest wall abnormality. No acute or significant osseous findings. Review of the MIP images confirms the above findings. IMPRESSION: 1. Increased thrombus burden in the right pulmonary arteries since the prior standard CT of 11/23/2023, likely representing interval increased thromboembolic burden. There is likely some degree of right heart strain with estimated RV/LV ratio of roughly 1.45. Consider correlation with echocardiography. 2. Stable consolidative opacities in the posterior left upper lobe, inferior lingula and posterior left lower lobe. Some of these are again likely related to prior radiation therapy. Some degree pneumonia in the left lung cannot be excluded. 3. Stable small amount of left pleural fluid. 4. Small slightly more prominent peripheral opacities in the lateral left upper lobe and posterior left lower lobe may relate to focal pulmonary infarcts and/or atelectasis. 5. 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 6. Aortic atherosclerosis. Aortic aneurysm NOS (ICD10-I71.9). Electronically Signed   By: Marcey Moan M.D.   On: 12/07/2023 14:59   DG Chest 2 View Result Date: 12/07/2023 CLINICAL DATA:  Suspected sepsis. EXAM: CHEST - 2 VIEW COMPARISON:  CT chest dated November 23, 2023.  FINDINGS: The mediastinum remains shifted to the left with similar volume loss and patchy airspace opacification throughout much of the left lung, presumably radiation related. There are a few irregular opacities in the central and peripheral  right upper lobe. Unchanged small left pleural effusion. No pneumothorax. No acute osseous abnormality. IMPRESSION: 1. Irregular opacities in the central and peripheral right upper lobe, concerning for pneumonia. 2. Similar volume loss and post radiation change in the left lung. Electronically Signed   By: Elsie ONEIDA Shoulder M.D.   On: 12/07/2023 14:28   CT CHEST W CONTRAST Result Date: 11/23/2023 CLINICAL DATA:  Follow-up lung cancer. Status post radiation therapy. History of colon cancer also. * Tracking Code: BO * EXAM: CT CHEST WITH CONTRAST TECHNIQUE: Multidetector CT imaging of the chest was performed during intravenous contrast administration. RADIATION DOSE REDUCTION: This exam was performed according to the departmental dose-optimization program which includes automated exposure control, adjustment of the mA and/or kV according to patient size and/or use of iterative reconstruction technique. CONTRAST:  OMNIPAQUE  IOHEXOL  300 MG/ML  SOLN COMPARISON:  Chest CT 03/09/2023 and PET-CT 04/01/2023 FINDINGS: Cardiovascular: The heart is normal in size. No pericardial effusion. Stable tortuosity and ectasia of the thoracic aorta. Stable advanced and progressive atherosclerotic changes along the aortic arch. Stable three-vessel coronary artery calcifications and stable extensive calcifications around the aortic valve and mitral valve annulus. Significant left-sided pulmonary emboli are noted involving both the right upper lobe and right lower lobe pulmonary arteries and their branches. No definite left-sided pulmonary emboli are identified. Mediastinum/Nodes: Interval enlargement mediastinal hilar lymph nodes. 9 mm right hilar node on image number 66/2. 11 mm left hilar node on image number 49/2. 5 mm prevascular lymph nodes. Lungs/Pleura: Extensive airspace opacification in the left lung and loss of volume possibly related to extensive radiation pneumonitis/fibrosis. No discrete mass is identified. Patchy  peripheral airspace opacity in the right lung probably related to the patient's pulmonary emboli with small peripheral pulmonary infarcts. No pleural effusion. Indeterminate 4 mm pulmonary nodule in the right lower lobe on image number 70/4. Attention on future imaging studies is suggested. Upper Abdomen: No significant upper abdominal findings. Diffuse fatty infiltration of the liver is noted. Stable aortic calcifications. No adrenal gland lesions. Musculoskeletal: No significant bony findings. IMPRESSION: 1. Significant right-sided pulmonary emboli involving both the right upper lobe and right lower lobe pulmonary arteries and their branches. No definite left-sided pulmonary emboli are identified. 2. Patchy peripheral airspace opacity in the right lung probably related to the patient's pulmonary emboli with small peripheral pulmonary infarcts. 3. Extensive airspace opacification in the left lung and loss of volume possibly related to extensive radiation pneumonitis/fibrosis. No discrete mass is identified. 4. Interval enlargement of mediastinal and hilar lymph nodes. 5. Indeterminate 4 mm pulmonary nodule in the right lower lobe. Attention on future imaging studies is suggested. 6. Fatty liver. 7. Aortic atherosclerosis. Aortic Atherosclerosis (ICD10-I70.0). These results will be called to the ordering clinician or representative by the Radiologist Assistant, and communication documented in the PACS or [employer]. Electronically Signed   By: MYRTIS Stammer M.D.   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  PLAN:    Iron  deficiency anemia: Secondary to diverticular bleed.  Patient's hemoglobin has decreased from 11.1-8.6 in 1 week's time.  She also reports persistent melena.  I have recommended patient go to the emergency room for  further evaluation and admission to  the hospital.   Diverticular bleed: Appreciate GI input.  Patient currently on Eliquis  which will likely need to be discontinued.  Continue follow-up with GI as indicated.   Pulmonary embolism: CT scan results from November 23, 2023 reviewed independently and reported as above with incidental right sided pulmonary embolism.  Repeat CT scan on December 07, 2023 revealed worsening of clot burden.  Patient has had recent IVC filter placed.  She is currently on low-dose Eliquis  2.5 mg twice per day, but given what appears to be an active GI bleed this will need to be discontinued.   Patient report stage I colon cancer: Patient underwent surgical resection on June 21, 2023.  0/13 lymph nodes did not reveal evidence of malignancy.  Given the stage of disease, she did not require any further adjuvant treatment.  Her most recent colonoscopy as above showed no evidence of malignancy. Left lower lobe lung nodule: No biopsy was obtained, but nodule was approximately 2.2 cm and significantly hypermetabolic on PET scan from Apr 01, 2023 therefore presumed malignant.  She underwent XRT completing in July 2024.  CT scan results as above with no obvious evidence of recurrence.  Consider repeat CT scan in July 2025.   Anxiety/coping/insomnia: Patient was given a previously referral to [facility]. Disposition: Patient was transported to the emergency room for consideration of admission.  Hospice and comfort care were also discussed today, but patient does not appear to be ready to make this decision.  Recommend palliative care consult.   Patient expressed understanding and was in agreement with this plan. She also understands that She can call clinic at any time with any questions, concerns, or complaints.    Cancer Staging  Colon cancer [facility]) Staging form: Colon and Rectum, AJCC 8th Edition - Pathologic stage from 06/21/2023: Stage I (pT2, pN0, cM0) - Signed by Jacobo Evalene PARAS, MD on 10/19/2023 Stage prefix: Initial  diagnosis Total positive nodes: 0   Evalene PARAS Jacobo, MD   12/22/2023 11:56 AM

## 2023-12-22 NOTE — Hospital Course (Addendum)
 Hospital course / significant events:   HPI: [name] is a 45 female with history of colon cancer, pulmonary nodule presumed malignant, history of PE on Eliquis , iron  deficiency anemia, GIB 09/2023 diverticular, chronic blood loss anemia, who presents to the emergency department for chief concerns of black stool x1 week, last Eliquis  taken AM 02/05. Was seen by Dr. Jacobo earlier 02/05 and sent to ED.   Of note, recent pulm embolism in January 2025, initially dx on CT outpatient done by oncology 01/07, at that time hesitant to anticoagulate given GIB Hx and referred to vascular surgery, initial plan was for IVC filter placement 01/28. However prior to that she was admitted at [facility] 01/21 w/ worsening thrombus burden compared to prior CT 01/07, therefore underwent thrombectomy and IVC placement inpatient on 01/22, was discharged from the hospital on 01/26. Has been on Eliquis  since then.    Relevant Hx GI - 10/05/2023 colonoscopy by Dr. Aundria showed end-to-side ileocolonic anastomosis in the ascending colon diverticulosis of the colon. At that time she was admitted with melena and it was felt that the bleeding might have been diverticular  02/05: in ED, Hgb 8.6 on admission (approx 11:30), dropping to 6.7 approx 19:00, transfusion ordered. Also tx for CAP. GI recommendations: high risk for anesthesia, monitor CBC / transfuse as needed, hold off on scope until at least 48h out from last Eliquis  dose, consider CTA w/ IR in interim if worsening bleeding.  02/06: Hgb to 8.4 at 05:00 --> 8.3 at 12:00 - stable 02/07: patient feeling bad this morning after working w/ PT, nauseous, otherwise cannot elaborate on symptoms and neg ROS. Not eating much. UA concerning for UTI, she is already on 3rd gen cephalosporin for pneumonia will continue this pend cultures which may be neg anyway since on abx .  02/08: advancing diet, stool less black, Hgb down some but steady, continue monitor q12h 02/09: eating  better, BM back to normal brown color, energy a bit better today, Hgb improved.  02/10: placement pending (today is Mon) bed search initiated      Consultants:  Gastroenterology   Procedures/Surgeries: none      ASSESSMENT & PLAN:   Upper GI bleed ABLA d/t GIB S/p 2 units PRBC transfusion on 02/05 High risk for anesthesia Protonix  80 mg IV bid Hold/dc home Eliquis   Gastroenterology following peripherally over the weekend  monitor CBC / transfuse as needed hold off on scope unless worse but she has been stable  consider CTA w/ IR if worsening bleeding Ok to advance diet    Hypotension d/t hypovolemia d/t GIB - resolved Responded to aggressive fluid and PRBC Hold antihypertensives GIB tx as above  Encourage po fluids    UA concerning for UTI / asymptomatic bacteriuria  she is already on 3rd gen cephalosporin for pneumonia will continue this, UCx insignificant growth   Pulmonary embolism  diagnosed in January 2025 Status post thrombectomy and IVC filter  Hold Eliquis  d/t GI bleed  Hypokalemia Replace as needed Monitor BMP  Primary insomnia Melatonin 5 mg nightly as needed for sleep   Hypertension associated with diabetes (HCC) Hydralazine  5 mg every 6 hours as needed for SBP > 175, 5 days ordered   Type 2 diabetes mellitus with hyperglycemia, without long-term current use of insulin  (HCC) Insulin  held given low po intake    Query CAP (community acquired pneumonia) Procalcitonin low Deescalate abx to po, given equivocal imaging and (+)significant coughing will treat for CAP   Iron  deficiency anemia  due to chronic blood loss Baseline hemoglobin is 9.3-13.2 in the last 2 weeks See above re ABLA    No active concerns based on BMI: Body mass index is 20.85 kg/m.  However given cancer hx and general debility, there is likely component of malnutrition  Underweight - under 18  overweight - 25 to 29 obese - 30 or more Class 1 obesity: BMI of 30.0 to  34 Class 2 obesity: BMI of 35.0 to 39 Class 3 obesity: BMI of 40.0 to 49 Super Morbid Obesity: BMI 50-59 Super-super Morbid Obesity: BMI 60+ Significantly low or high BMI is associated with higher medical risk.  Weight management advised as adjunct to other disease management and risk reduction treatments    DVT prophylaxis: SCD IV fluids: holding continuous IV fluids  Nutrition: CLD - anticipate may need emergent EGD/scope Central lines / invasive devices: none  Code Status: DNR ACP documentation reviewed:  none on file in [facility]  [facility] needs: SNF rehab  Barriers to dispo / significant pending items: SNF placement will keep her here until at likely tomorrow/Weds

## 2023-12-22 NOTE — ED Provider Triage Note (Signed)
 Emergency Medicine Provider Triage Evaluation Note  [name] , a 89 y.o. female  was evaluated in triage.  Pt has history of colon cancer and complains of rectal bleeding. Stools have been dark and very loose for the past few days and she has felt lightheaded. She was at the cancer center today and advised to come to the ER for symptomatic anemia.  Physical Exam  BP (!) 115/57   Pulse (!) 103   Temp 98.3 F (36.8 C) (Oral)   Resp 16   Ht 5' 2 (1.575 m)   Wt 51.7 kg   SpO2 96%   BMI 20.85 kg/m  Gen:   Awake, no distress   Resp:  Normal effort  MSK:   Moves extremities without difficulty  Other:    Medical Decision Making  Medically screening exam initiated at 12:27 PM.  Appropriate orders placed.  [name] was informed that the remainder of the evaluation will be completed by another provider, this initial triage assessment does not replace that evaluation, and the importance of remaining in the ED until their evaluation is complete.  Labs available in MyChart.   Whitney Kirk NOVAK, FNP 12/22/23 1622

## 2023-12-22 NOTE — Assessment & Plan Note (Signed)
 Status post type and screen per EDP Status post sodium chloride  500 mL liter bolus once, Protonix  80 mg IV once per EDP Continue with Protonix  80 mg IV twice daily ordered on admission Hold home Eliquis  on admission Gastroenterology has been consulted via secure chat PIV: Please ensure and maintain patient has 2 peripheral IV (prefer large-bore) order placed Admit to PCU

## 2023-12-22 NOTE — Assessment & Plan Note (Signed)
 Baseline hemoglobin is 9.3-13.2 in the last 2 weeks Status post type and screen Maintain hemoglobin greater than 8 Recheck CBC in a.m.

## 2023-12-22 NOTE — Sepsis Progress Note (Signed)
 Code Sepsis protocol being monitored by eLink.

## 2023-12-22 NOTE — Consult Note (Signed)
 Pharmacy Antibiotic Note  ASSESSMENT: 89 y.o. female with PMH including colon cancer, LLL lung nodule presumed malignant, IDA is presenting with pneumonia. CXR demonstrates interval progression of right upper lung field opacity, which could be related to pulmonary nodule presumed malignant by oncology.  Imaging notes in January mention opacities related to prior RT but PNA could not be excluded. Patient is VSS but is tachycardic and tachypneic. Pharmacy has been consulted to manage cefepime  dosing.  Patient measurements: Height: 5' 2 (157.5 cm) Weight: 51.7 kg (113 lb 15.7 oz) IBW/kg (Calculated) : 50.1  Vital signs: Temp: 98.3 F (36.8 C) (02/05 1220) Temp Source: Oral (02/05 1220) BP: 114/59 (02/05 1530) Pulse Rate: 89 (02/05 1530) Recent Labs  Lab 12/22/23 1128 12/22/23 1237  WBC 10.5  --   CREATININE  --  0.80   Estimated Creatinine Clearance: 35.5 mL/min (by C-G formula based on SCr of 0.8 mg/dL).  Allergies: Allergies  Allergen Reactions   Codeine    Nitrofurantoin     Other reaction(s): Other (See Comments)   Tramadol Nausea Only   Doxycycline  Rash   Penicillins Swelling, Rash and Other (See Comments)    Throat swelling    Sulfa Antibiotics Rash    Antimicrobials this admission: Cefepime  2/5 >>  Azithromycin  2/5 >> Vancomycin  2/5 x 1  Dose adjustments this admission: N/A  Microbiology results: N/A   PLAN: Initiate cefepime  2 g IV q12H Monitor renal function to assess for any necessary antibiotic dosing changes.   Thank you for allowing pharmacy to be a part of this patient's care.  Will M. Lenon, PharmD Clinical Pharmacist 12/22/2023 3:50 PM

## 2023-12-22 NOTE — Assessment & Plan Note (Signed)
 Hydralazine  5 mg every 6 hours as needed for SBP > 175, 5 days ordered

## 2023-12-22 NOTE — ED Provider Notes (Signed)
 [facility] Provider Note    Event Date/Time   First MD Initiated Contact with Patient 12/22/23 1231     (approximate)   History   Rectal Bleeding   HPI  [name] is a 89 year old female with history of stage I colon cancer, anemia with prior diverticular bleed, recent PE on Eliquis  presenting to the emergency department for evaluation of GI bleeding.  Patient saw her oncologist today a few weeks after starting Eliquis .  There, patient reported increased fatigue and melena over the last week.  Labs demonstrated hemoglobin drop from 11.1-8.6 in a week.  She was directed to the ER for further evaluation.  Patient denies hematemesis, has had black stools.  No fevers or chills.  Reports lightheadedness, no syncope.  Does report some shortness of breath.  Reviewed discharge summary from 1/26 /2025.  Patient underwent thrombectomy and IVC filter placement.  Started on low-dose Eliquis  per vascular surgery recs.    Physical Exam   Triage Vital Signs: ED Triage Vitals  Encounter Vitals Group     BP 12/22/23 1220 (!) 115/57     Systolic BP Percentile --      Diastolic BP Percentile --      Pulse Rate 12/22/23 1220 (!) 103     Resp 12/22/23 1220 16     Temp 12/22/23 1220 98.3 F (36.8 C)     Temp Source 12/22/23 1220 Oral     SpO2 12/22/23 1220 96 %     Weight 12/22/23 1221 113 lb 15.7 oz (51.7 kg)     Height 12/22/23 1221 5' 2 (1.575 m)     Head Circumference --      Peak Flow --      Pain Score 12/22/23 1221 0     Pain Loc --      Pain Education --      Exclude from Growth Chart --     Most recent vital signs: Vitals:   12/22/23 1430 12/22/23 1445  BP: 103/60   Pulse: 81   Resp: (!) 24 (!) 22  Temp:    SpO2: 94%      General: Awake, interactive  CV:  Mild tachycardia Resp:  Unlabored respirations, lungs clear to auscultation Abd:  Nondistended, soft, nontender to palpation, black stools on rectal exam, clearly Hemoccult  positive Neuro:  Symmetric facial movement, fluid speech   ED Results / Procedures / Treatments   Labs (all labs ordered are listed, but only abnormal results are displayed) Labs Reviewed  COMPREHENSIVE METABOLIC PANEL - Abnormal; Notable for the following components:      Result Value   Glucose, Bld 202 (*)    Albumin 2.7 (*)    All other components within normal limits  CULTURE, BLOOD (ROUTINE X 2)  CULTURE, BLOOD (ROUTINE X 2)  LACTIC ACID, PLASMA  LACTIC ACID, PLASMA  TYPE AND SCREEN     EKG EKG independently reviewed interpreted by myself (ER attending) demonstrates:  EKG demonstrates sinus rhythm and rate of 111, PR 150, QRS 92, QTc 397, no acute ST changes  RADIOLOGY Imaging independently reviewed and interpreted by myself demonstrates:  CXR with questionable area of new infiltrate on the right upper lobe, that correlates to area of infiltrates noted on CTA chest from 1/21, await formal radiology read  PROCEDURES:  Critical Care performed: Yes, see critical care procedure note(s)  CRITICAL CARE Performed by: Nilsa Dade   Total critical care time: 31 minutes  Critical care time was  exclusive of separately billable procedures and treating other patients.  Critical care was necessary to treat or prevent imminent or life-threatening deterioration.  Critical care was time spent personally by me on the following activities: development of treatment plan with patient and/or surrogate as well as nursing, discussions with consultants, evaluation of patient's response to treatment, examination of patient, obtaining history from patient or surrogate, ordering and performing treatments and interventions, ordering and review of laboratory studies, ordering and review of radiographic studies, pulse oximetry and re-evaluation of patient's condition.   Procedures   MEDICATIONS ORDERED IN ED: Medications  vancomycin  (VANCOCIN ) IVPB 1000 mg/200 mL premix (has no administration  in time range)  ceFEPIme  (MAXIPIME ) 2 g in sodium chloride  0.9 % 100 mL IVPB (has no administration in time range)  azithromycin  (ZITHROMAX ) 500 mg in sodium chloride  0.9 % 250 mL IVPB (has no administration in time range)  acetaminophen  (TYLENOL ) tablet 650 mg (has no administration in time range)    Or  acetaminophen  (TYLENOL ) suppository 650 mg (has no administration in time range)  ondansetron  (ZOFRAN ) tablet 4 mg (has no administration in time range)    Or  ondansetron  (ZOFRAN ) injection 4 mg (has no administration in time range)  pantoprazole  (PROTONIX ) injection 80 mg (has no administration in time range)  sodium chloride  0.9 % bolus 500 mL (0 mLs Intravenous Stopped 12/22/23 1344)  pantoprazole  (PROTONIX ) injection 80 mg (80 mg Intravenous Given 12/22/23 1456)     IMPRESSION / MDM / ASSESSMENT AND PLAN / ED COURSE  I reviewed the triage vital signs and the nursing notes.  Differential diagnosis includes, but is not limited to, recurrent diverticular bleed in the setting of Eliquis  use, AVM, other lower GI bleeding source, lower suspicion upper GI bleed, suspect weakness and shortness of breath due to anemia, consideration for pneumonia, ongoing symptoms from recent PE  Patient's presentation is most consistent with acute presentation with potential threat to life or bodily function.  89 year old female presenting to the emergency department for evaluation of dark stools and progressive anemia.  CBC obtained shortly prior to presentation with hemoglobin of 8.6, type and screen sent, CMP without critical derangements.  Patient without abdominal pain.  Mild tachycardia, given small fluid bolus with improvement in heart rate.  X-Erle Guster also demonstrates progressive right upper lobe pneumonia.  Recently received Rocephin  and azithromycin , will broaden coverage.  Suspect her tachycardia may be more related to her GI bleed, but we will go ahead and initiate sepsis orders.  Do think patient is  appropriate for admission for further management of her GI bleed and ongoing pneumonia.  Will reach out to hospitalist team.  Case reviewed with Dr. Sherre.  She will evaluate the patient for anticipated admission.      FINAL CLINICAL IMPRESSION(S) / ED DIAGNOSES   Final diagnoses:  Acute GI bleeding  Pneumonia of right upper lobe due to infectious organism     Rx / DC Orders   ED Discharge Orders     None        Note:  This document was prepared using Dragon voice recognition software and may include unintentional dictation errors.   Levander Slate, MD 12/22/23 [phone]

## 2023-12-22 NOTE — Consult Note (Signed)
 Ruel Kung , MD [address], [place], [place], [address] [address], [address], [place], [place], [address] Phone: [phone]  Fax: [phone]  Consultation  Referring Provider:   Dr. Sherre Primary Care Physician:  Wellington Curtis LABOR, FNP Primary Gastroenterologist:  Dr. Aundria    Reason for Consultation:     GI bleed  Date of Admission:  12/22/2023 Date of Consultation:  12/22/2023         HPI:   [name] is a 89 y.o. female is a patient with history of stage I colon cancer, left lower lung nodule presumed malignant, iron  deficiency anemia prior diverticular bleed recent pulm embolism in January.  Had IVC filter placed and had been on Eliquis  on Eliquis  presented to the ER with melena over the past 1 week.  Hemoglobin dropped from 11 g to 8.6 g.  She was seen by Dr. Jacobo earlier today.  She has undergone surgical resection for stage I colon cancer. Shortness of breath on minimal exertion , able to lay flat , no PND or orthopnea. No nsaid use . Last dark bowel movement was earlier today   10/05/2023 colonoscopy by Dr. Aundria showed end-to-side ileocolonic anastomosis in the ascending colon diverticulosis of the colon.  At that time she was admitted with melena and it was felt that the bleeding might have been diverticular  10/03/2023: EGD by Dr. Aundria showed white plaques in the lower end of the esophagus otherwise normal.  12/22/2023 hemoglobin 8.7 g MCV of 86, CMP BUN normal creatinine not elevated. Chest x-ray shows interval progression of right upper lung field opacities small left pleural effusion she is being treated for right upper Lobe pneumonia.    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   a.) CT chest 03/09/2023: fusiform dilatation measuring 4.3 cm   Basal cell carcinoma (BCC) of left medial cheek    a.) s/p Mohs surgery 07/13/2022 at [facility]   Bilateral carotid artery disease [facility])    a.) doppler 04/03/2007: 50-75% RICA; b.) s/p LEFT CEA with CorMatrix patch angioplasty 01/18/2015; c.) doppler 11/02/2016: 40-59% RICA; d.) doppler 01/10/2018: 1-39% RICA   Diverticulosis 08/05/2015   Eczema of both upper extremities 06/10/2021   Heart failure with preserved left ventricular function (HFpEF) (HCC)    a.) TTE 05/17/2017: EF >55%, mild LVH, mild LAE, mild panval regurg; b.) TTE 06/27/2018 : EF >55%, mod LVH, mild LAE, mild panval regurg'; G1dd; c.) TTE 12/07/2019: EF >55%, mild LVH, mild MAC, mild panval regurg; d.) TTE 01/27/2021: EF >55%, mod LVH, triv TR, mild AR/MR, G1DD   Heart murmur    History of right cataract extraction    Hyperlipidemia    Hypertension    Insomnia    a.) on BZO (temazepam ) PRN   Irritable bowel syndrome with diarrhea 04/20/2017   LBP (low back pain) 08/05/2015   Melanoma (HCC)    a.) s/p Mohs surgery 02/06/2020 - LEFT medial brow --> V to Y advancement flap  Melena    Non-small cell lung cancer (NSCLC) (HCC) 03/09/2023   a.) CT chest 03/09/2023 --> superior segment LLL --> 2.1 x 1.7 x 2.1 cm mixed ground-glass and subpleural nodule extending to pleura with irreg margins --> concerning for primary bronchogenic carcinoma rather than metastatic disease; b.) 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 (60 Gy over 6 fractions)   OAB (overactive bladder)    PONV (postoperative nausea and vomiting)    a.) single episode following cataract surgery   PSVT (paroxysmal supraventricular tachycardia) (HCC) 02/22/2015   a.) holter 02/22/2015: 6 runs with longest lasting  10 beats   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   a.) MRI brain 04/02/2007 - subacute RIGHT pontine CVA   Stroke (HCC) 12/16/2014   a.) MRI brain 12/16/2014: punctate foci of restricted diffusion x 5 consistent with microembolic infarctions affecting the RIGHT lateral medulla, LEFT temporal lobe, 2 foci in the RIGHT thalamus, and LEFT inferior  frontal lobe/basal ganglia --> distribution would suggest cardiac or ascending aortic source   T2DM (type 2 diabetes mellitus) (HCC)    Tubular adenoma of colon     Past Surgical History:  Procedure Laterality Date   APPENDECTOMY     BREAST BIOPSY Right    neg   BREAST SURGERY     1980's   CAROTID ENDARTERECTOMY Left 01/18/2015   Procedure: CAROTID ENDARTERECTOMY; Location: [facility]; Surgeon: Cordella Shawl, MD   CATARACT EXTRACTION Right 2012   CHOLECYSTECTOMY  06/27/2012   COLONOSCOPY N/A 10/05/2023   Procedure: COLONOSCOPY;  Surgeon: Toledo, Ladell POUR, MD;  Location: [facility] ENDOSCOPY;  Service: Gastroenterology;  Laterality: N/A;   COLONOSCOPY WITH PROPOFOL  N/A 02/17/2023   Procedure: COLONOSCOPY WITH PROPOFOL ;  Surgeon: Maryruth Ole DASEN, MD;  Location: [facility] ENDOSCOPY;  Service: Endoscopy;  Laterality: N/A;   DECOMPRESSIVE LUMBAR LAMINECTOMY LEVEL 1  1950   ESOPHAGEAL BRUSHING  10/03/2023   Procedure: ESOPHAGEAL BRUSHING;  Surgeon: Aundria, Ladell POUR, MD;  Location: [facility] ENDOSCOPY;  Service: Gastroenterology;;   ESOPHAGOGASTRODUODENOSCOPY N/A 10/03/2023   Procedure: ESOPHAGOGASTRODUODENOSCOPY (EGD);  Surgeon: Toledo, Ladell POUR, MD;  Location: [facility] ENDOSCOPY;  Service: Gastroenterology;  Laterality: N/A;   ESOPHAGOGASTRODUODENOSCOPY (EGD) WITH PROPOFOL  N/A 02/16/2023   Procedure: ESOPHAGOGASTRODUODENOSCOPY (EGD) WITH PROPOFOL ;  Surgeon: Maryruth Ole DASEN, MD;  Location: [facility] ENDOSCOPY;  Service: Endoscopy;  Laterality: N/A;   IVC FILTER INSERTION N/A 12/08/2023   Procedure: IVC FILTER INSERTION;  Surgeon: Shawl Cordella MATSU, MD;  Location:  [facility] INVASIVE CV LAB;  Service: Cardiovascular;  Laterality: N/A;   MELANOMA EXCISION Left 02/06/2020   Procedure: MOHS SURGERY WITH V TO Y ADVANCEMENT FLAP (LEFT MEDIAL EYEBROW); Location: [facility]   MOHS SURGERY Left 07/13/2022   Procedure: MOHS SURGERY (BCC LEFT MEDIAL CHEEK); Location: [facility]   PULMONARY THROMBECTOMY Bilateral 12/08/2023   Procedure: PULMONARY THROMBECTOMY;  Surgeon: Shawl Cordella MATSU, MD;  Location: [facility] INVASIVE CV LAB;  Service: Cardiovascular;  Laterality: Bilateral;    Prior to Admission medications   Medication Sig Start Date End Date Taking? Authorizing Provider  apixaban  (ELIQUIS ) 2.5 MG TABS tablet Take 1 tablet (2.5 mg total) by mouth 2 (two) times daily. 12/12/23 01/11/24  Trudy Anthony HERO, MD  atorvastatin  (LIPITOR) 40 MG tablet Take 1 tablet (40 mg total) by mouth daily. 07/13/23   Emilio Kelly DASEN, FNP  cholecalciferol  (VITAMIN D ) 1000 UNITS tablet Take 1,000 Units by mouth daily.     [provider]  glipiZIDE  (GLUCOTROL  XL) 5 MG 24 hr tablet Take 1 tablet (5 mg total) by mouth  daily with breakfast. 08/26/23   Emilio Kelly DASEN, FNP  pantoprazole  (PROTONIX ) 40 MG tablet Take 1 tablet (40 mg total) by mouth daily. 12/12/23 01/11/24  Trudy Anthony HERO, MD  PARoxetine  (PAXIL ) 10 MG tablet Take 1 tablet (10 mg total) by mouth daily. 10/13/23   Emilio Kelly DASEN, FNP  Polyethylene Glycol 3350  (MIRALAX  PO) Take by mouth.    [provider]  simethicone  (MYLICON) 125 MG chewable tablet Chew 1 tablet (125 mg total) by mouth every 6 (six) hours as needed for flatulence. 11/02/23   Emilio Kelly DASEN, FNP  temazepam  (RESTORIL ) 15 MG capsule TAKE 1 CAPSULE BY MOUTH AT BEDTIME AS NEEDED FOR SLEEP 07/13/23   Emilio Kelly DASEN, FNP    Family History  Problem Relation Age of Onset   Ovarian cancer Mother    Heart disease Brother    Heart attack Brother    Gout Brother    Alzheimer's disease Brother    Dementia Brother    Heart attack Brother    Diabetes Daughter     Melanoma Daughter    Stroke Son    Breast cancer Neg Hx      Social History   Tobacco Use   Smoking status: Former    Current packs/day: 0.00    Types: Cigarettes    Quit date: 11/15/1976    Years since quitting: 47.1   Smokeless tobacco: Never  Vaping Use   Vaping status: Never Used  Substance Use Topics   Alcohol use: No   Drug use: No    Allergies as of 12/22/2023 - Review Complete 12/22/2023  Allergen Reaction Noted   Codeine  08/05/2015   Nitrofurantoin  02/13/2020   Tramadol Nausea Only 08/05/2015   Doxycycline  Rash 10/18/2017   Penicillins Swelling, Rash, and Other (See Comments) 08/05/2015   Sulfa antibiotics Rash 08/05/2015    Review of Systems:    All systems reviewed and negative except where noted in HPI.   Physical Exam:  Vital signs in last 24 hours: Temp:  [96.1 F (35.6 C)-98.3 F (36.8 C)] 98.3 F (36.8 C) (02/05 1220) Pulse Rate:  [81-103] 81 (02/05 1430) Resp:  [16-26] 22 (02/05 1445) BP: (103-141)/(54-66) 103/60 (02/05 1430) SpO2:  [94 %-96 %] 94 % (02/05 1430) Weight:  [51.7 kg] 51.7 kg (02/05 1221)   General:   Pleasant, cooperative in NAD Head:  Normocephalic and atraumatic. Eyes:   No icterus.   Conjunctiva pink. PERRLA. Ears:  Normal auditory acuity. Neck:  Supple; no masses or thyroidomegaly Lungs: Respirations even and unlabored. Lungs clear to auscultation bilaterally.   No wheezes, crackles, or rhonchi.  Heart:  Regular rate and rhythm;  Without murmur, clicks, rubs or gallops Abdomen:  Soft, nondistended, nontender. Normal bowel sounds. No appreciable masses or hepatomegaly.  No rebound or guarding.  Neurologic:  Alert and oriented x3;  grossly normal neurologically. Skin:  Intact without significant lesions or rashes. Cervical Nodes:  No significant cervical adenopathy. Psych:  Alert and cooperative. Normal affect.  LAB RESULTS: Recent Labs    12/22/23 1128  WBC 10.5  HGB 8.6*  HCT 28.2*  PLT 383   BMET Recent Labs     12/22/23 1237  NA 136  K 4.8  CL 102  CO2 24  GLUCOSE 202*  BUN 18  CREATININE 0.80  CALCIUM  9.6   LFT Recent Labs    12/22/23 1237  PROT 7.2  ALBUMIN 2.7*  AST 33  ALT 31  ALKPHOS 113  BILITOT 0.2  PT/INR No results for input(s): LABPROT, INR in the last 72 hours.  STUDIES: DG Chest Port 1 View Result Date: 12/22/2023 CLINICAL DATA:  Shortness of breath. EXAM: PORTABLE CHEST 1 VIEW COMPARISON:  Chest radiograph dated 12/07/2023. FINDINGS: Bilateral pulmonary opacities involving the right upper lung field and left lung. There is interval progression of right upper lung field opacity since the prior radiograph and CT. Small left pleural effusion. No pneumothorax. Top-normal cardiac silhouette. Atherosclerotic calcification of the aorta. No acute osseous pathology. Represent IMPRESSION: 1. Interval progression of right upper lung field opacity. 2. Small left pleural effusion. Electronically Signed   By: Vanetta Chou M.D.   On: 12/22/2023 14:51      Impression / Plan:   [name] is a 89 y.o. y/o female with a history of stage I colon cancer s/p right hemicolectomy, subsequent colonoscopy by Dr. Aundria in 2024 showed no recurrence.  Diverticular bleed back in November 2024.  EGD at that time showed no signs of a bleed colonoscopy was also performed at the same time.  She has had a recent pulmonary embolism in January 2025 on Eliquis  with an IVC filter.  Follows with Dr. Jacobo for anemia and lung mass presumed to be malignant.  Referred to the emergency room earlier today for history of melena ongoing for a few days drop in hemoglobin from a baseline of 11 g to around 8 g.  No elevation in BUN/creatinine ratio.  Eliquis  has been held.  The patient is also being treated for pneumonia.Long discussion with family . Patient wouldn't like resus if her heart stopped and doesn't want to be intubated   The patient could have had an upper GI bleed or a small bowel bleed or a  right-sided colonic bleed.  Obviously on Eliquis  makes it higher probability to bleed.  Unfortunately with the recent pneumonia and a recent pulm embolism and her age any procedure involving anesthesia would be at a much higher risk.  At this point of time would recommend to monitor CBC and transfuse as needed , any endoscopy intervention would be needed to be performed at least 2 days after Eliquis  has been held.  At that point of time we would need to reevaluate the risk of anesthesia based on how her pneumonia evolves and how she responds to blood transfusion. Family is aware of the overall risks and possibly best option is a conservation approach avoiding endoscopy.   In the interim if she has active GI bleeding consider CT angiogram and if active bleed noted consider embolization.  IV PPI is also recommended.  Plan discussed with Dr Sherre   Thank you for involving me in the care of this patient.      LOS: 0 days   Ruel Kung, MD  12/22/2023, 3:36 PM

## 2023-12-22 NOTE — Assessment & Plan Note (Addendum)
Responded to aggressive fluid Status post sodium chloride 500 mg bolus per EDP Ordered sodium chloride 1 L bolus at a rate of 500 mL/h Recheck stat hemoglobin/hematocrit, will transfuse if less than 8

## 2023-12-22 NOTE — Assessment & Plan Note (Signed)
 -  Melatonin 5 mg nightly as needed for sleep

## 2023-12-22 NOTE — Assessment & Plan Note (Addendum)
 Check procalcitonin on admission Check MRSA PCR on admission, if positive vancomycin  can be continued Patient is status post vancomycin  one-time dose per EDP Continue with azithromycin  500 mg IV daily to complete a 5-day course Cefepime  per pharmacy in setting of immunocompromise patient Incentive spirometry, flutter valve every 2 hours while awake

## 2023-12-22 NOTE — Consult Note (Signed)
 CODE SEPSIS - PHARMACY COMMUNICATION  **Broad-spectrum antimicrobials should be administered within one hour of sepsis diagnosis**  Time Code Sepsis call or page was received: 1514  Antibiotics ordered: Cefepime , Vancomycin , Azithromycin   Time of first antibiotic administration: 1540  Additional action taken by pharmacy: N/A  If necessary, name of provider/nurse contacted: N/A    Will M. Lenon, PharmD Clinical Pharmacist 12/22/2023 3:26 PM

## 2023-12-22 NOTE — ED Notes (Signed)
 Confirmed patient on central tele

## 2023-12-22 NOTE — Progress Notes (Signed)
 SOB, cough, no appetite, weakness, and has black stools that are like putty. Has thrush on her tongue and she is using a mouthwash to help.

## 2023-12-22 NOTE — Assessment & Plan Note (Addendum)
 Baseline hemoglobin is 9.3-13.2 in the last 2 weeks

## 2023-12-22 NOTE — H&P (Addendum)
 History and Physical   [name] FMW:[id number] DOB: 07-02-1929 DOA: 12/22/2023  PCP: Wellington Curtis LABOR, FNP  Outpatient Specialists: Dr. Jacobo, medical oncology Patient coming from: Outpatient oncology clinic  I have personally briefly reviewed patient's old medical records in [facility] EMR.  Chief Concern: Melena stool  HPI: [name] is a 44 female with history of colon cancer, pulmonary nodule, history of PE on Eliquis , iron  deficiency anemia, chronic blood loss anemia, who presents to the emergency department for chief concerns of black stool.  Vitals in the ED showed temperature of 98.3, respiration rate of 16, heart rate 93, blood pressure 141/66, SpO2 of 96% on room air.  Serum sodium is 136, potassium 4.8, chloride 102, bicarb 24, BUN of 18, sCr 0.80, eGFR greater than 60, nonfasting blood glucose 202, WBC 10.5, hemoglobin 8.6, platelets of 383.  ED treatment: Protonix  80 mg IV one-time dose, azithromycin  500 mg IV one-time dose, ceftriaxone  2, IV one-time dose, sodium chloride  500 mL liter bolus, vancomycin  per pharmacy. ------------------------------ At bedside, patient able to tell me her name, age, location, current calendar year.  Patient reports that she has been having black stool for about 1 week.  She reports she last had Eliquis  dosing in the a.m.  She denies trauma to her person.  She endorses weakness and shortness of breath with exertion over the last week.  She denies nausea, vomiting, fever, chills, chest pain, abdominal pain, dysuria, hematuria.  Social history: Patient is at home.  She denies tobacco, EtOH, recreational drug use.  ROS: Constitutional: no weight change, no fever ENT/Mouth: no sore throat, no rhinorrhea Eyes: no eye pain, no vision changes Cardiovascular: no chest pain, + dyspnea,  no edema, no palpitations Respiratory: no cough, no sputum, no wheezing Gastrointestinal: no nausea, no vomiting, no diarrhea, no  constipation Genitourinary: no urinary incontinence, no dysuria, no hematuria Musculoskeletal: no arthralgias, no myalgias Skin: no skin lesions, no pruritus, Neuro: + weakness, no loss of consciousness, no syncope Psych: no anxiety, no depression, + decrease appetite Heme/Lymph: no bruising, no bleeding  ED Course: Discussed with EDP, patient requiring hospitalization for chief concerns of upper GI bleed.  Assessment/Plan  Principal Problem:   Upper GI bleed Active Problems:   Acute blood loss anemia   Acute GI bleeding   Hypotension   Colon cancer (HCC)   History of CVA (cerebrovascular accident)   Hypertension associated with diabetes (HCC)   Hyperlipidemia   Primary insomnia   Type 2 diabetes mellitus with hyperglycemia, without long-term current use of insulin  (HCC)   Lung nodule   Melena   FTT (failure to thrive) in adult   Iron  deficiency anemia due to chronic blood loss   Depression, recurrent (HCC)   Essential hypertension   Pulmonary embolism (HCC)   CAP (community acquired pneumonia)   Assessment and Plan:  * Upper GI bleed Status post type and screen per EDP Status post sodium chloride  500 mL liter bolus once, Protonix  80 mg IV once per EDP Continue with Protonix  80 mg IV twice daily ordered on admission Hold home Eliquis  on admission Gastroenterology has been consulted via secure chat PIV: Please ensure and maintain patient has 2 peripheral IV (prefer large-bore) order placed Admit to PCU  Hypotension Responded to aggressive fluid Status post sodium chloride  500 mg bolus per EDP Ordered sodium chloride  1 L bolus at a rate of 500 mL/h Recheck stat hemoglobin/hematocrit, will transfuse if less than 8  Acute blood loss anemia Baseline hemoglobin is  9.3-13.2 in the last 2 weeks Status post type and screen Maintain hemoglobin greater than 8 Recheck CBC in a.m.  Primary insomnia Melatonin 5 mg nightly as needed for sleep  Hypertension associated with  diabetes (HCC) Hydralazine  5 mg every 6 hours as needed for SBP > 175, 5 days ordered  Type 2 diabetes mellitus with hyperglycemia, without long-term current use of insulin  (HCC) Insulin  SSI with at bedtime coverage ordered  CAP (community acquired pneumonia) Check procalcitonin on admission Check MRSA PCR on admission, if positive vancomycin  can be continued Patient is status post vancomycin  one-time dose per EDP Continue with azithromycin  500 mg IV daily to complete a 5-day course Cefepime  per pharmacy in setting of immunocompromise patient Incentive spirometry, flutter valve every 2 hours while awake  Pulmonary embolism (HCC) PE diagnosed in January 2025 Status post thrombectomy and IVC filter placed on 12/07/2023 per vascular Hold Eliquis  on admission for concerns of upper GI bleed  Iron  deficiency anemia due to chronic blood loss Baseline hemoglobin is 9.3-13.2 in the last 2 weeks  Chart reviewed.   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   a.) CT chest 03/09/2023: fusiform dilatation measuring 4.3 cm   Basal cell carcinoma (BCC) of left medial cheek    a.) s/p Mohs surgery  07/13/2022 at [facility]   Bilateral carotid artery disease [facility])    a.) doppler 04/03/2007: 50-75% RICA; b.) s/p LEFT CEA with CorMatrix patch angioplasty 01/18/2015; c.) doppler 11/02/2016: 40-59% RICA; d.) doppler 01/10/2018: 1-39% RICA   Diverticulosis 08/05/2015   Eczema of both upper extremities 06/10/2021   Heart failure with preserved left ventricular function (HFpEF) (HCC)    a.) TTE 05/17/2017: EF >55%, mild LVH, mild LAE, mild panval regurg; b.) TTE 06/27/2018 : EF >55%, mod LVH, mild LAE, mild panval regurg'; G1dd; c.) TTE 12/07/2019: EF >55%, mild LVH, mild MAC, mild panval regurg; d.) TTE 01/27/2021: EF >55%, mod LVH, triv TR, mild AR/MR, G1DD   Heart murmur    History of right cataract extraction    Hyperlipidemia    Hypertension    Insomnia    a.) on BZO (temazepam ) PRN   Irritable bowel syndrome with diarrhea 04/20/2017   LBP (low back pain) 08/05/2015   Melanoma (HCC)    a.) s/p Mohs surgery 02/06/2020 - LEFT medial brow --> V to Y advancement flap   Melena    Non-small cell lung cancer (NSCLC) (HCC) 03/09/2023   a.) CT chest 03/09/2023 --> superior segment LLL --> 2.1 x 1.7 x 2.1 cm mixed ground-glass and subpleural nodule extending to pleura with irreg margins --> concerning for primary bronchogenic carcinoma rather than metastatic disease; b.) 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 (60 Gy  over 6 fractions)   OAB (overactive bladder)    PONV (postoperative nausea and vomiting)    a.) single episode following cataract surgery   PSVT (paroxysmal supraventricular tachycardia) (HCC) 02/22/2015   a.) holter 02/22/2015: 6 runs with longest lasting 10 beats   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   a.) MRI brain 04/02/2007 - subacute RIGHT pontine CVA   Stroke (HCC) 12/16/2014   a.) MRI brain 12/16/2014: punctate foci of restricted diffusion x 5 consistent with microembolic infarctions affecting the RIGHT lateral medulla, LEFT temporal lobe, 2 foci in the RIGHT thalamus, and  LEFT inferior  frontal lobe/basal ganglia --> distribution would suggest cardiac or ascending aortic source   T2DM (type 2 diabetes mellitus) (HCC)    Tubular adenoma of colon    Past Surgical History:  Procedure Laterality Date   APPENDECTOMY     BREAST BIOPSY Right    neg   BREAST SURGERY     1980's   CAROTID ENDARTERECTOMY Left 01/18/2015   Procedure: CAROTID ENDARTERECTOMY; Location: [facility]; Surgeon: Cordella Shawl, MD   CATARACT EXTRACTION Right 2012   CHOLECYSTECTOMY  06/27/2012   COLONOSCOPY N/A 10/05/2023   Procedure: COLONOSCOPY;  Surgeon: Toledo, Ladell POUR, MD;  Location: [facility] ENDOSCOPY;  Service: Gastroenterology;  Laterality: N/A;   COLONOSCOPY WITH PROPOFOL  N/A 02/17/2023   Procedure: COLONOSCOPY WITH PROPOFOL ;  Surgeon: Maryruth Ole DASEN, MD;  Location: [facility] ENDOSCOPY;  Service: Endoscopy;  Laterality: N/A;   DECOMPRESSIVE LUMBAR LAMINECTOMY LEVEL 1  1950   ESOPHAGEAL BRUSHING  10/03/2023   Procedure: ESOPHAGEAL BRUSHING;  Surgeon: Aundria, Ladell POUR, MD;  Location: [facility] ENDOSCOPY;  Service: Gastroenterology;;   ESOPHAGOGASTRODUODENOSCOPY N/A 10/03/2023   Procedure: ESOPHAGOGASTRODUODENOSCOPY (EGD);  Surgeon: Toledo, Ladell POUR, MD;  Location: [facility] ENDOSCOPY;  Service: Gastroenterology;  Laterality: N/A;   ESOPHAGOGASTRODUODENOSCOPY (EGD) WITH PROPOFOL  N/A 02/16/2023   Procedure: ESOPHAGOGASTRODUODENOSCOPY (EGD) WITH PROPOFOL ;  Surgeon: Maryruth Ole DASEN, MD;  Location: [facility] ENDOSCOPY;  Service: Endoscopy;  Laterality: N/A;   IVC FILTER INSERTION N/A 12/08/2023   Procedure: IVC FILTER INSERTION;  Surgeon: Shawl Cordella MATSU, MD;  Location: [facility] INVASIVE CV LAB;  Service: Cardiovascular;  Laterality: N/A;   MELANOMA EXCISION Left 02/06/2020   Procedure: MOHS SURGERY WITH V TO Y ADVANCEMENT FLAP (LEFT MEDIAL EYEBROW); Location: [facility]   MOHS SURGERY Left 07/13/2022   Procedure: MOHS SURGERY (BCC LEFT MEDIAL CHEEK); Location: [facility]   PULMONARY THROMBECTOMY Bilateral 12/08/2023    Procedure: PULMONARY THROMBECTOMY;  Surgeon: Shawl Cordella MATSU, MD;  Location: [facility] INVASIVE CV LAB;  Service: Cardiovascular;  Laterality: Bilateral;   Social History:  reports that she quit smoking about 47 years ago. Her smoking use included cigarettes. She has never used smokeless tobacco. She reports that she does not drink alcohol and does not use drugs.  Allergies  Allergen Reactions   Codeine    Nitrofurantoin     Other reaction(s): Other (See Comments)   Tramadol Nausea Only   Doxycycline  Rash   Penicillins Swelling, Rash and Other (See Comments)    Throat swelling    Sulfa Antibiotics Rash   Family History  Problem Relation Age of Onset   Ovarian cancer Mother    Heart disease Brother    Heart attack Brother    Gout Brother    Alzheimer's disease Brother    Dementia Brother    Heart attack Brother    Diabetes Daughter    Melanoma Daughter    Stroke Son    Breast cancer Neg Hx    Family history:  Family history reviewed and not pertinent.  Prior to Admission medications   Medication Sig Start Date End Date Taking? Authorizing Provider  apixaban  (ELIQUIS ) 2.5 MG TABS tablet Take 1 tablet (2.5 mg total) by mouth 2 (two) times daily. 12/12/23 01/11/24  Trudy Anthony HERO, MD  atorvastatin  (LIPITOR) 40 MG tablet Take 1 tablet (40 mg total) by mouth daily. 07/13/23   Emilio Kelly DASEN, FNP  cholecalciferol  (VITAMIN D ) 1000 UNITS tablet Take 1,000 Units by mouth daily.     [provider]  glipiZIDE  (GLUCOTROL  XL) 5 MG 24 hr tablet Take 1 tablet (5 mg total) by mouth daily with breakfast. 08/26/23   Emilio Kelly DASEN, FNP  pantoprazole  (PROTONIX ) 40 MG tablet Take 1 tablet (40 mg total) by mouth daily. 12/12/23 01/11/24  Trudy Anthony HERO, MD  PARoxetine  (PAXIL ) 10 MG tablet Take 1 tablet (10 mg total) by mouth daily. 10/13/23   Emilio Kelly DASEN, FNP  Polyethylene Glycol 3350  (MIRALAX  PO) Take by mouth.    [provider]  simethicone  (MYLICON) 125 MG chewable  tablet Chew 1 tablet (125 mg total) by mouth every 6 (six) hours as needed for flatulence. 11/02/23   Emilio Kelly DASEN, FNP  temazepam  (RESTORIL ) 15 MG capsule TAKE 1 CAPSULE BY MOUTH AT BEDTIME AS NEEDED FOR SLEEP 07/13/23   Emilio Kelly DASEN, FNP   Physical Exam: Vitals:   12/22/23 1500 12/22/23 1530 12/22/23 1600 12/22/23 1603  BP: (!) 113/58 (!) 114/59 118/63   Pulse: 80 89 92   Resp: (!) 26 (!) 26 19   Temp:    98.5 F (36.9 C)  TempSrc:    Oral  SpO2: 95% 97% 96%   Weight:      Height:       Constitutional: appears frail, weak, cachectic appearing Eyes: PERRL, lids and conjunctivae normal ENMT: Mucous membranes are moist. Posterior pharynx clear of any exudate or lesions. Age-appropriate dentition. Hearing appropriate Neck: normal, supple, no masses, no thyromegaly Respiratory: clear to auscultation bilaterally, no wheezing, no crackles. Normal respiratory effort. No accessory muscle use.  Cardiovascular: Tachycardia with regular rhythm, no murmurs / rubs / gallops. No extremity edema. 2+ pedal pulses. No carotid bruits.  Abdomen: no tenderness, no masses palpated, no hepatosplenomegaly. Bowel sounds positive.  Musculoskeletal: no clubbing / cyanosis. No joint deformity upper and lower extremities. Good ROM, no contractures, no atrophy. Normal muscle tone.  Skin: no rashes, lesions, ulcers. No induration Neurologic: Sensation intact. Strength 5/5 in all 4.  Psychiatric: Normal judgment and insight. Alert and oriented x 3.  Depressed mood.   EKG: independently reviewed, showing sinus tachycardia with rate of 111, QTc 397  Chest x-ray on Admission: I personally reviewed and I agree with radiologist reading as below.  DG Chest Port 1 View Result Date: 12/22/2023 CLINICAL DATA:  Shortness of breath. EXAM: PORTABLE CHEST 1 VIEW COMPARISON:  Chest radiograph dated 12/07/2023. FINDINGS: Bilateral pulmonary opacities involving the right upper lung field and left lung. There is interval  progression of right upper lung field opacity since the prior radiograph and CT. Small left pleural effusion. No pneumothorax. Top-normal cardiac silhouette. Atherosclerotic calcification of the aorta. No acute osseous pathology. Represent IMPRESSION: 1. Interval progression of right upper lung field opacity. 2. Small left pleural effusion. Electronically Signed   By: Vanetta Chou M.D.   On: 12/22/2023 14:51   Labs on Admission: I have personally reviewed following labs  CBC: Recent Labs  Lab 12/22/23 1128  WBC 10.5  NEUTROABS 8.2*  HGB 8.6*  HCT 28.2*  MCV 86.8  PLT 383   Basic Metabolic Panel: Recent Labs  Lab 12/22/23 1237  NA 136  K 4.8  CL 102  CO2 24  GLUCOSE 202*  BUN 18  CREATININE 0.80  CALCIUM  9.6   GFR: Estimated Creatinine Clearance: 35.5 mL/min (by C-G formula based on SCr of 0.8 mg/dL). Liver Function Tests: Recent Labs  Lab 12/22/23 1237  AST 33  ALT 31  ALKPHOS 113  BILITOT 0.2  PROT 7.2  ALBUMIN 2.7*   Urine analysis:    Component Value Date/Time   COLORURINE YELLOW (A) 12/08/2023 0721   APPEARANCEUR CLOUDY (A) 12/08/2023 0721   APPEARANCEUR Clear 06/13/2021 1019   LABSPEC >1.046 (H) 12/08/2023 0721   LABSPEC 1.010 12/15/2014 1926   PHURINE 5.0 12/08/2023 0721   GLUCOSEU NEGATIVE 12/08/2023 0721   GLUCOSEU NEGATIVE 12/15/2014 1926   HGBUR NEGATIVE 12/08/2023 0721   BILIRUBINUR NEGATIVE 12/08/2023 0721   BILIRUBINUR Negative 06/13/2021 1019   BILIRUBINUR NEGATIVE 12/15/2014 1926   KETONESUR NEGATIVE 12/08/2023 0721   PROTEINUR 30 (A) 12/08/2023 0721   NITRITE NEGATIVE 12/08/2023 0721   LEUKOCYTESUR LARGE (A) 12/08/2023 0721   LEUKOCYTESUR NEGATIVE 12/15/2014 1926   CRITICAL CARE Performed by: Dr. Sherre  Total critical care time: 32 minutes  Critical care time was exclusive of separately billable procedures and treating other patients.  Critical care was necessary to treat or prevent imminent or life-threatening  deterioration.  Critical care was time spent personally by me on the following activities: development of treatment plan with patient and/or surrogate as well as nursing, discussions with consultants, evaluation of patient's response to treatment, examination of patient, obtaining history from patient or surrogate, ordering and performing treatments and interventions, ordering and review of laboratory studies, ordering and review of radiographic studies, pulse oximetry and re-evaluation of patient's condition.  This document was prepared using Dragon Voice Recognition software and may include unintentional dictation errors.  Dr. Sherre [facility]  If 7PM-7AM, please contact overnight-coverage provider If 7AM-7PM, please contact day attending provider [email or web address]  12/22/2023, 5:38 PM

## 2023-12-22 NOTE — ED Triage Notes (Signed)
EKG shows STEMI. Pt taken to room 17.

## 2023-12-22 NOTE — ED Notes (Signed)
 Up to bathroom with assistance.

## 2023-12-22 NOTE — Assessment & Plan Note (Signed)
 Insulin SSI with at bedtime coverage ordered

## 2023-12-22 NOTE — Assessment & Plan Note (Signed)
 PE diagnosed in January 2025 Status post thrombectomy and IVC filter placed on 12/07/2023 per vascular Hold Eliquis  on admission for concerns of upper GI bleed

## 2023-12-22 NOTE — Progress Notes (Addendum)
 Repeat hemoglobin is 6.7/hematocrit 21.9.  Patient is status post type and screen per EDP.  # Symptomatic anemia, acute on chronic anemia in setting of melena stool, concerns for upper GI bleed - Patient's last dose of Eliquis  was this morning, Eliquis  has been held on admission - 2 units PRBC ordered and 1 unit ordered for transfusion - Goal hemoglobin greater than 8 - Nursing instruction: Please recheck hemoglobin/hematocrit after 1 unit of blood transfusion has been given - Discussed with nursing via secure chat - Cross cover aware  Addendum: updated daughter, [name] at [phone].   Dr. Sherre

## 2023-12-22 NOTE — ED Triage Notes (Signed)
 Pt here with rectal bleeding from the cancer center, hx of colon cancer. Pt states stool was black and her labs have dropped significantly in 1 week. Pt denies abd pain but has a PE. Pt endorses SOB as well.

## 2023-12-23 ENCOUNTER — Encounter: Payer: Self-pay | Admitting: Internal Medicine

## 2023-12-23 DIAGNOSIS — K922 Gastrointestinal hemorrhage, unspecified: Secondary | ICD-10-CM

## 2023-12-23 DIAGNOSIS — I350 Nonrheumatic aortic (valve) stenosis: Secondary | ICD-10-CM

## 2023-12-23 DIAGNOSIS — E1159 Type 2 diabetes mellitus with other circulatory complications: Secondary | ICD-10-CM | POA: Diagnosis not present

## 2023-12-23 DIAGNOSIS — G4700 Insomnia, unspecified: Secondary | ICD-10-CM

## 2023-12-23 DIAGNOSIS — I700 Atherosclerosis of aorta: Secondary | ICD-10-CM

## 2023-12-23 DIAGNOSIS — Z48812 Encounter for surgical aftercare following surgery on the circulatory system: Secondary | ICD-10-CM | POA: Diagnosis not present

## 2023-12-23 DIAGNOSIS — I152 Hypertension secondary to endocrine disorders: Secondary | ICD-10-CM | POA: Diagnosis not present

## 2023-12-23 DIAGNOSIS — I5030 Unspecified diastolic (congestive) heart failure: Secondary | ICD-10-CM | POA: Diagnosis not present

## 2023-12-23 DIAGNOSIS — I471 Supraventricular tachycardia, unspecified: Secondary | ICD-10-CM

## 2023-12-23 DIAGNOSIS — D649 Anemia, unspecified: Secondary | ICD-10-CM

## 2023-12-23 DIAGNOSIS — J189 Pneumonia, unspecified organism: Secondary | ICD-10-CM

## 2023-12-23 DIAGNOSIS — F339 Major depressive disorder, recurrent, unspecified: Secondary | ICD-10-CM

## 2023-12-23 LAB — HEMOGLOBIN AND HEMATOCRIT, BLOOD
HCT: 25.7 % — ABNORMAL LOW (ref 36.0–46.0)
HCT: 26.1 % — ABNORMAL LOW (ref 36.0–46.0)
Hemoglobin: 8.3 g/dL — ABNORMAL LOW (ref 12.0–15.0)
Hemoglobin: 8.3 g/dL — ABNORMAL LOW (ref 12.0–15.0)

## 2023-12-23 LAB — CBG MONITORING, ED
Glucose-Capillary: 158 mg/dL — ABNORMAL HIGH (ref 70–99)
Glucose-Capillary: 133 mg/dL — ABNORMAL HIGH (ref 70–99)
Glucose-Capillary: 136 mg/dL — ABNORMAL HIGH (ref 70–99)
Glucose-Capillary: 141 mg/dL — ABNORMAL HIGH (ref 70–99)

## 2023-12-23 LAB — CBC
HCT: 26.7 % — ABNORMAL LOW (ref 36.0–46.0)
Hemoglobin: 8.4 g/dL — ABNORMAL LOW (ref 12.0–15.0)
MCH: 27.6 pg (ref 26.0–34.0)
MCHC: 31.5 g/dL (ref 30.0–36.0)
MCV: 87.8 fL (ref 80.0–100.0)
Platelets: 250 10*3/uL (ref 150–400)
RBC: 3.04 MIL/uL — ABNORMAL LOW (ref 3.87–5.11)
RDW: 17.7 % — ABNORMAL HIGH (ref 11.5–15.5)
WBC: 6.4 10*3/uL (ref 4.0–10.5)
nRBC: 0 % (ref 0.0–0.2)

## 2023-12-23 LAB — BASIC METABOLIC PANEL
Anion gap: 6 (ref 5–15)
BUN: 12 mg/dL (ref 8–23)
CO2: 22 mmol/L (ref 22–32)
Calcium: 8.1 mg/dL — ABNORMAL LOW (ref 8.9–10.3)
Chloride: 109 mmol/L (ref 98–111)
Creatinine, Ser: 0.59 mg/dL (ref 0.44–1.00)
GFR, Estimated: >60 mL/min (ref >60)
Glucose, Bld: 110 mg/dL — ABNORMAL HIGH (ref 70–99)
Potassium: 4 mmol/L (ref 3.5–5.1)
Sodium: 137 mmol/L (ref 135–145)

## 2023-12-23 MED ORDER — CEFPODOXIME PROXETIL 200 MG PO TABS
200.0000 mg | ORAL_TABLET | Freq: Two times a day (BID) | ORAL | Status: DC
  Administered 2023-12-23 – 2023-12-26 (×7): 200 mg via ORAL
  Filled 2023-12-23 (×8): qty 1

## 2023-12-23 MED ORDER — TRAZODONE HCL 100 MG PO TABS
100.0000 mg | ORAL_TABLET | Freq: Every day | ORAL | Status: DC
  Administered 2023-12-23 – 2023-12-27 (×5): 100 mg via ORAL
  Filled 2023-12-23 (×5): qty 1

## 2023-12-23 MED ORDER — AZITHROMYCIN 250 MG PO TABS
500.0000 mg | ORAL_TABLET | Freq: Every day | ORAL | Status: AC
  Administered 2023-12-24 – 2023-12-26 (×3): 500 mg via ORAL
  Filled 2023-12-23: qty 2
  Filled 2023-12-23: qty 1
  Filled 2023-12-23: qty 2

## 2023-12-23 MED ORDER — TRAZODONE HCL 100 MG PO TABS: 100.0000 mg | ORAL_TABLET | Freq: Every day | ORAL | Status: DC

## 2023-12-23 MED ORDER — TRAZODONE HCL 100 MG PO TABS
100.0000 mg | ORAL_TABLET | Freq: Every day | ORAL | Status: DC
Start: 2023-12-23 — End: 2023-12-23

## 2023-12-23 NOTE — ED Notes (Signed)
 Hospital bed requested to increase comfort.

## 2023-12-23 NOTE — ED Notes (Signed)
 This RN assisted pt on to bed pan. Pt had one small BM. Peri care provided, new brief and chuck applied. Pt tolerated well, Call light within reach. NAD

## 2023-12-23 NOTE — Progress Notes (Signed)
 Whitney Copping, MD [facility]   [address], [address] [place], [place] [address] Phone: [phone] Fax : [phone]   Subjective: The patient was seen and examined and is in no apparent distress.  The patient is resting comfortably.  There has been no sign of further GI bleeding.  The patient's hemoglobin has been stable.   Objective: Vital signs in last 24 hours: Vitals:   12/23/23 1100 12/23/23 1315 12/23/23 1545 12/23/23 1615  BP: 118/62 112/89 119/60 117/65  Pulse: 80 87 83 78  Resp: (!) 25 (!) 24 17   Temp:      TempSrc:      SpO2: 93% 95% 94% 94%  Weight:      Height:       Weight change:   Intake/Output Summary (Last 24 hours) at 12/23/2023 1631 Last data filed at 12/23/2023 1041 Gross per 24 hour  Intake 560 ml  Output --  Net 560 ml     Exam: Heart:: Regular rate and rhythm or without murmur or extra heart sounds Lungs: normal and clear to auscultation and percussion Abdomen: soft, nontender, normal bowel sounds   Lab Results: @LABTEST2 @ Micro Results: Recent Results (from the past 240 hours)  Blood Culture (routine x 2)     Status: None (Preliminary result)   Collection Time: 12/22/23 12:37 PM   Specimen: BLOOD  Result Value Ref Range Status   Specimen Description BLOOD BLOOD RIGHT FOREARM  Final   Special Requests   Final    BOTTLES DRAWN AEROBIC AND ANAEROBIC Blood Culture adequate volume   Culture   Final    NO GROWTH < 24 HOURS Performed at [facility], [address] [place], [place] [address]    Report Status PENDING  Incomplete  Blood Culture (routine x 2)     Status: None (Preliminary result)   Collection Time: 12/22/23 12:37 PM   Specimen: BLOOD  Result Value Ref Range Status   Specimen Description BLOOD LEFT ANTECUBITAL  Final   Special Requests   Final    BOTTLES DRAWN AEROBIC AND ANAEROBIC Blood Culture adequate volume   Culture   Final    NO GROWTH < 24 HOURS Performed at [facility], [address]  [place], [place] [address]    Report Status PENDING  Incomplete  MRSA Next Gen by PCR, Nasal     Status: None   Collection Time: 12/22/23  3:43 PM   Specimen: Nasal Mucosa; Nasal Swab  Result Value Ref Range Status   MRSA by PCR Next Gen NOT DETECTED NOT DETECTED Final    Comment: (NOTE) The GeneXpert MRSA Assay (FDA approved for NASAL specimens only), is one component of a comprehensive MRSA colonization surveillance program. It is not intended to diagnose MRSA infection nor to guide or monitor treatment for MRSA infections. Test performance is not FDA approved in patients less than 34 years old. Performed at [facility], [address], [place], [place] [address]    Studies/Results: [facility] Chest Port 1 View Result Date: 12/22/2023 CLINICAL DATA:  Shortness of breath. EXAM: PORTABLE CHEST 1 VIEW COMPARISON:  Chest radiograph dated 12/07/2023. FINDINGS: Bilateral pulmonary opacities involving the right upper lung field and left lung. There is interval progression of right upper lung field opacity since the prior radiograph and CT. Small left pleural effusion. No pneumothorax. Top-normal cardiac silhouette. Atherosclerotic calcification of the aorta. No acute osseous pathology. Represent IMPRESSION: 1. Interval progression of right upper lung field opacity. 2. Small left  pleural effusion. Electronically Signed   By: Vanetta Chou M.D.   On: 12/22/2023 14:51   Medications: I have reviewed the patient's current medications. Scheduled Meds:  [START ON 12/24/2023] azithromycin   500 mg Oral Daily   cefpodoxime   200 mg Oral Q12H   insulin  aspart  0-15 Units Subcutaneous TID WC   insulin  aspart  0-5 Units Subcutaneous QHS   pantoprazole  (PROTONIX ) IV  80 mg Intravenous BID   traZODone   100 mg Oral QHS   Continuous Infusions: PRN Meds:.acetaminophen  **OR** acetaminophen , hydrALAZINE , melatonin, ondansetron  **OR** ondansetron  (ZOFRAN ) IV   Assessment: Principal Problem:   Upper GI  bleed Active Problems:   History of CVA (cerebrovascular accident)   Melena   Acute blood loss anemia   Colon cancer (HCC)   Hypertension associated with diabetes (HCC)   Hyperlipidemia   Type 2 diabetes mellitus with hyperglycemia, without long-term current use of insulin  (HCC)   Primary insomnia   Acute GI bleeding   Lung nodule   FTT (failure to thrive) in adult   Iron  deficiency anemia due to chronic blood loss   Depression, recurrent (HCC)   Essential hypertension   Pulmonary embolism (HCC)   CAP (community acquired pneumonia)   Hypotension    Plan: With this patient having an advanced age with no further sign of GI bleeding and recent Eliquis  no procedure is being planned in the near future.  It was recommended that since the patient had a recent pneumonia and a pulmonary embolism procedures involving anesthesia would be a high risk.  GI will continue to follow along with you while the patient is in the hospital.   LOS: 1 day   Whitney Copping, MD.FACG 12/23/2023, 4:31 PM Pager [phone] 7am-5pm  Check AMION for 5pm -7am coverage and on weekends

## 2023-12-23 NOTE — ED Notes (Signed)
 Pt placed on bedpan by this RN. Pt had one large episode of stool. Pericare and new brief applied. Pt tolerated well and was repositioned per request. Daughter is at bedside. Call light within reach.

## 2023-12-23 NOTE — Progress Notes (Signed)
 PROGRESS NOTE    [name]   FMW:[id number] DOB: [birth year]  DOA: 12/22/2023 Date of Service: 12/23/23 which is hospital day 1  PCP: Wellington Curtis LABOR, [facility] course / significant events:   HPI: [name] is a 89 female with history of colon cancer, pulmonary nodule presumed malignant, history of PE on Eliquis , iron  deficiency anemia, GIB 09/2023 diverticular, chronic blood loss anemia, who presents to the emergency department for chief concerns of black stool x1 week, last Eliquis  taken AM 02/05. Was seen by Dr. Jacobo earlier 02/05 and sent to ED.   Of note, recent pulm embolism in January 2025, initially dx on CT outpatient done by oncology 01/07, at that time hesitant to anticoagulate given GIB Hx and referred to vascular surgery, initial plan was for IVC filter placement 01/28. However prior to that she was admitted at [facility] 01/21 w/ worsening thrombus burden compared to prior CT 01/07, therefore underwent thrombectomy and IVC placement inpatient on 01/22, was discharged from the hospital on 01/26. Has been on Eliquis  since then.    Relevant Hx GI - 10/05/2023 colonoscopy by Dr. Aundria showed end-to-side ileocolonic anastomosis in the ascending colon diverticulosis of the colon. At that time she was admitted with melena and it was felt that the bleeding might have been diverticular  02/05: in ED, Hgb 8.6 on admission (approx 11:30), dropping to 6.7 approx 19:00, transfusion ordered. Also tx for CAP. GI recommendations: high risk for anesthesia, monitor CBC / transfuse as needed, hold off on scope until at least 48h out from last Eliquis  dose, consider CTA w/ IR in interim if worsening bleeding.  02/06: Hgb to 8.4 at 05:00 --> 8.3 at 12:00 - stable     Consultants:  Gastroenterology   Procedures/Surgeries: none      ASSESSMENT & PLAN:   Upper GI bleed ABLA d/t GIB S/p 2 units PRBC transfusion on 02/05 High risk for anesthesia Protonix  80 mg IV  bid Hold home Eliquis   Gastroenterology following monitor CBC / transfuse as needed hold off on scope until at least 48h out from last Eliquis  dose consider CTA w/ IR if worsening bleeding 2 peripheral IV (prefer large-bore) order placed Closely monitor HH/CBC   Hypotension d/t hypovolemia d/t GIB Responded to aggressive fluid and PRBC Hold any antihypertensives GIB tx as above    Pulmonary embolism diagnosed in January 2025 Status post thrombectomy and IVC filter  Hold Eliquis  on admission for concerns of upper GI bleed  Primary insomnia Melatonin 5 mg nightly as needed for sleep   Hypertension associated with diabetes (HCC) Hydralazine  5 mg every 6 hours as needed for SBP > 175, 5 days ordered   Type 2 diabetes mellitus with hyperglycemia, without long-term current use of insulin  (HCC) Insulin  SSI with at bedtime coverage ordered   Query CAP (community acquired pneumonia) Procalcitonin low Deescalate abx to po, given equivocal imaging and (+)significant coughing will treat for CAP   Iron  deficiency anemia due to chronic blood loss Baseline hemoglobin is 9.3-13.2 in the last 2 weeks See above re ABLA    No active concerns based on BMI: Body mass index is 20.85 kg/m.  However given cancer hx and general debility, there is likely component of malnutrition  Underweight - under 18  overweight - 25 to 29 obese - 30 or more Class 1 obesity: BMI of 30.0 to 34 Class 2 obesity: BMI of 35.0 to 39 Class 3 obesity: BMI of 40.0 to 49 Super Morbid  Obesity: BMI 50-59 Super-super Morbid Obesity: BMI 60+ Significantly low or high BMI is associated with higher medical risk.  Weight management advised as adjunct to other disease management and risk reduction treatments    DVT prophylaxis: SCD IV fluids: holding continuous IV fluids  Nutrition: CLD - anticipate may need emergent EGD/scope Central lines / invasive devices: none  Code Status: DNR ACP documentation reviewed:   none on file in [facility]  TOC needs: TBD, pend PT/OT eval  Barriers to dispo / significant pending items: active tx the above, estimated medical readiness pending clinical course but anticipate will be here thru the weekend              Subjective / Brief ROS:  Patient reports no concerns other than coughing Denies CP/SOB.  Pain controlled.  Tolerating clears.  Reports no concerns w/ urination/defecation.   Family Communication: family at bedisdeon on rounds     Objective Findings:  Vitals:   12/23/23 0830 12/23/23 1041 12/23/23 1100 12/23/23 1315  BP: 106/72  118/62 112/89  Pulse: 88  80 87  Resp: 19  (!) 25 (!) 24  Temp:  98.4 F (36.9 C)    TempSrc:  Oral    SpO2: 92%  93% 95%  Weight:      Height:        Intake/Output Summary (Last 24 hours) at 12/23/2023 1446 Last data filed at 12/23/2023 1041 Gross per 24 hour  Intake 560 ml  Output --  Net 560 ml   Filed Weights   12/22/23 1221  Weight: 51.7 kg    Examination:  Physical Exam       Scheduled Medications:   [START ON 12/24/2023] azithromycin   500 mg Oral Daily   cefpodoxime   200 mg Oral Q12H   insulin  aspart  0-15 Units Subcutaneous TID WC   insulin  aspart  0-5 Units Subcutaneous QHS   pantoprazole  (PROTONIX ) IV  80 mg Intravenous BID   traZODone   100 mg Oral QHS    Continuous Infusions:    PRN Medications:  acetaminophen  **OR** acetaminophen , hydrALAZINE , melatonin, ondansetron  **OR** ondansetron  (ZOFRAN ) IV  Antimicrobials from admission:  Anti-infectives (From admission, onward)    Start     Dose/Rate Route Frequency Ordered Stop   12/24/23 1000  azithromycin  (ZITHROMAX ) tablet 500 mg        500 mg Oral Daily 12/23/23 0736 12/27/23 0959   12/23/23 2000  cefpodoxime  (VANTIN ) 100 MG/5ML suspension 200 mg        200 mg Oral Every 12 hours 12/23/23 1446     12/23/23 1000  azithromycin  (ZITHROMAX ) 500 mg in sodium chloride  0.9 % 250 mL IVPB        500 mg 250 mL/hr over 60 Minutes  Intravenous Every 24 hours 12/22/23 1543 12/23/23 1041   12/23/23 0400  ceFEPIme  (MAXIPIME ) 2 g in sodium chloride  0.9 % 100 mL IVPB  Status:  Discontinued        2 g 200 mL/hr over 30 Minutes Intravenous Every 12 hours 12/22/23 1559 12/23/23 1446   12/22/23 1515  vancomycin  (VANCOCIN ) IVPB 1000 mg/200 mL premix        1,000 mg 200 mL/hr over 60 Minutes Intravenous  Once 12/22/23 1514 12/22/23 1852   12/22/23 1515  ceFEPIme  (MAXIPIME ) 2 g in sodium chloride  0.9 % 100 mL IVPB        2 g 200 mL/hr over 30 Minutes Intravenous  Once 12/22/23 1514 12/22/23 1609   12/22/23 1515  azithromycin  (ZITHROMAX ) 500  mg in sodium chloride  0.9 % 250 mL IVPB        500 mg 250 mL/hr over 60 Minutes Intravenous  Once 12/22/23 1514 12/22/23 1736           Data Reviewed:  I have personally reviewed the following...  CBC: Recent Labs  Lab 12/22/23 1128 12/22/23 1850 12/22/23 2139 12/23/23 0507 12/23/23 1212  WBC 10.5  --   --  6.4  --   NEUTROABS 8.2*  --   --   --   --   HGB 8.6* 6.7* 7.4* 8.4* 8.3*  HCT 28.2* 21.9* 23.3* 26.7* 25.7*  MCV 86.8  --   --  87.8  --   PLT 383  --   --  250  --    Basic Metabolic Panel: Recent Labs  Lab 12/22/23 1237 12/23/23 0507  NA 136 137  K 4.8 4.0  CL 102 109  CO2 24 22  GLUCOSE 202* 110*  BUN 18 12  CREATININE 0.80 0.59  CALCIUM  9.6 8.1*   GFR: Estimated Creatinine Clearance: 35.5 mL/min (by C-G formula based on SCr of 0.59 mg/dL). Liver Function Tests: Recent Labs  Lab 12/22/23 1237  AST 33  ALT 31  ALKPHOS 113  BILITOT 0.2  PROT 7.2  ALBUMIN 2.7*   No results for input(s): LIPASE, AMYLASE in the last 168 hours. No results for input(s): AMMONIA in the last 168 hours. Coagulation Profile: No results for input(s): INR, PROTIME in the last 168 hours. Cardiac Enzymes: No results for input(s): CKTOTAL, CKMB, CKMBINDEX, TROPONINI in the last 168 hours. BNP (last 3 results) No results for input(s): PROBNP in the last  8760 hours. HbA1C: No results for input(s): HGBA1C in the last 72 hours. CBG: Recent Labs  Lab 12/22/23 1613 12/22/23 2120 12/23/23 0852 12/23/23 1210  GLUCAP 103* 184* 158* 133*   Lipid Profile: No results for input(s): CHOL, HDL, LDLCALC, TRIG, CHOLHDL, LDLDIRECT in the last 72 hours. Thyroid  Function Tests: No results for input(s): TSH, T4TOTAL, FREET4, T3FREE, THYROIDAB in the last 72 hours. Anemia Panel: No results for input(s): VITAMINB12, FOLATE, FERRITIN, TIBC, IRON , RETICCTPCT in the last 72 hours. Most Recent Urinalysis On File:     Component Value Date/Time   COLORURINE YELLOW (A) 12/08/2023 0721   APPEARANCEUR CLOUDY (A) 12/08/2023 0721   APPEARANCEUR Clear 06/13/2021 1019   LABSPEC >1.046 (H) 12/08/2023 0721   LABSPEC 1.010 12/15/2014 1926   PHURINE 5.0 12/08/2023 0721   GLUCOSEU NEGATIVE 12/08/2023 0721   GLUCOSEU NEGATIVE 12/15/2014 1926   HGBUR NEGATIVE 12/08/2023 0721   BILIRUBINUR NEGATIVE 12/08/2023 0721   BILIRUBINUR Negative 06/13/2021 1019   BILIRUBINUR NEGATIVE 12/15/2014 1926   KETONESUR NEGATIVE 12/08/2023 0721   PROTEINUR 30 (A) 12/08/2023 0721   NITRITE NEGATIVE 12/08/2023 0721   LEUKOCYTESUR LARGE (A) 12/08/2023 0721   LEUKOCYTESUR NEGATIVE 12/15/2014 1926   Sepsis Labs: @LABRCNTIP (procalcitonin:4,lacticidven:4) Microbiology: Recent Results (from the past 240 hours)  Blood Culture (routine x 2)     Status: None (Preliminary result)   Collection Time: 12/22/23 12:37 PM   Specimen: BLOOD  Result Value Ref Range Status   Specimen Description BLOOD BLOOD RIGHT FOREARM  Final   Special Requests   Final    BOTTLES DRAWN AEROBIC AND ANAEROBIC Blood Culture adequate volume   Culture   Final    NO GROWTH < 24 HOURS Performed at [facility], [address], [place], [place] [address]    Report Status PENDING  Incomplete  Blood Culture (  routine x 2)     Status: None (Preliminary result)    Collection Time: 12/22/23 12:37 PM   Specimen: BLOOD  Result Value Ref Range Status   Specimen Description BLOOD LEFT ANTECUBITAL  Final   Special Requests   Final    BOTTLES DRAWN AEROBIC AND ANAEROBIC Blood Culture adequate volume   Culture   Final    NO GROWTH < 24 HOURS Performed at [facility], [address] [place], [place] [address]    Report Status PENDING  Incomplete  MRSA Next Gen by PCR, Nasal     Status: None   Collection Time: 12/22/23  3:43 PM   Specimen: Nasal Mucosa; Nasal Swab  Result Value Ref Range Status   MRSA by PCR Next Gen NOT DETECTED NOT DETECTED Final    Comment: (NOTE) The GeneXpert MRSA Assay (FDA approved for NASAL specimens only), is one component of a comprehensive MRSA colonization surveillance program. It is not intended to diagnose MRSA infection nor to guide or monitor treatment for MRSA infections. Test performance is not FDA approved in patients less than 37 years old. Performed at [facility], [address] [place], [place] [address]       Radiology Studies last 3 days: [facility] Chest [facility] 1 View Result Date: 12/22/2023 CLINICAL DATA:  Shortness of breath. EXAM: PORTABLE CHEST 1 VIEW COMPARISON:  Chest radiograph dated 12/07/2023. FINDINGS: Bilateral pulmonary opacities involving the right upper lung field and left lung. There is interval progression of right upper lung field opacity since the prior radiograph and CT. Small left pleural effusion. No pneumothorax. Top-normal cardiac silhouette. Atherosclerotic calcification of the aorta. No acute osseous pathology. Represent IMPRESSION: 1. Interval progression of right upper lung field opacity. 2. Small left pleural effusion. Electronically Signed   By: Vanetta Chou M.D.   On: 12/22/2023 14:51       Time spent: 50 min     Tanecia Mccay, DO [facility] 12/23/2023, 2:46 PM    Dictation software may have been used to generate the above note. Typos may  occur and escape review in typed/dictated notes. Please contact Dr Marsa directly for clarity if needed.  Staff may message me via secure chat in Epic  but this may not receive an immediate response,  please page me for urgent matters!  If 7PM-7AM, please contact night coverage [email or web address]

## 2023-12-23 NOTE — ED Notes (Signed)
 Pt assisted onto bedpan.

## 2023-12-23 NOTE — TOC CM/SW Note (Signed)
 Transition of Care [name]) - Inpatient Brief Assessment   Patient Details  Name: [name] MRN: [id number] Date of Birth: 11/20/1928  [facility]) CM/SW Contact:    Silvano Molt, LCSW Phone Number: 12/23/2023, 8:50 AM   Clinical Narrative:  CSW conversed with pt's DIL, Clarita, to complete HRRA.   Clarita reports pt lives home alone, but does have 24/7 care between pt's dtr and DIL. Pt receives PT/OT with [facility]. DME used: Bedside commode, grab bars, walker, and cane. Pharmacy: [facility]. Clarita states pt's son passed on 16-Dec-2023 and pt has been grieving his passing, further reporting pt is very depressed and currently on Paxil , prescribed by PCP. Clarita does not believe current dosage is working and PCP referred out to a behavioral health specialist. They have not been able to schedule appointment due to pt's recent hospitalizations and passing of her son. Clarita states pt's mobility is extremely impaired at this point and request pt to be re-evaluated by PT to see if pt meets SNF LOC. CSW messaged attending asking for PT/OT orders. Please consult TOC for any TOC needs.   Transition of Care Asessment: Insurance and Status: Insurance coverage has been reviewed Patient has primary care physician: Yes Home environment has been reviewed: Pt lives home alone, but has 24-7 care between her daughter and her DIL Prior level of function:: Living independently w/ DME Prior/Current Home Services: Current home services (PT/OT w/ [facility]) Social Drivers of Health Review: SDOH reviewed no interventions necessary Readmission risk has been reviewed: Yes Transition of care needs: transition of care needs identified, TOC will continue to follow

## 2023-12-23 NOTE — Progress Notes (Signed)
   12/23/23 0845  Readmission Prevention Plan - High Risk  Transportation Screening Complete  PCP or Specialist appointment within 5-7 days of discharge Complete (Nurse secretary will schedule at time of d/c)  Home Care Consult (High Risk) Complete (Pt with [facility])  High Risk Social Work Consult for recovery care planning/counseling (includes patient and caregiver) Complete  High Risk Palliative Care Screening Not Applicable  Medication Review Complete   HRRA completed w/ pt's daughter in law, [place].

## 2023-12-23 NOTE — ED Notes (Signed)
 Pt placed in hospital bed to increase comfort. Denies any other needs at this time. CB within reach. Bed alarm set.  Pt encouraged to alert staff to any needs.

## 2023-12-23 NOTE — ED Notes (Signed)
 Called to verify patient is on CCMD monitoring

## 2023-12-24 ENCOUNTER — Telehealth: Payer: Self-pay | Admitting: Family Medicine

## 2023-12-24 DIAGNOSIS — K922 Gastrointestinal hemorrhage, unspecified: Secondary | ICD-10-CM | POA: Diagnosis not present

## 2023-12-24 LAB — CBC WITH DIFFERENTIAL/PLATELET
Abs Immature Granulocytes: 0.02 10*3/uL (ref 0.00–0.07)
Basophils Absolute: 0 10*3/uL (ref 0.0–0.1)
Basophils Relative: 1 %
Eosinophils Absolute: 0.2 10*3/uL (ref 0.0–0.5)
Eosinophils Relative: 4 %
HCT: 26.7 % — ABNORMAL LOW (ref 36.0–46.0)
Hemoglobin: 8.3 g/dL — ABNORMAL LOW (ref 12.0–15.0)
Immature Granulocytes: 0 %
Lymphocytes Relative: 19 %
Lymphs Abs: 1.1 10*3/uL (ref 0.7–4.0)
MCH: 27.4 pg (ref 26.0–34.0)
MCHC: 31.1 g/dL (ref 30.0–36.0)
MCV: 88.1 fL (ref 80.0–100.0)
Monocytes Absolute: 0.4 10*3/uL (ref 0.1–1.0)
Monocytes Relative: 7 %
Neutro Abs: 4 10*3/uL (ref 1.7–7.7)
Neutrophils Relative %: 69 %
Platelets: 290 10*3/uL (ref 150–400)
RBC: 3.03 MIL/uL — ABNORMAL LOW (ref 3.87–5.11)
RDW: 17.8 % — ABNORMAL HIGH (ref 11.5–15.5)
WBC: 5.8 10*3/uL (ref 4.0–10.5)
nRBC: 0 % (ref 0.0–0.2)

## 2023-12-24 LAB — URINALYSIS, W/ REFLEX TO CULTURE (INFECTION SUSPECTED)
Bilirubin Urine: NEGATIVE
Glucose, UA: >=500 mg/dL — AB
Ketones, ur: NEGATIVE mg/dL
Nitrite: NEGATIVE
Protein, ur: NEGATIVE mg/dL
Specific Gravity, Urine: 1.017 (ref 1.005–1.030)
pH: 5 (ref 5.0–8.0)

## 2023-12-24 LAB — BASIC METABOLIC PANEL
Anion gap: 12 (ref 5–15)
BUN: 8 mg/dL (ref 8–23)
CO2: 21 mmol/L — ABNORMAL LOW (ref 22–32)
Calcium: 8.5 mg/dL — ABNORMAL LOW (ref 8.9–10.3)
Chloride: 105 mmol/L (ref 98–111)
Creatinine, Ser: 0.7 mg/dL (ref 0.44–1.00)
GFR, Estimated: >60 mL/min (ref >60)
Glucose, Bld: 201 mg/dL — ABNORMAL HIGH (ref 70–99)
Potassium: 3.3 mmol/L — ABNORMAL LOW (ref 3.5–5.1)
Sodium: 138 mmol/L (ref 135–145)

## 2023-12-24 LAB — HEMOGLOBIN AND HEMATOCRIT, BLOOD
HCT: 26 % — ABNORMAL LOW (ref 36.0–46.0)
HCT: 24.5 % — ABNORMAL LOW (ref 36.0–46.0)
Hemoglobin: 8.4 g/dL — ABNORMAL LOW (ref 12.0–15.0)
Hemoglobin: 7.9 g/dL — ABNORMAL LOW (ref 12.0–15.0)

## 2023-12-24 LAB — CBG MONITORING, ED: Glucose-Capillary: 136 mg/dL — ABNORMAL HIGH (ref 70–99)

## 2023-12-24 MED ORDER — POTASSIUM CHLORIDE CRYS ER 10 MEQ PO TBCR
40.0000 meq | EXTENDED_RELEASE_TABLET | Freq: Two times a day (BID) | ORAL | Status: AC
  Administered 2023-12-24 – 2023-12-25 (×2): 40 meq via ORAL
  Filled 2023-12-24 (×2): qty 4

## 2023-12-24 MED ORDER — DEXTROSE 50 % IV SOLN
1.0000 | Freq: Once | INTRAVENOUS | Status: AC
  Administered 2023-12-24: 50 mL via INTRAVENOUS
  Filled 2023-12-24: qty 50

## 2023-12-24 MED ORDER — POTASSIUM CHLORIDE 20 MEQ PO PACK
40.0000 meq | PACK | Freq: Two times a day (BID) | ORAL | Status: DC
  Administered 2023-12-24: 40 meq via ORAL
  Filled 2023-12-24: qty 2

## 2023-12-24 NOTE — Evaluation (Signed)
 Occupational Therapy Evaluation Patient Details Name: [name] MRN: [id number] DOB: 05/30/1929 Today's Date: 12/24/2023   History of Present Illness Pt is a 89 y.o. female presenting to hospital 12/22/23 for evaluation of GI bleeding.  Recent hospitalization with PE, DVT B LE's, and GI bleeding; s/p mechanical thrombectomy and IVC filter placement 12/08/23.  Pt admitted with upper GI bleed, hypotension, acute blood loss anemia, CAP, iron  deficiency anemia. PMH includes colon CA (s/p partial colectomy), htn, HLD, DM, diastolic CHF, stroke, B carotid artery stenosis, PSVT, IBS, L CEA, h/o GI bleed.   Clinical Impression   Pt was seen for OT evaluation this date. Prior to hospital admission, pt was living at home alone with her daughter and daughter in law coming in on a rotating schedule providing 24/7 supervision/assistance. Pt with 6 hospitalizations in less than a year and reports continued weakness.  Pt presents to acute OT demonstrating impaired ADL performance and functional mobility 2/2 weakness and low activity tolerance (See OT problem list for additional functional deficits). Pt currently requires SUP for bed mobility with increased time/effort. BSC retrieved and adjusted to proper height and pt performed BSC transfer via SPT with Min A. Hygiene performed seated with SUP. Brief management required Mod/Max. Pt able to utilize RW for lateral steps to get to [facility] with MIN/CGA for safety before pt fatigued and wished to go back to bed. HR increased to 105, sp02 lowest to 90%, RR up to low 30s. Pt demo LB dressing to doff/don socks at EOB via figure 4 with CGA. Pt would benefit from skilled OT services to address noted impairments and functional limitations (see below for any additional details) in order to maximize safety and independence while minimizing falls risk and caregiver burden. Do anticipate the need for follow up OT services upon acute hospital DC.        If plan is discharge home,  recommend the following: A little help with walking and/or transfers;A little help with bathing/dressing/bathroom;Assistance with cooking/housework;Direct supervision/assist for medications management;Direct supervision/assist for financial management;Assist for transportation;Help with stairs or ramp for entrance    Functional Status Assessment  Patient has had a recent decline in their functional status and demonstrates the ability to make significant improvements in function in a reasonable and predictable amount of time.  Equipment Recommendations  None recommended by OT    Recommendations for Other Services       Precautions / Restrictions Precautions Precautions: Fall Restrictions Weight Bearing Restrictions Per Provider Order: No      Mobility Bed Mobility Overal bed mobility: Needs Assistance Bed Mobility: Supine to Sit, Sit to Supine     Supine to sit: Supervision, HOB elevated Sit to supine: Supervision   General bed mobility comments: increased effort to perform d/t fatigue    Transfers Overall transfer level: Needs assistance Equipment used: Rolling walker (2 wheels) Transfers: Sit to/from Stand, Bed to chair/wheelchair/BSC Sit to Stand: Contact guard assist, Min assist Stand pivot transfers: Min assist         General transfer comment: MIN/CGA for STS from EOB to RW and SPT bed<>BSC with pt able to lateral step to [facility] before return to supine      Balance Overall balance assessment: Needs assistance Sitting-balance support: No upper extremity supported, Feet supported Sitting balance-Leahy Scale: Good     Standing balance support: During functional activity, Reliant on assistive device for balance, Bilateral upper extremity supported Standing balance-Leahy Scale: Poor Standing balance comment: MIN/CGA and RW use to maintain standing balance  ADL either performed or assessed with clinical judgement   ADL Overall  ADL's : Needs assistance/impaired                         Toilet Transfer: Rolling walker (2 wheels);BSC/3in1;Minimal assistance;Stand-pivot   Toileting- Clothing Manipulation and Hygiene: Sitting/lateral lean;Moderate assistance;Maximal assistance Toileting - Clothing Manipulation Details (indicate cue type and reason): SBA to perform hygiene seated; Max/Mod A for brief management             Vision         Perception         Praxis         Pertinent Vitals/Pain Pain Assessment Pain Assessment: No/denies pain     Extremity/Trunk Assessment Upper Extremity Assessment Upper Extremity Assessment: Generalized weakness   Lower Extremity Assessment Lower Extremity Assessment: Generalized weakness   Cervical / Trunk Assessment Cervical / Trunk Assessment: Normal   Communication Communication Communication: No apparent difficulties Cueing Techniques: Verbal cues   Cognition Arousal: Alert Behavior During Therapy: WFL for tasks assessed/performed Overall Cognitive Status: Within Functional Limits for tasks assessed                                       General Comments  HR up to 104 with activity; sp02 lowest 90% with activity; mostly 95%    Exercises Other Exercises Other Exercises: Edu on role of OT in acute setting.   Shoulder Instructions      Home Living Family/patient expects to be discharged to:: Private residence Living Arrangements: Alone Available Help at Discharge: Family;Available PRN/intermittently (Daughter lives on one side and daughter in law lives on other side of pt) Type of Home: House Home Access: Level entry     Home Layout: One level     Bathroom Shower/Tub: Chief Strategy Officer: Standard     Home Equipment: Agricultural Consultant (2 wheels);Cane - single point;Grab bars - tub/shower          Prior Functioning/Environment Prior Level of Function : Needs assist             Mobility  Comments: Pt reports most recently using RW for ambulation within home d/t weakness (prior to that would use SPC or walker); no recent falls reported since last hospitalization ADLs Comments: Sits in bathtub for bathing 2 weeks ago, since then has not been able to do. Uses BSC beside bed for toileting.  Assist with ADL's (meals, medications, hygiene, dressing--pt tends to stay in pajamas since she doesn't go out much).        OT Problem List: Cardiopulmonary status limiting activity;Impaired balance (sitting and/or standing);Decreased activity tolerance;Decreased strength      OT Treatment/Interventions: Self-care/ADL training;Therapeutic exercise;Energy conservation;DME and/or AE instruction;Therapeutic activities;Patient/family education;Balance training    OT Goals(Current goals can be found in the care plan section) Acute Rehab OT Goals Patient Stated Goal: feel better OT Goal Formulation: With patient/family Time For Goal Achievement: 01/07/24 Potential to Achieve Goals: Fair ADL Goals Pt Will Perform Lower Body Bathing: sitting/lateral leans;sit to/from stand;with supervision Pt Will Perform Lower Body Dressing: with supervision;sitting/lateral leans;sit to/from stand Pt Will Transfer to Toilet: with supervision;ambulating;regular height toilet;grab bars Pt Will Perform Toileting - Clothing Manipulation and hygiene: with supervision;sit to/from stand;sitting/lateral leans Additional ADL Goal #1: Pt will demo 1 learned ECS used during ADL performance 3/3 trials to maximize IND and prevent  overexertion.  OT Frequency: Min 1X/week    Co-evaluation              AM-PAC OT 6 Clicks Daily Activity     Outcome Measure Help from another person eating meals?: None Help from another person taking care of personal grooming?: A Little Help from another person toileting, which includes using toliet, bedpan, or urinal?: A Little Help from another person bathing (including washing,  rinsing, drying)?: A Little Help from another person to put on and taking off regular upper body clothing?: A Little Help from another person to put on and taking off regular lower body clothing?: A Little 6 Click Score: 19   End of Session Equipment Utilized During Treatment: Rolling walker (2 wheels) Nurse Communication: Mobility status  Activity Tolerance: Patient tolerated treatment well Patient left: with family/visitor present;in bed;with call bell/phone within reach;with bed alarm set  OT Visit Diagnosis: Unsteadiness on feet (R26.81);History of falling (Z91.81);Muscle weakness (generalized) (M62.81)                Time: 8864-8794 OT Time Calculation (min): 30 min Charges:  OT General Charges $OT Visit: 1 Visit OT Evaluation $OT Eval Moderate Complexity: 1 Mod OT Treatments $Self Care/Home Management : 8-22 mins Joanmarie Tsang, OTR/L  12/24/23, 1:03 PM  Duwaine FORBES Saupe 12/24/2023, 12:58 PM

## 2023-12-24 NOTE — ED Notes (Signed)
 Patient called out to this RN stating something doesn't feel right. I do not feel good. This RN asked if the patient could tell this RN why she may not be feeling good or if she is in pain. The patient could not communicate what was bothering her but repeatedly said something just is not right. This RN rechecked patient's vitals and VSS. This RN administered zofran  due to patient mentioning she was having nausea. Zofran  not effective. MD Marsa informed that patient was not feeling good and that the patient was acting more sluggish. This RN remains at bedside with patient.

## 2023-12-24 NOTE — Progress Notes (Signed)
 PROGRESS NOTE    [name]   FMW:[id number] DOB: April 23, 1929  DOA: 12/22/2023 Date of Service: 12/24/23 which is hospital day 2  PCP: Wellington Curtis LABOR, [facility] course / significant events:   HPI: [name] is a 89 female with history of colon cancer, pulmonary nodule presumed malignant, history of PE on Eliquis , iron  deficiency anemia, GIB 09/2023 diverticular, chronic blood loss anemia, who presents to the emergency department for chief concerns of black stool x1 week, last Eliquis  taken AM 02/05. Was seen by Dr. Jacobo earlier 02/05 and sent to ED.   Of note, recent pulm embolism in January 2025, initially dx on CT outpatient done by oncology 01/07, at that time hesitant to anticoagulate given GIB Hx and referred to vascular surgery, initial plan was for IVC filter placement 01/28. However prior to that she was admitted at [facility] 01/21 w/ worsening thrombus burden compared to prior CT 01/07, therefore underwent thrombectomy and IVC placement inpatient on 01/22, was discharged from the hospital on 01/26. Has been on Eliquis  since then.    Relevant Hx GI - 10/05/2023 colonoscopy by Dr. Aundria showed end-to-side ileocolonic anastomosis in the ascending colon diverticulosis of the colon. At that time she was admitted with melena and it was felt that the bleeding might have been diverticular  02/05: in ED, Hgb 8.6 on admission (approx 11:30), dropping to 6.7 approx 19:00, transfusion ordered. Also tx for CAP. GI recommendations: high risk for anesthesia, monitor CBC / transfuse as needed, hold off on scope until at least 48h out from last Eliquis  dose, consider CTA w/ IR in interim if worsening bleeding.  02/06: Hgb to 8.4 at 05:00 --> 8.3 at 12:00 - stable 02/07: patient feeling bad this morning after working w/ PT, nauseous, otherwise cannot elaborate on symptoms and neg ROS. Not eating much. UA concerning for UTI, she is already on 3rd gen cephalosporin for pneumonia will  continue this pend cultures.      Consultants:  Gastroenterology   Procedures/Surgeries: none      ASSESSMENT & PLAN:   Upper GI bleed ABLA d/t GIB S/p 2 units PRBC transfusion on 02/05 High risk for anesthesia Protonix  80 mg IV bid Hold home Eliquis   Gastroenterology following monitor CBC / transfuse as needed hold off on scope unless worse but she has been stable  consider CTA w/ IR if worsening bleeding 2 peripheral IV (prefer large-bore) order placed Ok to advance diet today    Hypotension d/t hypovolemia d/t GIB - resolved Responded to aggressive fluid and PRBC Hold any antihypertensives GIB tx as above    UA concerning for UTI she is already on 3rd gen cephalosporin for pneumonia will continue this pend cultures.   Pulmonary embolism diagnosed in January 2025 Status post thrombectomy and IVC filter  Hold Eliquis  on admission for concerns of upper GI bleed  Hypokalemia Replace as needed Monitor BMP  Primary insomnia Melatonin 5 mg nightly as needed for sleep   Hypertension associated with diabetes (HCC) Hydralazine  5 mg every 6 hours as needed for SBP > 175, 5 days ordered   Type 2 diabetes mellitus with hyperglycemia, without long-term current use of insulin  (HCC) Insulin  held given low po intake    Query CAP (community acquired pneumonia) Procalcitonin low Deescalate abx to po, given equivocal imaging and (+)significant coughing will treat for CAP   Iron  deficiency anemia due to chronic blood loss Baseline hemoglobin is 9.3-13.2 in the last 2 weeks See above re  ABLA    No active concerns based on BMI: Body mass index is 20.85 kg/m.  However given cancer hx and general debility, there is likely component of malnutrition  Underweight - under 18  overweight - 25 to 29 obese - 30 or more Class 1 obesity: BMI of 30.0 to 34 Class 2 obesity: BMI of 35.0 to 39 Class 3 obesity: BMI of 40.0 to 49 Super Morbid Obesity: BMI 50-59 Super-super  Morbid Obesity: BMI 60+ Significantly low or high BMI is associated with higher medical risk.  Weight management advised as adjunct to other disease management and risk reduction treatments    DVT prophylaxis: SCD IV fluids: holding continuous IV fluids  Nutrition: CLD - anticipate may need emergent EGD/scope Central lines / invasive devices: none  Code Status: DNR ACP documentation reviewed:  none on file in [facility]  TOC needs: TBD, pend PT/OT eval  Barriers to dispo / significant pending items: active tx the above, estimated medical readiness pending clinical course but anticipate will be here thru the weekend              Subjective / Brief ROS:  Patient  seen this morning following working w/ PT, she described feeling bad but only describes nausea - denies pain, denies headache or vision change, denies dizziness, denies focal weakness, denies fever/chills Reports cough is about the same Reports low appetite  Reports feeling very tired  Denies CP/SOB.  Pain controlled.  Tolerating clears.  Reports no concerns w/ urination/defecation.   Family Communication: family at bedisdeon on rounds     Objective Findings:  Vitals:   12/24/23 0849 12/24/23 1000 12/24/23 1300 12/24/23 1459  BP: 128/70 123/61 100/60 119/68  Pulse: 91 92 73 81  Resp: 17 (!) 24 18 18   Temp: 99 F (37.2 C)   97.9 F (36.6 C)  TempSrc: Oral   Oral  SpO2: 93% 95% 93% 93%  Weight:      Height:       No intake or output data in the 24 hours ending 12/24/23 1504  Filed Weights   12/22/23 1221  Weight: 51.7 kg    Examination:  Physical Exam Constitutional:      General: She is not in acute distress.    Appearance: She is ill-appearing. She is not diaphoretic.  Cardiovascular:     Rate and Rhythm: Normal rate and regular rhythm.     Heart sounds: Normal heart sounds.  Pulmonary:     Effort: Pulmonary effort is normal. No respiratory distress.     Breath sounds: Normal breath  sounds.  Abdominal:     General: Abdomen is flat. Bowel sounds are normal.     Palpations: Abdomen is soft.  Musculoskeletal:     Right lower leg: No edema.  Skin:    General: Skin is warm and dry.  Neurological:     General: No focal deficit present.     Mental Status: She is alert and oriented to person, place, and time. Mental status is at baseline.  Psychiatric:        Mood and Affect: Mood normal.        Behavior: Behavior normal.          Scheduled Medications:   azithromycin   500 mg Oral Daily   cefpodoxime   200 mg Oral Q12H   pantoprazole  (PROTONIX ) IV  80 mg Intravenous BID   potassium chloride   40 mEq Oral BID   traZODone   100 mg Oral QHS  Continuous Infusions:    PRN Medications:  acetaminophen  **OR** acetaminophen , hydrALAZINE , melatonin, ondansetron  **OR** ondansetron  (ZOFRAN ) IV  Antimicrobials from admission:  Anti-infectives (From admission, onward)    Start     Dose/Rate Route Frequency Ordered Stop   12/24/23 1000  azithromycin  (ZITHROMAX ) tablet 500 mg        500 mg Oral Daily 12/23/23 0736 12/27/23 0959   12/23/23 2000  cefpodoxime  (VANTIN ) tablet 200 mg        200 mg Oral Every 12 hours 12/23/23 1446     12/23/23 1000  azithromycin  (ZITHROMAX ) 500 mg in sodium chloride  0.9 % 250 mL IVPB        500 mg 250 mL/hr over 60 Minutes Intravenous Every 24 hours 12/22/23 1543 12/23/23 1041   12/23/23 0400  ceFEPIme  (MAXIPIME ) 2 g in sodium chloride  0.9 % 100 mL IVPB  Status:  Discontinued        2 g 200 mL/hr over 30 Minutes Intravenous Every 12 hours 12/22/23 1559 12/23/23 1446   12/22/23 1515  vancomycin  (VANCOCIN ) IVPB 1000 mg/200 mL premix        1,000 mg 200 mL/hr over 60 Minutes Intravenous  Once 12/22/23 1514 12/22/23 1852   12/22/23 1515  ceFEPIme  (MAXIPIME ) 2 g in sodium chloride  0.9 % 100 mL IVPB        2 g 200 mL/hr over 30 Minutes Intravenous  Once 12/22/23 1514 12/22/23 1609   12/22/23 1515  azithromycin  (ZITHROMAX ) 500 mg in sodium  chloride 0.9 % 250 mL IVPB        500 mg 250 mL/hr over 60 Minutes Intravenous  Once 12/22/23 1514 12/22/23 1736           Data Reviewed:  I have personally reviewed the following...  CBC: Recent Labs  Lab 12/22/23 1128 12/22/23 1850 12/23/23 0507 12/23/23 1212 12/23/23 1910 12/24/23 0316 12/24/23 1017  WBC 10.5  --  6.4  --   --   --  5.8  NEUTROABS 8.2*  --   --   --   --   --  4.0  HGB 8.6*   < > 8.4* 8.3* 8.3* 8.4* 8.3*  HCT 28.2*   < > 26.7* 25.7* 26.1* 26.0* 26.7*  MCV 86.8  --  87.8  --   --   --  88.1  PLT 383  --  250  --   --   --  290   < > = values in this interval not displayed.   Basic Metabolic Panel: Recent Labs  Lab 12/22/23 1237 12/23/23 0507 12/24/23 1017  NA 136 137 138  K 4.8 4.0 3.3*  CL 102 109 105  CO2 24 22 21*  GLUCOSE 202* 110* 201*  BUN 18 12 8   CREATININE 0.80 0.59 0.70  CALCIUM  9.6 8.1* 8.5*   GFR: Estimated Creatinine Clearance: 35.5 mL/min (by C-G formula based on SCr of 0.7 mg/dL). Liver Function Tests: Recent Labs  Lab 12/22/23 1237  AST 33  ALT 31  ALKPHOS 113  BILITOT 0.2  PROT 7.2  ALBUMIN 2.7*   No results for input(s): LIPASE, AMYLASE in the last 168 hours. No results for input(s): AMMONIA in the last 168 hours. Coagulation Profile: No results for input(s): INR, PROTIME in the last 168 hours. Cardiac Enzymes: No results for input(s): CKTOTAL, CKMB, CKMBINDEX, TROPONINI in the last 168 hours. BNP (last 3 results) No results for input(s): PROBNP in the last 8760 hours. HbA1C: No results for input(s): HGBA1C in the  last 72 hours. CBG: Recent Labs  Lab 12/23/23 0852 12/23/23 1210 12/23/23 1627 12/23/23 2132 12/24/23 0847  GLUCAP 158* 133* 136* 141* 136*   Lipid Profile: No results for input(s): CHOL, HDL, LDLCALC, TRIG, CHOLHDL, LDLDIRECT in the last 72 hours. Thyroid  Function Tests: No results for input(s): TSH, T4TOTAL, FREET4, T3FREE, THYROIDAB in the  last 72 hours. Anemia Panel: No results for input(s): VITAMINB12, FOLATE, FERRITIN, TIBC, IRON , RETICCTPCT in the last 72 hours. Most Recent Urinalysis On File:     Component Value Date/Time   COLORURINE YELLOW (A) 12/24/2023 0956   APPEARANCEUR HAZY (A) 12/24/2023 0956   APPEARANCEUR Clear 06/13/2021 1019   LABSPEC 1.017 12/24/2023 0956   LABSPEC 1.010 12/15/2014 1926   PHURINE 5.0 12/24/2023 0956   GLUCOSEU >=500 (A) 12/24/2023 0956   GLUCOSEU NEGATIVE 12/15/2014 1926   HGBUR MODERATE (A) 12/24/2023 0956   BILIRUBINUR NEGATIVE 12/24/2023 0956   BILIRUBINUR Negative 06/13/2021 1019   BILIRUBINUR NEGATIVE 12/15/2014 1926   KETONESUR NEGATIVE 12/24/2023 0956   PROTEINUR NEGATIVE 12/24/2023 0956   NITRITE NEGATIVE 12/24/2023 0956   LEUKOCYTESUR SMALL (A) 12/24/2023 0956   LEUKOCYTESUR NEGATIVE 12/15/2014 1926   Sepsis Labs: @LABRCNTIP (procalcitonin:4,lacticidven:4) Microbiology: Recent Results (from the past 240 hours)  Blood Culture (routine x 2)     Status: None (Preliminary result)   Collection Time: 12/22/23 12:37 PM   Specimen: BLOOD  Result Value Ref Range Status   Specimen Description BLOOD BLOOD RIGHT FOREARM  Final   Special Requests   Final    BOTTLES DRAWN AEROBIC AND ANAEROBIC Blood Culture adequate volume   Culture   Final    NO GROWTH 2 DAYS Performed at [facility], [address], [place], [place] [address]    Report Status PENDING  Incomplete  Blood Culture (routine x 2)     Status: None (Preliminary result)   Collection Time: 12/22/23 12:37 PM   Specimen: BLOOD  Result Value Ref Range Status   Specimen Description BLOOD LEFT ANTECUBITAL  Final   Special Requests   Final    BOTTLES DRAWN AEROBIC AND ANAEROBIC Blood Culture adequate volume   Culture   Final    NO GROWTH 2 DAYS Performed at [facility], [address] [place], [place] [address]    Report Status PENDING  Incomplete  MRSA Next Gen by PCR, Nasal      Status: None   Collection Time: 12/22/23  3:43 PM   Specimen: Nasal Mucosa; Nasal Swab  Result Value Ref Range Status   MRSA by PCR Next Gen NOT DETECTED NOT DETECTED Final    Comment: (NOTE) The GeneXpert MRSA Assay (FDA approved for NASAL specimens only), is one component of a comprehensive MRSA colonization surveillance program. It is not intended to diagnose MRSA infection nor to guide or monitor treatment for MRSA infections. Test performance is not FDA approved in patients less than 45 years old. Performed at [facility], [address], [place], [place] [address]       Radiology Studies last 3 days: [facility] Chest [facility] 1 View Result Date: 12/22/2023 CLINICAL DATA:  Shortness of breath. EXAM: PORTABLE CHEST 1 VIEW COMPARISON:  Chest radiograph dated 12/07/2023. FINDINGS: Bilateral pulmonary opacities involving the right upper lung field and left lung. There is interval progression of right upper lung field opacity since the prior radiograph and CT. Small left pleural effusion. No pneumothorax. Top-normal cardiac silhouette. Atherosclerotic calcification of the aorta. No acute osseous pathology. Represent IMPRESSION: 1. Interval progression of right  upper lung field opacity. 2. Small left pleural effusion. Electronically Signed   By: Vanetta Chou M.D.   On: 12/22/2023 14:51       Time spent: 50 min     Clair Alfieri, DO [facility] 12/24/2023, 3:04 PM    Dictation software may have been used to generate the above note. Typos may occur and escape review in typed/dictated notes. Please contact Dr Marsa directly for clarity if needed.  Staff may message me via secure chat in Epic  but this may not receive an immediate response,  please page me for urgent matters!  If 7PM-7AM, please contact night coverage [email or web address]

## 2023-12-24 NOTE — ED Notes (Signed)
Pt assisted with using bedpan.

## 2023-12-24 NOTE — Evaluation (Signed)
 Physical Therapy Evaluation Patient Details Name: [name] MRN: [id number] DOB: September 15, 1929 Today's Date: 12/24/2023  History of Present Illness  Pt is a 89 y.o. female presenting to hospital 12/22/23 for evaluation of GI bleeding.  Recent hospitalization with PE, DVT B LE's, and GI bleeding; s/p mechanical thrombectomy and IVC filter placement 12/08/23.  Pt admitted with upper GI bleed, hypotension, acute blood loss anemia, CAP, iron  deficiency anemia. PMH includes colon CA (s/p partial colectomy), htn, HLD, DM, diastolic CHF, stroke, B carotid artery stenosis, PSVT, IBS, L CEA, h/o GI bleed.  Clinical Impression  Prior to recent medical concerns, most recently pt reports using RW within home but has required a lot of assist from family (pt reports going slow and not able to do a lot recently); lives alone in 1 level home (level entry); has family support.  No c/o pain during session.  Currently pt is SBA to min assist with bed mobility; CGA to min assist with transfers using walker; and min assist to ambulate 12 feet with youth sized RW use.  Limited distance ambulating d/t pt fatigue/generalized weakness; pt also noted to be shaky during ambulation.  HR 93-115 bpm during sessions activities and SpO2 sats 89% or greater on room air during session (end of session HR 97 bpm with SpO2 sats 92% on room air).  Pt would currently benefit from skilled PT to address noted impairments and functional limitations (see below for any additional details).  Upon hospital discharge, pt would benefit from ongoing therapy.    If plan is discharge home, recommend the following: A little help with walking and/or transfers;A little help with bathing/dressing/bathroom;Assistance with cooking/housework;Assist for transportation;Help with stairs or ramp for entrance   Can travel by private vehicle        Equipment Recommendations  (Pt has RW at home already)  Recommendations for Other Services       Functional  Status Assessment Patient has had a recent decline in their functional status and demonstrates the ability to make significant improvements in function in a reasonable and predictable amount of time.     Precautions / Restrictions Precautions Precautions: Fall Restrictions Weight Bearing Restrictions Per Provider Order: No      Mobility  Bed Mobility Overal bed mobility: Needs Assistance Bed Mobility: Supine to Sit, Sit to Supine     Supine to sit: Supervision, HOB elevated Sit to supine: Min assist, HOB elevated (assist for B LE's)   General bed mobility comments: increased effort to perform    Transfers Overall transfer level: Needs assistance Equipment used: Rolling walker (2 wheels) (youth sized) Transfers: Sit to/from Stand Sit to Stand: Contact guard assist, Min assist           General transfer comment: CGA to min assist to steady standing from bed x2 trials; vc's for UE placement    Ambulation/Gait Ambulation/Gait assistance: Min assist Gait Distance (Feet): 12 Feet Assistive device: Rolling walker (2 wheels) (youth sized) Gait Pattern/deviations: Step-through pattern, Decreased step length - right, Decreased step length - left Gait velocity: decreased     General Gait Details: pt appearing shaky and unsteady ambulating requiring assist to steady; limited d/t pt fatigue and generalized weakness  Stairs            Wheelchair Mobility     Tilt Bed    Modified Rankin (Stroke Patients Only)       Balance Overall balance assessment: Needs assistance Sitting-balance support: No upper extremity supported, Feet supported Sitting balance-Leahy Scale:  Good Sitting balance - Comments: steady reaching within BOS   Standing balance support: During functional activity, Reliant on assistive device for balance, Bilateral upper extremity supported Standing balance-Leahy Scale: Poor Standing balance comment: assist to steady walking with walker                              Pertinent Vitals/Pain Pain Assessment Pain Assessment: No/denies pain    Home Living Family/patient expects to be discharged to:: Private residence Living Arrangements: Alone Available Help at Discharge: Family;Available PRN/intermittently (Daughter lives on one side and daughter in law lives on other side of pt) Type of Home: House Home Access: Level entry       Home Layout: One level Home Equipment: Agricultural Consultant (2 wheels);Cane - single point;Grab bars - tub/shower      Prior Function Prior Level of Function : Needs assist             Mobility Comments: Pt reports most recently using RW for ambulation within home d/t weakness (prior to that would use SPC or walker); no recent falls reported since last hospitalization       Extremity/Trunk Assessment   Upper Extremity Assessment Upper Extremity Assessment: Generalized weakness    Lower Extremity Assessment Lower Extremity Assessment: Generalized weakness    Cervical / Trunk Assessment Cervical / Trunk Assessment: Normal  Communication   Communication Communication: No apparent difficulties Cueing Techniques: Verbal cues  Cognition Arousal: Alert Behavior During Therapy: WFL for tasks assessed/performed Overall Cognitive Status: Within Functional Limits for tasks assessed                                          General Comments General comments (skin integrity, edema, etc.): BP 128/70 with HR 93 bpm and SpO2 sats 93% on room air at rest beginning of session; HR increased to 115 bpm with activity with SpO2 sats 89% on room air; end of session at rest in bed pt's BP 143/67 with HR 97 bpm and SpO2 sats 92% on room air.    Exercises     Assessment/Plan    PT Assessment Patient needs continued PT services  PT Problem List Decreased strength;Decreased activity tolerance;Decreased balance;Decreased mobility       PT Treatment Interventions DME instruction;Gait  training;Functional mobility training;Therapeutic activities;Therapeutic exercise;Balance training;Patient/family education    PT Goals (Current goals can be found in the Care Plan section)  Acute Rehab PT Goals Patient Stated Goal: to improve strength and walking PT Goal Formulation: With patient Time For Goal Achievement: 01/07/24 Potential to Achieve Goals: Good    Frequency Min 1X/week     Co-evaluation               AM-PAC PT 6 Clicks Mobility  Outcome Measure Help needed turning from your back to your side while in a flat bed without using bedrails?: None Help needed moving from lying on your back to sitting on the side of a flat bed without using bedrails?: A Little Help needed moving to and from a bed to a chair (including a wheelchair)?: A Little Help needed standing up from a chair using your arms (e.g., wheelchair or bedside chair)?: A Little Help needed to walk in hospital room?: A Little Help needed climbing 3-5 steps with a railing? : A Lot 6 Click Score: 18    End  of Session Equipment Utilized During Treatment: Gait belt Activity Tolerance: Patient limited by fatigue Patient left: in bed;with call bell/phone within reach;with bed alarm set Nurse Communication: Mobility status;Precautions;Other (comment) (Pt's HR during session) PT Visit Diagnosis: Unsteadiness on feet (R26.81);Other abnormalities of gait and mobility (R26.89);Muscle weakness (generalized) (M62.81);History of falling (Z91.81)    Time: 9143-9085 PT Time Calculation (min) (ACUTE ONLY): 18 min   Charges:   PT Evaluation $PT Eval Low Complexity: 1 Low PT Treatments $Therapeutic Activity: 8-22 mins PT General Charges $$ ACUTE PT VISIT: 1 Visit        Damien Caulk, PT 12/24/23, 9:54 AM

## 2023-12-24 NOTE — Telephone Encounter (Signed)
 [facility] faxed in  certification paperwork on 12/21/23 to be completed. Front desk portion completed and provider signed on 12/23/23 being faxed back to agency 12/24/23 VM

## 2023-12-24 NOTE — ED Notes (Signed)
 Pt found up out of bed by staff. Bed alarm was going off. Pt's call bell remained tied to bed rail. Pt reported not being able to find it. Reoriented patient to room and call bell. Placed call bell in patients hand after helping her back to bed. Pt denies any further needs at this time

## 2023-12-24 NOTE — ED Notes (Signed)
 Pt resting quietly with eyes closed. Respirations even and non labored. Cardiac monitoring remains in place. CB remains within reach. No needs voiced at this time.

## 2023-12-24 NOTE — Progress Notes (Signed)
 Whitney Copping, MD [facility]   [address], [address] [place], [place] [address] Phone: [phone] Fax : [phone]   Subjective: The patient had a very small amount of blood in the bowel movement yesterday that appeared to be old blood.  The patient states that she does not feel her normal self today.  The patient's hemoglobin has been stable.   Objective: Vital signs in last 24 hours: Vitals:   12/24/23 0600 12/24/23 0641 12/24/23 0700 12/24/23 0849  BP: (!) 111/57  (!) 100/55 128/70  Pulse: 80  68 91  Resp: (!) 21  18 17   Temp:  98.1 F (36.7 C)  99 F (37.2 C)  TempSrc:  Oral  Oral  SpO2: 91%  91% 93%  Weight:      Height:       Weight change:  No intake or output data in the 24 hours ending 12/24/23 1145   Exam: Heart:: Regular rate and rhythm, S1S2 present, or without murmur or extra heart sounds Lungs: normal and clear to auscultation and percussion Abdomen: soft, nontender, normal bowel sounds   Lab Results: @LABTEST2 @ Micro Results: Recent Results (from the past 240 hours)  Blood Culture (routine x 2)     Status: None (Preliminary result)   Collection Time: 12/22/23 12:37 PM   Specimen: BLOOD  Result Value Ref Range Status   Specimen Description BLOOD BLOOD RIGHT FOREARM  Final   Special Requests   Final    BOTTLES DRAWN AEROBIC AND ANAEROBIC Blood Culture adequate volume   Culture   Final    NO GROWTH 2 DAYS Performed at [facility], [address] [place], [place] [address]    Report Status PENDING  Incomplete  Blood Culture (routine x 2)     Status: None (Preliminary result)   Collection Time: 12/22/23 12:37 PM   Specimen: BLOOD  Result Value Ref Range Status   Specimen Description BLOOD LEFT ANTECUBITAL  Final   Special Requests   Final    BOTTLES DRAWN AEROBIC AND ANAEROBIC Blood Culture adequate volume   Culture   Final    NO GROWTH 2 DAYS Performed at [facility], [address] [place], [place] [address]     Report Status PENDING  Incomplete  MRSA Next Gen by PCR, Nasal     Status: None   Collection Time: 12/22/23  3:43 PM   Specimen: Nasal Mucosa; Nasal Swab  Result Value Ref Range Status   MRSA by PCR Next Gen NOT DETECTED NOT DETECTED Final    Comment: (NOTE) The GeneXpert MRSA Assay (FDA approved for NASAL specimens only), is one component of a comprehensive MRSA colonization surveillance program. It is not intended to diagnose MRSA infection nor to guide or monitor treatment for MRSA infections. Test performance is not FDA approved in patients less than 62 years old. Performed at [facility], [address] [place], [place] [address]    Studies/Results: [facility] Chest Port 1 View Result Date: 12/22/2023 CLINICAL DATA:  Shortness of breath. EXAM: PORTABLE CHEST 1 VIEW COMPARISON:  Chest radiograph dated 12/07/2023. FINDINGS: Bilateral pulmonary opacities involving the right upper lung field and left lung. There is interval progression of right upper lung field opacity since the prior radiograph and CT. Small left pleural effusion. No pneumothorax. Top-normal cardiac silhouette. Atherosclerotic calcification of the aorta. No acute osseous pathology. Represent IMPRESSION: 1. Interval progression of right upper lung field opacity. 2. Small left pleural effusion. Electronically Signed   By: Vanetta  Radparvar M.D.   On: 12/22/2023 14:51   Medications: I have reviewed the patient's current medications. Scheduled Meds:  azithromycin   500 mg Oral Daily   cefpodoxime   200 mg Oral Q12H   pantoprazole  (PROTONIX ) IV  80 mg Intravenous BID   traZODone   100 mg Oral QHS   Continuous Infusions: PRN Meds:.acetaminophen  **OR** acetaminophen , hydrALAZINE , melatonin, ondansetron  **OR** ondansetron  (ZOFRAN ) IV   Assessment: Principal Problem:   Upper GI bleed Active Problems:   History of CVA (cerebrovascular accident)   Melena   Acute blood loss anemia   Colon cancer (HCC)   Hypertension  associated with diabetes (HCC)   Hyperlipidemia   Type 2 diabetes mellitus with hyperglycemia, without long-term current use of insulin  (HCC)   Primary insomnia   Acute GI bleeding   Lung nodule   FTT (failure to thrive) in adult   Iron  deficiency anemia due to chronic blood loss   Depression, recurrent (HCC)   Essential hypertension   Pulmonary embolism (HCC)   CAP (community acquired pneumonia)   Hypotension    Plan: There has been no further sign of any GI bleeding and the patient's hemoglobin has been stable.  Due to the patient's other medical conditions no further GI intervention is recommended at this time.  The patient can have her diet advanced.  Please contact Dr. Onita know if she have any further bleeding over the weekend since he will be covering the weekend.   LOS: 2 days   Whitney Copping, MD.FACG 12/24/2023, 11:45 AM Pager [phone] 7am-5pm  Check AMION for 5pm -7am coverage and on weekends

## 2023-12-25 DIAGNOSIS — K922 Gastrointestinal hemorrhage, unspecified: Secondary | ICD-10-CM | POA: Diagnosis not present

## 2023-12-25 LAB — COMPREHENSIVE METABOLIC PANEL
ALT: 18 U/L (ref 0–44)
AST: 21 U/L (ref 15–41)
Albumin: 2.3 g/dL — ABNORMAL LOW (ref 3.5–5.0)
Alkaline Phosphatase: 96 U/L (ref 38–126)
Anion gap: 13 (ref 5–15)
BUN: 11 mg/dL (ref 8–23)
CO2: 19 mmol/L — ABNORMAL LOW (ref 22–32)
Calcium: 8.5 mg/dL — ABNORMAL LOW (ref 8.9–10.3)
Chloride: 103 mmol/L (ref 98–111)
Creatinine, Ser: 0.84 mg/dL (ref 0.44–1.00)
GFR, Estimated: >60 mL/min (ref >60)
Glucose, Bld: 204 mg/dL — ABNORMAL HIGH (ref 70–99)
Potassium: 4.2 mmol/L (ref 3.5–5.1)
Sodium: 135 mmol/L (ref 135–145)
Total Bilirubin: 0.4 mg/dL (ref 0.0–1.2)
Total Protein: 5.5 g/dL — ABNORMAL LOW (ref 6.5–8.1)

## 2023-12-25 LAB — URINE CULTURE

## 2023-12-25 LAB — PHOSPHORUS: Phosphorus: 2.7 mg/dL (ref 2.5–4.6)

## 2023-12-25 LAB — HEMOGLOBIN AND HEMATOCRIT, BLOOD
HCT: 24.3 % — ABNORMAL LOW (ref 36.0–46.0)
HCT: 26.5 % — ABNORMAL LOW (ref 36.0–46.0)
Hemoglobin: 7.7 g/dL — ABNORMAL LOW (ref 12.0–15.0)
Hemoglobin: 8.3 g/dL — ABNORMAL LOW (ref 12.0–15.0)

## 2023-12-25 LAB — MAGNESIUM: Magnesium: 1.8 mg/dL (ref 1.7–2.4)

## 2023-12-25 MED ORDER — IPRATROPIUM-ALBUTEROL 0.5-2.5 (3) MG/3ML IN SOLN
3.0000 mL | Freq: Four times a day (QID) | RESPIRATORY_TRACT | Status: AC
  Administered 2023-12-25 – 2023-12-26 (×3): 3 mL via RESPIRATORY_TRACT
  Filled 2023-12-25 (×5): qty 3

## 2023-12-25 NOTE — Progress Notes (Signed)
 PROGRESS NOTE    [name]   FMW:[id number] DOB: [birth year]  DOA: 12/22/2023 Date of Service: 12/25/23 which is hospital day 3  PCP: Wellington Curtis LABOR, [facility] course / significant events:   HPI: [name] is a 89 female with history of colon cancer, pulmonary nodule presumed malignant, history of PE on Eliquis , iron  deficiency anemia, GIB 09/2023 diverticular, chronic blood loss anemia, who presents to the emergency department for chief concerns of black stool x1 week, last Eliquis  taken AM 02/05. Was seen by Dr. Jacobo earlier 02/05 and sent to ED.   Of note, recent pulm embolism in January 2025, initially dx on CT outpatient done by oncology 01/07, at that time hesitant to anticoagulate given GIB Hx and referred to vascular surgery, initial plan was for IVC filter placement 01/28. However prior to that she was admitted at [facility] 01/21 w/ worsening thrombus burden compared to prior CT 01/07, therefore underwent thrombectomy and IVC placement inpatient on 01/22, was discharged from the hospital on 01/26. Has been on Eliquis  since then.    Relevant Hx GI - 10/05/2023 colonoscopy by Dr. Aundria showed end-to-side ileocolonic anastomosis in the ascending colon diverticulosis of the colon. At that time she was admitted with melena and it was felt that the bleeding might have been diverticular  02/05: in ED, Hgb 8.6 on admission (approx 11:30), dropping to 6.7 approx 19:00, transfusion ordered. Also tx for CAP. GI recommendations: high risk for anesthesia, monitor CBC / transfuse as needed, hold off on scope until at least 48h out from last Eliquis  dose, consider CTA w/ IR in interim if worsening bleeding.  02/06: Hgb to 8.4 at 05:00 --> 8.3 at 12:00 - stable 02/07: patient feeling bad this morning after working w/ PT, nauseous, otherwise cannot elaborate on symptoms and neg ROS. Not eating much. UA concerning for UTI, she is already on 3rd gen cephalosporin for pneumonia will  continue this pend cultures which may be neg anyway since on abx .  02/08: advancing diet, stool less black, Hgb down some but steady, continue monitor q12h     Consultants:  Gastroenterology   Procedures/Surgeries: none      ASSESSMENT & PLAN:   Upper GI bleed ABLA d/t GIB S/p 2 units PRBC transfusion on 02/05 High risk for anesthesia Protonix  80 mg IV bid Hold home Eliquis   Gastroenterology following peripherally over the weekend  monitor CBC / transfuse as needed hold off on scope unless worse but she has been stable  consider CTA w/ IR if worsening bleeding Ok to advance diet    Hypotension d/t hypovolemia d/t GIB - resolved Responded to aggressive fluid and PRBC Hold antihypertensives GIB tx as above    UA concerning for UTI / asymptomatic bacteriuria  she is already on 3rd gen cephalosporin for pneumonia will continue this pend cultures which may be neg anyway since on abx   Pulmonary embolism diagnosed in January 2025 Status post thrombectomy and IVC filter  Hold Eliquis  d/t GI bleed  Hypokalemia Replace as needed Monitor BMP  Primary insomnia Melatonin 5 mg nightly as needed for sleep   Hypertension associated with diabetes (HCC) Hydralazine  5 mg every 6 hours as needed for SBP > 175, 5 days ordered   Type 2 diabetes mellitus with hyperglycemia, without long-term current use of insulin  (HCC) Insulin  held given low po intake    Query CAP (community acquired pneumonia) Procalcitonin low Deescalate abx to po, given equivocal imaging and (+)significant coughing  will treat for CAP   Iron  deficiency anemia due to chronic blood loss Baseline hemoglobin is 9.3-13.2 in the last 2 weeks See above re ABLA    No active concerns based on BMI: Body mass index is 20.85 kg/m.  However given cancer hx and general debility, there is likely component of malnutrition  Underweight - under 18  overweight - 25 to 29 obese - 30 or more Class 1 obesity: BMI of  30.0 to 34 Class 2 obesity: BMI of 35.0 to 39 Class 3 obesity: BMI of 40.0 to 49 Super Morbid Obesity: BMI 50-59 Super-super Morbid Obesity: BMI 60+ Significantly low or high BMI is associated with higher medical risk.  Weight management advised as adjunct to other disease management and risk reduction treatments    DVT prophylaxis: SCD IV fluids: holding continuous IV fluids  Nutrition: CLD - anticipate may need emergent EGD/scope Central lines / invasive devices: none  Code Status: DNR ACP documentation reviewed:  none on file in [facility]  TOC needs: TBD, pend PT/OT eval  Barriers to dispo / significant pending items: active tx the above, estimated medical readiness pending HH and po toelrance, SNF placement will keep her here until at least Mon             Subjective / Brief ROS:  Dark stool greensish but not so black now  Reports feeling very tired - about same yesterday but she is getting OOB some w/ family  Denies CP/SOB.  Pain controlled.  Tolerating clears.  Reports no concerns w/ urination/defecation.   Family Communication: family at bedisde on rounds     Objective Findings:  Vitals:   12/24/23 2357 12/25/23 0421 12/25/23 0846 12/25/23 1255  BP: (!) 119/49 (!) 109/53 (!) 110/59 (!) 110/57  Pulse: 88 86 89 84  Resp: 16 16 18 16   Temp: 98.5 F (36.9 C) 98.5 F (36.9 C) 98.6 F (37 C) 98.2 F (36.8 C)  TempSrc: Oral Oral Oral Oral  SpO2: 96% 93% 92% 100%  Weight:      Height:        Intake/Output Summary (Last 24 hours) at 12/25/2023 1323 Last data filed at 12/24/2023 1900 Gross per 24 hour  Intake 240 ml  Output --  Net 240 ml    Filed Weights   12/22/23 1221  Weight: 51.7 kg    Examination:  Physical Exam Constitutional:      General: She is not in acute distress.    Appearance: She is ill-appearing. She is not diaphoretic.  Cardiovascular:     Rate and Rhythm: Normal rate and regular rhythm.     Heart sounds: Normal heart sounds.   Pulmonary:     Effort: Pulmonary effort is normal. No respiratory distress.     Breath sounds: Normal breath sounds.  Abdominal:     General: Abdomen is flat. Bowel sounds are normal.     Palpations: Abdomen is soft.  Musculoskeletal:     Right lower leg: No edema.  Skin:    General: Skin is warm and dry.  Neurological:     General: No focal deficit present.     Mental Status: She is alert and oriented to person, place, and time. Mental status is at baseline.  Psychiatric:        Mood and Affect: Mood normal.        Behavior: Behavior normal.          Scheduled Medications:   azithromycin   500 mg Oral Daily  cefpodoxime   200 mg Oral Q12H   pantoprazole  (PROTONIX ) IV  80 mg Intravenous BID   traZODone   100 mg Oral QHS    Continuous Infusions:    PRN Medications:  acetaminophen  **OR** acetaminophen , melatonin, ondansetron  **OR** ondansetron  (ZOFRAN ) IV  Antimicrobials from admission:  Anti-infectives (From admission, onward)    Start     Dose/Rate Route Frequency Ordered Stop   12/24/23 1000  azithromycin  (ZITHROMAX ) tablet 500 mg        500 mg Oral Daily 12/23/23 0736 12/27/23 0959   12/23/23 2000  cefpodoxime  (VANTIN ) tablet 200 mg        200 mg Oral Every 12 hours 12/23/23 1446     12/23/23 1000  azithromycin  (ZITHROMAX ) 500 mg in sodium chloride  0.9 % 250 mL IVPB        500 mg 250 mL/hr over 60 Minutes Intravenous Every 24 hours 12/22/23 1543 12/23/23 1041   12/23/23 0400  ceFEPIme  (MAXIPIME ) 2 g in sodium chloride  0.9 % 100 mL IVPB  Status:  Discontinued        2 g 200 mL/hr over 30 Minutes Intravenous Every 12 hours 12/22/23 1559 12/23/23 1446   12/22/23 1515  vancomycin  (VANCOCIN ) IVPB 1000 mg/200 mL premix        1,000 mg 200 mL/hr over 60 Minutes Intravenous  Once 12/22/23 1514 12/22/23 1852   12/22/23 1515  ceFEPIme  (MAXIPIME ) 2 g in sodium chloride  0.9 % 100 mL IVPB        2 g 200 mL/hr over 30 Minutes Intravenous  Once 12/22/23 1514 12/22/23 1609    12/22/23 1515  azithromycin  (ZITHROMAX ) 500 mg in sodium chloride  0.9 % 250 mL IVPB        500 mg 250 mL/hr over 60 Minutes Intravenous  Once 12/22/23 1514 12/22/23 1736           Data Reviewed:  I have personally reviewed the following...  CBC: Recent Labs  Lab 12/22/23 1128 12/22/23 1850 12/23/23 0507 12/23/23 1212 12/23/23 1910 12/24/23 0316 12/24/23 1017 12/24/23 2003 12/25/23 0755  WBC 10.5  --  6.4  --   --   --  5.8  --   --   NEUTROABS 8.2*  --   --   --   --   --  4.0  --   --   HGB 8.6*   < > 8.4*   < > 8.3* 8.4* 8.3* 7.9* 7.7*  HCT 28.2*   < > 26.7*   < > 26.1* 26.0* 26.7* 24.5* 24.3*  MCV 86.8  --  87.8  --   --   --  88.1  --   --   PLT 383  --  250  --   --   --  290  --   --    < > = values in this interval not displayed.   Basic Metabolic Panel: Recent Labs  Lab 12/22/23 1237 12/23/23 0507 12/24/23 1017  NA 136 137 138  K 4.8 4.0 3.3*  CL 102 109 105  CO2 24 22 21*  GLUCOSE 202* 110* 201*  BUN 18 12 8   CREATININE 0.80 0.59 0.70  CALCIUM  9.6 8.1* 8.5*   GFR: Estimated Creatinine Clearance: 35.5 mL/min (by C-G formula based on SCr of 0.7 mg/dL). Liver Function Tests: Recent Labs  Lab 12/22/23 1237  AST 33  ALT 31  ALKPHOS 113  BILITOT 0.2  PROT 7.2  ALBUMIN 2.7*   No results for input(s): LIPASE, AMYLASE in  the last 168 hours. No results for input(s): AMMONIA in the last 168 hours. Coagulation Profile: No results for input(s): INR, PROTIME in the last 168 hours. Cardiac Enzymes: No results for input(s): CKTOTAL, CKMB, CKMBINDEX, TROPONINI in the last 168 hours. BNP (last 3 results) No results for input(s): PROBNP in the last 8760 hours. HbA1C: No results for input(s): HGBA1C in the last 72 hours. CBG: Recent Labs  Lab 12/23/23 0852 12/23/23 1210 12/23/23 1627 12/23/23 2132 12/24/23 0847  GLUCAP 158* 133* 136* 141* 136*   Lipid Profile: No results for input(s): CHOL, HDL, LDLCALC, TRIG,  CHOLHDL, LDLDIRECT in the last 72 hours. Thyroid  Function Tests: No results for input(s): TSH, T4TOTAL, FREET4, T3FREE, THYROIDAB in the last 72 hours. Anemia Panel: No results for input(s): VITAMINB12, FOLATE, FERRITIN, TIBC, IRON , RETICCTPCT in the last 72 hours. Most Recent Urinalysis On File:     Component Value Date/Time   COLORURINE YELLOW (A) 12/24/2023 0956   APPEARANCEUR HAZY (A) 12/24/2023 0956   APPEARANCEUR Clear 06/13/2021 1019   LABSPEC 1.017 12/24/2023 0956   LABSPEC 1.010 12/15/2014 1926   PHURINE 5.0 12/24/2023 0956   GLUCOSEU >=500 (A) 12/24/2023 0956   GLUCOSEU NEGATIVE 12/15/2014 1926   HGBUR MODERATE (A) 12/24/2023 0956   BILIRUBINUR NEGATIVE 12/24/2023 0956   BILIRUBINUR Negative 06/13/2021 1019   BILIRUBINUR NEGATIVE 12/15/2014 1926   KETONESUR NEGATIVE 12/24/2023 0956   PROTEINUR NEGATIVE 12/24/2023 0956   NITRITE NEGATIVE 12/24/2023 0956   LEUKOCYTESUR SMALL (A) 12/24/2023 0956   LEUKOCYTESUR NEGATIVE 12/15/2014 1926   Sepsis Labs: @LABRCNTIP (procalcitonin:4,lacticidven:4) Microbiology: Recent Results (from the past 240 hours)  Blood Culture (routine x 2)     Status: None (Preliminary result)   Collection Time: 12/22/23 12:37 PM   Specimen: BLOOD  Result Value Ref Range Status   Specimen Description BLOOD BLOOD RIGHT FOREARM  Final   Special Requests   Final    BOTTLES DRAWN AEROBIC AND ANAEROBIC Blood Culture adequate volume   Culture   Final    NO GROWTH 3 DAYS Performed at [facility], [address] [place], [place] [address]    Report Status PENDING  Incomplete  Blood Culture (routine x 2)     Status: None (Preliminary result)   Collection Time: 12/22/23 12:37 PM   Specimen: BLOOD  Result Value Ref Range Status   Specimen Description BLOOD LEFT ANTECUBITAL  Final   Special Requests   Final    BOTTLES DRAWN AEROBIC AND ANAEROBIC Blood Culture adequate volume   Culture   Final    NO GROWTH 3  DAYS Performed at [facility], [address] [place], [place] [address]    Report Status PENDING  Incomplete  MRSA Next Gen by PCR, Nasal     Status: None   Collection Time: 12/22/23  3:43 PM   Specimen: Nasal Mucosa; Nasal Swab  Result Value Ref Range Status   MRSA by PCR Next Gen NOT DETECTED NOT DETECTED Final    Comment: (NOTE) The GeneXpert MRSA Assay (FDA approved for NASAL specimens only), is one component of a comprehensive MRSA colonization surveillance program. It is not intended to diagnose MRSA infection nor to guide or monitor treatment for MRSA infections. Test performance is not FDA approved in patients less than 27 years old. Performed at [facility], [address] [place], [place] [address]   Urine Culture     Status: None (Preliminary result)   Collection Time: 12/24/23  9:56 AM   Specimen: Urine, Clean Catch  Result Value Ref Range Status   Specimen Description   Final    URINE, CLEAN CATCH Performed at [facility], [address] [place], [place] [address]    Special Requests   Final    NONE Reflexed from [phone] Performed at [facility], [address] [place], [place] [address]    Culture PENDING  Incomplete   Report Status PENDING  Incomplete      Radiology Studies last 3 days: [facility] Chest Port 1 View Result Date: 12/22/2023 CLINICAL DATA:  Shortness of breath. EXAM: PORTABLE CHEST 1 VIEW COMPARISON:  Chest radiograph dated 12/07/2023. FINDINGS: Bilateral pulmonary opacities involving the right upper lung field and left lung. There is interval progression of right upper lung field opacity since the prior radiograph and CT. Small left pleural effusion. No pneumothorax. Top-normal cardiac silhouette. Atherosclerotic calcification of the aorta. No acute osseous pathology. Represent IMPRESSION: 1. Interval progression of right upper lung field opacity. 2. Small left pleural effusion. Electronically Signed   By: Vanetta Chou M.D.   On: 12/22/2023 14:51       Time spent: 50 min     Omkar Stratmann, DO [facility] 12/25/2023, 1:23 PM    Dictation software may have been used to generate the above note. Typos may occur and escape review in typed/dictated notes. Please contact Dr Marsa directly for clarity if needed.  Staff may message me via secure chat in Epic  but this may not receive an immediate response,  please page me for urgent matters!  If 7PM-7AM, please contact night coverage [email or web address]

## 2023-12-25 NOTE — Plan of Care (Signed)

## 2023-12-26 DIAGNOSIS — K922 Gastrointestinal hemorrhage, unspecified: Secondary | ICD-10-CM | POA: Diagnosis not present

## 2023-12-26 LAB — TYPE AND SCREEN
ABO/RH(D): A NEG
Antibody Screen: NEGATIVE
Unit division: 0
Unit division: 0

## 2023-12-26 LAB — BPAM RBC
Blood Product Expiration Date: 202502242359
Blood Product Expiration Date: 202502242359
ISSUE DATE / TIME: 202502051932
Unit Type and Rh: 600
Unit Type and Rh: 600

## 2023-12-26 LAB — HEMOGLOBIN AND HEMATOCRIT, BLOOD
HCT: 27.2 % — ABNORMAL LOW (ref 36.0–46.0)
Hemoglobin: 8.4 g/dL — ABNORMAL LOW (ref 12.0–15.0)

## 2023-12-26 NOTE — Plan of Care (Signed)
  Problem: Coping: Goal: Ability to adjust to condition or change in health will improve Outcome: Progressing   Problem: Fluid Volume: Goal: Ability to maintain a balanced intake and output will improve Outcome: Progressing   Problem: Tissue Perfusion: Goal: Adequacy of tissue perfusion will improve Outcome: Progressing

## 2023-12-26 NOTE — Progress Notes (Signed)
 PHARMACY CONSULT NOTE  Pharmacy Consult for Electrolyte Monitoring and Replacement   Recent Labs: Potassium (mmol/L)  Date Value  12/25/2023 4.2  12/17/2014 3.3 (L)   Magnesium (mg/dL)  Date Value  97/91/7974 1.8  12/17/2014 2.2   Calcium  (mg/dL)  Date Value  97/91/7974 8.5 (L)   Calcium , Total (mg/dL)  Date Value  98/68/7983 9.1   Albumin (g/dL)  Date Value  97/91/7974 2.3 (L)  10/13/2023 3.4 (L)  12/15/2014 4.0   Phosphorus (mg/dL)  Date Value  97/91/7974 2.7   Sodium (mmol/L)  Date Value  12/25/2023 135  10/13/2023 137  12/16/2014 142    Assessment: Pt is 89 yo female with upper GI bleed.  Pharmacy consulted to assist with electrolyte monitoring and replacement as indicated.  Goal of Therapy:  Electrolytes within normal limits  Plan:  --Corrected Ca: 9.5 --No electrolyte replacement needed at this time. --Pharmacy will continue to follow  Rankin CANDIE Dills, PharmD, [facility] 12/26/2023 2:53 AM

## 2023-12-26 NOTE — Progress Notes (Signed)
 PROGRESS NOTE    [name]   FMW:[id number] DOB: 05/06/1929  DOA: 12/22/2023 Date of Service: 12/26/23 which is hospital day 4  PCP: Wellington Curtis LABOR, [facility] course / significant events:   HPI: [name] is a 89 female with history of colon cancer, pulmonary nodule presumed malignant, history of PE on Eliquis , iron  deficiency anemia, GIB 09/2023 diverticular, chronic blood loss anemia, who presents to the emergency department for chief concerns of black stool x1 week, last Eliquis  taken AM 02/05. Was seen by Dr. Jacobo earlier 02/05 and sent to ED.   Of note, recent pulm embolism in January 2025, initially dx on CT outpatient done by oncology 01/07, at that time hesitant to anticoagulate given GIB Hx and referred to vascular surgery, initial plan was for IVC filter placement 01/28. However prior to that she was admitted at [facility] 01/21 w/ worsening thrombus burden compared to prior CT 01/07, therefore underwent thrombectomy and IVC placement inpatient on 01/22, was discharged from the hospital on 01/26. Has been on Eliquis  since then.    Relevant Hx GI - 10/05/2023 colonoscopy by Dr. Aundria showed end-to-side ileocolonic anastomosis in the ascending colon diverticulosis of the colon. At that time she was admitted with melena and it was felt that the bleeding might have been diverticular  02/05: in ED, Hgb 8.6 on admission (approx 11:30), dropping to 6.7 approx 19:00, transfusion ordered. Also tx for CAP. GI recommendations: high risk for anesthesia, monitor CBC / transfuse as needed, hold off on scope until at least 48h out from last Eliquis  dose, consider CTA w/ IR in interim if worsening bleeding.  02/06: Hgb to 8.4 at 05:00 --> 8.3 at 12:00 - stable 02/07: patient feeling bad this morning after working w/ PT, nauseous, otherwise cannot elaborate on symptoms and neg ROS. Not eating much. UA concerning for UTI, she is already on 3rd gen cephalosporin for pneumonia will  continue this pend cultures which may be neg anyway since on abx .  02/08: advancing diet, stool less black, Hgb down some but steady, continue monitor q12h 02/09: eating better, BM back to normal brown color, energy a bit better today, Hgb improved.      Consultants:  Gastroenterology   Procedures/Surgeries: none      ASSESSMENT & PLAN:   Upper GI bleed ABLA d/t GIB S/p 2 units PRBC transfusion on 02/05 High risk for anesthesia Protonix  80 mg IV bid Hold home Eliquis   Gastroenterology following peripherally over the weekend  monitor CBC / transfuse as needed hold off on scope unless worse but she has been stable  consider CTA w/ IR if worsening bleeding Ok to advance diet    Hypotension d/t hypovolemia d/t GIB - resolved Responded to aggressive fluid and PRBC Hold antihypertensives GIB tx as above  Encourage po fluids    UA concerning for UTI / asymptomatic bacteriuria  she is already on 3rd gen cephalosporin for pneumonia will continue this pend cultures which may be neg anyway since on abx   Pulmonary embolism diagnosed in January 2025 Status post thrombectomy and IVC filter  Hold Eliquis  d/t GI bleed  Hypokalemia Replace as needed Monitor BMP  Primary insomnia Melatonin 5 mg nightly as needed for sleep   Hypertension associated with diabetes (HCC) Hydralazine  5 mg every 6 hours as needed for SBP > 175, 5 days ordered   Type 2 diabetes mellitus with hyperglycemia, without long-term current use of insulin  (HCC) Insulin  held given low po  intake    Query CAP (community acquired pneumonia) Procalcitonin low Deescalate abx to po, given equivocal imaging and (+)significant coughing will treat for CAP   Iron  deficiency anemia due to chronic blood loss Baseline hemoglobin is 9.3-13.2 in the last 2 weeks See above re ABLA    No active concerns based on BMI: Body mass index is 20.85 kg/m.  However given cancer hx and general debility, there is likely  component of malnutrition  Underweight - under 18  overweight - 25 to 29 obese - 30 or more Class 1 obesity: BMI of 30.0 to 34 Class 2 obesity: BMI of 35.0 to 39 Class 3 obesity: BMI of 40.0 to 49 Super Morbid Obesity: BMI 50-59 Super-super Morbid Obesity: BMI 60+ Significantly low or high BMI is associated with higher medical risk.  Weight management advised as adjunct to other disease management and risk reduction treatments    DVT prophylaxis: SCD IV fluids: holding continuous IV fluids  Nutrition: CLD - anticipate may need emergent EGD/scope Central lines / invasive devices: none  Code Status: DNR ACP documentation reviewed:  none on file in [facility]  [facility] needs: SNF rehab  Barriers to dispo / significant pending items: SNF placement will keep her here until at least Mon             Subjective / Brief ROS:  Stool now brown Reprots some better energy Tolerating solids  Reports no concerns w/ urination/defecation.   Family Communication: family at bedisde on rounds     Objective Findings:  Vitals:   12/25/23 2330 12/26/23 0346 12/26/23 0757 12/26/23 1245  BP: (!) 105/54 (!) 93/56 (!) 104/55 (!) 141/71  Pulse: 91 75 (!) 108 81  Resp: 18 16    Temp: 98 F (36.7 C) 98 F (36.7 C) 98.4 F (36.9 C) 98 F (36.7 C)  TempSrc: Oral  Oral   SpO2: 96% 92% 94% 96%  Weight:      Height:        Intake/Output Summary (Last 24 hours) at 12/26/2023 1246 Last data filed at 12/25/2023 1846 Gross per 24 hour  Intake 240 ml  Output --  Net 240 ml    Filed Weights   12/22/23 1221  Weight: 51.7 kg    Examination:  Physical Exam Constitutional:      General: She is not in acute distress.    Appearance: She is not ill-appearing or diaphoretic.  Cardiovascular:     Rate and Rhythm: Normal rate and regular rhythm.     Heart sounds: Normal heart sounds.  Pulmonary:     Effort: Pulmonary effort is normal. No respiratory distress.     Breath sounds: Normal breath  sounds.  Abdominal:     General: Abdomen is flat. Bowel sounds are normal.     Palpations: Abdomen is soft.  Musculoskeletal:     Right lower leg: No edema.  Skin:    General: Skin is warm and dry.  Neurological:     General: No focal deficit present.     Mental Status: She is alert and oriented to person, place, and time. Mental status is at baseline.  Psychiatric:        Mood and Affect: Mood normal.        Behavior: Behavior normal.          Scheduled Medications:   cefpodoxime   200 mg Oral Q12H   ipratropium-albuterol   3 mL Nebulization Q6H   pantoprazole  (PROTONIX ) IV  80 mg Intravenous BID  traZODone   100 mg Oral QHS    Continuous Infusions:    PRN Medications:  acetaminophen  **OR** acetaminophen , melatonin, ondansetron  **OR** ondansetron  (ZOFRAN ) IV  Antimicrobials from admission:  Anti-infectives (From admission, onward)    Start     Dose/Rate Route Frequency Ordered Stop   12/24/23 1000  azithromycin  (ZITHROMAX ) tablet 500 mg        500 mg Oral Daily 12/23/23 0736 12/26/23 1019   12/23/23 2000  cefpodoxime  (VANTIN ) tablet 200 mg        200 mg Oral Every 12 hours 12/23/23 1446     12/23/23 1000  azithromycin  (ZITHROMAX ) 500 mg in sodium chloride  0.9 % 250 mL IVPB        500 mg 250 mL/hr over 60 Minutes Intravenous Every 24 hours 12/22/23 1543 12/23/23 1041   12/23/23 0400  ceFEPIme  (MAXIPIME ) 2 g in sodium chloride  0.9 % 100 mL IVPB  Status:  Discontinued        2 g 200 mL/hr over 30 Minutes Intravenous Every 12 hours 12/22/23 1559 12/23/23 1446   12/22/23 1515  vancomycin  (VANCOCIN ) IVPB 1000 mg/200 mL premix        1,000 mg 200 mL/hr over 60 Minutes Intravenous  Once 12/22/23 1514 12/22/23 1852   12/22/23 1515  ceFEPIme  (MAXIPIME ) 2 g in sodium chloride  0.9 % 100 mL IVPB        2 g 200 mL/hr over 30 Minutes Intravenous  Once 12/22/23 1514 12/22/23 1609   12/22/23 1515  azithromycin  (ZITHROMAX ) 500 mg in sodium chloride  0.9 % 250 mL IVPB        500  mg 250 mL/hr over 60 Minutes Intravenous  Once 12/22/23 1514 12/22/23 1736           Data Reviewed:  I have personally reviewed the following...  CBC: Recent Labs  Lab 12/22/23 1128 12/22/23 1850 12/23/23 0507 12/23/23 1212 12/24/23 1017 12/24/23 2003 12/25/23 0755 12/25/23 1929 12/26/23 0828  WBC 10.5  --  6.4  --  5.8  --   --   --   --   NEUTROABS 8.2*  --   --   --  4.0  --   --   --   --   HGB 8.6*   < > 8.4*   < > 8.3* 7.9* 7.7* 8.3* 8.4*  HCT 28.2*   < > 26.7*   < > 26.7* 24.5* 24.3* 26.5* 27.2*  MCV 86.8  --  87.8  --  88.1  --   --   --   --   PLT 383  --  250  --  290  --   --   --   --    < > = values in this interval not displayed.   Basic Metabolic Panel: Recent Labs  Lab 12/22/23 1237 12/23/23 0507 12/24/23 1017 12/25/23 2259  NA 136 137 138 135  K 4.8 4.0 3.3* 4.2  CL 102 109 105 103  CO2 24 22 21* 19*  GLUCOSE 202* 110* 201* 204*  BUN 18 12 8 11   CREATININE 0.80 0.59 0.70 0.84  CALCIUM  9.6 8.1* 8.5* 8.5*  MG  --   --   --  1.8  PHOS  --   --   --  2.7   GFR: Estimated Creatinine Clearance: 33.8 mL/min (by C-G formula based on SCr of 0.84 mg/dL). Liver Function Tests: Recent Labs  Lab 12/22/23 1237 12/25/23 2259  AST 33 21  ALT 31 18  ALKPHOS 113 96  BILITOT 0.2 0.4  PROT 7.2 5.5*  ALBUMIN 2.7* 2.3*   No results for input(s): LIPASE, AMYLASE in the last 168 hours. No results for input(s): AMMONIA in the last 168 hours. Coagulation Profile: No results for input(s): INR, PROTIME in the last 168 hours. Cardiac Enzymes: No results for input(s): CKTOTAL, CKMB, CKMBINDEX, TROPONINI in the last 168 hours. BNP (last 3 results) No results for input(s): PROBNP in the last 8760 hours. HbA1C: No results for input(s): HGBA1C in the last 72 hours. CBG: Recent Labs  Lab 12/23/23 0852 12/23/23 1210 12/23/23 1627 12/23/23 2132 12/24/23 0847  GLUCAP 158* 133* 136* 141* 136*   Lipid Profile: No results for  input(s): CHOL, HDL, LDLCALC, TRIG, CHOLHDL, LDLDIRECT in the last 72 hours. Thyroid  Function Tests: No results for input(s): TSH, T4TOTAL, FREET4, T3FREE, THYROIDAB in the last 72 hours. Anemia Panel: No results for input(s): VITAMINB12, FOLATE, FERRITIN, TIBC, IRON , RETICCTPCT in the last 72 hours. Most Recent Urinalysis On File:     Component Value Date/Time   COLORURINE YELLOW (A) 12/24/2023 0956   APPEARANCEUR HAZY (A) 12/24/2023 0956   APPEARANCEUR Clear 06/13/2021 1019   LABSPEC 1.017 12/24/2023 0956   LABSPEC 1.010 12/15/2014 1926   PHURINE 5.0 12/24/2023 0956   GLUCOSEU >=500 (A) 12/24/2023 0956   GLUCOSEU NEGATIVE 12/15/2014 1926   HGBUR MODERATE (A) 12/24/2023 0956   BILIRUBINUR NEGATIVE 12/24/2023 0956   BILIRUBINUR Negative 06/13/2021 1019   BILIRUBINUR NEGATIVE 12/15/2014 1926   KETONESUR NEGATIVE 12/24/2023 0956   PROTEINUR NEGATIVE 12/24/2023 0956   NITRITE NEGATIVE 12/24/2023 0956   LEUKOCYTESUR SMALL (A) 12/24/2023 0956   LEUKOCYTESUR NEGATIVE 12/15/2014 1926   Sepsis Labs: @LABRCNTIP (procalcitonin:4,lacticidven:4) Microbiology: Recent Results (from the past 240 hours)  Blood Culture (routine x 2)     Status: None (Preliminary result)   Collection Time: 12/22/23 12:37 PM   Specimen: BLOOD  Result Value Ref Range Status   Specimen Description BLOOD BLOOD RIGHT FOREARM  Final   Special Requests   Final    BOTTLES DRAWN AEROBIC AND ANAEROBIC Blood Culture adequate volume   Culture   Final    NO GROWTH 4 DAYS Performed at [facility], [address] [place], [place] [address]    Report Status PENDING  Incomplete  Blood Culture (routine x 2)     Status: None (Preliminary result)   Collection Time: 12/22/23 12:37 PM   Specimen: BLOOD  Result Value Ref Range Status   Specimen Description BLOOD LEFT ANTECUBITAL  Final   Special Requests   Final    BOTTLES DRAWN AEROBIC AND ANAEROBIC Blood Culture adequate volume    Culture   Final    NO GROWTH 4 DAYS Performed at [facility], [address] [place], [place] [address]    Report Status PENDING  Incomplete  MRSA Next Gen by PCR, Nasal     Status: None   Collection Time: 12/22/23  3:43 PM   Specimen: Nasal Mucosa; Nasal Swab  Result Value Ref Range Status   MRSA by PCR Next Gen NOT DETECTED NOT DETECTED Final    Comment: (NOTE) The GeneXpert MRSA Assay (FDA approved for NASAL specimens only), is one component of a comprehensive MRSA colonization surveillance program. It is not intended to diagnose MRSA infection nor to guide or monitor treatment for MRSA infections. Test performance is not FDA approved in patients less than 73 years old. Performed at [facility], [address] [place], [place] [address]  Urine Culture     Status: Abnormal   Collection Time: 12/24/23  9:56 AM   Specimen: Urine, Clean Catch  Result Value Ref Range Status   Specimen Description   Final    URINE, CLEAN CATCH Performed at [facility], [address], [place], [place] [address]    Special Requests   Final    NONE Reflexed from [id number] Performed at [facility], [address] [place], [place] [address]    Culture (A)  Final    <10,000 COLONIES/mL INSIGNIFICANT GROWTH Performed at [facility], [address], [place], [place] [address]    Report Status 12/25/2023 FINAL  Final      Radiology Studies last 3 days: DG Chest Port 1 View Result Date: 12/22/2023 CLINICAL DATA:  Shortness of breath. EXAM: PORTABLE CHEST 1 VIEW COMPARISON:  Chest radiograph dated 12/07/2023. FINDINGS: Bilateral pulmonary opacities involving the right upper lung field and left lung. There is interval progression of right upper lung field opacity since the prior radiograph and CT. Small left pleural effusion. No pneumothorax. Top-normal cardiac silhouette. Atherosclerotic calcification of the aorta. No acute osseous pathology. Represent  IMPRESSION: 1. Interval progression of right upper lung field opacity. 2. Small left pleural effusion. Electronically Signed   By: Vanetta Chou M.D.   On: 12/22/2023 14:51       Time spent: 50 min     Darrelle Wiberg, DO [facility] 12/26/2023, 12:46 PM    Dictation software may have been used to generate the above note. Typos may occur and escape review in typed/dictated notes. Please contact Dr Marsa directly for clarity if needed.  Staff may message me via secure chat in Epic  but this may not receive an immediate response,  please page me for urgent matters!  If 7PM-7AM, please contact night coverage [email or web address]

## 2023-12-27 DIAGNOSIS — K922 Gastrointestinal hemorrhage, unspecified: Secondary | ICD-10-CM | POA: Diagnosis not present

## 2023-12-27 LAB — BASIC METABOLIC PANEL
Anion gap: 9 (ref 5–15)
BUN: 11 mg/dL (ref 8–23)
CO2: 25 mmol/L (ref 22–32)
Calcium: 8.9 mg/dL (ref 8.9–10.3)
Chloride: 104 mmol/L (ref 98–111)
Creatinine, Ser: 0.77 mg/dL (ref 0.44–1.00)
GFR, Estimated: >60 mL/min (ref >60)
Glucose, Bld: 158 mg/dL — ABNORMAL HIGH (ref 70–99)
Potassium: 4.7 mmol/L (ref 3.5–5.1)
Sodium: 138 mmol/L (ref 135–145)

## 2023-12-27 LAB — CULTURE, BLOOD (ROUTINE X 2)
Culture: NO GROWTH
Culture: NO GROWTH
Special Requests: ADEQUATE
Special Requests: ADEQUATE

## 2023-12-27 LAB — CBC
HCT: 24.7 % — ABNORMAL LOW (ref 36.0–46.0)
Hemoglobin: 7.9 g/dL — ABNORMAL LOW (ref 12.0–15.0)
MCH: 27.7 pg (ref 26.0–34.0)
MCHC: 32 g/dL (ref 30.0–36.0)
MCV: 86.7 fL (ref 80.0–100.0)
Platelets: 292 10*3/uL (ref 150–400)
RBC: 2.85 MIL/uL — ABNORMAL LOW (ref 3.87–5.11)
RDW: 17.7 % — ABNORMAL HIGH (ref 11.5–15.5)
WBC: 6.5 10*3/uL (ref 4.0–10.5)
nRBC: 0 % (ref 0.0–0.2)

## 2023-12-27 LAB — MAGNESIUM: Magnesium: 2 mg/dL (ref 1.7–2.4)

## 2023-12-27 LAB — PHOSPHORUS: Phosphorus: 3.1 mg/dL (ref 2.5–4.6)

## 2023-12-27 MED ORDER — ATORVASTATIN CALCIUM 20 MG PO TABS
40.0000 mg | ORAL_TABLET | Freq: Every day | ORAL | Status: DC
  Administered 2023-12-27 – 2023-12-28 (×2): 40 mg via ORAL
  Filled 2023-12-27 (×2): qty 2

## 2023-12-27 MED ORDER — PAROXETINE HCL 10 MG PO TABS
10.0000 mg | ORAL_TABLET | Freq: Every day | ORAL | Status: DC
  Administered 2023-12-27: 10 mg via ORAL
  Filled 2023-12-27 (×2): qty 1

## 2023-12-27 MED ORDER — CEFPODOXIME PROXETIL 200 MG PO TABS
200.0000 mg | ORAL_TABLET | Freq: Two times a day (BID) | ORAL | Status: AC
  Administered 2023-12-27 (×2): 200 mg via ORAL
  Filled 2023-12-27 (×2): qty 1

## 2023-12-27 NOTE — Plan of Care (Signed)
  Problem: Fluid Volume: Goal: Ability to maintain a balanced intake and output will improve Outcome: Progressing   Problem: Nutritional: Goal: Maintenance of adequate nutrition will improve Outcome: Progressing   Problem: Tissue Perfusion: Goal: Adequacy of tissue perfusion will improve Outcome: Progressing   Problem: Health Behavior/Discharge Planning: Goal: Ability to manage health-related needs will improve Outcome: Progressing

## 2023-12-27 NOTE — Progress Notes (Signed)
 PROGRESS NOTE    [name]   ZOX:[id number] DOB: Oct 10, 1929  DOA: 12/22/2023 Date of Service: 12/27/23 which is hospital day 5  PCP: Tasia Farr, [facility] course / significant events:   HPI: [name] is a 89 female with history of colon cancer, pulmonary nodule presumed malignant, history of PE on Eliquis , iron  deficiency anemia, GIB 09/2023 diverticular, chronic blood loss anemia, who presents to the emergency department for chief concerns of black stool x1 week, last Eliquis  taken AM 02/05. Was seen by Dr. Adrian Alba earlier 02/05 and sent to ED.   Of note, recent pulm embolism in January 2025, initially dx on CT outpatient done by oncology 01/07, at that time hesitant to anticoagulate given GIB Hx and referred to vascular surgery, initial plan was for IVC filter placement 01/28. However prior to that she was admitted at [facility] 01/21 w/ worsening thrombus burden compared to prior CT 01/07, therefore underwent thrombectomy and IVC placement inpatient on 01/22, was discharged from the hospital on 01/26. Has been on Eliquis  since then.    Relevant Hx GI - 10/05/2023 "colonoscopy by Dr. Corky Diener showed end-to-side ileocolonic anastomosis in the ascending colon diverticulosis of the colon. At that time she was admitted with melena and it was felt that the bleeding might have been diverticular"  02/05: in ED, Hgb 8.6 on admission (approx 11:30), dropping to 6.7 approx 19:00, transfusion ordered. Also tx for CAP. GI recommendations: high risk for anesthesia, monitor CBC / transfuse as needed, hold off on scope until at least 48h out from last Eliquis  dose, consider CTA w/ IR in interim if worsening bleeding.  02/06: Hgb to 8.4 at 05:00 --> 8.3 at 12:00 - stable 02/07: patient feeling "bad" this morning after working w/ PT, nauseous, otherwise cannot elaborate on symptoms and neg ROS. Not eating much. UA concerning for UTI, she is already on 3rd gen cephalosporin for pneumonia will  continue this pend cultures which may be neg anyway since on abx .  02/08: advancing diet, stool less black, Hgb down some but steady, continue monitor q12h 02/09: eating better, BM back to normal brown color, energy a bit better today, Hgb improved.  02/10: placement pending (today is Mon) bed search initiated      Consultants:  Gastroenterology   Procedures/Surgeries: none      ASSESSMENT & PLAN:   Upper GI bleed ABLA d/t GIB S/p 2 units PRBC transfusion on 02/05 High risk for anesthesia Protonix  80 mg IV bid Hold/dc home Eliquis   Gastroenterology following peripherally over the weekend  monitor CBC / transfuse as needed hold off on scope unless worse but she has been stable  consider CTA w/ IR if worsening bleeding Ok to advance diet    Hypotension d/t hypovolemia d/t GIB - resolved Responded to aggressive fluid and PRBC Hold antihypertensives GIB tx as above  Encourage po fluids    UA concerning for UTI / asymptomatic bacteriuria  she is already on 3rd gen cephalosporin for pneumonia will continue this, UCx insignificant growth   Pulmonary embolism  diagnosed in January 2025 Status post thrombectomy and IVC filter  Hold Eliquis  d/t GI bleed  Hypokalemia Replace as needed Monitor BMP  Primary insomnia Melatonin 5 mg nightly as needed for sleep   Hypertension associated with diabetes (HCC) Hydralazine  5 mg every 6 hours as needed for SBP > 175, 5 days ordered   Type 2 diabetes mellitus with hyperglycemia, without long-term current use of insulin  (HCC) Insulin   held given low po intake    Query CAP (community acquired pneumonia) Procalcitonin low Deescalate abx to po, given equivocal imaging and (+)significant coughing will treat for CAP   Iron  deficiency anemia due to chronic blood loss Baseline hemoglobin is 9.3-13.2 in the last 2 weeks See above re ABLA    No active concerns based on BMI: Body mass index is 20.85 kg/m.  However given cancer hx  and general debility, there is likely component of malnutrition  Underweight - under 18  overweight - 25 to 29 obese - 30 or more Class 1 obesity: BMI of 30.0 to 34 Class 2 obesity: BMI of 35.0 to 39 Class 3 obesity: BMI of 40.0 to 49 Super Morbid Obesity: BMI 50-59 Super-super Morbid Obesity: BMI 60+ Significantly low or high BMI is associated with higher medical risk.  Weight management advised as adjunct to other disease management and risk reduction treatments    DVT prophylaxis: SCD IV fluids: holding continuous IV fluids  Nutrition: CLD - anticipate may need emergent EGD/scope Central lines / invasive devices: none  Code Status: DNR ACP documentation reviewed:  none on file in [facility]  TOC needs: SNF rehab  Barriers to dispo / significant pending items: SNF placement will keep her here until at likely tomorrow/Weds             Subjective / Brief ROS:  Stool now brown Reprots some better energy Tolerating solids  Reports no concerns w/ urination/defecation.   Family Communication: family at bedisde on rounds     Objective Findings:  Vitals:   12/26/23 2330 12/27/23 0412 12/27/23 0738 12/27/23 1128  BP: 105/69 104/66 109/75 112/60  Pulse: 80 81 93 89  Resp: 16 16 18 18   Temp: 98.3 F (36.8 C) 98.2 F (36.8 C) 98.3 F (36.8 C) 98.1 F (36.7 C)  TempSrc: Oral  Oral Oral  SpO2: 97% 93% 94% 96%  Weight:      Height:        Intake/Output Summary (Last 24 hours) at 12/27/2023 1620 Last data filed at 12/27/2023 0900 Gross per 24 hour  Intake 120 ml  Output --  Net 120 ml    Filed Weights   12/22/23 1221  Weight: 51.7 kg    Examination:  Physical Exam Constitutional:      General: She is not in acute distress.    Appearance: She is not ill-appearing or diaphoretic.  Cardiovascular:     Rate and Rhythm: Normal rate and regular rhythm.     Heart sounds: Normal heart sounds.  Pulmonary:     Effort: Pulmonary effort is normal. No respiratory  distress.     Breath sounds: Normal breath sounds.  Abdominal:     General: Abdomen is flat. Bowel sounds are normal.     Palpations: Abdomen is soft.  Skin:    General: Skin is warm and dry.  Neurological:     General: No focal deficit present.     Mental Status: She is alert and oriented to person, place, and time. Mental status is at baseline.  Psychiatric:        Mood and Affect: Mood normal.        Behavior: Behavior normal.          Scheduled Medications:   atorvastatin   40 mg Oral Daily   cefpodoxime   200 mg Oral Q12H   pantoprazole  (PROTONIX ) IV  80 mg Intravenous BID   PARoxetine   10 mg Oral Daily   traZODone   100  mg Oral QHS    Continuous Infusions:    PRN Medications:  melatonin  Antimicrobials from admission:  Anti-infectives (From admission, onward)    Start     Dose/Rate Route Frequency Ordered Stop   12/27/23 1000  cefpodoxime  (VANTIN ) tablet 200 mg        200 mg Oral Every 12 hours 12/27/23 0830 12/27/23 2359   12/24/23 1000  azithromycin  (ZITHROMAX ) tablet 500 mg        500 mg Oral Daily 12/23/23 0736 12/26/23 1019   12/23/23 2000  cefpodoxime  (VANTIN ) tablet 200 mg  Status:  Discontinued        200 mg Oral Every 12 hours 12/23/23 1446 12/27/23 0830   12/23/23 1000  azithromycin  (ZITHROMAX ) 500 mg in sodium chloride  0.9 % 250 mL IVPB        500 mg 250 mL/hr over 60 Minutes Intravenous Every 24 hours 12/22/23 1543 12/23/23 1041   12/23/23 0400  ceFEPIme  (MAXIPIME ) 2 g in sodium chloride  0.9 % 100 mL IVPB  Status:  Discontinued        2 g 200 mL/hr over 30 Minutes Intravenous Every 12 hours 12/22/23 1559 12/23/23 1446   12/22/23 1515  vancomycin  (VANCOCIN ) IVPB 1000 mg/200 mL premix        1,000 mg 200 mL/hr over 60 Minutes Intravenous  Once 12/22/23 1514 12/22/23 1852   12/22/23 1515  ceFEPIme  (MAXIPIME ) 2 g in sodium chloride  0.9 % 100 mL IVPB        2 g 200 mL/hr over 30 Minutes Intravenous  Once 12/22/23 1514 12/22/23 1609   12/22/23 1515   azithromycin  (ZITHROMAX ) 500 mg in sodium chloride  0.9 % 250 mL IVPB        500 mg 250 mL/hr over 60 Minutes Intravenous  Once 12/22/23 1514 12/22/23 1736           Data Reviewed:  I have personally reviewed the following...  CBC: Recent Labs  Lab 12/22/23 1128 12/22/23 1850 12/23/23 0507 12/23/23 1212 12/24/23 1017 12/24/23 2003 12/25/23 0755 12/25/23 1929 12/26/23 0828 12/27/23 0540  WBC 10.5  --  6.4  --  5.8  --   --   --   --  6.5  NEUTROABS 8.2*  --   --   --  4.0  --   --   --   --   --   HGB 8.6*   < > 8.4*   < > 8.3* 7.9* 7.7* 8.3* 8.4* 7.9*  HCT 28.2*   < > 26.7*   < > 26.7* 24.5* 24.3* 26.5* 27.2* 24.7*  MCV 86.8  --  87.8  --  88.1  --   --   --   --  86.7  PLT 383  --  250  --  290  --   --   --   --  292   < > = values in this interval not displayed.   Basic Metabolic Panel: Recent Labs  Lab 12/22/23 1237 12/23/23 0507 12/24/23 1017 12/25/23 2259 12/27/23 0540  NA 136 137 138 135 138  K 4.8 4.0 3.3* 4.2 4.7  CL 102 109 105 103 104  CO2 24 22 21* 19* 25  GLUCOSE 202* 110* 201* 204* 158*  BUN 18 12 8 11 11   CREATININE 0.80 0.59 0.70 0.84 0.77  CALCIUM  9.6 8.1* 8.5* 8.5* 8.9  MG  --   --   --  1.8 2.0  PHOS  --   --   --  2.7 3.1   GFR: Estimated Creatinine Clearance: 35.5 mL/min (by C-G formula based on SCr of 0.77 mg/dL). Liver Function Tests: Recent Labs  Lab 12/22/23 1237 12/25/23 2259  AST 33 21  ALT 31 18  ALKPHOS 113 96  BILITOT 0.2 0.4  PROT 7.2 5.5*  ALBUMIN 2.7* 2.3*   No results for input(s): "LIPASE", "AMYLASE" in the last 168 hours. No results for input(s): "AMMONIA" in the last 168 hours. Coagulation Profile: No results for input(s): "INR", "PROTIME" in the last 168 hours. Cardiac Enzymes: No results for input(s): "CKTOTAL", "CKMB", "CKMBINDEX", "TROPONINI" in the last 168 hours. BNP (last 3 results) No results for input(s): "PROBNP" in the last 8760 hours. HbA1C: No results for input(s): "HGBA1C" in the last 72  hours. CBG: Recent Labs  Lab 12/23/23 0852 12/23/23 1210 12/23/23 1627 12/23/23 2132 12/24/23 0847  GLUCAP 158* 133* 136* 141* 136*   Lipid Profile: No results for input(s): "CHOL", "HDL", "LDLCALC", "TRIG", "CHOLHDL", "LDLDIRECT" in the last 72 hours. Thyroid  Function Tests: No results for input(s): "TSH", "T4TOTAL", "FREET4", "T3FREE", "THYROIDAB" in the last 72 hours. Anemia Panel: No results for input(s): "VITAMINB12", "FOLATE", "FERRITIN", "TIBC", "IRON ", "RETICCTPCT" in the last 72 hours. Most Recent Urinalysis On File:     Component Value Date/Time   COLORURINE YELLOW (A) 12/24/2023 0956   APPEARANCEUR HAZY (A) 12/24/2023 0956   APPEARANCEUR Clear 06/13/2021 1019   LABSPEC 1.017 12/24/2023 0956   LABSPEC 1.010 12/15/2014 1926   PHURINE 5.0 12/24/2023 0956   GLUCOSEU >=500 (A) 12/24/2023 0956   GLUCOSEU NEGATIVE 12/15/2014 1926   HGBUR MODERATE (A) 12/24/2023 0956   BILIRUBINUR NEGATIVE 12/24/2023 0956   BILIRUBINUR Negative 06/13/2021 1019   BILIRUBINUR NEGATIVE 12/15/2014 1926   KETONESUR NEGATIVE 12/24/2023 0956   PROTEINUR NEGATIVE 12/24/2023 0956   NITRITE NEGATIVE 12/24/2023 0956   LEUKOCYTESUR SMALL (A) 12/24/2023 0956   LEUKOCYTESUR NEGATIVE 12/15/2014 1926   Sepsis Labs: @LABRCNTIP (procalcitonin:4,lacticidven:4) Microbiology: Recent Results (from the past 240 hours)  Blood Culture (routine x 2)     Status: None   Collection Time: 12/22/23 12:37 PM   Specimen: BLOOD  Result Value Ref Range Status   Specimen Description BLOOD BLOOD RIGHT FOREARM  Final   Special Requests   Final    BOTTLES DRAWN AEROBIC AND ANAEROBIC Blood Culture adequate volume   Culture   Final    NO GROWTH 5 DAYS Performed at [facility], [address] [place], [place] [address]    Report Status 12/27/2023 FINAL  Final  Blood Culture (routine x 2)     Status: None   Collection Time: 12/22/23 12:37 PM   Specimen: BLOOD  Result Value Ref Range Status   Specimen  Description BLOOD LEFT ANTECUBITAL  Final   Special Requests   Final    BOTTLES DRAWN AEROBIC AND ANAEROBIC Blood Culture adequate volume   Culture   Final    NO GROWTH 5 DAYS Performed at [facility], [address], [place], [place] [address]    Report Status 12/27/2023 FINAL  Final  MRSA Next Gen by PCR, Nasal     Status: None   Collection Time: 12/22/23  3:43 PM   Specimen: Nasal Mucosa; Nasal Swab  Result Value Ref Range Status   MRSA by PCR Next Gen NOT DETECTED NOT DETECTED Final    Comment: (NOTE) The GeneXpert MRSA Assay (FDA approved for NASAL specimens only), is one component of a comprehensive MRSA colonization surveillance program. It is not intended to diagnose MRSA  infection nor to guide or monitor treatment for MRSA infections. Test performance is not FDA approved in patients less than 9 years old. Performed at [facility], [address] [place], [place] [address]   Urine Culture     Status: Abnormal   Collection Time: 12/24/23  9:56 AM   Specimen: Urine, Clean Catch  Result Value Ref Range Status   Specimen Description   Final    URINE, CLEAN CATCH Performed at [facility], [address], [place], [place] [address]    Special Requests   Final    NONE Reflexed from [id number] Performed at [facility], [address] [place], [place] [address]    Culture (A)  Final    <10,000 COLONIES/mL INSIGNIFICANT GROWTH Performed at [facility], [address], [place], [place] [address]    Report Status 12/25/2023 FINAL  Final      Radiology Studies last 3 days: No results found.      Kasandra Fehr, DO [facility] 12/27/2023, 4:20 PM    Dictation software may have been used to generate the above note. Typos may occur and escape review in typed/dictated notes. Please contact Dr Authur Leghorn directly for clarity if needed.  Staff may message me via secure chat in Epic  but this may not receive an  immediate response,  please page me for urgent matters!  If 7PM-7AM, please contact night coverage [email or web address]

## 2023-12-27 NOTE — NC FL2 (Signed)
 [place]  MEDICAID FL2 LEVEL OF CARE FORM     IDENTIFICATION  Patient Name: [name] Birthdate: 02-16-1929 Sex: female Admission Date (Current Location): 12/22/2023  [facility] and [place] Number:  Chiropodist and Address:  [facility], [address] [place], [place] [address]      Provider Number: [id number]  Attending Physician Name and Address:  Melodi Sprung, DO  Relative Name and Phone Number:  Clemmons,Catherine B (Daughter)  [phone] (Work Phone)    Current Level of Care: Hospital Recommended Level of Care: Skilled Nursing Facility Prior Approval Number:    Date Approved/Denied:   PASRR Number: [id number]  Discharge Plan:      Current Diagnoses: Patient Active Problem List   Diagnosis Date Noted   Upper GI bleed 12/22/2023   CAP (community acquired pneumonia) 12/22/2023   Hypotension 12/22/2023   Pulmonary embolism (HCC) 12/07/2023   DVT (deep venous thrombosis) (HCC) 12/07/2023   Acute pulmonary embolism (HCC) 12/04/2023   Essential hypertension 12/04/2023   Frail elderly 11/25/2023   Diabetes mellitus without complication (HCC) 11/25/2023   Depression, recurrent (HCC) 11/25/2023   Iron  deficiency anemia 10/19/2023   FTT (failure to thrive) in adult 10/13/2023   Iron  deficiency anemia due to chronic blood loss 10/13/2023   B12 deficiency 10/13/2023   Diverticulosis of colon with hemorrhage 10/07/2023   Acute GI bleeding 10/02/2023   Lung nodule 10/02/2023   Dizziness 07/21/2023   Stroke-like symptom 07/20/2023   Encounter for subsequent annual wellness visit (AWV) in Medicare patient 07/13/2023   Hypertension associated with diabetes (HCC) 07/13/2023   Hyperlipidemia 07/13/2023   Type 2 diabetes mellitus with hyperglycemia, without long-term current use of insulin  (HCC) 07/13/2023   Primary insomnia 07/13/2023   Colon cancer (HCC) 06/21/2023   Melena 02/15/2023   Acute blood loss anemia 02/15/2023    Chronic diastolic CHF (congestive heart failure) (HCC) 02/15/2023   Annual physical exam 07/06/2022   Aortic valve stenosis 08/05/2015   History of CVA (cerebrovascular accident) 08/05/2015   OP (osteoporosis) 08/05/2015   Superficial thrombophlebitis 08/05/2015   Generalized weakness 08/05/2015    Orientation RESPIRATION BLADDER Height & Weight     Self, Time, Situation, Place  Normal Continent Weight: 51.7 kg Height:  5\' 2"  (157.5 cm)  BEHAVIORAL SYMPTOMS/MOOD NEUROLOGICAL BOWEL NUTRITION STATUS  Other (Comment) (n/a)  (n/a) Continent Diet  AMBULATORY STATUS COMMUNICATION OF NEEDS Skin   Limited Assist Verbally Normal                       Personal Care Assistance Level of Assistance  Bathing, Dressing Bathing Assistance: Limited assistance   Dressing Assistance: Limited assistance     Functional Limitations Info  Sight, Hearing   Hearing Info: Impaired      SPECIAL CARE FACTORS FREQUENCY  PT (By licensed PT), OT (By licensed OT)     PT Frequency: Min 2x weekly OT Frequency: Min 2x weekly            Contractures Contractures Info: Not present    Additional Factors Info  Code Status Code Status Info: DNR             Current Medications (12/27/2023):  This is the current hospital active medication list Current Facility-Administered Medications  Medication Dose Route Frequency Provider Last Rate Last Admin   acetaminophen  (TYLENOL ) tablet 650 mg  650 mg Oral Q6H PRN Cox, Amy N, DO       Or   acetaminophen  (TYLENOL )  suppository 650 mg  650 mg Rectal Q6H PRN Cox, Amy N, DO       cefpodoxime  (VANTIN ) tablet 200 mg  200 mg Oral Q12H Alexander, Natalie, DO   200 mg at 12/27/23 4098   melatonin tablet 5 mg  5 mg Oral QHS PRN Cox, Amy N, DO   5 mg at 12/26/23 2209   ondansetron  (ZOFRAN ) tablet 4 mg  4 mg Oral Q6H PRN Cox, Amy N, DO       Or   ondansetron  (ZOFRAN ) injection 4 mg  4 mg Intravenous Q6H PRN Cox, Amy N, DO   4 mg at 12/26/23 1226    pantoprazole  (PROTONIX ) injection 80 mg  80 mg Intravenous BID Cox, Amy N, DO   80 mg at 12/27/23 1191   traZODone  (DESYREL ) tablet 100 mg  100 mg Oral QHS Ananias Balls, RPH   100 mg at 12/26/23 2209     Discharge Medications: Please see discharge summary for a list of discharge medications.  Relevant Imaging Results:  Relevant Lab Results:   Additional Information SSN# [id number]  Doryce Mcgregory C Kasir Hallenbeck, RN

## 2023-12-27 NOTE — Progress Notes (Signed)
 Occupational Therapy Treatment Patient Details Name: [name] MRN: [id number] DOB: May 23, 1929 Today's Date: 12/27/2023   History of present illness Pt is a 89 y.o. female presenting to hospital 12/22/23 for evaluation of GI bleeding.  Recent hospitalization with PE, DVT B LE's, and GI bleeding; s/p mechanical thrombectomy and IVC filter placement 12/08/23.  Pt admitted with upper GI bleed, hypotension, acute blood loss anemia, CAP, iron  deficiency anemia. PMH includes colon CA (s/p partial colectomy), htn, HLD, DM, diastolic CHF, stroke, B carotid artery stenosis, PSVT, IBS, L CEA, h/o GI bleed.   OT comments  Pt received in bed with son-in-law present. Son in law stating that pt recently climbed OOB to use [facility] despite alarm on and all four bedrails up. Pt educated on importance of asking for staff assistance as she is a falls risk. Pt completing bed mobility supervision level, sits EOB to perform grooming tasks with setup, and performs 3x sit to stands with CGA, RW & cues for hand placement. Pt declining any toileting needs at this time. Pt left with bed on middle setting, family present and needs within reach. Discharge recommendation appropriate. Patient will benefit from continued inpatient follow up therapy, <3 hours/day.        If plan is discharge home, recommend the following:  A little help with walking and/or transfers;A little help with bathing/dressing/bathroom;Assistance with cooking/housework;Direct supervision/assist for medications management;Direct supervision/assist for financial management;Assist for transportation;Help with stairs or ramp for entrance   Equipment Recommendations  None recommended by OT       Precautions / Restrictions Precautions Precautions: Fall Restrictions Weight Bearing Restrictions Per Provider Order: No       Mobility Bed Mobility Overal bed mobility: Needs Assistance Bed Mobility: Supine to Sit, Sit to Supine     Supine to sit:  Supervision, HOB elevated Sit to supine: Supervision   General bed mobility comments: increased effort to perform d/t fatigue    Transfers Overall transfer level: Needs assistance Equipment used: Rolling walker (2 wheels) Transfers: Sit to/from Stand Sit to Stand: Contact guard assist                 Balance Overall balance assessment: Needs assistance Sitting-balance support: No upper extremity supported, Feet supported Sitting balance-Leahy Scale: Good                                     ADL either performed or assessed with clinical judgement   ADL Overall ADL's : Needs assistance/impaired     Grooming: Set up;Sitting;Oral care;Wash/dry face;Wash/dry hands Grooming Details (indicate cue type and reason): with encourgement, sits EOB to wash face while NT assesses vitals                             Functional mobility during ADLs: Minimal assistance;Rolling walker (2 wheels) General ADL Comments: Pt participating in session with max encourgement; declines further ADL participation or walking in hallway.      Cognition Arousal: Alert Behavior During Therapy: WFL for tasks assessed/performed Overall Cognitive Status: Within Functional Limits for tasks assessed                                          Exercises Exercises: Other exercises Other Exercises Other Exercises: pt performing 3x sit  to stand transfers, cues for hand placement and CGA. steps laterally ~4x.            Pertinent Vitals/ Pain       Pain Assessment Pain Assessment: No/denies pain   Frequency  Min 1X/week        Progress Toward Goals  OT Goals(current goals can now be found in the care plan section)  Progress towards OT goals: Progressing toward goals  Acute Rehab OT Goals OT Goal Formulation: With patient/family Time For Goal Achievement: 01/07/24 Potential to Achieve Goals: Fair ADL Goals Pt Will Perform Grooming:  sitting;standing;with supervision Pt Will Perform Upper Body Bathing: with supervision;sitting Pt Will Perform Lower Body Bathing: sitting/lateral leans;sit to/from stand;with supervision Pt Will Perform Lower Body Dressing: with supervision;sitting/lateral leans;sit to/from stand Pt Will Transfer to Toilet: with supervision;ambulating;regular height toilet;grab bars Pt Will Perform Toileting - Clothing Manipulation and hygiene: with supervision;sit to/from stand;sitting/lateral leans Additional ADL Goal #1: Pt will demo 1 learned ECS used during ADL performance 3/3 trials to maximize IND and prevent overexertion.  Plan         AM-PAC OT "6 Clicks" Daily Activity     Outcome Measure   Help from another person eating meals?: None Help from another person taking care of personal grooming?: A Little Help from another person toileting, which includes using toliet, bedpan, or urinal?: A Little Help from another person bathing (including washing, rinsing, drying)?: A Little Help from another person to put on and taking off regular upper body clothing?: A Little Help from another person to put on and taking off regular lower body clothing?: A Little 6 Click Score: 19    End of Session Equipment Utilized During Treatment: Rolling walker (2 wheels)  OT Visit Diagnosis: Unsteadiness on feet (R26.81);History of falling (Z91.81);Muscle weakness (generalized) (M62.81)   Activity Tolerance Patient tolerated treatment well   Patient Left with family/visitor present;in bed;with call bell/phone within reach;with bed alarm set   Nurse Communication Mobility status        Time: 4098-1191 OT Time Calculation (min): 12 min  Charges: OT General Charges $OT Visit: 1 Visit OT Treatments $Self Care/Home Management : 8-22 mins  Aubery Date L. Jaimin Krupka, OTR/L  12/27/23, 12:39 PM

## 2023-12-27 NOTE — Care Management Important Message (Signed)
 Important Message  Patient Details  Name: [name] MRN: [id number] Date of Birth: 12/21/1928   Important Message Given:  Yes - Medicare IM     Felix Host 12/27/2023, 3:21 PM

## 2023-12-27 NOTE — TOC Progression Note (Signed)
 Transition of Care [facility] - Progression Note    Patient Details  Name: [name] MRN: [id number] Date of Birth: March 09, 1929  Transition of Care [facility]) CM/SW Contact  Baird Bombard, RN Phone Number: 12/27/2023, 11:07 AM  Clinical Narrative:    Spoke with patient and her Son-in-law regarding SNF recommendation. Patient is agreeable to SNF and would like [facility] as her first choice. She and family was advised once bed offer is secured a authorization from her insurance provider would be required.  PASSR obtained. FL2 completed. Bed search initiated.         Expected Discharge Plan and Services                                               Social Determinants of Health (SDOH) Interventions SDOH Screenings   Food Insecurity: No Food Insecurity (12/23/2023)  Housing: Low Risk  (12/23/2023)  Transportation Needs: No Transportation Needs (12/23/2023)  Utilities: Not At Risk (12/23/2023)  Alcohol Screen: Low Risk  (05/09/2020)  Depression (PHQ2-9): High Risk (11/25/2023)  Financial Resource Strain: Low Risk  (05/09/2020)  Physical Activity: Inactive (05/09/2020)  Social Connections: Moderately Integrated (12/23/2023)  Recent Concern: Social Connections - Moderately Isolated (12/08/2023)  Stress: No Stress Concern Present (05/09/2020)  Tobacco Use: Medium Risk (12/23/2023)    Readmission Risk Interventions    12/23/2023    8:45 AM 12/10/2023    4:22 PM  Readmission Risk Prevention Plan  Transportation Screening Complete Complete  PCP or Specialist Appt within 3-5 Days Complete Complete  HRI or Home Care Consult Complete Complete  Social Work Consult for Recovery Care Planning/Counseling Complete   Palliative Care Screening Not Applicable Not Applicable  Medication Review Oceanographer) Complete Complete

## 2023-12-27 NOTE — Progress Notes (Signed)
 PHARMACY CONSULT NOTE  Pharmacy Consult for Electrolyte Monitoring and Replacement   Recent Labs: Potassium (mmol/L)  Date Value  12/27/2023 4.7  12/17/2014 3.3 (L)   Magnesium (mg/dL)  Date Value  16/08/9603 2.0  12/17/2014 2.2   Calcium  (mg/dL)  Date Value  54/07/8118 8.9   Calcium , Total (mg/dL)  Date Value  14/78/2956 9.1   Albumin (g/dL)  Date Value  21/30/8657 2.3 (L)  10/13/2023 3.4 (L)  12/15/2014 4.0   Phosphorus (mg/dL)  Date Value  84/69/6295 3.1   Sodium (mmol/L)  Date Value  12/27/2023 138  10/13/2023 137  12/16/2014 142    Assessment: Pt is 89 yo female with upper GI bleed.  Pharmacy consulted to assist with electrolyte monitoring and replacement as indicated.  Goal of Therapy:  Electrolytes within normal limits  Plan:  No replacement needed F/u with AM labs.   Harlie Lieu, PharmD 12/27/2023 8:34 AM

## 2023-12-27 NOTE — Progress Notes (Signed)
 Physical Therapy Treatment Patient Details Name: [name] MRN: [id number] DOB: [birth year] Today's Date: 12/27/2023   History of Present Illness Pt is a 89 y.o. female presenting to hospital 12/22/23 for evaluation of GI bleeding.  Recent hospitalization with PE, DVT B LE's, and GI bleeding; s/p mechanical thrombectomy and IVC filter placement 12/08/23.  Pt admitted with upper GI bleed, hypotension, acute blood loss anemia, CAP, iron  deficiency anemia. PMH includes colon CA (s/p partial colectomy), htn, HLD, DM, diastolic CHF, stroke, B carotid artery stenosis, PSVT, IBS, L CEA, h/o GI bleed.    PT Comments  Pt resting in bed upon PT arrival; pt's daughter present; pt agreeable to therapy.  No c/o pain during session.  Pt currently SBA with bed mobility (increased effort to perform on own); CGA with transfers; CGA to min assist ambulating 30 feet with RW use; then CGA to ambulate 30 feet and then 10 feet x2 with RW use.  Pt requiring pacing and rest breaks d/t SOB, fatigue, and generalized weakness during sessions activities.  Will continue to focus on strengthening, increasing activity tolerance, and progressive functional mobility during hospitalization.   If plan is discharge home, recommend the following: A little help with walking and/or transfers;A little help with bathing/dressing/bathroom;Assistance with cooking/housework;Assist for transportation;Help with stairs or ramp for entrance   Can travel by private vehicle        Equipment Recommendations  Rolling walker (2 wheels) (Pt has RW at home already)    Recommendations for Other Services       Precautions / Restrictions Precautions Precautions: Fall Restrictions Weight Bearing Restrictions Per Provider Order: No     Mobility  Bed Mobility Overal bed mobility: Needs Assistance Bed Mobility: Supine to Sit, Sit to Supine     Supine to sit: Supervision, HOB elevated Sit to supine: Supervision, HOB elevated   General bed  mobility comments: increased effort to perform on own    Transfers Overall transfer level: Needs assistance Equipment used: Rolling walker (2 wheels) Transfers: Sit to/from Stand Sit to Stand: Contact guard assist           General transfer comment: initial vc's for UE placement for safe transfers    Ambulation/Gait Ambulation/Gait assistance: Min assist, Contact guard assist Gait Distance (Feet):  (30 feet x2; 10 feet x2 (to/from bathroom)) Assistive device: Rolling walker (2 wheels) Gait Pattern/deviations: Step-through pattern, Decreased step length - right, Decreased step length - left Gait velocity: decreased     General Gait Details: CGA to min assist to ambulate 1st trial (30 feet) with RW use (assist to steady); CGA for rest of ambulation trials; limited distance d/t pt fatigue   Stairs             Wheelchair Mobility     Tilt Bed    Modified Rankin (Stroke Patients Only)       Balance Overall balance assessment: Needs assistance Sitting-balance support: No upper extremity supported, Feet supported Sitting balance-Leahy Scale: Good Sitting balance - Comments: steady reaching within BOS   Standing balance support: During functional activity, Reliant on assistive device for balance, Bilateral upper extremity supported Standing balance-Leahy Scale: Fair Standing balance comment: steady static standing balance with RW use                            Cognition Arousal: Alert Behavior During Therapy: WFL for tasks assessed/performed Overall Cognitive Status: Within Functional Limits for tasks assessed  Exercises      General Comments  Nursing cleared pt for participation in physical therapy.  Pt agreeable to PT session.      Pertinent Vitals/Pain Pain Assessment Pain Assessment: No/denies pain HR 97-119 bpm and SpO2 sats 92% or greater on room air during sessions activities.     Home Living                          Prior Function            PT Goals (current goals can now be found in the care plan section) Acute Rehab PT Goals Patient Stated Goal: to improve strength and walking PT Goal Formulation: With patient Time For Goal Achievement: 01/07/24 Potential to Achieve Goals: Good Progress towards PT goals: Progressing toward goals    Frequency    Min 1X/week      PT Plan      Co-evaluation              AM-PAC PT "6 Clicks" Mobility   Outcome Measure  Help needed turning from your back to your side while in a flat bed without using bedrails?: None Help needed moving from lying on your back to sitting on the side of a flat bed without using bedrails?: A Little Help needed moving to and from a bed to a chair (including a wheelchair)?: A Little Help needed standing up from a chair using your arms (e.g., wheelchair or bedside chair)?: A Little Help needed to walk in hospital room?: A Little Help needed climbing 3-5 steps with a railing? : A Lot 6 Click Score: 18    End of Session Equipment Utilized During Treatment: Gait belt Activity Tolerance: Patient limited by fatigue Patient left: in bed;with call bell/phone within reach;with family/visitor present (Family reports no alarm on when family is in room and they will let nursing know when they leave so they can put alarm back on) Nurse Communication: Mobility status;Precautions PT Visit Diagnosis: Unsteadiness on feet (R26.81);Other abnormalities of gait and mobility (R26.89);Muscle weakness (generalized) (M62.81);History of falling (Z91.81)     Time: 1610-9604 PT Time Calculation (min) (ACUTE ONLY): 32 min  Charges:    $Therapeutic Activity: 23-37 mins PT General Charges $$ ACUTE PT VISIT: 1 Visit                     Amador Junes, PT 12/27/23, 2:06 PM

## 2023-12-28 DIAGNOSIS — K922 Gastrointestinal hemorrhage, unspecified: Secondary | ICD-10-CM | POA: Diagnosis not present

## 2023-12-28 LAB — RENAL FUNCTION PANEL
Albumin: 2.6 g/dL — ABNORMAL LOW (ref 3.5–5.0)
Anion gap: 10 (ref 5–15)
BUN: 14 mg/dL (ref 8–23)
CO2: 25 mmol/L (ref 22–32)
Calcium: 8.7 mg/dL — ABNORMAL LOW (ref 8.9–10.3)
Chloride: 101 mmol/L (ref 98–111)
Creatinine, Ser: 0.77 mg/dL (ref 0.44–1.00)
GFR, Estimated: >60 mL/min (ref >60)
Glucose, Bld: 192 mg/dL — ABNORMAL HIGH (ref 70–99)
Phosphorus: 2.5 mg/dL (ref 2.5–4.6)
Potassium: 4.3 mmol/L (ref 3.5–5.1)
Sodium: 136 mmol/L (ref 135–145)

## 2023-12-28 LAB — HEMOGLOBIN AND HEMATOCRIT, BLOOD
HCT: 25.6 % — ABNORMAL LOW (ref 36.0–46.0)
Hemoglobin: 8.1 g/dL — ABNORMAL LOW (ref 12.0–15.0)

## 2023-12-28 LAB — MAGNESIUM: Magnesium: 2.1 mg/dL (ref 1.7–2.4)

## 2023-12-28 MED ORDER — PANTOPRAZOLE SODIUM 40 MG PO TBEC: 40.0000 mg | DELAYED_RELEASE_TABLET | Freq: Two times a day (BID) | ORAL | 0 refills | Status: DC

## 2023-12-28 NOTE — Discharge Summary (Signed)
Physician Discharge Summary   Patient: [name] MRN: [id number]  DOB: [birth year]   Admit:     Date of Admission: 12/22/2023 Admitted from: home   Discharge: Date of discharge: 12/28/23 Disposition: Home health Condition at discharge: good  CODE STATUS: DNR     Discharge Physician: Sunnie Nielsen, DO [facility]     PCP: Sallee Provencal, FNP  Recommendations for Outpatient Follow-up:  Follow up with PCP Sallee Provencal, FNP in 1-2 weeks Follow up with GI  Follow up with hematology for iron infusion - may benefit from earlier than scheduled   Discharge Instructions     Diet - low sodium heart healthy   Complete by: As directed    Increase activity slowly   Complete by: As directed          Discharge Diagnoses: Principal Problem:   Upper GI bleed Active Problems:   Acute blood loss anemia   Acute GI bleeding   Hypotension   Colon cancer (HCC)   History of CVA (cerebrovascular accident)   Hypertension associated with diabetes (HCC)   Hyperlipidemia   Primary insomnia   Type 2 diabetes mellitus with hyperglycemia, without long-term current use of insulin (HCC)   Lung nodule   Melena   FTT (failure to thrive) in adult   Iron deficiency anemia due to chronic blood loss   Depression, recurrent (HCC)   Essential hypertension   Pulmonary embolism (HCC)   CAP (community acquired pneumonia)        Hospital course / significant events:   HPI: [name] is a 89 female with history of colon cancer, pulmonary nodule presumed malignant, history of PE on Eliquis, iron deficiency anemia, GIB 09/2023 diverticular, chronic blood loss anemia, who presents to the emergency department for chief concerns of black stool x1 week, last Eliquis taken AM 02/05. Was seen by Dr. Orlie Dakin earlier 02/05 and sent to ED.   Of note, recent pulm embolism in January 2025, initially dx on CT outpatient done by oncology 01/07, at that time hesitant to  anticoagulate given GIB Hx and referred to vascular surgery, initial plan was for IVC filter placement 01/28. However prior to that she was admitted at [facility] 01/21 w/ worsening thrombus burden compared to prior CT 01/07, therefore underwent thrombectomy and IVC placement inpatient on 01/22, was discharged from the hospital on 01/26. Has been on Eliquis since then.    Relevant Hx GI - 10/05/2023 "colonoscopy by Dr. Norma Fredrickson showed end-to-side ileocolonic anastomosis in the ascending colon diverticulosis of the colon. At that time she was admitted with melena and it was felt that the bleeding might have been diverticular"  02/05: in ED, Hgb 8.6 on admission (approx 11:30), dropping to 6.7 approx 19:00, transfusion ordered. Also tx for CAP. GI recommendations: high risk for anesthesia, monitor CBC / transfuse as needed, hold off on scope until at least 48h out from last Eliquis dose, consider CTA w/ IR in interim if worsening bleeding.  02/06: Hgb to 8.4 at 05:00 --> 8.3 at 12:00 - stable 02/07: patient feeling "bad" this morning after working w/ PT, nauseous, otherwise cannot elaborate on symptoms and neg ROS. Not eating much. UA concerning for UTI, she is already on 3rd gen cephalosporin for pneumonia will continue this pend cultures which may be neg anyway since on abx .  02/08: advancing diet, stool less black, Hgb down some but steady, continue monitor q12h. Pending SNF placement medically stable. 02/09: eating better, BM  back to normal brown color, energy a bit better today, Hgb improved.   02/10: placement pending (today is Mon) bed search initiated  02/11: pt now would like discharge w/ home health, this was arrnaged and pt dc      Consultants:  Gastroenterology   Procedures/Surgeries: none      ASSESSMENT & PLAN:   Upper GI bleed ABLA d/t GIB S/p 2 units PRBC transfusion on 02/05 High risk for anesthesia Protonix 40 mg po bid  Stop Eliquis  Gastroenterology following outpatient   monitor CBC / transfuse as needed Follow w/ hematology may benefit from iron infusion earlier than currently scheduled   Hypotension d/t hypovolemia d/t GIB - resolved Hold antihypertensives - see med rec  GIB tx as above   UA concerning for UTI / asymptomatic bacteriuria  she is already on 3rd gen cephalosporin for pneumonia will continue this, UCx insignificant growth  Completed 5 days abx   Pulmonary embolism  diagnosed in January 2025 Status post thrombectomy and IVC filter  DC Eliquis d/t GI bleed  Hypokalemia Monitor BMP  Primary insomnia Melatonin 5 mg nightly as needed for sleep   Type 2 diabetes mellitus with hyperglycemia, without long-term current use of insulin (HCC) Insulin held given low po intake  Restart glipizide on dc since intake better  Follow PCP   Query CAP (community acquired pneumonia) Procalcitonin low Deescalate abx to po, given equivocal imaging and (+)significant coughing will treat for CAP 5 days abx completed    Iron deficiency anemia due to chronic blood loss Baseline hemoglobin is 9.3-13.2 in the last 2 weeks See above re ABLA             Discharge Instructions  Allergies as of 12/28/2023       Reactions   Codeine    Nitrofurantoin    Other reaction(s): Other (See Comments)   Tramadol Nausea Only   Doxycycline Rash   Penicillins Swelling, Rash, Other (See Comments)   Throat swelling    Sulfa Antibiotics Rash        Medication List     STOP taking these medications    apixaban 2.5 MG Tabs tablet Commonly known as: ELIQUIS       TAKE these medications    atorvastatin 40 MG tablet Commonly known as: LIPITOR Take 1 tablet (40 mg total) by mouth daily.   cholecalciferol 25 MCG (1000 UNIT) tablet Commonly known as: VITAMIN D3 Take 1,000 Units by mouth daily.   glipiZIDE 5 MG 24 hr tablet Commonly known as: GLUCOTROL XL Take 1 tablet (5 mg total) by mouth daily with breakfast.   magic mouthwash  Soln Take 5 mLs by mouth 4 (four) times daily. Suspension contains equal amounts of Maalox Extra Strength, nystatin, and diphenhydramine.   MIRALAX PO Take by mouth.   pantoprazole 40 MG tablet Commonly known as: PROTONIX Take 1 tablet (40 mg total) by mouth 2 (two) times daily. What changed: when to take this   PARoxetine 10 MG tablet Commonly known as: Paxil Take 1 tablet (10 mg total) by mouth daily.   simethicone 125 MG chewable tablet Commonly known as: MYLICON Chew 1 tablet (125 mg total) by mouth every 6 (six) hours as needed for flatulence.   temazepam 15 MG capsule Commonly known as: RESTORIL TAKE 1 CAPSULE BY MOUTH AT BEDTIME AS NEEDED FOR SLEEP         Follow-up Information     [facility] Follow up.   Specialty: Home Health  Services Why: They will resume home health services at discharge. Contact information: [address] [place] [address] [phone]         Jeralyn Ruths, MD. Call.   Specialty: Oncology Why: discuss posisbly getting iron infusion sooner than scheduled, due to GI bleed hospitalization Contact information: [address] [place] [address] [phone]         Stanton Kidney, MD. Call.   Specialty: Gastroenterology Why: hospital follow up appointment - GI bleed follow up Contact information: [address] [place] [address] [phone]                 Allergies  Allergen Reactions   Codeine    Nitrofurantoin     Other reaction(s): Other (See Comments)   Tramadol Nausea Only   Doxycycline Rash   Penicillins Swelling, Rash and Other (See Comments)    Throat swelling    Sulfa Antibiotics Rash     Subjective: Pt reports fatigue but improving, she would like to go home w/ home health. No other concerns today    Discharge Exam: BP 125/71 (BP Location: Left Arm)   Pulse 97   Temp 98.6 F (37 C)   Resp 18   Ht 5\' 2"  (1.575 m)   Wt 51.7 kg   SpO2 93%    BMI 20.85 kg/m  General: Pt is alert, awake, not in acute distress Cardiovascular: RRR, S1/S2 +, no rubs, no gallops Respiratory: CTA bilaterally Abdominal: Soft, NT, ND, bowel sounds + Extremities: no edema, no cyanosis     The results of significant diagnostics from this hospitalization (including imaging, microbiology, ancillary and laboratory) are listed below for reference.     Microbiology: Recent Results (from the past 240 hours)  Blood Culture (routine x 2)     Status: None   Collection Time: 12/22/23 12:37 PM   Specimen: BLOOD  Result Value Ref Range Status   Specimen Description BLOOD BLOOD RIGHT FOREARM  Final   Special Requests   Final    BOTTLES DRAWN AEROBIC AND ANAEROBIC Blood Culture adequate volume   Culture   Final    NO GROWTH 5 DAYS Performed at [facility], [address] [place], [place] [address]    Report Status 12/27/2023 FINAL  Final  Blood Culture (routine x 2)     Status: None   Collection Time: 12/22/23 12:37 PM   Specimen: BLOOD  Result Value Ref Range Status   Specimen Description BLOOD LEFT ANTECUBITAL  Final   Special Requests   Final    BOTTLES DRAWN AEROBIC AND ANAEROBIC Blood Culture adequate volume   Culture   Final    NO GROWTH 5 DAYS Performed at [facility], [address], [place], [place] [address]    Report Status 12/27/2023 FINAL  Final  MRSA Next Gen by PCR, Nasal     Status: None   Collection Time: 12/22/23  3:43 PM   Specimen: Nasal Mucosa; Nasal Swab  Result Value Ref Range Status   MRSA by PCR Next Gen NOT DETECTED NOT DETECTED Final    Comment: (NOTE) The GeneXpert MRSA Assay (FDA approved for NASAL specimens only), is one component of a comprehensive MRSA colonization surveillance program. It is not intended to diagnose MRSA infection nor to guide or monitor treatment for MRSA infections. Test performance is not FDA approved in patients less than 62 years old. Performed at [facility], [address] [place], [place]  [address]   Urine Culture     Status: Abnormal   Collection Time: 12/24/23  9:56 AM   Specimen: Urine, Clean Catch  Result Value Ref Range Status   Specimen Description   Final    URINE, CLEAN CATCH Performed at [facility], [address] [place], [place] [address]    Special Requests   Final    NONE Reflexed from [id number] Performed at [facility], [address] [place], [place] [address]    Culture (A)  Final    <10,000 COLONIES/mL INSIGNIFICANT GROWTH Performed at [facility], [address], [place], [place] [address]    Report Status 12/25/2023 FINAL  Final     Labs: BNP (last 3 results) Recent Labs    07/13/23 0935 10/02/23 1633  BNP 110.0* 243.1*   Basic Metabolic Panel: Recent Labs  Lab 12/23/23 0507 12/24/23 1017 12/25/23 2259 12/27/23 0540 12/28/23 0204  NA 137 138 135 138 136  K 4.0 3.3* 4.2 4.7 4.3  CL 109 105 103 104 101  CO2 22 21* 19* 25 25  GLUCOSE 110* 201* 204* 158* 192*  BUN 12 8 11 11 14   CREATININE 0.59 0.70 0.84 0.77 0.77  CALCIUM 8.1* 8.5* 8.5* 8.9 8.7*  MG  --   --  1.8 2.0 2.1  PHOS  --   --  2.7 3.1 2.5   Liver Function Tests: Recent Labs  Lab 12/22/23 1237 12/25/23 2259 12/28/23 0204  AST 33 21  --   ALT 31 18  --   ALKPHOS 113 96  --   BILITOT 0.2 0.4  --   PROT 7.2 5.5*  --   ALBUMIN 2.7* 2.3* 2.6*   No results for input(s): "LIPASE", "AMYLASE" in the last 168 hours. No results for input(s): "AMMONIA" in the last 168 hours. CBC: Recent Labs  Lab 12/22/23 1128 12/22/23 1850 12/23/23 0507 12/23/23 1212 12/24/23 1017 12/24/23 2003 12/25/23 0755 12/25/23 1929 12/26/23 0828 12/27/23 0540 12/28/23 0204  WBC 10.5  --  6.4  --  5.8  --   --   --   --  6.5  --   NEUTROABS 8.2*  --   --   --  4.0  --   --   --   --   --   --   HGB 8.6*   < > 8.4*   < > 8.3*   < > 7.7* 8.3* 8.4* 7.9* 8.1*  HCT 28.2*   < > 26.7*   < > 26.7*   < > 24.3* 26.5* 27.2* 24.7*  25.6*  MCV 86.8  --  87.8  --  88.1  --   --   --   --  86.7  --   PLT 383  --  250  --  290  --   --   --   --  292  --    < > = values in this interval not displayed.   Cardiac Enzymes: No results for input(s): "CKTOTAL", "CKMB", "CKMBINDEX", "TROPONINI" in the last 168 hours. BNP: Invalid input(s): "POCBNP" CBG: Recent Labs  Lab 12/23/23 0852 12/23/23 1210 12/23/23 1627 12/23/23 2132 12/24/23 0847  GLUCAP 158* 133* 136* 141* 136*   D-Dimer No results for input(s): "DDIMER" in the last 72 hours. Hgb A1c No results for input(s): "HGBA1C" in the last 72 hours. Lipid Profile No results for input(s): "CHOL", "HDL", "LDLCALC", "TRIG", "CHOLHDL", "LDLDIRECT" in the last 72 hours. Thyroid function studies  No results for input(s): "TSH", "T4TOTAL", "T3FREE", "THYROIDAB" in the last 72 hours.  Invalid input(s): "FREET3" Anemia work up No results for input(s): "VITAMINB12", "FOLATE", "FERRITIN", "TIBC", "IRON", "RETICCTPCT" in the last 72 hours. Urinalysis    Component Value Date/Time   COLORURINE YELLOW (A) 12/24/2023 0956   APPEARANCEUR HAZY (A) 12/24/2023 0956   APPEARANCEUR Clear 06/13/2021 1019   LABSPEC 1.017 12/24/2023 0956   LABSPEC 1.010 12/15/2014 1926   PHURINE 5.0 12/24/2023 0956   GLUCOSEU >=500 (A) 12/24/2023 0956   GLUCOSEU NEGATIVE 12/15/2014 1926   HGBUR MODERATE (A) 12/24/2023 0956   BILIRUBINUR NEGATIVE 12/24/2023 0956   BILIRUBINUR Negative 06/13/2021 1019   BILIRUBINUR NEGATIVE 12/15/2014 1926   KETONESUR NEGATIVE 12/24/2023 0956   PROTEINUR NEGATIVE 12/24/2023 0956   NITRITE NEGATIVE 12/24/2023 0956   LEUKOCYTESUR SMALL (A) 12/24/2023 0956   LEUKOCYTESUR NEGATIVE 12/15/2014 1926   Sepsis Labs Recent Labs  Lab 12/22/23 1128 12/23/23 0507 12/24/23 1017 12/27/23 0540  WBC 10.5 6.4 5.8 6.5   Microbiology Recent Results (from the past 240 hours)  Blood Culture (routine x 2)     Status: None   Collection Time: 12/22/23 12:37 PM   Specimen:  BLOOD  Result Value Ref Range Status   Specimen Description BLOOD BLOOD RIGHT FOREARM  Final   Special Requests   Final    BOTTLES DRAWN AEROBIC AND ANAEROBIC Blood Culture adequate volume   Culture   Final    NO GROWTH 5 DAYS Performed at [facility], [address], [place], [place] [address]    Report Status 12/27/2023 FINAL  Final  Blood Culture (routine x 2)     Status: None   Collection Time: 12/22/23 12:37 PM   Specimen: BLOOD  Result Value Ref Range Status   Specimen Description BLOOD LEFT ANTECUBITAL  Final   Special Requests   Final    BOTTLES DRAWN AEROBIC AND ANAEROBIC Blood Culture adequate volume   Culture   Final    NO GROWTH 5 DAYS Performed at [facility], [address] [place], [place] [address]    Report Status 12/27/2023 FINAL  Final  MRSA Next Gen by PCR, Nasal     Status: None   Collection Time: 12/22/23  3:43 PM   Specimen: Nasal Mucosa; Nasal Swab  Result Value Ref Range Status   MRSA by PCR Next Gen NOT DETECTED NOT DETECTED Final    Comment: (NOTE) The GeneXpert MRSA Assay (FDA approved for NASAL specimens only), is one component of a comprehensive MRSA colonization surveillance program. It is not intended to diagnose MRSA infection nor to guide or monitor treatment for MRSA infections. Test performance is not FDA approved in patients less than 40 years old. Performed at [facility], [address], [place], [place] [address]   Urine Culture     Status: Abnormal   Collection Time: 12/24/23  9:56 AM   Specimen: Urine, Clean Catch  Result Value Ref Range Status   Specimen Description   Final    URINE, CLEAN CATCH Performed at [facility], [address], [place], [place] [address]    Special Requests   Final    NONE Reflexed from [id number] Performed at [facility], [address] [place], [place] [address]    Culture (A)  Final    <10,000 COLONIES/mL INSIGNIFICANT GROWTH Performed at [facility], [address], [place], [place] [address]    Report Status 12/25/2023 FINAL  Final  Imaging DG Chest Port 1 View Result Date: 12/22/2023 CLINICAL DATA:  Shortness of breath. EXAM: PORTABLE CHEST 1 VIEW COMPARISON:  Chest radiograph dated 12/07/2023. FINDINGS: Bilateral pulmonary opacities involving the right upper lung field and left lung. There is interval progression of right upper lung field opacity since the prior radiograph and CT. Small left pleural effusion. No pneumothorax. Top-normal cardiac silhouette. Atherosclerotic calcification of the aorta. No acute osseous pathology. Represent IMPRESSION: 1. Interval progression of right upper lung field opacity. 2. Small left pleural effusion. Electronically Signed   By: Elgie Collard M.D.   On: 12/22/2023 14:51      Time coordinating discharge: over 30 minutes  SIGNED:  Sunnie Nielsen DO [facility]

## 2023-12-28 NOTE — Progress Notes (Signed)
PHARMACY CONSULT NOTE  Pharmacy Consult for Electrolyte Monitoring and Replacement   Recent Labs: Potassium (mmol/L)  Date Value  12/28/2023 4.3  12/17/2014 3.3 (L)   Magnesium (mg/dL)  Date Value  16/08/9603 2.1  12/17/2014 2.2   Calcium (mg/dL)  Date Value  54/07/8118 8.7 (L)   Calcium, Total (mg/dL)  Date Value  14/78/2956 9.1   Albumin (g/dL)  Date Value  21/30/8657 2.6 (L)  10/13/2023 3.4 (L)  12/15/2014 4.0   Phosphorus (mg/dL)  Date Value  84/69/6295 2.5   Sodium (mmol/L)  Date Value  12/28/2023 136  10/13/2023 137  12/16/2014 142    Assessment: Pt is 89 yo female with upper GI bleed.  Pharmacy consulted to assist with electrolyte monitoring and replacement as indicated.  Goal of Therapy:  Electrolytes within normal limits  Plan:  No replacement needed. Electrolytes stable. Pharmacy will sign off. Please re-consult if needed.   Paschal Dopp, PharmD 12/28/2023 7:56 AM

## 2023-12-28 NOTE — TOC Transition Note (Signed)
Transition of Care [facility]) - Discharge Note   Patient Details  Name: [name] MRN: [id number] Date of Birth: 1928/12/06  Transition of Care [facility]) CM/SW Contact:  Margarito Liner, LCSW Phone Number: 12/28/2023, 1:37 PM   Clinical Narrative:   Patient has orders to discharge home today. Patient and family have decided to return home with home health rather than go to SNF. She is already active with [facility] for PT, OT, RN. [facility] liaison is aware of plan to discharge today. Family confirmed she has all needed DME at home. No further concerns. Family will transport. CSW signing off.  Final next level of care: Home w Home Health Services Barriers to Discharge: Barriers Resolved   Patient Goals and CMS Choice            Discharge Placement                Patient to be transferred to facility by: Family Name of family member notified: Daughter and daughter-in-law Patient and family notified of of transfer: 12/28/23  Discharge Plan and Services Additional resources added to the After Visit Summary for                            [facility] Arranged: RN, PT, OT [facility] Agency: [facility] Date [facility] Agency Contacted: 12/28/23   Representative spoke with at [facility] Agency: Gearldine Bienenstock  Social Drivers of Health (SDOH) Interventions SDOH Screenings   Food Insecurity: No Food Insecurity (12/23/2023)  Housing: Low Risk  (12/23/2023)  Transportation Needs: No Transportation Needs (12/23/2023)  Utilities: Not At Risk (12/23/2023)  Alcohol Screen: Low Risk  (05/09/2020)  Depression (PHQ2-9): High Risk (11/25/2023)  Financial Resource Strain: Low Risk  (05/09/2020)  Physical Activity: Inactive (05/09/2020)  Social Connections: Moderately Integrated (12/23/2023)  Recent Concern: Social Connections - Moderately Isolated (12/08/2023)  Stress: No Stress Concern Present (05/09/2020)  Tobacco Use: Medium Risk (12/23/2023)     Readmission Risk Interventions    12/23/2023    8:45 AM 12/10/2023     4:22 PM  Readmission Risk Prevention Plan  Transportation Screening Complete Complete  PCP or Specialist Appt within 3-5 Days Complete Complete  HRI or Home Care Consult Complete Complete  Social Work Consult for Recovery Care Planning/Counseling Complete   Palliative Care Screening Not Applicable Not Applicable  Medication Review Oceanographer) Complete Complete

## 2023-12-28 NOTE — Progress Notes (Signed)
Physical Therapy Treatment Patient Details Name: [name] MRN: [id number] DOB: 02/11/1929 Today's Date: 12/28/2023   History of Present Illness Pt is a 89 y.o. female presenting to hospital 12/22/23 for evaluation of GI bleeding.  Recent hospitalization with PE, DVT B LE's, and GI bleeding; s/p mechanical thrombectomy and IVC filter placement 12/08/23.  Pt admitted with upper GI bleed, hypotension, acute blood loss anemia, CAP, iron deficiency anemia. PMH includes colon CA (s/p partial colectomy), htn, HLD, DM, diastolic CHF, stroke, B carotid artery stenosis, PSVT, IBS, L CEA, h/o GI bleed.    PT Comments  Pt sitting EOB getting a bath with assist of DIL.  She is able to stand to RW and assist with pericare with 1 hand walker support.  She is able to walk 40' in hallway with RW and cga +1 then stops at bathroom to void before walking back around bed to chair.  Remained up with family in room and needs met.  Discussed discharge plan with family.  SNF has been recommended but is progressing towards goals.  Family to decide if they still want to transition to SNF or change to home.  They do voice concern that she is not at baseline and due to second admission and a variety of health issues that still need to resolve they are hesitant.  Encouraged to let RN know if they want to go home.  Will benefit from continued HHPT/OT and aide services and +24 hour assist if they do opt for home.   If plan is discharge home, recommend the following: A little help with walking and/or transfers;A little help with bathing/dressing/bathroom;Assistance with cooking/housework;Assist for transportation;Help with stairs or ramp for entrance   Can travel by private vehicle        Equipment Recommendations  Rolling walker (2 wheels);BSC/3in1    Recommendations for Other Services       Precautions / Restrictions Precautions Precautions: Fall Restrictions Weight Bearing Restrictions Per Provider Order: No      Mobility  Bed Mobility               General bed mobility comments: at EOB upon arrival with DIL then in chair after session Patient Response: Cooperative  Transfers Overall transfer level: Needs assistance Equipment used: Rolling walker (2 wheels) Transfers: Sit to/from Stand Sit to Stand: Contact guard assist                Ambulation/Gait Ambulation/Gait assistance: Contact guard assist Gait Distance (Feet): 40 Feet Assistive device: Rolling walker (2 wheels) Gait Pattern/deviations: Step-through pattern, Decreased step length - right, Decreased step length - left Gait velocity: decreased     General Gait Details: generally slow but steady with +1 assist   Stairs             Wheelchair Mobility     Tilt Bed Tilt Bed Patient Response: Cooperative  Modified Rankin (Stroke Patients Only)       Balance Overall balance assessment: Needs assistance Sitting-balance support: No upper extremity supported, Feet supported Sitting balance-Leahy Scale: Good     Standing balance support: During functional activity, Reliant on assistive device for balance, Bilateral upper extremity supported Standing balance-Leahy Scale: Fair Standing balance comment: steady static standing balance with RW use                            Communication    Cognition Arousal: Alert Behavior During Therapy: [facility] for tasks assessed/performed  PT - Cognitive impairments: No apparent impairments                                Cueing    Exercises Other Exercises Other Exercises: to bathroom to void    General Comments        Pertinent Vitals/Pain Pain Assessment Pain Assessment: No/denies pain    Home Living                          Prior Function            PT Goals (current goals can now be found in the care plan section) Progress towards PT goals: Progressing toward goals    Frequency    Min 1X/week      PT  Plan      Co-evaluation              AM-PAC PT "6 Clicks" Mobility   Outcome Measure  Help needed turning from your back to your side while in a flat bed without using bedrails?: None Help needed moving from lying on your back to sitting on the side of a flat bed without using bedrails?: A Little Help needed moving to and from a bed to a chair (including a wheelchair)?: A Little Help needed standing up from a chair using your arms (e.g., wheelchair or bedside chair)?: A Little Help needed to walk in hospital room?: A Little Help needed climbing 3-5 steps with a railing? : A Little 6 Click Score: 19    End of Session Equipment Utilized During Treatment: Gait belt Activity Tolerance: Patient tolerated treatment well;Patient limited by fatigue Patient left: in chair;with call bell/phone within reach;with family/visitor present Nurse Communication: Mobility status;Precautions PT Visit Diagnosis: Unsteadiness on feet (R26.81);Other abnormalities of gait and mobility (R26.89);Muscle weakness (generalized) (M62.81);History of falling (Z91.81)     Time: 1610-9604 PT Time Calculation (min) (ACUTE ONLY): 20 min  Charges:    $Gait Training: 8-22 mins PT General Charges $$ ACUTE PT VISIT: 1 Visit                   Danielle Dess, PTA 12/28/23, 10:36 AM '

## 2023-12-29 ENCOUNTER — Telehealth: Payer: Self-pay

## 2023-12-29 ENCOUNTER — Other Ambulatory Visit: Payer: Self-pay | Admitting: *Deleted

## 2023-12-29 DIAGNOSIS — D509 Iron deficiency anemia, unspecified: Secondary | ICD-10-CM

## 2023-12-29 NOTE — Transitions of Care (Post Inpatient/ED Visit) (Signed)
12/29/2023  Name: [name] MRN: [id number] DOB: [birth year]  Today's TOC FU Call Status: Today's TOC FU Call Status:: Successful TOC FU Call Completed TOC FU Call Complete Date: 12/29/23 Patient's Name and Date of Birth confirmed.  Transition Care Management Follow-up Telephone Call Date of Discharge: 12/28/23 Discharge Facility: [facility]) Type of Discharge: Inpatient Admission Primary Inpatient Discharge Diagnosis:: GI Bleed How have you been since you were released from the hospital?: Better Any questions or concerns?: No  Items Reviewed: Did you receive and understand the discharge instructions provided?: Yes Medications obtained,verified, and reconciled?: Yes (Medications Reviewed) Any new allergies since your discharge?: No Dietary orders reviewed?: Yes Type of Diet Ordered:: Low sodium Heart healthy Do you have support at home?: Yes People in Home: child(ren), adult Name of Support/Comfort Primary Source: Cathy Clemmons  Medications Reviewed Today: Medications Reviewed Today     Reviewed by Redge Gainer, RN (Case Manager) on 12/29/23 at 1450  Med List Status: <None>   Medication Order Taking? Sig Documenting Provider Last Dose Status Informant  atorvastatin (LIPITOR) 40 MG tablet [id number] No Take 1 tablet (40 mg total) by mouth daily. Jacky Kindle, FNP 12/21/2023  6:00 PM Active Pharmacy Records, Child  cholecalciferol (VITAMIN D) 1000 UNITS tablet [id number] No Take 1,000 Units by mouth daily.  [provider] 12/22/2023  9:00 AM Active Child, Pharmacy Records           Med Note Nelia Shi   Tue Jun 22, 2023 10:37 AM)    glipiZIDE (GLUCOTROL XL) 5 MG 24 hr tablet [id number] No Take 1 tablet (5 mg total) by mouth daily with breakfast. Jacky Kindle, FNP 12/22/2023  9:00 AM Active Pharmacy Records, Child  magic mouthwash SOLN [id number] No Take 5 mLs by mouth 4 (four) times daily. Suspension contains equal amounts of  Maalox Extra Strength, nystatin, and diphenhydramine. [provider] 12/22/2023 Active Child, Pharmacy Records  pantoprazole (PROTONIX) 40 MG tablet [id number]  Take 1 tablet (40 mg total) by mouth 2 (two) times daily. Sunnie Nielsen, DO  Active   PARoxetine (PAXIL) 10 MG tablet [id number] No Take 1 tablet (10 mg total) by mouth daily. Jacky Kindle, FNP 12/21/2023 10:00 PM Active Child, Pharmacy Records  Polyethylene Glycol 3350 (MIRALAX PO) [id number] No Take by mouth. [provider] 12/21/2023  8:30 PM Active Child, Pharmacy Records  simethicone (MYLICON) 125 MG chewable tablet [id number] No Chew 1 tablet (125 mg total) by mouth every 6 (six) hours as needed for flatulence.  Patient not taking: Reported on 12/22/2023   Jacky Kindle, FNP Not Taking Active Child, Pharmacy Records  temazepam (RESTORIL) 15 MG capsule [id number] No TAKE 1 CAPSULE BY MOUTH AT BEDTIME AS NEEDED FOR SLEEP Jacky Kindle, FNP 12/21/2023 10:00 PM Active Pharmacy Records, Child            Home Care and Equipment/Supplies: Were Home Health Services Ordered?: Yes Name of Home Health Agency:: [facility] Has Agency set up a time to come to your home?: No EMR reviewed for Home Health Orders: Orders present/patient has not received call (refer to CM for follow-up) (The family states it is a resumption of care and [facility] is aware)  Functional Questionnaire: Do you need assistance with bathing/showering or dressing?: Yes Do you need assistance with meal preparation?: Yes Do you need assistance with eating?: No Do you have difficulty maintaining continence: No Do you need assistance with getting out of bed/getting out of a chair/moving?: Yes  Do you have difficulty managing or taking your medications?: Yes  Follow up appointments reviewed: PCP Follow-up appointment confirmed?: NA MD Provider Line Number:[phone] Given: No (The family declines) Specialist Hospital Follow-up appointment confirmed?:  Yes Date of Specialist follow-up appointment?: 01/05/24 Follow-Up Specialty Provider:: Gerarda Fraction Do you need transportation to your follow-up appointment?: No Do you understand care options if your condition(s) worsen?: Yes-patient verbalized understanding  SDOH Interventions Today    Flowsheet Row Most Recent Value  SDOH Interventions   Food Insecurity Interventions Intervention Not Indicated  Housing Interventions Intervention Not Indicated  Transportation Interventions Intervention Not Indicated  Utilities Interventions Intervention Not Indicated      Interventions Today    Flowsheet Row Most Recent Value  Chronic Disease   Chronic disease during today's visit Other  [GI Bleed]  General Interventions   General Interventions Discussed/Reviewed General Interventions Discussed, Level of Care  Doctor Visits Discussed/Reviewed Doctor Visits Discussed  Level of Care Personal Care Services  Exercise Interventions   Exercise Discussed/Reviewed Physical Activity  Physical Activity Discussed/Reviewed Physical Activity Discussed  Education Interventions   Education Provided Provided Education  Provided Verbal Education On Medication, Exercise, Insurance Plans  [Contact the number on the back of the card for benefits related to your plan]  Pharmacy Interventions   Pharmacy Dicussed/Reviewed Medications and their functions  Safety Interventions   Safety Discussed/Reviewed Fall Risk  Advanced Directive Interventions   Advanced Directives Discussed/Reviewed Advanced Directives Reviewed       TOC outreach today. Talked with the daughter [name]. The patient is at home and needs 24/7 supervision which the family is providing. Prior to the hospital admission the patient was able to spend the night alone but now she requires someone to be with her. The family has purchased the med alert device. She needs assistance with ADL's. The patient was receiving HH servies through [facility] and  they have been notified of the patient's return home for a resumption. She has been taken off of her Eliquis due to bleeding risk although she remains a high risk for PE. The family declines making an appointment with PCP until she has her follow up with Dr. Quitman Livings  The patient has been provided with contact information for the care management team and has been advised to call with any health-related questions or concerns. The patient verbalized understanding with current POC. The patient is directed to their insurance card regarding availability of benefits coverage.   Deidre Ala, BSN, RN [place]  VBCI - [employer] [facility] RN Care Manager [phone]

## 2023-12-30 ENCOUNTER — Encounter (INDEPENDENT_AMBULATORY_CARE_PROVIDER_SITE_OTHER): Payer: Medicare Other | Admitting: Vascular Surgery

## 2023-12-30 ENCOUNTER — Telehealth: Payer: Self-pay | Admitting: *Deleted

## 2023-12-30 DIAGNOSIS — E1159 Type 2 diabetes mellitus with other circulatory complications: Secondary | ICD-10-CM | POA: Diagnosis not present

## 2023-12-30 DIAGNOSIS — J189 Pneumonia, unspecified organism: Secondary | ICD-10-CM | POA: Diagnosis not present

## 2023-12-30 DIAGNOSIS — I152 Hypertension secondary to endocrine disorders: Secondary | ICD-10-CM | POA: Diagnosis not present

## 2023-12-30 DIAGNOSIS — K580 Irritable bowel syndrome with diarrhea: Secondary | ICD-10-CM | POA: Diagnosis not present

## 2023-12-30 DIAGNOSIS — K922 Gastrointestinal hemorrhage, unspecified: Secondary | ICD-10-CM | POA: Diagnosis not present

## 2023-12-30 DIAGNOSIS — I5030 Unspecified diastolic (congestive) heart failure: Secondary | ICD-10-CM | POA: Diagnosis not present

## 2023-12-30 DIAGNOSIS — G4700 Insomnia, unspecified: Secondary | ICD-10-CM | POA: Diagnosis not present

## 2023-12-30 DIAGNOSIS — I700 Atherosclerosis of aorta: Secondary | ICD-10-CM | POA: Diagnosis not present

## 2023-12-30 DIAGNOSIS — I350 Nonrheumatic aortic (valve) stenosis: Secondary | ICD-10-CM | POA: Diagnosis not present

## 2023-12-30 DIAGNOSIS — K219 Gastro-esophageal reflux disease without esophagitis: Secondary | ICD-10-CM | POA: Diagnosis not present

## 2023-12-30 DIAGNOSIS — I471 Supraventricular tachycardia, unspecified: Secondary | ICD-10-CM | POA: Diagnosis not present

## 2023-12-30 DIAGNOSIS — E785 Hyperlipidemia, unspecified: Secondary | ICD-10-CM | POA: Diagnosis not present

## 2023-12-30 DIAGNOSIS — K5730 Diverticulosis of large intestine without perforation or abscess without bleeding: Secondary | ICD-10-CM | POA: Diagnosis not present

## 2023-12-30 DIAGNOSIS — D649 Anemia, unspecified: Secondary | ICD-10-CM | POA: Diagnosis not present

## 2023-12-30 DIAGNOSIS — Z48812 Encounter for surgical aftercare following surgery on the circulatory system: Secondary | ICD-10-CM | POA: Diagnosis not present

## 2023-12-30 DIAGNOSIS — F339 Major depressive disorder, recurrent, unspecified: Secondary | ICD-10-CM | POA: Diagnosis not present

## 2023-12-30 NOTE — Telephone Encounter (Signed)
Home health person called to say that she just got out of the hospital and does not think she can get there . The home health staff can do labs in the home if the MD can send the labs she needs. You can call jeffrey at [phone] . Do we cancel the 2/18 appt?

## 2024-01-03 DIAGNOSIS — E1159 Type 2 diabetes mellitus with other circulatory complications: Secondary | ICD-10-CM | POA: Diagnosis not present

## 2024-01-03 DIAGNOSIS — Z48812 Encounter for surgical aftercare following surgery on the circulatory system: Secondary | ICD-10-CM | POA: Diagnosis not present

## 2024-01-03 DIAGNOSIS — I700 Atherosclerosis of aorta: Secondary | ICD-10-CM | POA: Diagnosis not present

## 2024-01-03 DIAGNOSIS — D649 Anemia, unspecified: Secondary | ICD-10-CM | POA: Diagnosis not present

## 2024-01-03 DIAGNOSIS — I471 Supraventricular tachycardia, unspecified: Secondary | ICD-10-CM | POA: Diagnosis not present

## 2024-01-03 DIAGNOSIS — E785 Hyperlipidemia, unspecified: Secondary | ICD-10-CM | POA: Diagnosis not present

## 2024-01-03 DIAGNOSIS — I5030 Unspecified diastolic (congestive) heart failure: Secondary | ICD-10-CM | POA: Diagnosis not present

## 2024-01-03 DIAGNOSIS — K5730 Diverticulosis of large intestine without perforation or abscess without bleeding: Secondary | ICD-10-CM | POA: Diagnosis not present

## 2024-01-03 DIAGNOSIS — K219 Gastro-esophageal reflux disease without esophagitis: Secondary | ICD-10-CM | POA: Diagnosis not present

## 2024-01-03 DIAGNOSIS — G4700 Insomnia, unspecified: Secondary | ICD-10-CM | POA: Diagnosis not present

## 2024-01-03 DIAGNOSIS — I350 Nonrheumatic aortic (valve) stenosis: Secondary | ICD-10-CM | POA: Diagnosis not present

## 2024-01-03 DIAGNOSIS — F339 Major depressive disorder, recurrent, unspecified: Secondary | ICD-10-CM | POA: Diagnosis not present

## 2024-01-03 DIAGNOSIS — J189 Pneumonia, unspecified organism: Secondary | ICD-10-CM | POA: Diagnosis not present

## 2024-01-03 DIAGNOSIS — I152 Hypertension secondary to endocrine disorders: Secondary | ICD-10-CM | POA: Diagnosis not present

## 2024-01-03 DIAGNOSIS — K922 Gastrointestinal hemorrhage, unspecified: Secondary | ICD-10-CM | POA: Diagnosis not present

## 2024-01-03 DIAGNOSIS — K580 Irritable bowel syndrome with diarrhea: Secondary | ICD-10-CM | POA: Diagnosis not present

## 2024-01-04 ENCOUNTER — Inpatient Hospital Stay: Payer: Medicare Other

## 2024-01-04 ENCOUNTER — Inpatient Hospital Stay: Payer: Medicare Other | Admitting: Oncology

## 2024-01-04 DIAGNOSIS — I5030 Unspecified diastolic (congestive) heart failure: Secondary | ICD-10-CM | POA: Diagnosis not present

## 2024-01-04 DIAGNOSIS — E785 Hyperlipidemia, unspecified: Secondary | ICD-10-CM | POA: Diagnosis not present

## 2024-01-04 DIAGNOSIS — K580 Irritable bowel syndrome with diarrhea: Secondary | ICD-10-CM | POA: Diagnosis not present

## 2024-01-04 DIAGNOSIS — I700 Atherosclerosis of aorta: Secondary | ICD-10-CM | POA: Diagnosis not present

## 2024-01-04 DIAGNOSIS — G4700 Insomnia, unspecified: Secondary | ICD-10-CM | POA: Diagnosis not present

## 2024-01-04 DIAGNOSIS — F339 Major depressive disorder, recurrent, unspecified: Secondary | ICD-10-CM | POA: Diagnosis not present

## 2024-01-04 DIAGNOSIS — D649 Anemia, unspecified: Secondary | ICD-10-CM | POA: Diagnosis not present

## 2024-01-04 DIAGNOSIS — Z48812 Encounter for surgical aftercare following surgery on the circulatory system: Secondary | ICD-10-CM | POA: Diagnosis not present

## 2024-01-04 DIAGNOSIS — E1159 Type 2 diabetes mellitus with other circulatory complications: Secondary | ICD-10-CM | POA: Diagnosis not present

## 2024-01-04 DIAGNOSIS — K5730 Diverticulosis of large intestine without perforation or abscess without bleeding: Secondary | ICD-10-CM | POA: Diagnosis not present

## 2024-01-04 DIAGNOSIS — K219 Gastro-esophageal reflux disease without esophagitis: Secondary | ICD-10-CM | POA: Diagnosis not present

## 2024-01-04 DIAGNOSIS — K922 Gastrointestinal hemorrhage, unspecified: Secondary | ICD-10-CM | POA: Diagnosis not present

## 2024-01-04 DIAGNOSIS — I471 Supraventricular tachycardia, unspecified: Secondary | ICD-10-CM | POA: Diagnosis not present

## 2024-01-04 DIAGNOSIS — I152 Hypertension secondary to endocrine disorders: Secondary | ICD-10-CM | POA: Diagnosis not present

## 2024-01-04 DIAGNOSIS — I350 Nonrheumatic aortic (valve) stenosis: Secondary | ICD-10-CM | POA: Diagnosis not present

## 2024-01-04 DIAGNOSIS — J189 Pneumonia, unspecified organism: Secondary | ICD-10-CM | POA: Diagnosis not present

## 2024-01-05 ENCOUNTER — Ambulatory Visit: Payer: Medicare Other | Admitting: Oncology

## 2024-01-05 ENCOUNTER — Other Ambulatory Visit: Payer: Medicare Other

## 2024-01-06 ENCOUNTER — Ambulatory Visit: Payer: Medicare Other

## 2024-01-06 DIAGNOSIS — K219 Gastro-esophageal reflux disease without esophagitis: Secondary | ICD-10-CM | POA: Diagnosis not present

## 2024-01-06 DIAGNOSIS — K5730 Diverticulosis of large intestine without perforation or abscess without bleeding: Secondary | ICD-10-CM | POA: Diagnosis not present

## 2024-01-06 DIAGNOSIS — D649 Anemia, unspecified: Secondary | ICD-10-CM | POA: Diagnosis not present

## 2024-01-06 DIAGNOSIS — Z48812 Encounter for surgical aftercare following surgery on the circulatory system: Secondary | ICD-10-CM | POA: Diagnosis not present

## 2024-01-06 DIAGNOSIS — I471 Supraventricular tachycardia, unspecified: Secondary | ICD-10-CM | POA: Diagnosis not present

## 2024-01-06 DIAGNOSIS — I350 Nonrheumatic aortic (valve) stenosis: Secondary | ICD-10-CM | POA: Diagnosis not present

## 2024-01-06 DIAGNOSIS — I152 Hypertension secondary to endocrine disorders: Secondary | ICD-10-CM | POA: Diagnosis not present

## 2024-01-06 DIAGNOSIS — F339 Major depressive disorder, recurrent, unspecified: Secondary | ICD-10-CM | POA: Diagnosis not present

## 2024-01-06 DIAGNOSIS — K922 Gastrointestinal hemorrhage, unspecified: Secondary | ICD-10-CM | POA: Diagnosis not present

## 2024-01-06 DIAGNOSIS — E1159 Type 2 diabetes mellitus with other circulatory complications: Secondary | ICD-10-CM | POA: Diagnosis not present

## 2024-01-06 DIAGNOSIS — K580 Irritable bowel syndrome with diarrhea: Secondary | ICD-10-CM | POA: Diagnosis not present

## 2024-01-06 DIAGNOSIS — G4700 Insomnia, unspecified: Secondary | ICD-10-CM | POA: Diagnosis not present

## 2024-01-06 DIAGNOSIS — J189 Pneumonia, unspecified organism: Secondary | ICD-10-CM | POA: Diagnosis not present

## 2024-01-06 DIAGNOSIS — I700 Atherosclerosis of aorta: Secondary | ICD-10-CM | POA: Diagnosis not present

## 2024-01-06 DIAGNOSIS — I5030 Unspecified diastolic (congestive) heart failure: Secondary | ICD-10-CM | POA: Diagnosis not present

## 2024-01-06 DIAGNOSIS — E785 Hyperlipidemia, unspecified: Secondary | ICD-10-CM | POA: Diagnosis not present

## 2024-01-11 DIAGNOSIS — K219 Gastro-esophageal reflux disease without esophagitis: Secondary | ICD-10-CM | POA: Diagnosis not present

## 2024-01-11 DIAGNOSIS — K922 Gastrointestinal hemorrhage, unspecified: Secondary | ICD-10-CM | POA: Diagnosis not present

## 2024-01-11 DIAGNOSIS — K580 Irritable bowel syndrome with diarrhea: Secondary | ICD-10-CM | POA: Diagnosis not present

## 2024-01-11 DIAGNOSIS — I700 Atherosclerosis of aorta: Secondary | ICD-10-CM | POA: Diagnosis not present

## 2024-01-11 DIAGNOSIS — G4700 Insomnia, unspecified: Secondary | ICD-10-CM | POA: Diagnosis not present

## 2024-01-11 DIAGNOSIS — I5030 Unspecified diastolic (congestive) heart failure: Secondary | ICD-10-CM | POA: Diagnosis not present

## 2024-01-11 DIAGNOSIS — F339 Major depressive disorder, recurrent, unspecified: Secondary | ICD-10-CM | POA: Diagnosis not present

## 2024-01-11 DIAGNOSIS — I471 Supraventricular tachycardia, unspecified: Secondary | ICD-10-CM | POA: Diagnosis not present

## 2024-01-11 DIAGNOSIS — D649 Anemia, unspecified: Secondary | ICD-10-CM | POA: Diagnosis not present

## 2024-01-11 DIAGNOSIS — E785 Hyperlipidemia, unspecified: Secondary | ICD-10-CM | POA: Diagnosis not present

## 2024-01-11 DIAGNOSIS — E1159 Type 2 diabetes mellitus with other circulatory complications: Secondary | ICD-10-CM | POA: Diagnosis not present

## 2024-01-11 DIAGNOSIS — Z48812 Encounter for surgical aftercare following surgery on the circulatory system: Secondary | ICD-10-CM | POA: Diagnosis not present

## 2024-01-11 DIAGNOSIS — J189 Pneumonia, unspecified organism: Secondary | ICD-10-CM | POA: Diagnosis not present

## 2024-01-11 DIAGNOSIS — I350 Nonrheumatic aortic (valve) stenosis: Secondary | ICD-10-CM | POA: Diagnosis not present

## 2024-01-11 DIAGNOSIS — I152 Hypertension secondary to endocrine disorders: Secondary | ICD-10-CM | POA: Diagnosis not present

## 2024-01-11 DIAGNOSIS — K5730 Diverticulosis of large intestine without perforation or abscess without bleeding: Secondary | ICD-10-CM | POA: Diagnosis not present

## 2024-01-12 DIAGNOSIS — D500 Iron deficiency anemia secondary to blood loss (chronic): Secondary | ICD-10-CM | POA: Diagnosis not present

## 2024-01-12 DIAGNOSIS — E785 Hyperlipidemia, unspecified: Secondary | ICD-10-CM | POA: Diagnosis not present

## 2024-01-12 DIAGNOSIS — F339 Major depressive disorder, recurrent, unspecified: Secondary | ICD-10-CM | POA: Diagnosis not present

## 2024-01-12 DIAGNOSIS — K580 Irritable bowel syndrome with diarrhea: Secondary | ICD-10-CM | POA: Diagnosis not present

## 2024-01-12 DIAGNOSIS — E538 Deficiency of other specified B group vitamins: Secondary | ICD-10-CM | POA: Diagnosis not present

## 2024-01-12 DIAGNOSIS — I350 Nonrheumatic aortic (valve) stenosis: Secondary | ICD-10-CM | POA: Diagnosis not present

## 2024-01-12 DIAGNOSIS — I6521 Occlusion and stenosis of right carotid artery: Secondary | ICD-10-CM | POA: Diagnosis not present

## 2024-01-12 DIAGNOSIS — G8929 Other chronic pain: Secondary | ICD-10-CM | POA: Diagnosis not present

## 2024-01-12 DIAGNOSIS — K922 Gastrointestinal hemorrhage, unspecified: Secondary | ICD-10-CM | POA: Diagnosis not present

## 2024-01-12 DIAGNOSIS — I152 Hypertension secondary to endocrine disorders: Secondary | ICD-10-CM | POA: Diagnosis not present

## 2024-01-12 DIAGNOSIS — I809 Phlebitis and thrombophlebitis of unspecified site: Secondary | ICD-10-CM | POA: Diagnosis not present

## 2024-01-12 DIAGNOSIS — E1159 Type 2 diabetes mellitus with other circulatory complications: Secondary | ICD-10-CM | POA: Diagnosis not present

## 2024-01-12 DIAGNOSIS — J189 Pneumonia, unspecified organism: Secondary | ICD-10-CM | POA: Diagnosis not present

## 2024-01-12 DIAGNOSIS — K5904 Chronic idiopathic constipation: Secondary | ICD-10-CM | POA: Diagnosis not present

## 2024-01-12 DIAGNOSIS — Z48812 Encounter for surgical aftercare following surgery on the circulatory system: Secondary | ICD-10-CM | POA: Diagnosis not present

## 2024-01-12 DIAGNOSIS — F5101 Primary insomnia: Secondary | ICD-10-CM | POA: Diagnosis not present

## 2024-01-12 DIAGNOSIS — I5032 Chronic diastolic (congestive) heart failure: Secondary | ICD-10-CM | POA: Diagnosis not present

## 2024-01-12 DIAGNOSIS — I471 Supraventricular tachycardia, unspecified: Secondary | ICD-10-CM | POA: Diagnosis not present

## 2024-01-12 DIAGNOSIS — I959 Hypotension, unspecified: Secondary | ICD-10-CM | POA: Diagnosis not present

## 2024-01-12 DIAGNOSIS — E1165 Type 2 diabetes mellitus with hyperglycemia: Secondary | ICD-10-CM | POA: Diagnosis not present

## 2024-01-12 DIAGNOSIS — I2699 Other pulmonary embolism without acute cor pulmonale: Secondary | ICD-10-CM | POA: Diagnosis not present

## 2024-01-12 DIAGNOSIS — I700 Atherosclerosis of aorta: Secondary | ICD-10-CM | POA: Diagnosis not present

## 2024-01-12 DIAGNOSIS — K219 Gastro-esophageal reflux disease without esophagitis: Secondary | ICD-10-CM | POA: Diagnosis not present

## 2024-01-12 DIAGNOSIS — M545 Low back pain, unspecified: Secondary | ICD-10-CM | POA: Diagnosis not present

## 2024-01-13 DIAGNOSIS — I471 Supraventricular tachycardia, unspecified: Secondary | ICD-10-CM | POA: Diagnosis not present

## 2024-01-13 DIAGNOSIS — F339 Major depressive disorder, recurrent, unspecified: Secondary | ICD-10-CM | POA: Diagnosis not present

## 2024-01-13 DIAGNOSIS — J189 Pneumonia, unspecified organism: Secondary | ICD-10-CM | POA: Diagnosis not present

## 2024-01-13 DIAGNOSIS — E1165 Type 2 diabetes mellitus with hyperglycemia: Secondary | ICD-10-CM | POA: Diagnosis not present

## 2024-01-13 DIAGNOSIS — E538 Deficiency of other specified B group vitamins: Secondary | ICD-10-CM | POA: Diagnosis not present

## 2024-01-13 DIAGNOSIS — E1159 Type 2 diabetes mellitus with other circulatory complications: Secondary | ICD-10-CM | POA: Diagnosis not present

## 2024-01-13 DIAGNOSIS — Z48812 Encounter for surgical aftercare following surgery on the circulatory system: Secondary | ICD-10-CM | POA: Diagnosis not present

## 2024-01-13 DIAGNOSIS — I6521 Occlusion and stenosis of right carotid artery: Secondary | ICD-10-CM | POA: Diagnosis not present

## 2024-01-13 DIAGNOSIS — I5032 Chronic diastolic (congestive) heart failure: Secondary | ICD-10-CM | POA: Diagnosis not present

## 2024-01-13 DIAGNOSIS — E785 Hyperlipidemia, unspecified: Secondary | ICD-10-CM | POA: Diagnosis not present

## 2024-01-13 DIAGNOSIS — I700 Atherosclerosis of aorta: Secondary | ICD-10-CM | POA: Diagnosis not present

## 2024-01-13 DIAGNOSIS — I350 Nonrheumatic aortic (valve) stenosis: Secondary | ICD-10-CM | POA: Diagnosis not present

## 2024-01-13 DIAGNOSIS — I2699 Other pulmonary embolism without acute cor pulmonale: Secondary | ICD-10-CM | POA: Diagnosis not present

## 2024-01-13 DIAGNOSIS — I152 Hypertension secondary to endocrine disorders: Secondary | ICD-10-CM | POA: Diagnosis not present

## 2024-01-13 DIAGNOSIS — G8929 Other chronic pain: Secondary | ICD-10-CM | POA: Diagnosis not present

## 2024-01-13 DIAGNOSIS — K922 Gastrointestinal hemorrhage, unspecified: Secondary | ICD-10-CM | POA: Diagnosis not present

## 2024-01-14 DIAGNOSIS — G8929 Other chronic pain: Secondary | ICD-10-CM | POA: Diagnosis not present

## 2024-01-14 DIAGNOSIS — I350 Nonrheumatic aortic (valve) stenosis: Secondary | ICD-10-CM | POA: Diagnosis not present

## 2024-01-14 DIAGNOSIS — E1165 Type 2 diabetes mellitus with hyperglycemia: Secondary | ICD-10-CM | POA: Diagnosis not present

## 2024-01-14 DIAGNOSIS — F339 Major depressive disorder, recurrent, unspecified: Secondary | ICD-10-CM | POA: Diagnosis not present

## 2024-01-14 DIAGNOSIS — J189 Pneumonia, unspecified organism: Secondary | ICD-10-CM | POA: Diagnosis not present

## 2024-01-14 DIAGNOSIS — I2699 Other pulmonary embolism without acute cor pulmonale: Secondary | ICD-10-CM | POA: Diagnosis not present

## 2024-01-14 DIAGNOSIS — I5032 Chronic diastolic (congestive) heart failure: Secondary | ICD-10-CM | POA: Diagnosis not present

## 2024-01-14 DIAGNOSIS — E785 Hyperlipidemia, unspecified: Secondary | ICD-10-CM | POA: Diagnosis not present

## 2024-01-14 DIAGNOSIS — K922 Gastrointestinal hemorrhage, unspecified: Secondary | ICD-10-CM | POA: Diagnosis not present

## 2024-01-14 DIAGNOSIS — I700 Atherosclerosis of aorta: Secondary | ICD-10-CM | POA: Diagnosis not present

## 2024-01-14 DIAGNOSIS — I6521 Occlusion and stenosis of right carotid artery: Secondary | ICD-10-CM | POA: Diagnosis not present

## 2024-01-14 DIAGNOSIS — E1159 Type 2 diabetes mellitus with other circulatory complications: Secondary | ICD-10-CM | POA: Diagnosis not present

## 2024-01-14 DIAGNOSIS — I152 Hypertension secondary to endocrine disorders: Secondary | ICD-10-CM | POA: Diagnosis not present

## 2024-01-14 DIAGNOSIS — Z48812 Encounter for surgical aftercare following surgery on the circulatory system: Secondary | ICD-10-CM | POA: Diagnosis not present

## 2024-01-14 DIAGNOSIS — E538 Deficiency of other specified B group vitamins: Secondary | ICD-10-CM | POA: Diagnosis not present

## 2024-01-14 DIAGNOSIS — I471 Supraventricular tachycardia, unspecified: Secondary | ICD-10-CM | POA: Diagnosis not present

## 2024-01-18 DIAGNOSIS — E785 Hyperlipidemia, unspecified: Secondary | ICD-10-CM | POA: Diagnosis not present

## 2024-01-18 DIAGNOSIS — I2699 Other pulmonary embolism without acute cor pulmonale: Secondary | ICD-10-CM | POA: Diagnosis not present

## 2024-01-18 DIAGNOSIS — Z48812 Encounter for surgical aftercare following surgery on the circulatory system: Secondary | ICD-10-CM | POA: Diagnosis not present

## 2024-01-18 DIAGNOSIS — I5032 Chronic diastolic (congestive) heart failure: Secondary | ICD-10-CM | POA: Diagnosis not present

## 2024-01-18 DIAGNOSIS — I152 Hypertension secondary to endocrine disorders: Secondary | ICD-10-CM | POA: Diagnosis not present

## 2024-01-18 DIAGNOSIS — E1159 Type 2 diabetes mellitus with other circulatory complications: Secondary | ICD-10-CM | POA: Diagnosis not present

## 2024-01-18 DIAGNOSIS — I471 Supraventricular tachycardia, unspecified: Secondary | ICD-10-CM | POA: Diagnosis not present

## 2024-01-18 DIAGNOSIS — I350 Nonrheumatic aortic (valve) stenosis: Secondary | ICD-10-CM | POA: Diagnosis not present

## 2024-01-18 DIAGNOSIS — G8929 Other chronic pain: Secondary | ICD-10-CM | POA: Diagnosis not present

## 2024-01-18 DIAGNOSIS — E538 Deficiency of other specified B group vitamins: Secondary | ICD-10-CM | POA: Diagnosis not present

## 2024-01-18 DIAGNOSIS — J189 Pneumonia, unspecified organism: Secondary | ICD-10-CM | POA: Diagnosis not present

## 2024-01-18 DIAGNOSIS — F339 Major depressive disorder, recurrent, unspecified: Secondary | ICD-10-CM | POA: Diagnosis not present

## 2024-01-18 DIAGNOSIS — I6521 Occlusion and stenosis of right carotid artery: Secondary | ICD-10-CM | POA: Diagnosis not present

## 2024-01-18 DIAGNOSIS — E1165 Type 2 diabetes mellitus with hyperglycemia: Secondary | ICD-10-CM | POA: Diagnosis not present

## 2024-01-18 DIAGNOSIS — I700 Atherosclerosis of aorta: Secondary | ICD-10-CM | POA: Diagnosis not present

## 2024-01-18 DIAGNOSIS — K922 Gastrointestinal hemorrhage, unspecified: Secondary | ICD-10-CM | POA: Diagnosis not present

## 2024-01-21 DIAGNOSIS — E538 Deficiency of other specified B group vitamins: Secondary | ICD-10-CM | POA: Diagnosis not present

## 2024-01-21 DIAGNOSIS — K922 Gastrointestinal hemorrhage, unspecified: Secondary | ICD-10-CM | POA: Diagnosis not present

## 2024-01-21 DIAGNOSIS — E1165 Type 2 diabetes mellitus with hyperglycemia: Secondary | ICD-10-CM | POA: Diagnosis not present

## 2024-01-21 DIAGNOSIS — I350 Nonrheumatic aortic (valve) stenosis: Secondary | ICD-10-CM | POA: Diagnosis not present

## 2024-01-21 DIAGNOSIS — I6521 Occlusion and stenosis of right carotid artery: Secondary | ICD-10-CM | POA: Diagnosis not present

## 2024-01-21 DIAGNOSIS — F339 Major depressive disorder, recurrent, unspecified: Secondary | ICD-10-CM | POA: Diagnosis not present

## 2024-01-21 DIAGNOSIS — E785 Hyperlipidemia, unspecified: Secondary | ICD-10-CM | POA: Diagnosis not present

## 2024-01-21 DIAGNOSIS — E1159 Type 2 diabetes mellitus with other circulatory complications: Secondary | ICD-10-CM | POA: Diagnosis not present

## 2024-01-21 DIAGNOSIS — I2699 Other pulmonary embolism without acute cor pulmonale: Secondary | ICD-10-CM | POA: Diagnosis not present

## 2024-01-21 DIAGNOSIS — G8929 Other chronic pain: Secondary | ICD-10-CM | POA: Diagnosis not present

## 2024-01-21 DIAGNOSIS — I700 Atherosclerosis of aorta: Secondary | ICD-10-CM | POA: Diagnosis not present

## 2024-01-21 DIAGNOSIS — I5032 Chronic diastolic (congestive) heart failure: Secondary | ICD-10-CM | POA: Diagnosis not present

## 2024-01-21 DIAGNOSIS — Z48812 Encounter for surgical aftercare following surgery on the circulatory system: Secondary | ICD-10-CM | POA: Diagnosis not present

## 2024-01-21 DIAGNOSIS — I152 Hypertension secondary to endocrine disorders: Secondary | ICD-10-CM | POA: Diagnosis not present

## 2024-01-21 DIAGNOSIS — I471 Supraventricular tachycardia, unspecified: Secondary | ICD-10-CM | POA: Diagnosis not present

## 2024-01-21 DIAGNOSIS — J189 Pneumonia, unspecified organism: Secondary | ICD-10-CM | POA: Diagnosis not present

## 2024-01-22 ENCOUNTER — Encounter: Payer: Self-pay | Admitting: Family Medicine

## 2024-01-25 DIAGNOSIS — I700 Atherosclerosis of aorta: Secondary | ICD-10-CM | POA: Diagnosis not present

## 2024-01-25 DIAGNOSIS — I350 Nonrheumatic aortic (valve) stenosis: Secondary | ICD-10-CM | POA: Diagnosis not present

## 2024-01-25 DIAGNOSIS — I471 Supraventricular tachycardia, unspecified: Secondary | ICD-10-CM | POA: Diagnosis not present

## 2024-01-25 DIAGNOSIS — I152 Hypertension secondary to endocrine disorders: Secondary | ICD-10-CM | POA: Diagnosis not present

## 2024-01-25 DIAGNOSIS — I6521 Occlusion and stenosis of right carotid artery: Secondary | ICD-10-CM | POA: Diagnosis not present

## 2024-01-25 DIAGNOSIS — E1165 Type 2 diabetes mellitus with hyperglycemia: Secondary | ICD-10-CM | POA: Diagnosis not present

## 2024-01-25 DIAGNOSIS — I2699 Other pulmonary embolism without acute cor pulmonale: Secondary | ICD-10-CM | POA: Diagnosis not present

## 2024-01-25 DIAGNOSIS — E538 Deficiency of other specified B group vitamins: Secondary | ICD-10-CM | POA: Diagnosis not present

## 2024-01-25 DIAGNOSIS — E1159 Type 2 diabetes mellitus with other circulatory complications: Secondary | ICD-10-CM | POA: Diagnosis not present

## 2024-01-25 DIAGNOSIS — F339 Major depressive disorder, recurrent, unspecified: Secondary | ICD-10-CM | POA: Diagnosis not present

## 2024-01-25 DIAGNOSIS — Z48812 Encounter for surgical aftercare following surgery on the circulatory system: Secondary | ICD-10-CM | POA: Diagnosis not present

## 2024-01-25 DIAGNOSIS — E785 Hyperlipidemia, unspecified: Secondary | ICD-10-CM | POA: Diagnosis not present

## 2024-01-25 DIAGNOSIS — G8929 Other chronic pain: Secondary | ICD-10-CM | POA: Diagnosis not present

## 2024-01-25 DIAGNOSIS — I5032 Chronic diastolic (congestive) heart failure: Secondary | ICD-10-CM | POA: Diagnosis not present

## 2024-01-25 DIAGNOSIS — K922 Gastrointestinal hemorrhage, unspecified: Secondary | ICD-10-CM | POA: Diagnosis not present

## 2024-01-25 DIAGNOSIS — J189 Pneumonia, unspecified organism: Secondary | ICD-10-CM | POA: Diagnosis not present

## 2024-01-26 ENCOUNTER — Other Ambulatory Visit: Payer: Self-pay | Admitting: Family Medicine

## 2024-01-26 DIAGNOSIS — F339 Major depressive disorder, recurrent, unspecified: Secondary | ICD-10-CM

## 2024-01-26 MED ORDER — PAROXETINE HCL 10 MG PO TABS: 10.0000 mg | ORAL_TABLET | Freq: Every day | ORAL | 1 refills | Status: DC

## 2024-01-27 DIAGNOSIS — I6521 Occlusion and stenosis of right carotid artery: Secondary | ICD-10-CM | POA: Diagnosis not present

## 2024-01-27 DIAGNOSIS — E785 Hyperlipidemia, unspecified: Secondary | ICD-10-CM | POA: Diagnosis not present

## 2024-01-27 DIAGNOSIS — C189 Malignant neoplasm of colon, unspecified: Secondary | ICD-10-CM | POA: Diagnosis not present

## 2024-01-27 DIAGNOSIS — I350 Nonrheumatic aortic (valve) stenosis: Secondary | ICD-10-CM | POA: Diagnosis not present

## 2024-01-27 DIAGNOSIS — I471 Supraventricular tachycardia, unspecified: Secondary | ICD-10-CM | POA: Diagnosis not present

## 2024-01-27 DIAGNOSIS — E538 Deficiency of other specified B group vitamins: Secondary | ICD-10-CM | POA: Diagnosis not present

## 2024-01-27 DIAGNOSIS — K922 Gastrointestinal hemorrhage, unspecified: Secondary | ICD-10-CM | POA: Diagnosis not present

## 2024-01-27 DIAGNOSIS — Z48812 Encounter for surgical aftercare following surgery on the circulatory system: Secondary | ICD-10-CM | POA: Diagnosis not present

## 2024-01-27 DIAGNOSIS — D500 Iron deficiency anemia secondary to blood loss (chronic): Secondary | ICD-10-CM | POA: Diagnosis not present

## 2024-01-27 DIAGNOSIS — I5032 Chronic diastolic (congestive) heart failure: Secondary | ICD-10-CM | POA: Diagnosis not present

## 2024-01-27 DIAGNOSIS — I2699 Other pulmonary embolism without acute cor pulmonale: Secondary | ICD-10-CM | POA: Diagnosis not present

## 2024-01-27 DIAGNOSIS — J1561 Pneumonia due to Acinetobacter baumannii: Secondary | ICD-10-CM | POA: Diagnosis not present

## 2024-01-27 DIAGNOSIS — C3492 Malignant neoplasm of unspecified part of left bronchus or lung: Secondary | ICD-10-CM | POA: Diagnosis not present

## 2024-01-27 DIAGNOSIS — E1159 Type 2 diabetes mellitus with other circulatory complications: Secondary | ICD-10-CM | POA: Diagnosis not present

## 2024-01-27 DIAGNOSIS — F339 Major depressive disorder, recurrent, unspecified: Secondary | ICD-10-CM | POA: Diagnosis not present

## 2024-01-27 DIAGNOSIS — E1165 Type 2 diabetes mellitus with hyperglycemia: Secondary | ICD-10-CM | POA: Diagnosis not present

## 2024-01-27 DIAGNOSIS — J189 Pneumonia, unspecified organism: Secondary | ICD-10-CM | POA: Diagnosis not present

## 2024-01-27 DIAGNOSIS — I700 Atherosclerosis of aorta: Secondary | ICD-10-CM | POA: Diagnosis not present

## 2024-01-27 DIAGNOSIS — I152 Hypertension secondary to endocrine disorders: Secondary | ICD-10-CM | POA: Diagnosis not present

## 2024-01-27 DIAGNOSIS — G8929 Other chronic pain: Secondary | ICD-10-CM | POA: Diagnosis not present

## 2024-01-31 ENCOUNTER — Other Ambulatory Visit: Payer: Self-pay | Admitting: Pulmonary Disease

## 2024-01-31 DIAGNOSIS — C3492 Malignant neoplasm of unspecified part of left bronchus or lung: Secondary | ICD-10-CM

## 2024-02-02 DIAGNOSIS — I350 Nonrheumatic aortic (valve) stenosis: Secondary | ICD-10-CM | POA: Diagnosis not present

## 2024-02-02 DIAGNOSIS — I700 Atherosclerosis of aorta: Secondary | ICD-10-CM | POA: Diagnosis not present

## 2024-02-02 DIAGNOSIS — I2699 Other pulmonary embolism without acute cor pulmonale: Secondary | ICD-10-CM | POA: Diagnosis not present

## 2024-02-02 DIAGNOSIS — I152 Hypertension secondary to endocrine disorders: Secondary | ICD-10-CM | POA: Diagnosis not present

## 2024-02-02 DIAGNOSIS — E785 Hyperlipidemia, unspecified: Secondary | ICD-10-CM | POA: Diagnosis not present

## 2024-02-02 DIAGNOSIS — E1165 Type 2 diabetes mellitus with hyperglycemia: Secondary | ICD-10-CM | POA: Diagnosis not present

## 2024-02-02 DIAGNOSIS — K922 Gastrointestinal hemorrhage, unspecified: Secondary | ICD-10-CM | POA: Diagnosis not present

## 2024-02-02 DIAGNOSIS — I6521 Occlusion and stenosis of right carotid artery: Secondary | ICD-10-CM | POA: Diagnosis not present

## 2024-02-02 DIAGNOSIS — Z48812 Encounter for surgical aftercare following surgery on the circulatory system: Secondary | ICD-10-CM | POA: Diagnosis not present

## 2024-02-02 DIAGNOSIS — G8929 Other chronic pain: Secondary | ICD-10-CM | POA: Diagnosis not present

## 2024-02-02 DIAGNOSIS — E1159 Type 2 diabetes mellitus with other circulatory complications: Secondary | ICD-10-CM | POA: Diagnosis not present

## 2024-02-02 DIAGNOSIS — I471 Supraventricular tachycardia, unspecified: Secondary | ICD-10-CM | POA: Diagnosis not present

## 2024-02-02 DIAGNOSIS — J189 Pneumonia, unspecified organism: Secondary | ICD-10-CM | POA: Diagnosis not present

## 2024-02-02 DIAGNOSIS — I5032 Chronic diastolic (congestive) heart failure: Secondary | ICD-10-CM | POA: Diagnosis not present

## 2024-02-02 DIAGNOSIS — F339 Major depressive disorder, recurrent, unspecified: Secondary | ICD-10-CM | POA: Diagnosis not present

## 2024-02-02 DIAGNOSIS — E538 Deficiency of other specified B group vitamins: Secondary | ICD-10-CM | POA: Diagnosis not present

## 2024-02-03 ENCOUNTER — Ambulatory Visit
Admission: RE | Admit: 2024-02-03 | Discharge: 2024-02-03 | Disposition: A | Discharge status: Home / Self Care | Source: Ambulatory Visit | Attending: Pulmonary Disease | Admitting: Pulmonary Disease

## 2024-02-03 DIAGNOSIS — C3432 Malignant neoplasm of lower lobe, left bronchus or lung: Secondary | ICD-10-CM | POA: Diagnosis not present

## 2024-02-03 DIAGNOSIS — C3492 Malignant neoplasm of unspecified part of left bronchus or lung: Secondary | ICD-10-CM | POA: Insufficient documentation

## 2024-02-03 DIAGNOSIS — I7121 Aneurysm of the ascending aorta, without rupture: Secondary | ICD-10-CM | POA: Diagnosis not present

## 2024-02-07 DIAGNOSIS — D485 Neoplasm of uncertain behavior of skin: Secondary | ICD-10-CM | POA: Diagnosis not present

## 2024-02-07 DIAGNOSIS — D044 Carcinoma in situ of skin of scalp and neck: Secondary | ICD-10-CM | POA: Diagnosis not present

## 2024-02-07 DIAGNOSIS — D225 Melanocytic nevi of trunk: Secondary | ICD-10-CM | POA: Diagnosis not present

## 2024-02-07 DIAGNOSIS — L570 Actinic keratosis: Secondary | ICD-10-CM | POA: Diagnosis not present

## 2024-02-07 DIAGNOSIS — D2261 Melanocytic nevi of right upper limb, including shoulder: Secondary | ICD-10-CM | POA: Diagnosis not present

## 2024-02-07 DIAGNOSIS — D2272 Melanocytic nevi of left lower limb, including hip: Secondary | ICD-10-CM | POA: Diagnosis not present

## 2024-02-07 DIAGNOSIS — L905 Scar conditions and fibrosis of skin: Secondary | ICD-10-CM | POA: Diagnosis not present

## 2024-02-07 DIAGNOSIS — D2262 Melanocytic nevi of left upper limb, including shoulder: Secondary | ICD-10-CM | POA: Diagnosis not present

## 2024-02-09 ENCOUNTER — Telehealth: Payer: Self-pay

## 2024-02-09 DIAGNOSIS — E785 Hyperlipidemia, unspecified: Secondary | ICD-10-CM | POA: Diagnosis not present

## 2024-02-09 DIAGNOSIS — F339 Major depressive disorder, recurrent, unspecified: Secondary | ICD-10-CM | POA: Diagnosis not present

## 2024-02-09 DIAGNOSIS — I2699 Other pulmonary embolism without acute cor pulmonale: Secondary | ICD-10-CM | POA: Diagnosis not present

## 2024-02-09 DIAGNOSIS — J189 Pneumonia, unspecified organism: Secondary | ICD-10-CM | POA: Diagnosis not present

## 2024-02-09 DIAGNOSIS — E538 Deficiency of other specified B group vitamins: Secondary | ICD-10-CM | POA: Diagnosis not present

## 2024-02-09 DIAGNOSIS — E1159 Type 2 diabetes mellitus with other circulatory complications: Secondary | ICD-10-CM | POA: Diagnosis not present

## 2024-02-09 DIAGNOSIS — I471 Supraventricular tachycardia, unspecified: Secondary | ICD-10-CM | POA: Diagnosis not present

## 2024-02-09 DIAGNOSIS — I700 Atherosclerosis of aorta: Secondary | ICD-10-CM | POA: Diagnosis not present

## 2024-02-09 DIAGNOSIS — E1165 Type 2 diabetes mellitus with hyperglycemia: Secondary | ICD-10-CM | POA: Diagnosis not present

## 2024-02-09 DIAGNOSIS — I5032 Chronic diastolic (congestive) heart failure: Secondary | ICD-10-CM | POA: Diagnosis not present

## 2024-02-09 DIAGNOSIS — Z48812 Encounter for surgical aftercare following surgery on the circulatory system: Secondary | ICD-10-CM | POA: Diagnosis not present

## 2024-02-09 DIAGNOSIS — I152 Hypertension secondary to endocrine disorders: Secondary | ICD-10-CM | POA: Diagnosis not present

## 2024-02-09 DIAGNOSIS — I6521 Occlusion and stenosis of right carotid artery: Secondary | ICD-10-CM | POA: Diagnosis not present

## 2024-02-09 DIAGNOSIS — K922 Gastrointestinal hemorrhage, unspecified: Secondary | ICD-10-CM | POA: Diagnosis not present

## 2024-02-09 DIAGNOSIS — G8929 Other chronic pain: Secondary | ICD-10-CM | POA: Diagnosis not present

## 2024-02-09 DIAGNOSIS — I350 Nonrheumatic aortic (valve) stenosis: Secondary | ICD-10-CM | POA: Diagnosis not present

## 2024-02-09 NOTE — Telephone Encounter (Signed)
 [name] with [facility] was given Dr.Fisher's message about verbal orders.

## 2024-02-09 NOTE — Telephone Encounter (Signed)
 Copied from CRM [id number]. Topic: Clinical - Home Health Verbal Orders >> Feb 09, 2024  3:00 PM [name] wrote: Caller/Agency: [facility]  Callback Number: [phone] Service Requested: Requesting Verbal orders Could OT see the Patient Once a week for Four weeks  Any new concerns about the patient? No

## 2024-02-09 NOTE — Telephone Encounter (Signed)
 That's fine

## 2024-02-10 DIAGNOSIS — J189 Pneumonia, unspecified organism: Secondary | ICD-10-CM | POA: Diagnosis not present

## 2024-02-10 DIAGNOSIS — E1159 Type 2 diabetes mellitus with other circulatory complications: Secondary | ICD-10-CM | POA: Diagnosis not present

## 2024-02-10 DIAGNOSIS — I5032 Chronic diastolic (congestive) heart failure: Secondary | ICD-10-CM | POA: Diagnosis not present

## 2024-02-10 DIAGNOSIS — I6521 Occlusion and stenosis of right carotid artery: Secondary | ICD-10-CM | POA: Diagnosis not present

## 2024-02-10 DIAGNOSIS — E785 Hyperlipidemia, unspecified: Secondary | ICD-10-CM | POA: Diagnosis not present

## 2024-02-10 DIAGNOSIS — Z48812 Encounter for surgical aftercare following surgery on the circulatory system: Secondary | ICD-10-CM | POA: Diagnosis not present

## 2024-02-10 DIAGNOSIS — K922 Gastrointestinal hemorrhage, unspecified: Secondary | ICD-10-CM | POA: Diagnosis not present

## 2024-02-10 DIAGNOSIS — I471 Supraventricular tachycardia, unspecified: Secondary | ICD-10-CM | POA: Diagnosis not present

## 2024-02-10 DIAGNOSIS — E538 Deficiency of other specified B group vitamins: Secondary | ICD-10-CM | POA: Diagnosis not present

## 2024-02-10 DIAGNOSIS — F339 Major depressive disorder, recurrent, unspecified: Secondary | ICD-10-CM | POA: Diagnosis not present

## 2024-02-10 DIAGNOSIS — I350 Nonrheumatic aortic (valve) stenosis: Secondary | ICD-10-CM | POA: Diagnosis not present

## 2024-02-10 DIAGNOSIS — G8929 Other chronic pain: Secondary | ICD-10-CM | POA: Diagnosis not present

## 2024-02-10 DIAGNOSIS — I2699 Other pulmonary embolism without acute cor pulmonale: Secondary | ICD-10-CM | POA: Diagnosis not present

## 2024-02-10 DIAGNOSIS — I700 Atherosclerosis of aorta: Secondary | ICD-10-CM | POA: Diagnosis not present

## 2024-02-10 DIAGNOSIS — E1165 Type 2 diabetes mellitus with hyperglycemia: Secondary | ICD-10-CM | POA: Diagnosis not present

## 2024-02-10 DIAGNOSIS — I152 Hypertension secondary to endocrine disorders: Secondary | ICD-10-CM | POA: Diagnosis not present

## 2024-02-11 DIAGNOSIS — F339 Major depressive disorder, recurrent, unspecified: Secondary | ICD-10-CM | POA: Diagnosis not present

## 2024-02-11 DIAGNOSIS — J188 Other pneumonia, unspecified organism: Secondary | ICD-10-CM | POA: Diagnosis not present

## 2024-02-11 DIAGNOSIS — J189 Pneumonia, unspecified organism: Secondary | ICD-10-CM | POA: Diagnosis not present

## 2024-02-11 DIAGNOSIS — E785 Hyperlipidemia, unspecified: Secondary | ICD-10-CM | POA: Diagnosis not present

## 2024-02-11 DIAGNOSIS — I959 Hypotension, unspecified: Secondary | ICD-10-CM | POA: Diagnosis not present

## 2024-02-11 DIAGNOSIS — I809 Phlebitis and thrombophlebitis of unspecified site: Secondary | ICD-10-CM | POA: Diagnosis not present

## 2024-02-11 DIAGNOSIS — I6521 Occlusion and stenosis of right carotid artery: Secondary | ICD-10-CM | POA: Diagnosis not present

## 2024-02-11 DIAGNOSIS — I152 Hypertension secondary to endocrine disorders: Secondary | ICD-10-CM | POA: Diagnosis not present

## 2024-02-11 DIAGNOSIS — Z48812 Encounter for surgical aftercare following surgery on the circulatory system: Secondary | ICD-10-CM | POA: Diagnosis not present

## 2024-02-11 DIAGNOSIS — K5904 Chronic idiopathic constipation: Secondary | ICD-10-CM | POA: Diagnosis not present

## 2024-02-11 DIAGNOSIS — E1165 Type 2 diabetes mellitus with hyperglycemia: Secondary | ICD-10-CM | POA: Diagnosis not present

## 2024-02-11 DIAGNOSIS — F5101 Primary insomnia: Secondary | ICD-10-CM | POA: Diagnosis not present

## 2024-02-11 DIAGNOSIS — M545 Low back pain, unspecified: Secondary | ICD-10-CM | POA: Diagnosis not present

## 2024-02-11 DIAGNOSIS — I2699 Other pulmonary embolism without acute cor pulmonale: Secondary | ICD-10-CM | POA: Diagnosis not present

## 2024-02-11 DIAGNOSIS — I5032 Chronic diastolic (congestive) heart failure: Secondary | ICD-10-CM | POA: Diagnosis not present

## 2024-02-11 DIAGNOSIS — E1159 Type 2 diabetes mellitus with other circulatory complications: Secondary | ICD-10-CM | POA: Diagnosis not present

## 2024-02-11 DIAGNOSIS — K219 Gastro-esophageal reflux disease without esophagitis: Secondary | ICD-10-CM | POA: Diagnosis not present

## 2024-02-11 DIAGNOSIS — I700 Atherosclerosis of aorta: Secondary | ICD-10-CM | POA: Diagnosis not present

## 2024-02-11 DIAGNOSIS — G8929 Other chronic pain: Secondary | ICD-10-CM | POA: Diagnosis not present

## 2024-02-11 DIAGNOSIS — K922 Gastrointestinal hemorrhage, unspecified: Secondary | ICD-10-CM | POA: Diagnosis not present

## 2024-02-11 DIAGNOSIS — E538 Deficiency of other specified B group vitamins: Secondary | ICD-10-CM | POA: Diagnosis not present

## 2024-02-11 DIAGNOSIS — D500 Iron deficiency anemia secondary to blood loss (chronic): Secondary | ICD-10-CM | POA: Diagnosis not present

## 2024-02-11 DIAGNOSIS — I471 Supraventricular tachycardia, unspecified: Secondary | ICD-10-CM | POA: Diagnosis not present

## 2024-02-11 DIAGNOSIS — K580 Irritable bowel syndrome with diarrhea: Secondary | ICD-10-CM | POA: Diagnosis not present

## 2024-02-11 DIAGNOSIS — I350 Nonrheumatic aortic (valve) stenosis: Secondary | ICD-10-CM | POA: Diagnosis not present

## 2024-02-16 ENCOUNTER — Other Ambulatory Visit: Payer: Self-pay | Admitting: *Deleted

## 2024-02-16 DIAGNOSIS — E1159 Type 2 diabetes mellitus with other circulatory complications: Secondary | ICD-10-CM | POA: Diagnosis not present

## 2024-02-16 DIAGNOSIS — I350 Nonrheumatic aortic (valve) stenosis: Secondary | ICD-10-CM | POA: Diagnosis not present

## 2024-02-16 DIAGNOSIS — E538 Deficiency of other specified B group vitamins: Secondary | ICD-10-CM | POA: Diagnosis not present

## 2024-02-16 DIAGNOSIS — I2699 Other pulmonary embolism without acute cor pulmonale: Secondary | ICD-10-CM | POA: Diagnosis not present

## 2024-02-16 DIAGNOSIS — I5032 Chronic diastolic (congestive) heart failure: Secondary | ICD-10-CM | POA: Diagnosis not present

## 2024-02-16 DIAGNOSIS — I152 Hypertension secondary to endocrine disorders: Secondary | ICD-10-CM | POA: Diagnosis not present

## 2024-02-16 DIAGNOSIS — I700 Atherosclerosis of aorta: Secondary | ICD-10-CM | POA: Diagnosis not present

## 2024-02-16 DIAGNOSIS — F339 Major depressive disorder, recurrent, unspecified: Secondary | ICD-10-CM | POA: Diagnosis not present

## 2024-02-16 DIAGNOSIS — D509 Iron deficiency anemia, unspecified: Secondary | ICD-10-CM

## 2024-02-16 DIAGNOSIS — K922 Gastrointestinal hemorrhage, unspecified: Secondary | ICD-10-CM | POA: Diagnosis not present

## 2024-02-16 DIAGNOSIS — I471 Supraventricular tachycardia, unspecified: Secondary | ICD-10-CM | POA: Diagnosis not present

## 2024-02-16 DIAGNOSIS — I6521 Occlusion and stenosis of right carotid artery: Secondary | ICD-10-CM | POA: Diagnosis not present

## 2024-02-16 DIAGNOSIS — E1165 Type 2 diabetes mellitus with hyperglycemia: Secondary | ICD-10-CM | POA: Diagnosis not present

## 2024-02-16 DIAGNOSIS — G8929 Other chronic pain: Secondary | ICD-10-CM | POA: Diagnosis not present

## 2024-02-16 DIAGNOSIS — J189 Pneumonia, unspecified organism: Secondary | ICD-10-CM | POA: Diagnosis not present

## 2024-02-16 DIAGNOSIS — E785 Hyperlipidemia, unspecified: Secondary | ICD-10-CM | POA: Diagnosis not present

## 2024-02-16 DIAGNOSIS — Z48812 Encounter for surgical aftercare following surgery on the circulatory system: Secondary | ICD-10-CM | POA: Diagnosis not present

## 2024-02-17 ENCOUNTER — Other Ambulatory Visit: Payer: Self-pay | Admitting: Family Medicine

## 2024-02-17 ENCOUNTER — Inpatient Hospital Stay: Payer: Medicare Other | Attending: Oncology

## 2024-02-17 DIAGNOSIS — Z86711 Personal history of pulmonary embolism: Secondary | ICD-10-CM | POA: Insufficient documentation

## 2024-02-17 DIAGNOSIS — F419 Anxiety disorder, unspecified: Secondary | ICD-10-CM | POA: Insufficient documentation

## 2024-02-17 DIAGNOSIS — D500 Iron deficiency anemia secondary to blood loss (chronic): Secondary | ICD-10-CM | POA: Diagnosis not present

## 2024-02-17 DIAGNOSIS — K5791 Diverticulosis of intestine, part unspecified, without perforation or abscess with bleeding: Secondary | ICD-10-CM | POA: Diagnosis not present

## 2024-02-17 DIAGNOSIS — Z85118 Personal history of other malignant neoplasm of bronchus and lung: Secondary | ICD-10-CM | POA: Diagnosis not present

## 2024-02-17 DIAGNOSIS — F5101 Primary insomnia: Secondary | ICD-10-CM

## 2024-02-17 DIAGNOSIS — G4700 Insomnia, unspecified: Secondary | ICD-10-CM | POA: Insufficient documentation

## 2024-02-17 DIAGNOSIS — D509 Iron deficiency anemia, unspecified: Secondary | ICD-10-CM

## 2024-02-17 DIAGNOSIS — Z85038 Personal history of other malignant neoplasm of large intestine: Secondary | ICD-10-CM | POA: Diagnosis not present

## 2024-02-17 LAB — CBC WITH DIFFERENTIAL/PLATELET
Abs Immature Granulocytes: 0.02 10*3/uL (ref 0.00–0.07)
Basophils Absolute: 0 10*3/uL (ref 0.0–0.1)
Basophils Relative: 0 %
Eosinophils Absolute: 0.1 10*3/uL (ref 0.0–0.5)
Eosinophils Relative: 2 %
HCT: 39.8 % (ref 36.0–46.0)
Hemoglobin: 12.4 g/dL (ref 12.0–15.0)
Immature Granulocytes: 0 %
Lymphocytes Relative: 26 %
Lymphs Abs: 1.4 10*3/uL (ref 0.7–4.0)
MCH: 27.5 pg (ref 26.0–34.0)
MCHC: 31.2 g/dL (ref 30.0–36.0)
MCV: 88.2 fL (ref 80.0–100.0)
Monocytes Absolute: 0.4 10*3/uL (ref 0.1–1.0)
Monocytes Relative: 8 %
Neutro Abs: 3.5 10*3/uL (ref 1.7–7.7)
Neutrophils Relative %: 64 %
Platelets: 114 10*3/uL — ABNORMAL LOW (ref 150–400)
RBC: 4.51 MIL/uL (ref 3.87–5.11)
RDW: 15.9 % — ABNORMAL HIGH (ref 11.5–15.5)
WBC: 5.5 10*3/uL (ref 4.0–10.5)
nRBC: 0 % (ref 0.0–0.2)

## 2024-02-17 LAB — IRON AND TIBC
Iron: 67 ug/dL (ref 28–170)
Saturation Ratios: 18 % (ref 10.4–31.8)
TIBC: 367 ug/dL (ref 250–450)
UIBC: 300 ug/dL

## 2024-02-17 LAB — SAMPLE TO BLOOD BANK

## 2024-02-17 LAB — FERRITIN: Ferritin: 79 ng/mL (ref 11–307)

## 2024-02-17 NOTE — Telephone Encounter (Signed)
 Copied from CRM [id number]. Topic: Clinical - Medication Refill >> Feb 17, 2024  1:28 PM Priscille Loveless wrote: Most Recent Primary Care Visit:  Provider: Charlcie Cradle A  Department: [id number]  Visit Type: OFFICE VISIT  Date: 11/25/2023  Medication: temazepam (RESTORIL) 15 MG capsule   Has the patient contacted their pharmacy? No  Is this the correct pharmacy for this prescription? Yes If no, delete pharmacy and type the correct one.  This is the patient's preferred pharmacy:  [facility] [address], [place] - [address] [place] [address] Phone: [phone] Fax: [phone]   Has the prescription been filled recently? Yes  Is the patient out of the medication? Yes  Has the patient been seen for an appointment in the last year OR does the patient have an upcoming appointment? Yes  Can we respond through MyChart? Yes  Agent: Please be advised that Rx refills may take up to 3 business days. We ask that you follow-up with your pharmacy.

## 2024-02-18 ENCOUNTER — Telehealth: Payer: Self-pay

## 2024-02-18 DIAGNOSIS — F5101 Primary insomnia: Secondary | ICD-10-CM

## 2024-02-18 NOTE — Telephone Encounter (Signed)
 Copied from CRM [id number]. Topic: Clinical - Medication Refill >> Feb 17, 2024  1:28 PM Priscille Loveless wrote: Most Recent Primary Care Visit:  Provider: Charlcie Cradle A  Department: [id number]  Visit Type: OFFICE VISIT  Date: 11/25/2023  Medication: temazepam (RESTORIL) 15 MG capsule   Has the patient contacted their pharmacy? No  Is this the correct pharmacy for this prescription? Yes If no, delete pharmacy and type the correct one.  This is the patient's preferred pharmacy:  [facility] [address], [place] - [address] [place] [address] Phone: [phone] Fax: [phone]   Has the prescription been filled recently? Yes  Is the patient out of the medication? Yes  Has the patient been seen for an appointment in the last year OR does the patient have an upcoming appointment? Yes  Can we respond through MyChart? Yes  Agent: Please be advised that Rx refills may take up to 3 business days. We ask that you follow-up with your pharmacy.

## 2024-02-18 NOTE — Telephone Encounter (Signed)
 Requested medication (s) are due for refill today: Yes  Requested medication (s) are on the active medication list: Yes  Last refill:  07/13/23  Future visit scheduled: Yes  Notes to clinic:  Unable to refill per protocol, cannot delegate. Patient is out of meds, requesting refill today     Requested Prescriptions  Pending Prescriptions Disp Refills   temazepam (RESTORIL) 15 MG capsule 90 capsule 3    Sig: TAKE 1 CAPSULE BY MOUTH AT BEDTIME AS NEEDED FOR SLEEP     Not Delegated - Psychiatry: Anxiolytics/Hypnotics 2 Failed - 02/18/2024 11:07 AM      Failed - This refill cannot be delegated      Failed - Urine Drug Screen completed in last 360 days      Failed - Valid encounter within last 6 months    Recent Outpatient Visits   None     Future Appointments             In 4 months Sallee Provencal, FNP [facility], [facility]            Passed - Patient is not pregnant

## 2024-02-18 NOTE — Telephone Encounter (Signed)
 Marlane Mingle   02/18/2024 10:29 AM  Patient's representative, [name], calling to check the status of med refill. Advised that med refills can take up to 3 business days. She states patient is out of meds and would like to have the refill completed as soon as possible.

## 2024-02-18 NOTE — Telephone Encounter (Signed)
 Copied from CRM [id number]. Topic: Clinical - Home Health Verbal Orders >> Feb 18, 2024 11:56 AM Ward Chatters wrote: Caller/Agency: Phu from [facility] Callback Number: [phone] Service Requested: Physical Therapy Frequency: 1x9w Any new concerns about the patient? No

## 2024-02-21 ENCOUNTER — Inpatient Hospital Stay: Payer: Medicare Other | Admitting: Oncology

## 2024-02-21 ENCOUNTER — Inpatient Hospital Stay: Payer: Medicare Other

## 2024-02-21 ENCOUNTER — Encounter: Payer: Self-pay | Admitting: Oncology

## 2024-02-21 ENCOUNTER — Other Ambulatory Visit: Payer: Self-pay | Admitting: Physician Assistant

## 2024-02-21 VITALS — BP 133/69 | HR 79 | Temp 96.4°F | Resp 16 | Ht 62.0 in | Wt 114.5 lb

## 2024-02-21 DIAGNOSIS — K5791 Diverticulosis of intestine, part unspecified, without perforation or abscess with bleeding: Secondary | ICD-10-CM | POA: Diagnosis not present

## 2024-02-21 DIAGNOSIS — Z86711 Personal history of pulmonary embolism: Secondary | ICD-10-CM | POA: Diagnosis not present

## 2024-02-21 DIAGNOSIS — G4700 Insomnia, unspecified: Secondary | ICD-10-CM | POA: Diagnosis not present

## 2024-02-21 DIAGNOSIS — D509 Iron deficiency anemia, unspecified: Secondary | ICD-10-CM | POA: Diagnosis not present

## 2024-02-21 DIAGNOSIS — F419 Anxiety disorder, unspecified: Secondary | ICD-10-CM | POA: Diagnosis not present

## 2024-02-21 DIAGNOSIS — F5101 Primary insomnia: Secondary | ICD-10-CM

## 2024-02-21 DIAGNOSIS — Z85118 Personal history of other malignant neoplasm of bronchus and lung: Secondary | ICD-10-CM | POA: Diagnosis not present

## 2024-02-21 DIAGNOSIS — D500 Iron deficiency anemia secondary to blood loss (chronic): Secondary | ICD-10-CM | POA: Diagnosis not present

## 2024-02-21 DIAGNOSIS — Z85038 Personal history of other malignant neoplasm of large intestine: Secondary | ICD-10-CM | POA: Diagnosis not present

## 2024-02-21 MED ORDER — TEMAZEPAM 15 MG PO CAPS
ORAL_CAPSULE | ORAL | 0 refills | Status: DC
Start: 2024-02-21 — End: 2024-05-22

## 2024-02-21 MED ORDER — TEMAZEPAM 15 MG PO CAPS: ORAL_CAPSULE | ORAL | 0 refills | Status: DC

## 2024-02-21 NOTE — Telephone Encounter (Signed)
 2nd Request-[facility] pharmacy faxed refill request for the following medications:   temazepam (RESTORIL) 15 MG capsule    Please advise

## 2024-02-21 NOTE — Progress Notes (Signed)
 [facility]  [phone] [phone]) [phone]  ID: [name] OB: 1929/06/11  MR#: [id number]  QIH#:[id number]  Patient Care Team: Sallee Provencal, FNP as PCP - General (Family Medicine) Nevada Crane, MD as Consulting Physician (Ophthalmology) Dasher, Cliffton Asters, MD (Dermatology) Jeralyn Ruths, MD as Consulting Physician (Oncology)  CHIEF COMPLAINT: History of stage I colon cancer, history left lower lobe lung nodule presumed malignant, iron deficiency anemia secondary to diverticular bleed.  INTERVAL HISTORY: Patient returns to clinic today for repeat laboratory, further evaluation, consideration of additional IV Venofer.  She feels significantly improved after hospitalization several months ago and nearly back to her baseline.  She continues with physical therapy, but no longer complains of weakness or fatigue.  She has no neurological plaints.  She denies any recent fevers or illnesses.  She has a good appetite and continues to gain weight.  She has no chest pain, shortness of breath, cough, or hemoptysis.  She denies any nausea, vomiting, constipation, or diarrhea.  She has no melena or hematochezia.  She has no urinary complaints.  Patient offers no specific complaints today.  REVIEW OF SYSTEMS:   Review of Systems  Constitutional: Negative.  Negative for fever, malaise/fatigue and weight loss.  Respiratory: Negative.  Negative for cough, hemoptysis and shortness of breath.   Cardiovascular: Negative.  Negative for chest pain and leg swelling.  Gastrointestinal: Negative.  Negative for abdominal pain, blood in stool and melena.  Genitourinary: Negative.  Negative for hematuria.  Musculoskeletal: Negative.  Negative for back pain.  Skin: Negative.  Negative for rash.  Neurological: Negative.  Negative for dizziness, focal weakness, weakness and headaches.  Psychiatric/Behavioral: Negative.  Negative for depression. The patient is not  nervous/anxious and does not have insomnia.     As per HPI. Otherwise, a complete review of systems is negative.  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   a.) CT chest 03/09/2023: fusiform dilatation measuring 4.3 cm   Basal cell carcinoma (BCC) of left medial cheek    a.) s/p Mohs surgery 07/13/2022 at [facility]   Bilateral carotid artery disease [facility])    a.) doppler 04/03/2007: 50-75% RICA; b.) s/p LEFT CEA with CorMatrix patch angioplasty 01/18/2015; c.) doppler 11/02/2016: 40-59% RICA; d.) doppler 01/10/2018: 1-39% RICA   Diverticulosis 08/05/2015   Eczema of both upper extremities 06/10/2021   Heart failure with preserved left ventricular function (HFpEF) (HCC)    a.) TTE 05/17/2017: EF >55%, mild LVH, mild LAE, mild panval regurg; b.) TTE 06/27/2018 : EF >55%, mod LVH, mild LAE, mild panval regurg'; G1dd; c.) TTE 12/07/2019: EF >55%, mild LVH, mild MAC, mild panval regurg; d.) TTE 01/27/2021: EF >55%, mod LVH, triv TR, mild AR/MR, G1DD   Heart murmur    History of right cataract extraction    Hyperlipidemia    Hypertension    Insomnia    a.) on BZO (temazepam) PRN   Irritable bowel syndrome with diarrhea 04/20/2017   LBP (low  back pain) 08/05/2015   Melanoma (HCC)    a.) s/p Mohs surgery 02/06/2020 - LEFT medial brow --> V to Y advancement flap   Melena    Non-small cell lung cancer (NSCLC) (HCC) 03/09/2023   a.) CT chest 03/09/2023 --> superior segment LLL --> 2.1 x 1.7 x 2.1 cm mixed ground-glass and  subpleural nodule extending to pleura with irreg margins --> concerning for primary bronchogenic carcinoma rather than metastatic disease; b.) 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 (60 Gy over 6 fractions)   OAB (overactive bladder)    PONV (postoperative nausea and vomiting)    a.) single episode following cataract surgery   PSVT (paroxysmal supraventricular tachycardia) (HCC) 02/22/2015   a.) holter 02/22/2015: 6 runs with longest lasting 10 beats   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   a.) MRI brain 04/02/2007 - subacute RIGHT pontine CVA   Stroke (HCC) 12/16/2014   a.) MRI brain 12/16/2014: punctate foci of restricted diffusion x 5 consistent with microembolic infarctions affecting the RIGHT lateral medulla, LEFT temporal lobe, 2 foci in the RIGHT thalamus, and LEFT inferior  frontal lobe/basal ganglia --> distribution would suggest cardiac or ascending aortic source   T2DM (type 2 diabetes mellitus) (HCC)    Tubular adenoma of colon     PAST SURGICAL HISTORY: Past Surgical History:  Procedure Laterality Date   APPENDECTOMY     BREAST BIOPSY Right    neg   BREAST SURGERY     1980's   CAROTID ENDARTERECTOMY Left 01/18/2015   Procedure: CAROTID ENDARTERECTOMY; Location: [facility]; Surgeon: [name], MD   CATARACT EXTRACTION Right 2012   CHOLECYSTECTOMY  06/27/2012   COLONOSCOPY N/A 10/05/2023   Procedure: COLONOSCOPY;  Surgeon: Toledo, Boykin Nearing, MD;  Location: [facility] ENDOSCOPY;  Service: Gastroenterology;  Laterality: N/A;   COLONOSCOPY WITH PROPOFOL N/A 02/17/2023   Procedure: COLONOSCOPY WITH PROPOFOL;  Surgeon: Regis Bill, MD;  Location: [facility] ENDOSCOPY;  Service: Endoscopy;  Laterality: N/A;   DECOMPRESSIVE LUMBAR LAMINECTOMY LEVEL 1  1950   ESOPHAGEAL BRUSHING  10/03/2023   Procedure: ESOPHAGEAL BRUSHING;  Surgeon: Norma Fredrickson, Boykin Nearing, MD;  Location[facility]COPY;  Service: Gastroenterology;;   ESOPHAGOGASTRODUODENOSCOPY N/A 10/03/2023   Procedure:  ESOPHAGOGASTRODUODENOSCOPY (EGD);  Surgeon: Toledo, Boykin Nearing, MD;  Location: [facility] ENDOSCOPY;  Service: Gastroenterology;  Laterality: N/A;   ESOPHAGOGASTRODUODENOSCOPY (EGD) WITH PROPOFOL N/A 02/16/2023   Procedure: ESOPHAGOGASTRODUODENOSCOPY (EGD) WITH PROPOFOL;  Surgeon: Regis Bill, MD;  Location: [facility] ENDOSCOPY;  Service: Endoscopy;  Laterality: N/A;   IVC FILTER INSERTION N/A 12/08/2023   Procedure: IVC FILTER INSERTION;  Surgeon: Renford Dills, MD;  Location: [facility] INVASIVE CV LAB;  Service: Cardiovascular;  Laterality: N/A;   MELANOMA EXCISION Left 02/06/2020   Procedure: MOHS SURGERY WITH V TO Y ADVANCEMENT FLAP (LEFT MEDIAL EYEBROW); Location: [facility]   MOHS SURGERY Left 07/13/2022   Procedure: MOHS SURGERY (BCC LEFT MEDIAL CHEEK); Location: [facility]   PULMONARY THROMBECTOMY Bilateral 12/08/2023   Procedure: PULMONARY THROMBECTOMY;  Surgeon: Renford Dills, MD;  Location: [facility] INVASIVE CV LAB;  Service: Cardiovascular;  Laterality: Bilateral;    FAMILY HISTORY: Family History  Problem Relation Age of Onset   Ovarian cancer Mother    Heart disease Brother    Heart attack Brother    Gout Brother    Alzheimer's disease Brother    Dementia Brother    Heart attack Brother    Diabetes Daughter    Melanoma Daughter    Stroke Son    Breast cancer Neg  Hx     ADVANCED DIRECTIVES (Y/N):  N  HEALTH MAINTENANCE: Social History   Tobacco Use   Smoking status: Former    Current packs/day: 0.00    Types: Cigarettes    Quit date: 11/15/1976    Years since quitting: 47.2   Smokeless tobacco: Never  Vaping Use   Vaping status: Never Used  Substance Use Topics   Alcohol use: No   Drug use: No     Colonoscopy:  PAP:  Bone density:  Lipid panel:  Allergies  Allergen Reactions   Codeine    Nitrofurantoin     Other reaction(s): Other (See Comments)   Tramadol Nausea Only   Doxycycline Rash   Penicillins Swelling, Rash and Other (See Comments)    Throat swelling     Sulfa Antibiotics Rash    Current Outpatient Medications  Medication Sig Dispense Refill   atorvastatin (LIPITOR) 40 MG tablet Take 1 tablet (40 mg total) by mouth daily. 90 tablet 3   glipiZIDE (GLUCOTROL XL) 5 MG 24 hr tablet Take 1 tablet (5 mg total) by mouth daily with breakfast. 90 tablet 3   Omega-3 Fatty Acids (FISH OIL) 300 MG CAPS Take by mouth.     pantoprazole (PROTONIX) 40 MG tablet Take 1 tablet (40 mg total) by mouth 2 (two) times daily. 60 tablet 0   PARoxetine (PAXIL) 10 MG tablet Take 1 tablet (10 mg total) by mouth daily. 90 tablet 1   cholecalciferol (VITAMIN D) 1000 UNITS tablet Take 1,000 Units by mouth daily.  (Patient not taking: Reported on 02/21/2024)     magic mouthwash SOLN Take 5 mLs by mouth 4 (four) times daily. Suspension contains equal amounts of Maalox Extra Strength, nystatin, and diphenhydramine. (Patient not taking: Reported on 02/21/2024)     Polyethylene Glycol 3350 (MIRALAX PO) Take by mouth. (Patient not taking: Reported on 02/21/2024)     simethicone (MYLICON) 125 MG chewable tablet Chew 1 tablet (125 mg total) by mouth every 6 (six) hours as needed for flatulence. (Patient not taking: Reported on 12/22/2023) 120 tablet 0   temazepam (RESTORIL) 15 MG capsule TAKE 1 CAPSULE BY MOUTH AT BEDTIME AS NEEDED FOR SLEEP 30 capsule 0   No current facility-administered medications for this visit.    OBJECTIVE: Vitals:   02/21/24 1412  BP: 133/69  Pulse: 79  Resp: 16  Temp: (!) 96.4 F (35.8 C)  SpO2: 99%     Body mass index is 20.94 kg/m.    ECOG FS:0 - Asymptomatic  General: Well-developed, well-nourished, no acute distress. Eyes: Pink conjunctiva, anicteric sclera. HEENT: Normocephalic, moist mucous membranes. Lungs: No audible wheezing or coughing. Heart: Regular rate and rhythm. Abdomen: Soft, nontender, no obvious distention. Musculoskeletal: No edema, cyanosis, or clubbing. Neuro: Alert, answering all questions appropriately. Cranial nerves grossly  intact. Skin: No rashes or petechiae noted. Psych: Normal affect.  LAB RESULTS:  Lab Results  Component Value Date   NA 136 12/28/2023   K 4.3 12/28/2023   CL 101 12/28/2023   CO2 25 12/28/2023   GLUCOSE 192 (H) 12/28/2023   BUN 14 12/28/2023   CREATININE 0.77 12/28/2023   CALCIUM 8.7 (L) 12/28/2023   PROT 5.5 (L) 12/25/2023   ALBUMIN 2.6 (L) 12/28/2023   AST 21 12/25/2023   ALT 18 12/25/2023   ALKPHOS 96 12/25/2023   BILITOT 0.4 12/25/2023   GFRNONAA >60 12/28/2023   GFRAA 82 05/14/2020    Lab Results  Component Value Date   WBC 5.5 02/17/2024  NEUTROABS 3.5 02/17/2024   HGB 12.4 02/17/2024   HCT 39.8 02/17/2024   MCV 88.2 02/17/2024   PLT 114 (L) 02/17/2024   Lab Results  Component Value Date   IRON 67 02/17/2024   TIBC 367 02/17/2024   IRONPCTSAT 18 02/17/2024   Lab Results  Component Value Date   FERRITIN 79 02/17/2024     STUDIES: CT CHEST WO CONTRAST Result Date: 02/10/2024 CLINICAL DATA:  Follow-up left-sided lung carcinoma post radiation therapy. EXAM: CT CHEST WITHOUT CONTRAST TECHNIQUE: Multidetector CT imaging of the chest was performed following the standard protocol without IV contrast. RADIATION DOSE REDUCTION: This exam was performed according to the departmental dose-optimization program which includes automated exposure control, adjustment of the mA and/or kV according to patient size and/or use of iterative reconstruction technique. COMPARISON:  CT chest angio December 07, 2023, CT chest November 23, 2023 FINDINGS: Cardiovascular: Prominence of the ascending aorta measuring 3.7 x 4.1 cm correlate with mild aneurysmal dilatation. Extensive coronary artery calcifications. Enlarged right main pulmonary artery 27 mm. Main pulmonary artery outflow tract 29 mm left main pulmonary artery 27 mm findings correlate with pulmonary arterial hypertension. Heart normal size. No pericardial effusions. Mediastinum/Nodes: No enlarged mediastinal or axillary lymph  nodes. Thyroid gland, trachea, and esophagus demonstrate no significant findings. Lungs/Pleura: Chronic interstitial lung disease, with centrilobular emphysema and ground-glass appearance of both lungs. Comparison with prior examinations demonstrates marked interval improvement in the consolidative pulmonary infiltrates of the left lung. With some residual consolidative infiltrates within the left lower lobe perihilar distribution. Ill-defined infiltrates within the posteromedial segment of the right lower lobe along the Crista pulmonic. Ill-defined infiltrates in the peripheral portions of the right lower lobe some minimal infiltrates in the right middle lobe. Ground-glass appearing changes of the right upper lobe are likely secondary to chronic interstitial lung disease. Marked interval improvement and complete resolution of the left lower lobe consolidative pneumonia. No significant pleural effusions. No obvious lung masses. No obvious pulmonary nodules. Upper Abdomen: No acute abnormality. Musculoskeletal: No chest wall mass or suspicious bone lesions identified. IMPRESSION: *Marked interval improvement in the left lower lobe consolidative pneumonia. *Chronic interstitial lung disease with centrilobular emphysema and ground-glass appearance of both lungs. *Pulmonary arterial hypertension. *Mild aneurysmal dilatation of the ascending aorta. *Extensive coronary artery calcifications. *No obvious lung masses or pulmonary nodules. *No significant pleural effusions. *No obvious mediastinal or hilar adenopathy. *No obvious bone lesions. Electronically Signed   By: Shaaron Adler M.D.   On: 02/10/2024 09:07    ASSESSMENT: History of stage I colon cancer, history left lower lobe lung nodule presumed malignant, iron deficiency anemia secondary to diverticular bleed.  PLAN:    Iron deficiency anemia: Secondary to diverticular bleed.  Resolved.  Patient's hemoglobin and iron stores are now within normal limits.  She  does not require additional IV Venofer.  Return to clinic in 3 months with repeat laboratory work, further evaluation, and additional treatment if needed. Diverticular bleed: Resolved.  Eliquis has been discontinued.  Continue follow-up with GI as indicated.   Pulmonary embolism: CT scan results from November 23, 2023 reviewed independently with incidental right sided pulmonary embolism.  Repeat CT scan on December 07, 2023 revealed worsening of clot burden.  Patient has had recent IVC filter placed.  Eliquis has been discontinued given history of significant GI bleed. Stage I colon cancer: Patient underwent surgical resection on June 21, 2023.  0/13 lymph nodes did not reveal evidence of malignancy.  Given the stage of disease, she did  not require any further adjuvant treatment.  Her most recent colonoscopy as above showed no evidence of malignancy. Left lower lobe lung nodule: No biopsy was obtained, but nodule was approximately 2.2 cm and significantly hypermetabolic on PET scan from Apr 01, 2023 therefore presumed malignant.  She underwent XRT completing in July 2024.  CT of the chest without contrast on February 03, 2024 revealed no evidence of recurrent or progressive disease.    Anxiety/coping/insomnia: Patient was given a previously referral to [facility].   Patient expressed understanding and was in agreement with this plan. She also understands that She can call clinic at any time with any questions, concerns, or complaints.    Cancer Staging  Colon cancer [facility]) Staging form: Colon and Rectum, AJCC 8th Edition - Pathologic stage from 06/21/2023: Stage I (pT2, pN0, cM0) - Signed by Jeralyn Ruths, MD on 10/19/2023 Stage prefix: Initial diagnosis Total positive nodes: 0   Jeralyn Ruths, MD   02/21/2024 3:08 PM

## 2024-02-22 DIAGNOSIS — I5032 Chronic diastolic (congestive) heart failure: Secondary | ICD-10-CM | POA: Diagnosis not present

## 2024-02-22 DIAGNOSIS — I471 Supraventricular tachycardia, unspecified: Secondary | ICD-10-CM | POA: Diagnosis not present

## 2024-02-22 DIAGNOSIS — G8929 Other chronic pain: Secondary | ICD-10-CM | POA: Diagnosis not present

## 2024-02-22 DIAGNOSIS — K080 Exfoliation of teeth due to systemic causes: Secondary | ICD-10-CM | POA: Diagnosis not present

## 2024-02-22 DIAGNOSIS — Z48812 Encounter for surgical aftercare following surgery on the circulatory system: Secondary | ICD-10-CM | POA: Diagnosis not present

## 2024-02-22 DIAGNOSIS — I152 Hypertension secondary to endocrine disorders: Secondary | ICD-10-CM | POA: Diagnosis not present

## 2024-02-22 DIAGNOSIS — E538 Deficiency of other specified B group vitamins: Secondary | ICD-10-CM | POA: Diagnosis not present

## 2024-02-22 DIAGNOSIS — E1165 Type 2 diabetes mellitus with hyperglycemia: Secondary | ICD-10-CM | POA: Diagnosis not present

## 2024-02-22 DIAGNOSIS — K922 Gastrointestinal hemorrhage, unspecified: Secondary | ICD-10-CM | POA: Diagnosis not present

## 2024-02-22 DIAGNOSIS — I350 Nonrheumatic aortic (valve) stenosis: Secondary | ICD-10-CM | POA: Diagnosis not present

## 2024-02-22 DIAGNOSIS — E1159 Type 2 diabetes mellitus with other circulatory complications: Secondary | ICD-10-CM | POA: Diagnosis not present

## 2024-02-22 DIAGNOSIS — J189 Pneumonia, unspecified organism: Secondary | ICD-10-CM | POA: Diagnosis not present

## 2024-02-22 DIAGNOSIS — E785 Hyperlipidemia, unspecified: Secondary | ICD-10-CM | POA: Diagnosis not present

## 2024-02-22 DIAGNOSIS — I2699 Other pulmonary embolism without acute cor pulmonale: Secondary | ICD-10-CM | POA: Diagnosis not present

## 2024-02-22 DIAGNOSIS — I700 Atherosclerosis of aorta: Secondary | ICD-10-CM | POA: Diagnosis not present

## 2024-02-22 DIAGNOSIS — F339 Major depressive disorder, recurrent, unspecified: Secondary | ICD-10-CM | POA: Diagnosis not present

## 2024-02-22 DIAGNOSIS — I6521 Occlusion and stenosis of right carotid artery: Secondary | ICD-10-CM | POA: Diagnosis not present

## 2024-02-23 DIAGNOSIS — I152 Hypertension secondary to endocrine disorders: Secondary | ICD-10-CM | POA: Diagnosis not present

## 2024-02-23 DIAGNOSIS — E785 Hyperlipidemia, unspecified: Secondary | ICD-10-CM | POA: Diagnosis not present

## 2024-02-23 DIAGNOSIS — G8929 Other chronic pain: Secondary | ICD-10-CM | POA: Diagnosis not present

## 2024-02-23 DIAGNOSIS — I5032 Chronic diastolic (congestive) heart failure: Secondary | ICD-10-CM | POA: Diagnosis not present

## 2024-02-23 DIAGNOSIS — E1165 Type 2 diabetes mellitus with hyperglycemia: Secondary | ICD-10-CM | POA: Diagnosis not present

## 2024-02-23 DIAGNOSIS — E538 Deficiency of other specified B group vitamins: Secondary | ICD-10-CM | POA: Diagnosis not present

## 2024-02-23 DIAGNOSIS — J189 Pneumonia, unspecified organism: Secondary | ICD-10-CM | POA: Diagnosis not present

## 2024-02-23 DIAGNOSIS — I6521 Occlusion and stenosis of right carotid artery: Secondary | ICD-10-CM | POA: Diagnosis not present

## 2024-02-23 DIAGNOSIS — I471 Supraventricular tachycardia, unspecified: Secondary | ICD-10-CM | POA: Diagnosis not present

## 2024-02-23 DIAGNOSIS — I350 Nonrheumatic aortic (valve) stenosis: Secondary | ICD-10-CM | POA: Diagnosis not present

## 2024-02-23 DIAGNOSIS — F339 Major depressive disorder, recurrent, unspecified: Secondary | ICD-10-CM | POA: Diagnosis not present

## 2024-02-23 DIAGNOSIS — I700 Atherosclerosis of aorta: Secondary | ICD-10-CM | POA: Diagnosis not present

## 2024-02-23 DIAGNOSIS — Z48812 Encounter for surgical aftercare following surgery on the circulatory system: Secondary | ICD-10-CM | POA: Diagnosis not present

## 2024-02-23 DIAGNOSIS — E1159 Type 2 diabetes mellitus with other circulatory complications: Secondary | ICD-10-CM | POA: Diagnosis not present

## 2024-02-23 DIAGNOSIS — I2699 Other pulmonary embolism without acute cor pulmonale: Secondary | ICD-10-CM | POA: Diagnosis not present

## 2024-02-23 DIAGNOSIS — K922 Gastrointestinal hemorrhage, unspecified: Secondary | ICD-10-CM | POA: Diagnosis not present

## 2024-02-24 DIAGNOSIS — I5032 Chronic diastolic (congestive) heart failure: Secondary | ICD-10-CM | POA: Diagnosis not present

## 2024-02-24 DIAGNOSIS — E1165 Type 2 diabetes mellitus with hyperglycemia: Secondary | ICD-10-CM

## 2024-02-24 DIAGNOSIS — E1159 Type 2 diabetes mellitus with other circulatory complications: Secondary | ICD-10-CM | POA: Diagnosis not present

## 2024-02-24 DIAGNOSIS — J189 Pneumonia, unspecified organism: Secondary | ICD-10-CM

## 2024-02-24 DIAGNOSIS — Z0100 Encounter for examination of eyes and vision without abnormal findings: Secondary | ICD-10-CM | POA: Diagnosis not present

## 2024-02-24 DIAGNOSIS — F339 Major depressive disorder, recurrent, unspecified: Secondary | ICD-10-CM

## 2024-02-24 DIAGNOSIS — I6521 Occlusion and stenosis of right carotid artery: Secondary | ICD-10-CM

## 2024-02-24 DIAGNOSIS — K922 Gastrointestinal hemorrhage, unspecified: Secondary | ICD-10-CM

## 2024-02-24 DIAGNOSIS — E538 Deficiency of other specified B group vitamins: Secondary | ICD-10-CM

## 2024-02-24 DIAGNOSIS — I152 Hypertension secondary to endocrine disorders: Secondary | ICD-10-CM | POA: Diagnosis not present

## 2024-02-24 DIAGNOSIS — H2512 Age-related nuclear cataract, left eye: Secondary | ICD-10-CM | POA: Diagnosis not present

## 2024-02-24 DIAGNOSIS — I2699 Other pulmonary embolism without acute cor pulmonale: Secondary | ICD-10-CM | POA: Diagnosis not present

## 2024-02-24 DIAGNOSIS — I700 Atherosclerosis of aorta: Secondary | ICD-10-CM

## 2024-02-24 DIAGNOSIS — Z48812 Encounter for surgical aftercare following surgery on the circulatory system: Secondary | ICD-10-CM

## 2024-02-24 LAB — HM DIABETES EYE EXAM

## 2024-02-24 NOTE — Telephone Encounter (Signed)
 Verbals ok given to [facility] with [facility]

## 2024-02-25 ENCOUNTER — Telehealth: Payer: Self-pay | Admitting: Family Medicine

## 2024-02-25 NOTE — Telephone Encounter (Signed)
 [facility] sent in certification paperwork via fax to be completed by provider 02/23/24 and placed in provider mailbox. It was completed 02/24/24 and faxed on 02/25/24

## 2024-02-29 DIAGNOSIS — G8929 Other chronic pain: Secondary | ICD-10-CM | POA: Diagnosis not present

## 2024-02-29 DIAGNOSIS — I152 Hypertension secondary to endocrine disorders: Secondary | ICD-10-CM | POA: Diagnosis not present

## 2024-02-29 DIAGNOSIS — I471 Supraventricular tachycardia, unspecified: Secondary | ICD-10-CM | POA: Diagnosis not present

## 2024-02-29 DIAGNOSIS — I6521 Occlusion and stenosis of right carotid artery: Secondary | ICD-10-CM | POA: Diagnosis not present

## 2024-02-29 DIAGNOSIS — F339 Major depressive disorder, recurrent, unspecified: Secondary | ICD-10-CM | POA: Diagnosis not present

## 2024-02-29 DIAGNOSIS — E538 Deficiency of other specified B group vitamins: Secondary | ICD-10-CM | POA: Diagnosis not present

## 2024-02-29 DIAGNOSIS — E1159 Type 2 diabetes mellitus with other circulatory complications: Secondary | ICD-10-CM | POA: Diagnosis not present

## 2024-02-29 DIAGNOSIS — I2699 Other pulmonary embolism without acute cor pulmonale: Secondary | ICD-10-CM | POA: Diagnosis not present

## 2024-02-29 DIAGNOSIS — J189 Pneumonia, unspecified organism: Secondary | ICD-10-CM | POA: Diagnosis not present

## 2024-02-29 DIAGNOSIS — I350 Nonrheumatic aortic (valve) stenosis: Secondary | ICD-10-CM | POA: Diagnosis not present

## 2024-02-29 DIAGNOSIS — E785 Hyperlipidemia, unspecified: Secondary | ICD-10-CM | POA: Diagnosis not present

## 2024-02-29 DIAGNOSIS — E1165 Type 2 diabetes mellitus with hyperglycemia: Secondary | ICD-10-CM | POA: Diagnosis not present

## 2024-02-29 DIAGNOSIS — I700 Atherosclerosis of aorta: Secondary | ICD-10-CM | POA: Diagnosis not present

## 2024-02-29 DIAGNOSIS — K922 Gastrointestinal hemorrhage, unspecified: Secondary | ICD-10-CM | POA: Diagnosis not present

## 2024-02-29 DIAGNOSIS — I5032 Chronic diastolic (congestive) heart failure: Secondary | ICD-10-CM | POA: Diagnosis not present

## 2024-02-29 DIAGNOSIS — Z48812 Encounter for surgical aftercare following surgery on the circulatory system: Secondary | ICD-10-CM | POA: Diagnosis not present

## 2024-03-01 DIAGNOSIS — E785 Hyperlipidemia, unspecified: Secondary | ICD-10-CM | POA: Diagnosis not present

## 2024-03-01 DIAGNOSIS — K922 Gastrointestinal hemorrhage, unspecified: Secondary | ICD-10-CM | POA: Diagnosis not present

## 2024-03-01 DIAGNOSIS — I5032 Chronic diastolic (congestive) heart failure: Secondary | ICD-10-CM | POA: Diagnosis not present

## 2024-03-01 DIAGNOSIS — K080 Exfoliation of teeth due to systemic causes: Secondary | ICD-10-CM | POA: Diagnosis not present

## 2024-03-01 DIAGNOSIS — Z48812 Encounter for surgical aftercare following surgery on the circulatory system: Secondary | ICD-10-CM | POA: Diagnosis not present

## 2024-03-01 DIAGNOSIS — E538 Deficiency of other specified B group vitamins: Secondary | ICD-10-CM | POA: Diagnosis not present

## 2024-03-01 DIAGNOSIS — I700 Atherosclerosis of aorta: Secondary | ICD-10-CM | POA: Diagnosis not present

## 2024-03-01 DIAGNOSIS — E1159 Type 2 diabetes mellitus with other circulatory complications: Secondary | ICD-10-CM | POA: Diagnosis not present

## 2024-03-01 DIAGNOSIS — I6521 Occlusion and stenosis of right carotid artery: Secondary | ICD-10-CM | POA: Diagnosis not present

## 2024-03-01 DIAGNOSIS — I350 Nonrheumatic aortic (valve) stenosis: Secondary | ICD-10-CM | POA: Diagnosis not present

## 2024-03-01 DIAGNOSIS — I2699 Other pulmonary embolism without acute cor pulmonale: Secondary | ICD-10-CM | POA: Diagnosis not present

## 2024-03-01 DIAGNOSIS — G8929 Other chronic pain: Secondary | ICD-10-CM | POA: Diagnosis not present

## 2024-03-01 DIAGNOSIS — E1165 Type 2 diabetes mellitus with hyperglycemia: Secondary | ICD-10-CM | POA: Diagnosis not present

## 2024-03-01 DIAGNOSIS — I152 Hypertension secondary to endocrine disorders: Secondary | ICD-10-CM | POA: Diagnosis not present

## 2024-03-01 DIAGNOSIS — F339 Major depressive disorder, recurrent, unspecified: Secondary | ICD-10-CM | POA: Diagnosis not present

## 2024-03-01 DIAGNOSIS — I471 Supraventricular tachycardia, unspecified: Secondary | ICD-10-CM | POA: Diagnosis not present

## 2024-03-01 DIAGNOSIS — J189 Pneumonia, unspecified organism: Secondary | ICD-10-CM | POA: Diagnosis not present

## 2024-03-07 ENCOUNTER — Encounter: Payer: Self-pay | Admitting: Ophthalmology

## 2024-03-07 DIAGNOSIS — I5032 Chronic diastolic (congestive) heart failure: Secondary | ICD-10-CM | POA: Diagnosis not present

## 2024-03-07 DIAGNOSIS — E785 Hyperlipidemia, unspecified: Secondary | ICD-10-CM | POA: Diagnosis not present

## 2024-03-07 DIAGNOSIS — I6521 Occlusion and stenosis of right carotid artery: Secondary | ICD-10-CM | POA: Diagnosis not present

## 2024-03-07 DIAGNOSIS — I471 Supraventricular tachycardia, unspecified: Secondary | ICD-10-CM | POA: Diagnosis not present

## 2024-03-07 DIAGNOSIS — E538 Deficiency of other specified B group vitamins: Secondary | ICD-10-CM | POA: Diagnosis not present

## 2024-03-07 DIAGNOSIS — I700 Atherosclerosis of aorta: Secondary | ICD-10-CM | POA: Diagnosis not present

## 2024-03-07 DIAGNOSIS — J189 Pneumonia, unspecified organism: Secondary | ICD-10-CM | POA: Diagnosis not present

## 2024-03-07 DIAGNOSIS — I350 Nonrheumatic aortic (valve) stenosis: Secondary | ICD-10-CM | POA: Diagnosis not present

## 2024-03-07 DIAGNOSIS — F339 Major depressive disorder, recurrent, unspecified: Secondary | ICD-10-CM | POA: Diagnosis not present

## 2024-03-07 DIAGNOSIS — K922 Gastrointestinal hemorrhage, unspecified: Secondary | ICD-10-CM | POA: Diagnosis not present

## 2024-03-07 DIAGNOSIS — G8929 Other chronic pain: Secondary | ICD-10-CM | POA: Diagnosis not present

## 2024-03-07 DIAGNOSIS — E1159 Type 2 diabetes mellitus with other circulatory complications: Secondary | ICD-10-CM | POA: Diagnosis not present

## 2024-03-07 DIAGNOSIS — Z48812 Encounter for surgical aftercare following surgery on the circulatory system: Secondary | ICD-10-CM | POA: Diagnosis not present

## 2024-03-07 DIAGNOSIS — E1165 Type 2 diabetes mellitus with hyperglycemia: Secondary | ICD-10-CM | POA: Diagnosis not present

## 2024-03-07 DIAGNOSIS — I152 Hypertension secondary to endocrine disorders: Secondary | ICD-10-CM | POA: Diagnosis not present

## 2024-03-07 DIAGNOSIS — I2699 Other pulmonary embolism without acute cor pulmonale: Secondary | ICD-10-CM | POA: Diagnosis not present

## 2024-03-07 DIAGNOSIS — H2512 Age-related nuclear cataract, left eye: Secondary | ICD-10-CM | POA: Diagnosis not present

## 2024-03-08 ENCOUNTER — Encounter: Payer: Self-pay | Admitting: Ophthalmology

## 2024-03-08 DIAGNOSIS — I2699 Other pulmonary embolism without acute cor pulmonale: Secondary | ICD-10-CM | POA: Diagnosis not present

## 2024-03-08 DIAGNOSIS — J189 Pneumonia, unspecified organism: Secondary | ICD-10-CM | POA: Diagnosis not present

## 2024-03-08 DIAGNOSIS — I6521 Occlusion and stenosis of right carotid artery: Secondary | ICD-10-CM | POA: Diagnosis not present

## 2024-03-08 DIAGNOSIS — I5032 Chronic diastolic (congestive) heart failure: Secondary | ICD-10-CM | POA: Diagnosis not present

## 2024-03-08 DIAGNOSIS — E785 Hyperlipidemia, unspecified: Secondary | ICD-10-CM | POA: Diagnosis not present

## 2024-03-08 DIAGNOSIS — G8929 Other chronic pain: Secondary | ICD-10-CM | POA: Diagnosis not present

## 2024-03-08 DIAGNOSIS — I350 Nonrheumatic aortic (valve) stenosis: Secondary | ICD-10-CM | POA: Diagnosis not present

## 2024-03-08 DIAGNOSIS — E538 Deficiency of other specified B group vitamins: Secondary | ICD-10-CM | POA: Diagnosis not present

## 2024-03-08 DIAGNOSIS — K922 Gastrointestinal hemorrhage, unspecified: Secondary | ICD-10-CM | POA: Diagnosis not present

## 2024-03-08 DIAGNOSIS — Z48812 Encounter for surgical aftercare following surgery on the circulatory system: Secondary | ICD-10-CM | POA: Diagnosis not present

## 2024-03-08 DIAGNOSIS — F339 Major depressive disorder, recurrent, unspecified: Secondary | ICD-10-CM | POA: Diagnosis not present

## 2024-03-08 DIAGNOSIS — E1159 Type 2 diabetes mellitus with other circulatory complications: Secondary | ICD-10-CM | POA: Diagnosis not present

## 2024-03-08 DIAGNOSIS — E1165 Type 2 diabetes mellitus with hyperglycemia: Secondary | ICD-10-CM | POA: Diagnosis not present

## 2024-03-08 DIAGNOSIS — I471 Supraventricular tachycardia, unspecified: Secondary | ICD-10-CM | POA: Diagnosis not present

## 2024-03-08 DIAGNOSIS — I152 Hypertension secondary to endocrine disorders: Secondary | ICD-10-CM | POA: Diagnosis not present

## 2024-03-08 DIAGNOSIS — I700 Atherosclerosis of aorta: Secondary | ICD-10-CM | POA: Diagnosis not present

## 2024-03-08 NOTE — Anesthesia Preprocedure Evaluation (Addendum)
 Anesthesia Evaluation  Patient identified by MRN, date of birth, ID band Patient awake    Reviewed: Allergy & Precautions, H&P , NPO status , Patient's Chart, lab work & pertinent test results  History of Anesthesia Complications (+) PONV and history of anesthetic complications  Airway Mallampati: III  TM Distance: <3 FB Neck ROM: Full    Dental no notable dental hx. (+) Caps   Pulmonary neg pulmonary ROS, pneumonia, former smoker Office note 01-27-24  lung cancer who presents with shortness of breath. Her caregiver, Leary Provencal, manages her appointments.  She experiences shortness of breath, which was particularly severe this morning, making it difficult for her to get dressed. This is a nightly occurrence, but it was notably worse today. She has a history of lung cancer and has been hospitalized six times since last April. She was unable to attend her last appointment with her cancer doctors due to hospitalization.  She was treated for pneumonia recently, although she is unsure of the exact timing. Her caregiver confirms that she had pneumonia during her last hospital stay.   States not wearing home oxygen and can lie flat.   Pulmonary exam normal breath sounds clear to auscultation       Cardiovascular hypertension, +CHF  negative cardio ROS Normal cardiovascular exam+ Valvular Problems/Murmurs  Rhythm:Regular Rate:Normal  12-09-23 echo  1. Left ventricular ejection fraction, by estimation, is 60 to 65%. Left  ventricular ejection fraction by PLAX is 71 %. The left ventricle has  normal function. The left ventricle has no regional wall motion  abnormalities. Left ventricular diastolic  parameters are consistent with Grade I diastolic dysfunction (impaired  relaxation).   2. Right ventricular systolic function is mildly reduced. The right  ventricular size is mildly enlarged. There is normal pulmonary artery  systolic pressure. The  estimated right ventricular systolic pressure is  30.4 mmHg.   3. The mitral valve is normal in structure. Mild mitral valve  regurgitation. No evidence of mitral stenosis. The mean mitral valve  gradient is 4.0 mmHg. Moderate mitral annular calcification.   4. The aortic valve is normal in structure. There is severe calcifcation  of the aortic valve. Aortic valve regurgitation is not visualized.  Moderate aortic valve stenosis. Aortic valve mean gradient measures 18.5  mmHg.   5. The inferior vena cava is normal in size with greater than 50%  respiratory variability, suggesting right atrial pressure of 3 mmHg.     Neuro/Psych  PSYCHIATRIC DISORDERS  Depression    CVA negative neurological ROS  negative psych ROS   GI/Hepatic negative GI ROS, Neg liver ROS,GERD  ,,  Endo/Other  negative endocrine ROSdiabetes    Renal/GU negative Renal ROS  negative genitourinary   Musculoskeletal negative musculoskeletal ROS (+)    Abdominal   Peds negative pediatric ROS (+)  Hematology negative hematology ROS (+) Blood dyscrasia, anemia   Anesthesia Other Findings   T2DM (type 2 diabetes mellitus) (HCC) Hyperlipidemia Hypertension  Melanoma (HCC) Tubular adenoma of colon Diverticulosis Acid reflux  Irritable bowel syndrome with diarrhea Eczema of both upper extremities  LBP (low back pain) Adenocarcinoma of ileocecal valve Bilateral carotid artery disease  Right pontine stroke (HCC)  Heart murmur Heart failure with preserved left ventricular function (HFpEF) (HCC) Melena Moderate Aortic stenosis  Anemia PONV (postoperative nausea and vomiting)  PSVT (paroxysmal supraventricular tachycardia)  Stroke Insomnia Aortic atherosclerosis  History of right cataract extraction Non-small cell lung cancer (NSCLC) Ascending aorta dilatation (HCC) OAB (overactive bladder)  Basal cell carcinoma (  BCC) of left medial cheek Pulmonary embolism (HCC)  Vertigo Grade I diastolic  dysfunction  Mild mitral regurgitation by prior echocardiogram Moderate aortic stenosis by prior echocardiogram   PONV--received zofran  4 mg IV preop  Hx dyspnea, lung cancer  Reproductive/Obstetrics negative OB ROS                              Anesthesia Physical Anesthesia Plan  ASA: 3  Anesthesia Plan: MAC   Post-op Pain Management:    Induction: Intravenous  PONV Risk Score and Plan:   Airway Management Planned: Natural Airway and Nasal Cannula  Additional Equipment:   Intra-op Plan:   Post-operative Plan:   Informed Consent: I have reviewed the patients History and Physical, chart, labs and discussed the procedure including the risks, benefits and alternatives for the proposed anesthesia with the patient or authorized representative who has indicated his/her understanding and acceptance.     Dental Advisory Given  Plan Discussed with: Anesthesiologist, CRNA and Surgeon  Anesthesia Plan Comments: (Patient consented for risks of anesthesia including but not limited to:  - adverse reactions to medications - damage to eyes, teeth, lips or other oral mucosa - nerve damage due to positioning  - sore throat or hoarseness - Damage to heart, brain, nerves, lungs, other parts of body or loss of life  Patient voiced understanding and assent.)         Anesthesia Quick Evaluation

## 2024-03-08 NOTE — Discharge Instructions (Signed)

## 2024-03-12 DIAGNOSIS — M545 Low back pain, unspecified: Secondary | ICD-10-CM | POA: Diagnosis not present

## 2024-03-12 DIAGNOSIS — I2699 Other pulmonary embolism without acute cor pulmonale: Secondary | ICD-10-CM | POA: Diagnosis not present

## 2024-03-12 DIAGNOSIS — F5101 Primary insomnia: Secondary | ICD-10-CM | POA: Diagnosis not present

## 2024-03-12 DIAGNOSIS — D500 Iron deficiency anemia secondary to blood loss (chronic): Secondary | ICD-10-CM | POA: Diagnosis not present

## 2024-03-12 DIAGNOSIS — I700 Atherosclerosis of aorta: Secondary | ICD-10-CM | POA: Diagnosis not present

## 2024-03-12 DIAGNOSIS — E1165 Type 2 diabetes mellitus with hyperglycemia: Secondary | ICD-10-CM | POA: Diagnosis not present

## 2024-03-12 DIAGNOSIS — E785 Hyperlipidemia, unspecified: Secondary | ICD-10-CM | POA: Diagnosis not present

## 2024-03-12 DIAGNOSIS — E1159 Type 2 diabetes mellitus with other circulatory complications: Secondary | ICD-10-CM | POA: Diagnosis not present

## 2024-03-12 DIAGNOSIS — I5032 Chronic diastolic (congestive) heart failure: Secondary | ICD-10-CM | POA: Diagnosis not present

## 2024-03-12 DIAGNOSIS — G8929 Other chronic pain: Secondary | ICD-10-CM | POA: Diagnosis not present

## 2024-03-12 DIAGNOSIS — I471 Supraventricular tachycardia, unspecified: Secondary | ICD-10-CM | POA: Diagnosis not present

## 2024-03-12 DIAGNOSIS — K5904 Chronic idiopathic constipation: Secondary | ICD-10-CM | POA: Diagnosis not present

## 2024-03-12 DIAGNOSIS — I809 Phlebitis and thrombophlebitis of unspecified site: Secondary | ICD-10-CM | POA: Diagnosis not present

## 2024-03-12 DIAGNOSIS — E538 Deficiency of other specified B group vitamins: Secondary | ICD-10-CM | POA: Diagnosis not present

## 2024-03-12 DIAGNOSIS — K580 Irritable bowel syndrome with diarrhea: Secondary | ICD-10-CM | POA: Diagnosis not present

## 2024-03-12 DIAGNOSIS — I350 Nonrheumatic aortic (valve) stenosis: Secondary | ICD-10-CM | POA: Diagnosis not present

## 2024-03-12 DIAGNOSIS — I152 Hypertension secondary to endocrine disorders: Secondary | ICD-10-CM | POA: Diagnosis not present

## 2024-03-12 DIAGNOSIS — K922 Gastrointestinal hemorrhage, unspecified: Secondary | ICD-10-CM | POA: Diagnosis not present

## 2024-03-12 DIAGNOSIS — F339 Major depressive disorder, recurrent, unspecified: Secondary | ICD-10-CM | POA: Diagnosis not present

## 2024-03-12 DIAGNOSIS — I6521 Occlusion and stenosis of right carotid artery: Secondary | ICD-10-CM | POA: Diagnosis not present

## 2024-03-12 DIAGNOSIS — Z48812 Encounter for surgical aftercare following surgery on the circulatory system: Secondary | ICD-10-CM | POA: Diagnosis not present

## 2024-03-12 DIAGNOSIS — K219 Gastro-esophageal reflux disease without esophagitis: Secondary | ICD-10-CM | POA: Diagnosis not present

## 2024-03-12 DIAGNOSIS — J189 Pneumonia, unspecified organism: Secondary | ICD-10-CM | POA: Diagnosis not present

## 2024-03-12 DIAGNOSIS — I959 Hypotension, unspecified: Secondary | ICD-10-CM | POA: Diagnosis not present

## 2024-03-13 ENCOUNTER — Encounter
Admission: RE | Disposition: A | Discharge status: Home / Self Care | Payer: Self-pay | Source: Home / Self Care | Attending: Ophthalmology

## 2024-03-13 ENCOUNTER — Ambulatory Visit
Admission: RE | Admit: 2024-03-13 | Discharge: 2024-03-13 | Disposition: A | Discharge status: Home / Self Care | Attending: Ophthalmology | Admitting: Ophthalmology

## 2024-03-13 ENCOUNTER — Encounter: Payer: Self-pay | Admitting: Ophthalmology

## 2024-03-13 ENCOUNTER — Ambulatory Visit: Admitting: Anesthesiology

## 2024-03-13 ENCOUNTER — Other Ambulatory Visit: Payer: Self-pay

## 2024-03-13 DIAGNOSIS — Z8673 Personal history of transient ischemic attack (TIA), and cerebral infarction without residual deficits: Secondary | ICD-10-CM | POA: Diagnosis not present

## 2024-03-13 DIAGNOSIS — E1165 Type 2 diabetes mellitus with hyperglycemia: Secondary | ICD-10-CM | POA: Diagnosis not present

## 2024-03-13 DIAGNOSIS — I350 Nonrheumatic aortic (valve) stenosis: Secondary | ICD-10-CM | POA: Insufficient documentation

## 2024-03-13 DIAGNOSIS — H5212 Myopia, left eye: Secondary | ICD-10-CM | POA: Diagnosis not present

## 2024-03-13 DIAGNOSIS — Z7984 Long term (current) use of oral hypoglycemic drugs: Secondary | ICD-10-CM | POA: Insufficient documentation

## 2024-03-13 DIAGNOSIS — Z87891 Personal history of nicotine dependence: Secondary | ICD-10-CM | POA: Diagnosis not present

## 2024-03-13 DIAGNOSIS — H2512 Age-related nuclear cataract, left eye: Secondary | ICD-10-CM | POA: Diagnosis not present

## 2024-03-13 DIAGNOSIS — I110 Hypertensive heart disease with heart failure: Secondary | ICD-10-CM | POA: Insufficient documentation

## 2024-03-13 DIAGNOSIS — I5032 Chronic diastolic (congestive) heart failure: Secondary | ICD-10-CM | POA: Diagnosis not present

## 2024-03-13 DIAGNOSIS — E1136 Type 2 diabetes mellitus with diabetic cataract: Secondary | ICD-10-CM | POA: Diagnosis not present

## 2024-03-13 HISTORY — DX: Nonrheumatic aortic (valve) stenosis: I35.0

## 2024-03-13 HISTORY — DX: Other ill-defined heart diseases: I51.89

## 2024-03-13 HISTORY — DX: Nonrheumatic mitral (valve) insufficiency: I34.0

## 2024-03-13 LAB — GLUCOSE, CAPILLARY: Glucose-Capillary: 119 mg/dL — ABNORMAL HIGH (ref 70–99)

## 2024-03-13 SURGERY — PHACOEMULSIFICATION, CATARACT, WITH IOL INSERTION
Anesthesia: Monitor Anesthesia Care | Site: Eye | Laterality: Left

## 2024-03-13 MED ORDER — SIGHTPATH DOSE#1 NA HYALUR & NA CHOND-NA HYALUR IO KIT
PACK | INTRAOCULAR | Status: DC | PRN
  Administered 2024-03-13: 1 via OPHTHALMIC

## 2024-03-13 MED ORDER — ARMC OPHTHALMIC DILATING DROPS
1.0000 | OPHTHALMIC | Status: DC | PRN
  Administered 2024-03-13 (×3): 1 via OPHTHALMIC

## 2024-03-13 MED ORDER — TETRACAINE HCL 0.5 % OP SOLN
OPHTHALMIC | Status: AC
  Filled 2024-03-13: qty 4

## 2024-03-13 MED ORDER — ONDANSETRON HCL 4 MG/2ML IJ SOLN
INTRAMUSCULAR | Status: AC
  Filled 2024-03-13: qty 2

## 2024-03-13 MED ORDER — ARMC OPHTHALMIC DILATING DROPS
OPHTHALMIC | Status: AC
Start: 2024-03-13 — End: ?
  Filled 2024-03-13: qty 0.5

## 2024-03-13 MED ORDER — LIDOCAINE HCL (PF) 2 % IJ SOLN
INTRAOCULAR | Status: DC | PRN
  Administered 2024-03-13: 4 mL via INTRAOCULAR

## 2024-03-13 MED ORDER — MIDAZOLAM HCL 2 MG/2ML IJ SOLN
INTRAMUSCULAR | Status: DC | PRN
  Administered 2024-03-13: .5 mg via INTRAVENOUS

## 2024-03-13 MED ORDER — MOXIFLOXACIN HCL 0.5 % OP SOLN
OPHTHALMIC | Status: DC | PRN
  Administered 2024-03-13: .2 mL via OPHTHALMIC

## 2024-03-13 MED ORDER — MIDAZOLAM HCL 2 MG/2ML IJ SOLN
INTRAMUSCULAR | Status: AC
  Filled 2024-03-13: qty 2

## 2024-03-13 MED ORDER — ONDANSETRON HCL 4 MG/2ML IJ SOLN
4.0000 mg | Freq: Once | INTRAMUSCULAR | Status: AC
Start: 2024-03-13 — End: 2024-03-13
  Administered 2024-03-13: 4 mg via INTRAVENOUS

## 2024-03-13 MED ORDER — SIGHTPATH DOSE#1 BSS IO SOLN
INTRAOCULAR | Status: DC | PRN
  Administered 2024-03-13: 117 mL via OPHTHALMIC

## 2024-03-13 MED ORDER — SIGHTPATH DOSE#1 BSS IO SOLN
INTRAOCULAR | Status: DC | PRN
  Administered 2024-03-13: 15 mL via INTRAOCULAR

## 2024-03-13 MED ORDER — TETRACAINE HCL 0.5 % OP SOLN
1.0000 [drp] | OPHTHALMIC | Status: DC | PRN
  Administered 2024-03-13 (×3): 1 [drp] via OPHTHALMIC

## 2024-03-13 SURGICAL SUPPLY — 13 items
CATARACT SUITE SIGHTPATH (MISCELLANEOUS) ×1 IMPLANT
DISSECTOR HYDRO NUCLEUS 50X22 (MISCELLANEOUS) ×1 IMPLANT
FEE CATARACT SUITE SIGHTPATH (MISCELLANEOUS) ×1 IMPLANT
GLOVE PI ULTRA LF STRL 7.5 (GLOVE) ×1 IMPLANT
GLOVE SURG POLYISOPRENE 8.5 (GLOVE) ×1 IMPLANT
GLOVE SURG PROTEXIS BL SZ6.5 (GLOVE) ×1 IMPLANT
GLOVE SURG SYN 6.5 PF PI BL (GLOVE) ×1 IMPLANT
GLOVE SURG SYN 8.5 PF PI BL (GLOVE) ×1 IMPLANT
LENS IOL CLRN 24.0 (Intraocular Lens) IMPLANT
LENS IOL MONOFOCAL CLAREON 24. (Intraocular Lens) ×1 IMPLANT
NDL FILTER BLUNT 18X1 1/2 (NEEDLE) ×1 IMPLANT
NEEDLE FILTER BLUNT 18X1 1/2 (NEEDLE) ×1 IMPLANT
SYR 3ML LL SCALE MARK (SYRINGE) ×1 IMPLANT

## 2024-03-13 NOTE — H&P (Signed)
 [facility]   Primary Care Physician:  Tasia Farr, FNP Ophthalmologist: Dr. Dusty Gin  Pre-Procedure History & Physical: HPI:  [name] is a 89 y.o. female here for cataract surgery.   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   a.) CT chest 03/09/2023: fusiform dilatation measuring 4.3 cm   Basal cell carcinoma (BCC) of left medial cheek    a.) s/p Mohs surgery 07/13/2022 at [facility]   Bilateral carotid artery disease [facility])    a.) doppler 04/03/2007: 50-75% RICA; b.) s/p LEFT CEA with CorMatrix patch angioplasty 01/18/2015; c.) doppler 11/02/2016: 40-59% RICA; d.) doppler 01/10/2018: 1-39% RICA   Diverticulosis 08/05/2015   Eczema of both upper extremities 06/10/2021   Grade I diastolic dysfunction    Heart failure with preserved left ventricular function (HFpEF) (HCC)    a.) TTE 05/17/2017: EF >55%, mild LVH, mild LAE, mild panval regurg; b.) TTE 06/27/2018 : EF >55%, mod LVH, mild LAE, mild panval regurg'; G1dd; c.) TTE 12/07/2019: EF >55%, mild LVH, mild MAC, mild panval regurg; d.) TTE 01/27/2021: EF >55%, mod LVH, triv TR, mild AR/MR, G1DD   Heart murmur    History of right cataract extraction    Hyperlipidemia    Hypertension    Insomnia    a.) on BZO (temazepam ) PRN   Irritable bowel syndrome with diarrhea 04/20/2017   LBP (low back pain) 08/05/2015   Melanoma (HCC)    a.) s/p Mohs surgery 02/06/2020 - LEFT medial brow --> V to Y advancement flap   Melena    Mild mitral regurgitation by prior  echocardiogram    Moderate aortic stenosis by prior echocardiogram    Non-small cell lung cancer (NSCLC) (HCC) 03/09/2023   a.) CT chest 03/09/2023 --> superior segment LLL --> 2.1 x 1.7 x 2.1 cm mixed ground-glass and subpleural nodule extending to pleura with irreg margins --> concerning for primary bronchogenic carcinoma rather than metastatic disease; b.) 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 (60 Gy over 6 fractions)   OAB (overactive bladder)    PONV (postoperative nausea and vomiting)    a.) single episode following cataract surgery   PONV (postoperative nausea and vomiting)    PSVT (paroxysmal supraventricular tachycardia) (HCC) 02/22/2015   a.) holter 02/22/2015: 6 runs with longest lasting 10 beats   Pulmonary embolism (HCC) 11/2023   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   a.) MRI brain 04/02/2007 - subacute RIGHT pontine CVA   Stroke (HCC) 12/16/2014   a.) MRI brain 12/16/2014: punctate foci of restricted diffusion x 5 consistent with microembolic infarctions affecting the RIGHT lateral medulla, LEFT temporal lobe, 2 foci in the RIGHT thalamus, and LEFT inferior  frontal lobe/basal ganglia --> distribution would suggest cardiac or ascending aortic source   T2DM (type 2 diabetes mellitus) (HCC)    Tubular adenoma of colon    Vertigo 07/2023    Past Surgical History:  Procedure Laterality Date   APPENDECTOMY     BREAST BIOPSY Right  neg   BREAST SURGERY     1980's   CAROTID ENDARTERECTOMY Left 01/18/2015   Procedure: CAROTID ENDARTERECTOMY; Location: [facility]; Surgeon: Devon Fogo, MD   CATARACT EXTRACTION Right 2012   CHOLECYSTECTOMY  06/27/2012   COLONOSCOPY N/A 10/05/2023   Procedure: COLONOSCOPY;  Surgeon: Toledo, Alphonsus Jeans, MD;  Location: [facility] ENDOSCOPY;  Service: Gastroenterology;  Laterality: N/A;   COLONOSCOPY WITH PROPOFOL  N/A 02/17/2023   Procedure: COLONOSCOPY WITH PROPOFOL ;  Surgeon: Shane Darling, MD;  Location: [facility] ENDOSCOPY;  Service: Endoscopy;   Laterality: N/A;   DECOMPRESSIVE LUMBAR LAMINECTOMY LEVEL 1  1950   ESOPHAGEAL BRUSHING  10/03/2023   Procedure: ESOPHAGEAL BRUSHING;  Surgeon: Corky Diener, Alphonsus Jeans, MD;  Location: [facility] ENDOSCOPY;  Service: Gastroenterology;;   ESOPHAGOGASTRODUODENOSCOPY N/A 10/03/2023   Procedure: ESOPHAGOGASTRODUODENOSCOPY (EGD);  Surgeon: Toledo, Alphonsus Jeans, MD;  Location: [facility] ENDOSCOPY;  Service: Gastroenterology;  Laterality: N/A;   ESOPHAGOGASTRODUODENOSCOPY (EGD) WITH PROPOFOL  N/A 02/16/2023   Procedure: ESOPHAGOGASTRODUODENOSCOPY (EGD) WITH PROPOFOL ;  Surgeon: Shane Darling, MD;  Location: [facility] ENDOSCOPY;  Service: Endoscopy;  Laterality: N/A;   IVC FILTER INSERTION N/A 12/08/2023   Procedure: IVC FILTER INSERTION;  Surgeon: Jackquelyn Mass, MD;  Location: [facility] INVASIVE CV LAB;  Service: Cardiovascular;  Laterality: N/A;   MELANOMA EXCISION Left 02/06/2020   Procedure: MOHS SURGERY WITH V TO Y ADVANCEMENT FLAP (LEFT MEDIAL EYEBROW); Location: [facility]   MOHS SURGERY Left 07/13/2022   Procedure: MOHS SURGERY (BCC LEFT MEDIAL CHEEK); Location: [facility]   PULMONARY THROMBECTOMY Bilateral 12/08/2023   Procedure: PULMONARY THROMBECTOMY;  Surgeon: Jackquelyn Mass, MD;  Location: [facility] INVASIVE CV LAB;  Service: Cardiovascular;  Laterality: Bilateral;    Prior to Admission medications   Medication Sig Start Date End Date Taking? Authorizing Provider  atorvastatin  (LIPITOR) 40 MG tablet Take 1 tablet (40 mg total) by mouth daily. 07/13/23  Yes Normie Becton, FNP  glipiZIDE  (GLUCOTROL  XL) 5 MG 24 hr tablet Take 1 tablet (5 mg total) by mouth daily with breakfast. 08/26/23  Yes Normie Becton, FNP  MECLIZINE  HCL PO Take by mouth as needed.   Yes [provider]  Omega-3 Fatty Acids (FISH OIL) 300 MG CAPS Take by mouth.   Yes [provider]  PARoxetine  (PAXIL ) 10 MG tablet Take 1 tablet (10 mg total) by mouth daily. 01/26/24  Yes Clifton, Kellie A, FNP  Polyethylene Glycol 3350  (MIRALAX PO) Take  by mouth.   Yes [provider]  temazepam  (RESTORIL ) 15 MG capsule TAKE 1 CAPSULE BY MOUTH AT BEDTIME AS NEEDED FOR SLEEP 02/21/24  Yes Ostwalt, Janna, PA-C  magic mouthwash SOLN Take 5 mLs by mouth 4 (four) times daily. Suspension contains equal amounts of Maalox Extra Strength, nystatin , and diphenhydramine . Patient not taking: Reported on 02/21/2024    [provider]  simethicone  (MYLICON) 125 MG chewable tablet Chew 1 tablet (125 mg total) by mouth every 6 (six) hours as needed for flatulence. Patient not taking: Reported on 12/22/2023 11/02/23   Iona Manis T, FNP    Allergies as of 02/28/2024 - Review Complete 02/21/2024  Allergen Reaction Noted   Codeine  08/05/2015   Nitrofurantoin  02/13/2020   Tramadol Nausea Only 08/05/2015   Doxycycline  Rash 10/18/2017   Penicillins Swelling, Rash, and Other (See Comments) 08/05/2015   Sulfa antibiotics Rash 08/05/2015    Family History  Problem Relation Age of Onset   Ovarian cancer Mother    Heart disease Brother    Heart attack Brother    Gout Brother  Alzheimer's disease Brother    Dementia Brother    Heart attack Brother    Diabetes Daughter    Melanoma Daughter    Stroke Son    Breast cancer Neg Hx     Social History   Socioeconomic History   Marital status: Widowed    Spouse name: Not on file   Number of children: 2   Years of education: Not on file   Highest education level: 12th grade  Occupational History   Occupation: retired  Tobacco Use   Smoking status: Former    Current packs/day: 0.00    Average packs/day: 0.5 packs/day for 6.0 years (3.0 ttl pk-yrs)    Types: Cigarettes    Start date: 12    Quit date: 11/15/1976    Years since quitting: 47.3   Smokeless tobacco: Never  Vaping Use   Vaping status: Never Used  Substance and Sexual Activity   Alcohol use: Never   Drug use: No   Sexual activity: Not Currently    Birth control/protection: Post-menopausal  Other Topics Concern   Not  on file  Social History Narrative   Not on file   Social Drivers of Health   Financial Resource Strain: Low Risk  (05/09/2020)   Overall Financial Resource Strain (CARDIA)    Difficulty of Paying Living Expenses: Not hard at all  Food Insecurity: No Food Insecurity (12/29/2023)   Hunger Vital Sign    Worried About Running Out of Food in the Last Year: Never true    Ran Out of Food in the Last Year: Never true  Transportation Needs: No Transportation Needs (12/29/2023)   PRAPARE - Administrator, Civil Service (Medical): No    Lack of Transportation (Non-Medical): No  Physical Activity: Inactive (05/09/2020)   Exercise Vital Sign    Days of Exercise per Week: 0 days    Minutes of Exercise per Session: 0 min  Stress: No Stress Concern Present (05/09/2020)   [employer] of Occupational Health - Occupational Stress Questionnaire    Feeling of Stress : Not at all  Social Connections: Moderately Integrated (12/23/2023)   Social Connection and Isolation Panel [NHANES]    Frequency of Communication with Friends and Family: More than three times a week    Frequency of Social Gatherings with Friends and Family: More than three times a week    Attends Religious Services: More than 4 times per year    Active Member of [employer] or Organizations: Yes    Attends Banker Meetings: 1 to 4 times per year    Marital Status: Widowed  Recent Concern: Social Connections - Moderately Isolated (12/08/2023)   Social Connection and Isolation Panel [NHANES]    Frequency of Communication with Friends and Family: Twice a week    Frequency of Social Gatherings with Friends and Family: Twice a week    Attends Religious Services: More than 4 times per year    Active Member of [employer] or Organizations: No    Attends Banker Meetings: Never    Marital Status: Widowed  Intimate Partner Violence: Not At Risk (12/29/2023)   Humiliation, Afraid, Rape, and Kick questionnaire    Fear  of Current or Ex-Partner: No    Emotionally Abused: No    Physically Abused: No    Sexually Abused: No    Review of Systems: See HPI, otherwise negative ROS  Physical Exam: BP (!) 156/72   Pulse 95   Temp (!) 97.5 F (36.4  C) (Temporal)   Resp 18   Ht 5\' 2"  (1.575 m)   Wt 51.3 kg   SpO2 95%   BMI 20.67 kg/m  General:   Alert, cooperative. Head:  Normocephalic and atraumatic. Respiratory:  Normal work of breathing. Cardiovascular:  NAD  Impression/Plan: [name] is here for cataract surgery.  Risks, benefits, limitations, and alternatives regarding cataract surgery have been reviewed with the patient.  Questions have been answered.  All parties agreeable.   Dusty Gin, MD  03/13/2024, 9:54 AM

## 2024-03-13 NOTE — Anesthesia Postprocedure Evaluation (Signed)
 Anesthesia Post Note  Patient: DAVY QUADRI  Procedure(s) Performed: PHACOEMULSIFICATION, CATARACT, WITH IOL  13.82 01:22.7 (Left: Eye)  Patient location during evaluation: PACU Anesthesia Type: MAC Level of consciousness: awake and alert Pain management: pain level controlled Vital Signs Assessment: post-procedure vital signs reviewed and stable Respiratory status: spontaneous breathing, nonlabored ventilation, respiratory function stable and patient connected to nasal cannula oxygen Cardiovascular status: stable and blood pressure returned to baseline Postop Assessment: no apparent nausea or vomiting Anesthetic complications: no   No notable events documented.   Last Vitals:  Vitals:   03/13/24 1024 03/13/24 1030  BP: 108/64 109/83  Pulse: 81 76  Resp: 12 (!) 21  Temp: (!) 36.4 C (!) 36.4 C  SpO2: 95% 97%    Last Pain:  Vitals:   03/13/24 1030  TempSrc:   PainSc: 2                  Dayrin Stallone C Brianca Fortenberry

## 2024-03-13 NOTE — Op Note (Signed)
 OPERATIVE NOTE  ROETTA BAUGHMAN [id number] 03/13/2024   PREOPERATIVE DIAGNOSIS:  Nuclear sclerotic cataract left eye.  H25.12   POSTOPERATIVE DIAGNOSIS:    Nuclear sclerotic cataract left eye.     PROCEDURE:  Phacoemusification with posterior chamber intraocular lens placement of the left eye   LENS:   Implant Name Type Inv. Item Serial No. Manufacturer Lot No. LRB No. Used Action  LENS IOL MONOFOCAL CLAREON 24. - [id number] Intraocular Lens LENS IOL MONOFOCAL CLAREON 24. [id number] [employer]  Left 1 Implanted      Procedure(s): PHACOEMULSIFICATION, CATARACT, WITH IOL  13.82 01:22.7 (Left)  SURGEON:  Dusty Gin, MD, MPH   ANESTHESIA:  Topical with tetracaine drops augmented with 1% preservative-free intracameral lidocaine .  ESTIMATED BLOOD LOSS: <1 mL   COMPLICATIONS:  None.   DESCRIPTION OF PROCEDURE:  The patient was identified in the holding room and transported to the operating room and placed in the supine position under the operating microscope.  The left eye was identified as the operative eye and it was prepped and draped in the usual sterile ophthalmic fashion.   A 1.0 millimeter clear-corneal paracentesis was made at the 5:00 position. 0.5 ml of preservative-free 1% lidocaine  with epinephrine was injected into the anterior chamber.  The anterior chamber was filled with viscoelastic.  A 2.4 millimeter keratome was used to make a near-clear corneal incision at the 2:00 position.  A curvilinear capsulorrhexis was made with a cystotome and capsulorrhexis forceps.  Balanced salt solution was used to hydrodissect and hydrodelineate the nucleus.   Phacoemulsification was then used in stop and chop fashion to remove the lens nucleus and epinucleus.  The remaining cortex was then removed using the irrigation and aspiration handpiece. Viscoelastic was then placed into the capsular bag to distend it for lens placement.  A lens was then injected into the capsular bag.  The remaining  viscoelastic was aspirated.   Wounds were hydrated with balanced salt solution.  The anterior chamber was inflated to a physiologic pressure with balanced salt solution.  Intracameral vigamox 0.1 mL undiltued was injected into the eye and a drop placed onto the ocular surface.  No wound leaks were noted.  The patient was taken to the recovery room in stable condition without complications of anesthesia or surgery  Dusty Gin 03/13/2024, 10:23 AM

## 2024-03-13 NOTE — Transfer of Care (Signed)
 Immediate Anesthesia Transfer of Care Note  Patient: [name]  Procedure(s) Performed: PHACOEMULSIFICATION, CATARACT, WITH IOL  13.82 01:22.7 (Left: Eye)  Patient Location: PACU  Anesthesia Type: MAC  Level of Consciousness: awake, alert  and patient cooperative  Airway and Oxygen Therapy: Patient Spontanous Breathing and Patient connected to supplemental oxygen  Post-op Assessment: Post-op Vital signs reviewed, Patient's Cardiovascular Status Stable, Respiratory Function Stable, Patent Airway and No signs of Nausea or vomiting  Post-op Vital Signs: Reviewed and stable  Complications: No notable events documented.

## 2024-03-14 ENCOUNTER — Encounter: Payer: Self-pay | Admitting: Ophthalmology

## 2024-03-16 DIAGNOSIS — K080 Exfoliation of teeth due to systemic causes: Secondary | ICD-10-CM | POA: Diagnosis not present

## 2024-03-21 DIAGNOSIS — I5032 Chronic diastolic (congestive) heart failure: Secondary | ICD-10-CM | POA: Diagnosis not present

## 2024-03-21 DIAGNOSIS — J189 Pneumonia, unspecified organism: Secondary | ICD-10-CM | POA: Diagnosis not present

## 2024-03-21 DIAGNOSIS — I6521 Occlusion and stenosis of right carotid artery: Secondary | ICD-10-CM | POA: Diagnosis not present

## 2024-03-21 DIAGNOSIS — K580 Irritable bowel syndrome with diarrhea: Secondary | ICD-10-CM | POA: Diagnosis not present

## 2024-03-21 DIAGNOSIS — I700 Atherosclerosis of aorta: Secondary | ICD-10-CM | POA: Diagnosis not present

## 2024-03-21 DIAGNOSIS — E785 Hyperlipidemia, unspecified: Secondary | ICD-10-CM | POA: Diagnosis not present

## 2024-03-21 DIAGNOSIS — F339 Major depressive disorder, recurrent, unspecified: Secondary | ICD-10-CM | POA: Diagnosis not present

## 2024-03-21 DIAGNOSIS — F5101 Primary insomnia: Secondary | ICD-10-CM | POA: Diagnosis not present

## 2024-03-21 DIAGNOSIS — I2699 Other pulmonary embolism without acute cor pulmonale: Secondary | ICD-10-CM | POA: Diagnosis not present

## 2024-03-21 DIAGNOSIS — I350 Nonrheumatic aortic (valve) stenosis: Secondary | ICD-10-CM | POA: Diagnosis not present

## 2024-03-21 DIAGNOSIS — G8929 Other chronic pain: Secondary | ICD-10-CM | POA: Diagnosis not present

## 2024-03-21 DIAGNOSIS — E1159 Type 2 diabetes mellitus with other circulatory complications: Secondary | ICD-10-CM | POA: Diagnosis not present

## 2024-03-21 DIAGNOSIS — I959 Hypotension, unspecified: Secondary | ICD-10-CM | POA: Diagnosis not present

## 2024-03-21 DIAGNOSIS — E1165 Type 2 diabetes mellitus with hyperglycemia: Secondary | ICD-10-CM | POA: Diagnosis not present

## 2024-03-21 DIAGNOSIS — Z48812 Encounter for surgical aftercare following surgery on the circulatory system: Secondary | ICD-10-CM | POA: Diagnosis not present

## 2024-03-21 DIAGNOSIS — K219 Gastro-esophageal reflux disease without esophagitis: Secondary | ICD-10-CM | POA: Diagnosis not present

## 2024-03-21 DIAGNOSIS — I471 Supraventricular tachycardia, unspecified: Secondary | ICD-10-CM | POA: Diagnosis not present

## 2024-03-21 DIAGNOSIS — M545 Low back pain, unspecified: Secondary | ICD-10-CM | POA: Diagnosis not present

## 2024-03-21 DIAGNOSIS — K922 Gastrointestinal hemorrhage, unspecified: Secondary | ICD-10-CM | POA: Diagnosis not present

## 2024-03-21 DIAGNOSIS — I809 Phlebitis and thrombophlebitis of unspecified site: Secondary | ICD-10-CM | POA: Diagnosis not present

## 2024-03-21 DIAGNOSIS — I152 Hypertension secondary to endocrine disorders: Secondary | ICD-10-CM | POA: Diagnosis not present

## 2024-03-21 DIAGNOSIS — E538 Deficiency of other specified B group vitamins: Secondary | ICD-10-CM | POA: Diagnosis not present

## 2024-03-21 DIAGNOSIS — K5904 Chronic idiopathic constipation: Secondary | ICD-10-CM | POA: Diagnosis not present

## 2024-03-21 DIAGNOSIS — D500 Iron deficiency anemia secondary to blood loss (chronic): Secondary | ICD-10-CM | POA: Diagnosis not present

## 2024-03-22 DIAGNOSIS — J189 Pneumonia, unspecified organism: Secondary | ICD-10-CM | POA: Diagnosis not present

## 2024-03-22 DIAGNOSIS — I959 Hypotension, unspecified: Secondary | ICD-10-CM | POA: Diagnosis not present

## 2024-03-22 DIAGNOSIS — I471 Supraventricular tachycardia, unspecified: Secondary | ICD-10-CM | POA: Diagnosis not present

## 2024-03-22 DIAGNOSIS — F339 Major depressive disorder, recurrent, unspecified: Secondary | ICD-10-CM | POA: Diagnosis not present

## 2024-03-22 DIAGNOSIS — K580 Irritable bowel syndrome with diarrhea: Secondary | ICD-10-CM | POA: Diagnosis not present

## 2024-03-22 DIAGNOSIS — F5101 Primary insomnia: Secondary | ICD-10-CM | POA: Diagnosis not present

## 2024-03-22 DIAGNOSIS — E1159 Type 2 diabetes mellitus with other circulatory complications: Secondary | ICD-10-CM | POA: Diagnosis not present

## 2024-03-22 DIAGNOSIS — M545 Low back pain, unspecified: Secondary | ICD-10-CM | POA: Diagnosis not present

## 2024-03-22 DIAGNOSIS — K922 Gastrointestinal hemorrhage, unspecified: Secondary | ICD-10-CM | POA: Diagnosis not present

## 2024-03-22 DIAGNOSIS — I809 Phlebitis and thrombophlebitis of unspecified site: Secondary | ICD-10-CM | POA: Diagnosis not present

## 2024-03-22 DIAGNOSIS — I152 Hypertension secondary to endocrine disorders: Secondary | ICD-10-CM | POA: Diagnosis not present

## 2024-03-22 DIAGNOSIS — E1165 Type 2 diabetes mellitus with hyperglycemia: Secondary | ICD-10-CM | POA: Diagnosis not present

## 2024-03-22 DIAGNOSIS — K219 Gastro-esophageal reflux disease without esophagitis: Secondary | ICD-10-CM | POA: Diagnosis not present

## 2024-03-22 DIAGNOSIS — E785 Hyperlipidemia, unspecified: Secondary | ICD-10-CM | POA: Diagnosis not present

## 2024-03-22 DIAGNOSIS — I700 Atherosclerosis of aorta: Secondary | ICD-10-CM | POA: Diagnosis not present

## 2024-03-22 DIAGNOSIS — I6521 Occlusion and stenosis of right carotid artery: Secondary | ICD-10-CM | POA: Diagnosis not present

## 2024-03-22 DIAGNOSIS — I2699 Other pulmonary embolism without acute cor pulmonale: Secondary | ICD-10-CM | POA: Diagnosis not present

## 2024-03-22 DIAGNOSIS — K5904 Chronic idiopathic constipation: Secondary | ICD-10-CM | POA: Diagnosis not present

## 2024-03-22 DIAGNOSIS — E538 Deficiency of other specified B group vitamins: Secondary | ICD-10-CM | POA: Diagnosis not present

## 2024-03-22 DIAGNOSIS — G8929 Other chronic pain: Secondary | ICD-10-CM | POA: Diagnosis not present

## 2024-03-22 DIAGNOSIS — D500 Iron deficiency anemia secondary to blood loss (chronic): Secondary | ICD-10-CM | POA: Diagnosis not present

## 2024-03-22 DIAGNOSIS — Z48812 Encounter for surgical aftercare following surgery on the circulatory system: Secondary | ICD-10-CM | POA: Diagnosis not present

## 2024-03-22 DIAGNOSIS — I350 Nonrheumatic aortic (valve) stenosis: Secondary | ICD-10-CM | POA: Diagnosis not present

## 2024-03-22 DIAGNOSIS — I5032 Chronic diastolic (congestive) heart failure: Secondary | ICD-10-CM | POA: Diagnosis not present

## 2024-03-28 DIAGNOSIS — D044 Carcinoma in situ of skin of scalp and neck: Secondary | ICD-10-CM | POA: Diagnosis not present

## 2024-03-29 DIAGNOSIS — K219 Gastro-esophageal reflux disease without esophagitis: Secondary | ICD-10-CM | POA: Diagnosis not present

## 2024-03-29 DIAGNOSIS — I350 Nonrheumatic aortic (valve) stenosis: Secondary | ICD-10-CM | POA: Diagnosis not present

## 2024-03-29 DIAGNOSIS — I152 Hypertension secondary to endocrine disorders: Secondary | ICD-10-CM | POA: Diagnosis not present

## 2024-03-29 DIAGNOSIS — I6521 Occlusion and stenosis of right carotid artery: Secondary | ICD-10-CM | POA: Diagnosis not present

## 2024-03-29 DIAGNOSIS — I700 Atherosclerosis of aorta: Secondary | ICD-10-CM | POA: Diagnosis not present

## 2024-03-29 DIAGNOSIS — J189 Pneumonia, unspecified organism: Secondary | ICD-10-CM | POA: Diagnosis not present

## 2024-03-29 DIAGNOSIS — I2699 Other pulmonary embolism without acute cor pulmonale: Secondary | ICD-10-CM | POA: Diagnosis not present

## 2024-03-29 DIAGNOSIS — E1165 Type 2 diabetes mellitus with hyperglycemia: Secondary | ICD-10-CM | POA: Diagnosis not present

## 2024-03-29 DIAGNOSIS — D500 Iron deficiency anemia secondary to blood loss (chronic): Secondary | ICD-10-CM | POA: Diagnosis not present

## 2024-03-29 DIAGNOSIS — E1159 Type 2 diabetes mellitus with other circulatory complications: Secondary | ICD-10-CM | POA: Diagnosis not present

## 2024-03-29 DIAGNOSIS — Z48812 Encounter for surgical aftercare following surgery on the circulatory system: Secondary | ICD-10-CM | POA: Diagnosis not present

## 2024-03-29 DIAGNOSIS — K580 Irritable bowel syndrome with diarrhea: Secondary | ICD-10-CM | POA: Diagnosis not present

## 2024-03-29 DIAGNOSIS — K5904 Chronic idiopathic constipation: Secondary | ICD-10-CM | POA: Diagnosis not present

## 2024-03-29 DIAGNOSIS — K922 Gastrointestinal hemorrhage, unspecified: Secondary | ICD-10-CM | POA: Diagnosis not present

## 2024-03-29 DIAGNOSIS — I809 Phlebitis and thrombophlebitis of unspecified site: Secondary | ICD-10-CM | POA: Diagnosis not present

## 2024-03-29 DIAGNOSIS — I5032 Chronic diastolic (congestive) heart failure: Secondary | ICD-10-CM | POA: Diagnosis not present

## 2024-03-29 DIAGNOSIS — F339 Major depressive disorder, recurrent, unspecified: Secondary | ICD-10-CM | POA: Diagnosis not present

## 2024-03-29 DIAGNOSIS — M545 Low back pain, unspecified: Secondary | ICD-10-CM | POA: Diagnosis not present

## 2024-03-29 DIAGNOSIS — I471 Supraventricular tachycardia, unspecified: Secondary | ICD-10-CM | POA: Diagnosis not present

## 2024-03-29 DIAGNOSIS — I959 Hypotension, unspecified: Secondary | ICD-10-CM | POA: Diagnosis not present

## 2024-03-29 DIAGNOSIS — F5101 Primary insomnia: Secondary | ICD-10-CM | POA: Diagnosis not present

## 2024-03-29 DIAGNOSIS — E785 Hyperlipidemia, unspecified: Secondary | ICD-10-CM | POA: Diagnosis not present

## 2024-03-29 DIAGNOSIS — G8929 Other chronic pain: Secondary | ICD-10-CM | POA: Diagnosis not present

## 2024-03-29 DIAGNOSIS — E538 Deficiency of other specified B group vitamins: Secondary | ICD-10-CM | POA: Diagnosis not present

## 2024-04-04 ENCOUNTER — Encounter (INDEPENDENT_AMBULATORY_CARE_PROVIDER_SITE_OTHER): Payer: Self-pay

## 2024-04-04 DIAGNOSIS — I6521 Occlusion and stenosis of right carotid artery: Secondary | ICD-10-CM | POA: Diagnosis not present

## 2024-04-04 DIAGNOSIS — K219 Gastro-esophageal reflux disease without esophagitis: Secondary | ICD-10-CM | POA: Diagnosis not present

## 2024-04-04 DIAGNOSIS — I471 Supraventricular tachycardia, unspecified: Secondary | ICD-10-CM | POA: Diagnosis not present

## 2024-04-04 DIAGNOSIS — I5032 Chronic diastolic (congestive) heart failure: Secondary | ICD-10-CM | POA: Diagnosis not present

## 2024-04-04 DIAGNOSIS — D500 Iron deficiency anemia secondary to blood loss (chronic): Secondary | ICD-10-CM | POA: Diagnosis not present

## 2024-04-04 DIAGNOSIS — G8929 Other chronic pain: Secondary | ICD-10-CM | POA: Diagnosis not present

## 2024-04-04 DIAGNOSIS — F5101 Primary insomnia: Secondary | ICD-10-CM | POA: Diagnosis not present

## 2024-04-04 DIAGNOSIS — F339 Major depressive disorder, recurrent, unspecified: Secondary | ICD-10-CM | POA: Diagnosis not present

## 2024-04-04 DIAGNOSIS — I2699 Other pulmonary embolism without acute cor pulmonale: Secondary | ICD-10-CM | POA: Diagnosis not present

## 2024-04-04 DIAGNOSIS — J189 Pneumonia, unspecified organism: Secondary | ICD-10-CM | POA: Diagnosis not present

## 2024-04-04 DIAGNOSIS — K580 Irritable bowel syndrome with diarrhea: Secondary | ICD-10-CM | POA: Diagnosis not present

## 2024-04-04 DIAGNOSIS — I350 Nonrheumatic aortic (valve) stenosis: Secondary | ICD-10-CM | POA: Diagnosis not present

## 2024-04-04 DIAGNOSIS — E1165 Type 2 diabetes mellitus with hyperglycemia: Secondary | ICD-10-CM | POA: Diagnosis not present

## 2024-04-04 DIAGNOSIS — K5904 Chronic idiopathic constipation: Secondary | ICD-10-CM | POA: Diagnosis not present

## 2024-04-04 DIAGNOSIS — E785 Hyperlipidemia, unspecified: Secondary | ICD-10-CM | POA: Diagnosis not present

## 2024-04-04 DIAGNOSIS — Z48812 Encounter for surgical aftercare following surgery on the circulatory system: Secondary | ICD-10-CM | POA: Diagnosis not present

## 2024-04-04 DIAGNOSIS — K922 Gastrointestinal hemorrhage, unspecified: Secondary | ICD-10-CM | POA: Diagnosis not present

## 2024-04-04 DIAGNOSIS — I959 Hypotension, unspecified: Secondary | ICD-10-CM | POA: Diagnosis not present

## 2024-04-04 DIAGNOSIS — I152 Hypertension secondary to endocrine disorders: Secondary | ICD-10-CM | POA: Diagnosis not present

## 2024-04-04 DIAGNOSIS — M545 Low back pain, unspecified: Secondary | ICD-10-CM | POA: Diagnosis not present

## 2024-04-04 DIAGNOSIS — E538 Deficiency of other specified B group vitamins: Secondary | ICD-10-CM | POA: Diagnosis not present

## 2024-04-04 DIAGNOSIS — I700 Atherosclerosis of aorta: Secondary | ICD-10-CM | POA: Diagnosis not present

## 2024-04-04 DIAGNOSIS — E1159 Type 2 diabetes mellitus with other circulatory complications: Secondary | ICD-10-CM | POA: Diagnosis not present

## 2024-04-04 DIAGNOSIS — I809 Phlebitis and thrombophlebitis of unspecified site: Secondary | ICD-10-CM | POA: Diagnosis not present

## 2024-04-06 DIAGNOSIS — I5032 Chronic diastolic (congestive) heart failure: Secondary | ICD-10-CM | POA: Diagnosis not present

## 2024-04-06 DIAGNOSIS — E1165 Type 2 diabetes mellitus with hyperglycemia: Secondary | ICD-10-CM | POA: Diagnosis not present

## 2024-04-06 DIAGNOSIS — I471 Supraventricular tachycardia, unspecified: Secondary | ICD-10-CM | POA: Diagnosis not present

## 2024-04-06 DIAGNOSIS — I959 Hypotension, unspecified: Secondary | ICD-10-CM | POA: Diagnosis not present

## 2024-04-06 DIAGNOSIS — D500 Iron deficiency anemia secondary to blood loss (chronic): Secondary | ICD-10-CM | POA: Diagnosis not present

## 2024-04-06 DIAGNOSIS — I809 Phlebitis and thrombophlebitis of unspecified site: Secondary | ICD-10-CM | POA: Diagnosis not present

## 2024-04-06 DIAGNOSIS — I350 Nonrheumatic aortic (valve) stenosis: Secondary | ICD-10-CM | POA: Diagnosis not present

## 2024-04-06 DIAGNOSIS — F5101 Primary insomnia: Secondary | ICD-10-CM | POA: Diagnosis not present

## 2024-04-06 DIAGNOSIS — J189 Pneumonia, unspecified organism: Secondary | ICD-10-CM | POA: Diagnosis not present

## 2024-04-06 DIAGNOSIS — I2699 Other pulmonary embolism without acute cor pulmonale: Secondary | ICD-10-CM | POA: Diagnosis not present

## 2024-04-06 DIAGNOSIS — E1159 Type 2 diabetes mellitus with other circulatory complications: Secondary | ICD-10-CM | POA: Diagnosis not present

## 2024-04-06 DIAGNOSIS — I700 Atherosclerosis of aorta: Secondary | ICD-10-CM | POA: Diagnosis not present

## 2024-04-06 DIAGNOSIS — K219 Gastro-esophageal reflux disease without esophagitis: Secondary | ICD-10-CM | POA: Diagnosis not present

## 2024-04-06 DIAGNOSIS — K5904 Chronic idiopathic constipation: Secondary | ICD-10-CM | POA: Diagnosis not present

## 2024-04-06 DIAGNOSIS — Z48812 Encounter for surgical aftercare following surgery on the circulatory system: Secondary | ICD-10-CM | POA: Diagnosis not present

## 2024-04-06 DIAGNOSIS — E538 Deficiency of other specified B group vitamins: Secondary | ICD-10-CM | POA: Diagnosis not present

## 2024-04-06 DIAGNOSIS — F339 Major depressive disorder, recurrent, unspecified: Secondary | ICD-10-CM | POA: Diagnosis not present

## 2024-04-06 DIAGNOSIS — M545 Low back pain, unspecified: Secondary | ICD-10-CM | POA: Diagnosis not present

## 2024-04-06 DIAGNOSIS — K922 Gastrointestinal hemorrhage, unspecified: Secondary | ICD-10-CM | POA: Diagnosis not present

## 2024-04-06 DIAGNOSIS — I152 Hypertension secondary to endocrine disorders: Secondary | ICD-10-CM | POA: Diagnosis not present

## 2024-04-06 DIAGNOSIS — E785 Hyperlipidemia, unspecified: Secondary | ICD-10-CM | POA: Diagnosis not present

## 2024-04-06 DIAGNOSIS — G8929 Other chronic pain: Secondary | ICD-10-CM | POA: Diagnosis not present

## 2024-04-06 DIAGNOSIS — I6521 Occlusion and stenosis of right carotid artery: Secondary | ICD-10-CM | POA: Diagnosis not present

## 2024-04-06 DIAGNOSIS — K580 Irritable bowel syndrome with diarrhea: Secondary | ICD-10-CM | POA: Diagnosis not present

## 2024-04-14 ENCOUNTER — Encounter: Payer: Self-pay | Admitting: Oncology

## 2024-04-14 DIAGNOSIS — K080 Exfoliation of teeth due to systemic causes: Secondary | ICD-10-CM | POA: Diagnosis not present

## 2024-04-25 DIAGNOSIS — R1013 Epigastric pain: Secondary | ICD-10-CM | POA: Diagnosis not present

## 2024-04-26 ENCOUNTER — Encounter: Payer: Self-pay | Admitting: Emergency Medicine

## 2024-04-26 ENCOUNTER — Other Ambulatory Visit: Payer: Self-pay

## 2024-04-26 DIAGNOSIS — Z9049 Acquired absence of other specified parts of digestive tract: Secondary | ICD-10-CM | POA: Diagnosis not present

## 2024-04-26 DIAGNOSIS — Z7901 Long term (current) use of anticoagulants: Secondary | ICD-10-CM | POA: Insufficient documentation

## 2024-04-26 DIAGNOSIS — R109 Unspecified abdominal pain: Secondary | ICD-10-CM | POA: Diagnosis not present

## 2024-04-26 DIAGNOSIS — K5900 Constipation, unspecified: Secondary | ICD-10-CM | POA: Insufficient documentation

## 2024-04-26 DIAGNOSIS — J189 Pneumonia, unspecified organism: Secondary | ICD-10-CM | POA: Diagnosis not present

## 2024-04-26 DIAGNOSIS — R1013 Epigastric pain: Secondary | ICD-10-CM | POA: Diagnosis not present

## 2024-04-26 DIAGNOSIS — N3289 Other specified disorders of bladder: Secondary | ICD-10-CM | POA: Diagnosis not present

## 2024-04-26 DIAGNOSIS — K5730 Diverticulosis of large intestine without perforation or abscess without bleeding: Secondary | ICD-10-CM | POA: Diagnosis not present

## 2024-04-26 LAB — CBC
HCT: 40.3 % (ref 36.0–46.0)
Hemoglobin: 12.8 g/dL (ref 12.0–15.0)
MCH: 27.8 pg (ref 26.0–34.0)
MCHC: 31.8 g/dL (ref 30.0–36.0)
MCV: 87.4 fL (ref 80.0–100.0)
Platelets: 190 10*3/uL (ref 150–400)
RBC: 4.61 MIL/uL (ref 3.87–5.11)
RDW: 16.4 % — ABNORMAL HIGH (ref 11.5–15.5)
WBC: 11.4 10*3/uL — ABNORMAL HIGH (ref 4.0–10.5)
nRBC: 0 % (ref 0.0–0.2)

## 2024-04-26 LAB — COMPREHENSIVE METABOLIC PANEL WITH GFR
ALT: 17 U/L (ref 0–44)
AST: 22 U/L (ref 15–41)
Albumin: 3.6 g/dL (ref 3.5–5.0)
Alkaline Phosphatase: 73 U/L (ref 38–126)
Anion gap: 10 (ref 5–15)
BUN: 19 mg/dL (ref 8–23)
CO2: 23 mmol/L (ref 22–32)
Calcium: 9.7 mg/dL (ref 8.9–10.3)
Chloride: 105 mmol/L (ref 98–111)
Creatinine, Ser: 0.73 mg/dL (ref 0.44–1.00)
GFR, Estimated: >60 mL/min (ref >60)
Glucose, Bld: 166 mg/dL — ABNORMAL HIGH (ref 70–99)
Potassium: 4.3 mmol/L (ref 3.5–5.1)
Sodium: 138 mmol/L (ref 135–145)
Total Bilirubin: 1 mg/dL (ref 0.0–1.2)
Total Protein: 7.4 g/dL (ref 6.5–8.1)

## 2024-04-26 LAB — LIPASE, BLOOD: Lipase: 33 U/L (ref 11–51)

## 2024-04-26 NOTE — ED Triage Notes (Signed)
 Pt reports she takes miralax for constipation.  Last small bowel movement was yesterday.  Pt reports taking 2 doses of miralax today and about 4 enemas without relief.  Pt saw physician yesterday and had plain abdominal x-ray but they reported she may need a CT scan.

## 2024-04-27 ENCOUNTER — Emergency Department
Admission: EM | Admit: 2024-04-27 | Discharge: 2024-04-27 | Disposition: A | Discharge status: Home / Self Care | Attending: Emergency Medicine | Admitting: Emergency Medicine

## 2024-04-27 ENCOUNTER — Emergency Department

## 2024-04-27 DIAGNOSIS — N3289 Other specified disorders of bladder: Secondary | ICD-10-CM | POA: Diagnosis not present

## 2024-04-27 DIAGNOSIS — K5900 Constipation, unspecified: Secondary | ICD-10-CM

## 2024-04-27 DIAGNOSIS — J189 Pneumonia, unspecified organism: Secondary | ICD-10-CM

## 2024-04-27 DIAGNOSIS — Z9049 Acquired absence of other specified parts of digestive tract: Secondary | ICD-10-CM | POA: Diagnosis not present

## 2024-04-27 DIAGNOSIS — K5730 Diverticulosis of large intestine without perforation or abscess without bleeding: Secondary | ICD-10-CM | POA: Diagnosis not present

## 2024-04-27 DIAGNOSIS — R109 Unspecified abdominal pain: Secondary | ICD-10-CM | POA: Diagnosis not present

## 2024-04-27 LAB — CBG MONITORING, ED: Glucose-Capillary: 136 mg/dL — ABNORMAL HIGH (ref 70–99)

## 2024-04-27 LAB — TROPONIN I (HIGH SENSITIVITY): Troponin I (High Sensitivity): 12 ng/L (ref <18)

## 2024-04-27 MED ORDER — FLEET ENEMA RE ENEM
1.0000 | ENEMA | Freq: Once | RECTAL | Status: AC
  Administered 2024-04-27: 1 via RECTAL

## 2024-04-27 MED ORDER — POLYETHYLENE GLYCOL 3350 17 G PO PACK
34.0000 g | PACK | Freq: Every day | ORAL | Status: DC
  Administered 2024-04-27: 34 g via ORAL
  Filled 2024-04-27: qty 2

## 2024-04-27 MED ORDER — LACTATED RINGERS IV BOLUS
1000.0000 mL | Freq: Once | INTRAVENOUS | Status: AC
  Administered 2024-04-27: 1000 mL via INTRAVENOUS

## 2024-04-27 MED ORDER — MINERAL OIL RE ENEM: 1.0000 | ENEMA | Freq: Once | RECTAL | Status: DC

## 2024-04-27 MED ORDER — POLYETHYLENE GLYCOL 3350 17 G PO PACK: 34.0000 g | PACK | Freq: Every day | ORAL | Status: DC

## 2024-04-27 MED ORDER — DOXYCYCLINE HYCLATE 100 MG PO TABS: 100.0000 mg | ORAL_TABLET | Freq: Two times a day (BID) | ORAL | 0 refills | Status: AC

## 2024-04-27 MED ORDER — DOXYCYCLINE HYCLATE 100 MG PO TABS
100.0000 mg | ORAL_TABLET | Freq: Once | ORAL | Status: AC
  Administered 2024-04-27: 100 mg via ORAL
  Filled 2024-04-27: qty 1

## 2024-04-27 NOTE — ED Provider Notes (Signed)
 [facility] Provider Note    Event Date/Time   First MD Initiated Contact with Patient 04/27/24 0014     (approximate)   History   Constipation   HPI  [name] is a 89 y.o. female who presents to the ED for evaluation of Constipation   I review a general surgery clinic visit from 2 days ago.  Seen for epigastric pain. Review admission from February.  Admitted for GI bleeding in the setting of Eliquis  prescribed for recently diagnosed PE.  Underwent mechanical PE thrombectomy, IVC filter placement and Eliquis  was stopped during this admission for GI bleeding.  Patient presents to the ED for evaluation of 2 days of lack of stool.  Still passing gas, no emesis but reports constipation despite her normal once nightly MiraLAX dosing.  She presents with her daughters who also reports that she has been more fatigued over the past week and patient does reluctantly acknowledge malaise and fatigue with exertion but no cough or shortness of breath.  No chest pain.   Physical Exam   Triage Vital Signs: ED Triage Vitals  Encounter Vitals Group     BP 04/26/24 2142 120/68     Systolic BP Percentile --      Diastolic BP Percentile --      Pulse Rate 04/26/24 2142 80     Resp 04/26/24 2142 18     Temp 04/26/24 2142 97.7 F (36.5 C)     Temp Source 04/26/24 2142 Oral     SpO2 04/26/24 2142 100 %     Weight --      Height --      Head Circumference --      Peak Flow --      Pain Score 04/26/24 2058 4     Pain Loc --      Pain Education --      Exclude from Growth Chart --     Most recent vital signs: Vitals:   04/26/24 2142 04/27/24 0255  BP: 120/68 132/69  Pulse: 80 73  Resp: 18 16  Temp: 97.7 F (36.5 C) 98.2 F (36.8 C)  SpO2: 100% 97%    General: Awake, no distress.  CV:  Good peripheral perfusion.  Resp:  Normal effort.  Abd:  No distention.  Soft without significant tenderness, guarding or peritoneal features MSK:  No deformity  noted.  Neuro:  No focal deficits appreciated. Other:     ED Results / Procedures / Treatments   Labs (all labs ordered are listed, but only abnormal results are displayed) Labs Reviewed  COMPREHENSIVE METABOLIC PANEL WITH GFR - Abnormal; Notable for the following components:      Result Value   Glucose, Bld 166 (*)    All other components within normal limits  CBC - Abnormal; Notable for the following components:   WBC 11.4 (*)    RDW 16.4 (*)    All other components within normal limits  CBG MONITORING, ED - Abnormal; Notable for the following components:   Glucose-Capillary 136 (*)    All other components within normal limits  LIPASE, BLOOD  URINALYSIS, ROUTINE W REFLEX MICROSCOPIC  TROPONIN I (HIGH SENSITIVITY)    EKG   RADIOLOGY CT abdomen/pelvis interpreted by me with right lower lobe pneumonia but no acute intra-abdominal pathology  Official radiology report(s): CT ABDOMEN PELVIS WO CONTRAST Result Date: 04/27/2024 CLINICAL DATA:  Acute abdominal pain EXAM: CT ABDOMEN AND PELVIS WITHOUT CONTRAST TECHNIQUE: Multidetector CT imaging of  the abdomen and pelvis was performed following the standard protocol without IV contrast. RADIATION DOSE REDUCTION: This exam was performed according to the departmental dose-optimization program which includes automated exposure control, adjustment of the mA and/or kV according to patient size and/or use of iterative reconstruction technique. COMPARISON:  02/03/2024 FINDINGS: Lower chest: Linear scarring is noted in the left lower lobe. Areas of consolidation are noted in the lateral aspect of the right lower lobe with air bronchograms. These of increased in the interval from the prior exam consistent with developing pneumonia. No sizable effusion is seen. Hepatobiliary: No focal liver abnormality is seen. Status post cholecystectomy. No biliary dilatation. Pancreas: Unremarkable. No pancreatic ductal dilatation or surrounding inflammatory  changes. Spleen: Normal in size without focal abnormality. Adrenals/Urinary Tract: Adrenal glands are within normal limits. Kidneys demonstrate no renal calculi or obstructive changes. The ureters are within normal limits. Bladder is partially distended. Stomach/Bowel: Scattered diverticular changes are noted within the sigmoid colon. No diverticulitis is seen. Postsurgical changes in the proximal transverse colon are seen. The appendix has been surgically removed. Small bowel and stomach are within normal limits. Vascular/Lymphatic: Aortic atherosclerosis. No enlarged abdominal or pelvic lymph nodes. IVC filter is noted in satisfactory position. Reproductive: Uterus and bilateral adnexa are unremarkable. Other: No abdominal wall hernia or abnormality. No abdominopelvic ascites. Musculoskeletal: No acute or significant osseous findings. IMPRESSION: Diverticulosis without diverticulitis. Progressive pneumonia in the right lower lobe when compared with the prior exam. Electronically Signed   By: Violeta Grey M.D.   On: 04/27/2024 00:46    PROCEDURES and INTERVENTIONS:  Procedures  Medications  polyethylene glycol (MIRALAX / GLYCOLAX) packet 34 g (34 g Oral Given 04/27/24 0225)  lactated ringers  bolus 1,000 mL (0 mLs Intravenous Stopped 04/27/24 0320)  doxycycline  (VIBRA -TABS) tablet 100 mg (100 mg Oral Given 04/27/24 0225)  sodium phosphate  (FLEET) enema 1 enema (1 enema Rectal Given 04/27/24 0354)     IMPRESSION / MDM / ASSESSMENT AND PLAN / ED COURSE  I reviewed the triage vital signs and the nursing notes.  Differential diagnosis includes, but is not limited to, SBO, dehydration, ACS  {Patient presents with symptoms of an acute illness or injury that is potentially life-threatening.  Patient presents with 2 days of constipation found to have evidence of community-acquired pneumonia suitable for trial of outpatient management.  No significant abdominal tenderness.  Essentially normal exam.  She  does pass stool after being provided IV fluids, MiraLAX and an enema.  CT without signs of SBO but does demonstrate a right basilar pneumonia.  With further questioning while she denies a cough and shortness of breath, some exertional dyspnea and fatigue.  Add on troponins that are negative.  Start the patient on antibiotics.    I considered admission for this patient and offered that to her but she declines preferring to go home with family.  She has capacity.  Discussed close return precautions  Clinical Course as of 04/27/24 0515  Thu Apr 27, 2024  0508 Reassessed.  Patient requesting discharge and wanting to go home.  She has passed some stool reports feeling a little bit better.  We discussed antibiotics, OTC medications for constipation, expectant management, ED return precautions. [DS]    Clinical Course User Index [DS] Arline Bennett, MD     FINAL CLINICAL IMPRESSION(S) / ED DIAGNOSES   Final diagnoses:  Constipation, unspecified constipation type  Community acquired pneumonia of right lower lobe of lung     Rx / DC Orders   ED  Discharge Orders          Ordered    doxycycline  (VIBRA -TABS) 100 MG tablet  2 times daily        04/27/24 0502             Note:  This document was prepared using Dragon voice recognition software and may include unintentional dictation errors.   Arline Bennett, MD 04/27/24 [phone]

## 2024-04-27 NOTE — ED Notes (Signed)
 Pt starting to quiver while this nurse attempted to medicate her. Stated that she hasn't had anything to eat all day and is a type 2 diabetic. FSBS obtained: 136. Warm blankets also provided per pt request. Family remains present at bedside. Stretcher in lowest position with wheels locked. Side rail up x 1. Call light within reach.

## 2024-04-27 NOTE — Discharge Instructions (Signed)
 Doxycycline  antibiotics twice daily for 1 week due to signs of pneumonia.   Continue to take Miralax as needed for your constipation. Can take more than you've been using - 1-2 capfuls per dose, 1-2 times per day.   Return to the ED with any worsening symptoms.

## 2024-05-09 DIAGNOSIS — K080 Exfoliation of teeth due to systemic causes: Secondary | ICD-10-CM | POA: Diagnosis not present

## 2024-05-17 DIAGNOSIS — J188 Other pneumonia, unspecified organism: Secondary | ICD-10-CM | POA: Diagnosis not present

## 2024-05-17 DIAGNOSIS — R5381 Other malaise: Secondary | ICD-10-CM | POA: Diagnosis not present

## 2024-05-17 DIAGNOSIS — K5790 Diverticulosis of intestine, part unspecified, without perforation or abscess without bleeding: Secondary | ICD-10-CM | POA: Diagnosis not present

## 2024-05-17 DIAGNOSIS — Z8701 Personal history of pneumonia (recurrent): Secondary | ICD-10-CM | POA: Diagnosis not present

## 2024-05-19 ENCOUNTER — Encounter: Payer: Self-pay | Admitting: Family Medicine

## 2024-05-22 ENCOUNTER — Inpatient Hospital Stay: Attending: Oncology

## 2024-05-22 ENCOUNTER — Other Ambulatory Visit: Payer: Self-pay

## 2024-05-22 DIAGNOSIS — K5791 Diverticulosis of intestine, part unspecified, without perforation or abscess with bleeding: Secondary | ICD-10-CM | POA: Insufficient documentation

## 2024-05-22 DIAGNOSIS — Z85038 Personal history of other malignant neoplasm of large intestine: Secondary | ICD-10-CM | POA: Insufficient documentation

## 2024-05-22 DIAGNOSIS — D500 Iron deficiency anemia secondary to blood loss (chronic): Secondary | ICD-10-CM | POA: Insufficient documentation

## 2024-05-22 DIAGNOSIS — R911 Solitary pulmonary nodule: Secondary | ICD-10-CM | POA: Diagnosis not present

## 2024-05-22 DIAGNOSIS — I2699 Other pulmonary embolism without acute cor pulmonale: Secondary | ICD-10-CM | POA: Diagnosis not present

## 2024-05-22 DIAGNOSIS — Z08 Encounter for follow-up examination after completed treatment for malignant neoplasm: Secondary | ICD-10-CM | POA: Insufficient documentation

## 2024-05-22 DIAGNOSIS — F5101 Primary insomnia: Secondary | ICD-10-CM

## 2024-05-22 DIAGNOSIS — D509 Iron deficiency anemia, unspecified: Secondary | ICD-10-CM

## 2024-05-22 LAB — CBC WITH DIFFERENTIAL/PLATELET
Abs Immature Granulocytes: 0.02 K/uL (ref 0.00–0.07)
Basophils Absolute: 0 K/uL (ref 0.0–0.1)
Basophils Relative: 0 %
Eosinophils Absolute: 0.1 K/uL (ref 0.0–0.5)
Eosinophils Relative: 2 %
HCT: 38.4 % (ref 36.0–46.0)
Hemoglobin: 12.1 g/dL (ref 12.0–15.0)
Immature Granulocytes: 0 %
Lymphocytes Relative: 29 %
Lymphs Abs: 1.8 K/uL (ref 0.7–4.0)
MCH: 27.7 pg (ref 26.0–34.0)
MCHC: 31.5 g/dL (ref 30.0–36.0)
MCV: 87.9 fL (ref 80.0–100.0)
Monocytes Absolute: 0.4 K/uL (ref 0.1–1.0)
Monocytes Relative: 7 %
Neutro Abs: 4 K/uL (ref 1.7–7.7)
Neutrophils Relative %: 62 %
Platelets: 145 K/uL — ABNORMAL LOW (ref 150–400)
RBC: 4.37 MIL/uL (ref 3.87–5.11)
RDW: 15.3 % (ref 11.5–15.5)
WBC: 6.5 K/uL (ref 4.0–10.5)
nRBC: 0 % (ref 0.0–0.2)

## 2024-05-22 LAB — IRON AND TIBC
Iron: 45 ug/dL (ref 28–170)
Saturation Ratios: 14 % (ref 10.4–31.8)
TIBC: 319 ug/dL (ref 250–450)
UIBC: 274 ug/dL

## 2024-05-22 LAB — FERRITIN: Ferritin: 32 ng/mL (ref 11–307)

## 2024-05-22 LAB — SAMPLE TO BLOOD BANK

## 2024-05-22 NOTE — Telephone Encounter (Signed)
 LOV 02/24/24 NOV 06/26/24 LRF 02/21/24 LABS 05/22/24

## 2024-05-23 ENCOUNTER — Ambulatory Visit: Admitting: Oncology

## 2024-05-23 ENCOUNTER — Ambulatory Visit

## 2024-05-23 MED ORDER — TEMAZEPAM 15 MG PO CAPS: ORAL_CAPSULE | ORAL | 0 refills | Status: DC

## 2024-05-24 ENCOUNTER — Encounter: Payer: Self-pay | Admitting: Oncology

## 2024-05-24 ENCOUNTER — Inpatient Hospital Stay (HOSPITAL_BASED_OUTPATIENT_CLINIC_OR_DEPARTMENT_OTHER): Admitting: Oncology

## 2024-05-24 ENCOUNTER — Inpatient Hospital Stay

## 2024-05-24 VITALS — BP 99/72 | HR 83 | Temp 96.4°F | Resp 16 | Ht 62.0 in | Wt 116.8 lb

## 2024-05-24 DIAGNOSIS — I2699 Other pulmonary embolism without acute cor pulmonale: Secondary | ICD-10-CM | POA: Diagnosis not present

## 2024-05-24 DIAGNOSIS — R911 Solitary pulmonary nodule: Secondary | ICD-10-CM

## 2024-05-24 DIAGNOSIS — Z08 Encounter for follow-up examination after completed treatment for malignant neoplasm: Secondary | ICD-10-CM | POA: Diagnosis not present

## 2024-05-24 DIAGNOSIS — K5791 Diverticulosis of intestine, part unspecified, without perforation or abscess with bleeding: Secondary | ICD-10-CM | POA: Diagnosis not present

## 2024-05-24 DIAGNOSIS — D509 Iron deficiency anemia, unspecified: Secondary | ICD-10-CM | POA: Diagnosis not present

## 2024-05-24 DIAGNOSIS — Z85038 Personal history of other malignant neoplasm of large intestine: Secondary | ICD-10-CM | POA: Diagnosis not present

## 2024-05-24 DIAGNOSIS — D500 Iron deficiency anemia secondary to blood loss (chronic): Secondary | ICD-10-CM | POA: Diagnosis not present

## 2024-05-24 NOTE — Progress Notes (Signed)
 No Venofer  today per MD note

## 2024-05-24 NOTE — Progress Notes (Signed)
 [facility]  [phone] Fax:([phone]) [phone]  ID: [name] Fire OB: 1928/12/28  MR#: [id number]  RDW#:[id number]  Patient Care Team: Wellington Curtis LABOR, FNP as PCP - General (Family Medicine) Myrna Adine Anes, MD as Consulting Physician (Ophthalmology) Dasher, Alm LABOR, MD (Dermatology) Jacobo Evalene PARAS, MD as Consulting Physician (Oncology)  CHIEF COMPLAINT: History of stage I colon cancer, history left lower lobe lung nodule presumed malignant, iron  deficiency anemia secondary to diverticular bleed.  INTERVAL HISTORY: Patient returns to clinic today for repeat laboratory work, further evaluation, and consideration of additional IV Venofer .  She was recently seen in the emergency room for increasing abdominal pain, but imaging did not reveal any significant pathology.  He currently feels well.  She has no neurologic complaints.  She denies any recent fevers or illnesses.  She has a good appetite and denies weight loss.  She has no chest pain, shortness of breath, cough, or hemoptysis.  She denies any nausea, vomiting, constipation, or diarrhea.  She has no melena or hematochezia.  She has no urinary complaints.  Patient offers no further specific complaints today.  REVIEW OF SYSTEMS:   Review of Systems  Constitutional: Negative.  Negative for fever, malaise/fatigue and weight loss.  Respiratory: Negative.  Negative for cough, hemoptysis and shortness of breath.   Cardiovascular: Negative.  Negative for chest pain and leg swelling.  Gastrointestinal: Negative.  Negative for abdominal pain, blood in stool and melena.  Genitourinary: Negative.  Negative for hematuria.  Musculoskeletal: Negative.  Negative for back pain.  Skin: Negative.  Negative for rash.  Neurological: Negative.  Negative for dizziness, focal weakness, weakness and headaches.  Psychiatric/Behavioral: Negative.  Negative for depression. The patient is not nervous/anxious and does not have  insomnia.     As per HPI. Otherwise, a complete review of systems is negative.  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   a.) CT chest 03/09/2023: fusiform dilatation measuring 4.3 cm   Basal cell carcinoma (BCC) of left medial cheek    a.) s/p Mohs surgery 07/13/2022 at [facility]   Bilateral carotid artery disease [facility])    a.) doppler 04/03/2007: 50-75% RICA; b.) s/p LEFT CEA with CorMatrix patch angioplasty 01/18/2015; c.) doppler 11/02/2016: 40-59% RICA; d.) doppler 01/10/2018: 1-39% RICA   Diverticulosis 08/05/2015   Eczema of both upper extremities 06/10/2021   Grade I diastolic dysfunction    Heart failure with preserved left ventricular function (HFpEF) (HCC)    a.) TTE 05/17/2017: EF >55%, mild LVH, mild LAE, mild panval regurg; b.) TTE 06/27/2018 : EF >55%, mod LVH, mild LAE, mild panval regurg'; G1dd; c.) TTE 12/07/2019: EF >55%, mild LVH, mild MAC, mild panval regurg; d.) TTE 01/27/2021: EF >55%, mod LVH, triv TR, mild AR/MR, G1DD   Heart murmur    History of right cataract extraction    Hyperlipidemia    Hypertension    Insomnia    a.) on BZO (temazepam ) PRN   Irritable bowel syndrome with diarrhea  04/20/2017   LBP (low back pain) 08/05/2015   Melanoma (HCC)    a.) s/p Mohs surgery 02/06/2020 - LEFT medial brow --> V to Y advancement flap   Melena    Mild mitral regurgitation by prior echocardiogram    Moderate aortic stenosis by prior echocardiogram    Non-small cell lung cancer (NSCLC) (HCC)  03/09/2023   a.) CT chest 03/09/2023 --> superior segment LLL --> 2.1 x 1.7 x 2.1 cm mixed ground-glass and subpleural nodule extending to pleura with irreg margins --> concerning for primary bronchogenic carcinoma rather than metastatic disease; b.) 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 (60 Gy over 6 fractions)   OAB (overactive bladder)    PONV (postoperative nausea and vomiting)    a.) single episode following cataract surgery   PONV (postoperative nausea and vomiting)    PSVT (paroxysmal supraventricular tachycardia) (HCC) 02/22/2015   a.) holter 02/22/2015: 6 runs with longest lasting 10 beats   Pulmonary embolism (HCC) 11/2023   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   a.) MRI brain 04/02/2007 - subacute RIGHT pontine CVA   Stroke (HCC) 12/16/2014   a.) MRI brain 12/16/2014: punctate foci of restricted diffusion x 5 consistent with microembolic infarctions affecting the RIGHT lateral medulla, LEFT temporal lobe, 2 foci in the RIGHT thalamus, and LEFT inferior  frontal lobe/basal ganglia --> distribution would suggest cardiac or ascending aortic source   T2DM (type 2 diabetes mellitus) (HCC)    Tubular adenoma of colon    Vertigo 07/2023    PAST SURGICAL HISTORY: Past Surgical History:  Procedure Laterality Date   APPENDECTOMY     BREAST BIOPSY Right    neg   BREAST SURGERY     1980's   CAROTID ENDARTERECTOMY Left 01/18/2015   Procedure: CAROTID ENDARTERECTOMY; Location: [facility]; Surgeon: Cordella Shawl, MD   CATARACT EXTRACTION Right 2012   CATARACT EXTRACTION W/PHACO Left 03/13/2024   Procedure: PHACOEMULSIFICATION, CATARACT, WITH IOL  13.82 01:22.7;  Surgeon: Myrna Adine Anes, MD;  Location: [facility];  Service: Ophthalmology;  Laterality: Left;   CHOLECYSTECTOMY  06/27/2012   COLONOSCOPY N/A 10/05/2023   Procedure: COLONOSCOPY;  Surgeon: Toledo, Ladell POUR, MD;  Location: [facility] END[facility]COPY;  Service: Gastroenterology;  Laterality: N/A;   COLONOSCOPY WITH PROPOFOL   N/A 02/17/2023   Procedure: COLONOSCOPY WITH PROPOFOL ;  Surgeon: Maryruth Ole DASEN, MD;  Location: [facility] END[facility]COPY;  Service: Endoscopy;  Laterality: N/A;   DECOMPRESSIVE LUMBAR LAMINECTOMY LEVEL 1  1950   ESOPHAGEAL BRUSHING  10/03/2023   Procedure: ESOPHAGEAL BRUSHING;  Surgeon: Aundria, Ladell POUR, MD;  Location: [facility] END[facility];  Service: Gastroenterology;;   ESOPHAGOGASTRODUODENOSCOPY N/A 10/03/2023   Procedure: ESOPHAGOGASTRODUODENOSCOPY (EGD);  Surgeon: Toledo, Ladell POUR, MD;  Location: [facility] END[facility];  Service: Gastroenterology;  Laterality: N/A;   ESOPHAGOGASTRODUODENOSCOPY (EGD) WITH PROPOFOL  N/A 02/16/2023   Procedure: ESOPHAGOGASTRODUODENOSCOPY (EGD) WITH PROPOFOL ;  Surgeon: Maryruth Ole DASEN, MD;  Location: [facility] END[facility];  Service: Endoscopy;  Laterality: N/A;   IVC FILTER INSERTION N/A 12/08/2023   Procedure: IVC FILTER INSERTION;  Surgeon: Shawl Cordella MATSU, MD;  Location: [facility] INVASIVE CV [facility];  Service: Cardiovascular;  Laterality: N/A;   MELANOMA EXCISION Left 02/06/2020   Procedure: MOHS SURGERY WITH V TO Y ADVANCEMENT FLAP (LEFT MEDIAL EYEBROW); Location: [facility]   MOHS SURGERY Left 07/13/2022   Procedure: MOHS SURGERY (BCC LEFT MEDIAL CHEEK); Location: [facility]   PULMONARY THROMBECTOMY Bilateral 12/08/2023   Procedure: PULMONARY THROMBECTOMY;  Surgeon: Shawl Cordella MATSU, MD;  Location: [facility] INVASIVE CV LAB;  Service: Cardiovascular;  Laterality: Bilateral;    FAMILY HISTORY: Family History  Problem Relation Age of Onset   Ovarian cancer Mother    Heart disease Brother    Heart attack Brother    Gout Brother    Alzheimer's disease Brother    Dementia Brother    Heart attack Brother    Diabetes Daughter    Melanoma Daughter    Stroke Son    Breast cancer Neg Hx     ADVANCED DIRECTIVES (Y/N):  N  HEALTH MAINTENANCE: Social History   Tobacco Use   Smoking status: Former    Current packs/day: 0.00    Average packs/day: 0.5 packs/day for 6.0 years (3.0 ttl pk-yrs)     Types: Cigarettes    Start date: 71    Quit date: 11/15/1976    Years since quitting: 47.5   Smokeless tobacco: Never  Vaping Use   Vaping status: Never Used  Substance Use Topics   Alcohol use: Never   Drug use: No     Colonoscopy:  PAP:  Bone density:  Lipid panel:  Allergies  Allergen Reactions   Codeine Nausea Only   Eliquis  [Apixaban ] Other (See Comments)    GI Bleed   Nitrofurantoin Nausea Only   Tramadol Nausea Only   Doxycycline  Nausea Only and Rash   Penicillins Swelling, Rash and Other (See Comments)    Throat swelling    Sulfa Antibiotics Nausea Only and Rash    Current Outpatient Medications  Medication Sig Dispense Refill   atorvastatin  (LIPITOR) 40 MG tablet Take 1 tablet (40 mg total) by mouth daily. 90 tablet 3   glipiZIDE  (GLUCOTROL  XL) 5 MG 24 hr tablet Take 1 tablet (5 mg total) by mouth daily with breakfast. 90 tablet 3   MECLIZINE  HCL PO Take by mouth as needed.     Omega-3 Fatty Acids (FISH OIL) 300 MG CAPS Take by mouth.     Polyethylene Glycol 3350  (MIRALAX  PO) Take by mouth.     temazepam  (RESTORIL ) 15 MG capsule TAKE 1 CAPSULE BY MOUTH AT BEDTIME AS NEEDED FOR SLEEP 90 capsule 0   magic mouthwash SOLN Take 5 mLs by mouth 4 (four) times daily. Suspension contains equal amounts of Maalox Extra Strength, nystatin , and diphenhydramine . (Patient not taking: Reported on 05/24/2024)     PARoxetine  (PAXIL ) 10 MG tablet Take 1 tablet (10 mg total) by mouth daily. (Patient not taking: Reported on 05/24/2024) 90 tablet 1   simethicone  (MYLICON) 125 MG chewable tablet Chew 1 tablet (125 mg total) by mouth every 6 (six) hours as needed for flatulence. (Patient not taking: Reported on 05/24/2024) 120 tablet 0   No current facility-administered medications for this visit.    OBJECTIVE: Vitals:   05/24/24 1009  BP: 99/72  Pulse: 83  Resp: 16  Temp: (!) 96.4 F (35.8 C)  SpO2: 98%     Body mass index is 21.36 kg/m.    ECOG FS:0 - Asymptomatic  General:  Well-developed, well-nourished, no acute distress. Eyes: Pink conjunctiva, anicteric sclera. HEENT: Normocephalic, moist mucous membranes. Lungs: No audible wheezing or coughing. Heart: Regular rate and rhythm. Abdomen: Soft, nontender, no obvious distention. Musculoskeletal: No edema, cyanosis, or clubbing. Neuro: Alert, answering all questions appropriately. Cranial nerves grossly intact. Skin: No rashes or petechiae noted. Psych: Normal affect.  LAB RESULTS:  Lab Results  Component Value Date   NA 138 04/26/2024   K 4.3 04/26/2024   CL 105 04/26/2024   CO2 23 04/26/2024   GLUCOSE 166 (H)  04/26/2024   BUN 19 04/26/2024   CREATININE 0.73 04/26/2024   CALCIUM  9.7 04/26/2024   PROT 7.4 04/26/2024   ALBUMIN 3.6 04/26/2024   AST 22 04/26/2024   ALT 17 04/26/2024   ALKPHOS 73 04/26/2024   BILITOT 1.0 04/26/2024   GFRNONAA >60 04/26/2024   GFRAA 82 05/14/2020    Lab Results  Component Value Date   WBC 6.5 05/22/2024   NEUTROABS 4.0 05/22/2024   HGB 12.1 05/22/2024   HCT 38.4 05/22/2024   MCV 87.9 05/22/2024   PLT 145 (L) 05/22/2024   Lab Results  Component Value Date   IRON  45 05/22/2024   TIBC 319 05/22/2024   IRONPCTSAT 14 05/22/2024   Lab Results  Component Value Date   FERRITIN 32 05/22/2024     STUDIES: CT ABDOMEN PELVIS WO CONTRAST Result Date: 04/27/2024 CLINICAL DATA:  Acute abdominal pain EXAM: CT ABDOMEN AND PELVIS WITHOUT CONTRAST TECHNIQUE: Multidetector CT imaging of the abdomen and pelvis was performed following the standard protocol without IV contrast. RADIATION DOSE REDUCTION: This exam was performed according to the departmental dose-optimization program which includes automated exposure control, adjustment of the mA and/or kV according to patient size and/or use of iterative reconstruction technique. COMPARISON:  02/03/2024 FINDINGS: Lower chest: Linear scarring is noted in the left lower lobe. Areas of consolidation are noted in the lateral  aspect of the right lower lobe with air bronchograms. These of increased in the interval from the prior exam consistent with developing pneumonia. No sizable effusion is seen. Hepatobiliary: No focal liver abnormality is seen. Status post cholecystectomy. No biliary dilatation. Pancreas: Unremarkable. No pancreatic ductal dilatation or surrounding inflammatory changes. Spleen: Normal in size without focal abnormality. Adrenals/Urinary Tract: Adrenal glands are within normal limits. Kidneys demonstrate no renal calculi or obstructive changes. The ureters are within normal limits. Bladder is partially distended. Stomach/Bowel: Scattered diverticular changes are noted within the sigmoid colon. No diverticulitis is seen. Postsurgical changes in the proximal transverse colon are seen. The appendix has been surgically removed. Small bowel and stomach are within normal limits. Vascular/Lymphatic: Aortic atherosclerosis. No enlarged abdominal or pelvic lymph nodes. IVC filter is noted in satisfactory position. Reproductive: Uterus and bilateral adnexa are unremarkable. Other: No abdominal wall hernia or abnormality. No abdominopelvic ascites. Musculoskeletal: No acute or significant osseous findings. IMPRESSION: Diverticulosis without diverticulitis. Progressive pneumonia in the right lower lobe when compared with the prior exam. Electronically Signed   By: Oneil Devonshire M.D.   On: 04/27/2024 00:46    ASSESSMENT: History of stage I colon cancer, history left lower lobe lung nodule presumed malignant, iron  deficiency anemia secondary to diverticular bleed.  PLAN:    Iron  deficiency anemia: Resolved.  Secondary to diverticular bleed.  Patient's hemoglobin and iron  stores continue to be within normal limits.  She does not require she does not require additional IV Venofer  today.  Patient last received treatment on November 04, 2023.  Return to clinic in 3 months with repeat laboratory work and further evaluation at which  point she likely can be transition to evaluation every 6 months.   Diverticular bleed: Resolved.  Eliquis  has been discontinued.  Continue follow-up with GI as indicated.   Pulmonary embolism: CT scan results from November 23, 2023 reviewed independently with incidental right sided pulmonary embolism.  Repeat CT scan on December 07, 2023 revealed worsening of clot burden.  Patient has IVC filter placed.  Eliquis  has been discontinued given history of significant GI bleed. Stage I colon cancer: Patient underwent  surgical resection on June 21, 2023.  0/13 lymph nodes did not reveal evidence of malignancy.  Given the stage of disease, she did not require any further adjuvant treatment.  Her most recent colonoscopy as above showed no evidence of malignancy. Left lower lobe lung nodule: No biopsy was obtained, but nodule was approximately 2.2 cm and significantly hypermetabolic on PET scan from Apr 01, 2023 therefore presumed malignant.  She underwent XRT completing in July 2024.  CT of the chest without contrast on February 03, 2024 revealed no evidence of recurrent or progressive disease.   Repeat CT scan in 3 months with follow-up 1 week later. Anxiety/coping/insomnia: Patient was given a previously referral to [facility]. Abdominal pain: Resolved.  CT scan on April 27, 2024 only revealed diverticulosis.   Patient expressed understanding and was in agreement with this plan. She also understands that She can call clinic at any time with any questions, concerns, or complaints.    Cancer Staging  Colon cancer [facility]) Staging form: Colon and Rectum, AJCC 8th Edition - Pathologic stage from 06/21/2023: Stage I (pT2, pN0, cM0) - Signed by Jacobo Evalene PARAS, MD on 10/19/2023 Stage prefix: Initial diagnosis Total positive nodes: 0   Evalene PARAS Jacobo, MD   05/24/2024 10:34 AM

## 2024-05-29 DIAGNOSIS — K080 Exfoliation of teeth due to systemic causes: Secondary | ICD-10-CM | POA: Diagnosis not present

## 2024-06-08 DIAGNOSIS — H60543 Acute eczematoid otitis externa, bilateral: Secondary | ICD-10-CM | POA: Diagnosis not present

## 2024-06-08 DIAGNOSIS — J300 Vasomotor rhinitis: Secondary | ICD-10-CM | POA: Diagnosis not present

## 2024-06-08 DIAGNOSIS — H903 Sensorineural hearing loss, bilateral: Secondary | ICD-10-CM | POA: Diagnosis not present

## 2024-06-08 DIAGNOSIS — H6123 Impacted cerumen, bilateral: Secondary | ICD-10-CM | POA: Diagnosis not present

## 2024-06-19 ENCOUNTER — Other Ambulatory Visit: Payer: Self-pay | Admitting: Family Medicine

## 2024-06-19 DIAGNOSIS — F5101 Primary insomnia: Secondary | ICD-10-CM

## 2024-06-19 MED ORDER — TEMAZEPAM 15 MG PO CAPS: ORAL_CAPSULE | ORAL | 0 refills | Status: DC

## 2024-06-20 NOTE — Telephone Encounter (Signed)
 Filled

## 2024-06-26 ENCOUNTER — Encounter: Payer: Self-pay | Admitting: Family Medicine

## 2024-06-26 ENCOUNTER — Ambulatory Visit
Admission: RE | Admit: 2024-06-26 | Discharge: 2024-06-26 | Disposition: A | Discharge status: Home / Self Care | Source: Ambulatory Visit | Attending: Family Medicine

## 2024-06-26 ENCOUNTER — Ambulatory Visit
Admission: RE | Admit: 2024-06-26 | Discharge: 2024-06-26 | Disposition: A | Discharge status: Home / Self Care | Attending: Family Medicine | Admitting: Family Medicine

## 2024-06-26 ENCOUNTER — Ambulatory Visit: Payer: Self-pay | Admitting: Family Medicine

## 2024-06-26 VITALS — BP 129/85 | HR 89 | Ht 62.0 in | Wt 119.6 lb

## 2024-06-26 DIAGNOSIS — R0789 Other chest pain: Secondary | ICD-10-CM | POA: Insufficient documentation

## 2024-06-26 DIAGNOSIS — I152 Hypertension secondary to endocrine disorders: Secondary | ICD-10-CM | POA: Diagnosis not present

## 2024-06-26 DIAGNOSIS — N9489 Other specified conditions associated with female genital organs and menstrual cycle: Secondary | ICD-10-CM | POA: Diagnosis not present

## 2024-06-26 DIAGNOSIS — E785 Hyperlipidemia, unspecified: Secondary | ICD-10-CM

## 2024-06-26 DIAGNOSIS — E1165 Type 2 diabetes mellitus with hyperglycemia: Secondary | ICD-10-CM | POA: Diagnosis not present

## 2024-06-26 DIAGNOSIS — Z7984 Long term (current) use of oral hypoglycemic drugs: Secondary | ICD-10-CM

## 2024-06-26 DIAGNOSIS — Z0000 Encounter for general adult medical examination without abnormal findings: Secondary | ICD-10-CM | POA: Diagnosis not present

## 2024-06-26 DIAGNOSIS — E1159 Type 2 diabetes mellitus with other circulatory complications: Secondary | ICD-10-CM | POA: Diagnosis not present

## 2024-06-26 DIAGNOSIS — Z043 Encounter for examination and observation following other accident: Secondary | ICD-10-CM | POA: Diagnosis not present

## 2024-06-26 DIAGNOSIS — R42 Dizziness and giddiness: Secondary | ICD-10-CM | POA: Diagnosis not present

## 2024-06-26 DIAGNOSIS — R55 Syncope and collapse: Secondary | ICD-10-CM

## 2024-06-26 DIAGNOSIS — E1169 Type 2 diabetes mellitus with other specified complication: Secondary | ICD-10-CM | POA: Diagnosis not present

## 2024-06-26 DIAGNOSIS — L304 Erythema intertrigo: Secondary | ICD-10-CM

## 2024-06-26 DIAGNOSIS — R079 Chest pain, unspecified: Secondary | ICD-10-CM | POA: Diagnosis not present

## 2024-06-26 DIAGNOSIS — R918 Other nonspecific abnormal finding of lung field: Secondary | ICD-10-CM | POA: Diagnosis not present

## 2024-06-26 DIAGNOSIS — S2020XA Contusion of thorax, unspecified, initial encounter: Secondary | ICD-10-CM | POA: Diagnosis not present

## 2024-06-26 MED ORDER — ATORVASTATIN CALCIUM 40 MG PO TABS: 40.0000 mg | ORAL_TABLET | Freq: Every day | ORAL | 3 refills | Status: AC

## 2024-06-26 MED ORDER — GLIPIZIDE ER 5 MG PO TB24: 5.0000 mg | ORAL_TABLET | Freq: Every day | ORAL | 3 refills | Status: AC

## 2024-06-26 MED ORDER — NYSTATIN 100000 UNIT/GM EX CREA: 1.0000 | TOPICAL_CREAM | Freq: Two times a day (BID) | CUTANEOUS | 0 refills | Status: DC

## 2024-06-26 NOTE — Progress Notes (Signed)
 Established Patient Office Visit  Subjective   Patient ID: [name], female    DOB: 02-14-1929  Age: 89 y.o. MRN: [id number]  Chief Complaint  Patient presents with   Annual Exam    Last AWV completed 07/13/23 Diet -  General Exercise - walks around patio at times but not too much due tot the heat Feeling - well  Sleeping - fairly well Concerns -  check bruising from where she fell about a week and a half ago. Possible syncope as patient does not remember hitting anything. Pt was not seen after fall    Discussed the use of AI scribe software for clinical note transcription with the patient, who gave verbal consent to proceed.  History of Present Illness [name] is a 89 year old female who presents with persistent groin itching and burning  She has been experiencing persistent itching and burning in the vaginal/groin area for approximately a month, which she associates with the completion of a course of antibiotics. The itching is severe, with temporary relief from Desitin and another cream recommended by a friend.  About a week and a half ago, she experienced a fall resulting in bruising and soreness on her side. The fall occurred after losing control and passing out while trying to pick up a cat. She did not seek emergency care but has been experiencing soreness and reports that it is painful to take a deep breath since the incident. Denies injury to head or awareness of hitting head. She has a history of multiple hospitalizations for pneumonia, which she has had four or five times in the past year.  She mentions a history of skin changes, including a spot on her leg that was 'black and crusty' but has since been scratched off. She has a history of sun exposure and has had multiple skin lesions removed in the past. She is scheduled to see a dermatologist next month.  Her current medications include glimepiride, atorvastatin , temazepam , and vitamin D . She uses Miralax   nightly, reduced to a quarter cap with juice to manage bowel movements. She has discontinued fish oil and meclizine . She wears compression stockings for blood flow and has an IVC filter in place due to previous blood clots.       06/26/2024    1:42 PM 05/24/2024   10:22 AM 11/25/2023    2:08 PM  Depression screen PHQ 2/9  Decreased Interest 0 0 2  Down, Depressed, Hopeless 0 0 1  PHQ - 2 Score 0 0 3  Altered sleeping 3  3  Tired, decreased energy 1  3  Change in appetite 0  2  Feeling bad or failure about yourself  0  2  Trouble concentrating 1  2  Moving slowly or fidgety/restless 1  2  Suicidal thoughts 0  0  PHQ-9 Score 6  17  Difficult doing work/chores Somewhat difficult  Very difficult       06/26/2024    1:42 PM 11/25/2023    2:09 PM  GAD 7 : Generalized Anxiety Score  Nervous, Anxious, on Edge 0 0  Control/stop worrying 1 2  Worry too much - different things 1 2  Trouble relaxing 0 3  Restless 0 1  Easily annoyed or irritable 0 1  Afraid - awful might happen 0 0  Total GAD 7 Score 2 9  Anxiety Difficulty Somewhat difficult Very difficult     ROS  Negative unless indicated in HPI   Objective:  BP 129/85 (BP Location: Right Arm, Patient Position: Sitting, Cuff Size: Normal)   Pulse 89   Ht 5' 2 (1.575 m)   Wt 119 lb 9.6 oz (54.3 kg)   SpO2 99%   BMI 21.88 kg/m    Physical Exam Constitutional:      General: She is not in acute distress.    Appearance: Normal appearance. She is normal weight. She is not ill-appearing.  HENT:     Head: Normocephalic.     Right Ear: Tympanic membrane normal.     Left Ear: Tympanic membrane normal.     Nose: Nose normal.     Mouth/Throat:     Mouth: Mucous membranes are moist.     Pharynx: Oropharynx is clear. No oropharyngeal exudate or posterior oropharyngeal erythema.  Eyes:     General: Lids are normal.     Extraocular Movements: Extraocular movements intact.     Right eye: Normal extraocular motion.     Left  eye: Normal extraocular motion.     Conjunctiva/sclera: Conjunctivae normal.     Right eye: Right conjunctiva is not injected.     Left eye: Left conjunctiva is not injected.     Pupils: Pupils are equal, round, and reactive to light.  Neck:     Thyroid : No thyroid  mass, thyromegaly or thyroid  tenderness.     Vascular: No carotid bruit.  Cardiovascular:     Rate and Rhythm: Normal rate and regular rhythm.     Pulses: Normal pulses.          Radial pulses are 2+ on the right side and 2+ on the left side.       Posterior tibial pulses are 2+ on the right side and 2+ on the left side.     Heart sounds: Normal heart sounds, S1 normal and S2 normal. No murmur heard.    No friction rub. No gallop.  Pulmonary:     Effort: Pulmonary effort is normal. No respiratory distress.     Breath sounds: Normal breath sounds. No stridor. No wheezing, rhonchi or rales.  Chest:     Chest wall: Tenderness present.     Comments: Tenderness, bruising, green to yellow in color. bilateral breasts from fall 1.5 weeks ago Abdominal:     General: Bowel sounds are normal. There is no distension.     Palpations: Abdomen is soft. There is no mass.     Tenderness: There is no abdominal tenderness. There is no right CVA tenderness, left CVA tenderness, guarding or rebound.     Hernia: No hernia is present.  Musculoskeletal:        General: No swelling or tenderness. Normal range of motion.     Cervical back: Normal range of motion. No rigidity.  Lymphadenopathy:     Cervical: No cervical adenopathy.     Right cervical: No superficial, deep or posterior cervical adenopathy.    Left cervical: No superficial, deep or posterior cervical adenopathy.  Skin:    General: Skin is warm and dry.     Capillary Refill: Capillary refill takes less than 2 seconds.     Findings: Lesion present. No bruising or erythema.     Comments: Various nevis on face. Abnormal lesion, mid L calf medial, photo attached - states was black, and  itchy.  Neurological:     General: No focal deficit present.     Mental Status: She is alert and oriented to person, place, and time. Mental status is at baseline.  GCS: GCS eye subscore is 4. GCS verbal subscore is 5. GCS motor subscore is 6.     Cranial Nerves: No cranial nerve deficit.     Sensory: No sensory deficit.     Motor: No weakness, tremor or pronator drift.     Coordination: Romberg sign negative.     Gait: Gait is intact. Gait normal.  Psychiatric:        Attention and Perception: Attention and perception normal.        Mood and Affect: Mood and affect normal.        Speech: Speech normal.        Behavior: Behavior normal. Behavior is cooperative.        Thought Content: Thought content normal.        Cognition and Memory: Cognition and memory normal.        Judgment: Judgment normal.      Results for orders placed or performed in visit on 06/26/24  Basic metabolic panel with GFR  Result Value Ref Range   Glucose 278 (H) 70 - 99 mg/dL   BUN 14 10 - 36 mg/dL   Creatinine, Ser 9.15 0.57 - 1.00 mg/dL   eGFR 65 >40 fO/fpw/8.26   BUN/Creatinine Ratio 17 12 - 28   Sodium 141 134 - 144 mmol/L   Potassium 4.4 3.5 - 5.2 mmol/L   Chloride 105 96 - 106 mmol/L   CO2 22 20 - 29 mmol/L   Calcium  9.8 8.7 - 10.3 mg/dL  CBC  Result Value Ref Range   WBC 6.3 3.4 - 10.8 x10E3/uL   RBC 4.64 3.77 - 5.28 x10E6/uL   Hemoglobin 13.2 11.1 - 15.9 g/dL   Hematocrit 58.5 65.9 - 46.6 %   MCV 89 79 - 97 fL   MCH 28.4 26.6 - 33.0 pg   MCHC 31.9 31.5 - 35.7 g/dL   RDW 86.3 88.2 - 84.5 %   Platelets 132 (L) 150 - 450 x10E3/uL  Hemoglobin A1c  Result Value Ref Range   Hgb A1c MFr Bld 6.8 (H) 4.8 - 5.6 %   Est. average glucose Bld gHb Est-mCnc 148 mg/dL  TSH  Result Value Ref Range   TSH 1.690 0.450 - 4.500 uIU/mL  Lipid panel  Result Value Ref Range   Cholesterol, Total 152 100 - 199 mg/dL   Triglycerides 722 (H) 0 - 149 mg/dL   HDL 44 >60 mg/dL   VLDL Cholesterol Cal 44 (H)  5 - 40 mg/dL   LDL Chol Calc (NIH) 64 0 - 99 mg/dL   Chol/HDL Ratio 3.5 0.0 - 4.4 ratio  POCT Urinalysis Dipstick  Result Value Ref Range   Color, UA     Clarity, UA     Glucose, UA Negative Negative   Bilirubin, UA negative    Ketones, UA negative    Spec Grav, UA 1.015 1.010 - 1.025   Blood, UA negative    pH, UA 6.0 5.0 - 8.0   Protein, UA Negative Negative   Urobilinogen, UA 0.2 0.2 or 1.0 E.U./dL   Nitrite, UA negative    Leukocytes, UA Negative Negative   Appearance     Odor        The ASCVD Risk score (Arnett DK, et al., 2019) failed to calculate for the following reasons:   The 2019 ASCVD risk score is only valid for ages 67 to 52   Risk score cannot be calculated because patient has a medical history suggesting prior/existing ASCVD  Assessment & Plan:  Hypertension associated with diabetes (HCC) -     Basic metabolic panel with GFR  Hyperlipidemia associated with type 2 diabetes mellitus (HCC) -     Atorvastatin  Calcium ; Take 1 tablet (40 mg total) by mouth daily.  Dispense: 90 tablet; Refill: 3 -     Lipid panel  Type 2 diabetes mellitus with hyperglycemia, without long-term current use of insulin  (HCC) -     glipiZIDE  ER; Take 1 tablet (5 mg total) by mouth daily with breakfast.  Dispense: 90 tablet; Refill: 3 -     CBC -     Hemoglobin A1c  Costochondral chest pain -     DG Chest 2 View; Future  Intertrigo of genital labia -     Nystatin ; Apply 1 Application topically 2 (two) times daily. To affected area to groin  Dispense: 30 g; Refill: 0  Syncope, unspecified syncope type -     Basic metabolic panel with GFR -     CBC -     Hemoglobin A1c -     TSH -     LONG TERM MONITOR (3-14 DAYS)  Vaginal burning -     POCT urinalysis dipstick     Assessment and Plan Assessment & Plan Genital intertrigo Likely due to antibiotic use and postmenopausal status increasing susceptibility to yeast infections. Intertrigo due to moisture and sweat. -  Prescribed nystatin  cream twice daily to affected areas. - POC UA - negative - recommend vaginal swab if symptoms continue after application of nystatin  cream to affected groin areas of moisture and redness, may need oral diflucan  - Change incontinence pads more frequently - shower daily, keep genitals clean and dry - May use calmoseptine for irritation   Contusion of chest wall following recent fall Soreness and difficulty breathing post-fall. Concerns about potential rib fracture and pneumonia risk due to history. - Ordered chest x-ray to rule out fractures or complications. - Advised use of pillow to brace when coughing. 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 History of blood clots. - Advised wearing compression stockings to promote blood flow. - f/u with vascular surgery for continued mgmt  Insomnia Difficulty sleeping, possibly due to lack of daytime activity and irregular sleep schedule. - Encouraged regular sleep schedule. - Advised against daytime naps. - Discussed use of white noise machine or fan. - Risks discussed on temazepam  and age with daughter and patient - pt ware would like to still continue medication  Type 2 diabetes mellitus - continue glipizide  5mg , chronic use - will check A1c - goal less than 8% given age  Constipation, managed with daily Miralax  Current regimen effective in maintaining regular bowel movements without causing diarrhea. - Continue current Miralax  regimen with adjusted dose.  Hyperlipidemia - will check LP - Continue Lipitor 40mg  daily  HTN - lifestyle managed GOAL<140/80 for age  Vitamin D  deficiency, on supplementation Managed with daily supplementation. - Continue current vitamin D  supplementation.  Syncope - Likely pt got up too quickly when bending up to pick up cat, vasovagal response - Recommend chronic use  of Life alert to alert family if falls in future - Recommend pt visit ED in future immediately if happens again - Pt neuro intact, no signs of injury to head, healing bruising on bilateral chest/breasts - chest xray order to rule out any skeletal involvement. - Be aware of  rugs, cords, stairs, steps - Wear proper fitting footwear - order Zio patch - Will check, BMP, CBC, TSH  Physical Things to do to keep yourself healthy  - Exercise at least 30-45 minutes a day, 3-4 days a week.  - Eat a low-fat diet with lots of fruits and vegetables, up to 7-9 servings per day.  - Seatbelts can save your life. Wear them always.  - Smoke detectors on every level of your home, check batteries every year.  - Eye Doctor - have an eye exam every 1-2 years  - Safe sex - if you may be exposed to STDs, use a condom.  - Alcohol -  If you drink, do it moderately, less than 2 drinks per day.  - Health Care Power of Attorney. Choose someone to speak for you if you are not able.  - Depression is common in our stressful world.If you're feeling down or losing interest in things you normally enjoy, please come in for a visit.  - Violence - If anyone is threatening or hurting you, please call immediately.   Return in about 6 months (around 12/27/2024) for Chronic Disease.   I, Curtis DELENA Boom, FNP, have reviewed all documentation for this visit. The documentation on 06/27/24 for the exam, diagnosis, procedures, and orders are all accurate and complete.   Curtis DELENA Boom, FNP

## 2024-06-27 ENCOUNTER — Encounter: Payer: Self-pay | Admitting: Family Medicine

## 2024-06-27 ENCOUNTER — Ambulatory Visit: Payer: Self-pay | Admitting: Family Medicine

## 2024-06-27 LAB — BASIC METABOLIC PANEL WITH GFR
BUN/Creatinine Ratio: 17 (ref 12–28)
BUN: 14 mg/dL (ref 10–36)
CO2: 22 mmol/L (ref 20–29)
Calcium: 9.8 mg/dL (ref 8.7–10.3)
Chloride: 105 mmol/L (ref 96–106)
Creatinine, Ser: 0.84 mg/dL (ref 0.57–1.00)
Glucose: 278 mg/dL — ABNORMAL HIGH (ref 70–99)
Potassium: 4.4 mmol/L (ref 3.5–5.2)
Sodium: 141 mmol/L (ref 134–144)
eGFR: 65 mL/min/1.73 (ref >59)

## 2024-06-27 LAB — CBC
Hematocrit: 41.4 % (ref 34.0–46.6)
Hemoglobin: 13.2 g/dL (ref 11.1–15.9)
MCH: 28.4 pg (ref 26.6–33.0)
MCHC: 31.9 g/dL (ref 31.5–35.7)
MCV: 89 fL (ref 79–97)
Platelets: 132 x10E3/uL — ABNORMAL LOW (ref 150–450)
RBC: 4.64 x10E6/uL (ref 3.77–5.28)
RDW: 13.6 % (ref 11.7–15.4)
WBC: 6.3 x10E3/uL (ref 3.4–10.8)

## 2024-06-27 LAB — HEMOGLOBIN A1C
Est. average glucose Bld gHb Est-mCnc: 148 mg/dL
Hgb A1c MFr Bld: 6.8 % — ABNORMAL HIGH (ref 4.8–5.6)

## 2024-06-27 LAB — POCT URINALYSIS DIPSTICK
Bilirubin, UA: NEGATIVE
Blood, UA: NEGATIVE
Glucose, UA: NEGATIVE
Ketones, UA: NEGATIVE
Leukocytes, UA: NEGATIVE
Nitrite, UA: NEGATIVE
Protein, UA: NEGATIVE
Spec Grav, UA: 1.015 (ref 1.010–1.025)
Urobilinogen, UA: 0.2 U/dL
pH, UA: 6 (ref 5.0–8.0)

## 2024-06-27 LAB — TSH: TSH: 1.69 u[IU]/mL (ref 0.450–4.500)

## 2024-06-27 LAB — LIPID PANEL
Chol/HDL Ratio: 3.5 ratio (ref 0.0–4.4)
Cholesterol, Total: 152 mg/dL (ref 100–199)
HDL: 44 mg/dL (ref >39)
LDL Chol Calc (NIH): 64 mg/dL (ref 0–99)
Triglycerides: 277 mg/dL — ABNORMAL HIGH (ref 0–149)
VLDL Cholesterol Cal: 44 mg/dL — ABNORMAL HIGH (ref 5–40)

## 2024-06-27 NOTE — Telephone Encounter (Signed)
 Spoke with janet. Wanted clarification if pt was getting pill for her bottom, per kellie not only the nystatin  cream was given aside from her daily medicaiton. Clarita verbalized understanding and will inform her mother in law (pt) regarding the confusion.

## 2024-06-28 ENCOUNTER — Other Ambulatory Visit: Payer: Self-pay | Admitting: Family Medicine

## 2024-06-28 DIAGNOSIS — N9489 Other specified conditions associated with female genital organs and menstrual cycle: Secondary | ICD-10-CM

## 2024-06-28 MED ORDER — FLUCONAZOLE 150 MG PO TABS
ORAL_TABLET | ORAL | 0 refills | Status: DC
Start: 2024-06-28 — End: 2024-10-04

## 2024-06-28 NOTE — Telephone Encounter (Signed)
 Sent in Diflucan  to her pharmacy. If symptoms persist after medication, recommend appt for further investigation. Continue topical nystatin  to groin.

## 2024-07-07 ENCOUNTER — Ambulatory Visit: Admitting: Physician Assistant

## 2024-07-13 ENCOUNTER — Encounter: Payer: Medicare Other | Admitting: Family Medicine

## 2024-07-18 ENCOUNTER — Other Ambulatory Visit (HOSPITAL_COMMUNITY)

## 2024-07-18 ENCOUNTER — Other Ambulatory Visit: Payer: Medicare Other

## 2024-07-18 ENCOUNTER — Encounter (HOSPITAL_COMMUNITY)

## 2024-07-28 ENCOUNTER — Ambulatory Visit: Admitting: Family Medicine

## 2024-08-07 ENCOUNTER — Encounter: Payer: Self-pay | Admitting: Family Medicine

## 2024-08-07 ENCOUNTER — Other Ambulatory Visit: Payer: Self-pay | Admitting: Family Medicine

## 2024-08-07 DIAGNOSIS — L304 Erythema intertrigo: Secondary | ICD-10-CM

## 2024-08-07 MED ORDER — NYSTATIN 100000 UNIT/GM EX CREA: 1.0000 | TOPICAL_CREAM | Freq: Two times a day (BID) | CUTANEOUS | 2 refills | Status: AC

## 2024-08-07 NOTE — Telephone Encounter (Signed)
 Refilled for patient.

## 2024-08-08 ENCOUNTER — Other Ambulatory Visit

## 2024-08-08 ENCOUNTER — Encounter

## 2024-08-15 DIAGNOSIS — D2262 Melanocytic nevi of left upper limb, including shoulder: Secondary | ICD-10-CM | POA: Diagnosis not present

## 2024-08-15 DIAGNOSIS — D485 Neoplasm of uncertain behavior of skin: Secondary | ICD-10-CM | POA: Diagnosis not present

## 2024-08-15 DIAGNOSIS — L570 Actinic keratosis: Secondary | ICD-10-CM | POA: Diagnosis not present

## 2024-08-15 DIAGNOSIS — D0439 Carcinoma in situ of skin of other parts of face: Secondary | ICD-10-CM | POA: Diagnosis not present

## 2024-08-15 DIAGNOSIS — C44329 Squamous cell carcinoma of skin of other parts of face: Secondary | ICD-10-CM | POA: Diagnosis not present

## 2024-08-15 DIAGNOSIS — C44319 Basal cell carcinoma of skin of other parts of face: Secondary | ICD-10-CM | POA: Diagnosis not present

## 2024-08-15 DIAGNOSIS — D2261 Melanocytic nevi of right upper limb, including shoulder: Secondary | ICD-10-CM | POA: Diagnosis not present

## 2024-08-15 DIAGNOSIS — D2272 Melanocytic nevi of left lower limb, including hip: Secondary | ICD-10-CM | POA: Diagnosis not present

## 2024-08-15 DIAGNOSIS — D225 Melanocytic nevi of trunk: Secondary | ICD-10-CM | POA: Diagnosis not present

## 2024-08-21 ENCOUNTER — Ambulatory Visit

## 2024-08-31 ENCOUNTER — Encounter: Payer: Self-pay | Admitting: Oncology

## 2024-08-31 ENCOUNTER — Inpatient Hospital Stay: Attending: Oncology | Admitting: Oncology

## 2024-08-31 VITALS — BP 120/68 | HR 87 | Temp 96.0°F | Resp 18 | Ht 62.0 in | Wt 117.0 lb

## 2024-08-31 DIAGNOSIS — Z862 Personal history of diseases of the blood and blood-forming organs and certain disorders involving the immune mechanism: Secondary | ICD-10-CM | POA: Insufficient documentation

## 2024-08-31 DIAGNOSIS — Z85038 Personal history of other malignant neoplasm of large intestine: Secondary | ICD-10-CM | POA: Diagnosis not present

## 2024-08-31 DIAGNOSIS — Z86711 Personal history of pulmonary embolism: Secondary | ICD-10-CM | POA: Diagnosis not present

## 2024-08-31 DIAGNOSIS — D509 Iron deficiency anemia, unspecified: Secondary | ICD-10-CM | POA: Diagnosis not present

## 2024-08-31 NOTE — Progress Notes (Signed)
 [facility]  Telephone:([phone] [phone]) [phone]  ID: [name] Fire OB: December 15, 1928  MR#: [id number]  RDW#:[id number]  Patient Care Team: Wellington Curtis LABOR, FNP as PCP - General (Family Medicine) Myrna Adine Anes, MD as Consulting Physician (Ophthalmology) Dasher, Alm LABOR, MD (Dermatology) Jacobo Evalene PARAS, MD as Consulting Physician (Oncology)  CHIEF COMPLAINT: History of stage I colon cancer, history left lower lobe lung nodule presumed malignant, iron  deficiency anemia secondary to diverticular bleed.  INTERVAL HISTORY: Patient returns to clinic today for further evaluation.  She declined any further imaging or laboratory work at this time.  She continues to have intermittent abdominal pain, but it only occurs in the morning and usually resolves soon after waking up.  She otherwise feels well.  She has no neurologic complaints.  She denies any recent fevers or illnesses.  She has a good appetite and denies weight loss.  She has no chest pain, shortness of breath, cough, or hemoptysis.  She denies any nausea, vomiting, constipation, or diarrhea.  She has no melena or hematochezia.  She has no urinary complaints.  Patient offers no further specific complaints today.  REVIEW OF SYSTEMS:   Review of Systems  Constitutional: Negative.  Negative for fever, malaise/fatigue and weight loss.  Respiratory: Negative.  Negative for cough, hemoptysis and shortness of breath.   Cardiovascular: Negative.  Negative for chest pain and leg swelling.  Gastrointestinal: Negative.  Negative for abdominal pain, blood in stool and melena.  Genitourinary: Negative.  Negative for hematuria.  Musculoskeletal: Negative.  Negative for back pain.  Skin: Negative.  Negative for rash.  Neurological: Negative.  Negative for dizziness, focal weakness, weakness and headaches.  Psychiatric/Behavioral: Negative.  Negative for depression. The patient is not nervous/anxious and does not have  insomnia.     As per HPI. Otherwise, a complete review of systems is negative.  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   a.) CT chest 03/09/2023: fusiform dilatation measuring 4.3 cm   Basal cell carcinoma (BCC) of left medial cheek    a.) s/p Mohs surgery 07/13/2022 at [facility]   Bilateral carotid artery disease    a.) doppler 04/03/2007: 50-75% RICA; b.) s/p LEFT CEA with CorMatrix patch angioplasty 01/18/2015; c.) doppler 11/02/2016: 40-59% RICA; d.) doppler 01/10/2018: 1-39% RICA   Diverticulosis 08/05/2015   Eczema of both upper extremities 06/10/2021   Grade I diastolic dysfunction    Heart failure with preserved left ventricular function (HFpEF) (HCC)    a.) TTE 05/17/2017: EF >55%, mild LVH, mild LAE, mild panval regurg; b.) TTE 06/27/2018 : EF >55%, mod LVH, mild LAE, mild panval regurg'; G1dd; c.) TTE 12/07/2019: EF >55%, mild LVH, mild MAC, mild panval regurg; d.) TTE 01/27/2021: EF >55%, mod LVH, triv TR, mild AR/MR, G1DD   Heart murmur    History of right cataract extraction    Hyperlipidemia    Hypertension    Insomnia    a.) on BZO (temazepam ) PRN   Irritable bowel syndrome with  diarrhea 04/20/2017   LBP (low back pain) 08/05/2015   Melanoma (HCC)    a.) s/p Mohs surgery 02/06/2020 - LEFT medial brow --> V to Y advancement flap   Melena    Mild mitral regurgitation by prior echocardiogram    Moderate aortic stenosis by prior echocardiogram    Non-small cell lung cancer (NSCLC) (HCC) 03/09/2023   a.) CT  chest 03/09/2023 --> superior segment LLL --> 2.1 x 1.7 x 2.1 cm mixed ground-glass and subpleural nodule extending to pleura with irreg margins --> concerning for primary bronchogenic carcinoma rather than metastatic disease; b.) 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 (60 Gy over 6 fractions)   OAB (overactive bladder)    PONV (postoperative nausea and vomiting)    a.) single episode following cataract surgery   PONV (postoperative nausea and vomiting)    PSVT (paroxysmal supraventricular tachycardia) 02/22/2015   a.) holter 02/22/2015: 6 runs with longest lasting 10 beats   Pulmonary embolism (HCC) 11/2023   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   a.) MRI brain 04/02/2007 - subacute RIGHT pontine CVA   Stroke (HCC) 12/16/2014   a.) MRI brain 12/16/2014: punctate foci of restricted diffusion x 5 consistent with microembolic infarctions affecting the RIGHT lateral medulla, LEFT temporal lobe, 2 foci in the RIGHT thalamus, and LEFT inferior  frontal lobe/basal ganglia --> distribution would suggest cardiac or ascending aortic source   T2DM (type 2 diabetes mellitus) (HCC)    Tubular adenoma of colon    Vertigo 07/2023    PAST SURGICAL HISTORY: Past Surgical History:  Procedure Laterality Date   APPENDECTOMY     BREAST BIOPSY Right    neg   BREAST SURGERY     1980's   CAROTID ENDARTERECTOMY Left 01/18/2015   Procedure: CAROTID ENDARTERECTOMY; Location: [facility]; Surgeon: Cordella Shawl, MD   CATARACT EXTRACTION Right 2012   CATARACT EXTRACTION W/PHACO Left 03/13/2024   Procedure: PHACOEMULSIFICATION, CATARACT, WITH IOL  13.82 01:22.7;  Surgeon: Myrna Adine Anes, MD;  [facility];  Service: Ophthalmology;  Laterality: Left;   CHOLECYSTECTOMY  06/27/2012   COLONOSCOPY N/A 10/05/2023   Procedure: COLONOSCOPY;  Surgeon: Toledo, Ladell POUR, MD;  Location: [facility] ENDOSCOPY;  Service: Gastroenterology;  Laterality: N/A;   COLONOSCOPY WITH PROPOFOL  N/A 02/17/2023   Procedure:  COLONOSCOPY WITH PROPOFOL ;  Surgeon: Maryruth Ole DASEN, MD;  Location: [facility] ENDOSCOPY;  Service: Endoscopy;  Laterality: N/A;   DECOMPRESSIVE LUMBAR LAMINECTOMY LEVEL 1  1950   ESOPHAGEAL BRUSHING  10/03/2023   Procedure: ESOPHAGEAL BRUSHING;  Surgeon: Aundria, Ladell POUR, MD;  Location: [facility] ENDOSCOPY;  Service: Gastroenterology;;   ESOPHAGOGASTRODUODENOSCOPY N/A 10/03/2023   Procedure: ESOPHAGOGASTRODUODENOSCOPY (EGD);  Surgeon: Toledo, Ladell POUR, MD;  Location: [facility] ENDOSCOPY;  Service: Gastroenterology;  Laterality: N/A;   ESOPHAGOGASTRODUODENOSCOPY (EGD) WITH PROPOFOL  N/A 02/16/2023   Procedure: ESOPHAGOGASTRODUODENOSCOPY (EGD) WITH PROPOFOL ;  Surgeon: Maryruth Ole DASEN, MD;  Location: [facility] ENDOSCOPY;  Service: Endoscopy;  Laterality: N/A;   IVC FILTER INSERTION N/A 12/08/2023   Procedure: IVC FILTER INSERTION;  Surgeon: Shawl Cordella MATSU, MD;  Location: [facility] INVASIVE CV LAB;  Service: Cardiovascular;  Laterality: N/A;   MELANOMA EXCISION Left 02/06/2020   Procedure: MOHS SURGERY WITH V TO Y ADVANCEMENT FLAP (LEFT MEDIAL EYEBROW); Location: [facility]   MOHS SURGERY Left 07/13/2022   Procedure: MOHS SURGERY (BCC LEFT MEDIAL CHEEK); Location: [facility]   PULMONARY THROMBECTOMY Bilateral 12/08/2023   Procedure: PULMONARY THROMBECTOMY;  Surgeon: Shawl Cordella MATSU, MD;  Location: [facility] INVASIVE CV LAB;  Service: Cardiovascular;  Laterality: Bilateral;    FAMILY HISTORY: Family History  Problem Relation Age of Onset   Ovarian cancer Mother    Heart disease Brother    Heart attack Brother    Gout Brother    Alzheimer's disease Brother    Dementia Brother    Heart attack Brother    Diabetes Daughter    Melanoma Daughter    Stroke Son    Breast cancer Neg Hx     ADVANCED DIRECTIVES (Y/N):  N  HEALTH MAINTENANCE: Social History   Tobacco Use   Smoking status: Former    Current packs/day: 0.00    Average packs/day: 0.5 packs/day for 6.0 years (3.0 ttl pk-yrs)    Types: Cigarettes    Start  date: 25    Quit date: 11/15/1976    Years since quitting: 47.8   Smokeless tobacco: Never  Vaping Use   Vaping status: Never Used  Substance Use Topics   Alcohol use: Never   Drug use: No     Colonoscopy:  PAP:  Bone density:  Lipid panel:  Allergies  Allergen Reactions   Codeine Nausea Only   Eliquis  [Apixaban ] Other (See Comments)    GI Bleed   Nitrofurantoin Nausea Only   Tramadol Nausea Only   Doxycycline  Nausea Only and Rash   Penicillins Swelling, Rash and Other (See Comments)    Throat swelling    Sulfa Antibiotics Nausea Only and Rash    Current Outpatient Medications  Medication Sig Dispense Refill   atorvastatin  (LIPITOR) 40 MG tablet Take 1 tablet (40 mg total) by mouth daily. 90 tablet 3   cholecalciferol  (VITAMIN D3) 25 MCG (1000 UNIT) tablet Take 1,000 Units by mouth daily.     fluconazole  (DIFLUCAN ) 150 MG tablet Take one tablet today, and second tablet in 72 hours. 2 tablet 0   glipiZIDE  (GLUCOTROL  XL) 5 MG 24 hr tablet Take 1 tablet (5 mg total) by mouth daily with breakfast. 90 tablet 3   nystatin  cream (MYCOSTATIN ) Apply 1 Application topically 2 (two) times daily. To affected area to groin 30 g 2   Polyethylene Glycol 3350  (MIRALAX  PO) Take by mouth.     temazepam  (RESTORIL ) 15 MG capsule TAKE 1 CAPSULE BY MOUTH AT BEDTIME AS NEEDED FOR SLEEP 90 capsule 0   No current facility-administered medications for this visit.    OBJECTIVE: Vitals:   08/31/24 1014  BP: 120/68  Pulse: 87  Resp: 18  Temp: (!) 96 F (35.6 C)  SpO2: 97%      Body mass index is 21.4 kg/m.    ECOG FS:0 - Asymptomatic  General: Well-developed, well-nourished, no acute distress.  Sitting in a wheelchair. Eyes: Pink conjunctiva, anicteric sclera. HEENT: Normocephalic, moist mucous membranes. Lungs: No audible wheezing or coughing. Heart: Regular rate and rhythm. Abdomen: Soft, nontender, no obvious distention. Musculoskeletal: No edema, cyanosis, or clubbing. Neuro:  Alert, answering all questions appropriately. Cranial nerves grossly intact. Skin: No rashes or petechiae noted. Psych: Normal affect.  LAB RESULTS:  Lab Results  Component Value Date   NA 141 06/26/2024   K 4.4 06/26/2024   CL 105 06/26/2024   CO2 22 06/26/2024   GLUCOSE 278 (H) 06/26/2024   BUN 14 06/26/2024   CREATININE 0.84 06/26/2024   CALCIUM  9.8 06/26/2024   PROT 7.4 04/26/2024   ALBUMIN 3.6 04/26/2024   AST 22 04/26/2024   ALT 17 04/26/2024   ALKPHOS 73 04/26/2024   BILITOT 1.0 04/26/2024   GFRNONAA >60 04/26/2024  GFRAA 82 05/14/2020    Lab Results  Component Value Date   WBC 6.3 06/26/2024   NEUTROABS 4.0 05/22/2024   HGB 13.2 06/26/2024   HCT 41.4 06/26/2024   MCV 89 06/26/2024   PLT 132 (L) 06/26/2024   Lab Results  Component Value Date   IRON  45 05/22/2024   TIBC 319 05/22/2024   IRONPCTSAT 14 05/22/2024   Lab Results  Component Value Date   FERRITIN 32 05/22/2024     STUDIES: No results found.   ASSESSMENT: History of stage I colon cancer, history left lower lobe lung nodule presumed malignant, iron  deficiency anemia secondary to diverticular bleed.  PLAN:    Iron  deficiency anemia: Resolved.  Secondary to diverticular bleed.  Patient's most recent hemoglobin and iron  stores were reported within normal limits.  Patient last received treatment on November 04, 2023.  Patient has requested no further lab work or follow-up in the cancer center.  Recommend primary care provider monitor CBC and iron  stores 2-3 times per year and refer patient back if there are any concerns.     Diverticular bleed: Resolved.  Eliquis  has been discontinued.  Continue follow-up with GI as indicated.   Pulmonary embolism: CT scan results from November 23, 2023 reviewed independently with incidental right sided pulmonary embolism.  Repeat CT scan on December 07, 2023 revealed worsening of clot burden.  Patient has IVC filter placed.  Eliquis  has been discontinued given  history of significant GI bleed. Stage I colon cancer: Patient underwent surgical resection on June 21, 2023.  0/13 lymph nodes did not reveal evidence of malignancy.  Given the stage of disease, she did not require any further adjuvant treatment.  Her most recent colonoscopy as above showed no evidence of malignancy. Left lower lobe lung nodule: No biopsy was obtained, but nodule was approximately 2.2 cm and significantly hypermetabolic on PET scan from Apr 01, 2023 therefore presumed malignant.  She underwent XRT completing in July 2024.  CT of the chest without contrast on February 03, 2024 revealed no evidence of recurrent or progressive disease.  Patient has declined any further imaging.     I spent a total of 30 minutes reviewing chart data, face-to-face evaluation with the patient, counseling and coordination of care as detailed above.    Patient expressed understanding and was in agreement with this plan. She also understands that She can call clinic at any time with any questions, concerns, or complaints.    Cancer Staging  Colon cancer [facility]) Staging form: Colon and Rectum, AJCC 8th Edition - Pathologic stage from 06/21/2023: Stage I (pT2, pN0, cM0) - Signed by Jacobo Evalene PARAS, MD on 10/19/2023 Stage prefix: Initial diagnosis Total positive nodes: 0   Evalene PARAS Jacobo, MD   08/31/2024 1:05 PM

## 2024-09-04 ENCOUNTER — Encounter: Payer: Self-pay | Admitting: *Deleted

## 2024-09-15 ENCOUNTER — Encounter: Payer: Self-pay | Admitting: Oncology

## 2024-09-18 ENCOUNTER — Encounter: Payer: Self-pay | Admitting: Family Medicine

## 2024-09-18 DIAGNOSIS — C44319 Basal cell carcinoma of skin of other parts of face: Secondary | ICD-10-CM | POA: Diagnosis not present

## 2024-09-18 DIAGNOSIS — D0439 Carcinoma in situ of skin of other parts of face: Secondary | ICD-10-CM | POA: Diagnosis not present

## 2024-09-19 ENCOUNTER — Other Ambulatory Visit: Payer: Self-pay

## 2024-09-19 DIAGNOSIS — F5101 Primary insomnia: Secondary | ICD-10-CM

## 2024-09-19 MED ORDER — TEMAZEPAM 15 MG PO CAPS: ORAL_CAPSULE | ORAL | 0 refills | Status: DC

## 2024-09-19 NOTE — Telephone Encounter (Signed)
 Sent in refill

## 2024-10-03 ENCOUNTER — Ambulatory Visit: Payer: Self-pay

## 2024-10-03 NOTE — Telephone Encounter (Signed)
 FYI Only or Action Required?: FYI only for provider: appointment scheduled on 10/04/24.  Patient was last seen in primary care on 06/26/2024 by Wellington Curtis LABOR, FNP.  Called Nurse Triage reporting Abdominal Pain.  Symptoms began several days ago.  Interventions attempted: Nothing.  Symptoms are: intermittent abdominal pain (patient points across her abdomen when pointing to the pain), mid left sided back pain moved to left shoulder blade and upper back, belching unchanged.  Triage Disposition: See Physician Within 24 Hours  Patient/caregiver understands and will follow disposition?: Yes           Copied from CRM #[id number]. Topic: Clinical - Red Word Triage >> Oct 03, 2024  3:32 PM Hadassah PARAS wrote: Red Word that prompted transfer to Nurse Triage: worsening stomach discomfort, back pain Reason for Disposition  [1] MODERATE pain (e.g., interferes with normal activities) AND [2] pain comes and goes (cramps) AND [3] present > 24 hours  (Exception: Pain with Vomiting or Diarrhea - see that Guideline.)  Answer Assessment - Initial Assessment Questions Strict ED precautions given, verbalizes understanding.  1. LOCATION: Where does it hurt?      Across the whole abdomen.  2. RADIATION: Does the pain shoot anywhere else? (e.g., chest, back)     No.  3. ONSET: When did the pain begin? (e.g., minutes, hours or days ago)      Couple of days, since Sunday.  4. SUDDEN: Gradual or sudden onset?     Sudden.  5. PATTERN Does the pain come and go, or is it constant?     Comes and goes.  6. SEVERITY: How bad is the pain?  (e.g., Scale 1-10; mild, moderate, or severe)     Patient was complaining of off and on discomfort today, daughter in law estimates a 6-7/10. Not taking any Tylenol  or ibuprofen .  7. RECURRENT SYMPTOM: Have you ever had this type of stomach pain before? If Yes, ask: When was the last time? and What happened that time?      Yes, before her colon  resection (August 2024).  8. CAUSE: What do you think is causing the stomach pain? (e.g., gallstones, recent abdominal surgery)     Arthritis. Daughter in law, Clarita, also mentions patient has a history of diverticulitis.  9. RELIEVING/AGGRAVATING FACTORS: What makes it better or worse? (e.g., antacids, bending or twisting motion, bowel movement)     No.  10. OTHER SYMPTOMS: Do you have any other symptoms? (e.g., back pain, diarrhea, fever, urination pain, vomiting)       Belching (reports patient has not been taking her omeprazole ), left shoulder/upper back pain (moved from mid left side of back up to upper back). Denies any constipation, burning or pain with urination, nausea, vomiting, diarrhea, fever.  Protocols used: Abdominal Pain - Female-A-AH

## 2024-10-04 ENCOUNTER — Ambulatory Visit (INDEPENDENT_AMBULATORY_CARE_PROVIDER_SITE_OTHER): Admitting: Family Medicine

## 2024-10-04 VITALS — BP 128/68 | HR 103

## 2024-10-04 DIAGNOSIS — R142 Eructation: Secondary | ICD-10-CM

## 2024-10-04 DIAGNOSIS — R1084 Generalized abdominal pain: Secondary | ICD-10-CM

## 2024-10-04 MED ORDER — FAMOTIDINE 20 MG PO TABS: 20.0000 mg | ORAL_TABLET | Freq: Every day | ORAL | 1 refills | Status: AC

## 2024-10-04 NOTE — Patient Instructions (Addendum)
 Dr. Evalene Reusing - Oncology - to follow up on when to repeat colonoscopy Phone: [phone]  May take OTC fiber supplement   Start famotidine  20mg  daily

## 2024-10-04 NOTE — Progress Notes (Unsigned)
 Acute Office Visit  Introduced to nurse practitioner role and practice setting.  All questions answered.  Discussed provider/patient relationship and expectations.   Subjective:     Patient ID: [name], female    DOB: 03/10/1929, 89 y.o.   MRN: [id number]  Chief Complaint  Patient presents with  . Acute Visit    intermittent abdominal pain, points across her entire abdomen, upper left shoulder blade/back discomfort. triage RN yesterday // She reports no radiating, present a couple of days since Sunday and symptoms were sudden. She reports they come and go. DIL estimated a pain level of 6-7/10. Reports recurrent symptoms for August of 2024 before her colon resection. Reports possible cause is arthritis and DIL mentions hx of diverticulitis. Associated with belching     Discussed the use of AI scribe software for clinical note transcription with the patient, who gave verbal consent to proceed.  History of Present Illness    History of Present Illness [name] is a 89 year old female who presents with abdominal pain and frequent bowel movements.  She experiences abdominal pain primarily in the morning upon waking, which resolves after she gets up and moves around. The pain is described as a sharp sensation across her abdomen. Recently, the pain was more pronounced, lasting all day, but typically subsides after she becomes active.  She has frequent bowel movements, sometimes up to four times a day, with varying stool consistency from formed to liquid. She takes MiraLax  every night to prevent constipation due to her medical history.  She has been wearing compression socks for twelve hours a day since last year, as recommended during a previous hospitalization, but does not recall being told she had a DVT. She does not recall having any swelling or clots in her legs. She was hospitalized multiple times last year and had an IVC filter placed, which remains in situ.  She reports  frequent burping, which occurs at any time, including immediately after eating. She has not previously taken any medication for gastric reflux.  Her current medications include atorvastatin  for cholesterol, a sleep aid, vitamin D , and a medication for diabetes. She no longer takes blood pressure medications as they were discontinued due to low blood pressure.  She has concerns about her memory, noting frequent forgetfulness, such as leaving the stove on, which has led her family to restrict her from using it. She lives alone but receives assistance from her daughter-in-law, Clarita, and her daughter, Nathanel, who help with meals and appointments.  Physical Exam MEASUREMENTS: Weight- 156.  Results LABS Hemoglobin A1c: controlled  Assessment and Plan Generalized abdominal pain and variable bowel habits Intermittent abdominal pain, particularly in the morning, resolving after activity. Variable bowel habits with frequent bowel movements, sometimes liquid and sometimes formed. No constipation reported, but MiraLax  is taken daily. No bloody stools reported. - Ordered abdominal ultrasound to assess for any changes. - Ordered blood work to evaluate diabetes control and other potential causes of symptoms. - Recommended fiber supplement such as Metamucil to bulk up stool.  Gastroesophageal reflux symptoms (eructation) Frequent burping, possibly related to gastric reflux. Symptoms occur at various times, not specifically after meals. - Prescribed famotidine  20 mg daily to manage reflux symptoms.  Type 2 diabetes mellitus Diabetes management may need adjustment based on current symptoms and bowel habits. Previous A1c was controlled, but current status is unknown. - Ordered blood work to assess current A1c and diabetes control. - Will consider adjusting glipizide  dosage based on lab  results.  History of colon cancer, post-partial colectomy Colon cancer with partial colectomy. No recent follow-up with  gastroenterology or oncology noted. Uncertain about the need for repeat colonoscopy given age and history. - Recommended contacting oncologist to discuss follow-up and potential need for repeat colonoscopy.  History of deep vein thrombosis, status post IVC filter DVT with IVC filter placement. Compression stockings recommended to prevent clot formation. No recent follow-up with vascular surgery noted. - Continue wearing compression stockings for 12 hours daily. - Recommended follow-up with vascular surgery if not already scheduled.  Heart murmur Known heart murmur with normal heart rate. No acute cardiac symptoms reported.  Memory impairment Difficulty remembering appointments and medication details. No acute interventions discussed. - Recommended using a to-do list to help manage daily tasks and appointments.  Hyperlipidemia Currently managed with atorvastatin . - Continue atorvastatin  as prescribed.  HPI  ROS      Objective:    BP 128/68 (BP Location: Left Arm, Patient Position: Sitting, Cuff Size: Normal)   Pulse (!) 103   SpO2 98%  {Vitals History (Optional):23777}  Physical Exam  No results found for any visits on 10/04/24.      Assessment & Plan:  Assessment and Plan Assessment & Plan      Problem List Items Addressed This Visit   None   No orders of the defined types were placed in this encounter.   No follow-ups on file.  Curtis DELENA Boom, FNP  I, Curtis DELENA Boom, FNP, have reviewed all documentation for this visit. The documentation on 10/04/24 for the exam, diagnosis, procedures, and orders are all accurate and complete.

## 2024-10-05 ENCOUNTER — Ambulatory Visit: Payer: Self-pay | Admitting: Family Medicine

## 2024-10-05 ENCOUNTER — Telehealth: Payer: Self-pay

## 2024-10-05 ENCOUNTER — Encounter: Payer: Self-pay | Admitting: Family Medicine

## 2024-10-05 LAB — CBC
Hematocrit: 43.3 % (ref 34.0–46.6)
Hemoglobin: 13.7 g/dL (ref 11.1–15.9)
MCH: 28.4 pg (ref 26.6–33.0)
MCHC: 31.6 g/dL (ref 31.5–35.7)
MCV: 90 fL (ref 79–97)
Platelets: 199 x10E3/uL (ref 150–450)
RBC: 4.82 x10E6/uL (ref 3.77–5.28)
RDW: 12.4 % (ref 11.7–15.4)
WBC: 9.5 x10E3/uL (ref 3.4–10.8)

## 2024-10-05 LAB — TSH: TSH: 1.29 u[IU]/mL (ref 0.450–4.500)

## 2024-10-05 LAB — COMPREHENSIVE METABOLIC PANEL WITH GFR
ALT: 22 IU/L (ref 0–32)
AST: 21 IU/L (ref 0–40)
Albumin: 4.2 g/dL (ref 3.6–4.6)
Alkaline Phosphatase: 115 IU/L (ref 48–129)
BUN/Creatinine Ratio: 23 (ref 12–28)
BUN: 19 mg/dL (ref 10–36)
Bilirubin Total: 0.7 mg/dL (ref 0.0–1.2)
CO2: 20 mmol/L (ref 20–29)
Calcium: 9.5 mg/dL (ref 8.7–10.3)
Chloride: 100 mmol/L (ref 96–106)
Creatinine, Ser: 0.83 mg/dL (ref 0.57–1.00)
Globulin, Total: 2.8 g/dL (ref 1.5–4.5)
Glucose: 278 mg/dL — ABNORMAL HIGH (ref 70–99)
Potassium: 4.5 mmol/L (ref 3.5–5.2)
Sodium: 139 mmol/L (ref 134–144)
Total Protein: 7 g/dL (ref 6.0–8.5)
eGFR: 66 mL/min/1.73 (ref >59)

## 2024-10-05 LAB — HEMOGLOBIN A1C
Est. average glucose Bld gHb Est-mCnc: 177 mg/dL
Hgb A1c MFr Bld: 7.8 % — ABNORMAL HIGH (ref 4.8–5.6)

## 2024-10-05 LAB — LIPASE: Lipase: 31 U/L (ref 14–85)

## 2024-10-05 LAB — AMYLASE: Amylase: 75 U/L (ref 31–110)

## 2024-10-05 NOTE — Telephone Encounter (Signed)
 Copied from CRM #[id number]. Topic: Clinical - Request for Lab/Test Order >> Oct 04, 2024  3:38 PM Anairis L wrote: Reason for CRM: Leotis calling from [facility] DX is not sufficient please add location or what is provider wanting to asses for.  [phone] option 1 follow by 5

## 2024-10-06 ENCOUNTER — Other Ambulatory Visit: Payer: Self-pay | Admitting: Family Medicine

## 2024-10-06 DIAGNOSIS — R1084 Generalized abdominal pain: Secondary | ICD-10-CM

## 2024-10-06 DIAGNOSIS — K529 Noninfective gastroenteritis and colitis, unspecified: Secondary | ICD-10-CM

## 2024-10-06 NOTE — Telephone Encounter (Signed)
 Placed ct instead - order in

## 2024-10-06 NOTE — Telephone Encounter (Signed)
 Updated diagnosis and comment in order for imaging

## 2024-10-09 NOTE — Telephone Encounter (Signed)
 Called and spoke with Clarita Lee (on signed DPR) and advised of Kellie Clrfton's message. She is going to speak with Ms. Chessica and will message us  through my chart to let us  know if patient stops the Temazepam  or decrease the dose.

## 2024-10-17 ENCOUNTER — Telehealth: Payer: Self-pay

## 2024-10-17 DIAGNOSIS — R1084 Generalized abdominal pain: Secondary | ICD-10-CM

## 2024-10-17 DIAGNOSIS — K529 Noninfective gastroenteritis and colitis, unspecified: Secondary | ICD-10-CM

## 2024-10-17 NOTE — Telephone Encounter (Signed)
 Please advise.  Copied from CRM #[id number]. Topic: Clinical - Request for Lab/Test Order >> Oct 17, 2024  9:52 AM Selinda RAMAN wrote: Reason for CRM: Janella with [facility] called stating the system shows that Curtis Boom is not an authorized provider so another provider needs to be listed on the order for the CT Abd/Pelvis so the patient can be scheduled. Please assist patient further by adding another provider to the order so the patient can be scheduled. Any questions please contact Janella at [phone]

## 2024-10-17 NOTE — Telephone Encounter (Signed)
 Yes, you can add me on the order. Thanks!

## 2024-10-18 NOTE — Telephone Encounter (Signed)
 Spoke with janella. A new order was placed under Dr. Franchot name.

## 2024-10-19 DIAGNOSIS — C44319 Basal cell carcinoma of skin of other parts of face: Secondary | ICD-10-CM | POA: Diagnosis not present

## 2024-10-20 ENCOUNTER — Other Ambulatory Visit: Payer: Self-pay

## 2024-10-20 ENCOUNTER — Emergency Department
Admission: EM | Admit: 2024-10-20 | Discharge: 2024-10-20 | Disposition: A | Attending: Emergency Medicine | Admitting: Emergency Medicine

## 2024-10-20 ENCOUNTER — Encounter: Payer: Self-pay | Admitting: Emergency Medicine

## 2024-10-20 DIAGNOSIS — Z7984 Long term (current) use of oral hypoglycemic drugs: Secondary | ICD-10-CM | POA: Insufficient documentation

## 2024-10-20 DIAGNOSIS — L7622 Postprocedural hemorrhage and hematoma of skin and subcutaneous tissue following other procedure: Secondary | ICD-10-CM | POA: Diagnosis not present

## 2024-10-20 DIAGNOSIS — Z85038 Personal history of other malignant neoplasm of large intestine: Secondary | ICD-10-CM | POA: Diagnosis not present

## 2024-10-20 DIAGNOSIS — T148XXA Other injury of unspecified body region, initial encounter: Secondary | ICD-10-CM

## 2024-10-20 DIAGNOSIS — I110 Hypertensive heart disease with heart failure: Secondary | ICD-10-CM | POA: Insufficient documentation

## 2024-10-20 DIAGNOSIS — I5030 Unspecified diastolic (congestive) heart failure: Secondary | ICD-10-CM | POA: Diagnosis not present

## 2024-10-20 MED ORDER — TRANEXAMIC ACID FOR EPISTAXIS
500.0000 mg | Freq: Once | TOPICAL | Status: AC
  Administered 2024-10-20: 500 mg via TOPICAL
  Filled 2024-10-20: qty 10

## 2024-10-20 MED ORDER — IBUPROFEN 400 MG PO TABS
400.0000 mg | ORAL_TABLET | Freq: Once | ORAL | Status: AC | PRN
  Administered 2024-10-20: 400 mg via ORAL
  Filled 2024-10-20: qty 1

## 2024-10-20 NOTE — Discharge Instructions (Signed)
 If you begin bleeding again at home I recommend holding direct pressure to this area for 30 minutes.  If this does not stop your bleeding, please follow-up with your surgeon or return to the emergency department.

## 2024-10-20 NOTE — ED Provider Notes (Signed)
 [facility] Provider Note    Event Date/Time   First MD Initiated Contact with Patient 10/20/24 0155     (approximate)   History   Post-op Problem   HPI  [name] is a 89 y.o. female with history of heart failure, hypertension, hyperlipidemia, melanoma, PE and stroke not currently on antiplatelets or anticoagulants who presents to the emergency department with complaints of bleeding from a surgical site where she had a skin cancer removed as an outpatient.  States that she noticed it was dripping onto her clothes tonight.  States she called her doctor who recommended she change her dressing.  No fevers or purulent drainage.   History provided by patient, family.    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) doppler 04/03/2007: 50-75% RICA; b.) s/p LEFT CEA with CorMatrix patch angioplasty 01/18/2015; c.) doppler 11/02/2016: 40-59% RICA; d.) doppler 01/10/2018: 1-39% RICA   Diverticulosis 08/05/2015   Eczema of both upper extremities 06/10/2021   Grade I diastolic dysfunction    Heart failure with preserved left ventricular function (HFpEF) (HCC)    a.) TTE 05/17/2017: EF >55%, mild LVH, mild LAE, mild panval regurg; b.) TTE 06/27/2018 : EF >55%, mod LVH, mild LAE, mild panval regurg'; G1dd; c.) TTE 12/07/2019: EF  >55%, mild LVH, mild MAC, mild panval regurg; d.) TTE 01/27/2021: EF >55%, mod LVH, triv TR, mild AR/MR, G1DD   Heart murmur    History of right cataract extraction    Hyperlipidemia    Hypertension    Insomnia    a.) on BZO (temazepam ) PRN   Irritable bowel syndrome with diarrhea 04/20/2017   LBP (low back pain) 08/05/2015   Melanoma (HCC)    a.) s/p Mohs surgery 02/06/2020 - LEFT medial brow --> V to Y advancement flap   Melena    Mild mitral regurgitation by prior echocardiogram    Moderate aortic stenosis by prior echocardiogram    Non-small cell lung cancer (NSCLC) (HCC) 03/09/2023   a.) CT chest 03/09/2023 --> superior segment LLL --> 2.1 x 1.7 x 2.1 cm mixed ground-glass and subpleural nodule extending to pleura with irreg margins --> concerning for primary bronchogenic carcinoma rather than metastatic disease; b.) 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 (60 Gy over 6 fractions)   OAB (overactive bladder)    PONV (postoperative nausea and vomiting)    a.) single episode following cataract surgery   PONV (postoperative nausea and vomiting)    PSVT (paroxysmal supraventricular tachycardia) 02/22/2015   a.) holter 02/22/2015: 6 runs with longest lasting 10 beats   Pulmonary embolism (HCC) 11/2023   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   a.) MRI brain 04/02/2007 - subacute RIGHT pontine CVA   Stroke (HCC) 12/16/2014   a.) MRI  brain 12/16/2014: punctate foci of restricted diffusion x 5 consistent with microembolic infarctions affecting the RIGHT lateral medulla, LEFT temporal lobe, 2 foci in the RIGHT thalamus, and LEFT inferior  frontal lobe/basal ganglia --> distribution would suggest cardiac or ascending aortic source   T2DM (type 2 diabetes mellitus) (HCC)    Tubular adenoma of colon    Vertigo 07/2023    Past Surgical History:  Procedure Laterality Date   APPENDECTOMY     BREAST BIOPSY Right    neg   BREAST SURGERY     1980's   CAROTID ENDARTERECTOMY Left 01/18/2015    Procedure: CAROTID ENDARTERECTOMY; Location: [facility]; Surgeon: Cordella Shawl, MD   CATARACT EXTRACTION Right 2012   CATARACT EXTRACTION W/PHACO Left 03/13/2024   Procedure: PHACOEMULSIFICATION, CATARACT, WITH IOL  13.82 01:22.7;  Surgeon: Myrna Adine Anes, MD;  Location: [facility];  Service: Ophthalmology;  Laterality: Left;   CHOLECYSTECTOMY  06/27/2012   COLONOSCOPY N/A 10/05/2023   Procedure: COLONOSCOPY;  Surgeon: Toledo, Ladell POUR, MD;  Location: [facility];  Service: Gastroenterology;  Laterality: N/A;   COLONOSCOPY WITH PROPOFOL  N/A 02/17/2023   Procedure: COLONOSCOPY WITH PROPOFOL ;  Surgeon: Maryruth Ole DASEN, MD;  Location: [facility];  Service: Endoscopy;  Laterality: N/A;   DECOMPRESSIVE LUMBAR LAMINECTOMY LEVEL 1  1950   ESOPHAGEAL BRUSHING  10/03/2023   Procedure: ESOPHAGEAL BRUSHING;  Surgeon: Aundria, Ladell POUR, MD;  Location: [facility];  Service: Gastroenterology;;   ESOPHAGOGASTRODUODENOSCOPY N/A 10/03/2023   Procedure: ESOPHAGOGASTRODUODENOSCOPY (EGD);  Surgeon: Toledo, Ladell POUR, MD;  Location: [facility];  Service: Gastroenterology;  Laterality: N/A;   ESOPHAGOGASTRODUODENOSCOPY (EGD) WITH PROPOFOL  N/A 02/16/2023   Procedure: ESOPHAGOGASTRODUODENOSCOPY (EGD) WITH PROPOFOL ;  Surgeon: Maryruth Ole DASEN, MD;  Location: [facility];  Service: Endoscopy;  Laterality: N/A;   IVC FILTER INSERTION N/A 12/08/2023   Procedure: IVC FILTER INSERTION;  Surgeon: Shawl Cordella MATSU, MD;  Location: [facility];  Service: Cardiovascular;  Laterality: N/A;   MELANOMA EXCISION Left 02/06/2020   Procedure: MOHS SURGERY WITH V TO Y ADVANCEMENT FLAP (LEFT MEDIAL EYEBROW); Location: [facility]   MOHS SURGERY Left 07/13/2022   Procedure: MOHS SURGERY (BCC LEFT MEDIAL CHEEK); Location: [facility]   PULMONARY THROMBECTOMY Bilateral 12/08/2023   Procedure: PULMONARY THROMBECTOMY;  Surgeon: Shawl Cordella MATSU, MD;  Location: [facility];  Service: Cardiovascular;   Laterality: Bilateral;    MEDICATIONS:  Prior to Admission medications   Medication Sig Start Date End Date Taking? Authorizing Provider  atorvastatin  (LIPITOR) 40 MG tablet Take 1 tablet (40 mg total) by mouth daily. 06/26/24   Wellington Curtis LABOR, FNP  cholecalciferol  (VITAMIN D3) 25 MCG (1000 UNIT) tablet Take 1,000 Units by mouth daily.    [provider]  famotidine  (PEPCID ) 20 MG tablet Take 1 tablet (20 mg total) by mouth daily. 10/04/24   Wellington Curtis LABOR, FNP  glipiZIDE  (GLUCOTROL  XL) 5 MG 24 hr tablet Take 1 tablet (5 mg total) by mouth daily with breakfast. 06/26/24   Clifton, Kellie A, FNP  nystatin  cream (MYCOSTATIN ) Apply 1 Application topically 2 (two) times daily. To affected area to groin 08/07/24   Wellington Curtis A, FNP  Polyethylene Glycol 3350  (MIRALAX  PO) Take by mouth.    [provider]  temazepam  (RESTORIL ) 15 MG capsule TAKE 1 CAPSULE BY MOUTH AT BEDTIME AS NEEDED FOR SLEEP 09/19/24   Wellington Curtis LABOR, FNP    Physical Exam   Triage Vital Signs: ED Triage Vitals [10/20/24 0014]  Encounter Vitals Group  BP (!) 154/95     Girls Systolic BP Percentile      Girls Diastolic BP Percentile      Boys Systolic BP Percentile      Boys Diastolic BP Percentile      Pulse Rate 82     Resp 18     Temp 97.6 F (36.4 C)     Temp Source Oral     SpO2 97 %     Weight      Height      Head Circumference      Peak Flow      Pain Score 8     Pain Loc      Pain Education      Exclude from Growth Chart     Most recent vital signs: Vitals:   10/20/24 0014  BP: (!) 154/95  Pulse: 82  Resp: 18  Temp: 97.6 F (36.4 C)  SpO2: 97%     CONSTITUTIONAL: Alert and responds appropriately to questions. Well-appearing; well-nourished HEAD: Normocephalic, atraumatic, superficial excision site to the left forehead with 1 small area of minimal oozing, no surrounding redness or warmth, no tenderness, no fluctuance, no purulent drainage EYES: Conjunctivae  clear, pupils appear equal ENT: normal nose; moist mucous membranes NECK: Normal range of motion CARD: Regular rate and rhythm RESP: Normal chest excursion without splinting or tachypnea; no hypoxia or respiratory distress, speaking full sentences ABD/GI: non-distended EXT: Normal ROM in all joints, no major deformities noted SKIN: Normal color for age and race, no rashes on exposed skin NEURO: Moves all extremities equally, normal speech, no facial asymmetry noted PSYCH: The patient's mood and manner are appropriate. Grooming and personal hygiene are appropriate.  ED Results / Procedures / Treatments   LABS: (all labs ordered are listed, but only abnormal results are displayed) Labs Reviewed - No data to display   EKG:   RADIOLOGY: My personal review and interpretation of imaging:    I have personally reviewed all radiology reports. No results found.   PROCEDURES:  Critical Care performed: No    Procedures    IMPRESSION / MDM / ASSESSMENT AND PLAN / ED COURSE  I reviewed the triage vital signs and the nursing notes.   Patient here with bleeding wound.  Stopped after pressure dressing in the waiting room.     DIFFERENTIAL DIAGNOSIS (includes but not limited to):   Bleeding wound, doubt anemia, coagulopathy, no sign of infection  Patient's presentation is most consistent with acute complicated illness / injury requiring diagnostic workup.  PLAN: Patient here with a bleeding wound.  Bleeding has stopped after pressure dressing in the waiting room.  Now only 1 very minimal area of oozing when I remove the pressure dressing.  Will place TXA soaked gauze to this area and reapply dressing.  Anticipate discharge home.   MEDICATIONS GIVEN IN ED: Medications  ibuprofen  (ADVIL ) tablet 400 mg (400 mg Oral Given 10/20/24 0027)  tranexamic acid  (CYKLOKAPRON ) 1000 MG/10ML topical solution 500 mg (500 mg Topical Given 10/20/24 0231)     ED COURSE: No further bleeding after  new dressing has been placed.  Will discharge home to follow-up with her surgeon as needed.  Discussed bleeding return precautions and supportive care instructions.  At this time, I do not feel there is any life-threatening condition present. I reviewed all nursing notes, vitals, pertinent previous records.  All lab and urine results, EKGs, imaging ordered have been independently reviewed and interpreted by myself.  I reviewed all  available radiology reports from any imaging ordered this visit.  Based on my assessment, I feel the patient is safe to be discharged home without further emergent workup and can continue workup as an outpatient as needed. Discussed all findings, treatment plan as well as usual and customary return precautions.  They verbalize understanding and are comfortable with this plan.  Outpatient follow-up has been provided as needed.  All questions have been answered.    CONSULTS:  none   OUTSIDE RECORDS REVIEWED: Reviewed recent family medicine notes.     FINAL CLINICAL IMPRESSION(S) / ED DIAGNOSES   Final diagnoses:  Bleeding from wound     Rx / DC Orders   ED Discharge Orders     None        Note:  This document was prepared using Dragon voice recognition software and may include unintentional dictation errors.   Tailynn Armetta, Josette SAILOR, DO 10/20/24 [phone]

## 2024-10-20 NOTE — ED Triage Notes (Addendum)
 Pt arrives POV ambulatory to triage, gait steady. Pt reports having  skin cancer removed this morning. Pt reports the site began bleeding and has soiled through dressing and was told to come here to have dressing changed. Soiled dressing removed and pressure dressing applied around head during triage. Denies blood thinners

## 2024-12-14 DIAGNOSIS — F5101 Primary insomnia: Secondary | ICD-10-CM

## 2024-12-14 MED ORDER — TEMAZEPAM 15 MG PO CAPS: 15.0000 mg | ORAL_CAPSULE | Freq: Every evening | ORAL | 0 refills | Status: AC | PRN

## 2024-12-27 ENCOUNTER — Encounter: Admitting: Physician Assistant

## 2024-12-27 ENCOUNTER — Ambulatory Visit: Admitting: Family Medicine

## 2025-01-02 ENCOUNTER — Encounter

## 2025-03-28 ENCOUNTER — Encounter
